# Patient Record
Sex: Male | Born: 1940 | Race: Black or African American | Hispanic: No | Smoking: Never smoker
Health system: Southern US, Community
[De-identification: ages and names within clinical notes are randomized; demographics above are authoritative.]

## PROBLEM LIST (undated history)

## (undated) ENCOUNTER — Emergency Department (HOSPITAL_COMMUNITY)
Admission: EM | Payer: Medicare Other | Source: Home / Self Care | Attending: Emergency Medicine | Admitting: Emergency Medicine

## (undated) DIAGNOSIS — E785 Hyperlipidemia, unspecified: Secondary | ICD-10-CM

## (undated) DIAGNOSIS — I5020 Unspecified systolic (congestive) heart failure: Secondary | ICD-10-CM

## (undated) DIAGNOSIS — IMO0001 Reserved for inherently not codable concepts without codable children: Secondary | ICD-10-CM

## (undated) DIAGNOSIS — Z8701 Personal history of pneumonia (recurrent): Secondary | ICD-10-CM

## (undated) DIAGNOSIS — I480 Paroxysmal atrial fibrillation: Secondary | ICD-10-CM

## (undated) DIAGNOSIS — R7303 Prediabetes: Secondary | ICD-10-CM

## (undated) DIAGNOSIS — I2510 Atherosclerotic heart disease of native coronary artery without angina pectoris: Secondary | ICD-10-CM

## (undated) DIAGNOSIS — D509 Iron deficiency anemia, unspecified: Secondary | ICD-10-CM

## (undated) DIAGNOSIS — I639 Cerebral infarction, unspecified: Secondary | ICD-10-CM

## (undated) DIAGNOSIS — I499 Cardiac arrhythmia, unspecified: Secondary | ICD-10-CM

## (undated) DIAGNOSIS — I252 Old myocardial infarction: Secondary | ICD-10-CM

## (undated) DIAGNOSIS — I77810 Thoracic aortic ectasia: Secondary | ICD-10-CM

## (undated) DIAGNOSIS — Z8673 Personal history of transient ischemic attack (TIA), and cerebral infarction without residual deficits: Secondary | ICD-10-CM

## (undated) DIAGNOSIS — Z01810 Encounter for preprocedural cardiovascular examination: Secondary | ICD-10-CM

## (undated) DIAGNOSIS — I219 Acute myocardial infarction, unspecified: Secondary | ICD-10-CM

## (undated) DIAGNOSIS — I10 Essential (primary) hypertension: Secondary | ICD-10-CM

## (undated) HISTORY — DX: Essential (primary) hypertension: I10

## (undated) HISTORY — DX: Acute myocardial infarction, unspecified: I21.9

## (undated) HISTORY — DX: Atherosclerotic heart disease of native coronary artery without angina pectoris: I25.10

## (undated) HISTORY — DX: Cerebral infarction, unspecified: I63.9

## (undated) HISTORY — DX: Personal history of pneumonia (recurrent): Z87.01

## (undated) HISTORY — DX: Paroxysmal atrial fibrillation: I48.0

## (undated) HISTORY — PX: CARDIAC CATHETERIZATION: SHX172

## (undated) HISTORY — DX: Hyperlipidemia, unspecified: E78.5

## (undated) HISTORY — DX: Unspecified systolic (congestive) heart failure: I50.20

## (undated) HISTORY — DX: Personal history of transient ischemic attack (TIA), and cerebral infarction without residual deficits: Z86.73

## (undated) HISTORY — DX: Old myocardial infarction: I25.2

## (undated) HISTORY — PX: COLONOSCOPY: SHX174

## (undated) HISTORY — DX: Thoracic aortic ectasia: I77.810

## (undated) HISTORY — DX: Iron deficiency anemia, unspecified: D50.9

---

## 1999-02-03 ENCOUNTER — Emergency Department (HOSPITAL_COMMUNITY): Admission: EM | Admit: 1999-02-03 | Discharge: 1999-02-03 | Payer: Self-pay | Admitting: Internal Medicine

## 1999-04-30 ENCOUNTER — Emergency Department (HOSPITAL_COMMUNITY): Admission: EM | Admit: 1999-04-30 | Discharge: 1999-04-30 | Payer: Self-pay | Admitting: Emergency Medicine

## 2002-06-12 ENCOUNTER — Emergency Department (HOSPITAL_COMMUNITY): Admission: EM | Admit: 2002-06-12 | Discharge: 2002-06-12 | Payer: Self-pay | Admitting: Emergency Medicine

## 2002-06-12 ENCOUNTER — Encounter: Payer: Self-pay | Admitting: Emergency Medicine

## 2002-07-20 ENCOUNTER — Ambulatory Visit (HOSPITAL_BASED_OUTPATIENT_CLINIC_OR_DEPARTMENT_OTHER): Admission: RE | Admit: 2002-07-20 | Discharge: 2002-07-20 | Payer: Self-pay | Admitting: Surgery

## 2002-07-20 ENCOUNTER — Encounter (INDEPENDENT_AMBULATORY_CARE_PROVIDER_SITE_OTHER): Payer: Self-pay | Admitting: *Deleted

## 2004-08-11 ENCOUNTER — Emergency Department (HOSPITAL_COMMUNITY): Admission: EM | Admit: 2004-08-11 | Discharge: 2004-08-11 | Payer: Self-pay | Admitting: Family Medicine

## 2006-08-28 ENCOUNTER — Ambulatory Visit: Payer: Self-pay | Admitting: *Deleted

## 2006-08-28 ENCOUNTER — Inpatient Hospital Stay (HOSPITAL_COMMUNITY): Admission: EM | Admit: 2006-08-28 | Discharge: 2006-09-02 | Payer: Self-pay | Admitting: Emergency Medicine

## 2006-10-05 ENCOUNTER — Ambulatory Visit: Payer: Self-pay | Admitting: Cardiology

## 2006-10-05 ENCOUNTER — Ambulatory Visit: Payer: Self-pay

## 2006-10-05 ENCOUNTER — Encounter: Payer: Self-pay | Admitting: Internal Medicine

## 2006-10-07 ENCOUNTER — Ambulatory Visit: Payer: Self-pay | Admitting: Cardiology

## 2006-10-07 LAB — CONVERTED CEMR LAB
ALT: 18 units/L (ref 0–40)
AST: 22 units/L (ref 0–37)
Albumin: 3.4 g/dL — ABNORMAL LOW (ref 3.5–5.2)
Alkaline Phosphatase: 76 units/L (ref 39–117)
BUN: 10 mg/dL (ref 6–23)
Basophils Absolute: 0 10*3/uL (ref 0.0–0.1)
Basophils Relative: 0.3 % (ref 0.0–1.0)
Bilirubin, Direct: 0.1 mg/dL (ref 0.0–0.3)
CO2: 28 meq/L (ref 19–32)
Calcium: 9 mg/dL (ref 8.4–10.5)
Chloride: 106 meq/L (ref 96–112)
Cholesterol: 269 mg/dL (ref 0–200)
Creatinine, Ser: 1 mg/dL (ref 0.4–1.5)
Direct LDL: 212.5 mg/dL
Eosinophils Absolute: 0.1 10*3/uL (ref 0.0–0.6)
Eosinophils Relative: 1.5 % (ref 0.0–5.0)
GFR calc Af Amer: 97 mL/min
GFR calc non Af Amer: 80 mL/min
Glucose, Bld: 99 mg/dL (ref 70–99)
HCT: 38.3 % — ABNORMAL LOW (ref 39.0–52.0)
HDL: 42.5 mg/dL (ref >39.0)
Hemoglobin: 12.3 g/dL — ABNORMAL LOW (ref 13.0–17.0)
Lymphocytes Relative: 38.4 % (ref 12.0–46.0)
MCHC: 32.2 g/dL (ref 30.0–36.0)
MCV: 69.1 fL — ABNORMAL LOW (ref 78.0–100.0)
Monocytes Absolute: 0.4 10*3/uL (ref 0.2–0.7)
Monocytes Relative: 9.3 % (ref 3.0–11.0)
Neutro Abs: 2.3 10*3/uL (ref 1.4–7.7)
Neutrophils Relative %: 50.5 % (ref 43.0–77.0)
Platelets: 172 10*3/uL (ref 150–400)
Potassium: 3.7 meq/L (ref 3.5–5.1)
RBC: 5.54 M/uL (ref 4.22–5.81)
RDW: 13.8 % (ref 11.5–14.6)
Sodium: 141 meq/L (ref 135–145)
Total Bilirubin: 0.9 mg/dL (ref 0.3–1.2)
Total CHOL/HDL Ratio: 6.3
Total Protein: 7.1 g/dL (ref 6.0–8.3)
Triglycerides: 50 mg/dL (ref 0–149)
VLDL: 10 mg/dL (ref 0–40)
WBC: 4.6 10*3/uL (ref 4.5–10.5)

## 2006-10-20 ENCOUNTER — Ambulatory Visit: Payer: Self-pay | Admitting: Gastroenterology

## 2006-10-21 ENCOUNTER — Ambulatory Visit: Payer: Self-pay | Admitting: Cardiology

## 2006-10-21 LAB — CONVERTED CEMR LAB
BUN: 11 mg/dL (ref 6–23)
CO2: 29 meq/L (ref 19–32)
Creatinine, Ser: 1 mg/dL (ref 0.4–1.5)
Glucose, Bld: 92 mg/dL (ref 70–99)
Potassium: 3.9 meq/L (ref 3.5–5.1)

## 2007-03-09 ENCOUNTER — Encounter: Payer: Self-pay | Admitting: Internal Medicine

## 2007-06-06 ENCOUNTER — Emergency Department (HOSPITAL_COMMUNITY): Admission: EM | Admit: 2007-06-06 | Discharge: 2007-06-06 | Payer: Self-pay | Admitting: *Deleted

## 2007-06-09 ENCOUNTER — Emergency Department (HOSPITAL_COMMUNITY): Admission: EM | Admit: 2007-06-09 | Discharge: 2007-06-09 | Payer: Self-pay | Admitting: Emergency Medicine

## 2008-12-31 ENCOUNTER — Inpatient Hospital Stay (HOSPITAL_COMMUNITY): Admission: EM | Admit: 2008-12-31 | Discharge: 2009-01-05 | Payer: Self-pay | Admitting: Emergency Medicine

## 2008-12-31 ENCOUNTER — Ambulatory Visit: Payer: Self-pay | Admitting: Cardiovascular Disease

## 2009-01-01 ENCOUNTER — Ambulatory Visit: Payer: Self-pay | Admitting: Vascular Surgery

## 2009-01-01 ENCOUNTER — Encounter (INDEPENDENT_AMBULATORY_CARE_PROVIDER_SITE_OTHER): Payer: Self-pay | Admitting: *Deleted

## 2009-10-04 ENCOUNTER — Emergency Department (HOSPITAL_COMMUNITY): Admission: EM | Admit: 2009-10-04 | Discharge: 2009-10-04 | Payer: Self-pay | Admitting: Emergency Medicine

## 2010-11-10 LAB — BASIC METABOLIC PANEL WITH GFR
BUN: 17 mg/dL (ref 6–23)
CO2: 28 meq/L (ref 19–32)
Calcium: 9.1 mg/dL (ref 8.4–10.5)
Chloride: 103 meq/L (ref 96–112)
Creatinine, Ser: 1.06 mg/dL (ref 0.4–1.5)
GFR calc non Af Amer: >60 mL/min
Glucose, Bld: 87 mg/dL (ref 70–99)
Potassium: 4 meq/L (ref 3.5–5.1)
Sodium: 137 meq/L (ref 135–145)

## 2010-11-10 LAB — DRUGS OF ABUSE SCREEN W/O ALC, ROUTINE URINE
Amphetamine Screen, Ur: NEGATIVE
Barbiturate Quant, Ur: NEGATIVE
Benzodiazepines.: NEGATIVE
Cocaine Metabolites: NEGATIVE
Creatinine,U: 77.6 mg/dL
Marijuana Metabolite: NEGATIVE
Methadone: NEGATIVE
Opiate Screen, Urine: NEGATIVE
Phencyclidine (PCP): NEGATIVE
Propoxyphene: NEGATIVE

## 2010-11-10 LAB — CBC
HCT: 38.5 % — ABNORMAL LOW (ref 39.0–52.0)
Hemoglobin: 12.2 g/dL — ABNORMAL LOW (ref 13.0–17.0)
MCHC: 31.6 g/dL (ref 30.0–36.0)
MCV: 68.3 fL — ABNORMAL LOW (ref 78.0–100.0)
Platelets: 172 10*3/uL (ref 150–400)
RBC: 5.64 MIL/uL (ref 4.22–5.81)
RDW: 16.4 % — ABNORMAL HIGH (ref 11.5–15.5)
WBC: 8.1 10*3/uL (ref 4.0–10.5)

## 2010-11-10 LAB — BASIC METABOLIC PANEL
BUN: 15 mg/dL (ref 6–23)
CO2: 26 mEq/L (ref 19–32)
Chloride: 106 mEq/L (ref 96–112)
Chloride: 110 mEq/L (ref 96–112)
Creatinine, Ser: 1.13 mg/dL (ref 0.4–1.5)
Creatinine, Ser: 1.14 mg/dL (ref 0.4–1.5)
GFR calc Af Amer: >60 mL/min (ref >60)
Sodium: 140 mEq/L (ref 135–145)

## 2010-11-10 LAB — LIPID PANEL
HDL: 30 mg/dL — ABNORMAL LOW (ref >39)
Total CHOL/HDL Ratio: 9.8 RATIO

## 2010-11-10 LAB — CATECHOLAMINES, FRACTIONATED, URINE, 24 HOUR
Catecholamines T: 122 ug/24hr — ABNORMAL HIGH (ref <100)
Epinephrine 24 Hr Urine: 10 ug/24hr (ref <20)

## 2010-11-10 LAB — CARDIAC PANEL(CRET KIN+CKTOT+MB+TROPI)
CK, MB: 4.5 ng/mL — ABNORMAL HIGH (ref 0.3–4.0)
CK, MB: 4.7 ng/mL — ABNORMAL HIGH (ref 0.3–4.0)
Relative Index: 1.3 (ref 0.0–2.5)
Total CK: 346 U/L — ABNORMAL HIGH (ref 7–232)
Troponin I: 0.04 ng/mL (ref 0.00–0.06)

## 2010-11-10 LAB — METANEPHRINES, URINE, 24 HOUR: Normetanephrine, 24H Ur: 731 mcg/24 h — ABNORMAL HIGH (ref 122–676)

## 2010-11-10 LAB — FOLATE: Folate: >20 ng/mL

## 2010-11-10 LAB — IRON: Iron: 41 ug/dL — ABNORMAL LOW (ref 42–135)

## 2010-11-10 LAB — HOMOCYSTEINE: Homocysteine: 7.5 umol/L (ref 4.0–15.4)

## 2010-11-11 LAB — URINALYSIS, ROUTINE W REFLEX MICROSCOPIC
Glucose, UA: NEGATIVE mg/dL
Nitrite: NEGATIVE
Protein, ur: NEGATIVE mg/dL
pH: 5.5 (ref 5.0–8.0)

## 2010-11-11 LAB — DIFFERENTIAL
Eosinophils Absolute: 0.1 10*3/uL (ref 0.0–0.7)
Monocytes Absolute: 0.6 10*3/uL (ref 0.1–1.0)
Neutrophils Relative %: 52 % (ref 43–77)

## 2010-11-11 LAB — COMPREHENSIVE METABOLIC PANEL
ALT: 22 U/L (ref 0–53)
Alkaline Phosphatase: 65 U/L (ref 39–117)
CO2: 27 mEq/L (ref 19–32)
GFR calc non Af Amer: >60 mL/min (ref >60)
Glucose, Bld: 85 mg/dL (ref 70–99)
Potassium: 3.5 mEq/L (ref 3.5–5.1)
Sodium: 140 mEq/L (ref 135–145)

## 2010-11-11 LAB — POCT CARDIAC MARKERS: Myoglobin, poc: 199 ng/mL (ref 12–200)

## 2010-11-11 LAB — CBC
Hemoglobin: 12.2 g/dL — ABNORMAL LOW (ref 13.0–17.0)
MCHC: 32.1 g/dL (ref 30.0–36.0)
RBC: 5.58 MIL/uL (ref 4.22–5.81)

## 2010-11-11 LAB — PROTIME-INR: Prothrombin Time: 13.6 seconds (ref 11.6–15.2)

## 2010-12-16 NOTE — Consult Note (Signed)
NAMEJO, CERONE NO.:  [id number]   MEDICAL RECORD NO.:  [id number]           PATIENT TYPE:  INP   LOCATION:  3738                         FACILITY:  [facility]   PHYSICIAN:  Kristine Garbe. Ezzard Standing, M.D.DATE OF BIRTH:  08/27/1940   DATE OF CONSULTATION:  DATE OF DISCHARGE:                                 CONSULTATION   REASON FOR CONSULTATION:  Evaluate the patient with polyp or papilloma  in the left maxillary sinus.   BRIEF HISTORY:  [name] is a 70 year old gentleman who has been  admitted for evaluation of recent stroke.  He had an MRI scan that  demonstrated a left maxillary sinus lesion.  I reviewed the MRI scan  with radiologist, and this demonstrates a lesion along the floor of the  left maxillary sinus.  Initially, radiologist thought this might be  vascular but on review appears to be more nonvascular-type chronic  condition, such as, a chronic sinus disease with some thick mucus versus  benign polyp or cyst.  Less likely it represent a papilloma.  Remaining  sinuses were clear.  On discussion with this patient, the patient has no  sinus complaints, no obstruction, no drainage from the sinuses, no cheek  discomfort.   IMPRESSION:  Left maxillary sinus disease, probably chronic.   The patient might benefit from repeat CT scan of the sinus in 2-3 months  for a followup to see if this is progressive or stable, otherwise, is  asymptomatic, and I am not sure anything else needs to be done with  this.  He can follow up in my office in 2-3 months for a recheck if  needed or follow up with the medical doctor.  Office number would be [phone].  The patient will follow up with myself.           ______________________________  Kristine Garbe. Ezzard Standing, M.D.     CEN/MEDQ  D:  01/02/2009  T:  01/03/2009  Job:  [id number]

## 2010-12-16 NOTE — H&P (Signed)
NAME:  [name], [name] NO.:  [id number]   MEDICAL RECORD NO.:  [id number]          PATIENT TYPE:  INP   LOCATION:  3036                         FACILITY:  [facility]   PHYSICIAN:  Sabino Donovan, MD        DATE OF BIRTH:  1941/03/20   DATE OF ADMISSION:  12/31/2008  DATE OF DISCHARGE:                              HISTORY & PHYSICAL   CHIEF COMPLAINT:  Left-sided numbness.   PRIMARY CARE PHYSICIAN:  None.   HISTORY OF PRESENT ILLNESS:  The patient is a 70 year old African  American male with history of hypertension and myocardial infarction,  who presents with a complaint of left-sided numbness.  Reports that it  started yesterday while he was trying to get in a car and felt a funny  feeling over the left side, reports that his left leg feels like that is  swollen, and left arm feels numb.  Otherwise, he denies any vision  changes, dysarthria, facial droops, or any motor weakness.  Able to  walk.  No problem with grip.  He presented to the ER today and at that  time, noted to have blood pressure of systolic 200.   The patient was given labetalol with decreasing blood pressure to 90s.  Currently, he feels that his numbness likely had mildly improved.   PAST MEDICAL HISTORY:  1. Hypertension.  2. MI.  He had a catheterization in 2008 that showed D2 with 30%      narrowing and 23% narrowing in the LAD.  3. History of atrial fibrillation with spontaneous conversion in the      past, unclear why he is not on anticoagulation.   PAST SURGICAL HISTORY:  None.   FAMILY HISTORY:  The patient does not know his relatives.   SOCIAL HISTORY:  Negative x3.   MEDICATIONS:  None.   DRUG ALLERGIES:  None.   REVIEW OF SYSTEMS:  Unremarkable.   PHYSICAL EXAMINATION:  VITAL SIGNS:  Blood pressure 194/100, now 98/57;  temperature 96.6; pulse 72; and respiratory rate 18.  GENERAL:  In no acute distress.  HEENT:  PERRLA.  EOMI.  NECK:  No lymphadenopathy or thyromegaly.  No  JVD.  CHEST:  He has some end-expiratory wheezes.  Otherwise, mild bibasilar  crackles.  ABDOMEN:  Soft, nontender, and nondistended.  Normoactive bowel sounds.  EXTREMITIES:  No clubbing, cyanosis, or edema.  NEUROLOGIC:  Cranial nerves II through XII are intact.  He has decreased  sensation in  the left upper extremity and left lower extremity.  Motor  strength was intact 5/5 in the right upper extremity and right lower  extremity, but 4+/5 in the left upper extremity and left lower  extremity.   LABORATORY DATA:  Sodium 140, potassium 3.5, BUN 11, and creatinine 1.0.  White count 5.2, H and H 12.2 and 38.0, and platelets 185.  Urine panel  was unremarkable.  Chest x-ray shows mild increase in his  cardiomediastinal silhouette.  CT of the head was negative.   IMPRESSION:  The patient is a 70 year old African American male with  history of hypertension and myocardial infarction,  who presents with  hypertensive crisis versus cerebrovascular accident.  1. Left-sided numbness, could be due to cerebrovascular accident      versus hypertensive encephalopathy.  At this time, we will check      MRI and MRA.  His blood pressure resolved and his blood pressure      improved significantly after minimal dose of labetalol drip.  At      this time, we will give aspirin, check fasting lipid and TSH, and      finish CVA workup.  We will also check urine drug screen as well      and follow his blood pressure closely.  2. Hypertension.  The patient presented with extremely elevated high      blood pressure in 200, at one time was noted to be 264/100.      However, results came down to 98/50 after minimal dose of labetalol      drip.  This may be concerning for pheochromocytoma.  If he      continues to have the above blood pressure, this will be pursued.  3. Myocardial infarction.  No EKG changes noted.  At this time, we      will continue to follow with the serial cardiac enzymes to rule out       acute myocardial infarction.  4. Prophylaxis.  PPI and Lovenox.      Sabino Donovan, MD  Electronically Signed     MJ/MEDQ  D:  12/31/2008  T:  01/01/2009  Job:  [id number]

## 2010-12-16 NOTE — Discharge Summary (Signed)
[name], [name] NO.:  [id number]   MEDICAL RECORD NO.:  [id number]          PATIENT TYPE:  INP   LOCATION:  3738                         FACILITY:  [facility]   PHYSICIAN:  Elliot Cousin, M.D.    DATE OF BIRTH:  27-Jul-1941   DATE OF ADMISSION:  12/31/2008  DATE OF DISCHARGE:  01/05/2009                               DISCHARGE SUMMARY   DISCHARGE DIAGNOSES:  1. 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  Aspirin therapy was started.  2. Hypertensive urgency and subsequent malignant hypertension.  3. Left ventricular hypertrophy by 2-D echocardiogram and history on      non-obstructive coronary artery disease.  4. Dyslipidemia.  5. Pneumonia with possible concominant pulmonary edema.  6. Mild iron deficiency anemia.  7. 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  The patient was      evaluated by ENT physician Dr. Ezzard Standing who recommended a followup CT      of his sinus in 2-3 months to assess for progression or stability.   DISCHARGE MEDICATIONS:  1. Zocor 20 mg daily.  2. Aspirin 81 mg daily.  3. Lisinopril 10 mg daily.  4. Metoprolol 50 mg twice daily.  5. Ceftin 500 mg b.i.d. for 3 more days.  6. Hydrochlorothiazide 12.5 mg daily.   DISCHARGE DISPOSITION:  The patient is being discharged to home in  improved and stable condition.  He was advised to follow up with his new  primary care physician Dr. [name] on January 09, 2009, at 10:30  a.m.   CONSULTATIONS:  Kristine Garbe. Ezzard Standing, MD   PROCEDURES PERFORMED:  1. Chest x-ray on January 04, 2009.  The results revealed improved lung      aeration with resolution of pulmonary edema.  2. MRI of the brain on January 01, 2009.  The results revealed punctate      acute/subacute infarct of the right thalamus.  Age advanced atrophy      and  white matter disease.  A 10-mm hypointense lesion in the      posterior aspect of the left maxillary sinus.  This demonstrates      mild enhancement and could represent a small papilloma.  Acute and      chronic left maxillary sinus disease.  3. Chest x-ray on January 01, 2009.  The results revealed progressive air      space process of the right lung.  Question aspiration pneumonia      versus asymmetric edema.  4. CT scan of the head on Dec 31, 2008.  The results revealed negative      for acute intracranial hemorrhage or edema.  5. Chest x-ray on Dec 31, 2008.  The results revealed a stable chest      with interstitial densities that are chronic.  6. A 2-D echocardiogram performed on January 01, 2009.   HISTORY OF PRESENT ILLNESS:  The patient is a 70 year old man with a  past medical history significant for nonobstructive coronary artery  disease and hypertension, who presented to the emergency department on  Dec 31, 2008, with a chief complaint of left-sided numbness.  When he  was evaluated in the emergency department, his blood pressure was  elevated at 194/100, but subsequently it increased to 264/144.  The  patient was given intravenous antihypertensives in the emergency  department.  His EKG revealed normal sinus rhythm with nonspecific T-  wave abnormalities in the inferior leads.  His CT scan of the head  revealed no acute intracranial findings.  He was admitted for further  evaluation and management.   For additional details please see the dictated history and physical.   HOSPITAL COURSE:  ACUTE RIGHT THALAMIC STROKE AND MALIGNANT  HYPERTENSION.  As above, the patient's CT scan of the head revealed no  acute findings.  He was started on intravenous labetalol as needed for  systolic blood pressures greater than 160.  It was reported that when  the patient received intravenous labetalol in the emergency department,  his systolic blood pressure fell to the 90s.  He was not started  on  standing dose antihypertensive therapy until the second hospital day..  Aspirin was started.  For further evaluation, an MRI of the brain and a  2-D echocardiogram were ordered.  The results of the MRI were  significant for an acute right thalamic stroke, a hypointense lesion in  the left maxillary sinus, and age advanced atrophy.  His 2-D  echocardiogram revealed an ejection fraction of 55-65%, moderate LVH,  mild mitral valve regurgitation, and no cardiac source of emboli  identified.  Additional laboratory studies were ordered including a urinalysis, urine  drug screen, lipid profile, cardiac enzymes, and TSH.  His urinalysis  was essentially negative.  His TSH was within normal limits at 0.716.  His fasting lipid profile revealed a total cholesterol of 295,  triglycerides of 100, HDL cholesterol of 30, and LDL cholesterol of 245.  The patient was therefore started on Zocor 20 mg daily.  His cardiac  enzymes were negative.  His urine drug screen was negative.  Apparently,  urine catecholamines were ordered, however, the results were never  obtained.  He was eventually started on metoprolol and lisinopril for  treatment of his malignant hypertension.  The patient's blood pressure  did improve, however, it was not optimally controlled.  There was a  concern about decreasing his blood pressure too quickly in the setting  of an acute stroke.  Currently, his blood pressure is 170/80, on  metoprolol 50 mg b.i.d. and lisinopril 10 mg daily.  Upon discharge,  hydrochlorothiazide was started at 12.5 mg daily.  The patient was  counseled on a low-salt heart healthy diet.  He was evaluated by both  the physical and occupational therapists and per their evaluation, he  did not need any further therapy.  The patient did not demonstrate any  focal unilateral weakness; his only deficit was some residual numbness  on the left side of his face and arm.     MAXILLARY SINUSITIS AND 10-mm LESION.   ENT physician, Dr. Ezzard Standing was  consulted for evaluation.  Per his assessment, the left maxillary sinus  disease was probably chronic.  He reviewed the hypointense lesion with  the radiologist and both agreed that the lesion less likely represented  a papilloma.  His assessment was that it more than likely represented a  benign polyp  or cyst.  However, he recommended a followup CT scan of the  sinus in 2-3 months and if need be, the patient could follow up with  him.  The patient had no complaints of sinusitis during the  hospitalization.    MILD IRON DEFICIENCY ANEMIA.  The patient's hemoglobin ranged from 12-  12.2.  Iron studies were ordered and revealed a folate of greater than  20, vitamin B12 of 692, ferritin of 146, and total iron of 41.  He was  started on a multivitamin with iron.  Given his age and mild anemia, he  would benefit from further evaluation.  This will be deferred to Dr.  Lovell Sheehan.    AIR-SPACE DISEASE, TREATED AS PNEUMONIA (WITH POSSIBLE PULMONARY  EDEMA).  On the second hospital day, the patient became short of breath.  A chest x-ray was ordered and it revealed progressive air space process  of the right lung.  There was a question of aspiration pneumonia versus  asymmetric edema.  The patient was given Lasix intravenously,  empirically.  He was also started on Rocephin and azithromycin.  His  oxygen  saturations remained within normal limits.  Follow up chest x-ray on  January 04, 2009, revealed resolution of pulmonary edema and improved lung  aeration.  The patient has completed 4 days of antibiotic therapy with  Rocephin and azithromycin and he will be discharged to home on 3 more  days of Ceftin therapy.      Elliot Cousin, M.D.  Electronically Signed     DF/MEDQ  D:  01/05/2009  T:  01/06/2009  Job:  [id number]   cc:   [name], M.D.

## 2010-12-19 NOTE — Letter (Signed)
September 22, 2006     RE:  [name], [name]  MRN:  [id number]  /  DOB:  [birth year]   To Whom It May Concern:   [name] is a patient under my care.  He is stable and may return to  work.  He is to be seen in followup in Cardiology at [facility]    Sincerely,      Arturo Morton. Riley Kill, MD, [facility]  Electronically Signed    TDS/MedQ  DD: 09/22/2006  DT: 09/22/2006  Job #: [id number]

## 2010-12-19 NOTE — Cardiovascular Report (Signed)
NAMEEBBIE, CHERRY NO.:  [id number]   MEDICAL RECORD NO.:  [id number]          PATIENT TYPE:  INP   LOCATION:  2923                         FACILITY:  [facility]   PHYSICIAN:  Arturo Morton. Riley Kill, MD, FACCDATE OF BIRTH:  02/19/1941   DATE OF PROCEDURE:  08/31/2006  DATE OF DISCHARGE:                            CARDIAC CATHETERIZATION   INDICATIONS:  Mr. Abarca is a 70 year old gentleman who presents with  palpitations.  He takes no medications.  He was found to be in rapid  atrial fibrillation and was given diltiazem and subsequently converted.  He has not had recurrence of rhythm in the hospital.  His enzymes were  positive.  He was brought to the catheterization laboratory to evaluate  this further.   PROCEDURE:  1. Left heart catheterization.  2. Selective coronary arteriography.  3. Selective left ventriculography.  4. Aortic root aortography.   DESCRIPTION OF PROCEDURE:  The patient was brought to the  catheterization laboratory and prepped and draped in the usual fashion.  Through an anterior puncture, the femoral artery was entered, and a 5-  [place] sheath was placed.  Views of left and right coronary arteries  were obtained in multiple angiographic projections.  Central aortic and  left ventricular pressures were measured with a pigtail.  Ventriculography was performed in the RAO projection.  We then did a  pressure pullback and did an aortic root shot because the aorta appeared  to be generous in size.  He tolerated this without complication.  He did  have a mild degree of oozing at the sheath site, and a 5-French sheath  was upgraded to a 6-French sheath.  He was anticoagulated.  The Integra  and heparin were both stopped.  He was also given hydralazine as well as  some labetalol for his hypertension.  The sheath was sewn into place,  and he was taken to the holding area in satisfactory clinical condition.   HEMODYNAMIC DATA:  1. Central aortic  pressure was 182/91, mean 127.  2. Left ventricular pressure 163/22.  3. There was no gradient or pullback across the aortic valve.   ANGIOGRAPHIC DATA:  1. Ventriculography was done in the RAO projection.  Overall systolic      function appeared preserved.  Overall LV function was vigorous.      Ejection fraction was in excess of 60%.  2. The aortic root did not demonstrate significant aortic      regurgitation.  There was no obvious evidence of dissection.  There      was some mild dilatation of the aortic root although it did not      appear to be substantially enlarged.  3. The patient's coronary system was left dominant.  4. The left main was free of critical disease.  5. The LAD coursed to the apex.  There was a first diagonal without      significant narrowing.  The second diagonal had about 30% ostial      narrowing, and there was 20-30% narrowing in the LAD just after      this.  High-grade stenosis was not  noted, and the runoff into the      distal LAD was excellent.  6. The circumflex was a dominant vessel.  There was a large major      marginal branch that bifurcated.  The distal vessel consisted of 2      posterolateral branches and a small posterior descending branch.      There was minor luminal irregularity.  7. The right coronary was nondominant and without critical narrowing.   CONCLUSION:  1. Rapid atrial fibrillation, new-onset in etiology.  2. Positive enzymes, probably secondary to #1.  3. Preserved overall left ventricular function.  4. No critical coronary artery disease.   DISPOSITION:  The patient will need to be treated with likely beta  blockers.  With regard to recurrent atrial fibrillation, he is at some  increase in risk although not at the highest risk.  He is not obviously  diabetic nor severely hypertensive.  Whether or not he is a good  candidate for Coumadin anticoagulation is unclear at the present time.  He will need long-term primary  care.      Arturo Morton. Riley Kill, MD, [facility]  Electronically Signed     TDS/MEDQ  D:  08/30/2006  T:  08/31/2006  Job:  [id number]   cc:   Arturo Morton. Riley Kill, MD, [facility]  CV Laboratory

## 2010-12-19 NOTE — Assessment & Plan Note (Signed)
[address] [facility]                            CARDIOLOGY OFFICE NOTE   NAME:[name], [name]                     MRN:          [id number]  DATE:10/05/2006                            DOB:          [birth year]    [name] is in for a follow-up visit.  He was admitted to the  hospital about a week after a funeral for his niece.  At that time he  was noted to have mild anemia.  He was also found to be in atrial  fibrillation.  He was hypertensive.  He admits to not taking medicines  very well in the past.  He had 11% iron saturation although his ferritin  was relatively normal.  He was placed on blood pressure medications.  Because of some of the symptoms, he underwent cardiac catheterization.  The catheterization study was set up by Dr. Andee Lineman.  Overall LV function  was vigorous with an ejection fraction of 60%.  There was no evidence of  aortic dissection.  There was some mild dilatation of the aortic root,  although it did not appear to be substantial.  The patient's coronary  system was left-dominant.  There was 20-30% narrowing in the LAD system.  There was not significant circumflex disease.  The right coronary was  nondominant.  Since discharge from the hospital he has been stable and  feeling relatively well.  His enzymes were mildly elevated during the  hospitalization.  His LDL was as high as 184.  We also gave him stool  cards at the time of discharge, although he has not returned them.   MEDICATIONS:  1. Lisinopril 10 mg daily.  2. Labetalol 200 mg p.o. b.i.d.  3. Lipitor 80 mg daily.  4. Aspirin 81 mg daily.   PHYSICAL EXAMINATION:  VITAL SIGNS:  On physical examination today the  blood pressure is 170/100.  When I checked it was 165-170 with a  diastolic of about 90.  They were equal bilaterally.  Pulse was 76.  LUNGS:  The lung fields were clear.  CARDIAC:  The cardiac rhythm was regular.  There was an S4 gallop.  ABDOMEN:  Soft.  EXTREMITIES:  The extremities did not reveal significant edema.  RECTAL:  Rectal exam was done and it was Hemoccult-negative.   IMPRESSION:  1. Paroxysmal atrial fibrillation, brief and resolved.  2. Hypertension with poor control.  3. Microcytic anemia of uncertain etiology.  4. Systemic hypertension, poor control.  5. Hypercholesterolemia.   PLAN:  1. Check lipid and liver profile.  2. Recheck CBC.  3. Check basic metabolic profile.  4. Start hydrochlorothiazide 12.5 mg daily.  5. Try to obtain primary care for the patient going forward.  6. Referral to GI for evaluation of the patient's microcytic anemia.   I reviewed these issues with the patient in detail.  He understands that  he does not have a primary care physician and we will try to do what we  can to arrange this.  We will start hydrochlorothiazide at a fairly low  dose to try to improve on blood pressure control.  We  will see him back  in 2 weeks to recheck his blood pressure, and I will also get a basic  metabolic profile at that time.     [name]. Riley Kill, MD, [facility]  Electronically Signed    TDS/MedQ  DD: 10/05/2006  DT: 10/06/2006  Job #: [id number]

## 2010-12-19 NOTE — Discharge Summary (Signed)
[name], [name] NO.:  [id number]   MEDICAL RECORD NO.:  [id number]          PATIENT TYPE:  INP   LOCATION:  2037                         FACILITY:  [facility]   PHYSICIAN:  Craig Morton. Riley Kill, MD, FACCDATE OF BIRTH:  21-Oct-1940   DATE OF ADMISSION:  08/28/2006  DATE OF DISCHARGE:  09/02/2006                               DISCHARGE SUMMARY   No primary care physician.  Follow-up cardiologist will be Dr. Riley [name].   DISCHARGE DIAGNOSES:  1. Non-ST elevated myocardial infarction with noncritical coronary      artery disease status post cardiac catheterization this admission      on August 30, 2006.  2. Paroxysmal atrial fibrillation.  3. Microcytic anemia with iron studies pending.   PAST MEDICAL HISTORY:  Removal of a cyst on the patient's. Back.  The  patient denies other medical history.   HOSPITAL COURSE:  [name] is a 70 year old African American  gentleman who presented on day of admission stating he was at [employer] eating dinner when he had sudden onset of palpitations.  The patient became diaphoretic.  Apparently his friend gave him a  nitroglycerin and called 911.  When EMS arrived they noticed the patient  was pale and ashy.  He was noted to be in atrial fib with a heart rate  in the 180s.  He was given Cardizem 20 mg IV and started to feel better.  Heart rate decreased to the 90s.  In the emergency room the patient  found be in atrial fib/flutter at a rate of 86.  Cardiac enzymes were  obtained.  First troponin was 0.07 seconds and 0.24.  D-dimer was  negative.  A 12 lead EKG obtained showed sinus rhythm with a rate of 90,  lateral ST-T wave depression with T-wave inversions.  The patient was  admitted to step-down and started on heparin, started on beta blocker,  aspirin, statin.  Further blood work was ordered with plans to proceed  with cardiac catheterization to evaluate coronaries.  The patient was  also started on IIB-IIIA.   The patient's hemoglobin noted to B12.  No  active bleeding.  The patient to the cath lab on August 30, 2006.  EF  greater than 60%.  No critical coronary artery disease per Dr. Riley [name].  The patient tolerated the procedure without complications.   On August 31, 2006 the patient's hemoglobin was 11.9, hematocrit 37.5,  potassium 3.6, BUN and creatinine stable at 8 and 1.05.  EKG showed  normal sinus rhythm.  The patient tolerating beta blocker, aspirin,  statin, ACE inhibitor.  Blood pressure is still elevated at 160/72.  Cardiac rehab in to work with the patient/education.  On September 01, 2006 the patient's H&H was 11.2 and  35.3.  Cath site stable.  The  patient maintained normal sinus rhythm.  The patient anxious about  discharge.  Dr. Riley [name] in to talk with the patient.  On the day of  discharge the patient is stable.  No chest pain.  Telemetry; no atrial  fib in greater than 24 hours.  Blood pressure 157/89.  Hemoglobin  maintaining at 11.8.  The patient being discharged home to follow up  with Dr. Riley [name].   LAB WORK PRIOR TO DISCHARGE:  H&H 11.8 and 36.4, platelet count 179,000,  potassium 3.6, BUN 8, creatinine 1.05, hemoglobin A1c 6.0.  Total  cholesterol 234, lipids 60, HDL 38, LDL 184.  TSH 1.097.  Drug screen  negative.   DISCHARGE INSTRUCTIONS:  He has been given the post cardiac  catheterization discharge instructions with an appointment to follow up  with Dr. Riley [name] September 07, 2006 at 10:00 a.m. I have scheduled him for  a 2-D echocardiogram at 8:30 a.m. on the same day.  The patient at  discharge also received Hemoccult stool cards to return to Dr. Riley [name].  He will need liver lipids in 6 weeks and iron studies per Dr. Riley [name].   MEDICATIONS AT DISCHARGE:  1. Lisinopril 10 mg.  2. Aspirin 81.  3. Lipitor 80.  4. Labetalol 200 mg b.i.d.   The patient has also received a prescription for nitroglycerin p.r.n.  At the time of discharge iron study is pending.   The patient is asked to  establish care with a primary care physician.  Duration of discharge  encounter is 45 minutes.      Dorian Pod, ACNP      Craig Morton. Riley Kill, MD, [facility]  Electronically Signed    MB/MEDQ  D:  09/02/2006  T:  09/02/2006  Job:  [id number]

## 2010-12-19 NOTE — Assessment & Plan Note (Signed)
[facility]                            CARDIOLOGY OFFICE NOTE   NAME:Mondor, AZREAL STTHOMAS                     MRN:          [id number]  DATE:10/21/2006                            DOB:          Dec 13, 1940    Mr. Bochicchio is in for a followup visit.  In general, he has been  relatively stable.  He has been back to work at [employer].  The patient has  not gotten a primary care physician, but he has been able to be seen by  the gastroenterologists, and they are getting stool cards, and also  setting him up for endoscopy.  The patient had mild anemia.   MEDICATIONS:  1. Lisinopril 10 mg daily.  2. Labetalol 200 mg b.i.d.  3. Lipitor 80 mg daily.  4. Aspirin 81 mg daily.   Importantly, the patient tells me that he does not want to lie, and that  he does miss his Lipitor at times.  His most recent lipid profile did  reveal an LDL in the mid 200 ranges, and he reportedly said that he was  on his drug therapy.  Also, laboratory studies done on him did reveal a  hemoglobin of 12.3 and a hematocrit of 38, white cell count was 4600,  BUN 10, creatinine 1.0.   Recent echocardiogram was also done.  This revealed normal overall left  ventricular function with normal systolic function, but increased wall  thickness.  This is compatible with hypertensive heart disease.   PHYSICAL:  Blood pressure is 160/89, pulse is 73.  LUNGS:  Fields are clear.  CARDIAC:  Rhythm is regular.   ELECTROCARDIOGRAM:  Was not done today.   IMPRESSION:  1. Hypertensive heart disease without completely controlled response.  2. Iron deficiency anemia.  3. Questionable medication compliance.   PLAN:  1. We will increase his lisinopril to 20 mg daily.  I have asked him      to increase his 10 mg to 2 tablets, and when he runs out to go to      1.  I went over this on several occasions to make sure that he      truly understood the directions.  2. We will recheck a basic metabolic  profile.  3. We will try to arrange a primary care physician for him if it can      be found (Dr Fabian Sharp).  4. He will continue to followup with gastroenterologists.  5. I reinforced the need for him to take his Lipitor.     Arturo Morton. Riley Kill, MD, [facility]  Electronically Signed    TDS/MedQ  DD: 10/21/2006  DT: 10/21/2006  Job #: [id number]

## 2010-12-19 NOTE — H&P (Signed)
NAME:  [name], [name] NO.:  [id number]   MEDICAL RECORD NO.:  [id number]          PATIENT TYPE:  EMS   LOCATION:  MAJO                         FACILITY:  [facility]   PHYSICIAN:  Learta Codding, MD,FACC DATE OF BIRTH:  [birth year]   DATE OF ADMISSION:  08/28/2006  DATE OF DISCHARGE:                              HISTORY & PHYSICAL   The patient's primary physician is none.  The patient's cardiologist is  none.   CHIEF COMPLAINT:  Palpitations.   HISTORY OF PRESENT ILLNESS:  This is a 70 year old gentleman with no  significant prior medical history nor coronary risk factors who states  that this evening he was at [employer] dinner with  his friend when he had the sudden onset of palpitations.  He became  diaphoretic but denies any nausea, vomiting or overt chest pain.  He  also denies any shortness of breath.  He looked pale and diaphoretic.  He was given one nitroglycerin of his friends and EMS was called.  When  EMS arrived on the scene, they noted that he was pale and ashy and was  noted to be atrial fibrillation with a heart rate in the 180s.  He was  given Cardizem 20 mg IV and the patient started to feel much better and  his heart rate came down into the 90s.   In the emergency room, a 12-lead EKG noted that the patient was in  atrial fibrillation/atrial flutter at a rate of 86.  He continues to  deny any chest pain or palpations at this point and time.  However, his  first set of cardiac markers were positive.   At baseline, the patient denies any exertional angina to me.  He denies  any orthopnea, PND, or lower extremity edema.  He does state that every  few weeks he does get the onset of palpitations while working.  He has  never experienced them at rest.  They usually last less than 5 minutes  and usually resolve with rest.  He has never had any syncope or  presyncope nor any TIA or stroke-like symptoms.   PAST MEDICAL HISTORY:  The  patient has had a cyst on his back removed in  the past.   ALLERGIES:  None.   MEDICATIONS:  None.   SOCIAL HISTORY:  The patient lives alone.  He denies any habits.   FAMILY HISTORY:  Negative for any family history of coronary disease,  hypertension or diabetes.   REVIEW OF SYSTEMS:  As above in the HPI, otherwise remaining 18-point  review of systems is negative.   PHYSICAL EXAMINATION:  Temperature of 97.6, pulse of 82, respiratory  rate of 18, blood pressure is 126/75.  O2 saturation is 98% on room air.  In general, he is alert and oriented x3, no acute distress.  HEENT EXAM:  Normocephalic and atraumatic.  Sclerae anicteric.  Pupils  equal, round and react to light.  Oropharynx is clear.  Neck is supple  with no lymphadenopathy.  JVP is flat.  No carotid bruits with 2+  carotid impulses, symmetric bilaterally.  LUNGS:  Are clear to auscultation bilaterally without wheezes, rhonchi  or rales.  CARDIOVASCULAR EXAM:  Regular rate and rhythm.  Normal S1-S2 without any  murmurs, rubs or gallops.  ABDOMEN EXAM:  Positive bowel sounds, soft, nontender, and nondistended  without any palpable masses or hepatosplenomegaly.  EXTREMITY EXAM:  There are 2+ radial and posterior tibialis pulses  symmetric bilaterally without any lower extremity edema, clubbing or  cyanosis.  NEUROLOGIC EXAM:  Cranial nerves III through XII are intact.  The  patient has 5/5 motor strength throughout.   LABORATORY DATA:  Chest x-ray is pending.  A 12-lead EKG shows a normal  sinus rhythm at a rate of 90 with a normal axis and normal intervals.  He had some lateral ST-T wave depression with T-wave inversions.  He has  no Q-waves.  White count of 5.2, hematocrit of 41, platelets of 178,  potassium of 3.8, bicarb of 22, creatinine 1.3, glucose 121.  First CK-  MB was 5.8, the second was 8.1.  First troponin was 0.07, the second was  0.24.  A d-dimer was negative.   ASSESSMENT:  1. Non-ST elevation  myocardial infarction.  2. Atrial fibrillation.   RECOMMENDATIONS:  The patient will be admitted to a stepdown bed.  He  has already been started on heparin in the emergency room.  I will start  him on beta blockers, aspirin, statin, IIB III1 and beta blockers at  this point in time for treatment of his non-ST elevation MI.  We will  check a TSH and LFTs also in the morning as well as a hemoglobin A1c.  We will follow his cardiac enzymes and he will get nitroglycerin if he  any further chest discomfort.  He will need cardiac catheterization on  Monday to evaluate his coronary anatomy.  Most likely this could be a  right coronary lesion given presentation with an atrial arrhythmia.  However, we will have to confirm this with a cath.     ______________________________  [name]. Craig Burger, MD      Learta Codding, MD,FACC  Electronically Signed    BRS/MEDQ  D:  08/28/2006  T:  08/28/2006  Job:  [id number]

## 2010-12-19 NOTE — Assessment & Plan Note (Signed)
[facility]                            CARDIOLOGY OFFICE NOTE   NAME:Juday, SAMIEL PEEL                     MRN:          [id number]  DATE:10/21/2006                            DOB:          [birth year]    HISTORY OF PRESENT ILLNESS:  Mr.  Rao is in for follow up.  In  general, he has been back to work and he has been stable.  He has not  had any of the events that led to the hospitalization.  We have been  attempting to get him in to see the gastroenterology service, given his  initial anemia.  His ferritin was normal, but his iron saturation was  11%, and his initial hemoglobin on admission was 11.8.   He is continuing to work at [employer].   MEDICATIONS:  1. Labetalol 200 mg p.o. b.i.d.  2. Lisinopril 10 mg daily.  3. Lipitor 80 mg daily.  4. Aspirin 81 mg daily.   PHYSICAL EXAMINATION:  VITAL SIGNS:  Blood pressure 160/90, pulse 73.  CARDIAC:  Unchanged.   CLINICAL COURSE:  We talked about various treatment options at the  present time.  I plan to increase his Lisinopril to 20 mg daily.  We  will have him return to the clinic in about four weeks in follow up.  Importantly, his most recent laboratory studies have revealed an EF of  55-60% with increased wall thickness compatible with poorly controlled  hypertension over a moderate period of time.  Aortic valve was mildly  calcified.  There was a suggestion of pulmonary regurgitation, but I  could not hear much on exam.   Most recent laboratory studies did reveal a hemoglobin of 12.3.  His BUN  and creatinine are normal.   We plan to get a repeat B-met today.  I will await to see what has  happened with his GI evaluation.  An upper endoscopy probably needs to  be done at this point.   As an addendum, I stressed to him the need for good blood pressure  control.     Arturo Morton. Riley Kill, MD, [facility]  Electronically Signed    TDS/MedQ  DD: 11/01/2006  DT: 11/01/2006  Job #: [id number]

## 2010-12-19 NOTE — Assessment & Plan Note (Signed)
[facility]                         GASTROENTEROLOGY OFFICE NOTE   NAME:[name], [name]                     MRN:          [id number]  DATE:10/20/2006                            DOB:          02/13/1941    REFERRING PHYSICIAN:  Arturo Morton. Riley Kill, MD, [facility]   REASON FOR REFERRAL:  Iron deficiency anemia.   HISTORY OF PRESENT ILLNESS:  [name] is a 70 year old African-  American male referred through the courtesy of Dr. Riley Kill.  He was  hospitalized from January 26 through January 31 at [facility]  for a non-ST-segment elevated myocardial infarction.  He was found to  have non-critical coronary disease by cardiac catheterization.  Paroxysmal atrial fibrillation was documented in the hospital, but he  returned to sinus rhythm.  He was noted to have a microcytic anemia  during his hospitalization.  His most recent hemoglobin was 12.3 with an  MCV of 69.1.  Iron studies showed a saturation of 11%, iron at 32 and  TIBC at 297.  He has no gastrointestinal complaints and specifically  denies any abdominal pain, weight loss, reflux symptoms, diarrhea,  constipation, change in stool caliber, melena, or hematochezia.  He does  have frequent nocturnal urination.  He does not have a primary care  physician, but he does have an appointment with Lacey Jensen on  April 22.  There is no family history of colon cancer, colon polyps, or  inflammatory bowel disease.   PAST MEDICAL HISTORY:  Hypertension.  Coronary artery disease status  post myocardial infarction August 28, 2006.  Hyperlipidemia.   CURRENT MEDICATIONS:  Listed on the chart, updated, and reviewed.   MEDICATION ALLERGIES:  None known.   SOCIAL HISTORY AND REVIEW OF SYSTEMS:  Per the hand-written form.   PHYSICAL EXAM:  Overweight.  In no acute distress.  Height 5 feet 6 inches, weight 197.2 pounds, blood pressure 150/88,  pulse 84 and regular.  HEENT:  Anicteric sclerae.   Oropharynx clear.  NECK:  Without thyromegaly or adenopathy appreciated.  CHEST:  Clear to auscultation bilaterally.  CARDIAC:  Regular rate and rhythm without murmurs appreciated.  ABDOMEN:  Soft, nontender, nondistended.  Normoactive bowel sounds.  No  palpable organomegaly, masses, or hernias.  RECTAL:  Deferred until time of colonoscopy.  EXTREMITIES:  Without cyanosis, clubbing, or edema.  NEUROLOGIC:  Alert and oriented x3.  Grossly nonfocal.   ASSESSMENT AND PLAN:  Iron deficiency anemia.  Rule out occult  gastrointestinal losses versus other causes.  Obtain stool Hemoccults.  Schedule colonoscopy and upper endoscopy.  Risks, benefits, and  alternatives to colonoscopy and possible biopsy and possible  polypectomy,  and upper endoscopy with possible biopsy discussed with the patient, and  he consents to proceed.  The procedures will be scheduled electively.     Venita Lick. Russella Dar, MD, [facility]  Electronically Signed    MTS/MedQ  DD: 10/20/2006  DT: 10/20/2006  Job #: [id number]   cc:   Arturo Morton. Riley Kill, MD, [facility]

## 2011-03-03 ENCOUNTER — Emergency Department (HOSPITAL_COMMUNITY): Payer: Medicare Other

## 2011-03-03 ENCOUNTER — Emergency Department (HOSPITAL_COMMUNITY)
Admission: EM | Admit: 2011-03-03 | Discharge: 2011-03-03 | Disposition: A | Discharge status: Home / Self Care | Payer: Medicare Other | Attending: Emergency Medicine | Admitting: Emergency Medicine

## 2011-03-03 DIAGNOSIS — I10 Essential (primary) hypertension: Secondary | ICD-10-CM | POA: Insufficient documentation

## 2011-03-03 DIAGNOSIS — I252 Old myocardial infarction: Secondary | ICD-10-CM | POA: Insufficient documentation

## 2011-03-03 LAB — URINALYSIS, ROUTINE W REFLEX MICROSCOPIC
Ketones, ur: NEGATIVE mg/dL
Leukocytes, UA: NEGATIVE
Nitrite: NEGATIVE
Urobilinogen, UA: 0.2 mg/dL (ref 0.0–1.0)
pH: 6 (ref 5.0–8.0)

## 2011-03-03 LAB — POCT I-STAT, CHEM 8
Creatinine, Ser: 1.1 mg/dL (ref 0.50–1.35)
HCT: 41 % (ref 39.0–52.0)
Hemoglobin: 13.9 g/dL (ref 13.0–17.0)
Potassium: 3.9 mEq/L (ref 3.5–5.1)
Sodium: 142 mEq/L (ref 135–145)
TCO2: 26 mmol/L (ref 0–100)

## 2011-03-03 LAB — TROPONIN I: Troponin I: <0.3 ng/mL (ref <0.30)

## 2011-03-03 LAB — CBC
Hemoglobin: 12.2 g/dL — ABNORMAL LOW (ref 13.0–17.0)
MCHC: 31.5 g/dL (ref 30.0–36.0)
Platelets: 153 10*3/uL (ref 150–400)
RDW: 16.4 % — ABNORMAL HIGH (ref 11.5–15.5)

## 2011-03-30 ENCOUNTER — Encounter: Payer: Self-pay | Admitting: Cardiology

## 2011-03-31 ENCOUNTER — Encounter: Payer: Self-pay | Admitting: Cardiology

## 2011-03-31 ENCOUNTER — Ambulatory Visit (INDEPENDENT_AMBULATORY_CARE_PROVIDER_SITE_OTHER): Payer: Medicare Other | Admitting: Cardiology

## 2011-03-31 VITALS — BP 154/101 | HR 84 | Resp 14 | Ht 66.0 in | Wt 186.0 lb

## 2011-03-31 DIAGNOSIS — I6350 Cerebral infarction due to unspecified occlusion or stenosis of unspecified cerebral artery: Secondary | ICD-10-CM

## 2011-03-31 DIAGNOSIS — K4090 Unilateral inguinal hernia, without obstruction or gangrene, not specified as recurrent: Secondary | ICD-10-CM | POA: Insufficient documentation

## 2011-03-31 DIAGNOSIS — I639 Cerebral infarction, unspecified: Secondary | ICD-10-CM | POA: Insufficient documentation

## 2011-03-31 DIAGNOSIS — I10 Essential (primary) hypertension: Secondary | ICD-10-CM | POA: Insufficient documentation

## 2011-03-31 NOTE — Patient Instructions (Signed)
Your physician has recommended you bring your medications to the clinic tomorrow when you get your labs done  Your physician recommends that you return for lab work in: tomorrow, fasting (nothing to eat or drink after midnight) BMET,Lipid, Liver   Your physician recommends that you schedule a follow-up appointment in: one week

## 2011-03-31 NOTE — Assessment & Plan Note (Signed)
Not well controlled, and we are unsure as to his meds.  CXR did reveal some interstitial edema, and this is not the first time he has done this.  He needs to come back tomorrow, have some labs done, and bring [place] his meds.  He remains high, but we need to know what he is on at this point in time.

## 2011-03-31 NOTE — Assessment & Plan Note (Signed)
Exam suggests a reducable hernia.  He should see a Development worker, international aid, and we will discuss at next OV.

## 2011-03-31 NOTE — Progress Notes (Signed)
HPI:  [name] was last seen by me back in 2008.  He recently presented to the ER at [facility].  He had hypertension with pulmonary edema, and was treated.  He was started back on medications.  Unfortunately, he left them at home.  He is unsure as to the doses.  He has not been able to get up with a primary care physician.  He has not had chest pain.  He lives alone and has not been eating all that well.  He also mentions that ED may be an issue.  He also complains of a knot in his left groin that is sometimes in and sometimes out.  He just stopped taking his medications until he felt bad leading him to the ER.  He noticed it one day while at the [facility].  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   The patient previously presented with atrial fib with RVR back in 2008, and then in 2010 presented with a hypertensive emergency and thalamic stroke.    Current Outpatient Prescriptions  Medication Sig Dispense Refill  . aspirin 81 MG tablet Take 81 mg by mouth daily.        . cefUROXime (CEFTIN) 500 MG tablet Take 500 mg by mouth 2 (two) times daily.        . hydrochlorothiazide 50 MG tablet Take 25 mg by mouth daily.       Marland Kitchen lisinopril (PRINIVIL,ZESTRIL) 10 MG tablet Take 10 mg by mouth daily.        Marland Kitchen METOPROLOL TARTRATE PO Take by mouth. 1 TAB TWICE A DAY       . simvastatin (ZOCOR) 20 MG tablet Take 20 mg by mouth at bedtime.          No Known Allergies  Past Medical History  Diagnosis Date  . Hypertension   . Dyslipidemia     No past surgical history on file.  No family history on file.  History   Social History  . Marital Status: Widowed    Spouse Name: N/A    Number of Children: N/A  . Years of Education: N/A   Occupational History  . Not on file.   Social History Main Topics  . Smoking status: Never Smoker   . Smokeless tobacco: Not on file  . Alcohol Use: Not on file  . Drug Use: Not on file  . Sexually Active: Not on file    Other Topics Concern  . Not on file   Social History Narrative  . No narrative on file    ROS: Please see the HPI.  All other systems reviewed and negative.  PHYSICAL EXAM:  BP 154/101  Pulse 84  Resp 14  Ht 5\' 6"  (1.676 m)  Wt 186 lb (84.369 kg)  BMI 30.02 kg/m2  General: Well developed, well nourished, in no acute distress. Head:  Normocephalic and atraumatic. Neck: no JVD Lungs: Clear to auscultation and percussion. Heart: Normal S1 and S2.  No murmur, rubs or gallops.  Abdomen:  Normal bowel sounds; soft; non tender; no organomegaly.  Possible Left inguinal hernia. Pulses: Pulses normal in all 4 extremities. Extremities: No clubbing or cyanosis. No edema. Neurologic: Alert and oriented x 3.  EKG:  See recent [facility] tracing in EPIC.   ASSESSMENT AND PLAN:

## 2011-03-31 NOTE — Assessment & Plan Note (Signed)
No current symptoms.  Still not good about medication compliance.  He will bring meds in tomorrow and we will make adjustments.  He does need primary care.

## 2011-04-01 ENCOUNTER — Ambulatory Visit (INDEPENDENT_AMBULATORY_CARE_PROVIDER_SITE_OTHER): Payer: Medicare Other | Admitting: *Deleted

## 2011-04-01 DIAGNOSIS — I639 Cerebral infarction, unspecified: Secondary | ICD-10-CM

## 2011-04-01 DIAGNOSIS — I10 Essential (primary) hypertension: Secondary | ICD-10-CM

## 2011-04-01 DIAGNOSIS — I6350 Cerebral infarction due to unspecified occlusion or stenosis of unspecified cerebral artery: Secondary | ICD-10-CM

## 2011-04-01 DIAGNOSIS — K4090 Unilateral inguinal hernia, without obstruction or gangrene, not specified as recurrent: Secondary | ICD-10-CM

## 2011-04-01 LAB — BASIC METABOLIC PANEL
Calcium: 9.2 mg/dL (ref 8.4–10.5)
Creatinine, Ser: 1.1 mg/dL (ref 0.4–1.5)
Sodium: 141 mEq/L (ref 135–145)

## 2011-04-01 LAB — LIPID PANEL
Cholesterol: 260 mg/dL — ABNORMAL HIGH (ref 0–200)
HDL: 39.8 mg/dL (ref >39.00)
Total CHOL/HDL Ratio: 7
Triglycerides: 110 mg/dL (ref 0.0–149.0)
VLDL: 22 mg/dL (ref 0.0–40.0)

## 2011-04-01 LAB — HEPATIC FUNCTION PANEL
Bilirubin, Direct: 0 mg/dL (ref 0.0–0.3)
Total Bilirubin: 0.3 mg/dL (ref 0.3–1.2)

## 2011-04-01 LAB — LDL CHOLESTEROL, DIRECT: Direct LDL: 200.5 mg/dL

## 2011-04-01 NOTE — Progress Notes (Signed)
Addended by: Scherrie Bateman E on: 04/01/2011 11:54 AM   Modules accepted: Orders

## 2011-04-09 ENCOUNTER — Encounter: Payer: Self-pay | Admitting: Cardiology

## 2011-04-09 ENCOUNTER — Ambulatory Visit (INDEPENDENT_AMBULATORY_CARE_PROVIDER_SITE_OTHER): Payer: Medicare Other | Admitting: Cardiology

## 2011-04-09 DIAGNOSIS — E7800 Pure hypercholesterolemia, unspecified: Secondary | ICD-10-CM

## 2011-04-09 DIAGNOSIS — I10 Essential (primary) hypertension: Secondary | ICD-10-CM

## 2011-04-09 DIAGNOSIS — E785 Hyperlipidemia, unspecified: Secondary | ICD-10-CM

## 2011-04-09 DIAGNOSIS — I5032 Chronic diastolic (congestive) heart failure: Secondary | ICD-10-CM

## 2011-04-09 DIAGNOSIS — I509 Heart failure, unspecified: Secondary | ICD-10-CM

## 2011-04-09 MED ORDER — ATORVASTATIN CALCIUM 10 MG PO TABS: 20.0000 mg | ORAL_TABLET | Freq: Every day | ORAL | Status: DC

## 2011-04-09 MED ORDER — AMLODIPINE BESYLATE 5 MG PO TABS: 5.0000 mg | ORAL_TABLET | Freq: Every day | ORAL | Status: DC

## 2011-04-09 MED ORDER — LISINOPRIL 10 MG PO TABS: 20.0000 mg | ORAL_TABLET | Freq: Every day | ORAL | Status: DC

## 2011-04-09 NOTE — Progress Notes (Signed)
HPI:  He again did not bring in his medications, despite our instructions.  I carefully went over things with him in detail about need to bring these in, as he is confused about his medications.    Current Outpatient Prescriptions  Medication Sig Dispense Refill  . castor oil liquid Take by mouth. AS DIRECTED       . hydrochlorothiazide 50 MG tablet Take 50 mg by mouth daily. 1/2 TAB EVER DAY      . lisinopril (PRINIVIL,ZESTRIL) 10 MG tablet Take 2 tablets (20 mg total) by mouth daily.  30 tablet  11  . Sennosides (EX-LAX PO) Take by mouth. AS DIRECTED PRN       . amLODipine (NORVASC) 5 MG tablet Take 1 tablet (5 mg total) by mouth daily.  30 tablet  11  . atorvastatin (LIPITOR) 10 MG tablet Take 2 tablets (20 mg total) by mouth daily.  60 tablet  11    No Known Allergies  Past Medical History  Diagnosis Date  . Hypertension   . Dyslipidemia   . Stroke     Past Surgical History  Procedure Date  . Cardiac catheterization     No family history on file.  History   Social History  . Marital Status: Widowed    Spouse Name: N/A    Number of Children: N/A  . Years of Education: N/A   Occupational History  . Not on file.   Social History Main Topics  . Smoking status: Never Smoker   . Smokeless tobacco: Not on file  . Alcohol Use: Not on file  . Drug Use: Not on file  . Sexually Active: Not on file   Other Topics Concern  . Not on file   Social History Narrative  . No narrative on file    ROS: Please see the HPI.  All other systems reviewed and negative.  PHYSICAL EXAM:  BP 196/103  Pulse 73  Ht 5' 6.5" (1.689 m)  Wt 190 lb 6.4 oz (86.365 kg)  BMI 30.27 kg/m2  General: Well developed, well nourished, in no acute distress. Head:  Normocephalic and atraumatic. Neck: no JVD Lungs: Clear to auscultation and percussion. Heart: Normal S1 and S2.  No murmur, rubs or gallops.  Abdomen:  Normal bowel sounds; soft; non tender; no organomegaly Pulses: Pulses normal  in all 4 extremities. Extremities: No clubbing or cyanosis. No edema. Neurologic: Alert and oriented x 3.  EKG:  NSR.  Leftward axis.  LVH.  Lateral T inversion consistent with mostly strain.   Ischemia cannot be excluded.    ASSESSMENT AND PLAN:

## 2011-04-09 NOTE — Patient Instructions (Addendum)
Your physician recommends that you schedule a follow-up appointment in:  2 weeks with Dr. Riley Kill. Your physician has recommended you make the following change in your medication:  Lisinopril 20 mg take one tablet by mouth daily, Amlodipine 5 mg take one tablet by mouth daily, Atorovastatin 20 mg ( Take 2/10 mg tablet once a day) Patient will bring  Medications next appointment date. Stop taking Ibuprofen.

## 2011-04-10 ENCOUNTER — Other Ambulatory Visit: Payer: Self-pay | Admitting: *Deleted

## 2011-04-10 DIAGNOSIS — I10 Essential (primary) hypertension: Secondary | ICD-10-CM

## 2011-04-10 DIAGNOSIS — E7800 Pure hypercholesterolemia, unspecified: Secondary | ICD-10-CM

## 2011-04-10 MED ORDER — LISINOPRIL 20 MG PO TABS: 20.0000 mg | ORAL_TABLET | Freq: Every day | ORAL | Status: DC

## 2011-04-10 MED ORDER — ATORVASTATIN CALCIUM 20 MG PO TABS: 20.0000 mg | ORAL_TABLET | Freq: Every day | ORAL | Status: DC

## 2011-04-10 MED ORDER — AMLODIPINE BESYLATE 5 MG PO TABS: 5.0000 mg | ORAL_TABLET | Freq: Every day | ORAL | Status: DC

## 2011-04-10 NOTE — Telephone Encounter (Signed)
Pt medications were not at [facility] on [address] Reordered meds for pt. Mylo Red RN

## 2011-04-19 DIAGNOSIS — E785 Hyperlipidemia, unspecified: Secondary | ICD-10-CM | POA: Insufficient documentation

## 2011-04-19 NOTE — Assessment & Plan Note (Signed)
Adjust atorvastatin dose and recheck levels.

## 2011-04-19 NOTE — Assessment & Plan Note (Addendum)
I am not sure he understands despite my discussions.  I asked him to bring in his medications so we can confirm and get his BP down.  We will adjust his dose of lisinopril, and add amlodipine, and have him come back for early follow up.  He will continue his current doses.

## 2011-04-29 ENCOUNTER — Encounter: Payer: Self-pay | Admitting: Cardiology

## 2011-04-29 ENCOUNTER — Ambulatory Visit (INDEPENDENT_AMBULATORY_CARE_PROVIDER_SITE_OTHER): Payer: Medicare Other | Admitting: Cardiology

## 2011-04-29 DIAGNOSIS — I10 Essential (primary) hypertension: Secondary | ICD-10-CM

## 2011-04-29 DIAGNOSIS — R21 Rash and other nonspecific skin eruption: Secondary | ICD-10-CM

## 2011-04-29 MED ORDER — HYDROCHLOROTHIAZIDE 12.5 MG PO CAPS: 12.5000 mg | ORAL_CAPSULE | Freq: Every day | ORAL | Status: DC

## 2011-04-29 MED ORDER — LOSARTAN POTASSIUM 50 MG PO TABS: 50.0000 mg | ORAL_TABLET | Freq: Every day | ORAL | Status: DC

## 2011-04-29 NOTE — Assessment & Plan Note (Addendum)
BP improved.  Cause or rash unclear.  Most recent addition is amlodipine.  Will add HCTZ 12.5 mg, and recheck BMET again in one week and three weeks.   Recheck here in one week for BP control.

## 2011-04-29 NOTE — Assessment & Plan Note (Addendum)
Etiology of this is unclear.  Will try to get dermatology visit, and change amlodipine which is the latest addition to his meds.  Recheck in one week and again in three weeks.

## 2011-04-29 NOTE — Patient Instructions (Addendum)
Your physician has recommended you make the following change in your medication: STOP Amlodipine, START HCTZ 12.5mg  once a day  Your physician recommends that you schedule a follow-up appointment in: 1 WEEK with Tereso Newcomer PA-C to follow-up on rash and medication changes and 3-4 WEEKS with Dr Fredrik Cove have been referred to Dermatology (rash)  Your physician recommends that you have lab work today: CBC and BMP  Your physician recommends that you return for lab work in: 1 WEEK (BMP 401.9)

## 2011-04-29 NOTE — Progress Notes (Signed)
HPI:  He returns today with his medication.  Overall he is doing well.  He denies any chest pain.  Since I saw him last his BP is down, but he has develop a rash over the sternum with discoloration of the skin.  He has put some cream on it, and says it is better.  He is not taking HCTZ.    Current Outpatient Prescriptions  Medication Sig Dispense Refill  . amLODipine (NORVASC) 5 MG tablet Take 1 tablet (5 mg total) by mouth daily.  30 tablet  11  . atorvastatin (LIPITOR) 20 MG tablet Take 1 tablet (20 mg total) by mouth daily.  30 tablet  11  . ibuprofen (ADVIL,MOTRIN) 200 MG tablet Take 200 mg by mouth as needed.        Marland Kitchen lisinopril (PRINIVIL,ZESTRIL) 20 MG tablet Take 1 tablet (20 mg total) by mouth daily.  30 tablet  11  . Sennosides (EX-LAX PO) Take by mouth. AS DIRECTED PRN       . castor oil liquid Take by mouth. AS DIRECTED       . hydrochlorothiazide 50 MG tablet Take 50 mg by mouth daily. 1/2 TAB EVER DAY        No Known Allergies  Past Medical History  Diagnosis Date  . Hypertension   . Dyslipidemia   . Stroke     Past Surgical History  Procedure Date  . Cardiac catheterization     No family history on file.  History   Social History  . Marital Status: Widowed    Spouse Name: N/A    Number of Children: N/A  . Years of Education: N/A   Occupational History  . Not on file.   Social History Main Topics  . Smoking status: Never Smoker   . Smokeless tobacco: Not on file  . Alcohol Use: Not on file  . Drug Use: Not on file  . Sexually Active: Not on file   Other Topics Concern  . Not on file   Social History Narrative  . No narrative on file    ROS: Please see the HPI.  All other systems reviewed and negative.  PHYSICAL EXAM:  BP 153/80  Pulse 61  Ht 5' 6.5" (1.689 m)  Wt 189 lb (85.73 kg)  BMI 30.05 kg/m2  General: Well developed, well nourished, in no acute distress. Head:  Normocephalic and atraumatic. Chest:  Black blue discoloration of skin  up to clavicles with some peeling.  Not maculopapular.   Lungs: Clear to auscultation and percussion. Heart: Normal S1 and S2.  No murmur, rubs or gallops. S4 gallop Abdomen:  Normal bowel sounds; soft; non tender; no organomegaly Pulses: Pulses normal in all 4 extremities. Extremities: No clubbing or cyanosis. No edema. Neurologic: Alert and oriented x 3.  EKG:  ASSESSMENT AND PLAN:

## 2011-04-29 NOTE — Assessment & Plan Note (Signed)
Ran out of atorvastatin and did not get it filled.  Will resume.

## 2011-05-06 ENCOUNTER — Ambulatory Visit (INDEPENDENT_AMBULATORY_CARE_PROVIDER_SITE_OTHER): Payer: Self-pay | Admitting: *Deleted

## 2011-05-06 ENCOUNTER — Encounter: Payer: Self-pay | Admitting: Physician Assistant

## 2011-05-06 ENCOUNTER — Ambulatory Visit (INDEPENDENT_AMBULATORY_CARE_PROVIDER_SITE_OTHER): Payer: Self-pay | Admitting: Physician Assistant

## 2011-05-06 VITALS — BP 173/80 | HR 64 | Ht 66.5 in | Wt 187.8 lb

## 2011-05-06 DIAGNOSIS — I10 Essential (primary) hypertension: Secondary | ICD-10-CM

## 2011-05-06 DIAGNOSIS — R21 Rash and other nonspecific skin eruption: Secondary | ICD-10-CM

## 2011-05-06 MED ORDER — CLONIDINE HCL 0.1 MG PO TABS: 0.1000 mg | ORAL_TABLET | Freq: Two times a day (BID) | ORAL | Status: DC

## 2011-05-06 NOTE — Patient Instructions (Signed)
Your physician has recommended you make the following change in your medication: START CLONIDINE 0.1 MG 1 TABLET TWICE DAILY

## 2011-05-06 NOTE — Progress Notes (Signed)
History of Present Illness: Primary Cardiologist:  Dr.  Shawnie Pons   [name] is a 70 y.o. male here for follow up on BP.  Has a h/o minimal coronary disease by cath in 1/08: EF 60%, mild dilated Ao root, oD2 30%, LAD 20-30%.  Other hx includes right thalamic CVA in 6/10, prior hx of Afib, HTN, hyperlipidemia, and anemia.  Saw Dr. Riley Kill 9/26.  Had a rash on his chest and amlodipine was stopped.  HCTZ was started.  Had follow up BMET today.  Rash is much improved.  Sees dermatology in January.  But, rash almost resolved.  Denies headaches, syncope, chest pain, orthopnea, PND or edema.  Has chronic DOE.  No changes.    Past Medical History  Diagnosis Date  . Hypertension   . Dyslipidemia   . Stroke     Current Outpatient Prescriptions  Medication Sig Dispense Refill  . atorvastatin (LIPITOR) 20 MG tablet Take 1 tablet (20 mg total) by mouth daily.  30 tablet  11  . castor oil liquid Take by mouth. AS DIRECTED       . hydrochlorothiazide (MICROZIDE) 12.5 MG capsule Take 1 capsule (12.5 mg total) by mouth daily.  30 capsule  6  . ibuprofen (ADVIL,MOTRIN) 200 MG tablet Take 200 mg by mouth as needed.        Marland Kitchen lisinopril (PRINIVIL,ZESTRIL) 20 MG tablet Take 1 tablet (20 mg total) by mouth daily.      . Sennosides (EX-LAX PO) Take by mouth. AS DIRECTED PRN         Allergies: No Known Allergies  Vital Signs: BP 173/80  Pulse 64  Ht 5' 6.5" (1.689 m)  Wt 187 lb 12.8 oz (85.186 kg)  BMI 29.86 kg/m2  PHYSICAL EXAM: Well nourished, well developed, in no acute distress HEENT: normal Neck: no JVD at 45 degrees Cardiac:  normal S1, S2; RRR; no murmur Lungs:  clear to auscultation bilaterally, no wheezing, rhonchi or rales Abd: soft, nontender, no hepatomegaly Ext: no edema Skin: warm and dry; large plaque over sternum with darkened pigment Neuro:  CNs 2-12 intact, no focal abnormalities noted  ASSESSMENT AND PLAN:

## 2011-05-06 NOTE — Assessment & Plan Note (Signed)
Uncontrolled.  Repeat bp by me 180/98.  Add clonidine 0.1 mg BID.  Follow up bmet pending.  Keep appt with Dr. Riley Kill in 2 weeks.

## 2011-05-06 NOTE — Assessment & Plan Note (Signed)
Rash much improved.  Not completely resolved yet.  Stay off amlodipine.  He should keep dermatology appt.

## 2011-05-08 LAB — BASIC METABOLIC PANEL
CO2: 25 mEq/L (ref 19–32)
Chloride: 106 mEq/L (ref 96–112)
Creatinine, Ser: 1.6 mg/dL — ABNORMAL HIGH (ref 0.4–1.5)
Sodium: 139 mEq/L (ref 135–145)

## 2011-05-12 LAB — CBC
HCT: 38.2 — ABNORMAL LOW
Hemoglobin: 12.4 — ABNORMAL LOW
MCHC: 32.3
MCV: 67.4 — ABNORMAL LOW
Platelets: 196
RBC: 5.67
RDW: 15.7 — ABNORMAL HIGH
WBC: 4.9

## 2011-05-12 LAB — URINALYSIS, ROUTINE W REFLEX MICROSCOPIC
Bilirubin Urine: NEGATIVE
Glucose, UA: NEGATIVE
Ketones, ur: NEGATIVE
Leukocytes, UA: NEGATIVE
Nitrite: NEGATIVE
Protein, ur: NEGATIVE
Specific Gravity, Urine: 1.018
Urobilinogen, UA: 0.2
Urobilinogen, UA: 0.2
pH: 6

## 2011-05-12 LAB — COMPREHENSIVE METABOLIC PANEL
ALT: 19
AST: 25
Albumin: 3.6
Alkaline Phosphatase: 64
BUN: 15
CO2: 27
Calcium: 9.1
Chloride: 103
Creatinine, Ser: 0.95
GFR calc Af Amer: >60
GFR calc non Af Amer: >60
Glucose, Bld: 81
Potassium: 3.9
Sodium: 138
Total Bilirubin: 0.3
Total Protein: 7.2

## 2011-05-12 LAB — DIFFERENTIAL
Basophils Absolute: 0
Basophils Relative: 0
Eosinophils Absolute: 0.1
Eosinophils Relative: 1
Lymphocytes Relative: 36
Lymphs Abs: 1.8
Monocytes Absolute: 0.4
Monocytes Relative: 9
Neutro Abs: 2.6
Neutrophils Relative %: 54
Smear Review: NORMAL

## 2011-05-12 LAB — URINE MICROSCOPIC-ADD ON

## 2011-05-22 ENCOUNTER — Ambulatory Visit: Payer: Medicare Other | Admitting: Cardiology

## 2011-07-16 ENCOUNTER — Encounter: Payer: Self-pay | Admitting: Physician Assistant

## 2011-07-16 ENCOUNTER — Ambulatory Visit (INDEPENDENT_AMBULATORY_CARE_PROVIDER_SITE_OTHER): Payer: Medicare Other | Admitting: Physician Assistant

## 2011-07-16 VITALS — BP 179/108 | HR 89 | Ht 66.5 in | Wt 186.4 lb

## 2011-07-16 DIAGNOSIS — I10 Essential (primary) hypertension: Secondary | ICD-10-CM

## 2011-07-16 DIAGNOSIS — R21 Rash and other nonspecific skin eruption: Secondary | ICD-10-CM

## 2011-07-16 DIAGNOSIS — E785 Hyperlipidemia, unspecified: Secondary | ICD-10-CM

## 2011-07-16 DIAGNOSIS — N400 Enlarged prostate without lower urinary tract symptoms: Secondary | ICD-10-CM

## 2011-07-16 DIAGNOSIS — R55 Syncope and collapse: Secondary | ICD-10-CM

## 2011-07-16 DIAGNOSIS — I739 Peripheral vascular disease, unspecified: Secondary | ICD-10-CM

## 2011-07-16 MED ORDER — LISINOPRIL 20 MG PO TABS: 20.0000 mg | ORAL_TABLET | Freq: Every day | ORAL | Status: DC

## 2011-07-16 NOTE — Assessment & Plan Note (Addendum)
Uncontrolled.  However, he ran out of his lisinopril 3 days ago.  I will refill this for him so that this can be restarted.  We will recheck his blood pressure in followup.

## 2011-07-16 NOTE — Patient Instructions (Addendum)
Your physician recommends that you return for lab work in: today (cbc, bmet, tsh, lfts)  Your physician has recommended that you wear an event monitor. Event monitors are medical devices that record the heart's electrical activity. Doctors most often [place] these monitors to diagnose arrhythmias. Arrhythmias are problems with the speed or rhythm of the heartbeat. The monitor is a small, portable device. You can wear one while you do your normal daily activities. This is usually used to diagnose what is causing palpitations/syncope (passing out).  Your physician has requested that you have en exercise stress myoview. For further information please visit [email or web address] Please follow instruction sheet, as given.  No driving for now, until released by your physician  You are being referred to a primary physician for BPH- 08/20/2011 at 3:00pm with Dr Oliver Barre.  If you need to cancel, please call 24 hours in advance to avoid a $50 no show charge.  Dr Jonny Ruiz is at [facility] at [address] [place], [place], [place]  [id number] [phone]  Your physician has requested that you have an ankle brachial index (ABI). During this test an ultrasound and blood pressure cuff are used to evaluate the arteries that supply the arms and legs with blood. Allow thirty minutes for this exam. There are no restrictions or special instructions.

## 2011-07-16 NOTE — Assessment & Plan Note (Signed)
-  Continue Lipitor °

## 2011-07-16 NOTE — Assessment & Plan Note (Signed)
He has bilateral calf pain with exertion that goes away with rest.  He denies any difficulty with nonhealing ulcers.  Obtain ABIs.

## 2011-07-16 NOTE — Progress Notes (Signed)
[address] [place], [place]  [address] Phone: [phone] Fax:  [phone]  Date:  07/16/2011   Name:  [name]       DOB:  05-Nov-1940 MRN:  [id number]  PCP:  None Primary Cardiologist:  Dr.  Shawnie Pons  Primary Electrophysiologist:  none   History of Present Illness: [name] is a 70 y.o. male presents for follow up.  He has a h/o minimal coronary disease by cath in 1/08: EF 60%, mild dilated Ao root, oD2 30%, LAD 20-30%. Other hx includes right thalamic CVA in 6/10, prior hx of Afib, HTN, hyperlipidemia, and anemia.  Last echocardiogram 6/10: Moderate LVH, EF 55-65%, mild, mild MR, MAC, mild LAE, PASP 32.    He presents with a recent episode of syncope.  He has never passed out.  This occurred shortly after eating cucumber.  He had antecedent abdominal pain.  He denies any prodrome.  He denies any injury.  He denies any associated vomiting or diarrhea.  He did not feel confused upon awakening.  He denies chest pain, shortness of breath, palpitations, orthopnea, PND or edema.  He does note lower extremity pain with walking (in his calves).  Past Medical History  Diagnosis Date  . Hypertension     echocardiogram 6/10: Moderate LVH, EF 55-65%, mild, mild MR, MAC, mild LAE, PASP 32  . Dyslipidemia   . History of stroke   . CAD (coronary artery disease)     cath in 1/08: EF 60%, mild dilated Ao root, oD2 30%, LAD 20-30%.  . Paroxysmal atrial fibrillation   . Anemia     Current Outpatient Prescriptions  Medication Sig Dispense Refill  . atorvastatin (LIPITOR) 20 MG tablet Take 1 tablet (20 mg total) by mouth daily.  30 tablet  11  . castor oil liquid Take by mouth. AS DIRECTED       . cloNIDine (CATAPRES) 0.1 MG tablet Take 1 tablet (0.1 mg total) by mouth 2 (two) times daily.  60 tablet  11  . hydrochlorothiazide (MICROZIDE) 12.5 MG capsule Take 1 capsule (12.5 mg total) by mouth daily.  30 capsule  6  . ibuprofen (ADVIL,MOTRIN) 200  MG tablet Take 200 mg by mouth as needed.        Marland Kitchen lisinopril (PRINIVIL,ZESTRIL) 20 MG tablet Take 1 tablet (20 mg total) by mouth daily.      . Sennosides (EX-LAX PO) Take by mouth. AS DIRECTED PRN         Allergies: No Known Allergies  History  Substance Use Topics  . Smoking status: Never Smoker   . Smokeless tobacco: Not on file  . Alcohol Use: Not on file     ROS:  Please see the history of present illness.   He does note significant nocturia and urinary frequency for the last one to 2 years.  All other systems reviewed and negative.   PHYSICAL EXAM: VS:  BP 160/84  Pulse 90  Ht 5' 6.5" (1.689 m)  Wt 186 lb 6.4 oz (84.55 kg)  BMI 29.63 kg/m2   Filed Vitals:   07/16/11 1500 07/16/11 1501 07/16/11 1502 07/16/11 1504  BP: 185/111 193/103 195/113 179/108  Pulse: 88 91 89 89  Height:      Weight:         Well nourished, well developed, in no acute distress HEENT: normal Neck: no JVD Vascular:  No carotid bruits Cardiac:  normal S1, S2; RRR; no murmur Lungs:  clear  to auscultation bilaterally, no wheezing, rhonchi or rales Abd: soft, nontender, no hepatomegaly Ext: no edema Skin: warm and dry Neuro:  CNs 2-12 intact, no focal abnormalities noted Psych: Normal affect  EKG:   Sinus rhythm, heart rate 90, left axis deviation, nonspecific ST-T wave changes, LVH, no significant change when compared to prior tracings.  ASSESSMENT AND PLAN:

## 2011-07-16 NOTE — Assessment & Plan Note (Signed)
He has symptoms that sound consistent with BPH.  I will refer him to primary care.

## 2011-07-16 NOTE — Assessment & Plan Note (Signed)
Etiology unclear.  In all likelihood, this represents a vagal episode.  However, he has significant risk factors and a prior history of nonobstructive coronary disease.  I will obtain an ETT-Myoview as well as an event monitor.  Obtain labs: Basic metabolic panel, CBC, LFTs, TSH. Check orthostatic vital signs today.  I advised him to refrain from driving until seen back in followup.  Followup with Dr.  Shawnie Pons in 4 weeks.

## 2011-07-17 LAB — CBC WITH DIFFERENTIAL/PLATELET
Basophils Relative: 0.5 % (ref 0.0–3.0)
Eosinophils Relative: 1.3 % (ref 0.0–5.0)
Hemoglobin: 12.1 g/dL — ABNORMAL LOW (ref 13.0–17.0)
MCV: 70.2 fl — ABNORMAL LOW (ref 78.0–100.0)
Monocytes Absolute: 0.5 10*3/uL (ref 0.1–1.0)
Neutrophils Relative %: 50.5 % (ref 43.0–77.0)
RBC: 5.37 Mil/uL (ref 4.22–5.81)
WBC: 6.4 10*3/uL (ref 4.5–10.5)

## 2011-07-17 LAB — HEPATIC FUNCTION PANEL
ALT: 15 U/L (ref 0–53)
AST: 34 U/L (ref 0–37)
Albumin: 4 g/dL (ref 3.5–5.2)

## 2011-07-17 LAB — BASIC METABOLIC PANEL
BUN: 18 mg/dL (ref 6–23)
Chloride: 112 mEq/L (ref 96–112)
GFR: 65.6 mL/min (ref >60.00)
Glucose, Bld: 83 mg/dL (ref 70–99)
Potassium: 5 mEq/L (ref 3.5–5.1)

## 2011-07-17 LAB — TSH: TSH: 0.98 u[IU]/mL (ref 0.35–5.50)

## 2011-07-22 ENCOUNTER — Telehealth: Payer: Self-pay | Admitting: Cardiology

## 2011-07-22 DIAGNOSIS — I10 Essential (primary) hypertension: Secondary | ICD-10-CM

## 2011-07-22 NOTE — Telephone Encounter (Signed)
Fu call °Pt returning your call  °

## 2011-07-22 NOTE — Telephone Encounter (Signed)
Left message for pt to call back  °

## 2011-07-22 NOTE — Telephone Encounter (Signed)
Left message for pt to call back.  Make pt aware of follow-up appointment with Dr Riley Kill and lab results and need for follow-up BMP this week.

## 2011-07-23 ENCOUNTER — Encounter: Payer: Self-pay | Admitting: *Deleted

## 2011-07-24 ENCOUNTER — Ambulatory Visit (INDEPENDENT_AMBULATORY_CARE_PROVIDER_SITE_OTHER): Payer: Medicare Other | Admitting: *Deleted

## 2011-07-24 DIAGNOSIS — I10 Essential (primary) hypertension: Secondary | ICD-10-CM

## 2011-07-24 LAB — BASIC METABOLIC PANEL
Calcium: 8.9 mg/dL (ref 8.4–10.5)
Creatinine, Ser: 1.2 mg/dL (ref 0.4–1.5)
GFR: 77.83 mL/min (ref >60.00)

## 2011-07-24 NOTE — Telephone Encounter (Signed)
Pt had BMP drawn today.  I made pt aware of all upcoming appointments with our office.

## 2011-08-05 ENCOUNTER — Telehealth: Payer: Self-pay | Admitting: *Deleted

## 2011-08-05 ENCOUNTER — Encounter (HOSPITAL_COMMUNITY): Payer: Medicare Other | Admitting: Radiology

## 2011-08-05 ENCOUNTER — Encounter: Payer: Medicare Other | Admitting: *Deleted

## 2011-08-05 NOTE — Telephone Encounter (Signed)
Pt was nos for appts. Pt called no answer unable to leave voice mess letter mail out to patient to call office to rsc appts.[name]

## 2011-08-10 ENCOUNTER — Telehealth: Payer: Self-pay

## 2011-08-10 NOTE — Telephone Encounter (Signed)
Pt was called regarding his missed appts on 08/05/11 (myoview, event monitor and ABIs).  N/A.  No voicemail.

## 2011-08-10 NOTE — Telephone Encounter (Signed)
NA. No voicemail.  

## 2011-08-15 ENCOUNTER — Encounter: Payer: Self-pay | Admitting: Internal Medicine

## 2011-08-15 DIAGNOSIS — I2510 Atherosclerotic heart disease of native coronary artery without angina pectoris: Secondary | ICD-10-CM | POA: Insufficient documentation

## 2011-08-15 DIAGNOSIS — D509 Iron deficiency anemia, unspecified: Secondary | ICD-10-CM | POA: Insufficient documentation

## 2011-08-15 DIAGNOSIS — Z8701 Personal history of pneumonia (recurrent): Secondary | ICD-10-CM

## 2011-08-15 DIAGNOSIS — I77810 Thoracic aortic ectasia: Secondary | ICD-10-CM | POA: Insufficient documentation

## 2011-08-15 DIAGNOSIS — Z0001 Encounter for general adult medical examination with abnormal findings: Secondary | ICD-10-CM | POA: Insufficient documentation

## 2011-08-15 DIAGNOSIS — I252 Old myocardial infarction: Secondary | ICD-10-CM

## 2011-08-15 DIAGNOSIS — I480 Paroxysmal atrial fibrillation: Secondary | ICD-10-CM | POA: Insufficient documentation

## 2011-08-15 HISTORY — DX: Thoracic aortic ectasia: I77.810

## 2011-08-15 HISTORY — DX: Old myocardial infarction: I25.2

## 2011-08-15 HISTORY — DX: Iron deficiency anemia, unspecified: D50.9

## 2011-08-15 HISTORY — DX: Personal history of pneumonia (recurrent): Z87.01

## 2011-08-18 ENCOUNTER — Encounter: Payer: Self-pay | Admitting: *Deleted

## 2011-08-20 ENCOUNTER — Encounter: Payer: Self-pay | Admitting: Internal Medicine

## 2011-08-20 ENCOUNTER — Ambulatory Visit (INDEPENDENT_AMBULATORY_CARE_PROVIDER_SITE_OTHER): Payer: Medicare Other | Admitting: Internal Medicine

## 2011-08-20 VITALS — BP 162/84 | HR 80 | Temp 97.0°F | Ht 66.0 in | Wt 184.5 lb

## 2011-08-20 DIAGNOSIS — Z0000 Encounter for general adult medical examination without abnormal findings: Secondary | ICD-10-CM

## 2011-08-20 DIAGNOSIS — E785 Hyperlipidemia, unspecified: Secondary | ICD-10-CM

## 2011-08-20 DIAGNOSIS — R351 Nocturia: Secondary | ICD-10-CM | POA: Insufficient documentation

## 2011-08-20 DIAGNOSIS — Z23 Encounter for immunization: Secondary | ICD-10-CM

## 2011-08-20 DIAGNOSIS — N529 Male erectile dysfunction, unspecified: Secondary | ICD-10-CM

## 2011-08-20 DIAGNOSIS — D509 Iron deficiency anemia, unspecified: Secondary | ICD-10-CM

## 2011-08-20 MED ORDER — LOVASTATIN 40 MG PO TABS: 40.0000 mg | ORAL_TABLET | Freq: Every day | ORAL | Status: DC

## 2011-08-20 MED ORDER — PNEUMOCOCCAL VAC POLYVALENT 25 MCG/0.5ML IJ INJ: 0.5000 mL | INJECTION | Freq: Once | INTRAMUSCULAR | Status: DC

## 2011-08-20 NOTE — Progress Notes (Signed)
Subjective:    Patient ID: [name], male    DOB: 26-Feb-1941, 71 y.o.   MRN: [id number]  HPI Here as new pt to establish,  C/o recent worsening ED symptoms, has not tried levitra in the past but requests tx.  Has also significant nocturia up to 5 times per night most nights, has seen [facility] urology several yrs ago, cant recall the name and not seen since.  Pt denies chest pain, increased sob or doe, wheezing, orthopnea, PND, increased LE swelling, palpitations, dizziness or syncope.  Pt denies new neurological symptoms such as new headache, or facial or extremity weakness or numbness   Pt denies polydipsia, polyuria.  No overt bleeding or bruising recent.   Pt denies fever, wt loss, night sweats, loss of appetite, or other constitutional symptoms  Denies worsening depressive symptoms, suicidal ideation, or panic.  Overall good compliance with treatment, and good medicine tolerability. Has had o/w little contact with physicians it seems, no prior hx of colonoscopy per pt Past Medical History  Diagnosis Date  . Hypertension     echocardiogram 6/10: Moderate LVH, EF 55-65%, mild, mild MR, MAC, mild LAE, PASP 32  . Dyslipidemia   . History of stroke   . CAD (coronary artery disease)     cath in 1/08: EF 60%, mild dilated Ao root, oD2 30%, LAD 20-30%.  . Paroxysmal atrial fibrillation   . Anemia, iron deficiency 08/15/2011  . History of pneumonia 08/15/2011  . History of MI (myocardial infarction) 08/15/2011  . 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 Cardiac catheterization     reports that he has never smoked. He does not have any smokeless tobacco history on file. His alcohol and drug histories not on file. family history is not on file. No Known Allergies Current Outpatient Prescriptions on File Prior to Visit  Medication Sig Dispense Refill  . castor oil liquid Take by mouth. AS DIRECTED       . cloNIDine (CATAPRES) 0.1 MG tablet Take 1 tablet (0.1  mg total) by mouth 2 (two) times daily.  60 tablet  11  . fish oil-omega-3 fatty acids 1000 MG capsule Take 2 g by mouth daily.        . hydrochlorothiazide (MICROZIDE) 12.5 MG capsule Take 1 capsule (12.5 mg total) by mouth daily.  30 capsule  6  . ibuprofen (ADVIL,MOTRIN) 200 MG tablet Take 200 mg by mouth as needed.        Marland Kitchen lisinopril (PRINIVIL,ZESTRIL) 20 MG tablet Take 1 tablet (20 mg total) by mouth daily.  30 tablet  6  . Sennosides (EX-LAX PO) Take by mouth. AS DIRECTED PRN        No current facility-administered medications on file prior to visit.   Review of Systems Review of Systems  Constitutional: Negative for diaphoresis and unexpected weight change.  HENT: Negative for drooling and tinnitus.   Eyes: Negative for photophobia and visual disturbance.  Respiratory: Negative for choking and stridor.   Gastrointestinal: Negative for vomiting and blood in stool.  Genitourinary: Negative for hematuria and decreased urine volume.    Objective:   Physical Exam BP 162/84  Pulse 80  Temp(Src) 97 F (36.1 C) (Oral)  Ht 5\' 6"  (1.676 m)  Wt 184 lb 8 oz (83.689 kg)  BMI 29.78 kg/m2  SpO2 99% Physical Exam  VS noted Constitutional: Pt appears well-developed and well-nourished.  HENT: Head: Normocephalic.  Right Ear: External ear normal.  Left Ear: External ear normal.  Eyes: Conjunctivae and EOM are normal. Pupils are equal, round, and reactive to light.  Neck: Normal range of motion. Neck supple.  Cardiovascular: Normal rate and regular rhythm.   Pulmonary/Chest: Effort normal and breath sounds normal.  Abd:  Soft, NT, non-distended, + BS Neurological: Pt is alert. No cranial nerve deficit.  Skin: Skin is warm. No erythema.  Psychiatric: Pt behavior is normal. not depressed affect   Assessment & Plan:

## 2011-08-20 NOTE — Assessment & Plan Note (Signed)
Ok for levitra prn,  to f/u any worsening symptoms or concerns 

## 2011-08-20 NOTE — Assessment & Plan Note (Signed)
Not charged but due for pneumovax - to be done today

## 2011-08-20 NOTE — Assessment & Plan Note (Signed)
Long standing but rather severe up to 5 times per night - for urology referral

## 2011-08-20 NOTE — Assessment & Plan Note (Signed)
Apparently did not tolerate the lipitor, will try lovastatin with f/u labs next visit

## 2011-08-20 NOTE — Assessment & Plan Note (Signed)
Has never had colonoscopy or eval - for GI referral -

## 2011-08-20 NOTE — Patient Instructions (Addendum)
You had the pneumonia shot today You will be contacted regarding the referral for: Gastroenterology [facility]), and Urology  Please start the generic for cholesterol called lovastatin  - 40 mg per day The prescription for levitra is also sent to the pharmacy for as needed use Continue all other medications as before Please return in 3 months, or sooner if needed, as you will need further blood work that day Please bring your insurance card next time

## 2011-08-21 ENCOUNTER — Encounter: Payer: Self-pay | Admitting: Gastroenterology

## 2011-09-02 ENCOUNTER — Encounter: Payer: Self-pay | Admitting: Gastroenterology

## 2011-09-02 ENCOUNTER — Ambulatory Visit (INDEPENDENT_AMBULATORY_CARE_PROVIDER_SITE_OTHER): Payer: Medicare Other | Admitting: Gastroenterology

## 2011-09-02 VITALS — BP 150/80 | HR 70 | Ht 66.5 in | Wt 185.4 lb

## 2011-09-02 DIAGNOSIS — D509 Iron deficiency anemia, unspecified: Secondary | ICD-10-CM

## 2011-09-02 MED ORDER — PEG-KCL-NACL-NASULF-NA ASC-C 100 G PO SOLR: 1.0000 | Freq: Once | ORAL | Status: DC

## 2011-09-02 NOTE — Patient Instructions (Signed)
You have been scheduled for a Colonoscopy with propofol. See separate instructions.  Pick up your prep kit from your pharmacy.  Follow instructions on Hemoccult cards and mail them back to [place] when finished.  cc: Oliver Barre, MD

## 2011-09-02 NOTE — Progress Notes (Signed)
History of Present Illness: This is a 71 year old male who has had a microcytic anemia with a low iron level known for several years. Recent hemoglobin was 12.1 with an MCV of 70.2. He has no gastrointestinal complaints. Denies weight loss, abdominal pain, constipation, diarrhea, change in stool caliber, melena, hematochezia, nausea, vomiting, dysphagia, reflux symptoms, chest pain.  Review of Systems: Pertinent positive and negative review of systems were noted in the above HPI section. All other review of systems were otherwise negative.  Current Medications, Allergies, Past Medical History, Past Surgical History, Family History and Social History were reviewed in [employer] record.  Physical Exam: General: Well developed , well nourished, no acute distress Head: Normocephalic and atraumatic Eyes:  sclerae anicteric, EOMI Ears: Normal auditory acuity Mouth: No deformity or lesions Neck: Supple, no masses or thyromegaly Lungs: Clear throughout to auscultation Heart: Regular rate and rhythm; no murmurs, rubs or bruits Abdomen: Soft, non tender and non distended. No masses, hepatosplenomegaly or hernias noted. Normal Bowel sounds Rectal: Deferred to colonoscopy. Musculoskeletal: Symmetrical with no gross deformities  Skin: No lesions on visible extremities Pulses:  Normal pulses noted Extremities: No clubbing, cyanosis, edema or deformities noted Neurological: Alert oriented x 4, grossly nonfocal Cervical Nodes:  No significant cervical adenopathy Inguinal Nodes: No significant inguinal adenopathy Psychological:  Alert and cooperative. Normal mood and affect  Assessment and Recommendations:  1. Iron deficiency anemia. Rule out colorectal sources of blood loss. Obtain home stool Hemoccults. Begin iron therapy after colonoscopy. The risks, benefits, and alternatives to colonoscopy with possible biopsy and possible polypectomy were discussed with the patient and they  consent to proceed.

## 2011-09-04 ENCOUNTER — Encounter: Payer: Self-pay | Admitting: Cardiology

## 2011-09-04 ENCOUNTER — Ambulatory Visit (INDEPENDENT_AMBULATORY_CARE_PROVIDER_SITE_OTHER): Payer: Medicare Other | Admitting: Cardiology

## 2011-09-04 VITALS — BP 210/100 | HR 64 | Ht 66.5 in | Wt 184.8 lb

## 2011-09-04 DIAGNOSIS — I10 Essential (primary) hypertension: Secondary | ICD-10-CM

## 2011-09-04 DIAGNOSIS — R21 Rash and other nonspecific skin eruption: Secondary | ICD-10-CM

## 2011-09-04 DIAGNOSIS — R55 Syncope and collapse: Secondary | ICD-10-CM

## 2011-09-04 NOTE — Patient Instructions (Signed)
Your physician recommends that you schedule a follow-up appointment in: 1 MONTH with Tereso Newcomer PA-C and 2 MONTHS with Dr Riley Kill  Please take your medications as prescribed.  Your physician has requested that you have an abdominal aorta duplex. During this test, an ultrasound is used to evaluate the aorta. Allow 30 minutes for this exam. Do not eat after midnight the day before and avoid carbonated beverages

## 2011-09-04 NOTE — Progress Notes (Signed)
HPI:  He has been doing well.  He saw Lorin Picket after a syncopal event, and this was associated with eating a cucumber.  Scott set him up for a number of studies,and he no showed for all of them---myoview, event monitor.  In asking him about this, he says that after the cucumber, he was fine.  The next morning he was getting ready for church, and sat down on the toilet and got terrible abdominal pain, then went out on the toilet.  Nothing even similar since.  He might benefit from ruling out AAA, but the likelihood of an arryhtmia beyond a vasovagal event seems unlikely.  Regarding his BP, he says he ran out and did not get it filled.  Notably, he did not take today and says he is going to get his meds filled.    Current Outpatient Prescriptions  Medication Sig Dispense Refill  . castor oil liquid Take by mouth. AS DIRECTED       . cloNIDine (CATAPRES) 0.1 MG tablet Take 1 tablet (0.1 mg total) by mouth 2 (two) times daily.  60 tablet  11  . fish oil-omega-3 fatty acids 1000 MG capsule Take 2 g by mouth daily.        . hydrochlorothiazide (MICROZIDE) 12.5 MG capsule Take 1 capsule (12.5 mg total) by mouth daily.  30 capsule  6  . ibuprofen (ADVIL,MOTRIN) 200 MG tablet Take 200 mg by mouth as needed.        Marland Kitchen lisinopril (PRINIVIL,ZESTRIL) 20 MG tablet Take 1 tablet (20 mg total) by mouth daily.  30 tablet  6  . lovastatin (MEVACOR) 40 MG tablet Take 1 tablet (40 mg total) by mouth at bedtime.  90 tablet  3  . peg 3350 powder (MOVIPREP) 100 G SOLR Take 1 kit (100 g total) by mouth once.  1 kit  0  . Sennosides (EX-LAX PO) Take by mouth. AS DIRECTED PRN        Current Facility-Administered Medications  Medication Dose Route Frequency Provider Last Rate Last Dose  . pneumococcal 23 valent vaccine (PNU-IMMUNE) injection 0.5 mL  0.5 mL Intramuscular Once Oliver Barre, MD        No Known Allergies  Past Medical History  Diagnosis Date  . Hypertension     echocardiogram 6/10: Moderate LVH, EF 55-65%,  mild, mild MR, MAC, mild LAE, PASP 32  . Dyslipidemia   . History of stroke   . CAD (coronary artery disease)     cath in 1/08: EF 60%, mild dilated Ao root, oD2 30%, LAD 20-30%.  . Paroxysmal atrial fibrillation   . Anemia, iron deficiency 08/15/2011  . History of pneumonia 08/15/2011  . History of MI (myocardial infarction) 08/15/2011  . 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 Cardiac catheterization     Family History  Problem Relation Age of Onset  . Colon cancer Neg Hx   . Aneurysm Sister     History   Social History  . Marital Status: Widowed    Spouse Name: N/A    Number of Children: 2  . Years of Education: N/A   Occupational History  . retired    Social History Main Topics  . Smoking status: Never Smoker   . Smokeless tobacco: Never Used  . Alcohol Use: No  . Drug Use: No  . Sexually Active: Not on file   Other Topics Concern  . Not on file   Social History  Narrative  . No narrative on file    ROS: Please see the HPI.  All other systems reviewed and negative.  PHYSICAL EXAM:  BP 210/100  Pulse 64  Ht 5' 6.5" (1.689 m)  Wt 83.825 kg (184 lb 12.8 oz)  BMI 29.38 kg/m2  General: Well developed, well nourished, in no acute distress. Head:  Normocephalic and atraumatic. Neck: no JVD Lungs: Clear to auscultation and percussion. Heart: Normal S1 and S2.  No murmur, rubs or gallops.  Abdomen:  Normal bowel sounds; soft; non tender; no organomegaly/.  I do not feel a AAA.  Pulses: Pulses normal in all 4 extremities. Extremities: No clubbing or cyanosis. No edema. Neurologic: Alert and oriented x 3.  EKG:  ASSESSMENT AND PLAN:

## 2011-09-16 ENCOUNTER — Encounter: Payer: Self-pay | Admitting: Cardiology

## 2011-09-22 ENCOUNTER — Encounter: Payer: Medicare Other | Admitting: Gastroenterology

## 2011-09-25 NOTE — Assessment & Plan Note (Signed)
Did not take any of his meds today. I encouraged him to do so.

## 2011-09-25 NOTE — Assessment & Plan Note (Signed)
Pretty much resolved 

## 2011-09-25 NOTE — Assessment & Plan Note (Signed)
No further episodes.  See my note.  No showed for all of his studies.  History sounds like neurally mediated event.

## 2011-09-30 ENCOUNTER — Telehealth: Payer: Self-pay | Admitting: Gastroenterology

## 2011-09-30 NOTE — Telephone Encounter (Signed)
Error

## 2011-10-01 ENCOUNTER — Encounter: Payer: Self-pay | Admitting: Gastroenterology

## 2011-10-01 ENCOUNTER — Encounter: Payer: Self-pay | Admitting: Internal Medicine

## 2011-10-01 ENCOUNTER — Ambulatory Visit (INDEPENDENT_AMBULATORY_CARE_PROVIDER_SITE_OTHER): Payer: Medicare Other | Admitting: Internal Medicine

## 2011-10-01 VITALS — BP 170/88 | HR 67 | Temp 96.8°F | Ht 66.5 in | Wt 181.8 lb

## 2011-10-01 DIAGNOSIS — R29898 Other symptoms and signs involving the musculoskeletal system: Secondary | ICD-10-CM

## 2011-10-01 DIAGNOSIS — R2990 Unspecified symptoms and signs involving the nervous system: Secondary | ICD-10-CM

## 2011-10-01 DIAGNOSIS — E785 Hyperlipidemia, unspecified: Secondary | ICD-10-CM

## 2011-10-01 DIAGNOSIS — I10 Essential (primary) hypertension: Secondary | ICD-10-CM

## 2011-10-01 DIAGNOSIS — R29818 Other symptoms and signs involving the nervous system: Secondary | ICD-10-CM

## 2011-10-01 MED ORDER — ASPIRIN 81 MG PO TBEC: 81.0000 mg | DELAYED_RELEASE_TABLET | Freq: Every day | ORAL | Status: AC

## 2011-10-01 MED ORDER — LOVASTATIN 40 MG PO TABS: 40.0000 mg | ORAL_TABLET | Freq: Every day | ORAL | Status: DC

## 2011-10-01 MED ORDER — LISINOPRIL-HYDROCHLOROTHIAZIDE 20-12.5 MG PO TABS: 1.0000 | ORAL_TABLET | Freq: Every day | ORAL | Status: DC

## 2011-10-01 MED ORDER — CLONIDINE HCL 0.1 MG PO TABS: 0.1000 mg | ORAL_TABLET | Freq: Two times a day (BID) | ORAL | Status: DC

## 2011-10-01 NOTE — Assessment & Plan Note (Signed)
?   New cva with symptoms 77 days old  - for stat CT no contrast now, start ASA 81 mg, consider MRI, f/u 1 wk, or sooner if needed

## 2011-10-01 NOTE — Telephone Encounter (Signed)
No answer and no machine I will continue to try and reach the patient. 

## 2011-10-01 NOTE — Assessment & Plan Note (Signed)
Severe uncontrolled, no HA but with ? New RLE weakness and "off balance";  Exam o/w benign, will need med-started and I tried to simply but combining the ACE and HCT which should still be on the $4 list at [facility];  All meds refilled escript and given pt hardcopy;  Took the bus here, not clear when he can get to the pharmacy but states will start asap

## 2011-10-01 NOTE — Assessment & Plan Note (Signed)
Not clear if taking, refilled med today, d/w pt importance of med compliance to reduce risk of CV complications;  Pt also to be referred to [facility] to help with education, med compliacne help, access to other community resources as appropriate

## 2011-10-01 NOTE — Patient Instructions (Addendum)
Please take all of your medications as prescribed Your refills were all sent to the pharmacy on the computer, as well as given you in paper copy today You will be contacted regarding the referral for: CT scan for brain - to see our [facility] now to schedule ASAP Please return in 1 week, or sooner if needed We will also try to refer you to [employer], which helps you with education and getting involved with community resources you might not be aware of Please start Aspirin 81 mg - 1 per day - COATED only to reduce risk of stroke and heart attack

## 2011-10-01 NOTE — Progress Notes (Signed)
Subjective:    Patient ID: [name], male    DOB: Dec 11, 1940, 71 y.o.   MRN: [id number]  HPI  Here to f/u; seems confused about meds, mentions only taking one and out of others but not sure which, states significant financial pressures; c/o being "off balance" today but denies dizziness, orthostasis, HA, blurred vision and Pt denies  facial or extremity weakness or numbness except for 3 days onset mild RLE weakness most evident to walk, not better or worse with anything, not taking ASA.   Pt denies fever, wt loss, night sweats, loss of appetite, or other constitutional symptoms  Pt denies chest pain, increased sob or doe, wheezing, orthopnea, PND, increased LE swelling, palpitations, dizziness or syncope.   Pt denies polydipsia, polyuria. Has hx of back pain in the past, but not recent. Past Medical History  Diagnosis Date  . Hypertension     echocardiogram 6/10: Moderate LVH, EF 55-65%, mild, mild MR, MAC, mild LAE, PASP 32  . Dyslipidemia   . History of stroke   . CAD (coronary artery disease)     cath in 1/08: EF 60%, mild dilated Ao root, oD2 30%, LAD 20-30%.  . Paroxysmal atrial fibrillation   . Anemia, iron deficiency 08/15/2011  . History of pneumonia 08/15/2011  . History of MI (myocardial infarction) 08/15/2011  . 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 Cardiac catheterization     reports that he has never smoked. He has never used smokeless tobacco. He reports that he does not drink alcohol or use illicit drugs. family history includes Aneurysm in his sister.  There is no history of Colon cancer. No Known Allergies Current Outpatient Prescriptions on File Prior to Visit  Medication Sig Dispense Refill  . castor oil liquid Take by mouth. AS DIRECTED       . ibuprofen (ADVIL,MOTRIN) 200 MG tablet Take 200 mg by mouth as needed.        . Sennosides (EX-LAX PO) Take by mouth. AS DIRECTED PRN       . fish oil-omega-3 fatty acids 1000 MG capsule  Take 2 g by mouth daily.        . peg 3350 powder (MOVIPREP) 100 G SOLR Take 1 kit (100 g total) by mouth once.  1 kit  0   Current Facility-Administered Medications on File Prior to Visit  Medication Dose Route Frequency Provider Last Rate Last Dose  . pneumococcal 23 valent vaccine (PNU-IMMUNE) injection 0.5 mL  0.5 mL Intramuscular Once Oliver Barre, MD       Review of Systems Review of Systems  Constitutional: Negative for diaphoresis and unexpected weight change.  HENT: Negative for drooling and tinnitus.   Eyes: Negative for photophobia and visual disturbance.  Respiratory: Negative for choking and stridor.   Gastrointestinal: Negative for vomiting and blood in stool.  Genitourinary: Negative for hematuria and decreased urine volume.   Physical Exam BP 170/88  Pulse 67  Temp(Src) 96.8 F (36 C) (Oral)  Ht 5' 6.5" (1.689 m)  Wt 181 lb 12.8 oz (82.464 kg)  BMI 28.90 kg/m2  SpO2 97% Physical Exam  VS noted, not ill appearing, right handed Constitutional: Pt appears well-developed and well-nourished.  HENT: Head: Normocephalic.  Right Ear: External ear normal.  Left Ear: External ear normal.  Eyes: Conjunctivae and EOM are normal. Pupils are equal, round, and reactive to light.  Neck: Normal range of motion. Neck supple.  Cardiovascular: Normal rate and regular  rhythm.   Pulmonary/Chest: Effort normal and breath sounds normal.  Abd:  Soft, NT, non-distended, + BS Neurological: Pt is alert. No cranial nerve deficit. motor 4+/5 distal weakness o/w intact, sens intact, dtr symmetric Skin: Skin is warm. No erythema.  Psychiatric: Pt behavior is normal. Thought content normal.     Assessment & Plan:

## 2011-10-02 ENCOUNTER — Ambulatory Visit (INDEPENDENT_AMBULATORY_CARE_PROVIDER_SITE_OTHER)
Admission: RE | Admit: 2011-10-02 | Discharge: 2011-10-02 | Disposition: A | Discharge status: Home / Self Care | Payer: Medicare Other | Source: Ambulatory Visit | Attending: Internal Medicine | Admitting: Internal Medicine

## 2011-10-02 DIAGNOSIS — R29898 Other symptoms and signs involving the musculoskeletal system: Secondary | ICD-10-CM

## 2011-10-02 DIAGNOSIS — R2990 Unspecified symptoms and signs involving the nervous system: Secondary | ICD-10-CM

## 2011-10-02 DIAGNOSIS — I639 Cerebral infarction, unspecified: Secondary | ICD-10-CM

## 2011-10-02 DIAGNOSIS — R29818 Other symptoms and signs involving the nervous system: Secondary | ICD-10-CM

## 2011-10-02 HISTORY — DX: Cerebral infarction, unspecified: I63.9

## 2011-10-07 ENCOUNTER — Ambulatory Visit: Payer: Medicare Other | Admitting: Internal Medicine

## 2011-10-07 DIAGNOSIS — Z0289 Encounter for other administrative examinations: Secondary | ICD-10-CM

## 2011-10-08 ENCOUNTER — Ambulatory Visit (INDEPENDENT_AMBULATORY_CARE_PROVIDER_SITE_OTHER): Payer: Medicare Other | Admitting: Internal Medicine

## 2011-10-08 ENCOUNTER — Encounter: Payer: Self-pay | Admitting: Internal Medicine

## 2011-10-08 VITALS — BP 162/80 | HR 74 | Temp 97.0°F | Ht 64.0 in | Wt 183.4 lb

## 2011-10-08 DIAGNOSIS — R29898 Other symptoms and signs involving the musculoskeletal system: Secondary | ICD-10-CM

## 2011-10-08 DIAGNOSIS — I10 Essential (primary) hypertension: Secondary | ICD-10-CM

## 2011-10-08 DIAGNOSIS — I639 Cerebral infarction, unspecified: Secondary | ICD-10-CM

## 2011-10-08 DIAGNOSIS — I6350 Cerebral infarction due to unspecified occlusion or stenosis of unspecified cerebral artery: Secondary | ICD-10-CM

## 2011-10-08 MED ORDER — AMLODIPINE BESYLATE 10 MG PO TABS: 10.0000 mg | ORAL_TABLET | Freq: Every day | ORAL | Status: DC

## 2011-10-08 NOTE — Progress Notes (Signed)
Subjective:    Patient ID: [name], male    DOB: 10/19/1940, 71 y.o.   MRN: [id number]  HPI  Here to f/u;  Recent CT brain neg for acute CVA, but still with vague complaints or "drawing sensation" to the RLE and weakness, difficulty with ambulation, though does not relate recent hx of LBP, though has had some pain in the past.  Pt denies chest pain, increased sob or doe, wheezing, orthopnea, PND, increased LE swelling, palpitations, dizziness or syncope.  Pt denies new neurological symptoms such as new headache, or facial or extremity weakness or numbness other than the above.   Pt denies polydipsia, polyuria.  Has good compliance with current meds and states tolerating well.   Pt denies fever, wt loss, night sweats, loss of appetite, or other constitutional symptoms Past Medical History  Diagnosis Date  . Hypertension     echocardiogram 6/10: Moderate LVH, EF 55-65%, mild, mild MR, MAC, mild LAE, PASP 32  . Dyslipidemia   . History of stroke   . CAD (coronary artery disease)     cath in 1/08: EF 60%, mild dilated Ao root, oD2 30%, LAD 20-30%.  . Paroxysmal atrial fibrillation   . Anemia, iron deficiency 08/15/2011  . History of pneumonia 08/15/2011  . History of MI (myocardial infarction) 08/15/2011  . 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 Cardiac catheterization     reports that he has never smoked. He has never used smokeless tobacco. He reports that he does not drink alcohol or use illicit drugs. family history includes Aneurysm in his sister.  There is no history of Colon cancer. No Known Allergies Current Outpatient Prescriptions on File Prior to Visit  Medication Sig Dispense Refill  . aspirin 81 MG EC tablet Take 1 tablet (81 mg total) by mouth daily. Swallow whole.  30 tablet  12  . castor oil liquid Take by mouth. AS DIRECTED       . cloNIDine (CATAPRES) 0.1 MG tablet Take 1 tablet (0.1 mg total) by mouth 2 (two) times daily.  180 tablet   3  . fish oil-omega-3 fatty acids 1000 MG capsule Take 2 g by mouth daily.        Marland Kitchen ibuprofen (ADVIL,MOTRIN) 200 MG tablet Take 200 mg by mouth as needed.        Marland Kitchen lisinopril-hydrochlorothiazide (ZESTORETIC) 20-12.5 MG per tablet Take 1 tablet by mouth daily.  90 tablet  3  . lovastatin (MEVACOR) 40 MG tablet Take 1 tablet (40 mg total) by mouth at bedtime.  90 tablet  3  . Sennosides (EX-LAX PO) Take by mouth. AS DIRECTED PRN       . peg 3350 powder (MOVIPREP) 100 G SOLR Take 1 kit (100 g total) by mouth once.  1 kit  0   Current Facility-Administered Medications on File Prior to Visit  Medication Dose Route Frequency Provider Last Rate Last Dose  . DISCONTD: pneumococcal 23 valent vaccine (PNU-IMMUNE) injection 0.5 mL  0.5 mL Intramuscular Once Oliver Barre, MD       Review of Systems Review of Systems  Constitutional: Negative for diaphoresis and unexpected weight change.  HENT: Negative for drooling and tinnitus.   Eyes: Negative for photophobia and visual disturbance.  Respiratory: Negative for choking and stridor.   Gastrointestinal: Negative for vomiting and blood in stool.  Genitourinary: Negative for hematuria and decreased urine volume.  Objective:   Physical Exam BP 162/80  Pulse 74  Temp(Src) 97 F (36.1 C) (Oral)  Ht 5\' 4"  (1.626 m)  Wt 183 lb 6.4 oz (83.19 kg)  BMI 31.48 kg/m2  SpO2 95% Physical Exam  VS noted Constitutional: Pt appears well-developed and well-nourished.  HENT: Head: Normocephalic.  Right Ear: External ear normal.  Left Ear: External ear normal.  Eyes: Conjunctivae and EOM are normal. Pupils are equal, round, and reactive to light.  Neck: Normal range of motion. Neck supple.  Cardiovascular: Normal rate and regular rhythm.   Pulmonary/Chest: Effort normal and breath sounds normal.  Abd:  Soft, NT, non-distended, + BS Neurological: Pt is alert. No cranial nerve deficit.  O/w no change from previous  Skin: Skin is warm. No erythema.  Psychiatric:  Pt behavior is normal. 1+ nervous    Assessment & Plan:

## 2011-10-08 NOTE — Assessment & Plan Note (Signed)
Improved, but still uncontrolled - to add amlod 10, check BP at home and next visit,  to f/u any worsening symptoms or concerns

## 2011-10-08 NOTE — Assessment & Plan Note (Signed)
Unclear etiology, for MRI LS spine, refer neurology,  to f/u any worsening symptoms or concerns

## 2011-10-08 NOTE — Patient Instructions (Signed)
Take all new medications as prescribed  - the amlodipine 10 mg for blood pressure (one per day) - sent to your pharmacy Continue all other medications as before;  OK to take ALL of your other medications you are already taking You will be contacted regarding the referral for: MRI for the lower back, as well as the Neurology referral If the MRI shows a pinched nerve, we may need to cancel the Neurology, but refer to Neurosurgury instead Please return in 1 month, or sooner if needed, to re-check the Blood Pressure, as well as the cholesterol on the medication you take now

## 2011-10-08 NOTE — Assessment & Plan Note (Signed)
stable overall by hx and exam, most recent data reviewed with pt, and pt to continue medical treatment as before 

## 2011-10-09 ENCOUNTER — Encounter: Payer: Self-pay | Admitting: Neurology

## 2011-10-12 ENCOUNTER — Inpatient Hospital Stay: Admission: RE | Admit: 2011-10-12 | Payer: Medicare Other | Source: Ambulatory Visit

## 2011-10-15 ENCOUNTER — Ambulatory Visit: Payer: Medicare Other | Admitting: Physician Assistant

## 2011-10-20 ENCOUNTER — Telehealth: Payer: Self-pay | Admitting: *Deleted

## 2011-10-20 ENCOUNTER — Encounter: Payer: Self-pay | Admitting: *Deleted

## 2011-10-20 NOTE — Telephone Encounter (Signed)
Pt no show for previsit.  Sent no show letter.   [name]

## 2011-11-02 ENCOUNTER — Encounter: Payer: Self-pay | Admitting: Gastroenterology

## 2011-11-03 ENCOUNTER — Encounter: Payer: Medicare Other | Admitting: Gastroenterology

## 2011-11-06 ENCOUNTER — Encounter: Payer: Medicare Other | Admitting: Cardiology

## 2011-11-18 ENCOUNTER — Ambulatory Visit: Payer: Medicare Other | Admitting: Internal Medicine

## 2011-11-18 DIAGNOSIS — Z0289 Encounter for other administrative examinations: Secondary | ICD-10-CM

## 2011-11-19 ENCOUNTER — Ambulatory Visit (INDEPENDENT_AMBULATORY_CARE_PROVIDER_SITE_OTHER): Payer: Medicare Other | Admitting: Endocrinology

## 2011-11-19 ENCOUNTER — Encounter (HOSPITAL_COMMUNITY): Payer: Self-pay | Admitting: *Deleted

## 2011-11-19 ENCOUNTER — Emergency Department (HOSPITAL_COMMUNITY): Payer: Medicare Other

## 2011-11-19 ENCOUNTER — Inpatient Hospital Stay (HOSPITAL_COMMUNITY)
Admission: EM | Admit: 2011-11-19 | Discharge: 2011-11-23 | DRG: 066 | Disposition: A | Discharge status: Home / Self Care | Payer: Medicare Other | Attending: Internal Medicine | Admitting: Internal Medicine

## 2011-11-19 VITALS — BP 132/82 | HR 69 | Temp 97.8°F | Wt 186.0 lb

## 2011-11-19 DIAGNOSIS — R42 Dizziness and giddiness: Secondary | ICD-10-CM

## 2011-11-19 DIAGNOSIS — I10 Essential (primary) hypertension: Secondary | ICD-10-CM | POA: Diagnosis present

## 2011-11-19 DIAGNOSIS — E860 Dehydration: Secondary | ICD-10-CM | POA: Diagnosis present

## 2011-11-19 DIAGNOSIS — Z0000 Encounter for general adult medical examination without abnormal findings: Secondary | ICD-10-CM

## 2011-11-19 DIAGNOSIS — R269 Unspecified abnormalities of gait and mobility: Secondary | ICD-10-CM

## 2011-11-19 DIAGNOSIS — N183 Chronic kidney disease, stage 3 unspecified: Secondary | ICD-10-CM | POA: Diagnosis present

## 2011-11-19 DIAGNOSIS — D649 Anemia, unspecified: Secondary | ICD-10-CM | POA: Diagnosis present

## 2011-11-19 DIAGNOSIS — N529 Male erectile dysfunction, unspecified: Secondary | ICD-10-CM

## 2011-11-19 DIAGNOSIS — N400 Enlarged prostate without lower urinary tract symptoms: Secondary | ICD-10-CM

## 2011-11-19 DIAGNOSIS — I4891 Unspecified atrial fibrillation: Secondary | ICD-10-CM | POA: Diagnosis present

## 2011-11-19 DIAGNOSIS — I77810 Thoracic aortic ectasia: Secondary | ICD-10-CM

## 2011-11-19 DIAGNOSIS — R29898 Other symptoms and signs involving the musculoskeletal system: Secondary | ICD-10-CM

## 2011-11-19 DIAGNOSIS — R351 Nocturia: Secondary | ICD-10-CM

## 2011-11-19 DIAGNOSIS — E785 Hyperlipidemia, unspecified: Secondary | ICD-10-CM | POA: Diagnosis present

## 2011-11-19 DIAGNOSIS — Z8701 Personal history of pneumonia (recurrent): Secondary | ICD-10-CM

## 2011-11-19 DIAGNOSIS — I639 Cerebral infarction, unspecified: Secondary | ICD-10-CM | POA: Diagnosis present

## 2011-11-19 DIAGNOSIS — I6350 Cerebral infarction due to unspecified occlusion or stenosis of unspecified cerebral artery: Principal | ICD-10-CM | POA: Diagnosis present

## 2011-11-19 DIAGNOSIS — I739 Peripheral vascular disease, unspecified: Secondary | ICD-10-CM

## 2011-11-19 DIAGNOSIS — I129 Hypertensive chronic kidney disease with stage 1 through stage 4 chronic kidney disease, or unspecified chronic kidney disease: Secondary | ICD-10-CM | POA: Diagnosis present

## 2011-11-19 DIAGNOSIS — I2510 Atherosclerotic heart disease of native coronary artery without angina pectoris: Secondary | ICD-10-CM

## 2011-11-19 DIAGNOSIS — D509 Iron deficiency anemia, unspecified: Secondary | ICD-10-CM

## 2011-11-19 DIAGNOSIS — I252 Old myocardial infarction: Secondary | ICD-10-CM

## 2011-11-19 DIAGNOSIS — D631 Anemia in chronic kidney disease: Secondary | ICD-10-CM | POA: Diagnosis present

## 2011-11-19 DIAGNOSIS — I480 Paroxysmal atrial fibrillation: Secondary | ICD-10-CM | POA: Diagnosis not present

## 2011-11-19 DIAGNOSIS — R55 Syncope and collapse: Secondary | ICD-10-CM | POA: Diagnosis present

## 2011-11-19 DIAGNOSIS — R21 Rash and other nonspecific skin eruption: Secondary | ICD-10-CM

## 2011-11-19 LAB — DIFFERENTIAL
Basophils Relative: 0 % (ref 0–1)
Eosinophils Relative: 1 % (ref 0–5)
Lymphocytes Relative: 22 % (ref 12–46)
Monocytes Relative: 6 % (ref 3–12)
Neutrophils Relative %: 71 % (ref 43–77)

## 2011-11-19 LAB — CBC
Hemoglobin: 11.8 g/dL — ABNORMAL LOW (ref 13.0–17.0)
MCH: 21.5 pg — ABNORMAL LOW (ref 26.0–34.0)
RBC: 5.5 MIL/uL (ref 4.22–5.81)
WBC: 6.5 10*3/uL (ref 4.0–10.5)

## 2011-11-19 LAB — BASIC METABOLIC PANEL
CO2: 25 mEq/L (ref 19–32)
Chloride: 100 mEq/L (ref 96–112)
Glucose, Bld: 119 mg/dL — ABNORMAL HIGH (ref 70–99)
Potassium: 4.4 mEq/L (ref 3.5–5.1)
Sodium: 138 mEq/L (ref 135–145)

## 2011-11-19 LAB — CARDIAC PANEL(CRET KIN+CKTOT+MB+TROPI)
CK, MB: 4.2 ng/mL — ABNORMAL HIGH (ref 0.3–4.0)
Troponin I: <0.3 ng/mL (ref <0.30)

## 2011-11-19 LAB — APTT: aPTT: 28 seconds (ref 24–37)

## 2011-11-19 LAB — URINALYSIS, ROUTINE W REFLEX MICROSCOPIC
Bilirubin Urine: NEGATIVE
Glucose, UA: NEGATIVE mg/dL
Hgb urine dipstick: NEGATIVE
Protein, ur: NEGATIVE mg/dL
Urobilinogen, UA: 1 mg/dL (ref 0.0–1.0)

## 2011-11-19 LAB — PROTIME-INR: Prothrombin Time: 13.8 seconds (ref 11.6–15.2)

## 2011-11-19 MED ORDER — ASPIRIN EC 81 MG PO TBEC
81.0000 mg | DELAYED_RELEASE_TABLET | Freq: Every day | ORAL | Status: DC
  Administered 2011-11-20: 81 mg via ORAL
  Filled 2011-11-19 (×2): qty 1

## 2011-11-19 MED ORDER — ACETAMINOPHEN 325 MG PO TABS: 650.0000 mg | ORAL_TABLET | Freq: Four times a day (QID) | ORAL | Status: DC | PRN

## 2011-11-19 MED ORDER — SODIUM CHLORIDE 0.9 % IV SOLN
INTRAVENOUS | Status: DC
  Administered 2011-11-19: 23:00:00 via INTRAVENOUS

## 2011-11-19 MED ORDER — OMEGA-3-ACID ETHYL ESTERS 1 G PO CAPS
2.0000 g | ORAL_CAPSULE | Freq: Every day | ORAL | Status: DC
  Administered 2011-11-20 – 2011-11-23 (×4): 2 g via ORAL
  Filled 2011-11-19 (×5): qty 2

## 2011-11-19 MED ORDER — HEPARIN SODIUM (PORCINE) 5000 UNIT/ML IJ SOLN
5000.0000 [IU] | Freq: Three times a day (TID) | INTRAMUSCULAR | Status: DC
  Administered 2011-11-19 – 2011-11-23 (×12): 5000 [IU] via SUBCUTANEOUS
  Filled 2011-11-19 (×17): qty 1

## 2011-11-19 MED ORDER — SODIUM CHLORIDE 0.9 % IV SOLN
250.0000 mL | INTRAVENOUS | Status: DC | PRN
  Administered 2011-11-20: 250 mL via INTRAVENOUS

## 2011-11-19 MED ORDER — ACETAMINOPHEN 650 MG RE SUPP: 650.0000 mg | Freq: Four times a day (QID) | RECTAL | Status: DC | PRN

## 2011-11-19 MED ORDER — FOLDING WALKER/ADULT MISC: 1.0000 | Freq: Once | Status: DC

## 2011-11-19 MED ORDER — OXYCODONE HCL 5 MG PO TABS: 5.0000 mg | ORAL_TABLET | ORAL | Status: DC | PRN

## 2011-11-19 MED ORDER — CLONIDINE HCL 0.1 MG PO TABS
0.1000 mg | ORAL_TABLET | Freq: Two times a day (BID) | ORAL | Status: DC
  Administered 2011-11-19 – 2011-11-23 (×8): 0.1 mg via ORAL
  Filled 2011-11-19 (×11): qty 1

## 2011-11-19 MED ORDER — HYDRALAZINE HCL 20 MG/ML IJ SOLN: 10.0000 mg | Freq: Four times a day (QID) | INTRAMUSCULAR | Status: DC | PRN

## 2011-11-19 MED ORDER — ONDANSETRON HCL 4 MG PO TABS: 4.0000 mg | ORAL_TABLET | Freq: Four times a day (QID) | ORAL | Status: DC | PRN

## 2011-11-19 MED ORDER — ONDANSETRON HCL 4 MG/2ML IJ SOLN: 4.0000 mg | Freq: Four times a day (QID) | INTRAMUSCULAR | Status: DC | PRN

## 2011-11-19 MED ORDER — MORPHINE SULFATE 2 MG/ML IJ SOLN: 2.0000 mg | INTRAMUSCULAR | Status: DC | PRN

## 2011-11-19 MED ORDER — SODIUM CHLORIDE 0.9 % IJ SOLN
3.0000 mL | Freq: Two times a day (BID) | INTRAMUSCULAR | Status: DC
  Administered 2011-11-21 – 2011-11-23 (×4): 3 mL via INTRAVENOUS

## 2011-11-19 MED ORDER — SODIUM CHLORIDE 0.9 % IJ SOLN
3.0000 mL | Freq: Two times a day (BID) | INTRAMUSCULAR | Status: DC
  Administered 2011-11-21 – 2011-11-23 (×2): 3 mL via INTRAVENOUS

## 2011-11-19 MED ORDER — SODIUM CHLORIDE 0.9 % IJ SOLN: 3.0000 mL | INTRAMUSCULAR | Status: DC | PRN

## 2011-11-19 MED ORDER — MECLIZINE HCL 25 MG PO TABS
25.0000 mg | ORAL_TABLET | Freq: Once | ORAL | Status: AC
  Administered 2011-11-19: 25 mg via ORAL
  Filled 2011-11-19: qty 1

## 2011-11-19 NOTE — ED Notes (Signed)
MD at bedside. 

## 2011-11-19 NOTE — ED Notes (Signed)
Pt reports dizziness and vision problems x2-3 days. Reports N/V starting approx 1 hour ago. Denies chest pain or sob. NAD noted at this time.

## 2011-11-19 NOTE — Progress Notes (Signed)
Subjective:    Patient ID: [name], male    DOB: 05/11/1941, 71 y.o.   MRN: [id number]  HPI Pt states a few weeks of moderate dizziness sensation in the head.  He was ref to neurol, but he missed appt yesterday.  No assoc headache.  Denies fever and visual loss.   Past Medical History  Diagnosis Date  . Hypertension     echocardiogram 6/10: Moderate LVH, EF 55-65%, mild, mild MR, MAC, mild LAE, PASP 32  . Dyslipidemia   . History of stroke   . CAD (coronary artery disease)     cath in 1/08: EF 60%, mild dilated Ao root, oD2 30%, LAD 20-30%.  . Paroxysmal atrial fibrillation   . Anemia, iron deficiency 08/15/2011  . History of pneumonia 08/15/2011  . History of MI (myocardial infarction) 08/15/2011  . 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 Cardiac catheterization     History   Social History  . Marital Status: Widowed    Spouse Name: N/A    Number of Children: 2  . Years of Education: N/A   Occupational History  . retired    Social History Main Topics  . Smoking status: Never Smoker   . Smokeless tobacco: Never Used  . Alcohol Use: No  . Drug Use: No  . Sexually Active: Not on file   Other Topics Concern  . Not on file   Social History Narrative  . No narrative on file    No current facility-administered medications on file prior to visit.   Current Outpatient Prescriptions on File Prior to Visit  Medication Sig Dispense Refill  . aspirin 81 MG EC tablet Take 1 tablet (81 mg total) by mouth daily. Swallow whole.  30 tablet  12  . cloNIDine (CATAPRES) 0.1 MG tablet Take 1 tablet (0.1 mg total) by mouth 2 (two) times daily.  180 tablet  3  . fish oil-omega-3 fatty acids 1000 MG capsule Take 2 g by mouth daily.        Marland Kitchen lisinopril-hydrochlorothiazide (ZESTORETIC) 20-12.5 MG per tablet Take 1 tablet by mouth daily.  90 tablet  3  . castor oil liquid Take 5 mLs by mouth daily as needed. For constipation.      Marland Kitchen ibuprofen  (ADVIL,MOTRIN) 200 MG tablet Take 400 mg by mouth every 8 (eight) hours as needed. For pain.        No Known Allergies  Family History  Problem Relation Age of Onset  . Colon cancer Neg Hx   . Aneurysm Sister     BP 132/82  Pulse 69  Temp(Src) 97.8 F (36.6 C) (Oral)  Wt 186 lb (84.369 kg)  SpO2 99%  Review of Systems denies loc and LOC.      Objective:   Physical Exam VS: see vs page GEN: no distress HEAD: head: no deformity eyes: no periorbital swelling, no proptosis external nose and ears are normal mouth: no lesion seen Both eac's and tm's are normal CHEST WALL: no deformity LUNGS: clear to auscultation CV: reg rate and rhythm, no murmur MUSCULOSKELETAL: muscle bulk and strength are grossly normal.  no obvious joint swelling.  gait is normal and steady.   EXTEMITIES: no deformity.  no ulcer on the feet.  feet are of normal color and temp.  no edema PULSES:  no carotid bruit NEURO:  cn 2-12 grossly intact.   readily moves all 4's.  sensation is intact to touch  on the feet.   SKIN:  Normal texture and temperature.  Not diaphoretic.   NODES:  None palpable at the neck PSYCH: alert, oriented x3.  Does not appear anxious nor depressed. Lab Results  Component Value Date   WBC 6.2 11/20/2011   HGB 11.4* 11/20/2011   HCT 36.9* 11/20/2011   PLT 182 11/20/2011   GLUCOSE 88 11/20/2011   CHOL 212* 11/19/2011   TRIG 74 11/19/2011   HDL 47 11/19/2011   LDLDIRECT 200.5 04/01/2011   LDLCALC 150* 11/19/2011   ALT 16 11/20/2011   AST 19 11/20/2011   NA 138 11/20/2011   K 3.5 11/20/2011   CL 102 11/20/2011   CREATININE 1.23 11/20/2011   BUN 24* 11/20/2011   CO2 27 11/20/2011   TSH 0.614 11/19/2011   INR 1.04 11/19/2011      Assessment & Plan:  Dizziness, which has a long DDX.  No evidence of acute cva .  therapy limited by noncompliance.  i'll do the best i can. Gait difficulty, uncertain etiology Anemia, stable

## 2011-11-19 NOTE — ED Notes (Signed)
[name]   [phone]

## 2011-11-19 NOTE — H&P (Signed)
PCP:   Oliver Barre, MD, MD  [place] Cardiologist: Dr Shawnie Pons. Chief Complaint:  Dizziness, faintness for one day.  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 [place], when he started feeling dizzy and light-headed, without vertigo, or tinnitus, felt as if he was about to faint, experienced nausea and heaving, but no vomiting, chest pain, palpitations or shortness of breath. He tok a bus, and went to see his primary MD, was evaluated and allowed home. However, while waiting for his partner to pick him up, symptoms recurred, so he called a friend on his cell phone, who came by, and brought him to the ED.  Symptoms have become progressive.  Allergies:  No Known Allergies    Past Medical History  Diagnosis Date  . Hypertension     echocardiogram 6/10: Moderate LVH, EF 55-65%, mild, mild MR, MAC, mild LAE, PASP 32  . Dyslipidemia   . History of stroke   . CAD (coronary artery disease)     cath in 1/08: EF 60%, mild dilated Ao root, oD2 30%, LAD 20-30%.  . Paroxysmal atrial fibrillation   . Anemia, iron deficiency 08/15/2011  . History of pneumonia 08/15/2011  . History of MI (myocardial infarction) 08/15/2011  . 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 Cardiac catheterization     Prior to Admission medications   Medication Sig Start Date End Date Taking? Authorizing Provider  aspirin 81 MG EC tablet Take 1 tablet (81 mg total) by mouth daily. Swallow whole. 10/01/11 09/30/12 Yes Corwin Levins, MD  castor oil liquid Take 5 mLs by mouth  daily as needed. For constipation.   Yes Historical Provider, MD  cloNIDine (CATAPRES) 0.1 MG tablet Take 1 tablet (0.1 mg total) by mouth 2 (two) times daily. 10/01/11 09/30/12 Yes Corwin Levins, MD  fish oil-omega-3 fatty acids 1000 MG capsule Take 2 g by mouth daily.     Yes Historical Provider, MD  ibuprofen (ADVIL,MOTRIN) 200 MG tablet Take 400 mg by mouth every 8 (eight) hours as needed. For pain.   Yes Historical Provider, MD  lisinopril-hydrochlorothiazide (ZESTORETIC) 20-12.5 MG per tablet Take 1 tablet by mouth daily. 10/01/11 09/30/12 Yes Corwin Levins, MD  Misc. Devices (FOLDING WALKER/ADULT) MISC 1 Device by Does not apply route once. 11/19/11   Romero Belling, MD    Social History:  reports that he has never smoked. He has never used smokeless tobacco. He reports that he does not drink alcohol or use illicit drugs.  Family History  Problem Relation Age of Onset  . Colon cancer Neg Hx   . Aneurysm Sister     Review of Systems:  As per HPI and chief complaint. Patent denies fatigue, diminished appetite, weight loss, fever, chills, headache, blurred vision, difficulty in speaking, dysphagia, chest pain, cough, shortness of breath, orthopnea, paroxysmal nocturnal dyspnea, nausea, diaphoresis, abdominal pain, vomiting, diarrhea, belching, heartburn, hematemesis, melena. He has experienced urinary frequency and nocturia recently, without dysuria, has erectile dysfunction, but denies hematochezia, lower extremity swelling, pain, or redness. The rest of the systems review is negative.  Physical  Exam: General:  Patient does not appear to be in obvious acute distress. Alert, communicative, fully oriented, talking in complete sentences, not short of breath at rest.  HEENT:  Mild clinical pallor, no jaundice, no conjunctival injection or discharge. Buccal mucosa appears "dry". NECK:  Supple, JVP not seen, no carotid bruits, no palpable lymphadenopathy, no palpable goiter. CHEST:  Clinically clear to  auscultation, no wheezes, no crackles. HEART:  Sounds 1 and 2 heard, normal, regular, no murmurs. ABDOMEN:  Full, soft, no scars, non-tender, no palpable organomegaly, no palpable masses, normal bowel sounds. GENITALIA:  Not examined. LOWER EXTREMITIES:  No pitting edema, palpable peripheral pulses. MUSCULOSKELETAL SYSTEM:  Generalized osteoarthritic changes, patient has a supra-numerary digit in the left hand, otherwise, normal. CENTRAL NERVOUS SYSTEM:  No focal neurologic deficit on gross examination. Patient ambulates with a wide stance, but Romberg's sign is negative, and there is no past-pointing or nystagmus.  Labs on Admission:  Results for orders placed during the hospital encounter of 11/19/11 (from the past 48 hour(s))  CBC     Status: Abnormal   Collection Time   11/19/11  6:25 PM      Component Value Range Comment   WBC 6.5  4.0 - 10.5 (K/uL)    RBC 5.50  4.22 - 5.81 (MIL/uL)    Hemoglobin 11.8 (*) 13.0 - 17.0 (g/dL)    HCT 16.1 (*) 09.6 - 52.0 (%)    MCV 68.2 (*) 78.0 - 100.0 (fL)    MCH 21.5 (*) 26.0 - 34.0 (pg)    MCHC 31.5  30.0 - 36.0 (g/dL)    RDW 04.5  40.9 - 81.1 (%)    Platelets 183  150 - 400 (K/uL)   DIFFERENTIAL     Status: Normal   Collection Time   11/19/11  6:25 PM      Component Value Range Comment   Neutrophils Relative 71  43 - 77 (%)    Lymphocytes Relative 22  12 - 46 (%)    Monocytes Relative 6  3 - 12 (%)    Eosinophils Relative 1  0 - 5 (%)    Basophils Relative 0  0 - 1 (%)    Neutro Abs 4.6  1.7 - 7.7 (K/uL)    Lymphs Abs 1.4  0.7 - 4.0 (K/uL)    Monocytes Absolute 0.4  0.1 - 1.0 (K/uL)    Eosinophils Absolute 0.1  0.0 - 0.7 (K/uL)    Basophils Absolute 0.0  0.0 - 0.1 (K/uL)    Smear Review PLATELET COUNT CONFIRMED BY SMEAR     BASIC METABOLIC PANEL     Status: Abnormal   Collection Time   11/19/11  6:25 PM      Component Value Range Comment   Sodium 138  135 - 145 (mEq/L)    Potassium 4.4  3.5 - 5.1 (mEq/L)    Chloride 100  96 - 112  (mEq/L)    CO2 25  19 - 32 (mEq/L)    Glucose, Bld 119 (*) 70 - 99 (mg/dL)    BUN 30 (*) 6 - 23 (mg/dL)    Creatinine, Ser 9.14 (*) 0.50 - 1.35 (mg/dL)    Calcium 9.1  8.4 - 10.5 (mg/dL)    GFR calc non Af Amer 43 (*) >90 (mL/min)    GFR calc Af Amer 49 (*) >90 (mL/min)   CARDIAC PANEL(CRET KIN+CKTOT+MB+TROPI)     Status: Abnormal   Collection Time   11/19/11  6:25 PM  Component Value Range Comment   Total CK 276 (*) 7 - 232 (U/L)    CK, MB 4.2 (*) 0.3 - 4.0 (ng/mL)    Troponin I <0.30  <0.30 (ng/mL)    Relative Index 1.5  0.0 - 2.5    APTT     Status: Normal   Collection Time   11/19/11  6:25 PM      Component Value Range Comment   aPTT 28  24 - 37 (seconds)   PROTIME-INR     Status: Normal   Collection Time   11/19/11  6:25 PM      Component Value Range Comment   Prothrombin Time 13.8  11.6 - 15.2 (seconds)    INR 1.04  0.00 - 1.49      Radiological Exams on Admission: *RADIOLOGY REPORT*  Clinical Data: Chest pain.  CHEST - 2 VIEW  Comparison: PA and lateral chest 03/03/2011.  Findings: There is cardiomegaly but no edema. Lungs are clear. No pneumothorax or effusion.  IMPRESSION: Cardiomegaly without acute disease.  Original Report Authenticated By: Bernadene Bell. D'ALESSIO, M.D.  *RADIOLOGY REPORT*  Clinical Data: Dizziness. Ataxia.  CT HEAD WITHOUT CONTRAST  Technique: Contiguous axial images were obtained from the base of the skull through the vertex without contrast.  Comparison: Head CT scan 10/02/2011.  Findings: Again seen is chronic microvascular ischemic change and some cortical atrophy. No evidence of acute abnormality including infarction, hemorrhage, mass lesion, mass effect, midline shift or abnormal extra-axial fluid collection. Minimal mucosal thickening left maxillary sinus is noted. Calvarium is intact.  IMPRESSION: No acute finding. Stable compared to prior exam.  Original Report Authenticated By: Bernadene Bell. Maricela Curet,  M.D.  Assessment/Plan Principal Problem:  *Pre-syncope: Patient presented as described above, and physical examination has revealed no focal neurologic deficit. At this point, differential diagnostic considerations include faintness, dizziness /orthostasis from dehydration, vs posterior circulation insufficiency, against a known background of cerebrovascular disease, HTN, dyslipidemia and PAF. We shall admit patient, pace him on telemetric monitoring, rehydrate with iv fluids, check orthostatics, do brain MRI/MRA and carotid/vertebral artery duplex scan. Meanwhile, will continue ASA.  Active Problems:  1. Dehydration: Patient has an elevated BUN/Creatinine ratio, consistent with dehydration, and admits to recent urinary frequency/nocturia. As suggested above, dehydration may indeed be the culprit for patient's presenting symptoms, on the basis of orthostasis. We shall manage with iv fluids and discontinue diuretics.  2. CKD (chronic kidney disease), stage III: Patient has known CKD, with baseline creatinine of 1.6 on 05/06/11. Renal indices appear to be at baseline, at this time. For now, we shall hold ACE-i.  3. HTN (hypertension): BP is somewhat elevated in the ED. We shall monitor, and address as indicated.  4. Dyslipidemia: Continue pre-admission lipid-lowering medication and check Lipid profile.  5. Anemia: Patient has a mild microcytic anemia, and a known history of iron deficiency anemia, documented in 2008. Unfortunately, he never kept his appointment for endosopic evaluation. We shall check hematinic studies, and defer to PMD to arrange outpatient GI work up.  6. PAF (paroxysmal atrial fibrillation): Patient is currently in SR, and even though he was followed by cardiologist in the past, he was not placed on anticoagulation. This will need clarification. We shall arrange 2 D echocardiogram and check TSH. 7. CAD: Asymptomatic. 8. Prostatism: Likely secondary to prostate enlargement. We shall  check Urinalysis for completeness, but defer urology referral to PMD.  Time Spent on Admission: 1 hour.  Garnet Chatmon,CHRISTOPHER 11/19/2011, 8:34 PM

## 2011-11-19 NOTE — ED Notes (Signed)
Pt returned from CT scan. NAD noted at this time. 

## 2011-11-19 NOTE — ED Provider Notes (Addendum)
History     CSN: [id number]  Arrival date & time 11/19/11  1731   First MD Initiated Contact with Patient 11/19/11 1757      Chief Complaint  Patient presents with  . Dizziness    pt reports sudden onset of dizziness about ago. also reports nausea no vomiting. no facial droop or weakness noted.   . Nausea    (Consider location/radiation/quality/duration/timing/severity/associated sxs/prior treatment) HPI Comments: Patient presents complaining of dizziness and lightheadedness today.  Patient states that he's had dizziness when he was walking around downtown and he felt like he might pass out.  Patient did not actually lose consciousness.  He denies any headache.  He's had no trauma.  No chest pain or shortness of breath with this.  Patient notes no weakness, numbness or slurred speech.  He did go to his primary care physician's office today for reevaluation for this dizziness.  In reviewing the note from today's visit despite the patient saying that dizziness is new today he has been having this for several weeks and had been referred to neuro but missed his appointment.  In the office he appeared to have a normal exam and was discharge.  Patient left there and said he continued to feel worse and so decided to come to the emergency department.  Here the patient is having problems with steadiness on his feet.  He states he still has some mild dizziness but it is not a room spinning sensation.  He had nausea earlier but no nausea at this time.  Patient is a 71 y.o. male presenting with neurologic complaint. The history is provided by the patient. No language interpreter was used.  Neurologic Problem The primary symptoms include dizziness and nausea. Primary symptoms do not include headaches, syncope, loss of consciousness, altered mental status, seizures, visual change, paresthesias, focal weakness, loss of sensation, speech change, memory loss, fever or vomiting.  Dizziness also occurs with  nausea. Dizziness does not occur with vomiting.    Past Medical History  Diagnosis Date  . Hypertension     echocardiogram 6/10: Moderate LVH, EF 55-65%, mild, mild MR, MAC, mild LAE, PASP 32  . Dyslipidemia   . History of stroke   . CAD (coronary artery disease)     cath in 1/08: EF 60%, mild dilated Ao root, oD2 30%, LAD 20-30%.  . Paroxysmal atrial fibrillation   . Anemia, iron deficiency 08/15/2011  . History of pneumonia 08/15/2011  . History of MI (myocardial infarction) 08/15/2011  . 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 Cardiac catheterization     Family History  Problem Relation Age of Onset  . Colon cancer Neg Hx   . Aneurysm Sister     History  Substance Use Topics  . Smoking status: Never Smoker   . Smokeless tobacco: Never Used  . Alcohol Use: No      Review of Systems  Constitutional: Negative.  Negative for fever and chills.  HENT: Negative.   Eyes: Negative.  Negative for discharge and redness.  Respiratory: Negative.  Negative for cough and shortness of breath.   Cardiovascular: Negative.  Negative for chest pain and syncope.  Gastrointestinal: Positive for nausea. Negative for vomiting and abdominal pain.  Genitourinary: Negative.  Negative for hematuria.  Musculoskeletal: Negative.  Negative for back pain.  Skin: Negative.  Negative for color change and rash.  Neurological: Positive for dizziness and light-headedness. Negative for speech change, focal weakness, seizures,  loss of consciousness, syncope, headaches and paresthesias.  Hematological: Negative.  Negative for adenopathy.  Psychiatric/Behavioral: Negative.  Negative for memory loss, confusion and altered mental status.  All other systems reviewed and are negative.    Allergies  Review of patient's allergies indicates no known allergies.  Home Medications   Current Outpatient Rx  Name Route Sig Dispense Refill  . AMLODIPINE BESYLATE 10 MG PO TABS  Oral Take 1 tablet (10 mg total) by mouth daily. 90 tablet 3  . ASPIRIN 81 MG PO TBEC Oral Take 1 tablet (81 mg total) by mouth daily. Swallow whole. 30 tablet 12  . CASTOR OIL OIL Oral Take by mouth. AS DIRECTED     . CLONIDINE HCL 0.1 MG PO TABS Oral Take 1 tablet (0.1 mg total) by mouth 2 (two) times daily. 180 tablet 3  . OMEGA-3 FATTY ACIDS 1000 MG PO CAPS Oral Take 2 g by mouth daily.      . IBUPROFEN 200 MG PO TABS Oral Take 200 mg by mouth as needed.      Marland Kitchen LISINOPRIL-HYDROCHLOROTHIAZIDE 20-12.5 MG PO TABS Oral Take 1 tablet by mouth daily. 90 tablet 3  . LOVASTATIN 40 MG PO TABS Oral Take 1 tablet (40 mg total) by mouth at bedtime. 90 tablet 3  . FOLDING WALKER/ADULT MISC Does not apply 1 Device by Does not apply route once. 1 each 0    436  . PEG-KCL-NACL-NASULF-NA ASC-C 100 G PO SOLR Oral Take 1 kit (100 g total) by mouth once. 1 kit 0  . EX-LAX PO Oral Take by mouth. AS DIRECTED PRN       BP 126/64  Pulse 72  Temp(Src) 97.6 F (36.4 C) (Oral)  Resp 20  Ht 5\' 6"  (1.676 m)  Wt 186 lb (84.369 kg)  BMI 30.02 kg/m2  SpO2 98%  Physical Exam  Nursing note and vitals reviewed. Constitutional: He is oriented to person, place, and time. He appears well-developed and well-nourished.  Non-toxic appearance. He does not have a sickly appearance.  HENT:  Head: Normocephalic and atraumatic.  Eyes: Conjunctivae, EOM and lids are normal. Pupils are equal, round, and reactive to light.  Neck: Trachea normal, normal range of motion and full passive range of motion without pain. Neck supple.  Cardiovascular: Normal rate, regular rhythm and normal heart sounds.   Pulmonary/Chest: Effort normal and breath sounds normal. No respiratory distress. He has no wheezes. He has no rales.  Abdominal: Soft. Normal appearance. He exhibits no distension. There is no tenderness. There is no rebound and no CVA tenderness.  Musculoskeletal: Normal range of motion.  Neurological: He is alert and oriented to  person, place, and time. He has normal strength. No cranial nerve deficit. He exhibits normal muscle tone. Coordination normal.       Cranial nerves II through XII intact.  No visual field deficits.  Extraocular eye movements are intact.  Pupils are equal round reactive to light.  Normal finger to nose testing bilaterally.  No pronator drift.  Face is symmetric and tongue is midline.  Speech is clear.  Patient has symmetric strength in his upper extremities and in his legs against resistance.  On gait exam patient is not ataxic but does have smaller steps than is normal for him.  He normally walks without any assistance and did appear somewhat unsteady on his feet but did not become presyncopal here.  Skin: Skin is warm, dry and intact. No rash noted.  Psychiatric: He has a normal  mood and affect. His behavior is normal. Judgment and thought content normal.    ED Course  Procedures (including critical care time)  Results for orders placed during the hospital encounter of 11/19/11  CBC      Component Value Range   WBC 6.5  4.0 - 10.5 (K/uL)   RBC 5.50  4.22 - 5.81 (MIL/uL)   Hemoglobin 11.8 (*) 13.0 - 17.0 (g/dL)   HCT 16.1 (*) 09.6 - 52.0 (%)   MCV 68.2 (*) 78.0 - 100.0 (fL)   MCH 21.5 (*) 26.0 - 34.0 (pg)   MCHC 31.5  30.0 - 36.0 (g/dL)   RDW 04.5  40.9 - 81.1 (%)   Platelets 183  150 - 400 (K/uL)  DIFFERENTIAL      Component Value Range   Neutrophils Relative 71  43 - 77 (%)   Lymphocytes Relative 22  12 - 46 (%)   Monocytes Relative 6  3 - 12 (%)   Eosinophils Relative 1  0 - 5 (%)   Basophils Relative 0  0 - 1 (%)   Neutro Abs 4.6  1.7 - 7.7 (K/uL)   Lymphs Abs 1.4  0.7 - 4.0 (K/uL)   Monocytes Absolute 0.4  0.1 - 1.0 (K/uL)   Eosinophils Absolute 0.1  0.0 - 0.7 (K/uL)   Basophils Absolute 0.0  0.0 - 0.1 (K/uL)   Smear Review PLATELET COUNT CONFIRMED BY SMEAR    BASIC METABOLIC PANEL      Component Value Range   Sodium 138  135 - 145 (mEq/L)   Potassium 4.4  3.5 - 5.1  (mEq/L)   Chloride 100  96 - 112 (mEq/L)   CO2 25  19 - 32 (mEq/L)   Glucose, Bld 119 (*) 70 - 99 (mg/dL)   BUN 30 (*) 6 - 23 (mg/dL)   Creatinine, Ser 9.14 (*) 0.50 - 1.35 (mg/dL)   Calcium 9.1  8.4 - 78.2 (mg/dL)   GFR calc non Af Amer 43 (*) >90 (mL/min)   GFR calc Af Amer 49 (*) >90 (mL/min)  CARDIAC PANEL(CRET KIN+CKTOT+MB+TROPI)      Component Value Range   Total CK 276 (*) 7 - 232 (U/L)   CK, MB 4.2 (*) 0.3 - 4.0 (ng/mL)   Troponin I <0.30  <0.30 (ng/mL)   Relative Index 1.5  0.0 - 2.5   APTT      Component Value Range   aPTT 28  24 - 37 (seconds)  PROTIME-INR      Component Value Range   Prothrombin Time 13.8  11.6 - 15.2 (seconds)   INR 1.04  0.00 - 1.49    Dg Chest 2 View  11/19/2011  *RADIOLOGY REPORT*  Clinical Data: Chest pain.  CHEST - 2 VIEW  Comparison: PA and lateral chest 03/03/2011.  Findings: There is cardiomegaly but no edema.  Lungs are clear.  No pneumothorax or effusion.  IMPRESSION: Cardiomegaly without acute disease.  Original Report Authenticated By: Bernadene Bell. Maricela Curet, M.D.   Ct Head Wo Contrast  11/19/2011  *RADIOLOGY REPORT*  Clinical Data: Dizziness.  Ataxia.  CT HEAD WITHOUT CONTRAST  Technique:  Contiguous axial images were obtained from the base of the skull through the vertex without contrast.  Comparison: Head CT scan 10/02/2011.  Findings: Again seen is chronic microvascular ischemic change and some cortical atrophy.  No evidence of acute abnormality including infarction, hemorrhage, mass lesion, mass effect, midline shift or abnormal extra-axial fluid collection.  Minimal mucosal thickening left maxillary sinus is  noted.  Calvarium is intact.  IMPRESSION: No acute finding.  Stable compared to prior exam.  Original Report Authenticated By: Bernadene Bell. Maricela Curet, M.D.      Date: 11/19/2011  Rate: 80  Rhythm: normal sinus rhythm  QRS Axis: normal  Intervals: normal  ST/T Wave abnormalities: Inverted T waves in inferior and lateral leads   Conduction Disutrbances:none  Narrative Interpretation:   Old EKG Reviewed: unchanged from 03/03/2011    MDM  Patient has a story that is potentially consistent with TIA versus a posterior circulation insufficiency.  Patient also describes presyncope which would be concerning for a possible cardiac cause for his symptoms.  Patient does have known cardiac disease.  I will obtain laboratory studies, chest x-ray and EKG to further evaluate for the presyncope and possible TIA, as well as a CT of his head.  [name], MD 11/19/11 [phone]  Patient with symptoms of presyncope today that I believe warrant an admission for further evaluation with possible echocardiogram and continue cardiac monitoring particularly given the patient's cardiac history.  Secondarily the patient does have dizziness with some gait disturbance today that I believe may need further evaluation with an MRI MRA to assess his posterior circulation more sufficiently then a CT scan can.  I will contact the [facility] service for admission of this patient.  [name], MD 11/19/11 [phone]

## 2011-11-19 NOTE — ED Notes (Signed)
Attempted to call report to [facility], nurse unavailable, was told they'll call back.

## 2011-11-19 NOTE — ED Notes (Signed)
Patient transported to X-ray 

## 2011-11-19 NOTE — Patient Instructions (Addendum)
blood tests are being requested for you today.  You will receive a letter with results. Never drive if you are dizzy. it is critically important to prevent falling down (keep floor areas well-lit, dry, and free of loose objects.  If you have a cane, walker, or wheelchair, you should use it, even for short trips around the house.  Also, try not to rush) Here is a prescription for a walker. If you are too unsteady to walk, go to the emergency room.   i have requested to reschedule your appointment with the neurologist.   you will receive a phone call, about a day and time for an appointment (see letter)

## 2011-11-20 ENCOUNTER — Inpatient Hospital Stay (HOSPITAL_COMMUNITY): Payer: Medicare Other

## 2011-11-20 ENCOUNTER — Ambulatory Visit: Payer: Medicare Other | Admitting: Cardiology

## 2011-11-20 DIAGNOSIS — I359 Nonrheumatic aortic valve disorder, unspecified: Secondary | ICD-10-CM

## 2011-11-20 DIAGNOSIS — R55 Syncope and collapse: Secondary | ICD-10-CM

## 2011-11-20 DIAGNOSIS — R42 Dizziness and giddiness: Secondary | ICD-10-CM

## 2011-11-20 LAB — COMPREHENSIVE METABOLIC PANEL
BUN: 24 mg/dL — ABNORMAL HIGH (ref 6–23)
CO2: 27 mEq/L (ref 19–32)
Calcium: 9 mg/dL (ref 8.4–10.5)
Creatinine, Ser: 1.23 mg/dL (ref 0.50–1.35)
GFR calc Af Amer: 67 mL/min — ABNORMAL LOW (ref >90)
GFR calc non Af Amer: 58 mL/min — ABNORMAL LOW (ref >90)
Glucose, Bld: 88 mg/dL (ref 70–99)
Total Bilirubin: 0.2 mg/dL — ABNORMAL LOW (ref 0.3–1.2)

## 2011-11-20 LAB — IRON AND TIBC
Iron: 44 ug/dL (ref 42–135)
Saturation Ratios: 12 % — ABNORMAL LOW (ref 20–55)
TIBC: 363 ug/dL (ref 215–435)
UIBC: 319 ug/dL (ref 125–400)

## 2011-11-20 LAB — LIPID PANEL
LDL Cholesterol: 150 mg/dL — ABNORMAL HIGH (ref 0–99)
Triglycerides: 74 mg/dL (ref <150)

## 2011-11-20 LAB — CBC
Hemoglobin: 11.4 g/dL — ABNORMAL LOW (ref 13.0–17.0)
Platelets: 182 10*3/uL (ref 150–400)
RBC: 5.42 MIL/uL (ref 4.22–5.81)

## 2011-11-20 MED ORDER — SIMVASTATIN 20 MG PO TABS
20.0000 mg | ORAL_TABLET | Freq: Every day | ORAL | Status: DC
  Administered 2011-11-20 – 2011-11-23 (×4): 20 mg via ORAL
  Filled 2011-11-20 (×5): qty 1

## 2011-11-20 MED ORDER — ASPIRIN EC 325 MG PO TBEC
325.0000 mg | DELAYED_RELEASE_TABLET | Freq: Every day | ORAL | Status: DC
  Administered 2011-11-21 – 2011-11-23 (×3): 325 mg via ORAL
  Filled 2011-11-20 (×4): qty 1

## 2011-11-20 NOTE — Plan of Care (Signed)
Problem: Phase I Progression Outcomes Goal: OOB as tolerated unless otherwise ordered Outcome: Completed/Met Date Met:  11/20/11 Orthostatics during eval: supine-165/73, sitting-185/84, standing-181/88. After ambulation: 194/79.

## 2011-11-20 NOTE — Evaluation (Signed)
Occupational Therapy Evaluation Patient Details Name: DEMARIE HYNEMAN MRN: [id number] DOB: 03/22/1941 Today's Date: 11/20/2011 Time: 5784-6962 OT Time Calculation (min): 33 min  OT Assessment / Plan / Recommendation Clinical Impression  This 71 year old male was admitted with presyncope and MRI reveals CVA.  Pt is overall supervision level for ADLs and min A for tub transfers.  Pt verbalizes that he should sponge bathe unless he has a tub transfer bench.  Pt states that he can stay with a friend.  He doesn't want anyone coming into the house, but recommend a home safety check.  Will follow pt in acute to reinforce safety education.     OT Assessment  Patient needs continued OT Services    Follow Up Recommendations  Other (comment);Supervision/Assistance - 24 hour (home safety check; initial 24/7)    Equipment Recommendations  Other (comment) (tub transfer bench pending home safety check (to make sure it fits)    Frequency Min 2X/week    Precautions / Restrictions Precautions Precautions: Fall Restrictions Weight Bearing Restrictions: No   Pertinent Vitals/Pain 0    ADL  Eating/Feeding: Simulated;Independent Where Assessed - Eating/Feeding: Chair Grooming: Simulated;Set up;Other (comment) (vs supervision to retrieve items for all adls) Where Assessed - Grooming: Supported sitting Upper Body Bathing: Set up;Simulated Where Assessed - Upper Body Bathing: Sitting, chair;Supported Lower Body Bathing: Simulated;Supervision/safety Where Assessed - Lower Body Bathing: Sit to stand from chair Upper Body Dressing: Simulated;Minimal assistance;Other (comment) (due to lines) Where Assessed - Upper Body Dressing: Sitting, chair;Supported Lower Body Dressing: Simulated;Set up Where Assessed - Lower Body Dressing: Sit to stand from chair Toilet Transfer: Performed;Supervision/safety Toilet Transfer Equipment: Comfort height toilet;Other (comment) (without any support to lower  self) Toileting - Clothing Manipulation: Simulated;Supervision/safety Where Assessed - Toileting Clothing Manipulation: Sit to stand from 3-in-1 or toilet Toileting - Hygiene: Simulated;Supervision/safety Where Assessed - Toileting Hygiene: Sit to stand from 3-in-1 or toilet Tub/Shower Transfer: Simulated;Minimal assistance;Other (comment) (unsteady/loss of balance noted) Tub/Shower Transfer Method: Science writer:  (educated on and recommended tub bench) Equipment Used: Rolling walker Ambulation Related to ADLs: supervision level using RW including retrieving items; cues for safety:  keeping walker on floor at all times.    ADL Comments: simulated tub transfer and standing to wash feet:  pt not safe with this; educated to sit to wash feet and sponge bathe unless he gets tub bench; resource list given    OT Goals Acute Rehab OT Goals OT Goal Formulation: With patient ADL Goals Pt Will Transfer to Toilet: with supervision;Ambulation;Regular height toilet (no cues for walker safety) ADL Goal: Toilet Transfer - Progress: Goal set today Pt Will Perform Tub/Shower Transfer: Tub transfer;Transfer tub bench;with supervision ADL Goal: Tub/Shower Transfer - Progress: Goal set today Miscellaneous OT Goals Miscellaneous OT Goal #1: Pt will gather clothes with RW with no more than 1 vc for walker safety OT Goal: Miscellaneous Goal #1 - Progress: Goal set today  Visit Information  Last OT Received On: 11/20/11 Assistance Needed: +1    Subjective Data  Subjective: "I have someone I can stay with" Patient Stated Goal: none stated   Prior Functioning  Home Living Lives With: Alone;Other (Comment) (can stay with a friend) Bathroom Shower/Tub: Engineer, manufacturing systems: Standard Prior Function Level of Independence: Independent Driving: Yes Communication Communication: Other (comment) (very tangential:  much of what he said didn't make sense--mi) Dominant Hand:  Right    Cognition  Overall Cognitive Status: Difficult to assess (wfls for memory, attention...cues for safety)  Difficult to assess due to: Other (comment) (tangential; lacked context--vague) Arousal/Alertness: Awake/alert Orientation Level: Appears intact for tasks assessed Behavior During Session: [facility] for tasks performed    Extremity/Trunk Assessment Right Upper Extremity Assessment RUE ROM/Strength/Tone: Within functional levels (hard end feel right shoulder around 120 degrees) RUE Coordination: WFL - gross/fine motor Left Upper Extremity Assessment LUE ROM/Strength/Tone: Within functional levels LUE Sensation:  (describes altered sensation antecubital area) LUE Coordination: WFL - gross/fine motor Right Lower Extremity Assessment RLE ROM/Strength/Tone: Within functional levels   Mobility Transfers Sit to Stand: 5: Supervision;From chair/3-in-1   Exercise    Balance Balance Balance Assessed: Yes Dynamic Standing Balance Dynamic Standing - Balance Support: Right upper extremity supported Dynamic Standing - Level of Assistance: 4: Min assist;Other (comment) (stepping over simulated tub)  End of Session OT - End of Session Activity Tolerance: Patient tolerated treatment well Patient left: in chair;with call bell/phone within reach;Other (comment) (neurologist came as I was leaving)  Marica Otter, OTR/L [phone] 11/20/2011 Zebadiah Willert 11/20/2011, 4:20 PM

## 2011-11-20 NOTE — Progress Notes (Signed)
Bilateral carotid artery duplex completed.  Preliminary report is no evidence of significant ICA stenosis. 

## 2011-11-20 NOTE — Progress Notes (Signed)
*  PRELIMINARY RESULTS* Echocardiogram 2D Echo has been performed.  Glean Salen [facility] 11/20/2011, 10:33 AM

## 2011-11-20 NOTE — Progress Notes (Signed)
Step #2 Swallow Screen performed. Drop box would not open due to previous answers to Step #1. Passes Water via cup. Passes Water via straw. Passed cracker. Lung sounds clear pre and post Screen with temp remaining 97.5 both times. Passed swallow screen.

## 2011-11-20 NOTE — Progress Notes (Signed)
Subjective:   Chart reviewed. Patient says that he feels better than last night but still feels dizzy and unsteady when he ambulates to the bathroom. She denies slurred speech or facial twisting or asymmetrical limb weakness or tingling or numbness. Denies headache.  Objective  Vital signs in last 24 hours: Filed Vitals:   11/19/11 1929 11/19/11 1930 11/19/11 2216 11/20/11 0500  BP: 146/86 184/90 167/71 138/71  Pulse: 85 85 83 60  Temp:   98.1 F (36.7 C) 97.7 F (36.5 C)  TempSrc:   Oral Oral  Resp:   18 18  Height:   5\' 6"  (1.676 m)   Weight:   83 kg (182 lb 15.7 oz)   SpO2:   96% 95%   Weight change:   Intake/Output Summary (Last 24 hours) at 11/20/11 1359 Last data filed at 11/20/11 0500  Gross per 24 hour  Intake    480 ml  Output    420 ml  Net     60 ml    Physical Exam:  General Exam: Comfortable.  Respiratory System: Clear. No increased work of breathing.  Cardiovascular System: First and second heart sounds heard. Regular rate and rhythm. No JVD/murmurs. Telemetry shows normal sinus rhythm with occasional PACs and PVCs. Gastrointestinal System: Abdomen is non distended, soft and normal bowel sounds heard.  Central Nervous System: Alert and oriented. No focal neurological deficits. No cerebellar signs. Extremities: Symmetrical 5 x 5 power. No pronator drift. Gait not tested.  Labs:  Basic Metabolic Panel:  Lab 11/20/11 5621 11/19/11 1825  NA 138 138  K 3.5 4.4  CL 102 100  CO2 27 25  GLUCOSE 88 119*  BUN 24* 30*  CREATININE 1.23 1.59*  CALCIUM 9.0 9.1  ALB -- --  PHOS -- --   Liver Function Tests:  Lab 11/20/11 0515  AST 19  ALT 16  ALKPHOS 60  BILITOT 0.2*  PROT 7.6  ALBUMIN 3.4*   No results found for this basename: LIPASE:3,AMYLASE:3 in the last 168 hours No results found for this basename: AMMONIA:3 in the last 168 hours CBC:  Lab 11/20/11 0515 11/19/11 1825  WBC 6.2 6.5  NEUTROABS -- 4.6  HGB 11.4* 11.8*  HCT 36.9* 37.5*  MCV  68.1* 68.2*  PLT 182 183   Cardiac Enzymes:  Lab 11/19/11 1825  CKTOTAL 276*  CKMB 4.2*  CKMBINDEX --  TROPONINI <0.30   CBG: No results found for this basename: GLUCAP:5 in the last 168 hours  Iron Studies:   Basename 11/19/11 2305  IRON 44  TIBC 363  TRANSFERRIN --  FERRITIN --   Studies/Results: Dg Chest 2 View  11/19/2011  *RADIOLOGY REPORT*  Clinical Data: Chest pain.  CHEST - 2 VIEW  Comparison: PA and lateral chest 03/03/2011.  Findings: There is cardiomegaly but no edema.  Lungs are clear.  No pneumothorax or effusion.  IMPRESSION: Cardiomegaly without acute disease.  Original Report Authenticated By: Bernadene Bell. Maricela Curet, M.D.   Ct Head Wo Contrast  11/19/2011  *RADIOLOGY REPORT*  Clinical Data: Dizziness.  Ataxia.  CT HEAD WITHOUT CONTRAST  Technique:  Contiguous axial images were obtained from the base of the skull through the vertex without contrast.  Comparison: Head CT scan 10/02/2011.  Findings: Again seen is chronic microvascular ischemic change and some cortical atrophy.  No evidence of acute abnormality including infarction, hemorrhage, mass lesion, mass effect, midline shift or abnormal extra-axial fluid collection.  Minimal mucosal thickening left maxillary sinus is noted.  Calvarium is intact.  IMPRESSION: No acute finding.  Stable compared to prior exam.  Original Report Authenticated By: Bernadene Bell. Maricela Curet, M.D.   Mr [name] Head Wo Contrast  11/20/2011  *RADIOLOGY REPORT*  Clinical Data:   Presyncope.  Atrial fibrillation.  History of hypertension.  MRI HEAD WITHOUT CONTRAST MRA HEAD WITHOUT CONTRAST  Technique:  Multiplanar, multiecho pulse sequences of the brain and surrounding structures were obtained without intravenous contrast. Angiographic images of the head were obtained using MRA technique without contrast.  Comparison:  11/19/2011 CT.  10/02/2011 CT.  MRI HEAD  Findings:  There is a slightly greater than 1 cm sized focus of restricted diffusion in the left  posterior frontoparietal periventricular white matter correlating with a hypodensity on CT performed yesterday (series 3, image 18 - 19).  Given the well- defined cytotoxic edema on CT, this likely represents a late acute or subacute lenticulostriate infarct within the left MCA distribution.  No other acute infarcts are seen.  There is no acute or chronic hemorrhage, mass lesion, hydrocephalus, or extra-axial fluid.  Mild to moderate atrophy is present.  Moderate to extensive chronic microvascular ischemic change affects the periventricular and subcortical white matter elsewhere.  There are no foci of chronic hemorrhage.  Scattered lacunes are noted.  Ischemic demyelination can also be appreciated in the brainstem.  There are no large vessel cortical infarcts.  Midline images demonstrate normal pituitary and cerebellar tonsils. Slight pannus is noted surrounding the odontoid.  There is moderate spondylosis.  The major intracranial vascular structures appear widely patent, described in report below.  There is no significant sinus or mastoid disease.  Orbital structures appear grossly unremarkable.  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  This is new from 10/02/2011, but unchanged from 11/19/2011.  Atrophy with chronic microvascular ischemic change and scattered chronic lacunar infarcts.  MRA HEAD  Findings: In the right carotid, the high cervical, petrous, and cavernous segments are normal.  There are tandem lesions with estimated 50-75% stenosis of the supraclinoid ICA (image 18 of series 400).  Slight inferior outpouching in this vessel likely represents atheromatous change, although a small berry aneurysm in a paraophthalmic location cannot completely be excluded (image 10 of series 403.)  In the left carotid, the high cervical, petrous, and cavernous segments are similarly normal.  Supraclinoid left internal carotid artery  appears widely patent up to the level of the ICA terminus where there is a 50% stenosis.  The basilar artery is diffusely irregular with a mid basilar 75% stenosis just above the anterior inferior cerebellar artery origins.  Both vertebrals are diseased, with a subtotal occlusion of the distal right vertebral and severe 75-90% stenosis of the distal left vertebral; a linear defect in the distal left vertebral just below the P ICA origin could represent a small focal dissection.  There is moderate nonstenotic irregularity of both proximal anterior cerebrals.  The right middle cerebral artery displays mild irregularity with a possible 50% stenosis in its proximal M1 segment.  The left middle cerebral artery is more severely diseased, with tandem 50-75% stenoses in the proximal M1 segment. Both posterior cerebral arteries display moderate to severe irregularity, with a estimated 75-90% stenosis of the ambient segment on the right.  Mild to moderate irregularity of the MCA trifurcation vessels is noted without focal branch occlusion.  No definite cerebellar branch occlusion is seen.  IMPRESSION: Widespread intracranial atherosclerotic changes as described.  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  Bilateral anterior circulation disease, slightly greater on the left.  Slight inferior outpouching right supraclinoid internal carotid artery likely atheromatous; see discussion above.  Original Report Authenticated By: Elsie Stain, M.D.   Mr Brain Wo Contrast  11/20/2011  *RADIOLOGY REPORT*  Clinical Data:   Presyncope.  Atrial fibrillation.  History of hypertension.  MRI HEAD WITHOUT CONTRAST MRA HEAD WITHOUT CONTRAST  Technique:  Multiplanar, multiecho pulse sequences of the brain and surrounding structures were obtained without intravenous contrast. Angiographic  images of the head were obtained using MRA technique without contrast.  Comparison:  11/19/2011 CT.  10/02/2011 CT.  MRI HEAD  Findings:  There is a slightly greater than 1 cm sized focus of restricted diffusion in the left posterior frontoparietal periventricular white matter correlating with a hypodensity on CT performed yesterday (series 3, image 18 - 19).  Given the well- defined cytotoxic edema on CT, this likely represents a late acute or subacute lenticulostriate infarct within the left MCA distribution.  No other acute infarcts are seen.  There is no acute or chronic hemorrhage, mass lesion, hydrocephalus, or extra-axial fluid.  Mild to moderate atrophy is present.  Moderate to extensive chronic microvascular ischemic change affects the periventricular and subcortical white matter elsewhere.  There are no foci of chronic hemorrhage.  Scattered lacunes are noted.  Ischemic demyelination can also be appreciated in the brainstem.  There are no large vessel cortical infarcts.  Midline images demonstrate normal pituitary and cerebellar tonsils. Slight pannus is noted surrounding the odontoid.  There is moderate spondylosis.  The major intracranial vascular structures appear widely patent, described in report below.  There is no significant sinus or mastoid disease.  Orbital structures appear grossly unremarkable.  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  This is new from 10/02/2011, but unchanged from 11/19/2011.  Atrophy with chronic microvascular ischemic change and scattered chronic lacunar infarcts.  MRA HEAD  Findings: In the right carotid, the high cervical, petrous, and cavernous segments are normal.  There are tandem lesions with estimated 50-75% stenosis of the supraclinoid ICA (image 18 of series 400).  Slight inferior outpouching in this vessel likely represents atheromatous change, although a small berry aneurysm in  a paraophthalmic location cannot completely be excluded (image 10 of series 403.)  In the left carotid, the high cervical, petrous, and cavernous segments are similarly normal.  Supraclinoid left internal carotid artery appears widely patent up to the level of the ICA terminus where there is a 50% stenosis.  The basilar artery is diffusely irregular with a mid basilar 75% stenosis just above the anterior inferior cerebellar artery origins.  Both vertebrals are diseased, with a subtotal occlusion of the distal right vertebral and severe 75-90% stenosis of the distal left vertebral; a linear defect in the distal left vertebral just below the P ICA origin could represent a small focal dissection.  There is moderate nonstenotic irregularity of both proximal anterior cerebrals.  The right middle cerebral artery displays mild irregularity with a possible 50% stenosis in its proximal M1 segment.  The left middle cerebral artery is more severely diseased, with tandem 50-75% stenoses in the proximal M1 segment. Both posterior cerebral arteries display moderate to severe irregularity, with a estimated 75-90% stenosis of the ambient segment on the right.  Mild to moderate irregularity of the MCA trifurcation vessels is noted  without focal branch occlusion.  No definite cerebellar branch occlusion is seen.  IMPRESSION: Widespread intracranial atherosclerotic changes as described.  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  Bilateral anterior circulation disease, slightly greater on the left.  Slight inferior outpouching right supraclinoid internal carotid artery likely atheromatous; see discussion above.  Original Report Authenticated By: Elsie Stain, M.D.   Medications:    . DISCONTD: sodium chloride 75 mL/hr at 11/19/11 2315      . aspirin EC  81 mg Oral Daily  .  cloNIDine  0.1 mg Oral BID  . heparin  5,000 Units Subcutaneous Q8H  . meclizine  25 mg Oral Once  . omega-3 acid ethyl esters  2 g Oral Daily  . sodium chloride  3 mL Intravenous Q12H  . sodium chloride  3 mL Intravenous Q12H    I  have reviewed scheduled and prn medications.     Problem/Plan: Principal Problem:  *Pre-syncope Active Problems:  Dehydration  CKD (chronic kidney disease), stage III  HTN (hypertension)  Dyslipidemia  Anemia  PAF (paroxysmal atrial fibrillation)  1. 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 Orthostatic blood pressures are normal. Carotid Doppler showed show no evidence of significant ICA stenosis. MRA of the head showed intracranial atherosclerotic disease. 2-D echo results are pending. Continue aspirin. Will do bedside swallow screen by RN and get PT and OT to evaluate. Neurology consulted for further evaluation and recommendations. Will start statins for elevated LDL. 2. Chronic kidney disease, stage III: Creatinine is stable. 3. Hypertension: Allow for permissive hypertension secondary to recent ischemic stroke. 4. Dyslipidemia: Start simvastatin. 5. Chronic anemia, ? secondary to chronic kidney disease. Stable 6. History of paroxysmal atrial fibrillation: Patient is in sinus rhythm. Patient has not been on anticoagulation before. His CHADS 2 score is 3. Will await neurology recommendations regarding anticoagulation and current risks of bleeding transformation from large infarct. May have to discuss with patient's primary cardiologist in anticoagulation is felt necessary. 7. Coronary artery disease: Asymptomatic. Troponins negative. 8. Presyncope: Possibly secondary to problem #1. 9. Dehydration: Mild and resolved. Discontinue IV fluids.  Discussed with patient's nursing and with neurology.  Dmiyah Liscano 11/20/2011,1:59 PM  LOS: 1 day

## 2011-11-20 NOTE — Evaluation (Addendum)
Physical Therapy Evaluation Patient Details Name: [name] MRN: [id number] DOB: 1940/12/13 Today's Date: 11/20/2011 Time:  1128- 1150    PT Assessment / Plan / Recommendation Clinical Impression  Pt presents with diagnosis of pre-syncope. Pt mobilizing well during eval. Slightly unsteady. Recommended pt have some supervision initially at discharge. Pt stated he has somewhere to go if needed. Orthostatics during eval: supine-165/73, sitting-185/84, standing-181/88. After ambulation: 194/79. Pt will benefit from skilled PT in acute care to maximize independence and safety in preparation for d/c home.     PT Assessment  Patient needs continued PT services    Follow Up Recommendations   (home safety evaluation)    Equipment Recommendations  None recommended by PT    Frequency Min 3X/week    Precautions / Restrictions Precautions Precautions: Fall   Pertinent Vitals/Pain       Mobility  Bed Mobility Bed Mobility: Supine to Sit Supine to Sit: 6: Modified independent (Device/Increase time) Transfers Transfers: Sit to Stand;Stand to Sit Sit to Stand: 5: Supervision;From bed Stand to Sit: To chair/3-in-1;5: Supervision Ambulation/Gait Ambulation/Gait Assistance: 4: Min guard Ambulation Distance (Feet): 300 Feet Assistive device: None Ambulation/Gait Assistance Details: slightly unsteady. pushed iv pole. no LOB during ambulation Gait Pattern: Step-through pattern Stairs: No    Exercises     PT Goals Acute Rehab PT Goals PT Goal Formulation: With patient Time For Goal Achievement: 11/27/11 Potential to Achieve Goals: Good Pt will go Sit to Stand: with modified independence PT Goal: Sit to Stand - Progress: Goal set today Pt will Ambulate: >150 feet;with modified independence PT Goal: Ambulate - Progress: Goal set today  Visit Information  Last PT Received On: 11/20/11 Assistance Needed: +1    Subjective Data  Subjective: "I feel okay" Patient Stated Goal:  Home   Prior Functioning  Home Living Lives With: Alone Available Help at Discharge: Friend(s) Type of Home: House Home Access: Level entry Home Layout: One level Home Adaptive Equipment: None Prior Function Level of Independence: Independent Driving: Yes Communication Communication: No difficulties    Cognition  Overall Cognitive Status: Appears within functional limits for tasks assessed/performed Arousal/Alertness: Awake/alert Orientation Level: Appears intact for tasks assessed Behavior During Session: [facility] for tasks performed    Extremity/Trunk Assessment Right Lower Extremity Assessment RLE ROM/Strength/Tone: Within functional levels RLE Sensation: WFL - Light Touch RLE Coordination: WFL - gross/fine motor Left Lower Extremity Assessment LLE ROM/Strength/Tone: Within functional levels LLE Sensation: WFL - Light Touch LLE Coordination: WFL - gross/fine motor Trunk Assessment Trunk Assessment: Normal   Balance    End of Session PT - End of Session Equipment Utilized During Treatment: Gait belt Activity Tolerance: Patient tolerated treatment well Patient left: in chair;with call bell/phone within reach   Rebeca Alert [facility] 11/20/2011, 12:01 PM [phone]

## 2011-11-20 NOTE — Progress Notes (Signed)
   CARE MANAGEMENT NOTE 11/20/2011  Patient:  [name], [name]   Account Number:  [id number]  Date Initiated:  11/20/2011  Documentation initiated by:  Jiles Crocker  Subjective/Objective Assessment:   ADMITTED TO OBSERVATION FOR DIZZINESS, NAUSEA, PRESYNCOPY EPISODE     Action/Plan:   PCP IS DR Fayrene Fearing JOHN/ [place]; INDEPENDENT PRIOR TO ADMISSION, LIVES ALONE   Anticipated DC Date:  11/21/2011   Anticipated DC Plan:  HOME/SELF CARE         Choice offered to / List presented to:             Status of service:  In process, will continue to follow Medicare Important Message given?   (If response is "NO", the following Medicare IM given date fields will be blank) Date Medicare IM given:   Date Additional Medicare IM given:    Discharge Disposition:    Per UR Regulation:  Reviewed for med. necessity/level of care/duration of stay  If discussed at Long Length of Stay Meetings, dates discussed:    Comments:  11/20/2011- B Breanah Faddis RN, BSN, MHA

## 2011-11-20 NOTE — Consult Note (Signed)
Referring Physician: Hongalgi    Chief Complaint: Vertigo  HPI: [name] is an 71 y.o. male who presented on 11/19/11 with acute onset dizziness.  Does not report any extremity numbness or weakness.  Presented to the ED for evaluation and was admitted.  Work up included an MRI of the brain that shows a small acute/subacute left lenticulostriate infarct.  MRA showed no significant stenosis.  Echo showed no intracardiac masses or thrombi.  Doppler is pending.  Cholesterol and LDL are elevated.  Patient has a history of PAF.  Consult called for further recommendations.    LSN: 11/19/11 tPA Given: No: Outside time window  Past Medical History  Diagnosis Date  . Hypertension     echocardiogram 6/10: Moderate LVH, EF 55-65%, mild, mild MR, MAC, mild LAE, PASP 32  . Dyslipidemia   . History of stroke   . CAD (coronary artery disease)     cath in 1/08: EF 60%, mild dilated Ao root, oD2 30%, LAD 20-30%.  . Paroxysmal atrial fibrillation   . Anemia, iron deficiency 08/15/2011  . History of pneumonia 08/15/2011  . History of MI (myocardial infarction) 08/15/2011  . 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 Cardiac catheterization     Family History  Problem Relation Age of Onset  . Colon cancer Neg Hx   . Aneurysm Sister    Social History:  reports that he has never smoked. He has never used smokeless tobacco. He reports that he does not drink alcohol or use illicit drugs.  Allergies: No Known Allergies  Medications:  I have reviewed the patient's current medications. Prior to Admission:  Prescriptions prior to admission  Medication Sig Dispense Refill  . aspirin 81 MG EC tablet Take 1 tablet (81 mg total) by mouth daily. Swallow whole.  30 tablet  12  . castor oil liquid Take 5 mLs by mouth daily as needed. For constipation.      . cloNIDine (CATAPRES) 0.1 MG tablet Take 1 tablet (0.1 mg total) by mouth 2 (two) times daily.  180 tablet  3  . fish  oil-omega-3 fatty acids 1000 MG capsule Take 2 g by mouth daily.        Marland Kitchen ibuprofen (ADVIL,MOTRIN) 200 MG tablet Take 400 mg by mouth every 8 (eight) hours as needed. For pain.      Marland Kitchen lisinopril-hydrochlorothiazide (ZESTORETIC) 20-12.5 MG per tablet Take 1 tablet by mouth daily.  90 tablet  3  . Misc. Devices (FOLDING WALKER/ADULT) MISC 1 Device by Does not apply route once.  1 each  0   Scheduled:   . aspirin EC  81 mg Oral Daily  . cloNIDine  0.1 mg Oral BID  . heparin  5,000 Units Subcutaneous Q8H  . meclizine  25 mg Oral Once  . omega-3 acid ethyl esters  2 g Oral Daily  . simvastatin  20 mg Oral q1800  . sodium chloride  3 mL Intravenous Q12H  . sodium chloride  3 mL Intravenous Q12H    ROS: History obtained from the patient  General ROS: negative for - chills, fatigue, fever, night sweats, weight gain or weight loss Psychological ROS: negative for - behavioral disorder, hallucinations, memory difficulties, mood swings or suicidal ideation Ophthalmic ROS: negative for - blurry vision, double vision, eye pain or loss of vision ENT ROS: negative for - epistaxis, nasal discharge, oral lesions, sore throat, tinnitus or vertigo Allergy and Immunology ROS: negative for -  hives or itchy/watery eyes Hematological and Lymphatic ROS: negative for - bleeding problems, bruising or swollen lymph nodes Endocrine ROS: negative for - galactorrhea, hair pattern changes, polydipsia/polyuria or temperature intolerance Respiratory ROS: negative for - cough, hemoptysis, shortness of breath or wheezing Cardiovascular ROS: negative for - chest pain, dyspnea on exertion, edema or irregular heartbeat Gastrointestinal ROS: negative for - abdominal pain, diarrhea, hematemesis, nausea/vomiting or stool incontinence Genito-Urinary ROS: negative for - dysuria, hematuria, incontinence or urinary frequency/urgency Musculoskeletal ROS: right leg pain Neurological ROS: as noted in HPI Dermatological ROS:  negative for rash and skin lesion changes  Physical Examination: Blood pressure 136/73, pulse 73, temperature 97.5 F (36.4 C), temperature source Oral, resp. rate 18, height 5\' 6"  (1.676 m), weight 83 kg (182 lb 15.7 oz), SpO2 97.00%.  Neurologic Examination: Mental Status: Alert, oriented, thought content appropriate.  Speech fluent without evidence of aphasia.  Able to follow 3 step commands without difficulty. Cranial Nerves: II: visual fields grossly normal, pupils equal, round, reactive to light and accommodation III,IV, VI: ptosis not present, extra-ocular motions intact bilaterally V,VII: smile symmetric, facial light touch sensation normal bilaterally VIII: hearing normal bilaterally IX,X: gag reflex present XI: trapezius strength/neck flexion strength normal bilaterally XII: tongue strength normal  Motor: Right : Upper extremity   5/5    Left:     Upper extremity   5/5  Lower extremity   5/5     Lower extremity   5/5 Tone and bulk:normal tone throughout; no atrophy noted Sensory: Pinprick and light touch intact throughout, bilaterally Deep Tendon Reflexes: 2+ and symmetric with absent AJ's bilaterally Plantars: Right: equivocal   Left: downgoing Cerebellar: normal finger-to-nose and normal heel-to-shin test   Results for orders placed during the hospital encounter of 11/19/11 (from the past 48 hour(s))  CBC     Status: Abnormal   Collection Time   11/19/11  6:25 PM      Component Value Range Comment   WBC 6.5  4.0 - 10.5 (K/uL)    RBC 5.50  4.22 - 5.81 (MIL/uL)    Hemoglobin 11.8 (*) 13.0 - 17.0 (g/dL)    HCT 16.1 (*) 09.6 - 52.0 (%)    MCV 68.2 (*) 78.0 - 100.0 (fL)    MCH 21.5 (*) 26.0 - 34.0 (pg)    MCHC 31.5  30.0 - 36.0 (g/dL)    RDW 04.5  40.9 - 81.1 (%)    Platelets 183  150 - 400 (K/uL)   DIFFERENTIAL     Status: Normal   Collection Time   11/19/11  6:25 PM      Component Value Range Comment   Neutrophils Relative 71  43 - 77 (%)    Lymphocytes Relative  22  12 - 46 (%)    Monocytes Relative 6  3 - 12 (%)    Eosinophils Relative 1  0 - 5 (%)    Basophils Relative 0  0 - 1 (%)    Neutro Abs 4.6  1.7 - 7.7 (K/uL)    Lymphs Abs 1.4  0.7 - 4.0 (K/uL)    Monocytes Absolute 0.4  0.1 - 1.0 (K/uL)    Eosinophils Absolute 0.1  0.0 - 0.7 (K/uL)    Basophils Absolute 0.0  0.0 - 0.1 (K/uL)    Smear Review PLATELET COUNT CONFIRMED BY SMEAR     BASIC METABOLIC PANEL     Status: Abnormal   Collection Time   11/19/11  6:25 PM      Component  Value Range Comment   Sodium 138  135 - 145 (mEq/L)    Potassium 4.4  3.5 - 5.1 (mEq/L)    Chloride 100  96 - 112 (mEq/L)    CO2 25  19 - 32 (mEq/L)    Glucose, Bld 119 (*) 70 - 99 (mg/dL)    BUN 30 (*) 6 - 23 (mg/dL)    Creatinine, Ser 7.82 (*) 0.50 - 1.35 (mg/dL)    Calcium 9.1  8.4 - 10.5 (mg/dL)    GFR calc non Af Amer 43 (*) >90 (mL/min)    GFR calc Af Amer 49 (*) >90 (mL/min)   CARDIAC PANEL(CRET KIN+CKTOT+MB+TROPI)     Status: Abnormal   Collection Time   11/19/11  6:25 PM      Component Value Range Comment   Total CK 276 (*) 7 - 232 (U/L)    CK, MB 4.2 (*) 0.3 - 4.0 (ng/mL)    Troponin I <0.30  <0.30 (ng/mL)    Relative Index 1.5  0.0 - 2.5    APTT     Status: Normal   Collection Time   11/19/11  6:25 PM      Component Value Range Comment   aPTT 28  24 - 37 (seconds)   PROTIME-INR     Status: Normal   Collection Time   11/19/11  6:25 PM      Component Value Range Comment   Prothrombin Time 13.8  11.6 - 15.2 (seconds)    INR 1.04  0.00 - 1.49    URINALYSIS, ROUTINE W REFLEX MICROSCOPIC     Status: Abnormal   Collection Time   11/19/11 10:05 PM      Component Value Range Comment   Color, Urine YELLOW  YELLOW     APPearance CLOUDY (*) CLEAR     Specific Gravity, Urine 1.017  1.005 - 1.030     pH 6.0  5.0 - 8.0     Glucose, UA NEGATIVE  NEGATIVE (mg/dL)    Hgb urine dipstick NEGATIVE  NEGATIVE     Bilirubin Urine NEGATIVE  NEGATIVE     Ketones, ur NEGATIVE  NEGATIVE (mg/dL)    Protein, ur  NEGATIVE  NEGATIVE (mg/dL)    Urobilinogen, UA 1.0  0.0 - 1.0 (mg/dL)    Nitrite NEGATIVE  NEGATIVE     Leukocytes, UA NEGATIVE  NEGATIVE  MICROSCOPIC NOT DONE ON URINES WITH NEGATIVE PROTEIN, BLOOD, LEUKOCYTES, NITRITE, OR GLUCOSE <1000 mg/dL.  LIPID PANEL     Status: Abnormal   Collection Time   11/19/11 11:05 PM      Component Value Range Comment   Cholesterol 212 (*) 0 - 200 (mg/dL)    Triglycerides 74  <956 (mg/dL)    HDL 47  >21 (mg/dL)    Total CHOL/HDL Ratio 4.5      VLDL 15  0 - 40 (mg/dL)    LDL Cholesterol 308 (*) 0 - 99 (mg/dL)   IRON AND TIBC     Status: Abnormal   Collection Time   11/19/11 11:05 PM      Component Value Range Comment   Iron 44  42 - 135 (ug/dL)    TIBC 657  846 - 962 (ug/dL)    Saturation Ratios 12 (*) 20 - 55 (%)    UIBC 319  125 - 400 (ug/dL)   FOLATE RBC     Status: Normal   Collection Time   11/19/11 11:05 PM      Component Value Range  Comment   RBC Folate 415  >=366 (ng/mL) Reference range not established for pediatric patients.  VITAMIN B12     Status: Normal   Collection Time   11/19/11 11:05 PM      Component Value Range Comment   Vitamin B-12 540  211 - 911 (pg/mL)   TSH     Status: Normal   Collection Time   11/19/11 11:05 PM      Component Value Range Comment   TSH 0.614  0.350 - 4.500 (uIU/mL)   COMPREHENSIVE METABOLIC PANEL     Status: Abnormal   Collection Time   11/20/11  5:15 AM      Component Value Range Comment   Sodium 138  135 - 145 (mEq/L)    Potassium 3.5  3.5 - 5.1 (mEq/L)    Chloride 102  96 - 112 (mEq/L)    CO2 27  19 - 32 (mEq/L)    Glucose, Bld 88  70 - 99 (mg/dL)    BUN 24 (*) 6 - 23 (mg/dL)    Creatinine, Ser 4.09  0.50 - 1.35 (mg/dL)    Calcium 9.0  8.4 - 10.5 (mg/dL)    Total Protein 7.6  6.0 - 8.3 (g/dL)    Albumin 3.4 (*) 3.5 - 5.2 (g/dL)    AST 19  0 - 37 (U/L) SLIGHT HEMOLYSIS   ALT 16  0 - 53 (U/L)    Alkaline Phosphatase 60  39 - 117 (U/L)    Total Bilirubin 0.2 (*) 0.3 - 1.2 (mg/dL)    GFR calc non  Af Amer 58 (*) >90 (mL/min)    GFR calc Af Amer 67 (*) >90 (mL/min)   CBC     Status: Abnormal   Collection Time   11/20/11  5:15 AM      Component Value Range Comment   WBC 6.2  4.0 - 10.5 (K/uL)    RBC 5.42  4.22 - 5.81 (MIL/uL)    Hemoglobin 11.4 (*) 13.0 - 17.0 (g/dL)    HCT 81.1 (*) 91.4 - 52.0 (%)    MCV 68.1 (*) 78.0 - 100.0 (fL)    MCH 21.0 (*) 26.0 - 34.0 (pg)    MCHC 30.9  30.0 - 36.0 (g/dL)    RDW 78.2  95.6 - 21.3 (%)    Platelets 182  150 - 400 (K/uL)    Dg Chest 2 View  11/19/2011  *RADIOLOGY REPORT*  Clinical Data: Chest pain.  CHEST - 2 VIEW  Comparison: PA and lateral chest 03/03/2011.  Findings: There is cardiomegaly but no edema.  Lungs are clear.  No pneumothorax or effusion.  IMPRESSION: Cardiomegaly without acute disease.  Original Report Authenticated By: Bernadene Bell. Maricela Curet, M.D.   Ct Head Wo Contrast  11/19/2011  *RADIOLOGY REPORT*  Clinical Data: Dizziness.  Ataxia.  CT HEAD WITHOUT CONTRAST  Technique:  Contiguous axial images were obtained from the base of the skull through the vertex without contrast.  Comparison: Head CT scan 10/02/2011.  Findings: Again seen is chronic microvascular ischemic change and some cortical atrophy.  No evidence of acute abnormality including infarction, hemorrhage, mass lesion, mass effect, midline shift or abnormal extra-axial fluid collection.  Minimal mucosal thickening left maxillary sinus is noted.  Calvarium is intact.  IMPRESSION: No acute finding.  Stable compared to prior exam.  Original Report Authenticated By: Bernadene Bell. Maricela Curet, M.D.   Mr Maxine Glenn Head Wo Contrast  11/20/2011  *RADIOLOGY REPORT*  Clinical Data:   Presyncope.  Atrial fibrillation.  History of hypertension.  MRI HEAD WITHOUT CONTRAST MRA HEAD WITHOUT CONTRAST  Technique:  Multiplanar, multiecho pulse sequences of the brain and surrounding structures were obtained without intravenous contrast. Angiographic images of the head were obtained using MRA technique without  contrast.  Comparison:  11/19/2011 CT.  10/02/2011 CT.  MRI HEAD  Findings:  There is a slightly greater than 1 cm sized focus of restricted diffusion in the left posterior frontoparietal periventricular white matter correlating with a hypodensity on CT performed yesterday (series 3, image 18 - 19).  Given the well- defined cytotoxic edema on CT, this likely represents a late acute or subacute lenticulostriate infarct within the left MCA distribution.  No other acute infarcts are seen.  There is no acute or chronic hemorrhage, mass lesion, hydrocephalus, or extra-axial fluid.  Mild to moderate atrophy is present.  Moderate to extensive chronic microvascular ischemic change affects the periventricular and subcortical white matter elsewhere.  There are no foci of chronic hemorrhage.  Scattered lacunes are noted.  Ischemic demyelination can also be appreciated in the brainstem.  There are no large vessel cortical infarcts.  Midline images demonstrate normal pituitary and cerebellar tonsils. Slight pannus is noted surrounding the odontoid.  There is moderate spondylosis.  The major intracranial vascular structures appear widely patent, described in report below.  There is no significant sinus or mastoid disease.  Orbital structures appear grossly unremarkable.  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  This is new from 10/02/2011, but unchanged from 11/19/2011.  Atrophy with chronic microvascular ischemic change and scattered chronic lacunar infarcts.  MRA HEAD  Findings: In the right carotid, the high cervical, petrous, and cavernous segments are normal.  There are tandem lesions with estimated 50-75% stenosis of the supraclinoid ICA (image 18 of series 400).  Slight inferior outpouching in this vessel likely represents atheromatous change, although a small berry aneurysm in a paraophthalmic location cannot completely be excluded  (image 10 of series 403.)  In the left carotid, the high cervical, petrous, and cavernous segments are similarly normal.  Supraclinoid left internal carotid artery appears widely patent up to the level of the ICA terminus where there is a 50% stenosis.  The basilar artery is diffusely irregular with a mid basilar 75% stenosis just above the anterior inferior cerebellar artery origins.  Both vertebrals are diseased, with a subtotal occlusion of the distal right vertebral and severe 75-90% stenosis of the distal left vertebral; a linear defect in the distal left vertebral just below the P ICA origin could represent a small focal dissection.  There is moderate nonstenotic irregularity of both proximal anterior cerebrals.  The right middle cerebral artery displays mild irregularity with a possible 50% stenosis in its proximal M1 segment.  The left middle cerebral artery is more severely diseased, with tandem 50-75% stenoses in the proximal M1 segment. Both posterior cerebral arteries display moderate to severe irregularity, with a estimated 75-90% stenosis of the ambient segment on the right.  Mild to moderate irregularity of the MCA trifurcation vessels is noted without focal branch occlusion.  No definite cerebellar branch occlusion is seen.  IMPRESSION: Widespread intracranial atherosclerotic changes as described.  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  Bilateral anterior circulation disease, slightly greater on the left.  Slight inferior outpouching right supraclinoid internal carotid artery likely atheromatous; see discussion above.  Original Report Authenticated By: Elsie Stain, M.D.   Mr Brain Wo Contrast  11/20/2011  *RADIOLOGY REPORT*  Clinical Data:   Presyncope.  Atrial fibrillation.  History of hypertension.  MRI HEAD WITHOUT CONTRAST MRA HEAD  WITHOUT CONTRAST  Technique:  Multiplanar, multiecho pulse sequences of the brain and surrounding structures were obtained without intravenous contrast. Angiographic images of the head were obtained using MRA technique without contrast.  Comparison:  11/19/2011 CT.  10/02/2011 CT.  MRI HEAD  Findings:  There is a slightly greater than 1 cm sized focus of restricted diffusion in the left posterior frontoparietal periventricular white matter correlating with a hypodensity on CT performed yesterday (series 3, image 18 - 19).  Given the well- defined cytotoxic edema on CT, this likely represents a late acute or subacute lenticulostriate infarct within the left MCA distribution.  No other acute infarcts are seen.  There is no acute or chronic hemorrhage, mass lesion, hydrocephalus, or extra-axial fluid.  Mild to moderate atrophy is present.  Moderate to extensive chronic microvascular ischemic change affects the periventricular and subcortical white matter elsewhere.  There are no foci of chronic hemorrhage.  Scattered lacunes are noted.  Ischemic demyelination can also be appreciated in the brainstem.  There are no large vessel cortical infarcts.  Midline images demonstrate normal pituitary and cerebellar tonsils. Slight pannus is noted surrounding the odontoid.  There is moderate spondylosis.  The major intracranial vascular structures appear widely patent, described in report below.  There is no significant sinus or mastoid disease.  Orbital structures appear grossly unremarkable.  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  This is new from 10/02/2011, but unchanged from 11/19/2011.  Atrophy with chronic microvascular ischemic change and scattered chronic lacunar infarcts.  MRA HEAD  Findings: In the right carotid, the high cervical, petrous, and cavernous segments are normal.  There are tandem lesions with estimated 50-75% stenosis  of the supraclinoid ICA (image 18 of series 400).  Slight inferior outpouching in this vessel likely represents atheromatous change, although a small berry aneurysm in a paraophthalmic location cannot completely be excluded (image 10 of series 403.)  In the left carotid, the high cervical, petrous, and cavernous segments are similarly normal.  Supraclinoid left internal carotid artery appears widely patent up to the level of the ICA terminus where there is a 50% stenosis.  The basilar artery is diffusely irregular with a mid basilar 75% stenosis just above the anterior inferior cerebellar artery origins.  Both vertebrals are diseased, with a subtotal occlusion of the distal right vertebral and severe 75-90% stenosis of the distal left vertebral; a linear defect in the distal left vertebral just below the P ICA origin could represent a small focal dissection.  There is moderate nonstenotic irregularity of both proximal anterior cerebrals.  The right middle cerebral artery displays mild irregularity with a possible 50% stenosis in its proximal M1 segment.  The left middle cerebral artery is more severely diseased, with tandem 50-75% stenoses in the proximal M1 segment. Both posterior cerebral arteries display moderate to severe irregularity, with a estimated 75-90% stenosis of the ambient segment on the right.  Mild to moderate irregularity of the MCA trifurcation vessels is noted without focal branch occlusion.  No definite cerebellar branch occlusion is seen.  IMPRESSION: Widespread intracranial atherosclerotic changes as described.  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  Bilateral anterior circulation disease, slightly greater on the left.  Slight inferior outpouching right supraclinoid internal carotid artery likely atheromatous; see discussion above.   Original Report Authenticated By: Elsie Stain, M.D.    Assessment: 71 y.o. male presenting with complaints of dizziness found to have an acute/subacute infarct on work up.  History of atrial fibrillation.  Therapy has been started.  Stroke Risk Factors - atrial fibrillation, hyperlipidemia and hypertension  Plan: 1. With history of atrial fibrillation would start the patient on Coumadin as long as determined safe with PT evaluation.  Would use ASA 325mg  as a bridge and would not bolus the patient with heparin.   2. Will follow up results of CD 3. Patient also complaining of right leg numbness/pain that has been present for the past 1-2 months.  History difficult to pin down.  Possibility of a meralgia paresthetica but patient should have a MRI of  The lumbar spine.  May be performed as an outpatient.     Thana Farr, MD [facility] [phone] 11/20/2011, 4:29 PM

## 2011-11-21 LAB — URINE CULTURE
Colony Count: NO GROWTH
Culture  Setup Time: 201304190636
Culture: NO GROWTH

## 2011-11-21 NOTE — Progress Notes (Signed)
Occupational Therapy Treatment Patient Details Name: [name] MRN: [id number] DOB: 01-05-1941 Today's Date: 11/21/2011 Time: 1000-1025 OT Time Calculation (min): 25 min  OT Assessment / Plan / Recommendation Comments on Treatment Session      Follow Up Recommendations  Supervision/Assistance - 24 hour;Other (comment) (home safety check)    Equipment Recommendations  Tub/shower bench;Other (comment) (after home safety check. Pt not sure if bench will fit in bathroom. States intially it will, then states he is not sure.)    Frequency Min 2X/week   Plan Discharge plan remains appropriate    Precautions / Restrictions Precautions Precautions: Fall   Pertinent Vitals/Pain No complaint of pain    ADL  Tub/Shower Transfer: Simulated;Supervision/safety Tub/Shower Transfer Method: Other (comment) (sit and pivot on tubbench) Psychologist, educational: Counsellor Used: Rolling walker ADL Comments: Practiced tub transfer with tub bench using RW to back up to seat and sit/pivot legs over. pt did well with this. Initially pt states the tubbench WILL fit in bathroom but later in conversation he states the toilet may be too close. Pt with resource list per OT note from yesterday in case he wants to purchase one after home assessment. Agree that a safety eval would be appropriate and helpful. Pt should sponge bathe until he gets a bench, if he decides to obtain. MD asked about pt balance for being on anticoagulants at home. Informed MD that I would let PT know  pt would benefit from a PT session to reassess balance. Reported to MD that pt did not have any LOB during tub bench transfer with OT. Pt states yesterday per evals that he does have help at discharge but today he states he is not sure if he does.     OT Goals ADL Goals ADL Goal: Tub/Shower Transfer - Progress: Met  Visit Information  Last OT Received On: 11/21/11 Assistance Needed: +1    Subjective Data  Subjective: yes, I may need that tub bench Patient Stated Goal: none stated specifically   Prior Functioning       Cognition  Overall Cognitive Status: Difficult to assess (difficult to get specific information from pt. Tangential) Difficult to assess due to: Other (comment) (tangential. not able to be specific  with info) Arousal/Alertness: Awake/alert Orientation Level: Appears intact for tasks assessed Behavior During Session: [facility] for tasks performed    Mobility Bed Mobility Bed Mobility: Supine to Sit Supine to Sit: 6: Modified independent (Device/Increase time) Transfers Transfers: Sit to Stand;Stand to Sit Sit to Stand: 6: Modified independent (Device/Increase time);From bed;Other (comment) (tubbench) Stand to Sit: 6: Modified independent (Device/Increase time);To bed;Other (comment) [name])   Exercises    Balance    End of Session OT - End of Session Equipment Utilized During Treatment: Other (comment) [name]) Activity Tolerance: Patient tolerated treatment well Patient left: in bed;with call bell/phone within reach;with bed alarm set   Lennox Laity [phone] 11/21/2011, 12:55 PM

## 2011-11-21 NOTE — Progress Notes (Addendum)
Physical Therapy Treatment Patient Details Name: [name] MRN: [id number] DOB: 10-21-1940 Today's Date: 11/21/2011 Time: 4696-2952 PT Time Calculation (min): 24 min  PT Assessment / Plan / Recommendation Comments on Treatment Session  Performed Berg balance assessment. Pt scored 44/56 indicating significant fall risk (>80%).  Pt had the most difficulty with bending down to reach an object off of the floor. Functionally, balance appears to be good while walking.  He was able to turn in a 360 degree circle, walk while turning head, and reach forward without LOB.  If pt avoids bending down to floor his fall risk would be much lower.  Pt stated he always holds onto something (furniture)  if he needs to reach to the floor.      Follow Up Recommendations  Home health PT (home safety eval)    Equipment Recommendations  None recommended by PT    Frequency Min 3X/week   Plan Discharge plan remains appropriate    Precautions / Restrictions Precautions Precautions: Fall   Pertinent Vitals/Pain **pt denies pain*    Mobility  Bed Mobility Bed Mobility: Supine to Sit Supine to Sit: 6: Modified independent (Device/Increase time);With rails;HOB flat Transfers Transfers: Sit to Stand;Stand to Sit Sit to Stand: 7: Independent;Without upper extremity assist Stand to Sit: 7: Independent;Without upper extremity assist Ambulation/Gait Ambulation/Gait Assistance: 7: Independent Ambulation Distance (Feet): 240 Feet Assistive device: None Ambulation/Gait Assistance Details: no LOB; pt able to turn head left to right while walking without LOB Gait Pattern: Within Functional Limits Gait velocity: WNL General Gait Details: no deviations noted Stairs: No Wheelchair Mobility Wheelchair Mobility: No    Exercises     PT Goals Acute Rehab PT Goals PT Goal Formulation: With patient Time For Goal Achievement: 11/27/11 Potential to Achieve Goals: Good Pt will go Sit to Stand: with modified  independence PT Goal: Sit to Stand - Progress: Met Pt will Ambulate: >150 feet;Independently PT Goal: Ambulate - Progress: Met Additional Goals Additional Goal #1: pt will demonstrate improved balance with a Berg balance score of 48 or greater  Visit Information  Last PT Received On: 11/21/11 Assistance Needed: +1    Subjective Data  Subjective: I feel pretty good. I'm not dizzy anymore.   Patient Stated Goal: DC home, do more exercise, cut back on drinking soda   Cognition  Overall Cognitive Status: Difficult to assess (difficult to get specific information from pt. Tangential) Difficult to assess due to: Other (comment) (tangential. not able to be specific  with info) Arousal/Alertness: Awake/alert Orientation Level: Appears intact for tasks assessed Behavior During Session: [facility] for tasks performed    Balance  Standardized Balance Assessment Standardized Balance Assessment: Berg Balance Test Berg Balance Test Sit to Stand: Able to stand without using hands and stabilize independently Standing Unsupported: Able to stand safely 2 minutes Sitting with Back Unsupported but Feet Supported on Floor or Stool: Able to sit safely and securely 2 minutes Stand to Sit: Sits safely with minimal use of hands Transfers: Able to transfer safely, minor use of hands Standing Unsupported with Eyes Closed: Able to stand 10 seconds with supervision Standing Ubsupported with Feet Together: Able to place feet together independently but unable to hold for 30 seconds From Standing, Reach Forward with Outstretched Arm: Can reach forward >12 cm safely (5") From Standing Position, Pick up Object from Floor: Unable to try/needs assist to keep balance From Standing Position, Turn to Look Behind Over each Shoulder: Looks behind from both sides and weight shifts well Turn 360  Degrees: Able to turn 360 degrees safely in 4 seconds or less Standing Unsupported, Alternately Place Feet on Step/Stool: Able to stand  independently and safely and complete 8 steps in 20 seconds Standing Unsupported, One Foot in Front: Able to take small step independently and hold 30 seconds Standing on One Leg: Able to lift leg independently and hold equal to or more than 3 seconds Total Score: 44   End of Session PT - End of Session Equipment Utilized During Treatment: Gait belt Activity Tolerance: Patient tolerated treatment well Patient left: in bed Nurse Communication: Mobility status    Ralene Bathe Kistler 11/21/2011, 3:25 PM [phone]

## 2011-11-21 NOTE — Progress Notes (Signed)
Subjective:   Patient says is feeling much better with improved dizziness. Only slightly dizzy with ambulation. Also complains of chronic intermittent right leg numbness laterally.  Objective  Vital signs in last 24 hours: Filed Vitals:   11/20/11 1433 11/20/11 1630 11/20/11 2112 11/21/11 0539  BP: 136/73  159/74 134/51  Pulse: 73  75 63  Temp: 97.5 F (36.4 C) 97.5 F (36.4 C) 98.5 F (36.9 C) 97.5 F (36.4 C)  TempSrc: Oral  Oral Oral  Resp: 18  20 18   Height:      Weight:      SpO2: 97%  95% 97%   Weight change:   Intake/Output Summary (Last 24 hours) at 11/21/11 1345 Last data filed at 11/21/11 0900  Gross per 24 hour  Intake   1080 ml  Output   2190 ml  Net  -1110 ml    Physical Exam:  General Exam: Comfortable.  Respiratory System: Clear. No increased work of breathing.  Cardiovascular System: First and second heart sounds heard. Regular rate and rhythm. No JVD/murmurs. Telemetry shows sinus bradycardia in the high 50s to sinus rhythm in the 70s. Single episode of 4 beat NSVT versus PVCs. Gastrointestinal System: Abdomen is non distended, soft and normal bowel sounds heard.  Central Nervous System: Alert and oriented. No focal neurological deficits. No cerebellar signs. Extremities: Symmetrical 5 x 5 power. No pronator drift.   Labs:  Basic Metabolic Panel:  Lab 11/20/11 1610 11/19/11 1825  NA 138 138  K 3.5 4.4  CL 102 100  CO2 27 25  GLUCOSE 88 119*  BUN 24* 30*  CREATININE 1.23 1.59*  CALCIUM 9.0 9.1  ALB -- --  PHOS -- --   Liver Function Tests:  Lab 11/20/11 0515  AST 19  ALT 16  ALKPHOS 60  BILITOT 0.2*  PROT 7.6  ALBUMIN 3.4*   No results found for this basename: LIPASE:3,AMYLASE:3 in the last 168 hours No results found for this basename: AMMONIA:3 in the last 168 hours CBC:  Lab 11/20/11 0515 11/19/11 1825  WBC 6.2 6.5  NEUTROABS -- 4.6  HGB 11.4* 11.8*  HCT 36.9* 37.5*  MCV 68.1* 68.2*  PLT 182 183   Cardiac Enzymes:  Lab  11/19/11 1825  CKTOTAL 276*  CKMB 4.2*  CKMBINDEX --  TROPONINI <0.30   CBG: No results found for this basename: GLUCAP:5 in the last 168 hours  Iron Studies:   Basename 11/19/11 2305  IRON 44  TIBC 363  TRANSFERRIN --  FERRITIN --   Studies/Results: Dg Chest 2 View  11/19/2011  *RADIOLOGY REPORT*  Clinical Data: Chest pain.  CHEST - 2 VIEW  Comparison: PA and lateral chest 03/03/2011.  Findings: There is cardiomegaly but no edema.  Lungs are clear.  No pneumothorax or effusion.  IMPRESSION: Cardiomegaly without acute disease.  Original Report Authenticated By: Bernadene Bell. Maricela Curet, M.D.   Ct Head Wo Contrast  11/19/2011  *RADIOLOGY REPORT*  Clinical Data: Dizziness.  Ataxia.  CT HEAD WITHOUT CONTRAST  Technique:  Contiguous axial images were obtained from the base of the skull through the vertex without contrast.  Comparison: Head CT scan 10/02/2011.  Findings: Again seen is chronic microvascular ischemic change and some cortical atrophy.  No evidence of acute abnormality including infarction, hemorrhage, mass lesion, mass effect, midline shift or abnormal extra-axial fluid collection.  Minimal mucosal thickening left maxillary sinus is noted.  Calvarium is intact.  IMPRESSION: No acute finding.  Stable compared to prior exam.  Original Report  Authenticated By: Bernadene Bell. Maricela Curet, M.D.   Mr Maxine Glenn Head Wo Contrast  11/20/2011  *RADIOLOGY REPORT*  Clinical Data:   Presyncope.  Atrial fibrillation.  History of hypertension.  MRI HEAD WITHOUT CONTRAST MRA HEAD WITHOUT CONTRAST  Technique:  Multiplanar, multiecho pulse sequences of the brain and surrounding structures were obtained without intravenous contrast. Angiographic images of the head were obtained using MRA technique without contrast.  Comparison:  11/19/2011 CT.  10/02/2011 CT.  MRI HEAD  Findings:  There is a slightly greater than 1 cm sized focus of restricted diffusion in the left posterior frontoparietal periventricular white matter  correlating with a hypodensity on CT performed yesterday (series 3, image 18 - 19).  Given the well- defined cytotoxic edema on CT, this likely represents a late acute or subacute lenticulostriate infarct within the left MCA distribution.  No other acute infarcts are seen.  There is no acute or chronic hemorrhage, mass lesion, hydrocephalus, or extra-axial fluid.  Mild to moderate atrophy is present.  Moderate to extensive chronic microvascular ischemic change affects the periventricular and subcortical white matter elsewhere.  There are no foci of chronic hemorrhage.  Scattered lacunes are noted.  Ischemic demyelination can also be appreciated in the brainstem.  There are no large vessel cortical infarcts.  Midline images demonstrate normal pituitary and cerebellar tonsils. Slight pannus is noted surrounding the odontoid.  There is moderate spondylosis.  The major intracranial vascular structures appear widely patent, described in report below.  There is no significant sinus or mastoid disease.  Orbital structures appear grossly unremarkable.  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  This is new from 10/02/2011, but unchanged from 11/19/2011.  Atrophy with chronic microvascular ischemic change and scattered chronic lacunar infarcts.  MRA HEAD  Findings: In the right carotid, the high cervical, petrous, and cavernous segments are normal.  There are tandem lesions with estimated 50-75% stenosis of the supraclinoid ICA (image 18 of series 400).  Slight inferior outpouching in this vessel likely represents atheromatous change, although a small berry aneurysm in a paraophthalmic location cannot completely be excluded (image 10 of series 403.)  In the left carotid, the high cervical, petrous, and cavernous segments are similarly normal.  Supraclinoid left internal carotid artery appears widely patent up to the level of the ICA terminus  where there is a 50% stenosis.  The basilar artery is diffusely irregular with a mid basilar 75% stenosis just above the anterior inferior cerebellar artery origins.  Both vertebrals are diseased, with a subtotal occlusion of the distal right vertebral and severe 75-90% stenosis of the distal left vertebral; a linear defect in the distal left vertebral just below the P ICA origin could represent a small focal dissection.  There is moderate nonstenotic irregularity of both proximal anterior cerebrals.  The right middle cerebral artery displays mild irregularity with a possible 50% stenosis in its proximal M1 segment.  The left middle cerebral artery is more severely diseased, with tandem 50-75% stenoses in the proximal M1 segment. Both posterior cerebral arteries display moderate to severe irregularity, with a estimated 75-90% stenosis of the ambient segment on the right.  Mild to moderate irregularity of the MCA trifurcation vessels is noted without focal branch occlusion.  No definite cerebellar branch occlusion is seen.  IMPRESSION: Widespread intracranial atherosclerotic changes as described.  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  Bilateral anterior circulation disease, slightly greater on the left.  Slight inferior outpouching right supraclinoid internal carotid artery likely atheromatous; see discussion above.  Original Report Authenticated By: Elsie Stain, M.D.   Mr Brain Wo Contrast  11/20/2011  *RADIOLOGY REPORT*  Clinical Data:   Presyncope.  Atrial fibrillation.  History of hypertension.  MRI HEAD WITHOUT CONTRAST MRA HEAD WITHOUT CONTRAST  Technique:  Multiplanar, multiecho pulse sequences of the brain and surrounding structures were obtained without intravenous contrast. Angiographic images of the head were obtained using MRA technique without  contrast.  Comparison:  11/19/2011 CT.  10/02/2011 CT.  MRI HEAD  Findings:  There is a slightly greater than 1 cm sized focus of restricted diffusion in the left posterior frontoparietal periventricular white matter correlating with a hypodensity on CT performed yesterday (series 3, image 18 - 19).  Given the well- defined cytotoxic edema on CT, this likely represents a late acute or subacute lenticulostriate infarct within the left MCA distribution.  No other acute infarcts are seen.  There is no acute or chronic hemorrhage, mass lesion, hydrocephalus, or extra-axial fluid.  Mild to moderate atrophy is present.  Moderate to extensive chronic microvascular ischemic change affects the periventricular and subcortical white matter elsewhere.  There are no foci of chronic hemorrhage.  Scattered lacunes are noted.  Ischemic demyelination can also be appreciated in the brainstem.  There are no large vessel cortical infarcts.  Midline images demonstrate normal pituitary and cerebellar tonsils. Slight pannus is noted surrounding the odontoid.  There is moderate spondylosis.  The major intracranial vascular structures appear widely patent, described in report below.  There is no significant sinus or mastoid disease.  Orbital structures appear grossly unremarkable.  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  This is new from 10/02/2011, but unchanged from 11/19/2011.  Atrophy with chronic microvascular ischemic change and scattered chronic lacunar infarcts.  MRA HEAD  Findings: In the right carotid, the high cervical, petrous, and cavernous segments are normal.  There are tandem lesions with estimated 50-75% stenosis of the supraclinoid ICA (image 18 of series 400).  Slight inferior outpouching in this vessel likely represents atheromatous change, although a small berry aneurysm in a paraophthalmic location cannot completely be excluded  (image 10 of series 403.)  In the left carotid, the high cervical, petrous, and cavernous segments are similarly normal.  Supraclinoid left internal carotid artery appears widely patent up to the level of the ICA terminus where there is a 50% stenosis.  The basilar artery is diffusely irregular with a mid basilar 75% stenosis just above the anterior inferior cerebellar artery origins.  Both vertebrals are diseased, with a subtotal occlusion of the distal right vertebral and severe 75-90% stenosis of the distal left vertebral; a linear defect in the distal left vertebral just below the P ICA origin could represent a small focal dissection.  There is moderate nonstenotic irregularity of both proximal anterior cerebrals.  The right middle cerebral artery displays mild irregularity with a possible 50% stenosis in its proximal M1 segment.  The left middle cerebral artery is more severely diseased, with tandem 50-75% stenoses in the proximal M1 segment. Both posterior cerebral arteries display moderate to severe irregularity, with a estimated 75-90% stenosis of the ambient segment on the right.  Mild to moderate irregularity of the MCA trifurcation vessels is noted without focal branch occlusion.  No definite cerebellar branch occlusion is seen.  IMPRESSION: Widespread intracranial atherosclerotic changes as described.  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  Bilateral anterior circulation disease, slightly greater on the left.  Slight inferior outpouching right supraclinoid internal carotid artery likely atheromatous; see discussion above.  Original Report Authenticated By: Elsie Stain, M.D.   Medications:      . aspirin EC  325 mg Oral Daily  . cloNIDine  0.1 mg Oral BID  . heparin  5,000 Units Subcutaneous Q8H  . omega-3 acid ethyl esters  2 g Oral Daily   . simvastatin  20 mg Oral q1800  . sodium chloride  3 mL Intravenous Q12H  . sodium chloride  3 mL Intravenous Q12H  . DISCONTD: aspirin EC  81 mg Oral Daily    I  have reviewed scheduled and prn medications.     Problem/Plan: Principal Problem:  *Pre-syncope Active Problems:  Dehydration  CKD (chronic kidney disease), stage III  HTN (hypertension)  Dyslipidemia  Anemia  PAF (paroxysmal atrial fibrillation)  1. 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 Orthostatic blood pressures are normal. Carotid Doppler showed show no evidence of significant ICA stenosis. MRA of the head showed intracranial atherosclerotic disease. 2-D echo shows mild LVH and LVEF of 55-60%. Simvastatin started. On full dose aspirin. Neurology consultation appreciated. They recommend Coumadin anticoagulation as long as it's safe with PT evaluation. Discussed with OT and PT will plan to followup between today and tomorrow to make further recommendations. Discussed with Dr. Cassell Clement, cardiology who reviewed patient's office chart. It's unclear as to why he was not on Coumadin before for paroxysmal A. fib. It is possible that he was not started on Coumadin secondary to noncompliance issues. Dr. Patty Sermons recommended initiating Coumadin given his increased risk of recurrent stroke, if patient can ensure compliance with medications and M.D. followups. Discussed with patient at length and he verbalizes that he will comply with medications, blood draws and M.D. followups. Will await PT recommendations prior to starting Coumadin. 2. Chronic kidney disease, stage III: Creatinine is stable. 3. Hypertension: Reasonable control. 4. Dyslipidemia: Started simvastatin. 5. Chronic anemia, ? secondary to chronic kidney disease. Stable 6. History of paroxysmal atrial fibrillation: Patient is in sinus rhythm. Patient has not been on anticoagulation  before. His CHADS 2 score is 3. See discussion in problem #1. 7. Coronary artery disease: Asymptomatic. Troponins negative. 8. Presyncope: Possibly secondary to problem #1. 9. Dehydration: Mild and resolved. Discontinue IV fluids.  Discussed with patient's nursing.  Girtie Wiersma 11/21/2011,1:45 PM  LOS: 2 days

## 2011-11-22 MED ORDER — LISINOPRIL 20 MG PO TABS
20.0000 mg | ORAL_TABLET | Freq: Every day | ORAL | Status: DC
  Administered 2011-11-22 – 2011-11-23 (×2): 20 mg via ORAL
  Filled 2011-11-22 (×4): qty 1

## 2011-11-22 MED ORDER — HYDROCHLOROTHIAZIDE 12.5 MG PO CAPS
12.5000 mg | ORAL_CAPSULE | Freq: Every day | ORAL | Status: DC
  Administered 2011-11-22 – 2011-11-23 (×2): 12.5 mg via ORAL
  Filled 2011-11-22 (×4): qty 1

## 2011-11-22 MED ORDER — LISINOPRIL-HYDROCHLOROTHIAZIDE 20-12.5 MG PO TABS: 1.0000 | ORAL_TABLET | Freq: Every day | ORAL | Status: DC

## 2011-11-22 NOTE — Progress Notes (Addendum)
Subjective:   Mild intermittent dizziness.   Objective  Vital signs in last 24 hours: Filed Vitals:   11/21/11 0539 11/21/11 1300 11/21/11 2126 11/22/11 0526  BP: 134/51 177/82 150/83 137/70  Pulse: 63 77 61 59  Temp: 97.5 F (36.4 C) 97.6 F (36.4 C) 97.7 F (36.5 C) 97 F (36.1 C)  TempSrc: Oral Oral Oral Oral  Resp: 18 18 18 18   Height:      Weight:      SpO2: 97% 100% 95% 95%   Weight change:   Intake/Output Summary (Last 24 hours) at 11/22/11 1338 Last data filed at 11/22/11 0526  Gross per 24 hour  Intake    360 ml  Output    450 ml  Net    -90 ml    Physical Exam:  General Exam: Comfortable.  Respiratory System: Clear. No increased work of breathing.  Cardiovascular System: First and second heart sounds heard. Regular rate and rhythm. No JVD/murmurs. Telemetry shows mostly sinus bradycardia in the high 50s to sinus rhythm in the 70s. 1 short run of nonsustained narrow complex tachycardia-sinus tachycardia versus A. fib. No further NSVT. Gastrointestinal System: Abdomen is non distended, soft and normal bowel sounds heard.  Central Nervous System: Alert and oriented. No focal neurological deficits. No cerebellar signs. Extremities: Symmetrical 5 x 5 power. No pronator drift.   Labs:  Basic Metabolic Panel:  Lab 11/20/11 6962 11/19/11 1825  NA 138 138  K 3.5 4.4  CL 102 100  CO2 27 25  GLUCOSE 88 119*  BUN 24* 30*  CREATININE 1.23 1.59*  CALCIUM 9.0 9.1  ALB -- --  PHOS -- --   Liver Function Tests:  Lab 11/20/11 0515  AST 19  ALT 16  ALKPHOS 60  BILITOT 0.2*  PROT 7.6  ALBUMIN 3.4*   No results found for this basename: LIPASE:3,AMYLASE:3 in the last 168 hours No results found for this basename: AMMONIA:3 in the last 168 hours CBC:  Lab 11/20/11 0515 11/19/11 1825  WBC 6.2 6.5  NEUTROABS -- 4.6  HGB 11.4* 11.8*  HCT 36.9* 37.5*  MCV 68.1* 68.2*  PLT 182 183   Cardiac Enzymes:  Lab 11/19/11 1825  CKTOTAL 276*  CKMB 4.2*  CKMBINDEX  --  TROPONINI <0.30   CBG: No results found for this basename: GLUCAP:5 in the last 168 hours  Iron Studies:   Basename 11/19/11 2305  IRON 44  TIBC 363  TRANSFERRIN --  FERRITIN --   Studies/Results: No results found. Medications:      . aspirin EC  325 mg Oral Daily  . cloNIDine  0.1 mg Oral BID  . heparin  5,000 Units Subcutaneous Q8H  . omega-3 acid ethyl esters  2 g Oral Daily  . simvastatin  20 mg Oral q1800  . sodium chloride  3 mL Intravenous Q12H  . sodium chloride  3 mL Intravenous Q12H    I  have reviewed scheduled and prn medications.     Problem/Plan: Principal Problem:  *Pre-syncope Active Problems:  Dehydration  CKD (chronic kidney disease), stage III  HTN (hypertension)  Dyslipidemia  Anemia  PAF (paroxysmal atrial fibrillation)  1. 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 Orthostatic blood pressures were normal. Carotid Doppler showed show no evidence of significant ICA stenosis. MRA of the head showed intracranial atherosclerotic disease. 2-D echo shows mild LVH and LVEF of 55-60%. Simvastatin started. On full dose aspirin. Given  history of paroxysmal atrial fibrillation and recurrent strokes, ideally patient should be on anticoagulation. However patient has history of noncompliance. On talking to him in the last couple of days still do not get the impression that he will be consistently compliant with Coumadin. PT evaluated him and indicates that he has a significant fall risk especially on bending. Patient also indicates that he lives alone and has no family or friends who can be with him 24/7. Will discuss options with patient's primary cardiologist tomorrow regarding benefits of stroke prevention by initiating Coumadin versus risk of falls and noncompliance. Once this decision is made, he could be discharged tomorrow. 2. Chronic kidney disease, stage III: Creatinine is  stable. 3. Hypertension: Uncontrolled. Continue clonidine. Add back home dose of HCTZ-lisinopril. Monitor he 4. Dyslipidemia: Started simvastatin. 5. Chronic anemia, ? secondary to chronic kidney disease. Stable 6. History of paroxysmal atrial fibrillation: Patient is in sinus rhythm. Patient has not been on anticoagulation before. His CHADS 2 score is 3. See discussion in problem #1. 7. Coronary artery disease: Asymptomatic. Troponins negative. 8. Presyncope: Possibly secondary to problem #1. 9. Dehydration: Mild and resolved. Discontinue IV fluids.  Disposition: Possible discharge on 4/22 after clarifying decision regarding anticoagulation.  [name] 11/22/2011,1:38 PM  LOS: 3 days

## 2011-11-23 ENCOUNTER — Telehealth: Payer: Self-pay | Admitting: Internal Medicine

## 2011-11-23 DIAGNOSIS — I639 Cerebral infarction, unspecified: Secondary | ICD-10-CM | POA: Diagnosis present

## 2011-11-23 MED ORDER — SIMVASTATIN 20 MG PO TABS: 20.0000 mg | ORAL_TABLET | Freq: Every day | ORAL | Status: DC

## 2011-11-23 MED ORDER — CLOPIDOGREL BISULFATE 75 MG PO TABS: 75.0000 mg | ORAL_TABLET | Freq: Every day | ORAL | Status: DC

## 2011-11-23 NOTE — Discharge Summary (Signed)
Discharge Summary  [name] MR#: [id number]  DOB:1940-09-03  Date of Admission: 11/19/2011 Date of Discharge: 11/23/2011  Patient's PCP: Oliver Barre, MD, MD  Attending Physician:Maston Wight  Consults:  Neurology: Dr. Thana Farr.  Discharge Diagnoses: Principal Problem:  *CVA (cerebrovascular accident) Active Problems:  Pre-syncope  Dehydration  CKD (chronic kidney disease), stage III  HTN (hypertension)  Dyslipidemia  Anemia  PAF (paroxysmal atrial fibrillation)   Brief Admitting History and Physical 71 year old male, with known history of CAD, s/p NSTEMI 08/2006, cardiac catheterization showed non-obstructive disease, previous TIA, s/p right thalamic CVA 12/2008, HTN, dyslipidemia, PAF, not on anticoagulation, chronic left maxillary sinus disease, mild chronic iron deficiency anemia, Cyst excision from lower back about 5 years ago, CKD 3 (baseline creatinine 1.6 on 05/06/11), presented with dizziness, lightheadedness and feeling like he was about to faint. He was admitted for further evaluation and management.    Discharge Medications Current Discharge Medication List    START taking these medications   Details  clopidogrel (PLAVIX) 75 MG tablet Take 1 tablet (75 mg total) by mouth daily. Qty: 30 tablet, Refills: 0    simvastatin (ZOCOR) 20 MG tablet Take 1 tablet (20 mg total) by mouth daily at 6 PM. Qty: 30 tablet, Refills: 0      CONTINUE these medications which have NOT CHANGED   Details  aspirin 81 MG EC tablet Take 1 tablet (81 mg total) by mouth daily. Swallow whole. Qty: 30 tablet, Refills: 12    castor oil liquid Take 5 mLs by mouth daily as needed. For constipation.    cloNIDine (CATAPRES) 0.1 MG tablet Take 1 tablet (0.1 mg total) by mouth 2 (two) times daily. Qty: 180 tablet, Refills: 3    fish oil-omega-3 fatty acids 1000 MG capsule Take 2 g by mouth daily.      lisinopril-hydrochlorothiazide (ZESTORETIC) 20-12.5 MG per tablet Take 1  tablet by mouth daily. Qty: 90 tablet, Refills: 3    Misc. Devices (FOLDING WALKER/ADULT) MISC 1 Device by Does not apply route once. Qty: 1 each, Refills: 0   Comments: 436      STOP taking these medications     ibuprofen (ADVIL,MOTRIN) 200 MG tablet Comments:  Reason for Stopping:          Hospital Course: CVA (cerebrovascular accident) Present on Admission:  .Pre-syncope .Dehydration .CKD (chronic kidney disease), stage III .HTN (hypertension) .Dyslipidemia .Anemia .CVA (cerebrovascular accident)   1. 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 Orthostatic blood pressures were normal. Carotid Doppler showed show no evidence of significant ICA stenosis. MRA of the head showed intracranial atherosclerotic disease. 2-D echo shows mild LVH and LVEF of 55-60%. Simvastatin started. Given patient's history of paroxysmal atrial fibrillation and prior and current history of stroke, ideally patient should be on anticoagulation. This decision was carefully considered and discussed with consulting neurologist, patient's primary cardiologist and primary care physician. Due to history of noncompliance with M.D. followups/meds, significant risk of falls and microcytic anemia, it was felt that risks of anticoagulation and bleeding outweighed the benefits of stroke prevention. Thereby patient was placed on aspirin 81 mg and Plavix 75 mg daily per neurologist recommendation. His dizziness was much improved. Patient declined home health PT for safety evaluation.  2. Chronic kidney disease, stage III: Creatinine is stable. 3. Hypertension: Uncontrolled. Continue clonidine and HCTZ-lisinopril.  4. Dyslipidemia: Started simvastatin. 5. Chronic anemia/likely iron deficiency and CKD.: Being evaluated by gastroenterology as  outpatient. Patient missed appointment with the gastroenterologist this month. 6. History of paroxysmal  atrial fibrillation: Patient is in sinus rhythm. His CHADS 2 score is 3. See discussion in problem #1. 7. Coronary artery disease: Asymptomatic. Troponins negative. 8. Presyncope: Possibly secondary to problem #1. 9. Dehydration: Mild and resolved.   Day of Discharge BP 145/77  Pulse 65  Temp(Src) 97.7 F (36.5 C) (Oral)  Resp 18  Ht 5\' 6"  (1.676 m)  Wt 83 kg (182 lb 15.7 oz)  BMI 29.53 kg/m2  SpO2 98%  General Exam: Comfortable.  Respiratory System: Clear. No increased work of breathing.  Cardiovascular System: First and second heart sounds heard. Regular rate and rhythm. No JVD/murmurs. Gastrointestinal System: Abdomen is non distended, soft and normal bowel sounds heard.  Central Nervous System: Alert and oriented. No focal neurological deficits. No cerebellar signs.  Extremities: Symmetrical 5 x 5 power. No pronator drift.  Basic Metabolic Panel:  Lab 11/20/11 1610 11/19/11 1825  NA 138 138  K 3.5 4.4  CL 102 100  CO2 27 25  GLUCOSE 88 119*  BUN 24* 30*  CREATININE 1.23 1.59*  CALCIUM 9.0 9.1  ALB -- --  PHOS -- --   Liver Function Tests:  Lab 11/20/11 0515  AST 19  ALT 16  ALKPHOS 60  BILITOT 0.2*  PROT 7.6  ALBUMIN 3.4*   No results found for this basename: LIPASE:3,AMYLASE:3 in the last 168 hours No results found for this basename: AMMONIA:3 in the last 168 hours CBC:  Lab 11/20/11 0515 11/19/11 1825  WBC 6.2 6.5  NEUTROABS -- 4.6  HGB 11.4* 11.8*  HCT 36.9* 37.5*  MCV 68.1* 68.2*  PLT 182 183   Cardiac Enzymes:  Lab 11/19/11 1825  CKTOTAL 276*  CKMB 4.2*  CKMBINDEX --  TROPONINI <0.30   CBG: No results found for this basename: GLUCAP:5 in the last 168 hours  Lab data:  1. Lipid panel: Cholesterol 212, triglycerides 74, HDL 47, LDL 150, VLDL 15. 2. Anemia panel: Iron 44, total iron binding capacity 363, RBC folate 415, vitamin B 540. 3. INR 1.04. 4. TSH 0.614. 5. UA: Negative for features of UTI. 6. Urine culture: Negative   Dg  Chest 2 View  11/19/2011  *RADIOLOGY REPORT*  Clinical Data: Chest pain.  CHEST - 2 VIEW  Comparison: PA and lateral chest 03/03/2011.  Findings: There is cardiomegaly but no edema.  Lungs are clear.  No pneumothorax or effusion.  IMPRESSION: Cardiomegaly without acute disease.  Original Report Authenticated By: Bernadene Bell. Maricela Curet, M.D.   Ct Head Wo Contrast  11/19/2011  *RADIOLOGY REPORT*  Clinical Data: Dizziness.  Ataxia.  CT HEAD WITHOUT CONTRAST  Technique:  Contiguous axial images were obtained from the base of the skull through the vertex without contrast.  Comparison: Head CT scan 10/02/2011.  Findings: Again seen is chronic microvascular ischemic change and some cortical atrophy.  No evidence of acute abnormality including infarction, hemorrhage, mass lesion, mass effect, midline shift or abnormal extra-axial fluid collection.  Minimal mucosal thickening left maxillary sinus is noted.  Calvarium is intact.  IMPRESSION: No acute finding.  Stable compared to prior exam.  Original Report Authenticated By: Bernadene Bell. Maricela Curet, M.D.   Mr Maxine Glenn Head Wo Contrast  11/20/2011  *RADIOLOGY REPORT*  Clinical Data:   Presyncope.  Atrial fibrillation.  History of hypertension.  MRI HEAD WITHOUT CONTRAST MRA HEAD WITHOUT CONTRAST  Technique:  Multiplanar, multiecho pulse sequences of the brain and surrounding structures were obtained without intravenous  contrast. Angiographic images of the head were obtained using MRA technique without contrast.  Comparison:  11/19/2011 CT.  10/02/2011 CT.  MRI HEAD  Findings:  There is a slightly greater than 1 cm sized focus of restricted diffusion in the left posterior frontoparietal periventricular white matter correlating with a hypodensity on CT performed yesterday (series 3, image 18 - 19).  Given the well- defined cytotoxic edema on CT, this likely represents a late acute or subacute lenticulostriate infarct within the left MCA distribution.  No other acute infarcts are  seen.  There is no acute or chronic hemorrhage, mass lesion, hydrocephalus, or extra-axial fluid.  Mild to moderate atrophy is present.  Moderate to extensive chronic microvascular ischemic change affects the periventricular and subcortical white matter elsewhere.  There are no foci of chronic hemorrhage.  Scattered lacunes are noted.  Ischemic demyelination can also be appreciated in the brainstem.  There are no large vessel cortical infarcts.  Midline images demonstrate normal pituitary and cerebellar tonsils. Slight pannus is noted surrounding the odontoid.  There is moderate spondylosis.  The major intracranial vascular structures appear widely patent, described in report below.  There is no significant sinus or mastoid disease.  Orbital structures appear grossly unremarkable.  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  This is new from 10/02/2011, but unchanged from 11/19/2011.  Atrophy with chronic microvascular ischemic change and scattered chronic lacunar infarcts.  MRA HEAD  Findings: In the right carotid, the high cervical, petrous, and cavernous segments are normal.  There are tandem lesions with estimated 50-75% stenosis of the supraclinoid ICA (image 18 of series 400).  Slight inferior outpouching in this vessel likely represents atheromatous change, although a small berry aneurysm in a paraophthalmic location cannot completely be excluded (image 10 of series 403.)  In the left carotid, the high cervical, petrous, and cavernous segments are similarly normal.  Supraclinoid left internal carotid artery appears widely patent up to the level of the ICA terminus where there is a 50% stenosis.  The basilar artery is diffusely irregular with a mid basilar 75% stenosis just above the anterior inferior cerebellar artery origins.  Both vertebrals are diseased, with a subtotal occlusion of the distal right vertebral and severe  75-90% stenosis of the distal left vertebral; a linear defect in the distal left vertebral just below the P ICA origin could represent a small focal dissection.  There is moderate nonstenotic irregularity of both proximal anterior cerebrals.  The right middle cerebral artery displays mild irregularity with a possible 50% stenosis in its proximal M1 segment.  The left middle cerebral artery is more severely diseased, with tandem 50-75% stenoses in the proximal M1 segment. Both posterior cerebral arteries display moderate to severe irregularity, with a estimated 75-90% stenosis of the ambient segment on the right.  Mild to moderate irregularity of the MCA trifurcation vessels is noted without focal branch occlusion.  No definite cerebellar branch occlusion is seen.  IMPRESSION: Widespread intracranial atherosclerotic changes as described.  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  Bilateral anterior circulation disease, slightly greater on the left.  Slight inferior outpouching right supraclinoid internal carotid artery likely atheromatous; see discussion above.  Original Report Authenticated By: Elsie Stain, M.D.     Disposition: Discharged home in stable condition.  Diet: Heart Healthy.  Activity: Increase activity gradually.  Follow-up Appts: Discharge Orders    Future Appointments: Provider: Department: Dept Phone: Center:   11/26/2011 2:00 PM [facility] R[facility] [phone] [facility]   11/27/2011 3:30 PM Milas Gain, MD [facility] [place] [phone] None   11/30/2011 2:45 PM Corwin Levins, MD [facility] [phone] [facility]   12/01/2011 3:15 PM Herby Abraham, MD [facility] [phone] [facility]   12/10/2011 9:30 AM [name] Dare, MD,FACG [facility] [phone] [facility]     Future Orders Please  Complete By Expires   Diet - low sodium heart healthy      Increase activity slowly      Call MD for:  persistant dizziness or light-headedness         TESTS THAT NEED FOLLOW-UP None.  Time spent on discharge, talking to the patient, and coordinating care: 45 mins.   SignedMarcellus Scott, MD 11/23/2011, 7:11 PM

## 2011-11-23 NOTE — Progress Notes (Signed)
Physical Therapy Treatment Patient Details Name: [name] MRN: [id number] DOB: Dec 18, 1940 Today's Date: 11/23/2011 Time: 1010-1100 PT Time Calculation (min): 50 min  PT Assessment / Plan / Recommendation Comments on Treatment Session  Patient able to participate in functional activities at sink and balance activities without loss of balance.  Might benefit from walker for home just for safety.  BP little higher with activity as expected (158/87)  but no complaints of dizziness or balance losses with treatment.    Follow Up Recommendations  Home health PT (home safety eval)    Equipment Recommendations  None recommended by PT    Frequency Min 3X/week   Plan Discharge plan remains appropriate    Precautions / Restrictions Precautions Precautions: Fall   Pertinent Vitals/Pain No complaints of pain with treatment    Mobility  Bed Mobility Supine to Sit: 7: Independent Transfers Sit to Stand: 7: Independent Stand to Sit: 7: Independent Ambulation/Gait Assistive device: Rolling walker;None Ambulation/Gait Assistance Details: used RW to walk initial 400', then without walker for balance activities and walking in room, standing at sink Gait Pattern: Decreased stride length    Exercises     PT Goals Acute Rehab PT Goals Pt will go Sit to Stand: with modified independence PT Goal: Sit to Stand - Progress: Met Pt will Ambulate: >150 feet;Independently PT Goal: Ambulate - Progress: Progressing toward goal Additional Goals Additional Goal #1: pt will demonstrate improved balance with a Berg balance score of 48 or greater PT Goal: Additional Goal #1 - Progress: Progressing toward goal  Visit Information  Last PT Received On: 11/23/11    Subjective Data  Subjective: Think might get to go home today.  Okay as long as I don't reach down.   Cognition  Overall Cognitive Status: Appears within functional limits for tasks assessed/performed Arousal/Alertness:  Awake/alert Orientation Level: Appears intact for tasks assessed Behavior During Session: [facility] for tasks performed    Balance  Dynamic Standing Balance Dynamic Standing - Balance Support: No upper extremity supported;Left upper extremity supported;During functional activity Dynamic Standing - Level of Assistance: 5: Stand by assistance Dynamic Standing - Comments: standing to brush teeth and wash face, stand by for safety High Level Balance High Level Balance Activites: Head turns;Sudden stops;Turns;Backward walking High Level Balance Comments: initially performed with wall rail for support, then with min assist and no support  End of Session PT - End of Session Equipment Utilized During Treatment: Gait belt Activity Tolerance: Patient tolerated treatment well Patient left: in chair;with call bell/phone within reach    [facility] 11/23/2011, 12:42 PM

## 2011-11-23 NOTE — Telephone Encounter (Signed)
Called per hospitalist;  Pt with recent CVA, dizziness in the setting of PAF and ideally would need coumadin, but also high risk fall - I agreed with holding coumadin, start ASA/Plavix and I will see in the office in next wk;  Pt also noted for microcytic anemia, has GI appt May 9 with Dr Russella Dar, may need colonscopy

## 2011-11-23 NOTE — Progress Notes (Signed)
Talked to patient about DCP; patient lives alone / offered [facility] - patient stated that he does not want anyone to go to his home. He plans to have a close friend be with him to "make sure that he does the right things". Independent prior to admission, states that he has good family and friends to help him Offered HHC again, patient refused; B Lobbyist, BSN, [place]

## 2011-11-26 ENCOUNTER — Telehealth: Payer: Self-pay | Admitting: *Deleted

## 2011-11-26 ENCOUNTER — Telehealth: Payer: Self-pay

## 2011-11-26 ENCOUNTER — Ambulatory Visit (AMBULATORY_SURGERY_CENTER): Payer: Self-pay | Admitting: *Deleted

## 2011-11-26 VITALS — Ht 66.5 in | Wt 175.0 lb

## 2011-11-26 DIAGNOSIS — Z1211 Encounter for screening for malignant neoplasm of colon: Secondary | ICD-10-CM

## 2011-11-26 DIAGNOSIS — D509 Iron deficiency anemia, unspecified: Secondary | ICD-10-CM

## 2011-11-26 MED ORDER — PEG-KCL-NACL-NASULF-NA ASC-C 100 G PO SOLR: ORAL | Status: DC

## 2011-11-26 NOTE — Telephone Encounter (Signed)
[name] has a Rx to start Plavix but hasn't filled it yet.  If and when he should start this we need an order for him to stop it prior to his colon scheduled for 12/10/11.  He has Paroxysmal a fib and had a recent CVA.  His gait is unsteady since the CVA but he's alert and oriented x3. He may need to be cleared by a cardiologist to even have the procedure since this stroke occurred since he's been here to see [name]

## 2011-11-26 NOTE — Telephone Encounter (Signed)
No answer at Mr McQeen's home number.  [name]

## 2011-11-26 NOTE — Telephone Encounter (Signed)
The patient was admitted to the hospital with a stroke.  The documentation for the plavix start is in a note with the primary care physician.  It appears that this was done because the patient is not a good candidate for warfarin.  Dr. Jonny Ruiz is seeing the patient back in follow up per the note.  This was not cardiology prescribed--the use of clopidogrel given the hematologic issue will need careful consideration.    The optimal care of this patient will require direct telephone conversation between the gastroenterologist and primary care provider as the patient also has a microcytic anemia, and decisions regarding endoscopy, timing, drug administration all need to be carefully coordinated.      Review of this chart required approximately 35 minutes--including both the hospitalists initial call and the note from the GI CMA.  Note to be forwarded to Dr. Jonny Ruiz and Dr. Russella Dar.   [name] 5:30 PM 11/26/2011

## 2011-11-26 NOTE — Telephone Encounter (Signed)
Unable to reach pt by phone.

## 2011-11-26 NOTE — Telephone Encounter (Signed)
[name] in [place] and patient will come in to speak with Dr. Russella Dar since patient has had a CVA since last office visit in January. Patient has not had a f/u visit with Dr. Jonny Ruiz and Dr. Riley Kill since CVA.  Will go ahead and send letter to Cardiologist asking if patient can stop Plavix prior to his procedure before appt with Dr. Russella Dar.

## 2011-11-26 NOTE — Telephone Encounter (Signed)
Will need to send the letter regarding holding Plavix to Dr Jonny Ruiz

## 2011-11-26 NOTE — Telephone Encounter (Signed)
  11/26/2011    RE: [name] DOB: [birth year] MRN: [id number]   Dear Dr. Riley Kill,    We have scheduled the above patient for an endoscopic procedure. Our records show that he is on anticoagulation therapy.   Please advise as to how long the patient may come off his therapy of Plavix prior to the procedure, which is to be arranged.  Please fax back/ or route the completed form to [place] at [phone].   Sincerely,  Christie Nottingham, CMA   Please respond by 12/02/11 and route back to Christie Nottingham, CMA

## 2011-11-27 ENCOUNTER — Ambulatory Visit: Payer: Medicare Other | Admitting: Neurology

## 2011-11-27 ENCOUNTER — Telehealth: Payer: Self-pay | Admitting: *Deleted

## 2011-11-27 NOTE — Telephone Encounter (Signed)
Marchelle Folks made aware of my attempt to contact patient.  Message left on Friends phone to have pt call our schedulers and make office visit and that I was cancelling his colon for 12/10/11 until he was cleared medically to have colon.

## 2011-11-27 NOTE — Telephone Encounter (Signed)
No answer at home number.

## 2011-11-27 NOTE — Telephone Encounter (Signed)
Dr. Russella Dar could you review Dr. Rosalyn Charters note re: [name] and advise if I should contact him to cancel his colon and make an office visit to see you.  I've been unsuccessful so far in reaching him by phone.  Wyona Almas

## 2011-11-27 NOTE — Telephone Encounter (Signed)
Please coordinate with Marchelle Folks as I sent her the note yesterday. Thanks.

## 2011-11-27 NOTE — Telephone Encounter (Signed)
No answer on pts home phone.  Left message on his friend Lawernce Pitts' phone to call and schedule an office visit with Dr. Russella Dar and that we would cancel his colon procedure on 12/10/11 until he's been seen by Dr. Russella Dar

## 2011-11-30 ENCOUNTER — Ambulatory Visit (INDEPENDENT_AMBULATORY_CARE_PROVIDER_SITE_OTHER): Payer: Medicare Other | Admitting: Internal Medicine

## 2011-11-30 ENCOUNTER — Ambulatory Visit: Payer: Medicare Other | Admitting: Internal Medicine

## 2011-11-30 ENCOUNTER — Encounter: Payer: Self-pay | Admitting: Internal Medicine

## 2011-11-30 VITALS — BP 122/72 | HR 78 | Temp 98.3°F | Ht 66.0 in | Wt 179.0 lb

## 2011-11-30 DIAGNOSIS — I639 Cerebral infarction, unspecified: Secondary | ICD-10-CM

## 2011-11-30 DIAGNOSIS — I10 Essential (primary) hypertension: Secondary | ICD-10-CM

## 2011-11-30 DIAGNOSIS — I6350 Cerebral infarction due to unspecified occlusion or stenosis of unspecified cerebral artery: Secondary | ICD-10-CM

## 2011-11-30 DIAGNOSIS — D649 Anemia, unspecified: Secondary | ICD-10-CM

## 2011-11-30 MED ORDER — CLONIDINE HCL 0.1 MG PO TABS: 0.1000 mg | ORAL_TABLET | Freq: Two times a day (BID) | ORAL | Status: DC

## 2011-11-30 MED ORDER — SIMVASTATIN 20 MG PO TABS: 20.0000 mg | ORAL_TABLET | Freq: Every day | ORAL | Status: DC

## 2011-11-30 MED ORDER — LISINOPRIL-HYDROCHLOROTHIAZIDE 20-12.5 MG PO TABS: 1.0000 | ORAL_TABLET | Freq: Every day | ORAL | Status: DC

## 2011-11-30 NOTE — Assessment & Plan Note (Signed)
stable overall by hx and exam, and pt to continue medical treatment as before 

## 2011-11-30 NOTE — Assessment & Plan Note (Signed)
Microcytic, for GI evaluation as planned, to d/c plavix, cont asa but hold 5 days prior to GI procedure

## 2011-11-30 NOTE — Telephone Encounter (Signed)
plavix stopped  To hold asa for 5 days prior to colonoscopy may 13

## 2011-11-30 NOTE — Progress Notes (Signed)
Subjective:    Patient ID: [name], male    DOB: 12-06-1940, 71 y.o.   MRN: [id number]  HPI  Here to f/u, overall doing ok but very confused about his meds, not clear what meds he is actually taking, has several bottles of meds he no longer is supposed to take but is wondering about today.  No overt bleeding or bruising.  Pt denies chest pain, increased sob or doe, wheezing, orthopnea, PND, increased LE swelling, palpitations, dizziness or syncope.  Pt denies new neurological symptoms such as new headache, or facial or extremity weakness or numbness   Pt denies polydipsia, polyuria.   Pt denies fever, wt loss, night sweats, loss of appetite, or other constitutional symptoms  He does have colonoscopy scheduled may 13.  Also confused about his written instructions for the movieprep Past Medical History  Diagnosis Date  . Hypertension     echocardiogram 6/10: Moderate LVH, EF 55-65%, mild, mild MR, MAC, mild LAE, PASP 32  . Dyslipidemia   . History of stroke   . CAD (coronary artery disease)     cath in 1/08: EF 60%, mild dilated Ao root, oD2 30%, LAD 20-30%.  . Paroxysmal atrial fibrillation   . Anemia, iron deficiency 08/15/2011  . History of pneumonia 08/15/2011  . History of MI (myocardial infarction) 08/15/2011  . 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 Stroke 10/2011  . Myocardial infarction   . Hyperlipidemia    Past Surgical History  Procedure Date  . Cardiac catheterization     reports that he has never smoked. He has never used smokeless tobacco. He reports that he does not drink alcohol or use illicit drugs. family history includes Aneurysm in his sister.  There is no history of Colon cancer. No Known Allergies Current Outpatient Prescriptions on File Prior to Visit  Medication Sig Dispense Refill  . aspirin 81 MG EC tablet Take 1 tablet (81 mg total) by mouth daily. Swallow whole.  30 tablet  12  . castor oil liquid Take 5 mLs by mouth daily as needed. For constipation.      .  fish oil-omega-3 fatty acids 1000 MG capsule Take 2 g by mouth daily.        . peg 3350 powder (MOVIPREP) SOLR MOVI PREP take as directed  1 kit  0  . DISCONTD: cloNIDine (CATAPRES) 0.1 MG tablet Take 1 tablet (0.1 mg total) by mouth 2 (two) times daily.  180 tablet  3  . DISCONTD: lisinopril-hydrochlorothiazide (ZESTORETIC) 20-12.5 MG per tablet Take 1 tablet by mouth daily.  90 tablet  3  . DISCONTD: simvastatin (ZOCOR) 20 MG tablet Take 1 tablet (20 mg total) by mouth daily at 6 PM.  30 tablet  0   Review of Systems Review of Systems  Constitutional: Negative for diaphoresis and unexpected weight change.    Eyes: Negative for photophobia and visual disturbance.  Respiratory: Negative for choking and stridor.   Gastrointestinal: Negative for vomiting and blood in stool.  Genitourinary: Negative for hematuria and decreased urine volume.  Musculoskeletal: Negative for gait problem.  Skin: Negative for color change and wound.  Neurological: Negative for tremors and numbness. .       Objective:   Physical Exam BP 122/72  Pulse 78  Temp(Src) 98.3 F (36.8 C) (Oral)  Ht 5\' 6"  (1.676 m)  Wt 179 lb (81.194 kg)  BMI 28.89 kg/m2  SpO2 98% Physical Exam  VS noted, not ill appearing Constitutional: Pt appears well-developed and  well-nourished.  HENT: Head: Normocephalic.  Right Ear: External ear normal.  Left Ear: External ear normal.  Eyes: Conjunctivae and EOM are normal. Pupils are equal, round, and reactive to light.  Neck: Normal range of motion. Neck supple.  Cardiovascular: Normal rate and regular rhythm.   Pulmonary/Chest: Effort normal and breath sounds normal.  Abd:  Soft, NT, non-distended, + BS Neurological: Pt is alert. No cranial nerve deficit.  Skin: Skin is warm. No erythema.  Psychiatric: Pt behavior is normal. Thought content normal.     Assessment & Plan:

## 2011-11-30 NOTE — Patient Instructions (Addendum)
OK to stop the plavix completely now Continue all other medications as before, BUT stop the Aspirin 5 days before the colon test, then start again the day after the test if there is no obvious bleeding Remember, only take the medications on your list today (ok to throw away old medicine, such as lovastatin you may have more of at home) Please go to third floor for more explanation of the Movieprep instructions Please return in 3 months

## 2011-11-30 NOTE — Telephone Encounter (Signed)
I was able to talk with [name] and she said she will have [name] call [phone] tomorrow and will tell him he needs to schedule and office visit with Dr. Russella Dar.   I explained that we cancelled his colon procedure until he sees Dr. Russella Dar.

## 2011-11-30 NOTE — Telephone Encounter (Signed)
No answer on pt. Home number  Wyona Almas

## 2011-11-30 NOTE — Assessment & Plan Note (Signed)
stable overall by hx and exam, most recent data reviewed with pt, and pt to continue medical treatment as before  Lab Results  Component Value Date   WBC 6.2 11/20/2011   HGB 11.4* 11/20/2011   HCT 36.9* 11/20/2011   PLT 182 11/20/2011   GLUCOSE 88 11/20/2011   CHOL 212* 11/19/2011   TRIG 74 11/19/2011   HDL 47 11/19/2011   LDLDIRECT 200.5 04/01/2011   LDLCALC 150* 11/19/2011   ALT 16 11/20/2011   AST 19 11/20/2011   NA 138 11/20/2011   K 3.5 11/20/2011   CL 102 11/20/2011   CREATININE 1.23 11/20/2011   BUN 24* 11/20/2011   CO2 27 11/20/2011   TSH 0.614 11/19/2011   INR 1.04 11/19/2011

## 2011-12-01 ENCOUNTER — Encounter: Payer: Self-pay | Admitting: Cardiology

## 2011-12-01 ENCOUNTER — Ambulatory Visit (INDEPENDENT_AMBULATORY_CARE_PROVIDER_SITE_OTHER): Payer: Self-pay | Admitting: Cardiology

## 2011-12-01 VITALS — BP 140/80 | HR 71 | Resp 18 | Ht 65.0 in | Wt 180.0 lb

## 2011-12-01 DIAGNOSIS — I639 Cerebral infarction, unspecified: Secondary | ICD-10-CM

## 2011-12-01 DIAGNOSIS — I4891 Unspecified atrial fibrillation: Secondary | ICD-10-CM

## 2011-12-01 DIAGNOSIS — I2510 Atherosclerotic heart disease of native coronary artery without angina pectoris: Secondary | ICD-10-CM

## 2011-12-01 DIAGNOSIS — I480 Paroxysmal atrial fibrillation: Secondary | ICD-10-CM

## 2011-12-01 DIAGNOSIS — D649 Anemia, unspecified: Secondary | ICD-10-CM

## 2011-12-01 DIAGNOSIS — I252 Old myocardial infarction: Secondary | ICD-10-CM

## 2011-12-01 DIAGNOSIS — I6350 Cerebral infarction due to unspecified occlusion or stenosis of unspecified cerebral artery: Secondary | ICD-10-CM

## 2011-12-01 NOTE — Patient Instructions (Signed)
Your physician has requested that you have an abdominal aorta duplex. During this test, an ultrasound is used to evaluate the aorta. Allow 30 minutes for this exam. Do not eat after midnight the day before and avoid carbonated beverages  Your physician recommends that you schedule a follow-up appointment in: 2 MONTHS with Dr Riley Kill  Your physician has recommended that you wear a 24 hour holter monitor. Holter monitors are medical devices that record the heart's electrical activity. Doctors most often use these monitors to diagnose arrhythmias. Arrhythmias are problems with the speed or rhythm of the heartbeat. The monitor is a small, portable device. You can wear one while you do your normal daily activities. This is usually used to diagnose what is causing palpitations/syncope (passing out).  Your physician recommends that you continue on your current medications as directed. Please refer to the Current Medication list given to you today.

## 2011-12-01 NOTE — Telephone Encounter (Signed)
Attempted to contact patient to let him know of Dr. Raphael Gibney response to Plavix clearance. Colonoscopy scheduled back on 12/10/11 at 9:30am since we have received a answer before appt.Will keep trying to call patient to let him know to keep Colonoscopy appt.

## 2011-12-02 NOTE — Telephone Encounter (Signed)
Attempted to contact patient again to inform him to keep Colonoscopy appt on 12/10/11.

## 2011-12-04 NOTE — Progress Notes (Signed)
HPI:  [name] and I had a very long discussion today regarding his situation.  He was admitted from general medicine with a stroke, and was noted to have a little bit of atrial fib.  It should be pointed out that I was called about this, and noted to them the findings with regard to anemia, and the fact that endoscopy had been planned.  He then got placed on ASA and plavix, but then saw his primary back who made adjustments to this.  Dr. Jonny Ruiz stopped his plavix. He was not only anemic, but profoundly microcytic.  I did not see him during that admission, but felt that he should see Dr. Jonny Ruiz back primarily to decide on treatment.  Despite the potential role of antithrombins, given his microcytic changes, it seems that it would be dangerous in the absence of endoscopy.  He has had no new symptoms.    Current Outpatient Prescriptions  Medication Sig Dispense Refill  . aspirin 81 MG EC tablet Take 1 tablet (81 mg total) by mouth daily. Swallow whole.  30 tablet  12  . Bisacodyl (LAXATIVE PO) Take 25 mg by mouth as needed.      . castor oil liquid Take 5 mLs by mouth daily as needed. For constipation.      . cloNIDine (CATAPRES) 0.1 MG tablet Take 1 tablet (0.1 mg total) by mouth 2 (two) times daily.  180 tablet  3  . fish oil-omega-3 fatty acids 1000 MG capsule Take 2 g by mouth daily.        Marland Kitchen ibuprofen (ADVIL,MOTRIN) 200 MG tablet Take 200 mg by mouth every 6 (six) hours as needed.      Marland Kitchen lisinopril-hydrochlorothiazide (ZESTORETIC) 20-12.5 MG per tablet Take 1 tablet by mouth daily.  90 tablet  3  . peg 3350 powder (MOVIPREP) SOLR MOVI PREP take as directed  1 kit  0  . simvastatin (ZOCOR) 20 MG tablet Take 1 tablet (20 mg total) by mouth at bedtime.  90 tablet  3    No Known Allergies  Past Medical History  Diagnosis Date  . Hypertension     echocardiogram 6/10: Moderate LVH, EF 55-65%, mild, mild MR, MAC, mild LAE, PASP 32  . Dyslipidemia   . History of stroke   . CAD (coronary artery  disease)     cath in 1/08: EF 60%, mild dilated Ao root, oD2 30%, LAD 20-30%.  . Paroxysmal atrial fibrillation   . Anemia, iron deficiency 08/15/2011  . History of pneumonia 08/15/2011  . History of MI (myocardial infarction) 08/15/2011  . 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 Stroke 10/2011  . Myocardial infarction   . Hyperlipidemia     Past Surgical History  Procedure Date  . Cardiac catheterization     Family History  Problem Relation Age of Onset  . Colon cancer Neg Hx   . Aneurysm Sister     History   Social History  . Marital Status: Widowed    Spouse Name: N/A    Number of Children: 2  . Years of Education: N/A   Occupational History  . retired    Social History Main Topics  . Smoking status: Never Smoker   . Smokeless tobacco: Never Used  . Alcohol Use: No  . Drug Use: No  . Sexually Active: Not on file   Other Topics Concern  . Not on file   Social History Narrative  . No narrative on file    ROS: Please see  the HPI.  All other systems reviewed and negative.  PHYSICAL EXAM:  BP 140/80  Pulse 71  Resp 18  Ht 5\' 5"  (1.651 m)  Wt 180 lb (81.647 kg)  BMI 29.95 kg/m2  SpO2 97%  General: Well developed, well nourished, in no acute distress. Head:  Normocephalic and atraumatic. Neck: no JVD Lungs: Clear to auscultation and percussion. Heart: Normal S1 and S2.  No murmur, rubs or gallops.  Abdomen:  Normal bowel sounds; soft; non tender; no organomegaly Pulses: Pulses normal in all 4 extremities. Extremities: No clubbing or cyanosis. No edema. Neurologic: Alert and oriented x 3.  EKG:  NSR.  Nonspecific T abnormality.  ECHO    Study Conclusions  - Left ventricle: The cavity size was normal. Wall thickness was increased in a pattern of mild LVH. Systolic function was normal. The estimated ejection fraction was in the range of 55% to 60%. - Aortic valve: Mild regurgitation. - Mitral valve: Mild regurgitation. - Left atrium: The atrium was  mildly dilated. - Atrial septum: No defect or patent foramen ovale was identified. - Pulmonary arteries: PA peak pressure: 37mm Hg (S).    ASSESSMENT AND PLAN:

## 2011-12-04 NOTE — Telephone Encounter (Signed)
Attempted to contact patient again.

## 2011-12-10 ENCOUNTER — Encounter: Payer: Medicare Other | Admitting: Gastroenterology

## 2011-12-17 NOTE — Assessment & Plan Note (Signed)
No current angina 

## 2011-12-17 NOTE — Assessment & Plan Note (Signed)
See MRI.  Noted apparently to have some atrial fib in the hospital, but also these findings as well.  This certainly could be the source.  Antithrombin treatment would be warranted, but he has an iron deficiency anemia, and some spotty follow up.  He also needs endoscopy.  We will get a holter and see if he has atrial fibrillation at this point.  Most importantly, he needs anemia follow up with Dr. Jonny Ruiz, and endoscopy to define what is the source of his bleeding.  Antithrombin would be helpful if all of these issues were resolved.  However, I think they would carry some risk.

## 2011-12-17 NOTE — Assessment & Plan Note (Signed)
See under CVA.

## 2011-12-17 NOTE — Assessment & Plan Note (Signed)
Needs endo and follow up of labs with Dr. Jonny Ruiz.

## 2011-12-22 ENCOUNTER — Ambulatory Visit: Payer: Self-pay | Admitting: Gastroenterology

## 2011-12-22 ENCOUNTER — Ambulatory Visit (AMBULATORY_SURGERY_CENTER): Payer: Self-pay

## 2011-12-22 DIAGNOSIS — Z1211 Encounter for screening for malignant neoplasm of colon: Secondary | ICD-10-CM

## 2011-12-22 NOTE — Progress Notes (Signed)
Pt came into office for a pre-visit but does not have a driver to stay with him at his colonoscopy appt. Pt cancelled the pre-visit and will call back later to reschedule his appointments. Ulis Rias RN

## 2011-12-23 ENCOUNTER — Encounter: Payer: Self-pay | Admitting: Gastroenterology

## 2012-01-19 ENCOUNTER — Ambulatory Visit (AMBULATORY_SURGERY_CENTER): Payer: Self-pay | Admitting: *Deleted

## 2012-01-19 VITALS — Ht 66.5 in | Wt 185.7 lb

## 2012-01-19 DIAGNOSIS — D509 Iron deficiency anemia, unspecified: Secondary | ICD-10-CM

## 2012-01-20 ENCOUNTER — Ambulatory Visit (INDEPENDENT_AMBULATORY_CARE_PROVIDER_SITE_OTHER): Payer: Self-pay | Admitting: Cardiology

## 2012-01-20 ENCOUNTER — Encounter: Payer: Self-pay | Admitting: Cardiology

## 2012-01-20 VITALS — BP 138/80 | HR 80 | Ht 66.0 in | Wt 187.0 lb

## 2012-01-20 DIAGNOSIS — I639 Cerebral infarction, unspecified: Secondary | ICD-10-CM

## 2012-01-20 DIAGNOSIS — I6350 Cerebral infarction due to unspecified occlusion or stenosis of unspecified cerebral artery: Secondary | ICD-10-CM

## 2012-01-20 DIAGNOSIS — I2510 Atherosclerotic heart disease of native coronary artery without angina pectoris: Secondary | ICD-10-CM

## 2012-01-20 DIAGNOSIS — I480 Paroxysmal atrial fibrillation: Secondary | ICD-10-CM

## 2012-01-20 DIAGNOSIS — I4891 Unspecified atrial fibrillation: Secondary | ICD-10-CM

## 2012-01-20 DIAGNOSIS — I10 Essential (primary) hypertension: Secondary | ICD-10-CM

## 2012-01-20 NOTE — Patient Instructions (Addendum)
Your physician recommends that you have lab work today: CBC and BMP  Your physician recommends that you schedule a follow-up appointment in: 2 MONTHS with Dr Riley Kill

## 2012-01-21 LAB — BASIC METABOLIC PANEL
BUN: 25 mg/dL — ABNORMAL HIGH (ref 6–23)
CO2: 26 mEq/L (ref 19–32)
Calcium: 9.1 mg/dL (ref 8.4–10.5)
GFR: 57.29 mL/min — ABNORMAL LOW (ref >60.00)
Glucose, Bld: 81 mg/dL (ref 70–99)

## 2012-01-21 LAB — CBC WITH DIFFERENTIAL/PLATELET
Basophils Absolute: 0 10*3/uL (ref 0.0–0.1)
Eosinophils Absolute: 0.1 10*3/uL (ref 0.0–0.7)
Lymphocytes Relative: 42.1 % (ref 12.0–46.0)
Lymphs Abs: 2.3 10*3/uL (ref 0.7–4.0)
MCHC: 31.1 g/dL (ref 30.0–36.0)
Monocytes Relative: 6.7 % (ref 3.0–12.0)
Neutro Abs: 2.6 10*3/uL (ref 1.4–7.7)
Platelets: 182 10*3/uL (ref 150.0–400.0)
RDW: 17 % — ABNORMAL HIGH (ref 11.5–14.6)

## 2012-01-25 ENCOUNTER — Encounter: Payer: Self-pay | Admitting: Gastroenterology

## 2012-01-25 ENCOUNTER — Telehealth: Payer: Self-pay | Admitting: Gastroenterology

## 2012-01-25 NOTE — Telephone Encounter (Signed)
No charge. 

## 2012-01-28 ENCOUNTER — Ambulatory Visit (INDEPENDENT_AMBULATORY_CARE_PROVIDER_SITE_OTHER): Payer: Self-pay | Admitting: Internal Medicine

## 2012-01-28 ENCOUNTER — Encounter: Payer: Self-pay | Admitting: Internal Medicine

## 2012-01-28 VITALS — BP 150/82 | HR 76 | Temp 97.6°F | Ht 66.5 in | Wt 189.2 lb

## 2012-01-28 DIAGNOSIS — D509 Iron deficiency anemia, unspecified: Secondary | ICD-10-CM

## 2012-01-28 DIAGNOSIS — I10 Essential (primary) hypertension: Secondary | ICD-10-CM

## 2012-01-28 DIAGNOSIS — M79609 Pain in unspecified limb: Secondary | ICD-10-CM

## 2012-01-28 DIAGNOSIS — N400 Enlarged prostate without lower urinary tract symptoms: Secondary | ICD-10-CM

## 2012-01-28 DIAGNOSIS — R351 Nocturia: Secondary | ICD-10-CM

## 2012-01-28 DIAGNOSIS — M79606 Pain in leg, unspecified: Secondary | ICD-10-CM | POA: Insufficient documentation

## 2012-01-28 MED ORDER — SIMVASTATIN 20 MG PO TABS: 20.0000 mg | ORAL_TABLET | Freq: Every day | ORAL | Status: DC

## 2012-01-28 MED ORDER — LISINOPRIL-HYDROCHLOROTHIAZIDE 20-12.5 MG PO TABS: 1.0000 | ORAL_TABLET | Freq: Every day | ORAL | Status: DC

## 2012-01-28 MED ORDER — AMITRIPTYLINE HCL 50 MG PO TABS: 50.0000 mg | ORAL_TABLET | Freq: Every evening | ORAL | Status: DC | PRN

## 2012-01-28 NOTE — Patient Instructions (Addendum)
Take all new medications as prescribed - the medication in the evening for the leg pain Continue all other medications as before Your refills were sent today as reqeusted Please have the pharmacy call with any other refills you may need. You will be contacted regarding the referral for: urology Please call to set up your colonoscopy as you have planned Please return in 6 mo with Lab testing done 3-5 days before

## 2012-01-28 NOTE — Progress Notes (Signed)
Subjective:    Patient ID: [name], male    DOB: 04-01-1941, 71 y.o.   MRN: [id number]  HPI  Here to f/u;  States LE's below the knees at night causes "irritaiton", tingling, burning, hard to get to sleep.  Does also have to get up 3 times per night to the BR/nocturia .  Pt denies chest pain, increased sob or doe, wheezing, orthopnea, PND, increased LE swelling, palpitations, dizziness or syncope.  Pt denies new neurological symptoms such as new headache, or facial or extremity weakness or numbness   Pt denies polydipsia, polyuria,  Still not had his colonoscopy.  No overt bleeding or bruising.  Denies worsening depressive symptoms, suicidal ideation, or panic Past Medical History  Diagnosis Date  . Hypertension     echocardiogram 6/10: Moderate LVH, EF 55-65%, mild, mild MR, MAC, mild LAE, PASP 32  . Dyslipidemia   . History of stroke   . CAD (coronary artery disease)     cath in 1/08: EF 60%, mild dilated Ao root, oD2 30%, LAD 20-30%.  . Paroxysmal atrial fibrillation   . Anemia, iron deficiency 08/15/2011  . History of pneumonia 08/15/2011  . History of MI (myocardial infarction) 08/15/2011  . 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 Stroke 10/2011  . Myocardial infarction   . Hyperlipidemia    Past Surgical History  Procedure Date  . Cardiac catheterization     reports that he has never smoked. He has never used smokeless tobacco. He reports that he does not drink alcohol or use illicit drugs. family history includes Aneurysm in his sister.  There is no history of Colon cancer. No Known Allergies Current Outpatient Prescriptions on File Prior to Visit  Medication Sig Dispense Refill  . aspirin 81 MG EC tablet Take 1 tablet (81 mg total) by mouth daily. Swallow whole.  30 tablet  12  . castor oil liquid Take 5 mLs by mouth daily as needed. For constipation.      . cloNIDine (CATAPRES) 0.1 MG tablet Take 1 tablet (0.1 mg total) by mouth 2 (two) times daily.  180 tablet  3  . fish  oil-omega-3 fatty acids 1000 MG capsule Take 2 g by mouth daily.        Marland Kitchen ibuprofen (ADVIL,MOTRIN) 200 MG tablet Take 200 mg by mouth every 6 (six) hours as needed.      Marland Kitchen lisinopril-hydrochlorothiazide (ZESTORETIC) 20-12.5 MG per tablet Take 1 tablet by mouth daily.  90 tablet  3  . simvastatin (ZOCOR) 20 MG tablet Take 1 tablet (20 mg total) by mouth at bedtime.  90 tablet  3  . amitriptyline (ELAVIL) 50 MG tablet Take 1 tablet (50 mg total) by mouth at bedtime as needed for sleep.  90 tablet  1  . Bisacodyl (LAXATIVE PO) Take 25 mg by mouth as needed.       lReview of Systems Constitutional: Negative for diaphoresis and unexpected weight change.  HENT: Negative for tinnitus.   Eyes: Negative for photophobia and visual disturbance.  Respiratory: Negative for choking and stridor.   Gastrointestinal: Negative for vomiting and blood in stool.  Genitourinary: Negative for hematuria and decreased urine volume.  Musculoskeletal: Negative for gait problem.  Skin: Negative for color change and wound.  Neurological: Negative for tremors and numbness.  Psychiatric/Behavioral: Negative for decreased concentration. The patient is not hyperactive.      Objective:   Physical Exam BP 150/82  Pulse 76  Temp 97.6 F (36.4 C) (Oral)  Ht  5' 6.5" (1.689 m)  Wt 189 lb 4 oz (85.843 kg)  BMI 30.09 kg/m2  SpO2 99% Physical Exam  VS noted Constitutional: Pt appears well-developed and well-nourished.  HENT: Head: Normocephalic.  Right Ear: External ear normal.  Left Ear: External ear normal.  Eyes: Conjunctivae and EOM are normal. Pupils are equal, round, and reactive to light.  Neck: Normal range of motion. Neck supple.  Cardiovascular: Normal rate and regular rhythm.   Pulmonary/Chest: Effort normal and breath sounds normal.  Abd:  Soft, NT, non-distended, + BS Neurological: Pt is alert.  Skin: Skin is warm. No erythema.  Psychiatric: 1+ nervous    Assessment & Plan:

## 2012-01-30 NOTE — Assessment & Plan Note (Signed)
Likely related to BPH- for urology referral

## 2012-01-30 NOTE — Assessment & Plan Note (Signed)
stable overall by hx and exam, most recent data reviewed with pt, and pt to continue medical treatment as before Lab Results  Component Value Date   WBC 5.3 01/20/2012   HGB 11.5* 01/20/2012   HCT 37.0* 01/20/2012   MCV 70.6* 01/20/2012   PLT 182.0 01/20/2012

## 2012-01-30 NOTE — Assessment & Plan Note (Signed)
.  stagble Lab Results  Component Value Date   CREATININE 1.6* 01/20/2012

## 2012-01-30 NOTE — Assessment & Plan Note (Signed)
Suggestive neuropathic pain - for trial elavil qhs, declines ncs for now

## 2012-01-30 NOTE — Assessment & Plan Note (Signed)
Mild elev today but stable overall by hx and exam, most recent data reviewed with pt, and pt to continue medical treatment as before BP Readings from Last 3 Encounters:  01/28/12 150/82  01/20/12 138/80  12/01/11 140/80

## 2012-01-30 NOTE — Assessment & Plan Note (Signed)
With nocturia - for urology referral

## 2012-02-03 NOTE — Progress Notes (Signed)
HPI:  The patient says he is 85% better than he was. He does complain of some pain in his legs. He recently could not get a couple of his medications, and he also was placed medications, is not sure which ones. He says that Dr. Jonny Ruiz took away several of his pills. His colonoscopy has been delayed. Despite the patient's presenting complaints, it was pretty clear that he was significantly anemic. This was a microcytic anemia, and this is been managed by Dr. Jonny Ruiz.  The patient has not been having any cardiac type chest pain he had been noted to be in atrial fibrillation in the past. This was also noted during the hospitalization and could of been the source of his neurologic event. Given these findings however, he has not been a candidate for antithrombin therapy.  Current Outpatient Prescriptions  Medication Sig Dispense Refill  . aspirin 81 MG EC tablet Take 1 tablet (81 mg total) by mouth daily. Swallow whole.  30 tablet  12  . Bisacodyl (LAXATIVE PO) Take 25 mg by mouth as needed.      . castor oil liquid Take 5 mLs by mouth daily as needed. For constipation.      . cloNIDine (CATAPRES) 0.1 MG tablet Take 1 tablet (0.1 mg total) by mouth 2 (two) times daily.  180 tablet  3  . fish oil-omega-3 fatty acids 1000 MG capsule Take 2 g by mouth daily.        Marland Kitchen ibuprofen (ADVIL,MOTRIN) 200 MG tablet Take 200 mg by mouth every 6 (six) hours as needed.      Marland Kitchen amitriptyline (ELAVIL) 50 MG tablet Take 1 tablet (50 mg total) by mouth at bedtime as needed for sleep.  90 tablet  1  . lisinopril-hydrochlorothiazide (ZESTORETIC) 20-12.5 MG per tablet Take 1 tablet by mouth daily.  90 tablet  3  . simvastatin (ZOCOR) 20 MG tablet Take 1 tablet (20 mg total) by mouth at bedtime.  90 tablet  3    No Known Allergies  Past Medical History  Diagnosis Date  . Hypertension     echocardiogram 6/10: Moderate LVH, EF 55-65%, mild, mild MR, MAC, mild LAE, PASP 32  . Dyslipidemia   . History of stroke   . CAD  (coronary artery disease)     cath in 1/08: EF 60%, mild dilated Ao root, oD2 30%, LAD 20-30%.  . Paroxysmal atrial fibrillation   . Anemia, iron deficiency 08/15/2011  . History of pneumonia 08/15/2011  . History of MI (myocardial infarction) 08/15/2011  . 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 Stroke 10/2011  . Myocardial infarction   . Hyperlipidemia     Past Surgical History  Procedure Date  . Cardiac catheterization     Family History  Problem Relation Age of Onset  . Colon cancer Neg Hx   . Aneurysm Sister     History   Social History  . Marital Status: Widowed    Spouse Name: N/A    Number of Children: 2  . Years of Education: N/A   Occupational History  . retired    Social History Main Topics  . Smoking status: Never Smoker   . Smokeless tobacco: Never Used  . Alcohol Use: No  . Drug Use: No  . Sexually Active: Not on file   Other Topics Concern  . Not on file   Social History Narrative  . No narrative on file    ROS: Please see the HPI.  All other systems  reviewed and negative.  PHYSICAL EXAM:  BP 138/80  Pulse 80  Ht 5\' 6"  (1.676 m)  Wt 187 lb (84.823 kg)  BMI 30.18 kg/m2  General: Well developed, well nourished, in no acute distress. Head:  Normocephalic and atraumatic. Neck: no JVD Lungs: Clear to auscultation and percussion. Heart: Normal S1 and S2.  No murmur, rubs or gallops.  Abdomen:  Normal bowel sounds; soft; non tender; no organomegaly Pulses: Pulses normal in all 4 extremities. Extremities: No clubbing or cyanosis. No edema. Neurologic: Alert and oriented x 3.  EKG:  ECHO Study Conclusions  - Left ventricle: The cavity size was normal. Wall thickness was increased in a pattern of mild LVH. Systolic function was normal. The estimated ejection fraction was in the range of 55% to 60%. - Aortic valve: Mild regurgitation. - Mitral valve: Mild regurgitation. - Left atrium: The atrium was mildly dilated. - Atrial septum: No defect  or patent foramen ovale was identified. - Pulmonary arteries: PA peak pressure: 37mm Hg (S).     ASSESSMENT AND PLAN:

## 2012-02-14 NOTE — Assessment & Plan Note (Signed)
See note on CVA.

## 2012-02-14 NOTE — Assessment & Plan Note (Signed)
Stable at present.   

## 2012-02-14 NOTE — Assessment & Plan Note (Signed)
The patient has multiple sources for potential CVA. Certainly, atrial fib to be one of these. He did not show up previously for his per minute. He was noted to have atrial fibrillation in the hospital,[id number]. profound microcytic anemia had been scheduled for outpatient colonoscopy by Dr. Jonny Ruiz. The colonoscopy has yet to be done. His labs will need to be scrutinized carefully. The patient is also fairly unreliable, frequently getting his medications confused and not taking the right ones.  He clearly is at risk for recurrent stroke, I think he clearly also needs to have a colonoscopy to find out what that situation is. After that, some decisions could be made. He would likely be best managed by once a day therapy, but there would be concerned regarding managing all of this.  He would need to see Dr. Jonny Ruiz regularly.

## 2012-02-17 ENCOUNTER — Telehealth: Payer: Self-pay | Admitting: *Deleted

## 2012-02-17 ENCOUNTER — Telehealth: Payer: Self-pay | Admitting: Gastroenterology

## 2012-02-17 NOTE — Telephone Encounter (Signed)
Pt. Read on Movi prep the possible side effects of Movi prep and is concerned.Pt.assured that the dr.feels prep is safe. at

## 2012-02-18 ENCOUNTER — Ambulatory Visit (AMBULATORY_SURGERY_CENTER): Payer: Self-pay | Admitting: Gastroenterology

## 2012-02-18 ENCOUNTER — Encounter: Payer: Self-pay | Admitting: Gastroenterology

## 2012-02-18 VITALS — BP 198/105 | HR 76 | Temp 98.0°F | Resp 18 | Ht 66.0 in | Wt 185.0 lb

## 2012-02-18 DIAGNOSIS — D509 Iron deficiency anemia, unspecified: Secondary | ICD-10-CM

## 2012-02-18 MED ORDER — SODIUM CHLORIDE 0.9 % IV SOLN: 500.0000 mL | INTRAVENOUS | Status: DC

## 2012-02-18 NOTE — Op Note (Signed)
[facility] [address] [place], [place]  [address]  COLONOSCOPY PROCEDURE REPORT  PATIENT:  [name], [name]  MR#:  [id number] BIRTHDATE:  January 10, 1941, 71 yrs. old  GENDER:  male ENDOSCOPIST:  Judie Petit T. Russella Dar, MD, [facility]  PROCEDURE DATE:  02/18/2012 PROCEDURE:  Colonoscopy 47829 ASA CLASS:  Class III INDICATIONS:  1) Iron deficiency anemia MEDICATIONS:   MAC sedation, administered by CRNA, propofol (Diprivan) 250 mg IV DESCRIPTION OF PROCEDURE:   After the risks benefits and alternatives of the procedure were thoroughly explained, informed consent was obtained.  Digital rectal exam was performed and revealed no abnormalities.   The LB CF-[id number] endoscope was introduced through the anus and advanced to the cecum, which was identified by both the appendix and ileocecal valve, limited by a tortuous colon, especially in the right colon.   The quality of the prep was good, using MoviPrep.  The instrument was then slowly withdrawn as the colon was fully examined. <<PROCEDUREIMAGES>> FINDINGS:  A normal appearing cecum, ileocecal valve, and appendiceal orifice were identified. The ascending, hepatic flexure, transverse, splenic flexure, descending, sigmoid colon, and rectum appeared unremarkable.  Retroflexed views in the rectum revealed internal hemorrhoids, small.  The time to cecum =  7 minutes. The scope was then withdrawn (time =  15.25  min) from the patient and the procedure completed.  COMPLICATIONS:  None  ENDOSCOPIC IMPRESSION: 1) Normal colon 2) Internal hemorrhoids  RECOMMENDATIONS: 1) Repeat Colonoscopy in 5 years given limitations of tortuous colon. 2) If fe deficiency does not correct or it recurs consider evaluation of UGI tract and SB  Harrol Novello T. Russella Dar, MD, [facility]  n. eSIGNED:   Venita Lick. Marcha Licklider at 02/18/2012 10:34 AM  Mateo Flow, [id number]

## 2012-02-18 NOTE — Patient Instructions (Addendum)
Discharge instructions given with verbal understanding. Handouts on hemorrhoids and a high fiber diet given. Resume previous medications. YOU HAD AN ENDOSCOPIC PROCEDURE TODAY AT THE [facility]: Refer to the procedure report that was given to you for any specific questions about what was found during the examination.  If the procedure report does not answer your questions, please call your gastroenterologist to clarify.  If you requested that your care partner not be given the details of your procedure findings, then the procedure report has been included in a sealed envelope for you to review at your convenience later.  YOU SHOULD EXPECT: Some feelings of bloating in the abdomen. Passage of more gas than usual.  Walking can help get rid of the air that was put into your GI tract during the procedure and reduce the bloating. If you had a lower endoscopy (such as a colonoscopy or flexible sigmoidoscopy) you may notice spotting of blood in your stool or on the toilet paper. If you underwent a bowel prep for your procedure, then you may not have a normal bowel movement for a few days.  DIET: Your first meal following the procedure should be a light meal and then it is ok to progress to your normal diet.  A half-sandwich or bowl of soup is an example of a good first meal.  Heavy or fried foods are harder to digest and may make you feel nauseous or bloated.  Likewise meals heavy in dairy and vegetables can cause extra gas to form and this can also increase the bloating.  Drink plenty of fluids but you should avoid alcoholic beverages for 24 hours.  ACTIVITY: Your care partner should take you home directly after the procedure.  You should plan to take it easy, moving slowly for the rest of the day.  You can resume normal activity the day after the procedure however you should NOT DRIVE or use heavy machinery for 24 hours (because of the sedation medicines used during the test).    SYMPTOMS TO  REPORT IMMEDIATELY: A gastroenterologist can be reached at any hour.  During normal business hours, 8:30 AM to 5:00 PM Monday through Friday, call [phone].  After hours and on weekends, please call the GI answering service at [phone] who will take a message and have the physician on call contact you.   Following lower endoscopy (colonoscopy or flexible sigmoidoscopy):  Excessive amounts of blood in the stool  Significant tenderness or worsening of abdominal pains  Swelling of the abdomen that is new, acute  Fever of 100F or higher FOLLOW UP: If any biopsies were taken you will be contacted by phone or by letter within the next 1-3 weeks.  Call your gastroenterologist if you have not heard about the biopsies in 3 weeks.  Our staff will call the home number listed on your records the next business day following your procedure to check on you and address any questions or concerns that you may have at that time regarding the information given to you following your procedure. This is a courtesy call and so if there is no answer at the home number and we have not heard from you through the emergency physician on call, we will assume that you have returned to your regular daily activities without incident.  SIGNATURES/CONFIDENTIALITY: You and/or your care partner have signed paperwork which will be entered into your electronic medical record.  These signatures attest to the fact that that the information above on your   After Visit Summary has been reviewed and is understood.  Full responsibility of the confidentiality of this discharge information lies with you and/or your care-partner.  

## 2012-02-19 ENCOUNTER — Telehealth: Payer: Self-pay | Admitting: *Deleted

## 2012-02-19 NOTE — Telephone Encounter (Signed)
No answer. Attempted to call x2. Let phone ring 7 times. No message left.

## 2012-03-04 ENCOUNTER — Encounter: Payer: Self-pay | Admitting: Gastroenterology

## 2012-03-23 ENCOUNTER — Ambulatory Visit (INDEPENDENT_AMBULATORY_CARE_PROVIDER_SITE_OTHER): Payer: Medicare Other | Admitting: Cardiology

## 2012-03-23 ENCOUNTER — Encounter: Payer: Self-pay | Admitting: Cardiology

## 2012-03-23 VITALS — BP 195/91 | HR 62 | Ht 66.0 in | Wt 182.0 lb

## 2012-03-23 DIAGNOSIS — I2510 Atherosclerotic heart disease of native coronary artery without angina pectoris: Secondary | ICD-10-CM

## 2012-03-23 DIAGNOSIS — D509 Iron deficiency anemia, unspecified: Secondary | ICD-10-CM

## 2012-03-23 DIAGNOSIS — I10 Essential (primary) hypertension: Secondary | ICD-10-CM

## 2012-03-23 DIAGNOSIS — I4891 Unspecified atrial fibrillation: Secondary | ICD-10-CM

## 2012-03-23 NOTE — Patient Instructions (Addendum)
Your physician recommends that you continue on your current medications as directed. Please refer to the Current Medication list given to you today.  PLEASE TAKE YOUR MEDICATIONS AS PRESCRIBED.  Your physician recommends that you schedule a follow-up appointment in: 3 MONTHS  Your physician has requested that you regularly monitor and record your blood pressure readings at home. Please use the same machine at the same time of day to check your readings and record them to bring to your follow-up visit.  Your physician recommends that you have lab work today: BMP and CBC

## 2012-03-24 LAB — CBC WITH DIFFERENTIAL/PLATELET
Basophils Absolute: 0 10*3/uL (ref 0.0–0.1)
Eosinophils Absolute: 0.1 10*3/uL (ref 0.0–0.7)
HCT: 38.6 % — ABNORMAL LOW (ref 39.0–52.0)
Lymphs Abs: 1.9 10*3/uL (ref 0.7–4.0)
MCV: 70.3 fl — ABNORMAL LOW (ref 78.0–100.0)
Monocytes Absolute: 0.3 10*3/uL (ref 0.1–1.0)
Monocytes Relative: 6.4 % (ref 3.0–12.0)
Platelets: 177 10*3/uL (ref 150.0–400.0)
RDW: 15.5 % — ABNORMAL HIGH (ref 11.5–14.6)

## 2012-03-24 LAB — BASIC METABOLIC PANEL
Calcium: 8.8 mg/dL (ref 8.4–10.5)
GFR: 94.95 mL/min (ref >60.00)
Sodium: 136 mEq/L (ref 135–145)

## 2012-03-25 ENCOUNTER — Telehealth: Payer: Self-pay | Admitting: *Deleted

## 2012-03-25 NOTE — Telephone Encounter (Signed)
Message copied by Tarri Fuller on Fri Mar 25, 2012  8:27 AM ------      Message from: Shawnie Pons D      Created: Fri Mar 25, 2012  6:38 AM       Let patient know these labs are stable.  Thanks TS

## 2012-03-25 NOTE — Telephone Encounter (Signed)
Pt notified of lab results..cmf

## 2012-03-26 NOTE — Progress Notes (Addendum)
HPI:  The patient is been seen back in followup. He seen Dr. Jonny Ruiz, and they've been in the process of evaluating his anemia. When the patient was in the hospital previously, he had atrial fibrillation, but because of the anemia was not felt to be a good candidate for anticoagulation therapy. There is also been a great concern about his reliability with medications.  His blood pressures markedly elevated today, and he admits that he did not take his medications. 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 He promises me that he plans to go back promptly to get his medications and start taking them again. He denies any current chest pain.  Current Outpatient Prescriptions  Medication Sig Dispense Refill  . amitriptyline (ELAVIL) 50 MG tablet Take 1 tablet (50 mg total) by mouth at bedtime as needed for sleep.  90 tablet  1  . aspirin 81 MG EC tablet Take 1 tablet (81 mg total) by mouth daily. Swallow whole.  30 tablet  12  . Bisacodyl (LAXATIVE PO) Take 25 mg by mouth as needed.      . castor oil liquid Take 5 mLs by mouth daily as needed. For constipation.      . cloNIDine (CATAPRES) 0.1 MG tablet Take 1 tablet (0.1 mg total) by mouth 2 (two) times daily.  180 tablet  3  . fish oil-omega-3 fatty acids 1000 MG capsule Take 2 g by mouth daily.        Marland Kitchen ibuprofen (ADVIL,MOTRIN) 200 MG tablet Take 200 mg by mouth every 6 (six) hours as needed.      Marland Kitchen lisinopril-hydrochlorothiazide (ZESTORETIC) 20-12.5 MG per tablet Take 1 tablet by mouth daily.  90 tablet  3  . simvastatin (ZOCOR) 20 MG tablet Take 1 tablet (20 mg total) by mouth at bedtime.  90 tablet  3    No Known Allergies  Past Medical History  Diagnosis Date  . Hypertension     echocardiogram 6/10: Moderate LVH, EF 55-65%, mild, mild MR, MAC, mild LAE, PASP 32  . Dyslipidemia   . History of stroke   . CAD (coronary artery disease)     cath in 1/08: EF 60%, mild dilated Ao root, oD2 30%, LAD 20-30%.  . Paroxysmal  atrial fibrillation   . Anemia, iron deficiency 08/15/2011  . History of pneumonia 08/15/2011  . History of MI (myocardial infarction) 08/15/2011  . 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 Stroke 10/2011  . Myocardial infarction   . Hyperlipidemia     Past Surgical History  Procedure Date  . Cardiac catheterization     Family History  Problem Relation Age of Onset  . Colon cancer Neg Hx   . Aneurysm Sister     History   Social History  . Marital Status: Widowed    Spouse Name: N/A    Number of Children: 2  . Years of Education: N/A   Occupational History  . retired    Social History Main Topics  . Smoking status: Never Smoker   . Smokeless tobacco: Never Used  . Alcohol Use: No  . Drug Use: No  . Sexually Active: Not on file   Other Topics Concern  . Not on file   Social History Narrative  . No narrative on file    ROS: Please see the HPI.  All other systems reviewed and negative.  PHYSICAL EXAM:  BP 195/91  Pulse 62  Ht 5\' 6"  (1.676 m)  Wt  182 lb (82.555 kg)  BMI 29.38 kg/m2  General: Well developed, well nourished, in no acute distress. Head:  Normocephalic and atraumatic. Neck: no JVD Lungs: Clear to auscultation and percussion. Heart: Normal S1 and S2.  No murmur, rubs or gallops.  Abdomen:  Normal bowel sounds; soft; non tender; no organomegaly Pulses: Pulses normal in all 4 extremities. Extremities: No clubbing or cyanosis. No edema. Neurologic: Alert and oriented x 3.  EKG:  NSR. LVH with likely repolarization changes vs. Inferolateral ischemia. Slightly more prominent T changes from 4/30, but similar to tracing of 11/20/2011  ASSESSMENT AND PLAN:

## 2012-04-11 ENCOUNTER — Ambulatory Visit: Payer: Medicare Other | Admitting: Internal Medicine

## 2012-04-11 DIAGNOSIS — Z0289 Encounter for other administrative examinations: Secondary | ICD-10-CM

## 2012-04-18 DIAGNOSIS — I4891 Unspecified atrial fibrillation: Secondary | ICD-10-CM | POA: Insufficient documentation

## 2012-04-18 NOTE — Assessment & Plan Note (Signed)
Being followed by Dr. Jonny Ruiz.

## 2012-04-18 NOTE — Assessment & Plan Note (Signed)
Currently in NSR.  Had af in the hospital, but was not placed on antithrombotic therapy because of anemia.  Had colonscopy with internal hemhorroids. Not a good candidate for therapy because of reliability with medications, but it could be considered in the correct forum.  May need upper.  Microcytosis has not corrected.  Will continue to follow closely.

## 2012-06-28 ENCOUNTER — Ambulatory Visit: Payer: Medicare Other | Admitting: Cardiology

## 2012-07-21 ENCOUNTER — Ambulatory Visit: Payer: Medicare Other | Admitting: Internal Medicine

## 2012-07-21 DIAGNOSIS — Z0289 Encounter for other administrative examinations: Secondary | ICD-10-CM

## 2012-11-05 ENCOUNTER — Other Ambulatory Visit: Payer: Self-pay | Admitting: Internal Medicine

## 2012-12-19 ENCOUNTER — Encounter (HOSPITAL_COMMUNITY): Payer: Self-pay | Admitting: Emergency Medicine

## 2012-12-19 ENCOUNTER — Emergency Department (HOSPITAL_COMMUNITY)
Admission: EM | Admit: 2012-12-19 | Discharge: 2012-12-19 | Disposition: A | Discharge status: Home / Self Care | Payer: Medicare Other | Attending: Emergency Medicine | Admitting: Emergency Medicine

## 2012-12-19 ENCOUNTER — Emergency Department (HOSPITAL_COMMUNITY): Payer: Medicare Other

## 2012-12-19 DIAGNOSIS — Z79899 Other long term (current) drug therapy: Secondary | ICD-10-CM | POA: Insufficient documentation

## 2012-12-19 DIAGNOSIS — I252 Old myocardial infarction: Secondary | ICD-10-CM | POA: Insufficient documentation

## 2012-12-19 DIAGNOSIS — Z9861 Coronary angioplasty status: Secondary | ICD-10-CM | POA: Insufficient documentation

## 2012-12-19 DIAGNOSIS — E785 Hyperlipidemia, unspecified: Secondary | ICD-10-CM | POA: Insufficient documentation

## 2012-12-19 DIAGNOSIS — Z862 Personal history of diseases of the blood and blood-forming organs and certain disorders involving the immune mechanism: Secondary | ICD-10-CM | POA: Insufficient documentation

## 2012-12-19 DIAGNOSIS — I10 Essential (primary) hypertension: Secondary | ICD-10-CM | POA: Insufficient documentation

## 2012-12-19 DIAGNOSIS — Z8679 Personal history of other diseases of the circulatory system: Secondary | ICD-10-CM | POA: Insufficient documentation

## 2012-12-19 DIAGNOSIS — I2510 Atherosclerotic heart disease of native coronary artery without angina pectoris: Secondary | ICD-10-CM | POA: Insufficient documentation

## 2012-12-19 DIAGNOSIS — Z8701 Personal history of pneumonia (recurrent): Secondary | ICD-10-CM | POA: Insufficient documentation

## 2012-12-19 DIAGNOSIS — Z8673 Personal history of transient ischemic attack (TIA), and cerebral infarction without residual deficits: Secondary | ICD-10-CM | POA: Insufficient documentation

## 2012-12-19 DIAGNOSIS — M25619 Stiffness of unspecified shoulder, not elsewhere classified: Secondary | ICD-10-CM | POA: Insufficient documentation

## 2012-12-19 LAB — CBC WITH DIFFERENTIAL/PLATELET
Basophils Relative: 0 % (ref 0–1)
Eosinophils Relative: 1 % (ref 0–5)
Hemoglobin: 12.7 g/dL — ABNORMAL LOW (ref 13.0–17.0)
Lymphocytes Relative: 38 % (ref 12–46)
Neutrophils Relative %: 54 % (ref 43–77)
RBC: 5.89 MIL/uL — ABNORMAL HIGH (ref 4.22–5.81)
WBC: 4.4 10*3/uL (ref 4.0–10.5)

## 2012-12-19 LAB — BASIC METABOLIC PANEL
BUN: 13 mg/dL (ref 6–23)
Calcium: 9.4 mg/dL (ref 8.4–10.5)
Creatinine, Ser: 1.19 mg/dL (ref 0.50–1.35)
GFR calc Af Amer: 70 mL/min — ABNORMAL LOW (ref >90)
GFR calc non Af Amer: 60 mL/min — ABNORMAL LOW (ref >90)
Potassium: 3.7 mEq/L (ref 3.5–5.1)

## 2012-12-19 MED ORDER — TRAMADOL HCL 50 MG PO TABS: 50.0000 mg | ORAL_TABLET | Freq: Four times a day (QID) | ORAL | Status: DC | PRN

## 2012-12-19 NOTE — ED Notes (Signed)
Left arm and shoulder pain x1 month worse in the last 2 days hurts to move arm at all worse at night

## 2012-12-19 NOTE — ED Notes (Signed)
C/o left shoulder pain x 6 weeks, denies injury.Reports pain constant but worse with mvmt, decreased ROM due to pain. Shoulder tender to palpation, pain reproducible. Pain starts in shoulder & radiates to shoulder blade & down arm to elbow area. Denies CP, SOB, palpitations.

## 2012-12-19 NOTE — ED Provider Notes (Signed)
History    CSN: [id number] Arrival date & time 12/19/12  1356 First MD Initiated Contact with Patient 12/19/12 1638      Chief Complaint  Patient presents with  . Arm Pain    Patient is a 72 y.o. male presenting with arm pain. The history is provided by the patient.  Arm Pain This is a new problem. Episode onset: 1 month ago. The problem occurs constantly. Progression since onset: it varies in intensity. Pertinent negatives include no chest pain, no abdominal pain, no headaches and no shortness of breath. Exacerbated by: when he tries to lift his arm it hurts more. The symptoms are relieved by rest. He has tried nothing for the symptoms.  Pt has not seen anyone for it.  Past Medical History  Diagnosis Date  . Hypertension     echocardiogram 6/10: Moderate LVH, EF 55-65%, mild, mild MR, MAC, mild LAE, PASP 32  . Dyslipidemia   . History of stroke   . CAD (coronary artery disease)     cath in 1/08: EF 60%, mild dilated Ao root, oD2 30%, LAD 20-30%.  . Paroxysmal atrial fibrillation   . Anemia, iron deficiency 08/15/2011  . History of pneumonia 08/15/2011  . History of MI (myocardial infarction) 08/15/2011  . 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 Stroke 10/2011  . Myocardial infarction   . Hyperlipidemia     Past Surgical History  Procedure Laterality Date  . Cardiac catheterization      Family History  Problem Relation Age of Onset  . Colon cancer Neg Hx   . Aneurysm Sister     History  Substance Use Topics  . Smoking status: Never Smoker   . Smokeless tobacco: Never Used  . Alcohol Use: No      Review of Systems  Respiratory: Negative for shortness of breath.   Cardiovascular: Negative for chest pain.  Gastrointestinal: Negative for abdominal pain.  Neurological: Negative for headaches.  All other systems reviewed and are negative.    Allergies  Review of patient's allergies indicates no known allergies.  Home Medications   Current Outpatient Rx  Name  Route   Sig  Dispense  Refill  . lisinopril-hydrochlorothiazide (ZESTORETIC) 20-12.5 MG per tablet   Oral   Take 1 tablet by mouth daily.   90 tablet   3   . amitriptyline (ELAVIL) 50 MG tablet   Oral   Take 1 tablet (50 mg total) by mouth at bedtime as needed for sleep.   90 tablet   1   . amLODipine (NORVASC) 10 MG tablet   Oral   Take 10 mg by mouth daily.         . simvastatin (ZOCOR) 20 MG tablet   Oral   Take 1 tablet (20 mg total) by mouth at bedtime.   90 tablet   3   . traMADol (ULTRAM) 50 MG tablet   Oral   Take 1 tablet (50 mg total) by mouth every 6 (six) hours as needed for pain.   30 tablet   0     BP 228/112  Pulse 84  Temp(Src) 97.7 F (36.5 C) (Oral)  Resp 18  SpO2 99%  Physical Exam  Nursing note and vitals reviewed. Constitutional: He appears well-developed and well-nourished. No distress.  HENT:  Head: Normocephalic and atraumatic.  Right Ear: External ear normal.  Left Ear: External ear normal.  Eyes: Conjunctivae are normal. Right eye exhibits no discharge. Left eye exhibits no discharge. No scleral icterus.  Neck: Neck supple. No tracheal deviation present.  Cardiovascular: Normal rate, regular rhythm and intact distal pulses.   Pulmonary/Chest: Effort normal and breath sounds normal. No stridor. No respiratory distress. He has no wheezes. He has no rales.  Abdominal: Soft. Bowel sounds are normal. He exhibits no distension. There is no tenderness. There is no rebound and no guarding.  Musculoskeletal: He exhibits no edema and no tenderness.  Neurological: He is alert. He has normal strength. No sensory deficit. Cranial nerve deficit:  no gross defecits noted. He exhibits normal muscle tone. He displays no seizure activity. Coordination normal.  Skin: Skin is warm and dry. No rash noted.  Psychiatric: He has a normal mood and affect.    ED Course  Procedures (including critical care time) EKG Rate 84 Normal sinus rhythm Moderate voltage  criteria for LVH, may be normal variant Nonspecific ST and T wave abnormality Prolonged QT Abnormal ECG No old ekg Labs Reviewed  CBC WITH DIFFERENTIAL - Abnormal; Notable for the following:    RBC 5.89 (*)    Hemoglobin 12.7 (*)    MCV 66.7 (*)    MCH 21.6 (*)    RDW 15.7 (*)    All other components within normal limits  BASIC METABOLIC PANEL - Abnormal; Notable for the following:    GFR calc non Af Amer 60 (*)    GFR calc Af Amer 70 (*)    All other components within normal limits  TROPONIN I  POCT I-STAT TROPONIN I   Dg Shoulder Left  12/19/2012   *RADIOLOGY REPORT*  Clinical Data: Shoulder pain.  LEFT SHOULDER - 2+ VIEW  Comparison: None.  Findings: No evidence of acute fracture or dislocation.  Moderate degenerative changes are seen involving the chronic joint.  There is also degenerative spurring involving the inferior glenoid rim.  IMPRESSION:  1.  No acute findings. 2.  Degenerative joint disease.   Original Report Authenticated By: Myles Rosenthal, M.D.     1. Degenerative joint disease of shoulder region, left       MDM  The patient's symptoms most likely are musculoskeletal in nature. I suspect they're related to degenerative joint disease of his left shoulder as evidenced by his x-ray. I doubt this is referred pain.  Patient be given a prescription for ultram.  I have given him a referral to orthopedic surgery.       Celene Kras, MD 12/19/12 [phone]

## 2012-12-19 NOTE — ED Notes (Signed)
No n/v/sob

## 2012-12-19 NOTE — ED Notes (Signed)
Patient transported to X-ray 

## 2013-01-20 ENCOUNTER — Ambulatory Visit (INDEPENDENT_AMBULATORY_CARE_PROVIDER_SITE_OTHER): Payer: Medicare Other

## 2013-01-20 ENCOUNTER — Ambulatory Visit (INDEPENDENT_AMBULATORY_CARE_PROVIDER_SITE_OTHER): Payer: Medicare Other | Admitting: Internal Medicine

## 2013-01-20 ENCOUNTER — Encounter: Payer: Self-pay | Admitting: Internal Medicine

## 2013-01-20 VITALS — BP 162/90 | HR 71 | Temp 97.5°F | Ht 66.5 in | Wt 184.2 lb

## 2013-01-20 DIAGNOSIS — I10 Essential (primary) hypertension: Secondary | ICD-10-CM

## 2013-01-20 DIAGNOSIS — E785 Hyperlipidemia, unspecified: Secondary | ICD-10-CM

## 2013-01-20 DIAGNOSIS — M79609 Pain in unspecified limb: Secondary | ICD-10-CM

## 2013-01-20 DIAGNOSIS — M7542 Impingement syndrome of left shoulder: Secondary | ICD-10-CM | POA: Insufficient documentation

## 2013-01-20 DIAGNOSIS — N529 Male erectile dysfunction, unspecified: Secondary | ICD-10-CM

## 2013-01-20 LAB — URINALYSIS, ROUTINE W REFLEX MICROSCOPIC
Bilirubin Urine: NEGATIVE
Ketones, ur: NEGATIVE
Nitrite: NEGATIVE
Total Protein, Urine: NEGATIVE
pH: 6 (ref 5.0–8.0)

## 2013-01-20 LAB — HEPATIC FUNCTION PANEL
ALT: 13 U/L (ref 0–53)
AST: 19 U/L (ref 0–37)
Bilirubin, Direct: 0 mg/dL (ref 0.0–0.3)
Total Bilirubin: 0.6 mg/dL (ref 0.3–1.2)

## 2013-01-20 LAB — BASIC METABOLIC PANEL
Chloride: 104 mEq/L (ref 96–112)
GFR: 79.81 mL/min (ref >60.00)
Potassium: 3.8 mEq/L (ref 3.5–5.1)
Sodium: 137 mEq/L (ref 135–145)

## 2013-01-20 LAB — LIPID PANEL
Total CHOL/HDL Ratio: 8
VLDL: 25.2 mg/dL (ref 0.0–40.0)

## 2013-01-20 LAB — LDL CHOLESTEROL, DIRECT: Direct LDL: 230 mg/dL

## 2013-01-20 MED ORDER — AMITRIPTYLINE HCL 50 MG PO TABS: 50.0000 mg | ORAL_TABLET | Freq: Every evening | ORAL | Status: DC | PRN

## 2013-01-20 MED ORDER — LISINOPRIL-HYDROCHLOROTHIAZIDE 20-12.5 MG PO TABS: 1.0000 | ORAL_TABLET | Freq: Every day | ORAL | Status: DC

## 2013-01-20 MED ORDER — AMLODIPINE BESYLATE 10 MG PO TABS: 10.0000 mg | ORAL_TABLET | Freq: Every day | ORAL | Status: DC

## 2013-01-20 MED ORDER — TADALAFIL 20 MG PO TABS: 20.0000 mg | ORAL_TABLET | Freq: Every day | ORAL | Status: DC | PRN

## 2013-01-20 MED ORDER — SIMVASTATIN 20 MG PO TABS: 20.0000 mg | ORAL_TABLET | Freq: Every day | ORAL | Status: DC

## 2013-01-20 NOTE — Progress Notes (Signed)
Subjective:    Patient ID: [name], male    DOB: Aug 18, 1940, 72 y.o.   MRN: [id number]  HPI  Here to f/u, c/o left shoudler pain , moderate for 1 -2 wks, worse to lie on left side at night and abduct arm, no fever, trauma, neck or other arm radicular pain.  Asks for cialis for worsenign ED symptoms. overall otherwise doing ok,  Pt denies chest pain, increased sob or doe, wheezing, orthopnea, PND, increased LE swelling, palpitations, dizziness or syncope.  Pt denies polydipsia, polyuria, or low sugar symptoms such as weakness or confusion improved with po intake.  Pt denies new neurological symptoms such as new headache, or facial or extremity weakness or numbness.   Pt states overall good compliance with meds, has been trying to follow lower cholesterol diet, with wt overall stable.  Has been out of BP med for over 1 wk Past Medical History  Diagnosis Date  . Hypertension     echocardiogram 6/10: Moderate LVH, EF 55-65%, mild, mild MR, MAC, mild LAE, PASP 32  . Dyslipidemia   . History of stroke   . CAD (coronary artery disease)     cath in 1/08: EF 60%, mild dilated Ao root, oD2 30%, LAD 20-30%.  . Paroxysmal atrial fibrillation   . Anemia, iron deficiency 08/15/2011  . History of pneumonia 08/15/2011  . History of MI (myocardial infarction) 08/15/2011  . 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 Stroke 10/2011  . Myocardial infarction   . Hyperlipidemia    Past Surgical History  Procedure Laterality Date  . Cardiac catheterization      reports that he has never smoked. He has never used smokeless tobacco. He reports that he does not drink alcohol or use illicit drugs. family history includes Aneurysm in his sister.  There is no history of Colon cancer. No Known Allergies Current Outpatient Prescriptions on File Prior to Visit  Medication Sig Dispense Refill  . traMADol (ULTRAM) 50 MG tablet Take 1 tablet (50 mg total) by mouth every 6 (six) hours as needed for pain.  30 tablet  0   No  current facility-administered medications on file prior to visit.   Review of Systems  Constitutional: Negative for unexpected weight change, or unusual diaphoresis  HENT: Negative for tinnitus.   Eyes: Negative for photophobia and visual disturbance.  Respiratory: Negative for choking and stridor.   Gastrointestinal: Negative for vomiting and blood in stool.  Genitourinary: Negative for hematuria and decreased urine volume.  Musculoskeletal: Negative for acute joint swelling Skin: Negative for color change and wound.  Neurological: Negative for tremors and numbness other than noted  Psychiatric/Behavioral: Negative for decreased concentration or  hyperactivity.       Objective:   Physical Exam BP 162/90  Pulse 71  Temp(Src) 97.5 F (36.4 C) (Oral)  Ht 5' 6.5" (1.689 m)  Wt 184 lb 4 oz (83.575 kg)  BMI 29.3 kg/m2  SpO2 98% VS noted,  Constitutional: Pt appears well-developed and well-nourished.  HENT: Head: NCAT.  Right Ear: External ear normal.  Left Ear: External ear normal.  Eyes: Conjunctivae and EOM are normal. Pupils are equal, round, and reactive to light.  Neck: Normal range of motion. Neck supple.  Cardiovascular: Normal rate and regular rhythm.   Pulmonary/Chest: Effort normal and breath sounds normal.  Abd:  Soft, NT, non-distended, + BS Neurological: Pt is alert. Not confused  Skin: Skin is warm. No erythema.  Left shoulder mild tender to left subacromial, also pain  on active and passive forward elev and abduction to 90 degrees only Psychiatric: Pt behavior is normal. Thought content normal.     Assessment & Plan:

## 2013-01-20 NOTE — Patient Instructions (Signed)
OK to take the tramadol for pain at night to sleep better with the left shoulder pain Please take all new medication as prescribed - the cialis as needed  Please continue all other medications as before You will be contacted regarding the referral for: orthopedic (dr Ranell Patrick) Please take all of your medications including blood pressure medications; all of your medications were refilled today  Please go to the LAB in the Basement (turn left off the elevator) for the tests to be done today You will be contacted by phone if any changes need to be made immediately.  Otherwise, you will receive a letter about your results with an explanation, but please check with MyChart first.  Please return in 6 months, or sooner if needed

## 2013-01-21 LAB — CBC WITH DIFFERENTIAL/PLATELET
Basophils Absolute: 0 10*3/uL (ref 0.0–0.1)
Basophils Relative: 0.7 % (ref 0.0–3.0)
Eosinophils Absolute: 0.1 10*3/uL (ref 0.0–0.7)
Eosinophils Relative: 1.1 % (ref 0.0–5.0)
HCT: 39.8 % (ref 39.0–52.0)
Hemoglobin: 12.4 g/dL — ABNORMAL LOW (ref 13.0–17.0)
Lymphocytes Relative: 39.2 % (ref 12.0–46.0)
Lymphs Abs: 2.1 10*3/uL (ref 0.7–4.0)
MCHC: 31.1 g/dL (ref 30.0–36.0)
MCV: 69.8 fl — ABNORMAL LOW (ref 78.0–100.0)
Monocytes Absolute: 0.4 10*3/uL (ref 0.1–1.0)
Monocytes Relative: 7.5 % (ref 3.0–12.0)
Neutro Abs: 2.8 10*3/uL (ref 1.4–7.7)
Neutrophils Relative %: 51.5 % (ref 43.0–77.0)
Platelets: 197 10*3/uL (ref 150.0–400.0)
RBC: 5.7 Mil/uL (ref 4.22–5.81)
RDW: 16.2 % — ABNORMAL HIGH (ref 11.5–14.6)
WBC: 5.4 10*3/uL (ref 4.5–10.5)

## 2013-01-21 NOTE — Assessment & Plan Note (Signed)
For med refill/restart, o/w stable overall by history and exam, recent data reviewed with pt, and pt to continue medical treatment as before,  to f/u any worsening symptoms or concerns BP Readings from Last 3 Encounters:  01/20/13 162/90  12/19/12 228/112  03/23/12 195/91

## 2013-01-21 NOTE — Assessment & Plan Note (Signed)
Ok for cialis prn,  to f/u any worsening symptoms or concerns  

## 2013-01-21 NOTE — Assessment & Plan Note (Signed)
Mod it seems, cont ultram but for ortho referral,  to f/u any worsening symptoms or concerns

## 2013-01-21 NOTE — Assessment & Plan Note (Signed)
stable overall by history and exam, recent data reviewed with pt, and pt to continue medical treatment as before,  to f/u any worsening symptoms or concerns Lab Results  Component Value Date   LDLCALC 150* 11/19/2011   Declines further tx such as statin now, for lab f/u today

## 2013-01-22 ENCOUNTER — Other Ambulatory Visit: Payer: Self-pay | Admitting: Internal Medicine

## 2013-01-22 ENCOUNTER — Encounter: Payer: Self-pay | Admitting: Internal Medicine

## 2013-01-22 MED ORDER — ATORVASTATIN CALCIUM 80 MG PO TABS: 80.0000 mg | ORAL_TABLET | Freq: Every day | ORAL | Status: DC

## 2013-08-05 ENCOUNTER — Other Ambulatory Visit: Payer: Self-pay | Admitting: Internal Medicine

## 2014-02-27 ENCOUNTER — Other Ambulatory Visit: Payer: Self-pay | Admitting: Internal Medicine

## 2014-06-26 ENCOUNTER — Encounter: Payer: Self-pay | Admitting: Internal Medicine

## 2014-06-26 ENCOUNTER — Ambulatory Visit (INDEPENDENT_AMBULATORY_CARE_PROVIDER_SITE_OTHER): Payer: Self-pay | Admitting: Internal Medicine

## 2014-06-26 VITALS — BP 148/88 | HR 62 | Temp 97.8°F | Ht 66.0 in | Wt 180.0 lb

## 2014-06-26 DIAGNOSIS — M79601 Pain in right arm: Secondary | ICD-10-CM

## 2014-06-26 DIAGNOSIS — N183 Chronic kidney disease, stage 3 unspecified: Secondary | ICD-10-CM

## 2014-06-26 DIAGNOSIS — N320 Bladder-neck obstruction: Secondary | ICD-10-CM | POA: Insufficient documentation

## 2014-06-26 DIAGNOSIS — E785 Hyperlipidemia, unspecified: Secondary | ICD-10-CM

## 2014-06-26 DIAGNOSIS — Z23 Encounter for immunization: Secondary | ICD-10-CM

## 2014-06-26 DIAGNOSIS — I10 Essential (primary) hypertension: Secondary | ICD-10-CM

## 2014-06-26 DIAGNOSIS — M79602 Pain in left arm: Secondary | ICD-10-CM

## 2014-06-26 MED ORDER — CLONIDINE HCL 0.1 MG PO TABS: 0.1000 mg | ORAL_TABLET | Freq: Two times a day (BID) | ORAL | Status: DC

## 2014-06-26 MED ORDER — AMLODIPINE BESYLATE 10 MG PO TABS: 10.0000 mg | ORAL_TABLET | Freq: Every day | ORAL | Status: DC

## 2014-06-26 MED ORDER — AMITRIPTYLINE HCL 50 MG PO TABS: 50.0000 mg | ORAL_TABLET | Freq: Every evening | ORAL | Status: DC | PRN

## 2014-06-26 MED ORDER — ATORVASTATIN CALCIUM 80 MG PO TABS: 80.0000 mg | ORAL_TABLET | Freq: Every day | ORAL | Status: DC

## 2014-06-26 MED ORDER — TADALAFIL 20 MG PO TABS: 20.0000 mg | ORAL_TABLET | Freq: Every day | ORAL | Status: DC | PRN

## 2014-06-26 MED ORDER — LISINOPRIL-HYDROCHLOROTHIAZIDE 20-12.5 MG PO TABS: 1.0000 | ORAL_TABLET | Freq: Every day | ORAL | Status: DC

## 2014-06-26 NOTE — Assessment & Plan Note (Signed)
Also for psa, as he is due, asympt,  to f/u any worsening symptoms or concerns

## 2014-06-26 NOTE — Assessment & Plan Note (Signed)
stable overall by history and exam, recent data reviewed with pt, and pt to continue medical treatment as before,  to f/u any worsening symptoms or concerns Lab Results  Component Value Date   CREATININE 1.2 01/20/2013

## 2014-06-26 NOTE — Assessment & Plan Note (Signed)
midl uncontrolled. meds to be renewed, pt encouraged regarding compliance, and seek help from a friend regarding taking of his med

## 2014-06-26 NOTE — Patient Instructions (Addendum)
You had the new Prevnar pneumonia shot today  You will be contacted regarding the referral for: Neck MRI (to see [facility] now)  OK to take tylenol for pain  Please continue all other medications as before, and refills have been done if requested.  Please have the pharmacy call with any other refills you may need.  Please continue your efforts at being more active, low cholesterol diet, and weight control.  You are otherwise up to date with prevention measures today.  Please keep your appointments with your specialists as you may have planned  Please go to the LAB in the Basement (turn left off the elevator) for the tests to be done today  You will be contacted by phone if any changes need to be made immediately.  Otherwise, you will receive a letter about your results with an explanation, but please check with MyChart first.  Please remember to sign up for MyChart if you have not done so, as this will be important to you in the future with finding out test results, communicating by private email, and scheduling acute appointments online when needed.  Please return in 6 months, or sooner if needed

## 2014-06-26 NOTE — Assessment & Plan Note (Signed)
Has ben uncontrolled, o/w stable overall by history and exam, recent data reviewed with pt, and pt to continue medical treatment as before,  to f/u any worsening symptoms or concerns Lab Results  Component Value Date   LDLCALC 150* 11/19/2011   For f/u lab

## 2014-06-26 NOTE — Progress Notes (Signed)
Pre visit review using our clinic review tool, if applicable. No additional management support is needed unless otherwise documented below in the visit note. 

## 2014-06-26 NOTE — Assessment & Plan Note (Signed)
?   Radicular pain vs other msk - exam benign for the latter today, for MRI c-spine ,  to f/u any worsening symptoms or concerns

## 2014-06-26 NOTE — Progress Notes (Signed)
Subjective:    Patient ID: [name], male    DOB: [birth year], 73 y.o.   MRN: [id number]  HPI  Here with bilat arm and occas sharp and burning neck pain, intermittent, mild to occas severe with radiation to shoulders and elbows occas, ongoing x 2-4 wks, nothing seems to make better or worse such as shoulder or arm movement, neck moveement, and not assoc with numbness, but has had occas decreased left arm weakness.  Has hx of left shoulder impingement treated , now improved, and seems different from current pain.  Pt denies chest pain, increased sob or doe, wheezing, orthopnea, PND, increased LE swelling, palpitations, dizziness or syncope.  Pt denies new neurological symptoms such as new headache, or facial or extremity weakness or numbness Also Admits to occas medical noncompliance, lives alone, manages his own meds but unable to say what they are for, and cannot recall exactly if takes them well Past Medical History  Diagnosis Date  . Hypertension     echocardiogram 6/10: Moderate LVH, EF 55-65%, mild, mild MR, MAC, mild LAE, PASP 32  . Dyslipidemia   . History of stroke   . CAD (coronary artery disease)     cath in 1/08: EF 60%, mild dilated Ao root, oD2 30%, LAD 20-30%.  . Paroxysmal atrial fibrillation   . Anemia, iron deficiency 08/15/2011  . History of pneumonia 08/15/2011  . History of MI (myocardial infarction) 08/15/2011  . 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 Stroke 10/2011  . Myocardial infarction   . Hyperlipidemia    Past Surgical History  Procedure Laterality Date  . Cardiac catheterization      reports that he has never smoked. He has never used smokeless tobacco. He reports that he does not drink alcohol or use illicit drugs. family history includes Aneurysm in his sister. There is no history of Colon cancer. No Known Allergies No current outpatient prescriptions on file prior to visit.   No current facility-administered medications on file prior to visit.      Review of Systems  Constitutional: Negative for unusual diaphoresis or other sweats  HENT: Negative for ringing in ear Eyes: Negative for double vision or worsening visual disturbance.  Respiratory: Negative for choking and stridor.   Gastrointestinal: Negative for vomiting or other signifcant bowel change Genitourinary: Negative for hematuria or decreased urine volume.  Musculoskeletal: Negative for other MSK pain or swelling Skin: Negative for color change and worsening wound.  Neurological: Negative for tremors and numbness other than noted  Psychiatric/Behavioral: Negative for decreased concentration or agitation other than above       Objective:   Physical Exam BP 148/88 mmHg  Pulse 62  Temp(Src) 97.8 F (36.6 C) (Oral)  Ht 5\' 6"  (1.676 m)  Wt 180 lb (81.647 kg)  BMI 29.07 kg/m2  SpO2 98% VS noted,  Constitutional: Pt appears well-developed, well-nourished.  HENT: Head: NCAT.  Right Ear: External ear normal.  Left Ear: External ear normal.  Eyes: . Pupils are equal, round, and reactive to light. Conjunctivae and EOM are normal Neck: Normal range of motion. Neck supple.  Cardiovascular: Normal rate and regular rhythm.   Pulmonary/Chest: Effort normal and breath sounds normal.  Abd:  Soft, NT, ND, + BS Neurological: Pt is alert. Somewhat confused/memory/cognitive issue noted , motor intact to UE, has bilat trapezoid tender, and somewhat decreased sens to LT distal LUE, cn 2-12 intact Skin: Skin is warm. No rash Psychiatric: Pt behavior is normal. No agitation.  Assessment & Plan:

## 2014-06-27 ENCOUNTER — Other Ambulatory Visit: Payer: Self-pay | Admitting: Internal Medicine

## 2014-06-27 ENCOUNTER — Other Ambulatory Visit (INDEPENDENT_AMBULATORY_CARE_PROVIDER_SITE_OTHER): Payer: Self-pay

## 2014-06-27 ENCOUNTER — Encounter: Payer: Self-pay | Admitting: Internal Medicine

## 2014-06-27 DIAGNOSIS — N320 Bladder-neck obstruction: Secondary | ICD-10-CM

## 2014-06-27 DIAGNOSIS — E785 Hyperlipidemia, unspecified: Secondary | ICD-10-CM

## 2014-06-27 LAB — CBC WITH DIFFERENTIAL/PLATELET
BASOS ABS: 0 10*3/uL (ref 0.0–0.1)
Basophils Relative: 0.4 % (ref 0.0–3.0)
Eosinophils Absolute: 0.1 10*3/uL (ref 0.0–0.7)
Eosinophils Relative: 1.4 % (ref 0.0–5.0)
HEMATOCRIT: 39.4 % (ref 39.0–52.0)
Hemoglobin: 12.2 g/dL — ABNORMAL LOW (ref 13.0–17.0)
Lymphocytes Relative: 24.9 % (ref 12.0–46.0)
Lymphs Abs: 1.2 10*3/uL (ref 0.7–4.0)
MCHC: 30.9 g/dL (ref 30.0–36.0)
MCV: 69.4 fl — ABNORMAL LOW (ref 78.0–100.0)
Monocytes Absolute: 0.3 10*3/uL (ref 0.1–1.0)
Monocytes Relative: 6.7 % (ref 3.0–12.0)
Neutro Abs: 3.1 10*3/uL (ref 1.4–7.7)
Neutrophils Relative %: 66.6 % (ref 43.0–77.0)
PLATELETS: 177 10*3/uL (ref 150.0–400.0)
RBC: 5.68 Mil/uL (ref 4.22–5.81)
RDW: 16 % — AB (ref 11.5–15.5)
WBC: 4.7 10*3/uL (ref 4.0–10.5)

## 2014-06-27 LAB — URINALYSIS, ROUTINE W REFLEX MICROSCOPIC
Bilirubin Urine: NEGATIVE
KETONES UR: NEGATIVE
LEUKOCYTES UA: NEGATIVE
Nitrite: NEGATIVE
SPECIFIC GRAVITY, URINE: 1.025 (ref 1.000–1.030)
Total Protein, Urine: NEGATIVE
UROBILINOGEN UA: 0.2 (ref 0.0–1.0)
Urine Glucose: NEGATIVE
pH: 6 (ref 5.0–8.0)

## 2014-06-27 LAB — LIPID PANEL
CHOL/HDL RATIO: 8
Cholesterol: 294 mg/dL — ABNORMAL HIGH (ref 0–200)
HDL: 36.6 mg/dL — ABNORMAL LOW (ref >39.00)
LDL CALC: 233 mg/dL — AB (ref 0–99)
NonHDL: 257.4
Triglycerides: 122 mg/dL (ref 0.0–149.0)
VLDL: 24.4 mg/dL (ref 0.0–40.0)

## 2014-06-27 LAB — BASIC METABOLIC PANEL
BUN: 18 mg/dL (ref 6–23)
CO2: 27 mEq/L (ref 19–32)
Calcium: 8.8 mg/dL (ref 8.4–10.5)
Chloride: 104 mEq/L (ref 96–112)
Creatinine, Ser: 1.2 mg/dL (ref 0.4–1.5)
GFR: 77.18 mL/min (ref >60.00)
Glucose, Bld: 97 mg/dL (ref 70–99)
POTASSIUM: 4.3 meq/L (ref 3.5–5.1)
SODIUM: 139 meq/L (ref 135–145)

## 2014-06-27 LAB — HEPATIC FUNCTION PANEL
ALBUMIN: 3.6 g/dL (ref 3.5–5.2)
ALK PHOS: 72 U/L (ref 39–117)
ALT: 15 U/L (ref 0–53)
AST: 17 U/L (ref 0–37)
Bilirubin, Direct: 0 mg/dL (ref 0.0–0.3)
Total Bilirubin: 0.4 mg/dL (ref 0.2–1.2)
Total Protein: 7.2 g/dL (ref 6.0–8.3)

## 2014-06-27 LAB — PSA: PSA: 0.94 ng/mL (ref 0.10–4.00)

## 2014-06-27 LAB — TSH: TSH: 0.97 u[IU]/mL (ref 0.35–4.50)

## 2014-06-27 MED ORDER — ATORVASTATIN CALCIUM 80 MG PO TABS: 80.0000 mg | ORAL_TABLET | Freq: Every day | ORAL | Status: DC

## 2014-07-19 ENCOUNTER — Inpatient Hospital Stay: Admission: RE | Admit: 2014-07-19 | Payer: Medicare Other | Source: Ambulatory Visit

## 2015-01-21 ENCOUNTER — Emergency Department (HOSPITAL_COMMUNITY)
Admission: EM | Admit: 2015-01-21 | Discharge: 2015-01-21 | Disposition: A | Discharge status: Home / Self Care | Payer: Medicare Other | Attending: Emergency Medicine | Admitting: Emergency Medicine

## 2015-01-21 ENCOUNTER — Encounter (HOSPITAL_COMMUNITY): Payer: Self-pay

## 2015-01-21 DIAGNOSIS — Z8673 Personal history of transient ischemic attack (TIA), and cerebral infarction without residual deficits: Secondary | ICD-10-CM | POA: Insufficient documentation

## 2015-01-21 DIAGNOSIS — I10 Essential (primary) hypertension: Secondary | ICD-10-CM | POA: Insufficient documentation

## 2015-01-21 DIAGNOSIS — Z862 Personal history of diseases of the blood and blood-forming organs and certain disorders involving the immune mechanism: Secondary | ICD-10-CM | POA: Insufficient documentation

## 2015-01-21 DIAGNOSIS — R1030 Lower abdominal pain, unspecified: Secondary | ICD-10-CM | POA: Insufficient documentation

## 2015-01-21 DIAGNOSIS — E785 Hyperlipidemia, unspecified: Secondary | ICD-10-CM | POA: Insufficient documentation

## 2015-01-21 DIAGNOSIS — R410 Disorientation, unspecified: Secondary | ICD-10-CM | POA: Insufficient documentation

## 2015-01-21 DIAGNOSIS — I2510 Atherosclerotic heart disease of native coronary artery without angina pectoris: Secondary | ICD-10-CM | POA: Insufficient documentation

## 2015-01-21 DIAGNOSIS — Z8701 Personal history of pneumonia (recurrent): Secondary | ICD-10-CM | POA: Insufficient documentation

## 2015-01-21 DIAGNOSIS — I480 Paroxysmal atrial fibrillation: Secondary | ICD-10-CM | POA: Insufficient documentation

## 2015-01-21 DIAGNOSIS — R339 Retention of urine, unspecified: Secondary | ICD-10-CM | POA: Insufficient documentation

## 2015-01-21 DIAGNOSIS — Z9889 Other specified postprocedural states: Secondary | ICD-10-CM | POA: Insufficient documentation

## 2015-01-21 DIAGNOSIS — Z79899 Other long term (current) drug therapy: Secondary | ICD-10-CM | POA: Insufficient documentation

## 2015-01-21 DIAGNOSIS — I252 Old myocardial infarction: Secondary | ICD-10-CM | POA: Insufficient documentation

## 2015-01-21 LAB — CBC WITH DIFFERENTIAL/PLATELET
BASOS ABS: 0 10*3/uL (ref 0.0–0.1)
Basophils Relative: 0 % (ref 0–1)
EOS ABS: 0.1 10*3/uL (ref 0.0–0.7)
Eosinophils Relative: 1 % (ref 0–5)
HCT: 36.5 % — ABNORMAL LOW (ref 39.0–52.0)
Hemoglobin: 11.6 g/dL — ABNORMAL LOW (ref 13.0–17.0)
LYMPHS ABS: 1.4 10*3/uL (ref 0.7–4.0)
Lymphocytes Relative: 21 % (ref 12–46)
MCH: 21.5 pg — ABNORMAL LOW (ref 26.0–34.0)
MCHC: 31.8 g/dL (ref 30.0–36.0)
MCV: 67.7 fL — ABNORMAL LOW (ref 78.0–100.0)
MONOS PCT: 6 % (ref 3–12)
Monocytes Absolute: 0.4 10*3/uL (ref 0.1–1.0)
NEUTROS ABS: 4.9 10*3/uL (ref 1.7–7.7)
Neutrophils Relative %: 72 % (ref 43–77)
PLATELETS: 170 10*3/uL (ref 150–400)
RBC: 5.39 MIL/uL (ref 4.22–5.81)
RDW: 16 % — AB (ref 11.5–15.5)
WBC: 6.8 10*3/uL (ref 4.0–10.5)

## 2015-01-21 LAB — I-STAT CHEM 8, ED
BUN: 18 mg/dL (ref 6–20)
CALCIUM ION: 1.17 mmol/L (ref 1.13–1.30)
Chloride: 104 mmol/L (ref 101–111)
Creatinine, Ser: 1.3 mg/dL — ABNORMAL HIGH (ref 0.61–1.24)
Glucose, Bld: 149 mg/dL — ABNORMAL HIGH (ref 65–99)
HEMATOCRIT: 40 % (ref 39.0–52.0)
Hemoglobin: 13.6 g/dL (ref 13.0–17.0)
Potassium: 3.5 mmol/L (ref 3.5–5.1)
SODIUM: 141 mmol/L (ref 135–145)
TCO2: 23 mmol/L (ref 0–100)

## 2015-01-21 LAB — URINALYSIS, ROUTINE W REFLEX MICROSCOPIC
Bilirubin Urine: NEGATIVE
Glucose, UA: NEGATIVE mg/dL
KETONES UR: 15 mg/dL — AB
NITRITE: NEGATIVE
PH: 5 (ref 5.0–8.0)
Protein, ur: >300 mg/dL — AB
SPECIFIC GRAVITY, URINE: 1.021 (ref 1.005–1.030)
Urobilinogen, UA: 1 mg/dL (ref 0.0–1.0)

## 2015-01-21 LAB — URINE MICROSCOPIC-ADD ON

## 2015-01-21 MED ORDER — LISINOPRIL-HYDROCHLOROTHIAZIDE 20-12.5 MG PO TABS: 1.0000 | ORAL_TABLET | Freq: Every day | ORAL | Status: DC

## 2015-01-21 MED ORDER — AMLODIPINE BESYLATE 10 MG PO TABS: 10.0000 mg | ORAL_TABLET | Freq: Every day | ORAL | Status: DC

## 2015-01-21 NOTE — ED Notes (Signed)
EDP made aware of pt's BP.  Okay'd pt to go home.

## 2015-01-21 NOTE — ED Notes (Signed)
Pt requesting to see MD.  Dr. Tessa Lerner made aware

## 2015-01-21 NOTE — ED Notes (Signed)
Mariann Laster, CSW called for follow-up on pt status.

## 2015-01-21 NOTE — ED Notes (Signed)
Menu provided to pt so lunch order could be placed.

## 2015-01-21 NOTE — ED Notes (Signed)
[name], pt caregiver number.  Contact with changes.

## 2015-01-21 NOTE — ED Notes (Signed)
Zavitz MD made aware of pt blood pressure.  No further orders at this time.

## 2015-01-21 NOTE — ED Notes (Signed)
Pt visitor reports concern with patient being released home.  Family friend sts "His living situation is just not safe.  He has been confused for a long time but its getting worse.  If he goes home alone he won't eat or drink like he's supposed to and he just is confused.  He doesn't even know when he is hurting."  RN made Zavitz MD aware who will go see pt.  Case Management to be made aware of pt.

## 2015-01-21 NOTE — ED Provider Notes (Signed)
CSN: [id number]     Arrival date & time 01/21/15  1015 History   First MD Initiated Contact with Patient 01/21/15 1022     Chief Complaint  Patient presents with  . Urinary Retention     (Consider location/radiation/quality/duration/timing/severity/associated sxs/prior Treatment) The history is provided by the patient.    [name] is a 74 yo M PMH HTN, HLD, CVA, CKD III, MI and Bladder neck obstruction that is p/w urinary retention. He had dyuria, suprapubic pain, and urinary hesitancy last night that was out of the normal for him so he [facility] ED. He had an indwelling catheter was placed and 850 cc was taken off. He was discharged home and advised to follow up with a Urologist. He presents today with no changes since last night and was wanting to be seen by urologist. He reports a distant hx of seeing a urologist for urinary problems but not site the specific reason. 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 Denies any fever, chills, abdominal pain, night sweats, or back pain.    Past Medical History  Diagnosis Date  . Hypertension     echocardiogram 6/10: Moderate LVH, EF 55-65%, mild, mild MR, MAC, mild LAE, PASP 32  . Dyslipidemia   . History of stroke   . CAD (coronary artery disease)     cath in 1/08: EF 60%, mild dilated Ao root, oD2 30%, LAD 20-30%.  . Paroxysmal atrial fibrillation   . Anemia, iron deficiency 08/15/2011  . History of pneumonia 08/15/2011  . History of MI (myocardial infarction) 08/15/2011  . 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 Stroke 10/2011  . Myocardial infarction   . Hyperlipidemia    Past Surgical History  Procedure Laterality Date  . Cardiac catheterization     Family History  Problem Relation Age of Onset  . Colon cancer Neg Hx   . Aneurysm Sister    History  Substance Use Topics  . Smoking status: Never Smoker   . Smokeless tobacco: Never Used  . Alcohol Use: No    Review of Systems  Constitutional:  Negative for fever and chills.  Respiratory: Negative for shortness of breath.   Cardiovascular: Negative for chest pain.  Gastrointestinal: Negative for vomiting, abdominal pain, diarrhea and constipation.  Genitourinary: Positive for dysuria.  Skin: Negative for rash.      Allergies  Review of patient's allergies indicates no known allergies.  Home Medications   Prior to Admission medications   Medication Sig Start Date End Date Taking? Authorizing Provider  amitriptyline (ELAVIL) 50 MG tablet Take 1 tablet (50 mg total) by mouth at bedtime as needed for sleep. Patient not taking: Reported on 01/21/2015 06/26/14 06/26/15  Biagio Borg, MD  amLODipine (NORVASC) 10 MG tablet Take 1 tablet (10 mg total) by mouth daily. 01/21/15   Rosemarie Ax, MD  atorvastatin (LIPITOR) 80 MG tablet Take 1 tablet (80 mg total) by mouth daily. Patient not taking: Reported on 01/21/2015 06/27/14   Biagio Borg, MD  cloNIDine (CATAPRES) 0.1 MG tablet Take 1 tablet (0.1 mg total) by mouth 2 (two) times daily. Patient not taking: Reported on 01/21/2015 06/26/14   Biagio Borg, MD  lisinopril-hydrochlorothiazide (PRINZIDE,ZESTORETIC) 20-12.5 MG per tablet Take 1 tablet by mouth daily. 01/21/15   Rosemarie Ax, MD  tadalafil (CIALIS) 20 MG tablet Take 1 tablet (20 mg total) by mouth daily as needed for erectile dysfunction. Patient not taking: Reported on 01/21/2015 06/26/14  Biagio Borg, MD   BP 195/103 mmHg  Pulse 81  Temp(Src) 97.5 F (36.4 C) (Oral)  Resp 18  Ht 5\' 6"  (1.676 m)  Wt 189 lb (85.73 kg)  BMI 30.52 kg/m2  SpO2 99% Physical Exam  Constitutional: He is oriented to person, place, and time. He appears well-developed and well-nourished.  HENT:  Head: Normocephalic and atraumatic.  Eyes: Conjunctivae and EOM are normal.  Neck: Normal range of motion.  Cardiovascular: Normal rate, regular rhythm, normal heart sounds and intact distal pulses.   No murmur heard. Pulmonary/Chest: Effort  normal and breath sounds normal. He has no wheezes. He has no rales.  Abdominal: Soft. Bowel sounds are normal. There is tenderness in the suprapubic area. There is no rebound and no CVA tenderness.    Neurological: He is alert and oriented to person, place, and time.  Skin: Skin is warm. No rash noted.    ED Course  Procedures (including critical care time) Labs Review Labs Reviewed  URINALYSIS, ROUTINE W REFLEX MICROSCOPIC (NOT AT [facility]) - Abnormal; Notable for the following:    Color, Urine AMBER (*)    APPearance CLOUDY (*)    Hgb urine dipstick LARGE (*)    Ketones, ur 15 (*)    Protein, ur >300 (*)    Leukocytes, UA SMALL (*)    All other components within normal limits  CBC WITH DIFFERENTIAL/PLATELET - Abnormal; Notable for the following:    Hemoglobin 11.6 (*)    HCT 36.5 (*)    MCV 67.7 (*)    MCH 21.5 (*)    RDW 16.0 (*)    All other components within normal limits  URINE MICROSCOPIC-ADD ON - Abnormal; Notable for the following:    Squamous Epithelial / LPF FEW (*)    Bacteria, UA FEW (*)    All other components within normal limits  I-STAT CHEM 8, ED - Abnormal; Notable for the following:    Creatinine, Ser 1.30 (*)    Glucose, Bld 149 (*)    All other components within normal limits    Imaging Review No results found.   EKG Interpretation None        MDM   Final diagnoses:  Urinary retention  Intermittent confusion    [name] is a 74 yo M that is presenting with urinary rentention that has an indwelling that was placed at [facility] yesterday. He wants a folloup with urology.  I called [facility] and they advised to have him call the clinic to schedule an appt. He was seen there greater than 3 years ago.  A triage nurse will be able to determine the ability to schedule him. Refilled his norvasc 10 mg daily and zestoretic 20/12.5 mg daily.    Rosemarie Ax, MD PGY-2, [place] [facility] 01/21/2015, 2:06  PM    Rosemarie Ax, MD 01/21/15 1407  Elnora Morrison, MD 01/21/15 [phone]

## 2015-01-21 NOTE — ED Notes (Signed)
Pt repositioned and assisted with meal.  Case management at bedside and speaking with pt.

## 2015-01-21 NOTE — Care Management Note (Signed)
Case Management Note  Patient Details  Name: [name] MRN: [id number] Date of Birth: Jun 15, 1941  Subjective/Objective:        74 yo M PMH HTN, HLD, CVA, CKD III, MI and Bladder neck obstruction that is p/w urinary retention. //Lives at home alone.            Action/Plan:  Patient will need close follow-up outpatient, home health arranged and discussed with case manager. Follow-up with urology. BP script written.//Access for [facility] services.   Expected Discharge Date:       01/21/15           Expected Discharge Plan:  [place]  In-House Referral:  NA  Discharge planning Services  CM Consult  Post Acute Care Choice:  NA Choice offered to:  Patient  DME Arranged:    DME Agency:     HH Arranged:  RN, NA HH Agency:  [place]  Status of Service:  Completed, signed off  Medicare Important Message Given:    Date Medicare IM Given:    Medicare IM give by:    Date Additional Medicare IM Given:    Additional Medicare Important Message give by:     If discussed at [place] of Stay Meetings, dates discussed:    Additional Comments: Spoke with pt. regarding discharge planning. CM offered pt. list of home health agencies. Pt. chose [facility] to render services. Christa See, RN of [facility] notified. No DME needs identified at this time.   Fuller Mandril, RN 01/21/2015, 3:20 PM

## 2015-01-21 NOTE — Discharge Instructions (Signed)
If you were given medicines take as directed.  If you are on coumadin or contraceptives realize their levels and effectiveness is altered by many different medicines.  If you have any reaction (rash, tongues swelling, other) to the medicines stop taking and see a physician.    If your blood pressure was elevated in the ER make sure you follow up for management with a primary doctor or return for chest pain, shortness of breath or stroke symptoms.  Please follow up as directed and return to the ER or see a physician for new or worsening symptoms.  Thank you. Filed Vitals:   01/21/15 1145 01/21/15 1230 01/21/15 1245 01/21/15 1315  BP: 177/88 180/95 150/85 185/84  Pulse: 72 74 72 72  Temp:      TempSrc:      Resp:      Height:      Weight:      SpO2: 97% 98% 96% 96%

## 2015-01-21 NOTE — ED Notes (Signed)
Went in to tell pt about ability to go home.  Case management has set up home health and follow up appointments for pt.  Friend at bedside requesting additional information about home health visits and our follow up with care management.  This RN spoke to care management who will come down and speak to the patient once again.

## 2015-01-21 NOTE — ED Notes (Signed)
Meal tray ordered for pt  

## 2015-01-21 NOTE — ED Notes (Signed)
Pt was taken to [facility] last night for urinary retention.  Pt had indwelling foley placed which drained 850cc of urine, per wife.  Pt was told to follow up today.  Family brought pt in to be revaluated.

## 2015-01-21 NOTE — ED Notes (Signed)
Pt provided with catheter bag, per request of pt and friend at bedside.  All additional questions answered by Care Management and this RN.  Pt verbalized understanding of additional orders, prescriptions etc.  Pt able to dress independently with RN in room to begin.

## 2015-01-22 NOTE — Consult Note (Signed)
   [facility] CM Inpatient Consult   01/22/2015  [name] 1941-03-01 [id number] Referral received for possible community care management follow up and services. After checking and cross checking unfortunately, this patient is currently not eligible and not in the designated assignment for [place] Management services.  Will update ED RNCM of outcome of referral.  For questions, please contact: Natividad Brood, RN BSN [facility] Liaison  [phone] business mobile phone

## 2015-01-23 ENCOUNTER — Ambulatory Visit: Payer: Medicare Other | Admitting: Internal Medicine

## 2015-02-07 ENCOUNTER — Ambulatory Visit: Payer: Medicare Other | Admitting: Internal Medicine

## 2015-06-19 ENCOUNTER — Observation Stay (HOSPITAL_COMMUNITY)
Admission: EM | Admit: 2015-06-19 | Discharge: 2015-06-21 | Disposition: A | Discharge status: Home / Self Care | Payer: Medicare Other | Attending: Internal Medicine | Admitting: Internal Medicine

## 2015-06-19 ENCOUNTER — Encounter (HOSPITAL_COMMUNITY): Payer: Self-pay | Admitting: *Deleted

## 2015-06-19 ENCOUNTER — Observation Stay (HOSPITAL_BASED_OUTPATIENT_CLINIC_OR_DEPARTMENT_OTHER): Payer: Medicare Other

## 2015-06-19 ENCOUNTER — Emergency Department (HOSPITAL_COMMUNITY): Payer: Medicare Other

## 2015-06-19 DIAGNOSIS — I10 Essential (primary) hypertension: Secondary | ICD-10-CM | POA: Diagnosis not present

## 2015-06-19 DIAGNOSIS — Z79899 Other long term (current) drug therapy: Secondary | ICD-10-CM | POA: Insufficient documentation

## 2015-06-19 DIAGNOSIS — D509 Iron deficiency anemia, unspecified: Secondary | ICD-10-CM | POA: Diagnosis not present

## 2015-06-19 DIAGNOSIS — R0600 Dyspnea, unspecified: Secondary | ICD-10-CM | POA: Diagnosis not present

## 2015-06-19 DIAGNOSIS — I160 Hypertensive urgency: Secondary | ICD-10-CM | POA: Diagnosis not present

## 2015-06-19 DIAGNOSIS — R079 Chest pain, unspecified: Principal | ICD-10-CM | POA: Insufficient documentation

## 2015-06-19 DIAGNOSIS — N400 Enlarged prostate without lower urinary tract symptoms: Secondary | ICD-10-CM | POA: Diagnosis present

## 2015-06-19 DIAGNOSIS — R0602 Shortness of breath: Secondary | ICD-10-CM | POA: Insufficient documentation

## 2015-06-19 DIAGNOSIS — I4891 Unspecified atrial fibrillation: Secondary | ICD-10-CM | POA: Diagnosis not present

## 2015-06-19 DIAGNOSIS — Z8673 Personal history of transient ischemic attack (TIA), and cerebral infarction without residual deficits: Secondary | ICD-10-CM | POA: Insufficient documentation

## 2015-06-19 DIAGNOSIS — N183 Chronic kidney disease, stage 3 unspecified: Secondary | ICD-10-CM | POA: Diagnosis present

## 2015-06-19 DIAGNOSIS — E785 Hyperlipidemia, unspecified: Secondary | ICD-10-CM | POA: Insufficient documentation

## 2015-06-19 DIAGNOSIS — R062 Wheezing: Secondary | ICD-10-CM | POA: Diagnosis not present

## 2015-06-19 DIAGNOSIS — I2510 Atherosclerotic heart disease of native coronary artery without angina pectoris: Secondary | ICD-10-CM | POA: Diagnosis present

## 2015-06-19 DIAGNOSIS — I252 Old myocardial infarction: Secondary | ICD-10-CM | POA: Insufficient documentation

## 2015-06-19 DIAGNOSIS — I480 Paroxysmal atrial fibrillation: Secondary | ICD-10-CM | POA: Insufficient documentation

## 2015-06-19 DIAGNOSIS — R0609 Other forms of dyspnea: Secondary | ICD-10-CM | POA: Diagnosis not present

## 2015-06-19 HISTORY — DX: Reserved for inherently not codable concepts without codable children: IMO0001

## 2015-06-19 LAB — CBC WITH DIFFERENTIAL/PLATELET
BASOS PCT: 0 %
Basophils Absolute: 0 10*3/uL (ref 0.0–0.1)
EOS ABS: 0.2 10*3/uL (ref 0.0–0.7)
EOS PCT: 3 %
HCT: 37.3 % — ABNORMAL LOW (ref 39.0–52.0)
Hemoglobin: 11.9 g/dL — ABNORMAL LOW (ref 13.0–17.0)
Lymphocytes Relative: 22 %
Lymphs Abs: 1.4 10*3/uL (ref 0.7–4.0)
MCH: 21.7 pg — AB (ref 26.0–34.0)
MCHC: 31.9 g/dL (ref 30.0–36.0)
MCV: 67.9 fL — ABNORMAL LOW (ref 78.0–100.0)
MONO ABS: 0.5 10*3/uL (ref 0.1–1.0)
Monocytes Relative: 8 %
Neutro Abs: 4.2 10*3/uL (ref 1.7–7.7)
Neutrophils Relative %: 67 %
PLATELETS: 157 10*3/uL (ref 150–400)
RBC: 5.49 MIL/uL (ref 4.22–5.81)
RDW: 15.7 % — ABNORMAL HIGH (ref 11.5–15.5)
WBC: 6.3 10*3/uL (ref 4.0–10.5)

## 2015-06-19 LAB — LIPID PANEL
CHOL/HDL RATIO: 5.4 ratio
CHOLESTEROL: 284 mg/dL — AB (ref 0–200)
HDL: 53 mg/dL (ref >40)
LDL Cholesterol: 219 mg/dL — ABNORMAL HIGH (ref 0–99)
TRIGLYCERIDES: 59 mg/dL (ref <150)
VLDL: 12 mg/dL (ref 0–40)

## 2015-06-19 LAB — BASIC METABOLIC PANEL
Anion gap: 8 (ref 5–15)
BUN: 15 mg/dL (ref 6–20)
CALCIUM: 8.8 mg/dL — AB (ref 8.9–10.3)
CO2: 22 mmol/L (ref 22–32)
Chloride: 107 mmol/L (ref 101–111)
Creatinine, Ser: 1.23 mg/dL (ref 0.61–1.24)
GFR calc Af Amer: >60 mL/min (ref >60)
GFR calc non Af Amer: 56 mL/min — ABNORMAL LOW (ref >60)
Glucose, Bld: 92 mg/dL (ref 65–99)
Potassium: 4 mmol/L (ref 3.5–5.1)
Sodium: 137 mmol/L (ref 135–145)

## 2015-06-19 LAB — PROTIME-INR
INR: 1.16 (ref 0.00–1.49)
Prothrombin Time: 15 seconds (ref 11.6–15.2)

## 2015-06-19 LAB — I-STAT TROPONIN, ED: TROPONIN I, POC: 0.05 ng/mL (ref 0.00–0.08)

## 2015-06-19 LAB — TROPONIN I
Troponin I: 0.03 ng/mL (ref <0.031)
Troponin I: 0.08 ng/mL — ABNORMAL HIGH (ref <0.031)

## 2015-06-19 LAB — BRAIN NATRIURETIC PEPTIDE: B NATRIURETIC PEPTIDE 5: 301.8 pg/mL — AB (ref 0.0–100.0)

## 2015-06-19 MED ORDER — FUROSEMIDE 10 MG/ML IJ SOLN
40.0000 mg | Freq: Once | INTRAMUSCULAR | Status: AC
  Administered 2015-06-19: 40 mg via INTRAVENOUS
  Filled 2015-06-19: qty 4

## 2015-06-19 MED ORDER — ASPIRIN 81 MG PO CHEW
324.0000 mg | CHEWABLE_TABLET | Freq: Once | ORAL | Status: AC
  Administered 2015-06-19: 324 mg via ORAL
  Filled 2015-06-19: qty 4

## 2015-06-19 MED ORDER — SENNOSIDES-DOCUSATE SODIUM 8.6-50 MG PO TABS: 1.0000 | ORAL_TABLET | Freq: Every evening | ORAL | Status: DC | PRN

## 2015-06-19 MED ORDER — LISINOPRIL-HYDROCHLOROTHIAZIDE 20-12.5 MG PO TABS: 1.0000 | ORAL_TABLET | Freq: Every day | ORAL | Status: DC

## 2015-06-19 MED ORDER — ALBUTEROL SULFATE (2.5 MG/3ML) 0.083% IN NEBU
5.0000 mg | INHALATION_SOLUTION | Freq: Once | RESPIRATORY_TRACT | Status: AC
  Administered 2015-06-19: 5 mg via RESPIRATORY_TRACT
  Filled 2015-06-19: qty 6

## 2015-06-19 MED ORDER — PREDNISONE 20 MG PO TABS
40.0000 mg | ORAL_TABLET | Freq: Every day | ORAL | Status: DC
  Administered 2015-06-19 – 2015-06-20 (×2): 40 mg via ORAL
  Filled 2015-06-19 (×2): qty 2

## 2015-06-19 MED ORDER — HYDROCHLOROTHIAZIDE 12.5 MG PO CAPS
12.5000 mg | ORAL_CAPSULE | Freq: Every day | ORAL | Status: DC
  Administered 2015-06-19: 12.5 mg via ORAL
  Filled 2015-06-19: qty 1

## 2015-06-19 MED ORDER — AMLODIPINE BESYLATE 10 MG PO TABS
10.0000 mg | ORAL_TABLET | Freq: Every day | ORAL | Status: DC
  Administered 2015-06-19 – 2015-06-21 (×3): 10 mg via ORAL
  Filled 2015-06-19 (×2): qty 1
  Filled 2015-06-19: qty 2

## 2015-06-19 MED ORDER — ENOXAPARIN SODIUM 30 MG/0.3ML ~~LOC~~ SOLN
30.0000 mg | SUBCUTANEOUS | Status: DC
  Administered 2015-06-19: 30 mg via SUBCUTANEOUS
  Filled 2015-06-19: qty 0.3

## 2015-06-19 MED ORDER — ALUM & MAG HYDROXIDE-SIMETH 200-200-20 MG/5ML PO SUSP
30.0000 mL | Freq: Four times a day (QID) | ORAL | Status: DC | PRN
Start: 2015-06-19 — End: 2015-06-21

## 2015-06-19 MED ORDER — CLONIDINE HCL 0.1 MG PO TABS
0.1000 mg | ORAL_TABLET | Freq: Two times a day (BID) | ORAL | Status: DC
  Administered 2015-06-19 (×2): 0.1 mg via ORAL
  Filled 2015-06-19 (×2): qty 1

## 2015-06-19 MED ORDER — GUAIFENESIN-DM 100-10 MG/5ML PO SYRP: 5.0000 mL | ORAL_SOLUTION | ORAL | Status: DC | PRN

## 2015-06-19 MED ORDER — ATORVASTATIN CALCIUM 80 MG PO TABS
80.0000 mg | ORAL_TABLET | Freq: Every day | ORAL | Status: DC
  Administered 2015-06-20 – 2015-06-21 (×2): 80 mg via ORAL
  Filled 2015-06-19 (×2): qty 1

## 2015-06-19 MED ORDER — ALBUTEROL SULFATE (2.5 MG/3ML) 0.083% IN NEBU: 2.5000 mg | INHALATION_SOLUTION | RESPIRATORY_TRACT | Status: DC | PRN

## 2015-06-19 MED ORDER — ONDANSETRON HCL 4 MG PO TABS: 4.0000 mg | ORAL_TABLET | Freq: Four times a day (QID) | ORAL | Status: DC | PRN

## 2015-06-19 MED ORDER — HYDROCODONE-ACETAMINOPHEN 5-325 MG PO TABS: 1.0000 | ORAL_TABLET | ORAL | Status: DC | PRN

## 2015-06-19 MED ORDER — ALBUTEROL SULFATE (2.5 MG/3ML) 0.083% IN NEBU
2.5000 mg | INHALATION_SOLUTION | Freq: Four times a day (QID) | RESPIRATORY_TRACT | Status: DC
  Administered 2015-06-19 – 2015-06-20 (×4): 2.5 mg via RESPIRATORY_TRACT
  Filled 2015-06-19 (×4): qty 3

## 2015-06-19 MED ORDER — LISINOPRIL 20 MG PO TABS
20.0000 mg | ORAL_TABLET | Freq: Every day | ORAL | Status: DC
  Administered 2015-06-19: 20 mg via ORAL
  Filled 2015-06-19: qty 1

## 2015-06-19 MED ORDER — SODIUM CHLORIDE 0.9 % IJ SOLN
3.0000 mL | Freq: Two times a day (BID) | INTRAMUSCULAR | Status: DC
  Administered 2015-06-19 – 2015-06-21 (×5): 3 mL via INTRAVENOUS

## 2015-06-19 MED ORDER — ACETAMINOPHEN 325 MG PO TABS: 650.0000 mg | ORAL_TABLET | Freq: Four times a day (QID) | ORAL | Status: DC | PRN

## 2015-06-19 MED ORDER — ACETAMINOPHEN 650 MG RE SUPP: 650.0000 mg | Freq: Four times a day (QID) | RECTAL | Status: DC | PRN

## 2015-06-19 MED ORDER — ONDANSETRON HCL 4 MG/2ML IJ SOLN: 4.0000 mg | Freq: Four times a day (QID) | INTRAMUSCULAR | Status: DC | PRN

## 2015-06-19 NOTE — ED Notes (Addendum)
Admitting at bedside 

## 2015-06-19 NOTE — ED Provider Notes (Addendum)
CSN: [id number]     Arrival date & time 06/19/15  [id number] History   First MD Initiated Contact with Patient 06/19/15 [phone]     Chief Complaint  Patient presents with  . Cough  . Shortness of Breath     (Consider location/radiation/quality/duration/timing/severity/associated sxs/prior Treatment) HPI Comments: Patient is a 74 year old male with a history of hypertension, MI, dyslipidemia, coronary artery disease and stroke who presents today with 2 weeks of worsening shortness of breath, wheezing and cough. Patient states that he is not taking his blood pressure medication over 6 months because he thought he had been on it for long enough and decided to stop taking it. For the last 2 weeks his cough has progressively gotten worse it seems to be worse with activity and lying down at night. Only sputum is white frothy sputum. He denies any recent weight gain, leg swelling or abdominal swelling. Patient does have chest discomfort which is usually related to lying down or exertion. He currently denies any chest pain at this time. He is unable to recall if this chest discomfort feels anything like his prior heart attack.  Patient currently is taking no medications at this time. He denies any tobacco use or history of lung problems.  Patient is a 74 y.o. male presenting with cough and shortness of breath. The history is provided by the patient.  Cough Associated symptoms: shortness of breath   Shortness of Breath Associated symptoms: cough     Past Medical History  Diagnosis Date  . Hypertension     echocardiogram 6/10: Moderate LVH, EF 55-65%, mild, mild MR, MAC, mild LAE, PASP 32  . Dyslipidemia   . History of stroke   . CAD (coronary artery disease)     cath in 1/08: EF 60%, mild dilated Ao root, oD2 30%, LAD 20-30%.  . Paroxysmal atrial fibrillation ([place])   . Anemia, iron deficiency 08/15/2011  . History of pneumonia 08/15/2011  . History of MI (myocardial infarction) 08/15/2011  . Dilated  aortic root ([place]) 08/15/2011  . Stroke ([place]) 10/2011  . Myocardial infarction ([place])   . Hyperlipidemia    Past Surgical History  Procedure Laterality Date  . Cardiac catheterization     Family History  Problem Relation Age of Onset  . Colon cancer Neg Hx   . Aneurysm Sister    Social History  Substance Use Topics  . Smoking status: Never Smoker   . Smokeless tobacco: Never Used  . Alcohol Use: No    Review of Systems  Respiratory: Positive for cough and shortness of breath.   All other systems reviewed and are negative.     Allergies  Review of patient's allergies indicates no known allergies.  Home Medications   Prior to Admission medications   Medication Sig Start Date End Date Taking? Authorizing Provider  amitriptyline (ELAVIL) 50 MG tablet Take 1 tablet (50 mg total) by mouth at bedtime as needed for sleep. Patient not taking: Reported on 01/21/2015 06/26/14 06/26/15  Biagio Borg, MD  amLODipine (NORVASC) 10 MG tablet Take 1 tablet (10 mg total) by mouth daily. 01/21/15   Rosemarie Ax, MD  atorvastatin (LIPITOR) 80 MG tablet Take 1 tablet (80 mg total) by mouth daily. Patient not taking: Reported on 01/21/2015 06/27/14   Biagio Borg, MD  cloNIDine (CATAPRES) 0.1 MG tablet Take 1 tablet (0.1 mg total) by mouth 2 (two) times daily. Patient not taking: Reported on 01/21/2015 06/26/14   Biagio Borg, MD  lisinopril-hydrochlorothiazide (  PRINZIDE,ZESTORETIC) 20-12.5 MG per tablet Take 1 tablet by mouth daily. 01/21/15   Rosemarie Ax, MD  tadalafil (CIALIS) 20 MG tablet Take 1 tablet (20 mg total) by mouth daily as needed for erectile dysfunction. Patient not taking: Reported on 01/21/2015 06/26/14   Biagio Borg, MD   BP 201/119 mmHg  Pulse 92  Temp(Src) 97.9 F (36.6 C) (Oral)  Resp 22  SpO2 99% Physical Exam  Constitutional: He is oriented to person, place, and time. He appears well-developed and well-nourished. No distress.  HENT:  Head: Normocephalic and  atraumatic.  Mouth/Throat: Oropharynx is clear and moist.  Eyes: Conjunctivae and EOM are normal. Pupils are equal, round, and reactive to light.  Neck: Normal range of motion. Neck supple. JVD present.  Cardiovascular: Normal rate, regular rhythm and intact distal pulses.   No murmur heard. Pulmonary/Chest: Effort normal. No respiratory distress. He has wheezes. He has no rales.  Abdominal: Soft. He exhibits no distension. There is no tenderness. There is no rebound and no guarding.  Musculoskeletal: Normal range of motion. He exhibits no edema or tenderness.  Neurological: He is alert and oriented to person, place, and time.  Skin: Skin is warm and dry. No rash noted. No erythema.  Psychiatric: He has a normal mood and affect. His behavior is normal.  Nursing note and vitals reviewed.   ED Course  Procedures (including critical care time) Labs Review Labs Reviewed  CBC WITH DIFFERENTIAL/PLATELET - Abnormal; Notable for the following:    Hemoglobin 11.9 (*)    HCT 37.3 (*)    MCV 67.9 (*)    MCH 21.7 (*)    RDW 15.7 (*)    All other components within normal limits  BRAIN NATRIURETIC PEPTIDE - Abnormal; Notable for the following:    B Natriuretic Peptide 301.8 (*)    All other components within normal limits  BASIC METABOLIC PANEL - Abnormal; Notable for the following:    Calcium 8.8 (*)    GFR calc non Af Amer 56 (*)    All other components within normal limits  Randolm Idol, ED    Imaging Review Dg Chest 2 View  06/19/2015  CLINICAL DATA:  Shortness of breath.  Wheezing. EXAM: CHEST  2 VIEW COMPARISON:  11/19/2011 FINDINGS: There is chronic cardiomegaly with tortuosity of the thoracic aorta. Pulmonary vascularity is normal and the lungs are clear. The diaphragm somewhat flattened suggesting emphysema. 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 IMPRESSION: No acute abnormalities.  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  Chronic  cardiomegaly. Electronically Signed   By: Lorriane Shire M.D.   On: 06/19/2015 11:12   I have personally reviewed and evaluated these images and lab results as part of my medical decision-making.   EKG Interpretation   Date/Time:  Wednesday June 19 2015 10:00:49 EST Ventricular Rate:  91 PR Interval:  181 QRS Duration: 96 QT Interval:  373 QTC Calculation: 459 R Axis:   -12 Text Interpretation:  Sinus rhythm Atrial premature complex new LVH with  secondary repolarization abnormality , new T wave inversion Anterior leads  Confirmed by Maryan Rued  MD, Loree Fee ([id number]) on 06/19/2015 10:25:42 AM      MDM   Final diagnoses:  SOB (shortness of breath)  Wheezing  Chest pain, unspecified chest pain type  Essential hypertension    Patient is a 74 year old male with multiple medical problems including prior MI coronary artery disease who is not currently taking medications presents today with symptoms  concerning for possible angina with CHF versus infectious etiology as he has had a cough, shortness of breath and wheezing that's worsened over the last 2 weeks. He denies any fever and his sputum has been white and frothy without color change. He denies any history of tobacco use or lung disease. Symptoms improve with rest and sitting upright. Today he is hypertensive with a blood pressure of 201/119. EKG with more prominent LVH than prior. CBC, BMP, troponin, BNP, chest x-ray pending. Patient given aspirin and albuterol for symptom improvement. Not currently having chest pain.  11:27 AM Some improvement after albuterol was given a second treatment.  Chest x-ray with chronic cardiomegaly without overt signs of fluid overload. BMP with a creatinine of 1.23. Troponin normal at 0.05. BNP elevated at 300 and CBC without acute findings. Given patient's symptoms, hypertension and concern for stable angina will admit for observation and cardiac rule out.  Blanchie Dessert, MD 06/19/15  [id number]  Blanchie Dessert, MD 06/19/15 1152

## 2015-06-19 NOTE — ED Notes (Signed)
Family at bedside. Updated on plan of care.

## 2015-06-19 NOTE — Progress Notes (Signed)
  Echocardiogram 2D Echocardiogram has been performed.  [name] 06/19/2015, 4:35 PM

## 2015-06-19 NOTE — ED Notes (Addendum)
Pt presents via POV c/o cough, runny nose, congestion, and SOB x 2 weeks.  Denies fever.  Pt a x 4, NAD.  Reports SOB worse with lying flat and with exertion.  Chest discomfort when coughing.

## 2015-06-19 NOTE — ED Notes (Signed)
MD at bedside. 

## 2015-06-19 NOTE — H&P (Signed)
[facility] History and Physical  [name] [id number] DOB: [birth year] DOA: 06/19/2015  Referring physician: Theora Gianotti PCP: Cathlean Cower, MD   Chief Complaint: DOE  HPI: [name] is a 74 y.o. male with a past medical history that includes hypertension, MI, CVA, CAD cath in 2008 with an EF of 60% LAD 20-30%, A. fib presents to the emergency Department chief complaint of gradual worsening dyspnea on exertion. Initial evaluation in the emergency department reveals elevated BNP and hypertensive urgency. Patient reports a gradual worsening of shortness of breath over the last 2 weeks. Associated symptoms include cough with white frothy sputum. He denies chest pain palpitations headache dizziness syncope or near-syncope. He denies abdominal pain nausea vomiting diarrhea. He denies lower extremity edema orthopnea. Symptoms are made worse with activity and lying down and nothing makes better. Additionally he reports not having taken any of his medications for at least 10 months related to lack of insurance. Also complains of frequency urinating.  Workup in the emergency department includes basic metabolic panel is unremarkable, complete blood count with a hemoglobin of 11.9 BNP of 301.8 chest x-ray no acute abnormalities. 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 Chronic cardiomegaly. EKG Sinus rhythm Atrial premature complex new LVH with secondary repolarization abnormality. Initial troponin 0.05  In the emergency department he is afebrile, blood pressure is 201/119 heart rate 92 he is not hypoxic. In the emergency department he is provided with nebulizers amlodipine 324 mg of aspirin clonidine Cipro hydrochlorothiazide and Lasix   Review of Systems:  10 point review of systems complete and all systems are negative except as indicated in the history of present illness.   Past Medical History  Diagnosis Date  . Hypertension     echocardiogram 6/10: Moderate LVH, EF 55-65%, mild, mild MR, MAC, mild  LAE, PASP 32  . Dyslipidemia   . History of stroke   . CAD (coronary artery disease)     cath in 1/08: EF 60%, mild dilated Ao root, oD2 30%, LAD 20-30%.  . Paroxysmal atrial fibrillation ([place])   . Anemia, iron deficiency 08/15/2011  . History of pneumonia 08/15/2011  . History of MI (myocardial infarction) 08/15/2011  . Dilated aortic root ([place]) 08/15/2011  . Stroke ([place]) 10/2011  . Myocardial infarction ([place])   . Hyperlipidemia    Past Surgical History  Procedure Laterality Date  . Cardiac catheterization     Social History:  reports that he has never smoked. He has never used smokeless tobacco. He reports that he does not drink alcohol or use illicit drugs.  No Known Allergies  Family History  Problem Relation Age of Onset  . Colon cancer Neg Hx   . Aneurysm Sister      Prior to Admission medications   Medication Sig Start Date End Date Taking? Authorizing Provider  amitriptyline (ELAVIL) 50 MG tablet Take 1 tablet (50 mg total) by mouth at bedtime as needed for sleep. Patient not taking: Reported on 01/21/2015 06/26/14 06/26/15  Biagio Borg, MD  amLODipine (NORVASC) 10 MG tablet Take 1 tablet (10 mg total) by mouth daily. 01/21/15   Rosemarie Ax, MD  atorvastatin (LIPITOR) 80 MG tablet Take 1 tablet (80 mg total) by mouth daily. Patient not taking: Reported on 01/21/2015 06/27/14   Biagio Borg, MD  cloNIDine (CATAPRES) 0.1 MG tablet Take 1 tablet (0.1 mg total) by mouth 2 (two) times daily. Patient not taking: Reported on 01/21/2015 06/26/14   Biagio Borg, MD  lisinopril-hydrochlorothiazide (PRINZIDE,ZESTORETIC) 20-12.5 MG per tablet  Take 1 tablet by mouth daily. 01/21/15   Rosemarie Ax, MD  tadalafil (CIALIS) 20 MG tablet Take 1 tablet (20 mg total) by mouth daily as needed for erectile dysfunction. Patient not taking: Reported on 01/21/2015 06/26/14   Biagio Borg, MD   Physical Exam: Filed Vitals:   06/19/15 1115 06/19/15 1121 06/19/15 1200 06/19/15 1221  BP:    182/103   Pulse: 84 75 91   Temp:      TempSrc:      Resp: 25 23 24    Weight:    78.019 kg (172 lb)  SpO2: 100% 100% 96%     Wt Readings from Last 3 Encounters:  06/19/15 78.019 kg (172 lb)  01/21/15 85.73 kg (189 lb)  06/26/14 81.647 kg (180 lb)    General:  Appears calm and comfortable Eyes: PERRL, normal lids, irises & conjunctiva ENT: grossly normal hearing, lips & tongue Neck: no LAD, masses or thyromegaly Cardiovascular: RRR, no m/r/g. No LE edema. Telemetry: SR, no arrhythmias  Respiratory: Normal effort with conversation breath sounds with scattered rhonchi and faint wheeze no crackles Abdomen: soft, ntnd Skin: no rash or induration seen on limited exam Musculoskeletal: grossly normal tone BUE/BLE Psychiatric: grossly normal mood and affect, speech fluent and appropriate Neurologic: grossly non-focal.          Labs on Admission:  Basic Metabolic Panel:  Recent Labs Lab 06/19/15 1039  NA 137  K 4.0  CL 107  CO2 22  GLUCOSE 92  BUN 15  CREATININE 1.23  CALCIUM 8.8*   Liver Function Tests: No results for input(s): AST, ALT, ALKPHOS, BILITOT, PROT, ALBUMIN in the last 168 hours. No results for input(s): LIPASE, AMYLASE in the last 168 hours. No results for input(s): AMMONIA in the last 168 hours. CBC:  Recent Labs Lab 06/19/15 1039  WBC 6.3  NEUTROABS 4.2  HGB 11.9*  HCT 37.3*  MCV 67.9*  PLT 157   Cardiac Enzymes: No results for input(s): CKTOTAL, CKMB, CKMBINDEX, TROPONINI in the last 168 hours.  BNP (last 3 results)  Recent Labs  06/19/15 1039  BNP 301.8*    ProBNP (last 3 results) No results for input(s): PROBNP in the last 8760 hours.  CBG: No results for input(s): GLUCAP in the last 168 hours.  Radiological Exams on Admission: Dg Chest 2 View  06/19/2015  CLINICAL DATA:  Shortness of breath.  Wheezing. EXAM: CHEST  2 VIEW COMPARISON:  11/19/2011 FINDINGS: There is chronic cardiomegaly with tortuosity of the thoracic aorta.  Pulmonary vascularity is normal and the lungs are clear. The diaphragm somewhat flattened suggesting emphysema. 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 IMPRESSION: No acute abnormalities.  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  Chronic cardiomegaly. Electronically Signed   By: Lorriane Shire M.D.   On: 06/19/2015 11:12    EKG: Independently reviewed as noted above  Assessment/Plan Principal Problem:   Hypertensive urgency Active Problems:   BPH (benign prostatic hyperplasia)   Anemia, iron deficiency   CAD (coronary artery disease)   CKD (chronic kidney disease), stage III   HTN (hypertension)   Atrial fibrillation (HCC)   DOE (dyspnea on exertion)  #1. Hypertensive urgency. Patient with a history of noncompliance with medication and PCP follow-ups. Reports not having taken his medications at least 10 months. -Admit to telemetry -Home medications include Norvasc clonidine lisinopril and hydrochlorothiazide. Will resume these.  - 3 mg Lasix IV 1  #2 dyspnea on exertion. May be related  to #1. 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 No hx smoking. Around second hand smoke mos to life -We'll obtain a 2-D echo -Continue hydrochlorothiazide -Intake and output and daily weights -nebs  #3. BPH with history of urinary retention. The summer patient had chronic Foley. Has not followed up with urology. -Bladder scan -Monitor urine output -In and out cath as needed -Schedule outpatient follow-up with urology  #4. Chronic kidney disease stage III -Stable  #5. History of A. fib. Not on anticoagulation -Currently sinus rhythm -Monitor  #6. CAD. Patient denies any chest pain. - Continue aspirin -Obtain a lipid panel   Code Status: full DVT Prophylaxis: Family Communication: none present Disposition Plan: home hopefully 24 hours  Time spent: 45 minutes  [facility]

## 2015-06-20 ENCOUNTER — Observation Stay (HOSPITAL_COMMUNITY): Payer: Medicare Other

## 2015-06-20 DIAGNOSIS — I160 Hypertensive urgency: Secondary | ICD-10-CM

## 2015-06-20 LAB — BASIC METABOLIC PANEL
ANION GAP: 12 (ref 5–15)
BUN: 24 mg/dL — ABNORMAL HIGH (ref 6–20)
CO2: 21 mmol/L — ABNORMAL LOW (ref 22–32)
Calcium: 8.8 mg/dL — ABNORMAL LOW (ref 8.9–10.3)
Chloride: 101 mmol/L (ref 101–111)
Creatinine, Ser: 1.51 mg/dL — ABNORMAL HIGH (ref 0.61–1.24)
GFR, EST AFRICAN AMERICAN: 51 mL/min — AB (ref >60)
GFR, EST NON AFRICAN AMERICAN: 44 mL/min — AB (ref >60)
GLUCOSE: 155 mg/dL — AB (ref 65–99)
POTASSIUM: 3.8 mmol/L (ref 3.5–5.1)
SODIUM: 134 mmol/L — AB (ref 135–145)

## 2015-06-20 LAB — CBC
HEMATOCRIT: 36.2 % — AB (ref 39.0–52.0)
HEMOGLOBIN: 11.5 g/dL — AB (ref 13.0–17.0)
MCH: 21.5 pg — ABNORMAL LOW (ref 26.0–34.0)
MCHC: 31.8 g/dL (ref 30.0–36.0)
MCV: 67.7 fL — ABNORMAL LOW (ref 78.0–100.0)
Platelets: 168 10*3/uL (ref 150–400)
RBC: 5.35 MIL/uL (ref 4.22–5.81)
RDW: 15.7 % — ABNORMAL HIGH (ref 11.5–15.5)
WBC: 6.6 10*3/uL (ref 4.0–10.5)

## 2015-06-20 LAB — TROPONIN I: Troponin I: 0.04 ng/mL — ABNORMAL HIGH (ref <0.031)

## 2015-06-20 LAB — OSMOLALITY: OSMOLALITY: 295 mosm/kg (ref 275–295)

## 2015-06-20 LAB — CREATININE, URINE, RANDOM: Creatinine, Urine: 237.35 mg/dL

## 2015-06-20 LAB — OSMOLALITY, URINE: OSMOLALITY UR: 611 mosm/kg (ref 300–900)

## 2015-06-20 LAB — SODIUM, URINE, RANDOM: SODIUM UR: 42 mmol/L

## 2015-06-20 MED ORDER — CARVEDILOL 3.125 MG PO TABS
3.1250 mg | ORAL_TABLET | Freq: Two times a day (BID) | ORAL | Status: DC
  Administered 2015-06-20 – 2015-06-21 (×2): 3.125 mg via ORAL
  Filled 2015-06-20 (×2): qty 1

## 2015-06-20 MED ORDER — NITROGLYCERIN 2 % TD OINT
0.5000 [in_us] | TOPICAL_OINTMENT | Freq: Four times a day (QID) | TRANSDERMAL | Status: DC
  Administered 2015-06-20 – 2015-06-21 (×5): 0.5 [in_us] via TOPICAL
  Filled 2015-06-20: qty 30

## 2015-06-20 MED ORDER — HYDRALAZINE HCL 20 MG/ML IJ SOLN: 10.0000 mg | Freq: Four times a day (QID) | INTRAMUSCULAR | Status: DC | PRN

## 2015-06-20 MED ORDER — ALBUTEROL SULFATE (2.5 MG/3ML) 0.083% IN NEBU
2.5000 mg | INHALATION_SOLUTION | Freq: Two times a day (BID) | RESPIRATORY_TRACT | Status: DC
  Administered 2015-06-20 – 2015-06-21 (×2): 2.5 mg via RESPIRATORY_TRACT
  Filled 2015-06-20 (×2): qty 3

## 2015-06-20 MED ORDER — FUROSEMIDE 40 MG PO TABS: 40.0000 mg | ORAL_TABLET | Freq: Once | ORAL | Status: DC

## 2015-06-20 MED ORDER — ENOXAPARIN SODIUM 40 MG/0.4ML ~~LOC~~ SOLN
40.0000 mg | SUBCUTANEOUS | Status: DC
  Administered 2015-06-20: 40 mg via SUBCUTANEOUS
  Filled 2015-06-20: qty 0.4

## 2015-06-20 MED ORDER — LISINOPRIL 20 MG PO TABS
20.0000 mg | ORAL_TABLET | Freq: Every day | ORAL | Status: DC
  Administered 2015-06-21: 20 mg via ORAL
  Filled 2015-06-20: qty 1

## 2015-06-20 NOTE — Care Management Note (Signed)
Case Management Note Marvetta Gibbons RN, BSN Unit 2W-Case Manager [phone] Covering 3W  Patient Details  Name: [name] MRN: [id number] Date of Birth: Jan 20, 1941  Subjective/Objective:  Pt admitted with HTN urgency                  Action/Plan: PTA pt lived at home- referral for ability to afford meds and PCP- spoke with pt at bedside- per conversation pt states that he does have a PCP- Cathlean Cower - has not seen PCP due to insurance issues- has Medicare-- per pt he has now signed up for [facility]- which will start in Jan. - pt also reports that there was a mix up with his Part D- and he was not able to afford his medications- pt will need generic meds that he can afford on the $4 list (at least until his new insurance starts in Jan.) pt states that he will be able to handle the $4 copays of generic meds until his part D starts in Jan.   Expected Discharge Date:                  Expected Discharge Plan:  Home/Self Care  In-House Referral:     Discharge planning Services  CM Consult, Medication Assistance  Post Acute Care Choice:    Choice offered to:     DME Arranged:    DME Agency:     HH Arranged:    [place] Agency:     Status of Service:  In process, will continue to follow  Medicare Important Message Given:    Date Medicare IM Given:    Medicare IM give by:    Date Additional Medicare IM Given:    Additional Medicare Important Message give by:     If discussed at [place] of Stay Meetings, dates discussed:    Additional Comments:  Dawayne Patricia, RN 06/20/2015, 4:06 PM

## 2015-06-20 NOTE — Care Management Obs Status (Addendum)
[facility] NOTIFICATION   Patient Details  Name: SHELDAN HELDRETH MRN: [id number] Date of Birth: Jun 04, 1941   Medicare Observation Status Notification Given:  Yes    Dawayne Patricia, RN 06/20/2015, 4:02 PM

## 2015-06-20 NOTE — Progress Notes (Signed)
Patient Demographics:    [name], is a 74 y.o. male, DOB - 08-14-1940, [id number]  Admit date - 06/19/2015   Admitting Physician Costin Karlyne Greenspan, MD  Outpatient Primary MD for the patient is Cathlean Cower, MD  LOS -    Chief Complaint  Patient presents with  . Cough  . Shortness of Breath        Subjective:    [name] today has, No headache, No chest pain, No abdominal pain - No Nausea, No new weakness tingling or numbness, No Cough - SOB.     Assessment  & Plan :     1. Flash pulmonary edema due to acute on chronic diastolic dysfunction caused by hypertensive urgency. Much improved after Lasix and diuresis, blood pressure in good control on present regimen which includes Norvasc, low-dose Coreg along with Nitropaste.   2. Hypertensive urgency. Stable continue Norvasc, Nitropaste and low-dose Coreg.   3. ARF. Likely slight over diuresis, BUN has gone up, hold further diuresis, monitor closely.   4. Mild troponin rise in non-ACS [name] due to #1 above causing demand ischemia. Echo stable without any wall motion abnormality, currently on statin, will add low-dose aspirin and Coreg. Monitor clinically.   5. Emphysema. Stable.    Code Status : Full  Family Communication  : none  Disposition Plan  : home 1-2 days  Consults  :  None  Procedures  :   TTE  - Left ventricle: The cavity size was normal. Wall thickness was increased in a pattern of moderate LVH. There was focal basal hypertrophy. Systolic function was normal. The estimated ejection fraction was in the range of 50% to 55%. Wall motion was normal; there were no regional wall motion abnormalities. - Aortic valve: Valve mobility was restricted. There was mild regurgitation. - Mitral valve: There  was mild regurgitation. - Left atrium: The atrium was moderately dilated. - Right atrium: The atrium was mildly dilated.  DVT Prophylaxis  :  Lovenox    Lab Results  Component Value Date   PLT 168 06/20/2015    Inpatient Medications  Scheduled Meds: . albuterol  2.5 mg Nebulization BID  . amLODipine  10 mg Oral Daily  . atorvastatin  80 mg Oral Daily  . enoxaparin (LOVENOX) injection  40 mg Subcutaneous Q24H  . furosemide  40 mg Oral Once  . [START ON 06/21/2015] lisinopril  20 mg Oral Daily  . predniSONE  40 mg Oral Q breakfast  . sodium chloride  3 mL Intravenous Q12H   Continuous Infusions:  PRN Meds:.acetaminophen **OR** [DISCONTINUED] acetaminophen, albuterol, alum & mag hydroxide-simeth, guaiFENesin-dextromethorphan, HYDROcodone-acetaminophen, [DISCONTINUED] ondansetron **OR** ondansetron (ZOFRAN) IV, senna-docusate  Antibiotics  :     Anti-infectives    None        Objective:   Filed Vitals:   06/20/15 0109 06/20/15 0400 06/20/15 0822 06/20/15 1114  BP:  125/78  126/74  Pulse:  71    Temp:  98 F (36.7 C)    TempSrc:  Oral    Resp:  18    Height:      Weight:  74.98 kg (165 lb 4.8 oz)    SpO2: 97% 97% 94%     Wt Readings from Last 3 Encounters:  06/20/15 74.98 kg (165 lb 4.8 oz)  01/21/15 85.73 kg (189 lb)  06/26/14 81.647 kg (180 lb)     Intake/Output Summary (Last 24 hours) at 06/20/15 1205 Last data filed at 06/20/15 1100  Gross per 24 hour  Intake    786 ml  Output    450 ml  Net    336 ml     Physical Exam  Awake Alert, Oriented X 3, No new F.N deficits, Normal affect [place].AT,PERRAL Supple Neck,No JVD, No cervical lymphadenopathy appriciated.  Symmetrical Chest wall movement, Good air movement bilaterally, few rales RRR,No Gallops,Rubs or new Murmurs, No Parasternal Heave +ve B.Sounds, Abd Soft, No tenderness, No organomegaly appriciated, No rebound - guarding or rigidity. No Cyanosis, Clubbing or edema, No new Rash or bruise        Data Review:   Micro Results No results found for this or any previous visit (from the past 240 hour(s)).  Radiology Reports Dg Chest 2 View  06/19/2015  CLINICAL DATA:  Shortness of breath.  Wheezing. EXAM: CHEST  2 VIEW COMPARISON:  11/19/2011 FINDINGS: There is chronic cardiomegaly with tortuosity of the thoracic aorta. Pulmonary vascularity is normal and the lungs are clear. The diaphragm somewhat flattened suggesting emphysema. 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 IMPRESSION: No acute abnormalities.  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  Chronic cardiomegaly. Electronically Signed   By: Lorriane Shire M.D.   On: 06/19/2015 11:12     CBC  Recent Labs Lab 06/19/15 1039 06/20/15 0102  WBC 6.3 6.6  HGB 11.9* 11.5*  HCT 37.3* 36.2*  PLT 157 168  MCV 67.9* 67.7*  MCH 21.7* 21.5*  MCHC 31.9 31.8  RDW 15.7* 15.7*  LYMPHSABS 1.4  --   MONOABS 0.5  --   EOSABS 0.2  --   BASOSABS 0.0  --     Chemistries   Recent Labs Lab 06/19/15 1039 06/20/15 0102  NA 137 134*  K 4.0 3.8  CL 107 101  CO2 22 21*  GLUCOSE 92 155*  BUN 15 24*  CREATININE 1.23 1.51*  CALCIUM 8.8* 8.8*   ------------------------------------------------------------------------------------------------------------------ estimated creatinine clearance is 39.3 mL/min (by C-G formula based on Cr of 1.51). ------------------------------------------------------------------------------------------------------------------ No results for input(s): HGBA1C in the last 72 hours. ------------------------------------------------------------------------------------------------------------------  Recent Labs  06/19/15 1643  CHOL 284*  HDL 53  LDLCALC 219*  TRIG 59  CHOLHDL 5.4   ------------------------------------------------------------------------------------------------------------------ No results for input(s): TSH, T4TOTAL, T3FREE, THYROIDAB in the last 72  hours.  Invalid input(s): FREET3 ------------------------------------------------------------------------------------------------------------------ No results for input(s): VITAMINB12, FOLATE, FERRITIN, TIBC, IRON, RETICCTPCT in the last 72 hours.  Coagulation profile  Recent Labs Lab 06/19/15 1643  INR 1.16    No results for input(s): DDIMER in the last 72 hours.  Cardiac Enzymes  Recent Labs Lab 06/19/15 1643 06/19/15 1910 06/20/15 0102  TROPONINI 0.03 0.08* 0.04*   ------------------------------------------------------------------------------------------------------------------ Invalid input(s): POCBNP   Time Spent in minutes   35   SINGH,PRASHANT K M.D on 06/20/2015 at 12:05 PM  Between 7am to 7pm - Pager - [phone]  After 7pm go to [email or web address] - password [email or web address]  [facility] -  Office  [phone]

## 2015-06-21 DIAGNOSIS — I160 Hypertensive urgency: Secondary | ICD-10-CM | POA: Diagnosis not present

## 2015-06-21 LAB — BASIC METABOLIC PANEL
ANION GAP: 7 (ref 5–15)
BUN: 29 mg/dL — ABNORMAL HIGH (ref 6–20)
CO2: 27 mmol/L (ref 22–32)
Calcium: 8.6 mg/dL — ABNORMAL LOW (ref 8.9–10.3)
Chloride: 101 mmol/L (ref 101–111)
Creatinine, Ser: 1.36 mg/dL — ABNORMAL HIGH (ref 0.61–1.24)
GFR, EST AFRICAN AMERICAN: 58 mL/min — AB (ref >60)
GFR, EST NON AFRICAN AMERICAN: 50 mL/min — AB (ref >60)
GLUCOSE: 81 mg/dL (ref 65–99)
POTASSIUM: 3.7 mmol/L (ref 3.5–5.1)
Sodium: 135 mmol/L (ref 135–145)

## 2015-06-21 LAB — MAGNESIUM: Magnesium: 2.1 mg/dL (ref 1.7–2.4)

## 2015-06-21 MED ORDER — ISOSORBIDE MONONITRATE ER 30 MG PO TB24: 30.0000 mg | ORAL_TABLET | Freq: Every day | ORAL | Status: DC

## 2015-06-21 MED ORDER — ALBUTEROL SULFATE HFA 108 (90 BASE) MCG/ACT IN AERS: 2.0000 | INHALATION_SPRAY | Freq: Four times a day (QID) | RESPIRATORY_TRACT | Status: DC | PRN

## 2015-06-21 MED ORDER — LISINOPRIL 5 MG PO TABS
5.0000 mg | ORAL_TABLET | Freq: Every day | ORAL | Status: DC
Start: 2015-06-21 — End: 2015-09-26

## 2015-06-21 MED ORDER — ASPIRIN EC 81 MG PO TBEC: 81.0000 mg | DELAYED_RELEASE_TABLET | Freq: Every day | ORAL | Status: DC

## 2015-06-21 MED ORDER — CARVEDILOL 3.125 MG PO TABS: 3.1250 mg | ORAL_TABLET | Freq: Two times a day (BID) | ORAL | Status: DC

## 2015-06-21 MED ORDER — FUROSEMIDE 20 MG PO TABS: 20.0000 mg | ORAL_TABLET | Freq: Every day | ORAL | Status: DC

## 2015-06-21 NOTE — Discharge Instructions (Signed)
Follow with Primary MD Cathlean Cower, MD in 7 days   Get CBC, CMP, 2 view Chest X ray checked  by Primary MD next visit.    Activity: As tolerated with Full fall precautions use walker/cane & assistance as needed   Disposition Home    Diet: Heart Healthy  Check your Weight same time everyday, if you gain over 2 pounds, or you develop in leg swelling, experience more shortness of breath or chest pain, call your Primary MD immediately. Follow Cardiac Low Salt Diet and 1.5 lit/day fluid restriction.   On your next visit with your primary care physician please Get Medicines reviewed and adjusted.   Please request your Prim.MD to go over all Hospital Tests and Procedure/Radiological results at the follow up, please get all Hospital records sent to your Prim MD by signing hospital release before you go home.   If you experience worsening of your admission symptoms, develop shortness of breath, life threatening emergency, suicidal or homicidal thoughts you must seek medical attention immediately by calling 911 or calling your MD immediately  if symptoms less severe.  You Must read complete instructions/literature along with all the possible adverse reactions/side effects for all the Medicines you take and that have been prescribed to you. Take any new Medicines after you have completely understood and accpet all the possible adverse reactions/side effects.   Do not drive, operating heavy machinery, perform activities at heights, swimming or participation in water activities or provide baby sitting services if your were admitted for syncope or siezures until you have seen by Primary MD or a Neurologist and advised to do so again.  Do not drive when taking Pain medications.    Do not take more than prescribed Pain, Sleep and Anxiety Medications  Special Instructions: If you have smoked or chewed Tobacco  in the last 2 yrs please stop smoking, stop any regular Alcohol  and or any Recreational drug  use.  Wear Seat belts while driving.   Please note  You were cared for by a hospitalist during your hospital stay. If you have any questions about your discharge medications or the care you received while you were in the hospital after you are discharged, you can call the unit and asked to speak with the hospitalist on call if the hospitalist that took care of you is not available. Once you are discharged, your primary care physician will handle any further medical issues. Please note that NO REFILLS for any discharge medications will be authorized once you are discharged, as it is imperative that you return to your primary care physician (or establish a relationship with a primary care physician if you do not have one) for your aftercare needs so that they can reassess your need for medications and monitor your lab values.

## 2015-06-21 NOTE — Discharge Summary (Signed)
[name], is a 74 y.o. male  DOB [birth year]  MRN [id number].  Admission date:  06/19/2015  Admitting Physician  Costin Karlyne Greenspan, MD  Discharge Date:  06/21/2015   Primary MD  Cathlean Cower, MD  Recommendations for primary care physician for things to follow:   Monitor weight, blood pressure, BMP and diuretic dose closely. Outpatient cardiology follow-up.   Admission Diagnosis  Wheezing [R06.2] SOB (shortness of breath) [R06.02] Essential hypertension [I10] Chest pain, unspecified chest pain type [R07.9]   Discharge Diagnosis  Wheezing [R06.2] SOB (shortness of breath) [R06.02] Essential hypertension [I10] Chest pain, unspecified chest pain type [R07.9]    Principal Problem:   Hypertensive urgency Active Problems:   BPH (benign prostatic hyperplasia)   Anemia, iron deficiency   CAD (coronary artery disease)   CKD (chronic kidney disease), stage III   HTN (hypertension)   Atrial fibrillation (HCC)   DOE (dyspnea on exertion)   Wheezing      Past Medical History  Diagnosis Date  . Hypertension     echocardiogram 6/10: Moderate LVH, EF 55-65%, mild, mild MR, MAC, mild LAE, PASP 32  . Dyslipidemia   . History of stroke   . CAD (coronary artery disease)     cath in 1/08: EF 60%, mild dilated Ao root, oD2 30%, LAD 20-30%.  . Paroxysmal atrial fibrillation ([place])   . Anemia, iron deficiency 08/15/2011  . History of pneumonia 08/15/2011  . History of MI (myocardial infarction) 08/15/2011  . Dilated aortic root ([place]) 08/15/2011  . Stroke ([place]) 10/2011  . Myocardial infarction ([place])   . Hyperlipidemia   . Shortness of breath dyspnea     Past Surgical History  Procedure Laterality Date  . Cardiac catheterization         HPI  from the history and physical done on the day of admission:    [name] is a 74 y.o. male with a past medical history that includes hypertension, MI, CVA, CAD cath in 2008 with an EF of 60% LAD 20-30%, A. fib presents to the emergency Department chief complaint of gradual worsening dyspnea on exertion. Initial evaluation in the emergency department reveals elevated BNP and hypertensive urgency. Patient reports a gradual worsening of shortness of breath over the last 2 weeks. Associated symptoms include cough with white frothy sputum. He denies chest pain palpitations headache dizziness syncope or near-syncope. He denies abdominal pain nausea vomiting diarrhea. He denies lower extremity edema orthopnea. Symptoms are made worse with activity and lying down and nothing makes better. Additionally he reports not having taken any of his medications for at least 10 months related to lack of insurance. Also complains of frequency urinating.  Workup in the emergency department includes basic metabolic panel is unremarkable, complete blood count with a hemoglobin of 11.9 BNP of 301.8 chest x-ray no acute abnormalities. 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 Chronic cardiomegaly. EKG Sinus rhythm Atrial premature complex new LVH with secondary repolarization abnormality. Initial troponin 0.05  In the emergency department he is afebrile, blood pressure is  201/119 heart rate 92 he is not hypoxic. In the emergency department he is provided with nebulizers amlodipine 324 mg of aspirin clonidine Cipro hydrochlorothiazide and Lasix     Hospital Course:    1. Flash pulmonary edema due to acute on chronic diastolic dysfunction caused by hypertensive urgency. Much improved after Lasix and diuresis, blood pressure in good control on present regimen which includes Norvasc, low-dose Coreg along with indoor and low-dose ACE inhibitor. Outpatient cardiology follow-up for CHF. Placed on low-dose Lasix. Request PCP to monitor BMP and weight closely.   2. Hypertensive urgency. Stable on adjusted  regimen below which includes Coreg, Norvasc, Imdur and low-dose lisinopril.   3. ARF mild on CK D3. Baseline creatinine close to 1.3. Currently at baseline now.   4. Mild troponin rise in non-ACS [name] due to #1 above causing demand ischemia. Echo stable without any wall motion abnormality, we'll place him on appropriate medical regimen which will include aspirin, Coreg, statin. Outpatient cardiology follow-up. Remained chest pain-free.   5. Emphysema. Stable. Never smoked, outpatient pulmonary follow-up.? alfa1 deficiency liver function is stable.   Discharge Condition: Stable  Follow UP  Follow-up Information    Follow up with Cathlean Cower, MD. Schedule an appointment as soon as possible for a visit in 1 week.   Specialties:  Internal Medicine, Radiology   Contact information:   [address] [place] [address] [phone]       Follow up with Candee Furbish, MD. Schedule an appointment as soon as possible for a visit in 1 week.   Specialty:  Cardiology   Why:  CHF   Contact information:   [id number] [place] [address] [phone]       Follow up with Tera Partridge, MD. Schedule an appointment as soon as possible for a visit in 1 week.   Specialty:  Pulmonary Disease   Why:  Emphysema   Contact information:   [address] [place] [address] [phone]        Consults obtained - None  Diet and Activity recommendation: See Discharge Instructions below  Discharge Instructions       Discharge Instructions    Diet - low sodium heart healthy    Complete by:  As directed      Discharge instructions    Complete by:  As directed   Follow with Primary MD Cathlean Cower, MD in 7 days   Get CBC, CMP, 2 view Chest X ray checked  by Primary MD next visit.    Activity: As tolerated with Full fall precautions use walker/cane & assistance as needed   Disposition Home    Diet: Heart Healthy  Check your Weight same time  everyday, if you gain over 2 pounds, or you develop in leg swelling, experience more shortness of breath or chest pain, call your Primary MD immediately. Follow Cardiac Low Salt Diet and 1.5 lit/day fluid restriction.   On your next visit with your primary care physician please Get Medicines reviewed and adjusted.   Please request your Prim.MD to go over all Hospital Tests and Procedure/Radiological results at the follow up, please get all Hospital records sent to your Prim MD by signing hospital release before you go home.   If you experience worsening of your admission symptoms, develop shortness of breath, life threatening emergency, suicidal or homicidal thoughts you must seek medical attention immediately by calling 911 or calling your MD immediately  if symptoms  less severe.  You Must read complete instructions/literature along with all the possible adverse reactions/side effects for all the Medicines you take and that have been prescribed to you. Take any new Medicines after you have completely understood and accpet all the possible adverse reactions/side effects.   Do not drive, operating heavy machinery, perform activities at heights, swimming or participation in water activities or provide baby sitting services if your were admitted for syncope or siezures until you have seen by Primary MD or a Neurologist and advised to do so again.  Do not drive when taking Pain medications.    Do not take more than prescribed Pain, Sleep and Anxiety Medications  Special Instructions: If you have smoked or chewed Tobacco  in the last 2 yrs please stop smoking, stop any regular Alcohol  and or any Recreational drug use.  Wear Seat belts while driving.   Please note  You were cared for by a hospitalist during your hospital stay. If you have any questions about your discharge medications or the care you received while you were in the hospital after you are discharged, you can call the unit and  asked to speak with the hospitalist on call if the hospitalist that took care of you is not available. Once you are discharged, your primary care physician will handle any further medical issues. Please note that NO REFILLS for any discharge medications will be authorized once you are discharged, as it is imperative that you return to your primary care physician (or establish a relationship with a primary care physician if you do not have one) for your aftercare needs so that they can reassess your need for medications and monitor your lab values.     Increase activity slowly    Complete by:  As directed              Discharge Medications       Medication List    STOP taking these medications        cloNIDine 0.1 MG tablet  Commonly known as:  CATAPRES     lisinopril-hydrochlorothiazide 20-12.5 MG tablet  Commonly known as:  PRINZIDE,ZESTORETIC      TAKE these medications        albuterol 108 (90 BASE) MCG/ACT inhaler  Commonly known as:  PROVENTIL HFA;VENTOLIN HFA  Inhale 2 puffs into the lungs every 6 (six) hours as needed for wheezing or shortness of breath.     amitriptyline 50 MG tablet  Commonly known as:  ELAVIL  Take 1 tablet (50 mg total) by mouth at bedtime as needed for sleep.     amLODipine 10 MG tablet  Commonly known as:  NORVASC  Take 1 tablet (10 mg total) by mouth daily.     atorvastatin 80 MG tablet  Commonly known as:  LIPITOR  Take 1 tablet (80 mg total) by mouth daily.     carvedilol 3.125 MG tablet  Commonly known as:  COREG  Take 1 tablet (3.125 mg total) by mouth 2 (two) times daily with a meal.     furosemide 20 MG tablet  Commonly known as:  LASIX  Take 1 tablet (20 mg total) by mouth daily.     isosorbide mononitrate 30 MG 24 hr tablet  Commonly known as:  IMDUR  Take 1 tablet (30 mg total) by mouth daily.     lisinopril 5 MG tablet  Commonly known as:  PRINIVIL,ZESTRIL  Take 1 tablet (5 mg total) by mouth daily.  tadalafil 20 MG  tablet  Commonly known as:  CIALIS  Take 1 tablet (20 mg total) by mouth daily as needed for erectile dysfunction.        Major procedures and Radiology Reports - PLEASE review detailed and final reports for all details, in brief -   TTE  - Left ventricle: The cavity size was normal. Wall thickness wasincreased in a pattern of moderate LVH. There was focal basalhypertrophy. Systolic function was normal. The estimated ejectionfraction was in the range of 50% to 55%. Wall motion was normal;there were no regional wall motion abnormalities. - Aortic valve: Valve mobility was restricted. There was mildregurgitation. - Mitral valve: There was mild regurgitation. - Left atrium: The atrium was moderately dilated. - Right atrium: The atrium was mildly dilated.    Dg Chest 2 View  06/20/2015  CLINICAL DATA:  Cough and shortness of breath. EXAM: CHEST  2 VIEW COMPARISON:  06/19/2015 FINDINGS: The heart is borderline enlarged but stable. Moderate tortuosity and calcification of the thoracic aorta. Chronic appearing bronchitic lung changes but no definite infiltrates, edema or effusions. Stable apical densities, which appears to be ectatic vasculature based on prior chest CT. The bony thorax is intact. IMPRESSION: Chronic appearing bronchitic type interstitial lung changes without definite acute overlying pulmonary process such as infiltrate or effusion. Electronically Signed   By: Marijo Sanes M.D.   On: 06/20/2015 13:01   Dg Chest 2 View  06/19/2015  CLINICAL DATA:  Shortness of breath.  Wheezing. EXAM: CHEST  2 VIEW COMPARISON:  11/19/2011 FINDINGS: There is chronic cardiomegaly with tortuosity of the thoracic aorta. Pulmonary vascularity is normal and the lungs are clear. The diaphragm somewhat flattened suggesting emphysema. 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 IMPRESSION: No acute abnormalities.  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  Chronic  cardiomegaly. Electronically Signed   By: Lorriane Shire M.D.   On: 06/19/2015 11:12    Micro Results      No results found for this or any previous visit (from the past 240 hour(s)).     Today   Subjective    Juanangel Chanthavong today has no headache,no chest abdominal pain,no new weakness tingling or numbness, feels much better wants to go home today.     Objective   Blood pressure 149/77, pulse 77, temperature 98.4 F (36.9 C), temperature source Oral, resp. rate 18, height 5\' 6"  (1.676 m), weight 76.522 kg (168 lb 11.2 oz), SpO2 94 %.   Intake/Output Summary (Last 24 hours) at 06/21/15 1011 Last data filed at 06/21/15 0930  Gross per 24 hour  Intake    540 ml  Output    650 ml  Net   -110 ml    Exam Awake Alert, Oriented x 3, No new F.N deficits, Normal affect [place].AT,PERRAL Supple Neck,No JVD, No cervical lymphadenopathy appriciated.  Symmetrical Chest wall movement, Good air movement bilaterally, CTAB RRR,No Gallops,Rubs or new Murmurs, No Parasternal Heave +ve B.Sounds, Abd Soft, Non tender, No organomegaly appriciated, No rebound -guarding or rigidity. No Cyanosis, Clubbing or edema, No new Rash or bruise   Data Review   CBC w Diff: Lab Results  Component Value Date   WBC 6.6 06/20/2015   HGB 11.5* 06/20/2015   HCT 36.2* 06/20/2015   PLT 168 06/20/2015   LYMPHOPCT 22 06/19/2015   MONOPCT 8 06/19/2015   EOSPCT 3 06/19/2015   BASOPCT 0 06/19/2015    CMP: Lab Results  Component Value Date   NA  135 06/21/2015   K 3.7 06/21/2015   CL 101 06/21/2015   CO2 27 06/21/2015   BUN 29* 06/21/2015   CREATININE 1.36* 06/21/2015   PROT 7.2 06/27/2014   ALBUMIN 3.6 06/27/2014   BILITOT 0.4 06/27/2014   ALKPHOS 72 06/27/2014   AST 17 06/27/2014   ALT 15 06/27/2014  .   Total Time in preparing paper work, data evaluation and todays exam - 35 minutes  Thurnell Lose M.D on 06/21/2015 at 10:11 AM  [facility]   Office  [phone]

## 2015-07-02 ENCOUNTER — Ambulatory Visit: Payer: Medicare Other | Admitting: Internal Medicine

## 2015-07-02 DIAGNOSIS — Z0289 Encounter for other administrative examinations: Secondary | ICD-10-CM

## 2015-07-09 NOTE — Progress Notes (Signed)
Cardiology Office Note   Date:  07/09/2015   ID:  [name], DOB Nov 01, 1940, MRN [id number]   Patient Care Team: Biagio Borg, MD as PCP - General (Internal Medicine)    Chief Complaint  Patient presents with  . Re-Establish/New Patient    last seen by Dr. Lia Foyer 03/2012  . Hospitalization Follow-up    CHF     History of Present Illness: [name] is a 74 y.o. male with a hx of    Studies/Reports Reviewed Today:  Echo 06/19/15 - Left ventricle: The cavity size was normal. Wall thickness was increased in a pattern of moderate LVH. There was focal basal hypertrophy. Systolic function was normal. The estimated ejection fraction was in the range of 50% to 55%. Wall motion was normal; there were no regional wall motion abnormalities. - Aortic valve: Valve mobility was restricted. There was mild regurgitation. - Mitral valve: There was mild regurgitation. - Left atrium: The atrium was moderately dilated. - Right atrium: The atrium was mildly dilated.  Carotid US 4/13 No ICA stenosis  LHC 08/2006 ANGIOGRAPHIC DATA: 1. Ventriculography was done in the RAO projection. Overall systolic  function appeared preserved. Overall LV function was vigorous.  Ejection fraction was in excess of 60%. 2. The aortic root did not demonstrate significant aortic  regurgitation. There was no obvious evidence of dissection. There  was some mild dilatation of the aortic root although it did not  appear to be substantially enlarged. 3. The patient's coronary system was left dominant. 4. The left main was free of critical disease. 5. The LAD coursed to the apex. There was a first diagonal without  significant narrowing. The second diagonal had about 30% ostial  narrowing, and there was 20-30% narrowing in the LAD just after  this. High-grade stenosis was not noted, and the runoff into the  distal LAD was excellent. 6.  The circumflex was a dominant vessel. There was a large major  marginal branch that bifurcated. The distal vessel consisted of 2  posterolateral branches and a small posterior descending branch.  There was minor luminal irregularity. 7. The right coronary was nondominant and without critical narrowing.  CONCLUSION: 1. Rapid atrial fibrillation, new-onset in etiology. 2. Positive enzymes, probably secondary to #1. 3. Preserved overall left ventricular function. 4. No critical coronary artery disease.  Past Medical History  Diagnosis Date  . Hypertension     echocardiogram 6/10: Moderate LVH, EF 55-65%, mild, mild MR, MAC, mild LAE, PASP 32  . Dyslipidemia   . History of stroke   . CAD (coronary artery disease)     cath in 1/08: EF 60%, mild dilated Ao root, oD2 30%, LAD 20-30%.  . Paroxysmal atrial fibrillation ([place])   . Anemia, iron deficiency 08/15/2011  . History of pneumonia 08/15/2011  . History of MI (myocardial infarction) 08/15/2011  . 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 Stroke ([place]) 10/2011  . Myocardial infarction ([place])   . Hyperlipidemia   . Shortness of breath dyspnea     Past Surgical History  Procedure Laterality Date  . Cardiac catheterization       Current Outpatient Prescriptions  Medication Sig Dispense Refill  . albuterol (PROVENTIL HFA;VENTOLIN HFA) 108 (90 BASE) MCG/ACT inhaler Inhale 2 puffs into the lungs every 6 (six) hours as needed for wheezing or shortness of breath. 1 Inhaler 2  . amitriptyline (ELAVIL) 50 MG tablet Take 1 tablet (50 mg total) by mouth at bedtime as needed for sleep. (Patient  not taking: Reported on 01/21/2015) 90 tablet 1  . amLODipine (NORVASC) 10 MG tablet Take 1 tablet (10 mg total) by mouth daily. (Patient not taking: Reported on 06/19/2015) 30 tablet 0  . aspirin EC 81 MG tablet Take 1 tablet (81 mg total) by mouth daily. 30 tablet 0  . atorvastatin (LIPITOR) 80 MG tablet Take 1 tablet (80 mg total) by  mouth daily. (Patient not taking: Reported on 01/21/2015) 90 tablet 3  . carvedilol (COREG) 3.125 MG tablet Take 1 tablet (3.125 mg total) by mouth 2 (two) times daily with a meal. 60 tablet 0  . furosemide (LASIX) 20 MG tablet Take 1 tablet (20 mg total) by mouth daily. 30 tablet 0  . isosorbide mononitrate (IMDUR) 30 MG 24 hr tablet Take 1 tablet (30 mg total) by mouth daily. 30 tablet 0  . lisinopril (PRINIVIL,ZESTRIL) 5 MG tablet Take 1 tablet (5 mg total) by mouth daily. 30 tablet 0  . tadalafil (CIALIS) 20 MG tablet Take 1 tablet (20 mg total) by mouth daily as needed for erectile dysfunction. (Patient not taking: Reported on 01/21/2015) 5 tablet 11   No current facility-administered medications for this visit.    Allergies:   Review of patient's allergies indicates no known allergies.    Social History:   Social History   Social History  . Marital Status: Widowed    Spouse Name: N/A  . Number of Children: 2  . Years of Education: N/A   Occupational History  . retired    Social History Main Topics  . Smoking status: Never Smoker   . Smokeless tobacco: Never Used  . Alcohol Use: No  . Drug Use: No  . Sexual Activity: Not on file   Other Topics Concern  . Not on file   Social History Narrative     Family History:   Family History  Problem Relation Age of Onset  . Colon cancer Neg Hx   . Aneurysm Sister       ROS:   Please see the history of present illness.   ROS    PHYSICAL EXAM: VS:  There were no vitals taken for this visit.    Wt Readings from Last 3 Encounters:  06/21/15 168 lb 11.2 oz (76.522 kg)  01/21/15 189 lb (85.73 kg)  06/26/14 180 lb (81.647 kg)     GEN: Well nourished, well developed, in no acute distress HEENT: normal Neck: no JVD, no carotid bruits, no masses Cardiac:  Normal S1/S2, RRR; no murmur ,  no rubs or gallops, no edema  Respiratory:  clear to auscultation bilaterally, no wheezing, rhonchi or rales. GI: soft, nontender,  nondistended, + BS MS: no deformity or atrophy Skin: warm and dry  Neuro:  CNs II-XII intact, Strength and sensation are intact Psych: Normal affect   EKG:  EKG is ordered today.  It demonstrates:      Recent Labs: 06/19/2015: B Natriuretic Peptide 301.8* 06/20/2015: Hemoglobin 11.5*; Platelets 168 06/21/2015: BUN 29*; Creatinine, Ser 1.36*; Magnesium 2.1; Potassium 3.7; Sodium 135    Lipid Panel    Component Value Date/Time   CHOL 284* 06/19/2015 1643   TRIG 59 06/19/2015 1643   HDL 53 06/19/2015 1643   CHOLHDL 5.4 06/19/2015 1643   VLDL 12 06/19/2015 1643   LDLCALC 219* 06/19/2015 1643   LDLDIRECT 230.0 01/20/2013 1547      ASSESSMENT AND PLAN:      Medication Changes: Current medicines are reviewed at length with the patient today.  Concerns regarding medicines are as outlined above.  The following changes have been made:   Discontinued Medications   No medications on file   Modified Medications   No medications on file   New Prescriptions   No medications on file   Labs/ tests ordered today include:   No orders of the defined types were placed in this encounter.     Disposition:    FU with     Signed, Versie Starks, MHS 07/09/2015 10:43 PM    [facility] [address], [place], [place]  [address] Phone: [phone]; Fax: [phone]    This encounter was created in error - please disregard.

## 2015-07-10 ENCOUNTER — Encounter: Payer: Medicare Other | Admitting: Physician Assistant

## 2015-07-10 DIAGNOSIS — R0989 Other specified symptoms and signs involving the circulatory and respiratory systems: Secondary | ICD-10-CM

## 2015-07-17 ENCOUNTER — Encounter: Payer: Self-pay | Admitting: Physician Assistant

## 2015-09-26 ENCOUNTER — Other Ambulatory Visit: Payer: Self-pay

## 2015-09-26 ENCOUNTER — Encounter: Payer: Self-pay | Admitting: Internal Medicine

## 2015-09-26 MED ORDER — CARVEDILOL 3.125 MG PO TABS: 3.1250 mg | ORAL_TABLET | Freq: Two times a day (BID) | ORAL | Status: DC

## 2015-09-26 MED ORDER — LISINOPRIL 5 MG PO TABS: 5.0000 mg | ORAL_TABLET | Freq: Every day | ORAL | Status: DC

## 2015-10-06 ENCOUNTER — Inpatient Hospital Stay (HOSPITAL_COMMUNITY)
Admission: EM | Admit: 2015-10-06 | Discharge: 2015-10-14 | DRG: 038 | Disposition: A | Discharge status: Rehabilitation Facility | Payer: Medicare Other | Attending: Internal Medicine | Admitting: Internal Medicine

## 2015-10-06 ENCOUNTER — Encounter (HOSPITAL_COMMUNITY): Payer: Self-pay | Admitting: *Deleted

## 2015-10-06 ENCOUNTER — Emergency Department (HOSPITAL_COMMUNITY): Payer: Medicare Other

## 2015-10-06 DIAGNOSIS — I63311 Cerebral infarction due to thrombosis of right middle cerebral artery: Secondary | ICD-10-CM | POA: Diagnosis not present

## 2015-10-06 DIAGNOSIS — I480 Paroxysmal atrial fibrillation: Secondary | ICD-10-CM | POA: Diagnosis present

## 2015-10-06 DIAGNOSIS — R7303 Prediabetes: Secondary | ICD-10-CM | POA: Diagnosis present

## 2015-10-06 DIAGNOSIS — Z01818 Encounter for other preprocedural examination: Secondary | ICD-10-CM

## 2015-10-06 DIAGNOSIS — Z7982 Long term (current) use of aspirin: Secondary | ICD-10-CM | POA: Diagnosis not present

## 2015-10-06 DIAGNOSIS — D509 Iron deficiency anemia, unspecified: Secondary | ICD-10-CM | POA: Diagnosis not present

## 2015-10-06 DIAGNOSIS — I63231 Cerebral infarction due to unspecified occlusion or stenosis of right carotid arteries: Secondary | ICD-10-CM | POA: Diagnosis not present

## 2015-10-06 DIAGNOSIS — I6789 Other cerebrovascular disease: Secondary | ICD-10-CM | POA: Diagnosis not present

## 2015-10-06 DIAGNOSIS — R4781 Slurred speech: Secondary | ICD-10-CM | POA: Diagnosis present

## 2015-10-06 DIAGNOSIS — N400 Enlarged prostate without lower urinary tract symptoms: Secondary | ICD-10-CM | POA: Diagnosis not present

## 2015-10-06 DIAGNOSIS — R55 Syncope and collapse: Secondary | ICD-10-CM | POA: Diagnosis present

## 2015-10-06 DIAGNOSIS — I639 Cerebral infarction, unspecified: Secondary | ICD-10-CM | POA: Diagnosis not present

## 2015-10-06 DIAGNOSIS — K5900 Constipation, unspecified: Secondary | ICD-10-CM | POA: Diagnosis not present

## 2015-10-06 DIAGNOSIS — N401 Enlarged prostate with lower urinary tract symptoms: Secondary | ICD-10-CM | POA: Diagnosis not present

## 2015-10-06 DIAGNOSIS — Z8673 Personal history of transient ischemic attack (TIA), and cerebral infarction without residual deficits: Secondary | ICD-10-CM | POA: Diagnosis not present

## 2015-10-06 DIAGNOSIS — R339 Retention of urine, unspecified: Secondary | ICD-10-CM | POA: Diagnosis not present

## 2015-10-06 DIAGNOSIS — R000 Tachycardia, unspecified: Secondary | ICD-10-CM | POA: Diagnosis present

## 2015-10-06 DIAGNOSIS — N183 Chronic kidney disease, stage 3 unspecified: Secondary | ICD-10-CM | POA: Diagnosis present

## 2015-10-06 DIAGNOSIS — R29701 NIHSS score 1: Secondary | ICD-10-CM | POA: Diagnosis present

## 2015-10-06 DIAGNOSIS — I10 Essential (primary) hypertension: Secondary | ICD-10-CM | POA: Diagnosis present

## 2015-10-06 DIAGNOSIS — I471 Supraventricular tachycardia: Secondary | ICD-10-CM | POA: Diagnosis not present

## 2015-10-06 DIAGNOSIS — G9340 Encephalopathy, unspecified: Secondary | ICD-10-CM

## 2015-10-06 DIAGNOSIS — G459 Transient cerebral ischemic attack, unspecified: Secondary | ICD-10-CM

## 2015-10-06 DIAGNOSIS — E876 Hypokalemia: Secondary | ICD-10-CM | POA: Diagnosis not present

## 2015-10-06 DIAGNOSIS — D649 Anemia, unspecified: Secondary | ICD-10-CM | POA: Diagnosis present

## 2015-10-06 DIAGNOSIS — I6320 Cerebral infarction due to unspecified occlusion or stenosis of unspecified precerebral arteries: Secondary | ICD-10-CM

## 2015-10-06 DIAGNOSIS — G8314 Monoplegia of lower limb affecting left nondominant side: Secondary | ICD-10-CM | POA: Diagnosis present

## 2015-10-06 DIAGNOSIS — I2510 Atherosclerotic heart disease of native coronary artery without angina pectoris: Secondary | ICD-10-CM | POA: Diagnosis not present

## 2015-10-06 DIAGNOSIS — I69393 Ataxia following cerebral infarction: Secondary | ICD-10-CM | POA: Diagnosis not present

## 2015-10-06 DIAGNOSIS — I160 Hypertensive urgency: Secondary | ICD-10-CM | POA: Diagnosis present

## 2015-10-06 DIAGNOSIS — I252 Old myocardial infarction: Secondary | ICD-10-CM

## 2015-10-06 DIAGNOSIS — I6523 Occlusion and stenosis of bilateral carotid arteries: Secondary | ICD-10-CM | POA: Diagnosis not present

## 2015-10-06 DIAGNOSIS — Z79899 Other long term (current) drug therapy: Secondary | ICD-10-CM

## 2015-10-06 DIAGNOSIS — I63331 Cerebral infarction due to thrombosis of right posterior cerebral artery: Secondary | ICD-10-CM | POA: Diagnosis not present

## 2015-10-06 DIAGNOSIS — Z9889 Other specified postprocedural states: Secondary | ICD-10-CM | POA: Diagnosis not present

## 2015-10-06 DIAGNOSIS — I129 Hypertensive chronic kidney disease with stage 1 through stage 4 chronic kidney disease, or unspecified chronic kidney disease: Secondary | ICD-10-CM | POA: Diagnosis present

## 2015-10-06 DIAGNOSIS — D62 Acute posthemorrhagic anemia: Secondary | ICD-10-CM | POA: Diagnosis present

## 2015-10-06 DIAGNOSIS — I6521 Occlusion and stenosis of right carotid artery: Secondary | ICD-10-CM | POA: Diagnosis not present

## 2015-10-06 DIAGNOSIS — I6529 Occlusion and stenosis of unspecified carotid artery: Secondary | ICD-10-CM | POA: Diagnosis present

## 2015-10-06 DIAGNOSIS — I672 Cerebral atherosclerosis: Secondary | ICD-10-CM | POA: Diagnosis not present

## 2015-10-06 DIAGNOSIS — I69354 Hemiplegia and hemiparesis following cerebral infarction affecting left non-dominant side: Secondary | ICD-10-CM | POA: Diagnosis not present

## 2015-10-06 DIAGNOSIS — R338 Other retention of urine: Secondary | ICD-10-CM | POA: Diagnosis not present

## 2015-10-06 DIAGNOSIS — N320 Bladder-neck obstruction: Secondary | ICD-10-CM | POA: Diagnosis not present

## 2015-10-06 DIAGNOSIS — E785 Hyperlipidemia, unspecified: Secondary | ICD-10-CM | POA: Diagnosis not present

## 2015-10-06 DIAGNOSIS — R29818 Other symptoms and signs involving the nervous system: Secondary | ICD-10-CM | POA: Diagnosis not present

## 2015-10-06 DIAGNOSIS — R030 Elevated blood-pressure reading, without diagnosis of hypertension: Secondary | ICD-10-CM | POA: Diagnosis not present

## 2015-10-06 DIAGNOSIS — R930 Abnormal findings on diagnostic imaging of skull and head, not elsewhere classified: Secondary | ICD-10-CM | POA: Diagnosis not present

## 2015-10-06 HISTORY — DX: Encounter for preprocedural cardiovascular examination: Z01.810

## 2015-10-06 HISTORY — DX: Prediabetes: R73.03

## 2015-10-06 LAB — DIFFERENTIAL
Basophils Absolute: 0 10*3/uL (ref 0.0–0.1)
Basophils Relative: 0 %
Eosinophils Absolute: 0 10*3/uL (ref 0.0–0.7)
Eosinophils Relative: 1 %
LYMPHS ABS: 1.4 10*3/uL (ref 0.7–4.0)
LYMPHS PCT: 32 %
MONOS PCT: 5 %
Monocytes Absolute: 0.2 10*3/uL (ref 0.1–1.0)
NEUTROS ABS: 2.9 10*3/uL (ref 1.7–7.7)
Neutrophils Relative %: 62 %

## 2015-10-06 LAB — CBC
HCT: 39.8 % (ref 39.0–52.0)
Hemoglobin: 12.6 g/dL — ABNORMAL LOW (ref 13.0–17.0)
MCH: 21.6 pg — AB (ref 26.0–34.0)
MCHC: 31.7 g/dL (ref 30.0–36.0)
MCV: 68.4 fL — AB (ref 78.0–100.0)
Platelets: 181 10*3/uL (ref 150–400)
RBC: 5.82 MIL/uL — ABNORMAL HIGH (ref 4.22–5.81)
RDW: 16 % — AB (ref 11.5–15.5)
WBC: 4.5 10*3/uL (ref 4.0–10.5)

## 2015-10-06 LAB — COMPREHENSIVE METABOLIC PANEL
ALK PHOS: 57 U/L (ref 38–126)
ALT: 12 U/L — AB (ref 17–63)
ANION GAP: 10 (ref 5–15)
AST: 22 U/L (ref 15–41)
Albumin: 3.5 g/dL (ref 3.5–5.0)
BUN: 14 mg/dL (ref 6–20)
CALCIUM: 9.1 mg/dL (ref 8.9–10.3)
CO2: 26 mmol/L (ref 22–32)
CREATININE: 1.1 mg/dL (ref 0.61–1.24)
Chloride: 102 mmol/L (ref 101–111)
Glucose, Bld: 76 mg/dL (ref 65–99)
Potassium: 3.5 mmol/L (ref 3.5–5.1)
Sodium: 138 mmol/L (ref 135–145)
TOTAL PROTEIN: 7.6 g/dL (ref 6.5–8.1)
Total Bilirubin: 0.5 mg/dL (ref 0.3–1.2)

## 2015-10-06 LAB — PROTIME-INR
INR: 1.13 (ref 0.00–1.49)
Prothrombin Time: 14.7 seconds (ref 11.6–15.2)

## 2015-10-06 LAB — I-STAT CHEM 8, ED
BUN: 18 mg/dL (ref 6–20)
CALCIUM ION: 1.17 mmol/L (ref 1.13–1.30)
CHLORIDE: 102 mmol/L (ref 101–111)
CREATININE: 1 mg/dL (ref 0.61–1.24)
Glucose, Bld: 78 mg/dL (ref 65–99)
HCT: 46 % (ref 39.0–52.0)
HEMOGLOBIN: 15.6 g/dL (ref 13.0–17.0)
Potassium: 3.6 mmol/L (ref 3.5–5.1)
Sodium: 142 mmol/L (ref 135–145)
TCO2: 27 mmol/L (ref 0–100)

## 2015-10-06 LAB — APTT: aPTT: 26 seconds (ref 24–37)

## 2015-10-06 LAB — CBG MONITORING, ED: Glucose-Capillary: 61 mg/dL — ABNORMAL LOW (ref 65–99)

## 2015-10-06 LAB — I-STAT TROPONIN, ED: Troponin i, poc: 0.02 ng/mL (ref 0.00–0.08)

## 2015-10-06 NOTE — ED Provider Notes (Signed)
CSN: [id number]     Arrival date & time 10/06/15  1414 History   First MD Initiated Contact with Patient 10/06/15 1625     Chief Complaint  Patient presents with  . Numbness  . Abdominal Pain     (Consider location/radiation/quality/duration/timing/severity/associated sxs/prior Treatment) HPI Comments: Patient is a 75 year old male with past medical history of hypertension, coronary artery disease, paroxysmal A. fib, prior MI. He presents today for evaluation of left leg weakness and numbness that started this morning. States that he having a hard time walking. His wife is also noticed that he is having slurred speech and she believes he has been "not quite right" since yesterday. He denies any headache or visual disturbances. He does report some intermittent abdominal discomfort but denies fever, diarrhea, or constipation.  Patient is a 75 y.o. male presenting with extremity weakness. The history is provided by the patient.  Extremity Weakness This is a new problem. Episode onset: This morning. The problem occurs constantly. The problem has not changed since onset.Pertinent negatives include no chest pain and no headaches. Nothing aggravates the symptoms. Nothing relieves the symptoms. He has tried nothing for the symptoms. The treatment provided no relief.    Past Medical History  Diagnosis Date  . Hypertension     echocardiogram 6/10: Moderate LVH, EF 55-65%, mild, mild MR, MAC, mild LAE, PASP 32  . Dyslipidemia   . History of stroke   . CAD (coronary artery disease)     cath in 1/08: EF 60%, mild dilated Ao root, oD2 30%, LAD 20-30%.  . Paroxysmal atrial fibrillation ([place])   . Anemia, iron deficiency 08/15/2011  . History of pneumonia 08/15/2011  . History of MI (myocardial infarction) 08/15/2011  . Dilated aortic root ([place]) 08/15/2011  . Stroke ([place]) 10/2011  . Myocardial infarction ([place])   . Hyperlipidemia   . Shortness of breath dyspnea    Past Surgical History  Procedure  Laterality Date  . Cardiac catheterization     Family History  Problem Relation Age of Onset  . Colon cancer Neg Hx   . Aneurysm Sister    Social History  Substance Use Topics  . Smoking status: Never Smoker   . Smokeless tobacco: Never Used  . Alcohol Use: No    Review of Systems  Cardiovascular: Negative for chest pain.  Musculoskeletal: Positive for extremity weakness.  Neurological: Negative for headaches.  All other systems reviewed and are negative.     Allergies  Review of patient's allergies indicates no known allergies.  Home Medications   Prior to Admission medications   Medication Sig Start Date End Date Taking? Authorizing Provider  albuterol (PROVENTIL HFA;VENTOLIN HFA) 108 (90 BASE) MCG/ACT inhaler Inhale 2 puffs into the lungs every 6 (six) hours as needed for wheezing or shortness of breath. 06/21/15   Thurnell Lose, MD  amitriptyline (ELAVIL) 50 MG tablet Take 1 tablet (50 mg total) by mouth at bedtime as needed for sleep. Patient not taking: Reported on 01/21/2015 06/26/14 06/26/15  Biagio Borg, MD  amLODipine (NORVASC) 10 MG tablet Take 1 tablet (10 mg total) by mouth daily. Patient not taking: Reported on 06/19/2015 01/21/15   Rosemarie Ax, MD  aspirin EC 81 MG tablet Take 1 tablet (81 mg total) by mouth daily. 06/21/15   Thurnell Lose, MD  atorvastatin (LIPITOR) 80 MG tablet Take 1 tablet (80 mg total) by mouth daily. Patient not taking: Reported on 01/21/2015 06/27/14   Biagio Borg, MD  carvedilol (  COREG) 3.125 MG tablet Take 1 tablet (3.125 mg total) by mouth 2 (two) times daily with a meal. 09/26/15   Biagio Borg, MD  furosemide (LASIX) 20 MG tablet Take 1 tablet (20 mg total) by mouth daily. 06/21/15   Thurnell Lose, MD  isosorbide mononitrate (IMDUR) 30 MG 24 hr tablet Take 1 tablet (30 mg total) by mouth daily. 06/21/15   Thurnell Lose, MD  lisinopril (PRINIVIL,ZESTRIL) 5 MG tablet Take 1 tablet (5 mg total) by mouth daily. 09/26/15    Biagio Borg, MD  tadalafil (CIALIS) 20 MG tablet Take 1 tablet (20 mg total) by mouth daily as needed for erectile dysfunction. Patient not taking: Reported on 01/21/2015 06/26/14   Biagio Borg, MD   BP 157/91 mmHg  Pulse 82  Temp(Src) 98.5 F (36.9 C) (Oral)  Resp 18  SpO2 100% Physical Exam  Constitutional: He is oriented to person, place, and time. He appears well-developed and well-nourished. No distress.  HENT:  Head: Normocephalic and atraumatic.  Eyes: EOM are normal. Pupils are equal, round, and reactive to light.  Neck: Normal range of motion. Neck supple.  Cardiovascular: Normal rate, regular rhythm and normal heart sounds.   No murmur heard. Pulmonary/Chest: Effort normal and breath sounds normal. No respiratory distress. He has no wheezes. He has no rales.  Abdominal: Soft. Bowel sounds are normal. He exhibits no distension. There is no tenderness. There is no rebound.  Musculoskeletal: Normal range of motion. He exhibits no edema.  Neurological: He is alert and oriented to person, place, and time. No cranial nerve deficit. He exhibits normal muscle tone.  Patient is awake and alert. He is slow to respond to questions and commands, but does respond appropriately. Strength is 5 out of 5 in all 4 extremities. He is able to ambulate, however does have somewhat of a slow gait.  Skin: Skin is warm and dry. He is not diaphoretic.  Nursing note and vitals reviewed.   ED Course  Procedures (including critical care time) Labs Review Labs Reviewed  CBC - Abnormal; Notable for the following:    RBC 5.82 (*)    Hemoglobin 12.6 (*)    MCV 68.4 (*)    MCH 21.6 (*)    RDW 16.0 (*)    All other components within normal limits  COMPREHENSIVE METABOLIC PANEL - Abnormal; Notable for the following:    ALT 12 (*)    All other components within normal limits  PROTIME-INR  APTT  DIFFERENTIAL  I-STAT TROPOININ, ED  I-STAT CHEM 8, ED  CBG MONITORING, ED    Imaging Review No  results found. I have personally reviewed and evaluated these images and lab results as part of my medical decision-making.   EKG Interpretation   Date/Time:  Sunday October 06 2015 15:06:32 EST Ventricular Rate:  74 PR Interval:  182 QRS Duration: 92 QT Interval:  406 QTC Calculation: 450 R Axis:   -20 Text Interpretation:  Normal sinus rhythm Left ventricular hypertrophy ST  \T\ T wave abnormality, consider inferolateral ischemia Abnormal ECG  Confirmed by Nikolaus Pienta  MD, Latorie Montesano ([id number]) on 10/06/2015 4:46:09 PM      MDM   Final diagnoses:  None    Patient presents with left leg numbness and weakness since yesterday along with slurred speech (per wife). Initial head CT shows no acute finding, however MRI does reveal patchy ischemic nonhemorrhagic infarcts involving the cortical grey matter of the right frontal, right parietal, and right occipital lobes.  I've spoken with Dr.Opyd from the hospitalist service who agrees to admit.  CRITICAL CARE Performed by: Veryl Speak Total critical care time: 30 minutes Critical care time was exclusive of separately billable procedures and treating other patients. Critical care was necessary to treat or prevent imminent or life-threatening deterioration. Critical care was time spent personally by me on the following activities: development of treatment plan with patient and/or surrogate as well as nursing, discussions with consultants, evaluation of patient's response to treatment, examination of patient, obtaining history from patient or surrogate, ordering and performing treatments and interventions, ordering and review of laboratory studies, ordering and review of radiographic studies, pulse oximetry and re-evaluation of patient's condition.     Veryl Speak, MD 10/06/15 [phone]

## 2015-10-06 NOTE — ED Notes (Signed)
CBG 61. 

## 2015-10-06 NOTE — ED Notes (Signed)
Patient transported to MRI 

## 2015-10-06 NOTE — ED Notes (Signed)
Pt reports recent mid abd pains intermittent for days. Also reports unable to walk due to left leg "giving out" bc its feeling numb since yesterday. Denies numbness in left arm.

## 2015-10-07 ENCOUNTER — Inpatient Hospital Stay (HOSPITAL_COMMUNITY): Payer: Medicare Other

## 2015-10-07 ENCOUNTER — Encounter (HOSPITAL_COMMUNITY): Payer: Self-pay | Admitting: Family Medicine

## 2015-10-07 DIAGNOSIS — I639 Cerebral infarction, unspecified: Secondary | ICD-10-CM | POA: Diagnosis present

## 2015-10-07 DIAGNOSIS — I6789 Other cerebrovascular disease: Secondary | ICD-10-CM

## 2015-10-07 LAB — LIPID PANEL
CHOLESTEROL: 262 mg/dL — AB (ref 0–200)
HDL: 32 mg/dL — ABNORMAL LOW (ref >40)
LDL CALC: 206 mg/dL — AB (ref 0–99)
TRIGLYCERIDES: 118 mg/dL (ref <150)
Total CHOL/HDL Ratio: 8.2 RATIO
VLDL: 24 mg/dL (ref 0–40)

## 2015-10-07 MED ORDER — SODIUM CHLORIDE 0.9% FLUSH
3.0000 mL | INTRAVENOUS | Status: DC | PRN
  Administered 2015-10-12: 3 mL via INTRAVENOUS
  Filled 2015-10-07: qty 3

## 2015-10-07 MED ORDER — ENOXAPARIN SODIUM 40 MG/0.4ML ~~LOC~~ SOLN
40.0000 mg | Freq: Every day | SUBCUTANEOUS | Status: DC
  Filled 2015-10-07: qty 0.4

## 2015-10-07 MED ORDER — ALBUTEROL SULFATE (2.5 MG/3ML) 0.083% IN NEBU: 2.5000 mg | INHALATION_SOLUTION | Freq: Four times a day (QID) | RESPIRATORY_TRACT | Status: DC | PRN

## 2015-10-07 MED ORDER — SODIUM CHLORIDE 0.9% FLUSH
3.0000 mL | Freq: Two times a day (BID) | INTRAVENOUS | Status: DC
  Administered 2015-10-07 – 2015-10-14 (×14): 3 mL via INTRAVENOUS

## 2015-10-07 MED ORDER — ASPIRIN 300 MG RE SUPP: 300.0000 mg | Freq: Every day | RECTAL | Status: DC

## 2015-10-07 MED ORDER — ATORVASTATIN CALCIUM 80 MG PO TABS
80.0000 mg | ORAL_TABLET | Freq: Every day | ORAL | Status: DC
  Administered 2015-10-07 – 2015-10-14 (×7): 80 mg via ORAL
  Filled 2015-10-07 (×7): qty 1

## 2015-10-07 MED ORDER — LABETALOL HCL 5 MG/ML IV SOLN: 10.0000 mg | INTRAVENOUS | Status: DC | PRN

## 2015-10-07 MED ORDER — SODIUM CHLORIDE 0.9 % IV SOLN: 250.0000 mL | INTRAVENOUS | Status: DC | PRN

## 2015-10-07 MED ORDER — SENNOSIDES-DOCUSATE SODIUM 8.6-50 MG PO TABS: 1.0000 | ORAL_TABLET | Freq: Every evening | ORAL | Status: DC | PRN

## 2015-10-07 MED ORDER — STROKE: EARLY STAGES OF RECOVERY BOOK
Freq: Once | Status: AC
  Administered 2015-10-07: 05:00:00
  Filled 2015-10-07 (×2): qty 1

## 2015-10-07 MED ORDER — ASPIRIN 325 MG PO TABS
325.0000 mg | ORAL_TABLET | Freq: Every day | ORAL | Status: DC
  Administered 2015-10-07 – 2015-10-11 (×4): 325 mg via ORAL
  Filled 2015-10-07 (×5): qty 1

## 2015-10-07 MED ORDER — IOHEXOL 350 MG/ML SOLN
50.0000 mL | Freq: Once | INTRAVENOUS | Status: AC | PRN
  Administered 2015-10-07: 50 mL via INTRAVENOUS

## 2015-10-07 MED ORDER — FUROSEMIDE 20 MG PO TABS
20.0000 mg | ORAL_TABLET | Freq: Every day | ORAL | Status: DC
  Administered 2015-10-07 – 2015-10-14 (×7): 20 mg via ORAL
  Filled 2015-10-07 (×7): qty 1

## 2015-10-07 MED ORDER — LABETALOL HCL 5 MG/ML IV SOLN
5.0000 mg | INTRAVENOUS | Status: DC | PRN
  Administered 2015-10-07 – 2015-10-11 (×3): 5 mg via INTRAVENOUS
  Filled 2015-10-07 (×4): qty 4

## 2015-10-07 MED ORDER — ENOXAPARIN SODIUM 40 MG/0.4ML ~~LOC~~ SOLN
40.0000 mg | Freq: Every day | SUBCUTANEOUS | Status: DC
  Administered 2015-10-07: 40 mg via SUBCUTANEOUS
  Filled 2015-10-07: qty 0.4

## 2015-10-07 NOTE — Progress Notes (Signed)
Patient admitted from  E.D to room 5M15 oriented to room and unit Telemetry called and verified patient and box MD admitting pagged.

## 2015-10-07 NOTE — Progress Notes (Signed)
STROKE TEAM PROGRESS NOTE   HISTORY OF PRESENT ILLNESS [name] is a 75 y.o. male a history of hypertension, stroke, myocardial infarction, atrial fibrillation. He had atrial fibrillation during a hospitalization. It was noted in a note from 2013 and it was stated that he was not started on anticoagulation due to anemia. He had a colonoscopy with internal hemorrhoids. He states that he has been having difficulty with his leg since Friday. His LKW was Friday 10/04/2015, unclear time. Premorbid modified rankin scale: 1. Patient was not administered IV t-PA secondary to being out of the window. He was admitted for further evaluation and treatment.   SUBJECTIVE (INTERVAL HISTORY) His best friend is at the bedside.  Overall he feels his condition is stable.    OBJECTIVE Temp:  [98 F (36.7 C)-98.5 F (36.9 C)] 98 F (36.7 C) (03/06 1209) Pulse Rate:  [61-85] 65 (03/06 1209) Cardiac Rhythm:  [-] Normal sinus rhythm (03/06 0700) Resp:  [11-23] 18 (03/06 1209) BP: (157-223)/(71-138) 189/93 mmHg (03/06 1209) SpO2:  [96 %-100 %] 100 % (03/06 1209) Weight:  [76.386 kg (168 lb 6.4 oz)] 76.386 kg (168 lb 6.4 oz) (03/06 0028)  CBC:   Recent Labs Lab 10/06/15 1542 10/06/15 1547  WBC 4.5  --   NEUTROABS 2.9  --   HGB 12.6* 15.6  HCT 39.8 46.0  MCV 68.4*  --   PLT 181  --     Basic Metabolic Panel:   Recent Labs Lab 10/06/15 1542 10/06/15 1547  NA 138 142  K 3.5 3.6  CL 102 102  CO2 26  --   GLUCOSE 76 78  BUN 14 18  CREATININE 1.10 1.00  CALCIUM 9.1  --     Lipid Panel:     Component Value Date/Time   CHOL 262* 10/07/2015 1114   TRIG 118 10/07/2015 1114   HDL 32* 10/07/2015 1114   CHOLHDL 8.2 10/07/2015 1114   VLDL 24 10/07/2015 1114   LDLCALC 206* 10/07/2015 1114   HgbA1c: No results found for: HGBA1C Urine Drug Screen:     Component Value Date/Time   LABOPIA NEGATIVE 01/01/2009 0600   COCAINSCRNUR NEGATIVE 01/01/2009 0600   LABBENZ NEGATIVE 01/01/2009 0600    AMPHETMU NEGATIVE 01/01/2009 0600      IMAGING  Ct Head Wo Contrast 10/06/2015  1.  No acute intracranial abnormality. 2.  Cerebral atrophy and small vessel ischemic change. 3. Sinus disease.    Ct Angio Head & Neck W/cm &/or Wo/cm 10/07/2015  1. Radiographic String Sign Stenosis at the right ICA origin related to Low-density plaque or thrombus, however, there is superimposed severe intracranial atherosclerosis including high-grade distal right ICA stenosis in part due to bulky calcified plaque. 2. Widespread intracranial moderate to severe first and second order vessel irregularity and stenosis, including severe stenoses at the: -bilateral distal vertebral arteries, - proximal basilar artery, - bilateral PCAs, - right ACA A1, - bilateral ACA distal A2 segments. 3. No intracranial hemorrhage or mass effect.   Mr Brain Wo Contrast 10/06/2015  1. Patchy acute ischemic nonhemorrhagic infarcts involving the cortical gray matter and deep white matter of the right frontal, right parietal, and right occipital lobes. No associated mass effect. 2. Subtle abnormal flow void within the distal right ICA, which may reflect underlying atheromatous disease, slow flow, and/or partial occlusion. 3. Generalized cerebral atrophy with moderate chronic small vessel ischemic disease. 4. Mild paranasal sinus disease as above.   2D Echocardiogram  - Left ventricle: The cavity size  was normal. Wall thickness wasincreased in a pattern of severe LVH. Systolic function wasnormal. The estimated ejection fraction was in the range of 55%to 60%. Left ventricular diastolic function parameters werenormal. - Aortic valve: There was mild regurgitation. - Mitral valve: There was mild regurgitation. - Left atrium: The atrium was moderately dilated.   PHYSICAL EXAM Pleasant elderly Caucasian male currently not in distress. . Afebrile. Head is nontraumatic. Neck is supple without bruit.    Cardiac exam no murmur or gallop. Lungs  are clear to auscultation. Distal pulses are well felt. Neurological Exam :  Awake alert oriented x 3 normal speech and language. Mild left lower face asymmetry. Tongue midline. No drift. Mild diminished fine finger movements on left. Orbits right over left upper extremity. Mild left grip weak.. Normal sensation . Normal coordination. ASSESSMENT/PLAN [name] is a 75 y.o. male with history of hypertension, stroke, myocardial infarction, atrial fibrillation presenting with left leg weakness. He did not receive IV t-PA due to delay in arrival.   Stroke:  patchy right frontal, right parietal and right occipital infarcts, likely secondary to large vessel disease source  MRI  patchy right frontal, right parietal and right occipital infarcts  CTA angio head and neck right ICA origin string sign with superimposed severe intracranial atherosclerosis including high-grade right ICA stenosis due to bulky calcified plaque.  Cerebral angiogram to better evaluate intracranial stenosis  2D Echo  No source of embolus   LDL 206  HgbA1c pending  Lovenox 40 mg sq daily for VTE prophylaxis Diet Heart Room service appropriate?: Yes; Fluid consistency:: Thin  aspirin 81 mg daily prior to admission, now on aspirin 325 mg daily  Patient counseled to be compliant with his antithrombotic medications  Ongoing aggressive stroke risk factor management  Therapy recommendations:  pending   Disposition:  pending   Hypertension  Elevate, but acceptable Permissive hypertension (OK if < 220/120) but gradually normalize in 5-7 days  Hyperlipidemia  Home meds:  Lipitor 80, resumed in hospital  LDL 206, goal < 70  Continue statin at discharge  Other Stroke Risk Factors  Advanced age  Hx stroke/TIA  11/2011 - subacute stroke found on evaluation of dizziness. Coumadin recommended Seen by neurology ([facility])  Coronary artery disease  Other Active Problems  Chronic kidney disease stage  III  BPH  Hospital day # [place] for Pager information 10/07/2015 5:00 PM  I have personally examined this patient, reviewed notes, independently viewed imaging studies, participated in medical decision making and plan of care. I have made any additions or clarifications directly to the above note. Agree with note above. Patient has patchy right MCA infarct likely distal embolization from the right cavernous carotid or proximal right internal carotid artery stenosis which appears to be high-grade. She remains at risk for neurological worsening, recurrent stroke and TIAs and needs ongoing stroke evaluation and aggressive risk factor control. Recommend check diagnostic cerebral catheter angiogram to see if patient has a lesion on the right carotid which isn't amenable to revascularization.  Antony Contras, MD Medical Director [facility] Pager: [phone] 10/07/2015 6:30 PM    To contact Stroke Continuity provider, please refer to [email or web address] After hours, contact General Neurology

## 2015-10-07 NOTE — Consult Note (Signed)
Chief Complaint: infarcts of right ICA distribution   Referring Physician:Dr. Roland Rack  Supervising Physician: Dr. Luanne Bras  HPI: [name] is an 75 y.o. male who began to noticed some left leg weakness when walking Friday on his way to the cleaners.  His leg would almost give out from underneath him.  He essentially had no other symptoms except for some left lateral leg paresthesias.  He was brought to the hospital yesterday and admitted after an MRI showed a patchy ischemic infarct of the right frontal, parietal, and occipital lobes.  There was also some abnormal flow within the distal right ICA.  We have been asked to see the patient for a cerebral arteriogram to have a better look at anatomy.  Past Medical History:  Past Medical History  Diagnosis Date  . Hypertension     echocardiogram 6/10: Moderate LVH, EF 55-65%, mild, mild MR, MAC, mild LAE, PASP 32  . Dyslipidemia   . History of stroke   . CAD (coronary artery disease)     cath in 1/08: EF 60%, mild dilated Ao root, oD2 30%, LAD 20-30%.  . Paroxysmal atrial fibrillation ([place])   . Anemia, iron deficiency 08/15/2011  . History of pneumonia 08/15/2011  . History of MI (myocardial infarction) 08/15/2011  . Dilated aortic root ([place]) 08/15/2011  . Stroke ([place]) 10/2011  . Myocardial infarction ([place])   . Hyperlipidemia   . Shortness of breath dyspnea     Past Surgical History:  Past Surgical History  Procedure Laterality Date  . Cardiac catheterization      Family History:  Family History  Problem Relation Age of Onset  . Colon cancer Neg Hx   . Aneurysm Sister     Social History:  reports that he has never smoked. He has never used smokeless tobacco. He reports that he does not drink alcohol or use illicit drugs.  Allergies: No Known Allergies  Medications:   Medication List    ASK your doctor about these medications        albuterol 108 (90 Base) MCG/ACT inhaler  Commonly known as:   PROVENTIL HFA;VENTOLIN HFA  Inhale 2 puffs into the lungs every 6 (six) hours as needed for wheezing or shortness of breath.     amitriptyline 50 MG tablet  Commonly known as:  ELAVIL  Take 1 tablet (50 mg total) by mouth at bedtime as needed for sleep.     amLODipine 10 MG tablet  Commonly known as:  NORVASC  Take 1 tablet (10 mg total) by mouth daily.     aspirin EC 81 MG tablet  Take 1 tablet (81 mg total) by mouth daily.     atorvastatin 80 MG tablet  Commonly known as:  LIPITOR  Take 1 tablet (80 mg total) by mouth daily.     carvedilol 3.125 MG tablet  Commonly known as:  COREG  Take 1 tablet (3.125 mg total) by mouth 2 (two) times daily with a meal.     furosemide 20 MG tablet  Commonly known as:  LASIX  Take 1 tablet (20 mg total) by mouth daily.     isosorbide mononitrate 30 MG 24 hr tablet  Commonly known as:  IMDUR  Take 1 tablet (30 mg total) by mouth daily.     lisinopril 5 MG tablet  Commonly known as:  PRINIVIL,ZESTRIL  Take 1 tablet (5 mg total) by mouth daily.     tadalafil 20 MG tablet  Commonly known  as:  CIALIS  Take 1 tablet (20 mg total) by mouth daily as needed for erectile dysfunction.        Please HPI for pertinent positives, otherwise complete 10 system ROS negative.  Mallampati Score: MD Evaluation Airway: WNL Heart: WNL Abdomen: WNL Chest/ Lungs: WNL ASA  Classification: 3 Mallampati/Airway Score: Two  Physical Exam: BP 189/93 mmHg  Pulse 65  Temp(Src) 98 F (36.7 C) (Oral)  Resp 18  Ht 5' 6" (1.676 m)  Wt 168 lb 6.4 oz (76.386 kg)  BMI 27.19 kg/m2  SpO2 100% Body mass index is 27.19 kg/(m^2). General: pleasant, WD, WN black male who is sitting up in his chair in NAD HEENT: head is normocephalic, atraumatic.  Sclera are noninjected.  PERRL.  Ears and nose without any masses or lesions.  Mouth is pink and moist Heart: regular, rate, and rhythm.  Normal s1,s2. No obvious murmurs, gallops, or rubs noted.  Palpable radial and  pedal pulses bilaterally Lungs: CTAB, no wheezes, rhonchi, or rales noted.  Respiratory effort nonlabored Abd: soft, NT, ND, +BS, no masses, hernias, or organomegaly MS: all 4 extremities are symmetrical with no cyanosis, clubbing, or edema. Neuro: Cranial nerves II-XII grossly intact, 5/5 strength in all 4 extremities.  Good sensation in all extremities.  Psych: A&Ox3 with an appropriate affect.   Labs: Results for orders placed or performed during the hospital encounter of 10/06/15 (from the past 48 hour(s))  Protime-INR     Status: None   Collection Time: 10/06/15  3:42 PM  Result Value Ref Range   Prothrombin Time 14.7 11.6 - 15.2 seconds   INR 1.13 0.00 - 1.49  APTT     Status: None   Collection Time: 10/06/15  3:42 PM  Result Value Ref Range   aPTT 26 24 - 37 seconds  CBC     Status: Abnormal   Collection Time: 10/06/15  3:42 PM  Result Value Ref Range   WBC 4.5 4.0 - 10.5 K/uL   RBC 5.82 (H) 4.22 - 5.81 MIL/uL   Hemoglobin 12.6 (L) 13.0 - 17.0 g/dL   HCT 39.8 39.0 - 52.0 %   MCV 68.4 (L) 78.0 - 100.0 fL   MCH 21.6 (L) 26.0 - 34.0 pg   MCHC 31.7 30.0 - 36.0 g/dL   RDW 16.0 (H) 11.5 - 15.5 %   Platelets 181 150 - 400 K/uL  Differential     Status: None   Collection Time: 10/06/15  3:42 PM  Result Value Ref Range   Neutrophils Relative % 62 %   Lymphocytes Relative 32 %   Monocytes Relative 5 %   Eosinophils Relative 1 %   Basophils Relative 0 %   Neutro Abs 2.9 1.7 - 7.7 K/uL   Lymphs Abs 1.4 0.7 - 4.0 K/uL   Monocytes Absolute 0.2 0.1 - 1.0 K/uL   Eosinophils Absolute 0.0 0.0 - 0.7 K/uL   Basophils Absolute 0.0 0.0 - 0.1 K/uL  Comprehensive metabolic panel     Status: Abnormal   Collection Time: 10/06/15  3:42 PM  Result Value Ref Range   Sodium 138 135 - 145 mmol/L   Potassium 3.5 3.5 - 5.1 mmol/L   Chloride 102 101 - 111 mmol/L   CO2 26 22 - 32 mmol/L   Glucose, Bld 76 65 - 99 mg/dL   BUN 14 6 - 20 mg/dL   Creatinine, Ser 1.10 0.61 - 1.24 mg/dL   Calcium  9.1 8.9 - 10.3 mg/dL   Total  Protein 7.6 6.5 - 8.1 g/dL   Albumin 3.5 3.5 - 5.0 g/dL   AST 22 15 - 41 U/L   ALT 12 (L) 17 - 63 U/L   Alkaline Phosphatase 57 38 - 126 U/L   Total Bilirubin 0.5 0.3 - 1.2 mg/dL   GFR calc non Af Amer >60 >60 mL/min   GFR calc Af Amer >60 >60 mL/min    Comment: (NOTE) The eGFR has been calculated using the CKD EPI equation. This calculation has not been validated in all clinical situations. eGFR's persistently <60 mL/min signify possible Chronic Kidney Disease.    Anion gap 10 5 - 15  I-stat troponin, ED (not at [facility], [facility])     Status: None   Collection Time: 10/06/15  3:45 PM  Result Value Ref Range   Troponin i, poc 0.02 0.00 - 0.08 ng/mL   Comment 3            Comment: Due to the release kinetics of cTnI, a negative result within the first hours of the onset of symptoms does not rule out myocardial infarction with certainty. If myocardial infarction is still suspected, repeat the test at appropriate intervals.   I-Stat Chem 8, ED  (not at [facility], [facility])     Status: None   Collection Time: 10/06/15  3:47 PM  Result Value Ref Range   Sodium 142 135 - 145 mmol/L   Potassium 3.6 3.5 - 5.1 mmol/L   Chloride 102 101 - 111 mmol/L   BUN 18 6 - 20 mg/dL   Creatinine, Ser 1.00 0.61 - 1.24 mg/dL   Glucose, Bld 78 65 - 99 mg/dL   Calcium, Ion 1.17 1.13 - 1.30 mmol/L   TCO2 27 0 - 100 mmol/L   Hemoglobin 15.6 13.0 - 17.0 g/dL   HCT 46.0 39.0 - 52.0 %  CBG monitoring, ED     Status: Abnormal   Collection Time: 10/06/15  4:46 PM  Result Value Ref Range   Glucose-Capillary 61 (L) 65 - 99 mg/dL   Comment 1 Notify RN    Comment 2 Document in Chart   Lipid panel     Status: Abnormal   Collection Time: 10/07/15 11:14 AM  Result Value Ref Range   Cholesterol 262 (H) 0 - 200 mg/dL   Triglycerides 118 <150 mg/dL   HDL 32 (L) >40 mg/dL   Total CHOL/HDL Ratio 8.2 RATIO   VLDL 24 0 - 40 mg/dL   LDL Cholesterol 206 (H) 0 - 99 mg/dL    Comment:        Total  Cholesterol/HDL:CHD Risk Coronary Heart Disease Risk Table                     Men   Women  1/2 Average Risk   3.4   3.3  Average Risk       5.0   4.4  2 X Average Risk   9.6   7.1  3 X Average Risk  23.4   11.0        Use the calculated Patient Ratio above and the CHD Risk Table to determine the patient's CHD Risk.        ATP III CLASSIFICATION (LDL):  <100     mg/dL   Optimal  100-129  mg/dL   Near or Above                    Optimal  130-159  mg/dL   Borderline  160-189  mg/dL   High  >190     mg/dL   Very High     Imaging: Ct Angio Head W/cm &/or Wo Cm  10/07/2015  CLINICAL DATA:  75 year old male with acute right hemisphere infarcts, mostly right MCA territory, following presentation for left lower extremity weakness. Initial encounter. EXAM: CT ANGIOGRAPHY HEAD AND NECK TECHNIQUE: Multidetector CT imaging of the head and neck was performed using the standard protocol during bolus administration of intravenous contrast. Multiplanar CT image reconstructions and MIPs were obtained to evaluate the vascular anatomy. Carotid stenosis measurements (when applicable) are obtained utilizing NASCET criteria, using the distal internal carotid diameter as the denominator. CONTRAST:  67m OMNIPAQUE IOHEXOL 350 MG/ML SOLN COMPARISON:  Brain MRI 10/06/2015 and earlier, including intracranial MRA a 11/20/2011. FINDINGS: CTA NECK Skeleton: Intermittent poor dentition. Widespread chronic cervical spine disc and endplate degeneration. No acute osseous abnormality identified. Mild paranasal sinus mucosal thickening. That in the left maxillary sinus might be associated with a small supernumerary tooth or tooth fragment (series 6, image 76). Tympanic cavities and mastoids appear clear. Other neck: Negative lung apices. No superior mediastinal lymphadenopathy. Thyroid, larynx, pharynx, parapharyngeal spaces, retropharyngeal space, sublingual space, submandibular glands, and parotid glands are within normal  limits. Visualized orbits and scalp soft tissues are within normal limits. No cervical lymphadenopathy. Aortic arch: Bovine arch configuration. Mild to moderate arch atherosclerosis, with both soft and calcified plaque. Great vessel dolichoectasia and tortuosity. Right carotid system: No brachiocephalic artery or right CCA origin stenosis. Tortuous proximal right CCA. Soft and calcified plaque at the right carotid bifurcation with high-grade string sign stenosis, with low-density plaque affecting the proximal 10 mm of the vessel. See sagittal image 57 of series 10. Despite this the right ICA remains patent. The vessel is mildly tortuous with a partially retropharyngeal course. Left carotid system: Bovine left CCA origin without stenosis. Mild calcified plaque at the left carotid bifurcation, widely patent left ICA origin and bulb. Tortuous but otherwise negative cervical left ICA, with a partially retropharyngeal course. Vertebral arteries:No proximal right subclavian artery stenosis despite calcified plaque. Calcified plaque near the right vertebral artery origin but without origin stenosis. However, there is soft plaque affecting the right V1 segment with moderate stenosis best seen on series 9, image 144. Occasionally tortuous right vertebral the 2 segment, and occasional calcified plaque but no other cervical right vertebral artery stenosis. No proximal left subclavian artery stenosis. Mildly dominant left vertebral artery with no origin stenosis despite soft plaque. V3 segment calcified plaque but no cervical left vertebral artery stenosis. CTA HEAD Posterior circulation: Bilateral calcified plaque affecting the vertebral arteries at the skullbase, however, a moderate to severe bilateral distal vertebral artery stenosis occurs due to soft plaque. Subsequent high-grade stenosis in both distal vertebral arteries and at the vertebrobasilar junction. Moderately to severely irregular basilar artery with additional  tandem stenoses. However, the basilar remains patent. Severe irregularity and stenosis at both PCA origins and also affecting the SCA origins to a lesser extent. Posterior communicating arteries might be present but are diminutive and irregular. Bilateral PCA branches are severely irregular and stenotic, worse on the right. Anterior circulation: Proximal ICA siphons are patent without plaque. However, there is moderate to severe calcified plaque from the cavernous segments to the supraclinoid ICAs. On the right this results in high-grade supraclinoid ICA stenosis best seen on series 8, image 111. There is moderate stenosis on the left. There is additional moderate to severe distal right ICA/terminus stenosis related to calcified plaque  best seen on series 8, image 110. Despite these findings both carotid termini are patent. There is irregularity at both MCA and ACA origins which remain patent. Severe stenosis of the right A1. The anterior communicating artery is present. Moderate to severe bilateral A2 segment irregularity and multifocal stenoses, best seen on series 13, image 18. Left MCA origin is patent with mild irregularity. Mild left M1 irregularity, with moderate Left MCA bifurcation is patent multifocal proximal M2 branch irregularity and stenosis best seen on series 11, image 22. On the right the MCA origin is mildly stenotic. The M1 segment is mildly to moderately irregular. The right MCA bifurcation is. There is moderate multifocal irregularity and stenosis in the dominant anterior M2 branch with attenuated anterior division MCA enhancement. Mild posterior division vessel irregularity. Venous sinuses: Patent. Anatomic variants: Bovine arch configuration. Delayed phase: No abnormal enhancement identified. Recently seen scattered small right MCA infarcts remain largely occult on CT. No acute intracranial hemorrhage identified. No intracranial mass effect. IMPRESSION: 1. Radiographic String Sign Stenosis at  the right ICA origin related to Low-density plaque or thrombus, however, there is superimposed severe intracranial atherosclerosis including high-grade distal right ICA stenosis in part due to bulky calcified plaque. 2. Widespread intracranial moderate to severe first and second order vessel irregularity and stenosis, including severe stenoses at the: -bilateral distal vertebral arteries, - proximal basilar artery, - bilateral PCAs, - right ACA A1, - bilateral ACA distal A2 segments. 3. No intracranial hemorrhage or mass effect. Salient findings discussed by telephone with Neurology NP Burnetta Sabin on 10/07/2015 at 1142 hours. Electronically Signed   By: Genevie Ann M.D.   On: 10/07/2015 11:45   Ct Head Wo Contrast  10/06/2015  CLINICAL DATA:  Left leg weakness. History of left MCA territory infarct. EXAM: CT HEAD WITHOUT CONTRAST TECHNIQUE: Contiguous axial images were obtained from the base of the skull through the vertex without intravenous contrast. COMPARISON:  Brain MR 11/20/2011.  CT 11/19/2011. FINDINGS: Sinuses/Soft tissues: Mucosal thickening of left maxillary sinus ain ethmoid air cells. Clear mastoid air cells. Intracranial: Mild low density in the periventricular white matter likely related to small vessel disease. Cerebral and cerebellar atrophy. Vertebral and carotid atherosclerosis bilaterally. No mass lesion, hemorrhage, hydrocephalus, acute infarct, intra-axial, or extra-axial fluid collection. IMPRESSION: 1.  No acute intracranial abnormality. 2.  Cerebral atrophy and small vessel ischemic change. 3. Sinus disease. Electronically Signed   By: Abigail Miyamoto M.D.   On: 10/06/2015 17:15   Ct Angio Neck W/cm &/or Wo/cm  10/07/2015  CLINICAL DATA:  75 year old male with acute right hemisphere infarcts, mostly right MCA territory, following presentation for left lower extremity weakness. Initial encounter. EXAM: CT ANGIOGRAPHY HEAD AND NECK TECHNIQUE: Multidetector CT imaging of the head and neck was  performed using the standard protocol during bolus administration of intravenous contrast. Multiplanar CT image reconstructions and MIPs were obtained to evaluate the vascular anatomy. Carotid stenosis measurements (when applicable) are obtained utilizing NASCET criteria, using the distal internal carotid diameter as the denominator. CONTRAST:  63m OMNIPAQUE IOHEXOL 350 MG/ML SOLN COMPARISON:  Brain MRI 10/06/2015 and earlier, including intracranial MRA a 11/20/2011. FINDINGS: CTA NECK Skeleton: Intermittent poor dentition. Widespread chronic cervical spine disc and endplate degeneration. No acute osseous abnormality identified. Mild paranasal sinus mucosal thickening. That in the left maxillary sinus might be associated with a small supernumerary tooth or tooth fragment (series 6, image 76). Tympanic cavities and mastoids appear clear. Other neck: Negative lung apices. No superior mediastinal lymphadenopathy. Thyroid, larynx, pharynx,  parapharyngeal spaces, retropharyngeal space, sublingual space, submandibular glands, and parotid glands are within normal limits. Visualized orbits and scalp soft tissues are within normal limits. No cervical lymphadenopathy. Aortic arch: Bovine arch configuration. Mild to moderate arch atherosclerosis, with both soft and calcified plaque. Great vessel dolichoectasia and tortuosity. Right carotid system: No brachiocephalic artery or right CCA origin stenosis. Tortuous proximal right CCA. Soft and calcified plaque at the right carotid bifurcation with high-grade string sign stenosis, with low-density plaque affecting the proximal 10 mm of the vessel. See sagittal image 57 of series 10. Despite this the right ICA remains patent. The vessel is mildly tortuous with a partially retropharyngeal course. Left carotid system: Bovine left CCA origin without stenosis. Mild calcified plaque at the left carotid bifurcation, widely patent left ICA origin and bulb. Tortuous but otherwise negative  cervical left ICA, with a partially retropharyngeal course. Vertebral arteries:No proximal right subclavian artery stenosis despite calcified plaque. Calcified plaque near the right vertebral artery origin but without origin stenosis. However, there is soft plaque affecting the right V1 segment with moderate stenosis best seen on series 9, image 144. Occasionally tortuous right vertebral the 2 segment, and occasional calcified plaque but no other cervical right vertebral artery stenosis. No proximal left subclavian artery stenosis. Mildly dominant left vertebral artery with no origin stenosis despite soft plaque. V3 segment calcified plaque but no cervical left vertebral artery stenosis. CTA HEAD Posterior circulation: Bilateral calcified plaque affecting the vertebral arteries at the skullbase, however, a moderate to severe bilateral distal vertebral artery stenosis occurs due to soft plaque. Subsequent high-grade stenosis in both distal vertebral arteries and at the vertebrobasilar junction. Moderately to severely irregular basilar artery with additional tandem stenoses. However, the basilar remains patent. Severe irregularity and stenosis at both PCA origins and also affecting the SCA origins to a lesser extent. Posterior communicating arteries might be present but are diminutive and irregular. Bilateral PCA branches are severely irregular and stenotic, worse on the right. Anterior circulation: Proximal ICA siphons are patent without plaque. However, there is moderate to severe calcified plaque from the cavernous segments to the supraclinoid ICAs. On the right this results in high-grade supraclinoid ICA stenosis best seen on series 8, image 111. There is moderate stenosis on the left. There is additional moderate to severe distal right ICA/terminus stenosis related to calcified plaque best seen on series 8, image 110. Despite these findings both carotid termini are patent. There is irregularity at both MCA and  ACA origins which remain patent. Severe stenosis of the right A1. The anterior communicating artery is present. Moderate to severe bilateral A2 segment irregularity and multifocal stenoses, best seen on series 13, image 18. Left MCA origin is patent with mild irregularity. Mild left M1 irregularity, with moderate Left MCA bifurcation is patent multifocal proximal M2 branch irregularity and stenosis best seen on series 11, image 22. On the right the MCA origin is mildly stenotic. The M1 segment is mildly to moderately irregular. The right MCA bifurcation is. There is moderate multifocal irregularity and stenosis in the dominant anterior M2 branch with attenuated anterior division MCA enhancement. Mild posterior division vessel irregularity. Venous sinuses: Patent. Anatomic variants: Bovine arch configuration. Delayed phase: No abnormal enhancement identified. Recently seen scattered small right MCA infarcts remain largely occult on CT. No acute intracranial hemorrhage identified. No intracranial mass effect. IMPRESSION: 1. Radiographic String Sign Stenosis at the right ICA origin related to Low-density plaque or thrombus, however, there is superimposed severe intracranial atherosclerosis including high-grade distal right  ICA stenosis in part due to bulky calcified plaque. 2. Widespread intracranial moderate to severe first and second order vessel irregularity and stenosis, including severe stenoses at the: -bilateral distal vertebral arteries, - proximal basilar artery, - bilateral PCAs, - right ACA A1, - bilateral ACA distal A2 segments. 3. No intracranial hemorrhage or mass effect. Salient findings discussed by telephone with Neurology NP Burnetta Sabin on 10/07/2015 at 1142 hours. Electronically Signed   By: Genevie Ann M.D.   On: 10/07/2015 11:45   Mr Brain Wo Contrast  10/06/2015  CLINICAL DATA:  Initial evaluation for acute left leg weakness. EXAM: MRI HEAD WITHOUT CONTRAST TECHNIQUE: Multiplanar, multiecho pulse  sequences of the brain and surrounding structures were obtained without intravenous contrast. COMPARISON:  Prior CT from earlier the same day. FINDINGS: There are multi focal patchy areas of restricted diffusion involving the cortical gray matter and subcortical white matter of the right frontal and parietal lobes. Few scattered small foci of patchy ischemic infarcts present within the right occipital lobe as well. These primarily involve the cortical gray matter and deep white matter of the centrum semi ovale. No basal ganglia infarct. No associated hemorrhage or significant mass effect. No other infarct involving the left cerebral hemisphere or infratentorial brain. Circumferential FLAIR signal within the visualized distal cervical/petrous right ICA suggestive of possible slow flow and/or partial occlusion or atheromatous disease. Major intracranial vascular flow voids otherwise maintained. Diffuse prominence of the CSF containing spaces compatible with generalized cerebral atrophy. Patchy T2/FLAIR hyperintensity within the periventricular and deep white matter both cerebral hemispheres most consistent with chronic small vessel ischemic disease, moderate in nature. Small vessel type changes present within the pons. Few scattered small remote lacunar infarcts within the bilateral corona radiata. Small remote lacunar infarct within the left thalamus. No mass lesion, midline shift, or mass effect. No hydrocephalus. No extra-axial fluid collection. Major dural sinuses are grossly patent. Craniocervical junction within normal limits. Mild degenerative spondylolysis present within the upper cervical spine without significant stenosis. Pituitary gland normal.  No acute abnormality about the orbits. Scattered mucosal thickening within the paranasal sinuses. Small air-fluid level within the left maxillary sinuses. No mastoid effusion. Inner ear structures within normal limits. Bone marrow signal intensity normal. No scalp  soft tissue abnormality. IMPRESSION: 1. Patchy acute ischemic nonhemorrhagic infarcts involving the cortical gray matter and deep white matter of the right frontal, right parietal, and right occipital lobes. No associated mass effect. 2. Subtle abnormal flow void within the distal right ICA, which may reflect underlying atheromatous disease, slow flow, and/or partial occlusion. 3. Generalized cerebral atrophy with moderate chronic small vessel ischemic disease. 4. Mild paranasal sinus disease as above. Electronically Signed   By: Jeannine Boga M.D.   On: 10/06/2015 22:30    Assessment/Plan 1. CVA of right ICA distribution -d/w Dr. Estanislado Pandy.  Will plan on cerebral arteriogram tomorrow -NPO p MN, check labs in am.  PTT/PT/INR ok on 3/5. -hold Lovenox tomorrow as well. -consent signed and in the chart -Risks and Benefits discussed with the patient including, but not limited to bleeding, infection, vascular injury or contrast induced renal failure. All of the patient's questions were answered, patient is agreeable to proceed. Consent signed and in chart.  Thank you for this interesting consult.  I greatly enjoyed meeting ANTWON ROCHIN and look forward to participating in their care.  A copy of this report was sent to the requesting provider on this date.  Electronically Signed: Henreitta Cea 10/07/2015, 2:15 PM  I spent a total of 40 Minutes   in face to face in clinical consultation, greater than 50% of which was counseling/coordinating care for CVA, needs cerebral arteriogram

## 2015-10-07 NOTE — H&P (Signed)
[facility] History and Physical  KRIDAY SEMPER [id number] DOB: [birth year] DOA: 10/06/2015  Referring physician: ED physician PCP: Cathlean Cower, MD  Specialists: Dr. Fuller Plan (GI), Dr. Lia Foyer (cardiology)   Chief Complaint:  Left leg weakness and numbness   HPI: [name] is a 75 y.o. male with PMH of hypertension, hyperlipidemia, coronary artery disease, BPH, and paroxysmal atrial fibrillation who presents to the ED with 1 day of weakness and numbness involving the left leg. Patient notes that it for approximately 24 hours prior to his arrival, the left leg has been numb and weak, making it difficult to put his pants on this morning and ambulate. He also notes a vague sensation of "just not feeling quite right" for the past day. Patient denies any recent fever, chills, rhinorrhea, sore throat, or malaise. There's been no chest pain or palpitations. He denies change in vision or hearing, and denies loss of coordination or dizziness. He denies headache per se, but reports a strange "buzzing" sensation over the right parietal scalp that has since resolved.   In ED, patient was found to be afebrile, saturating well on room air, and with blood pressure in the 220/100 range. Noncontrast head CT was negative for any acute intracranial abnormality, CMP was unremarkable, and CBC notable for a mild anemia with MCV of 68. EKG features a normal sinus rhythm with LVH by voltage criteria and troponin is 0.02. Despite the negative head CT, concern persisted for a primary CNS lesion and EDP ordered brain MRI to evaluate. MRI demonstrates acute ischemic infarcts of the cortical gray and deep white matter of the right frontal, right parietal, and right occipital lobes. There is a subtle flow void noted in the distal right ICA concerning for stenosis. Neurology was consulted from the emergency department, recommending admission. Patient will be admitted to the hospitalist service for ongoing evaluation and  management of acute ischemic stroke with concern for critical stenosis in the right internal carotid artery.   Where does patient live?   At home    Can patient participate in ADLs?  Yes         Review of Systems:   General: no fevers, chills, sweats, weight change, poor appetite, or fatigue HEENT: no blurry vision, hearing changes or sore throat Pulm: no dyspnea, cough, or wheeze CV: no chest pain or palpitations Abd: no nausea, vomiting, abdominal pain, diarrhea, or constipation GU: no dysuria or hematuria. Difficulty initiating stream, difficulty in completely evacuating bladder Ext: no leg edema Neuro: no vision change or hearing loss. Focal weakness and numbness in left leg.  Skin: no rash, no wounds MSK: No muscle spasm, no deformity, no red, hot, or swollen joint Heme: No easy bruising or bleeding Travel history: No recent long distant travel    Allergy: No Known Allergies  Past Medical History  Diagnosis Date  . Hypertension     echocardiogram 6/10: Moderate LVH, EF 55-65%, mild, mild MR, MAC, mild LAE, PASP 32  . Dyslipidemia   . History of stroke   . CAD (coronary artery disease)     cath in 1/08: EF 60%, mild dilated Ao root, oD2 30%, LAD 20-30%.  . Paroxysmal atrial fibrillation ([place])   . Anemia, iron deficiency 08/15/2011  . History of pneumonia 08/15/2011  . History of MI (myocardial infarction) 08/15/2011  . Dilated aortic root ([place]) 08/15/2011  . Stroke ([place]) 10/2011  . Myocardial infarction ([place])   . Hyperlipidemia   . Shortness of breath dyspnea  Past Surgical History  Procedure Laterality Date  . Cardiac catheterization      Social History:  reports that he has never smoked. He has never used smokeless tobacco. He reports that he does not drink alcohol or use illicit drugs.  Family History:  Family History  Problem Relation Age of Onset  . Colon cancer Neg Hx   . Aneurysm Sister      Prior to Admission medications   Medication Sig Start Date  End Date Taking? Authorizing Provider  albuterol (PROVENTIL HFA;VENTOLIN HFA) 108 (90 BASE) MCG/ACT inhaler Inhale 2 puffs into the lungs every 6 (six) hours as needed for wheezing or shortness of breath. 06/21/15   Thurnell Lose, MD  amitriptyline (ELAVIL) 50 MG tablet Take 1 tablet (50 mg total) by mouth at bedtime as needed for sleep. Patient not taking: Reported on 01/21/2015 06/26/14 06/26/15  Biagio Borg, MD  amLODipine (NORVASC) 10 MG tablet Take 1 tablet (10 mg total) by mouth daily. Patient not taking: Reported on 06/19/2015 01/21/15   Rosemarie Ax, MD  aspirin EC 81 MG tablet Take 1 tablet (81 mg total) by mouth daily. 06/21/15   Thurnell Lose, MD  atorvastatin (LIPITOR) 80 MG tablet Take 1 tablet (80 mg total) by mouth daily. Patient not taking: Reported on 01/21/2015 06/27/14   Biagio Borg, MD  carvedilol (COREG) 3.125 MG tablet Take 1 tablet (3.125 mg total) by mouth 2 (two) times daily with a meal. 09/26/15   Biagio Borg, MD  furosemide (LASIX) 20 MG tablet Take 1 tablet (20 mg total) by mouth daily. 06/21/15   Thurnell Lose, MD  isosorbide mononitrate (IMDUR) 30 MG 24 hr tablet Take 1 tablet (30 mg total) by mouth daily. 06/21/15   Thurnell Lose, MD  lisinopril (PRINIVIL,ZESTRIL) 5 MG tablet Take 1 tablet (5 mg total) by mouth daily. 09/26/15   Biagio Borg, MD  tadalafil (CIALIS) 20 MG tablet Take 1 tablet (20 mg total) by mouth daily as needed for erectile dysfunction. Patient not taking: Reported on 01/21/2015 06/26/14   Biagio Borg, MD    Physical Exam: Filed Vitals:   10/06/15 2300 10/07/15 0028 10/07/15 0133 10/07/15 0230  BP: 207/95 213/138 188/90 173/82  Pulse: 68 85 74 70  Temp:  98.4 F (36.9 C) 98.4 F (36.9 C) 98 F (36.7 C)  TempSrc:  Oral Oral Oral  Resp: 18 20 20 18   Height:  5\' 6"  (1.676 m)    Weight:  76.386 kg (168 lb 6.4 oz)    SpO2: 99% 98% 98% 97%   General: Not in acute distress HEENT:       Eyes: PERRL, EOMI, no scleral icterus or  conjunctival pallor.       ENT: No discharge from the ears or nose, no pharyngeal ulcers.        Neck: No JVD, no bruit, no appreciable mass Heme: No cervical adenopathy, no pallor Cardiac: S1/S2, RRR, No murmurs, No gallops or rubs. Pulm: Good air movement bilaterally. No rales, wheezing, rhonchi or rubs. Abd: Soft, mild distension, nontender, no rebound pain or gaurding, BS present. Ext: No LE edema bilaterally. 2+DP/PT pulse bilaterally. Musculoskeletal: No gross deformity, no red, hot, swollen joints   Skin: No rashes or wounds on exposed surfaces  Neuro: Alert, oriented X3, cranial nerves II-XII grossly intact, muscle strength 5/5 in all extremities, sensation to light touch intact. Brachial reflex 2+ bilaterally. Knee reflex 2+ bilaterally. Negative Babinski's sign. Normal finger  to nose test. No focal findings Psych: Patient is not overtly psychotic, appropriate mood and affect  Labs on Admission:  Basic Metabolic Panel:  Recent Labs Lab 10/06/15 1542 10/06/15 1547  NA 138 142  K 3.5 3.6  CL 102 102  CO2 26  --   GLUCOSE 76 78  BUN 14 18  CREATININE 1.10 1.00  CALCIUM 9.1  --    Liver Function Tests:  Recent Labs Lab 10/06/15 1542  AST 22  ALT 12*  ALKPHOS 57  BILITOT 0.5  PROT 7.6  ALBUMIN 3.5   No results for input(s): LIPASE, AMYLASE in the last 168 hours. No results for input(s): AMMONIA in the last 168 hours. CBC:  Recent Labs Lab 10/06/15 1542 10/06/15 1547  WBC 4.5  --   NEUTROABS 2.9  --   HGB 12.6* 15.6  HCT 39.8 46.0  MCV 68.4*  --   PLT 181  --    Cardiac Enzymes: No results for input(s): CKTOTAL, CKMB, CKMBINDEX, TROPONINI in the last 168 hours.  BNP (last 3 results)  Recent Labs  06/19/15 1039  BNP 301.8*    ProBNP (last 3 results) No results for input(s): PROBNP in the last 8760 hours.  CBG:  Recent Labs Lab 10/06/15 1646  GLUCAP 61*    Radiological Exams on Admission: Ct Head Wo Contrast  10/06/2015  CLINICAL DATA:   Left leg weakness. History of left MCA territory infarct. EXAM: CT HEAD WITHOUT CONTRAST TECHNIQUE: Contiguous axial images were obtained from the base of the skull through the vertex without intravenous contrast. COMPARISON:  Brain MR 11/20/2011.  CT 11/19/2011. FINDINGS: Sinuses/Soft tissues: Mucosal thickening of left maxillary sinus ain ethmoid air cells. Clear mastoid air cells. Intracranial: Mild low density in the periventricular white matter likely related to small vessel disease. Cerebral and cerebellar atrophy. Vertebral and carotid atherosclerosis bilaterally. No mass lesion, hemorrhage, hydrocephalus, acute infarct, intra-axial, or extra-axial fluid collection. IMPRESSION: 1.  No acute intracranial abnormality. 2.  Cerebral atrophy and small vessel ischemic change. 3. Sinus disease. Electronically Signed   By: Abigail Miyamoto M.D.   On: 10/06/2015 17:15   Mr Brain Wo Contrast  10/06/2015  CLINICAL DATA:  Initial evaluation for acute left leg weakness. EXAM: MRI HEAD WITHOUT CONTRAST TECHNIQUE: Multiplanar, multiecho pulse sequences of the brain and surrounding structures were obtained without intravenous contrast. COMPARISON:  Prior CT from earlier the same day. FINDINGS: There are multi focal patchy areas of restricted diffusion involving the cortical gray matter and subcortical white matter of the right frontal and parietal lobes. Few scattered small foci of patchy ischemic infarcts present within the right occipital lobe as well. These primarily involve the cortical gray matter and deep white matter of the centrum semi ovale. No basal ganglia infarct. No associated hemorrhage or significant mass effect. No other infarct involving the left cerebral hemisphere or infratentorial brain. Circumferential FLAIR signal within the visualized distal cervical/petrous right ICA suggestive of possible slow flow and/or partial occlusion or atheromatous disease. Major intracranial vascular flow voids otherwise  maintained. Diffuse prominence of the CSF containing spaces compatible with generalized cerebral atrophy. Patchy T2/FLAIR hyperintensity within the periventricular and deep white matter both cerebral hemispheres most consistent with chronic small vessel ischemic disease, moderate in nature. Small vessel type changes present within the pons. Few scattered small remote lacunar infarcts within the bilateral corona radiata. Small remote lacunar infarct within the left thalamus. No mass lesion, midline shift, or mass effect. No hydrocephalus. No extra-axial fluid collection. Major  dural sinuses are grossly patent. Craniocervical junction within normal limits. Mild degenerative spondylolysis present within the upper cervical spine without significant stenosis. Pituitary gland normal.  No acute abnormality about the orbits. Scattered mucosal thickening within the paranasal sinuses. Small air-fluid level within the left maxillary sinuses. No mastoid effusion. Inner ear structures within normal limits. Bone marrow signal intensity normal. No scalp soft tissue abnormality. IMPRESSION: 1. Patchy acute ischemic nonhemorrhagic infarcts involving the cortical gray matter and deep white matter of the right frontal, right parietal, and right occipital lobes. No associated mass effect. 2. Subtle abnormal flow void within the distal right ICA, which may reflect underlying atheromatous disease, slow flow, and/or partial occlusion. 3. Generalized cerebral atrophy with moderate chronic small vessel ischemic disease. 4. Mild paranasal sinus disease as above. Electronically Signed   By: Jeannine Boga M.D.   On: 10/06/2015 22:30    EKG: Independently reviewed.  Abnormal findings:  NSR, LVH by voltage criteria, inferolateral TwI   Assessment/Plan  1. Acute ischemic stroke  - MRI findings c/w acute ischemic infarcts in Rt frontal, parietal, and occipital lobes - Flow void in distal Rt ICA noted on MRI, will be further  evaluated with CTA - Permissive HTN, treating SBP >220, or DBP >120 with 5mg  IVP labetalol - Fasting lipid panel and A1c pending  - Optimize RFs  - Prophylactic ASA 325 PO, or 300 PR  - CTA head and neck  - TTE - PT/OT/SLP - Neuro checks  - Neurology consultants much appreciated   2. Atrial fibrillation, paroxysmal  - In sinus currently  - Previously anticoagulated, stopped d/t anemia  - Monitor on telemetry  - TTE ordered  - Holding Coreg currently, allowing HTN for 24 hrs    3. CKD stage III - SCr 1.10 on admission, best it has been in recent yrs  - Continue to monitor    4. Hypertension  - BP up to 230/120 range on the floor  - Holding home Coreg, Norvasc, lisinopril while in acute phase of ischemic stroke, particularly in light of suspected ICA stenosis  - Allow SBP up to 220 and DBP up to 120 in order to salvage penumbra, prevent recurrence  - Consider resumption of home meds after 24 hrs    5. BPH - Pt describes difficulty initiating stream, occasional retention  - Bladder scan if unable to void, or for abd distension  - In and out cath for retention  - Consider starting Flomax; given 1st dose hypotensive effect, holding off for now while keeping BP high in setting of acute stroke    6. CAD - Continue high-intensity statin, ASA  - Troponin 0.02 in ED, no anginal complaints  - EKG with inferolateral TwI, will monitor on tele for ischemic changes  - Resume home antihypertensives when appropriate   DVT ppx:  SQ Lovenox      Code Status: Full code Family Communication: None at bed side.               Disposition Plan: Admit to inpatient   Date of Service 10/07/2015    [name], MD [facility] Pager [phone]  If 7PM-7AM, please contact night-coverage [email or web address] Password [email or web address] 10/07/2015, 3:00 AM

## 2015-10-07 NOTE — Care Management Note (Signed)
Case Management Note  Patient Details  Name: [name] MRN: [id number] Date of Birth: 1941-01-11  Subjective/Objective:                    Action/Plan: Patient was admitted with CVA. Will follow for discharge needs pending PT/OT evals and physician orders.  Expected Discharge Date:  10/10/15               Expected Discharge Plan:     In-House Referral:     Discharge planning Services     Post Acute Care Choice:    Choice offered to:     DME Arranged:    DME Agency:     HH Arranged:    HH Agency:     Status of Service:  In process, will continue to follow  Medicare Important Message Given:    Date Medicare IM Given:    Medicare IM give by:    Date Additional Medicare IM Given:    Additional Medicare Important Message give by:     If discussed at [place] of Stay Meetings, dates discussed:    Additional Comments:  Rolm Baptise, RN 10/07/2015, 11:32 AM [phone]

## 2015-10-07 NOTE — Progress Notes (Signed)
Progress Note   [name] [id number] DOB: May 07, 1941 DOA: 10/06/2015 PCP: Cathlean Cower, MD   Brief Narrative:   [name] is an 75 y.o. male with a PMH of hypertension, prior stroke, prior MI, and atrial fibrillation not on chronic anticoagulation secondary to history of anemia who was admitted 10/07/15 with chief complaint of a one-day history of left leg weakness and numbness. Upon initial evaluation in the ED, he was hypertensive with blood pressure in the 220/100 range. CT of the head was negative for acute intracranial findings. MRI subsequently showed acute ischemic infarcts at the cortical gray and deep white matter of the right frontal, parietal and occipital lobes. Neurological consultation was subsequently requested.  Assessment/Plan:   Principal Problems:   Acute Ischemic stroke/CVA (cerebrovascular accident) [facility])  Admitted to telemetry.  Stroke service consulted.  Not a candidate for TPA: Symptom onset >24 hours Prior to presentation.  CT head negative.  MRI brain showed infarcts in the cortical gray and deep white matter of the right frontal, parietal and occipital lobes.  Aspirin daily.  Follow up FLP, hemoglobin A1c.  Follow-up 2 D Echocardiogram and carotid dopplers.  Passed swallowing screen, on heart healthy diet.  Neuro checks Q 2 hours x 12 hours, then Q 4 hours.  PT/OT evaluations requested.    Hypertension/hypertensive urgency  Antihypertensive treatment is recommended for both prevention of recurrent stroke and prevention of other vascular events in persons who have had an ischemic stroke or TIA and are beyond the hyperacute period (Class I, Level of Evidence A). Because this benefit extends to persons with and without a history of hypertension, this recommendation should be considered for all ischemic stroke and TIA patients (Class IIa, Level of Evidence B). An absolute target BP level and reduction are uncertain and should be  individualized, but benefit has been associated with an average reduction of ?10/5 mm Hg, and normal BP levels have been defined as <120/80 mm Hg by JNC-7 (Class IIa, Level of Evidence B).    Continue labetalol as needed to reduce markedly elevated blood pressure of greater than [id number] systolic or greater than 123456 diastolic.  Continue furosemide.  Allow permissive hypertension and acute stroke setting, but gradually reduce blood pressure over the next 5-7 days.   Paroxysmal Atrial fibrillation  For patients with ischemic stroke or TIA with persistent or paroxysmal (intermittent) AF, anticoagulation with adjusted-dose warfarin (target INR, 2.5; range, 2.0 to 3.0) is recommended (Class I, Level of Evidence A)   For patients unable to take oral anticoagulants, aspirin 325 mg/d is recommended (Class I, Level of Evidence A).   Has not been a candidate for anticoagulation the past secondary to anemia, but may need to reconsider risk/benefit ratio.  Active Problems:   BPH (benign prostatic hyperplasia)  Monitor for urinary retention.    CAD (coronary artery disease)  Continue aspirin and statin therapy. Troponin 0.02 in the ED. No complaints of angina.    CKD (chronic kidney disease), stage III - Creatinine stable.    Dyslipidemia - Continue statin.    DVT Prophylaxis - Lovenox ordered.   Family Communication/Anticipated D/C date and plan/Code Status   Family Communication: Close friend at the bedside. Disposition Plan/date: Home versus SNF when stable. Code Status: Full code.   IV Access:    Peripheral IV   Procedures and diagnostic studies:   Ct Head Wo Contrast  10/06/2015  CLINICAL DATA:  Left leg weakness. History of left MCA territory infarct. EXAM: CT  HEAD WITHOUT CONTRAST TECHNIQUE: Contiguous axial images were obtained from the base of the skull through the vertex without intravenous contrast. COMPARISON:  Brain MR 11/20/2011.  CT 11/19/2011. FINDINGS: Sinuses/Soft  tissues: Mucosal thickening of left maxillary sinus ain ethmoid air cells. Clear mastoid air cells. Intracranial: Mild low density in the periventricular white matter likely related to small vessel disease. Cerebral and cerebellar atrophy. Vertebral and carotid atherosclerosis bilaterally. No mass lesion, hemorrhage, hydrocephalus, acute infarct, intra-axial, or extra-axial fluid collection. IMPRESSION: 1.  No acute intracranial abnormality. 2.  Cerebral atrophy and small vessel ischemic change. 3. Sinus disease. Electronically Signed   By: Abigail Miyamoto M.D.   On: 10/06/2015 17:15   Mr Brain Wo Contrast  10/06/2015  CLINICAL DATA:  Initial evaluation for acute left leg weakness. EXAM: MRI HEAD WITHOUT CONTRAST TECHNIQUE: Multiplanar, multiecho pulse sequences of the brain and surrounding structures were obtained without intravenous contrast. COMPARISON:  Prior CT from earlier the same day. FINDINGS: There are multi focal patchy areas of restricted diffusion involving the cortical gray matter and subcortical white matter of the right frontal and parietal lobes. Few scattered small foci of patchy ischemic infarcts present within the right occipital lobe as well. These primarily involve the cortical gray matter and deep white matter of the centrum semi ovale. No basal ganglia infarct. No associated hemorrhage or significant mass effect. No other infarct involving the left cerebral hemisphere or infratentorial brain. Circumferential FLAIR signal within the visualized distal cervical/petrous right ICA suggestive of possible slow flow and/or partial occlusion or atheromatous disease. Major intracranial vascular flow voids otherwise maintained. Diffuse prominence of the CSF containing spaces compatible with generalized cerebral atrophy. Patchy T2/FLAIR hyperintensity within the periventricular and deep white matter both cerebral hemispheres most consistent with chronic small vessel ischemic disease, moderate in nature.  Small vessel type changes present within the pons. Few scattered small remote lacunar infarcts within the bilateral corona radiata. Small remote lacunar infarct within the left thalamus. No mass lesion, midline shift, or mass effect. No hydrocephalus. No extra-axial fluid collection. Major dural sinuses are grossly patent. Craniocervical junction within normal limits. Mild degenerative spondylolysis present within the upper cervical spine without significant stenosis. Pituitary gland normal.  No acute abnormality about the orbits. Scattered mucosal thickening within the paranasal sinuses. Small air-fluid level within the left maxillary sinuses. No mastoid effusion. Inner ear structures within normal limits. Bone marrow signal intensity normal. No scalp soft tissue abnormality. IMPRESSION: 1. Patchy acute ischemic nonhemorrhagic infarcts involving the cortical gray matter and deep white matter of the right frontal, right parietal, and right occipital lobes. No associated mass effect. 2. Subtle abnormal flow void within the distal right ICA, which may reflect underlying atheromatous disease, slow flow, and/or partial occlusion. 3. Generalized cerebral atrophy with moderate chronic small vessel ischemic disease. 4. Mild paranasal sinus disease as above. Electronically Signed   By: Jeannine Boga M.D.   On: 10/06/2015 22:30     Medical Consultants:    Neurology  Anti-Infectives:   Anti-infectives    None      Subjective:   TAJINDER DINGLEY still feels a bit weak on his left side, mainly in the lower extremities. He lives alone. No current complaints of pain, nausea, or dyspnea.  Objective:    Filed Vitals:   10/07/15 0430 10/07/15 0600 10/07/15 0630 10/07/15 0806  BP: 194/86 191/77 173/71 191/78  Pulse: 70 63 64   Temp: 98.2 F (36.8 C) 98.4 F (36.9 C) 98.3 F (36.8 C) 98.3  F (36.8 C)  TempSrc:  Oral Oral Oral  Resp: 18 18 18 18   Height:      Weight:      SpO2: 98% 100% 100%  0%   No intake or output data in the 24 hours ending 10/07/15 0818 Filed Weights   10/07/15 0028  Weight: 76.386 kg (168 lb 6.4 oz)    Exam: Gen:  NAD Cardiovascular:  RRR, No M/R/G Respiratory:  Lungs CTAB Gastrointestinal:  Abdomen soft, NT/ND, + BS Extremities:  No C/E/C Neuro: Subtle left upper extremity weakness, more pronounced left lower extremity weakness   Data Reviewed:    Labs: Basic Metabolic Panel:  Recent Labs Lab 10/06/15 1542 10/06/15 1547  NA 138 142  K 3.5 3.6  CL 102 102  CO2 26  --   GLUCOSE 76 78  BUN 14 18  CREATININE 1.10 1.00  CALCIUM 9.1  --    GFR Estimated Creatinine Clearance: 59.4 mL/min (by C-G formula based on Cr of 1). Liver Function Tests:  Recent Labs Lab 10/06/15 1542  AST 22  ALT 12*  ALKPHOS 57  BILITOT 0.5  PROT 7.6  ALBUMIN 3.5   Coagulation profile  Recent Labs Lab 10/06/15 1542  INR 1.13    CBC:  Recent Labs Lab 10/06/15 1542 10/06/15 1547  WBC 4.5  --   NEUTROABS 2.9  --   HGB 12.6* 15.6  HCT 39.8 46.0  MCV 68.4*  --   PLT 181  --    CBG:  Recent Labs Lab 10/06/15 1646  GLUCAP 61*   Microbiology No results found for this or any previous visit (from the past 240 hour(s)).   Medications:   . aspirin  300 mg Rectal Daily   Or  . aspirin  325 mg Oral Daily  . atorvastatin  80 mg Oral Daily  . enoxaparin (LOVENOX) injection  40 mg Subcutaneous Daily  . furosemide  20 mg Oral Daily  . sodium chloride flush  3 mL Intravenous Q12H   Continuous Infusions:   Time spent: 35 minutes with > 50% of time discussing current diagnostic test results, clinical impression and plan of care.   LOS: 1 day   Hallee Mckenny  [facility] Pager [phone]. If unable to reach me by pager, please call my cell phone at [phone].  *Please refer to [email or web address], password [email or web address] to get updated schedule on who will round on this patient, as hospitalists switch teams weekly. If 7PM-7AM, please contact  night-coverage at [email or web address], password [email or web address] for any overnight needs.  10/07/2015, 8:18 AM

## 2015-10-07 NOTE — ED Notes (Signed)
Floor RN concerned with high BP; Opyd, MD consulted and reports permissive HTN for SBP < or = to 220; PRN BP medication ordered by Myna Hidalgo, MD

## 2015-10-07 NOTE — Consult Note (Signed)
Neurology Consultation Reason for Consult: Leg weakness Referring Physician: Stark Jock, D  CC: Leg weakness  History is obtained from: Patient  HPI: [name] is a 75 y.o. male a history of hypertension, stroke, myocardial infarction, atrial fibrillation. He had atrial fibrillation during a hospitalization. It was noted in a note from 2013 and it was stated that he was not started on anticoagulation due to anemia. He had a colonoscopy with internal hemorrhoids.  He states that he has been having difficulty with his leg since Friday.  LKW: Friday, unclear time tpa given?: no, out of window Premorbid modified rankin scale: 1   ROS: A 14 point ROS was performed and is negative except as noted in the HPI.   Past Medical History  Diagnosis Date  . Hypertension     echocardiogram 6/10: Moderate LVH, EF 55-65%, mild, mild MR, MAC, mild LAE, PASP 32  . Dyslipidemia   . History of stroke   . CAD (coronary artery disease)     cath in 1/08: EF 60%, mild dilated Ao root, oD2 30%, LAD 20-30%.  . Paroxysmal atrial fibrillation ([place])   . Anemia, iron deficiency 08/15/2011  . History of pneumonia 08/15/2011  . History of MI (myocardial infarction) 08/15/2011  . 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 Stroke ([place]) 10/2011  . Myocardial infarction ([place])   . Hyperlipidemia   . Shortness of breath dyspnea      Family History  Problem Relation Age of Onset  . Colon cancer Neg Hx   . Aneurysm Sister      Social History:  reports that he has never smoked. He has never used smokeless tobacco. He reports that he does not drink alcohol or use illicit drugs.   Exam: Current vital signs: BP 213/138 mmHg  Pulse 85  Temp(Src) 98.4 F (36.9 C) (Oral)  Resp 20  Wt 76.386 kg (168 lb 6.4 oz)  SpO2 98% Vital signs in last 24 hours: Temp:  [98.4 F (36.9 C)-98.5 F (36.9 C)] 98.4 F (36.9 C) (03/06 0028) Pulse Rate:  [62-85] 85 (03/06 0028) Resp:  [11-23] 20 (03/06 0028) BP:  (157-223)/(84-138) 213/138 mmHg (03/06 0028) SpO2:  [96 %-100 %] 98 % (03/06 0028) Weight:  [76.386 kg (168 lb 6.4 oz)] 76.386 kg (168 lb 6.4 oz) (03/06 0028)   Physical Exam  Constitutional: Appears well-developed and well-nourished.  Psych: Affect appropriate to situation Eyes: No scleral injection HENT: No OP obstrucion Head: Normocephalic.  Cardiovascular: Normal rate and regular rhythm.  Respiratory: Effort normal and breath sounds normal to anterior ascultation GI: Soft.  No distension. There is no tenderness.  Skin: WDI  Neuro: Mental Status: Patient is awake, alert, oriented to person, place, month, year, and situation. Patient is able to give a clear and coherent history. No signs of aphasia or neglect Cranial Nerves: II: Visual Fields are full. Pupils are equal, round, and reactive to light.   III,IV, VI: EOMI without ptosis or diploplia.  V: Facial sensation is symmetric to temperature VII: Facial movement is notable for decreased movement on the left side VIII: hearing is intact to voice X: Uvula elevates symmetrically XI: Shoulder shrug is symmetric. XII: tongue is midline without atrophy or fasciculations.  Motor: Tone is normal. Bulk is normal. 5/5 strength was present in all four extremities to confrontation, but he has mild drift in both her left arm and leg. Sensory: Sensation is symmetric to light touch and temperature in the arms and legs. Cerebellar: FNF are intact bilaterally  I have reviewed labs in epic and the results pertinent to this consultation are: CMP-unremarkable  I have reviewed the images obtained: MRI brain-patchy infarcts in the right ICA distribution  Impression: 75 year old male with patchy infarcts in the right ICA distribution. Possible ideologies include large vessel atheromatous occlusive disease, embolic disease with breakup of the embolus, embolus with large vessel occlusion. He does have a history of atrial fibrillation, and  has been off of anticoagulation due to anemia, this may need to be revisited. I'm not certain if he has a known recurrence of atrial fibrillation or not.  Recommendations: 1. HgbA1c, fasting lipid panel 2. CT angiogram of the head and neck  3. Frequent neuro checks 4. Echocardiogram 5. Carotid dopplers are not needed given CTA of the neck 6. Prophylactic therapy-Antiplatelet med: Aspirin - dose 325mg  PO or 300mg  PR 7. Risk factor modification 8. Telemetry monitoring 9. PT consult, OT consult, Speech consult 10. please page stroke NP  Or  PA  Or MD  M-F from 8am -4 pm starting 3/6 as this patient will be followed by the stroke team at this point.   You can look them up on [email or web address]  Password [email or web address]    Roland Rack, MD [facility] [phone]  If 7pm- 7am, please page neurology on call as listed in [place].

## 2015-10-07 NOTE — Progress Notes (Signed)
  Echocardiogram 2D Echocardiogram has been performed.  [name] 10/07/2015, 1:10 PM

## 2015-10-08 ENCOUNTER — Inpatient Hospital Stay (HOSPITAL_COMMUNITY): Payer: Medicare Other

## 2015-10-08 ENCOUNTER — Encounter (HOSPITAL_COMMUNITY): Payer: Self-pay | Admitting: Internal Medicine

## 2015-10-08 DIAGNOSIS — I6521 Occlusion and stenosis of right carotid artery: Secondary | ICD-10-CM | POA: Diagnosis present

## 2015-10-08 DIAGNOSIS — I6529 Occlusion and stenosis of unspecified carotid artery: Secondary | ICD-10-CM | POA: Diagnosis present

## 2015-10-08 DIAGNOSIS — R7303 Prediabetes: Secondary | ICD-10-CM

## 2015-10-08 HISTORY — DX: Prediabetes: R73.03

## 2015-10-08 LAB — CBC
HEMATOCRIT: 38.4 % — AB (ref 39.0–52.0)
Hemoglobin: 12.2 g/dL — ABNORMAL LOW (ref 13.0–17.0)
MCH: 21.7 pg — ABNORMAL LOW (ref 26.0–34.0)
MCHC: 31.8 g/dL (ref 30.0–36.0)
MCV: 68.4 fL — ABNORMAL LOW (ref 78.0–100.0)
PLATELETS: 178 10*3/uL (ref 150–400)
RBC: 5.61 MIL/uL (ref 4.22–5.81)
RDW: 16 % — AB (ref 11.5–15.5)
WBC: 5.6 10*3/uL (ref 4.0–10.5)

## 2015-10-08 LAB — HEMOGLOBIN A1C
HEMOGLOBIN A1C: 6.2 % — AB (ref 4.8–5.6)
MEAN PLASMA GLUCOSE: 131 mg/dL

## 2015-10-08 MED ORDER — LABETALOL HCL 5 MG/ML IV SOLN
INTRAVENOUS | Status: AC
  Filled 2015-10-08: qty 4

## 2015-10-08 MED ORDER — FENTANYL CITRATE (PF) 100 MCG/2ML IJ SOLN
INTRAMUSCULAR | Status: AC | PRN
  Administered 2015-10-08: 25 ug via INTRAVENOUS

## 2015-10-08 MED ORDER — LIDOCAINE HCL 1 % IJ SOLN
INTRAMUSCULAR | Status: AC
  Filled 2015-10-08: qty 20

## 2015-10-08 MED ORDER — IOHEXOL 300 MG/ML  SOLN
250.0000 mL | Freq: Once | INTRAMUSCULAR | Status: AC | PRN
  Administered 2015-10-08: 70 mL via INTRA_ARTERIAL

## 2015-10-08 MED ORDER — MIDAZOLAM HCL 2 MG/2ML IJ SOLN
INTRAMUSCULAR | Status: AC
  Filled 2015-10-08: qty 2

## 2015-10-08 MED ORDER — MIDAZOLAM HCL 2 MG/2ML IJ SOLN
INTRAMUSCULAR | Status: AC | PRN
  Administered 2015-10-08: 1 mg via INTRAVENOUS

## 2015-10-08 MED ORDER — HEPARIN SODIUM (PORCINE) 1000 UNIT/ML IJ SOLN
INTRAMUSCULAR | Status: AC | PRN
  Administered 2015-10-08: 1000 [IU] via INTRAVENOUS

## 2015-10-08 MED ORDER — FENTANYL CITRATE (PF) 100 MCG/2ML IJ SOLN
INTRAMUSCULAR | Status: AC
Start: 2015-10-08 — End: 2015-10-08
  Filled 2015-10-08: qty 2

## 2015-10-08 MED ORDER — "THROMBI-PAD 3""X3"" EX PADS"
1.0000 | MEDICATED_PAD | Freq: Once | CUTANEOUS | Status: AC
  Administered 2015-10-08: 1 via TOPICAL
  Filled 2015-10-08: qty 1

## 2015-10-08 MED ORDER — HYDRALAZINE HCL 20 MG/ML IJ SOLN
INTRAMUSCULAR | Status: AC
  Filled 2015-10-08: qty 1

## 2015-10-08 MED ORDER — SODIUM CHLORIDE 0.9 % IV SOLN: INTRAVENOUS | Status: AC

## 2015-10-08 MED ORDER — HEPARIN SOD (PORK) LOCK FLUSH 100 UNIT/ML IV SOLN
INTRAVENOUS | Status: AC
  Filled 2015-10-08: qty 15

## 2015-10-08 MED ORDER — HYDRALAZINE HCL 20 MG/ML IJ SOLN
INTRAMUSCULAR | Status: AC | PRN
  Administered 2015-10-08 (×3): 5 mg via INTRAVENOUS

## 2015-10-08 MED ORDER — HEPARIN SOD (PORK) LOCK FLUSH 100 UNIT/ML IV SOLN
INTRAVENOUS | Status: AC | PRN
  Administered 2015-10-08: 500 [IU] via INTRAVENOUS

## 2015-10-08 NOTE — Sedation Documentation (Signed)
Patient is resting comfortably. No complaints of pain at this time. 

## 2015-10-08 NOTE — Plan of Care (Signed)
Problem: Food- and Nutrition-Related Knowledge Deficit (NB-1.1) Goal: Nutrition education Formal process to instruct or train a patient/client in a skill or to impart knowledge to help patients/clients voluntarily manage or modify food choices and eating behavior to maintain or improve health. Outcome: Adequate for Discharge Nutrition Education Note   RD consulted for nutrition education regarding prediabetes  Provided "General Healthy Nutrition" and "Using Nutrition Labels: Carbohydrates" handouts from the Academy of Nutrition and Dietetics. Provided "MyPlate" handout from http://carter.biz/.   Obtained diet recall. Pt reports living alone and eating out for most meals. Pt frequently eats at restaurants such as [employer], [employer], [place]. He states often skips breakfast, eats chicken pot pie for lunch, and will eat out for dinner. Often consumes fried chicken, mashed potatoes, and greenbeans. Pt reports drinking sweet tea, sodas, and apricot juice.   Discussed the MyPlate method with pt. Encouraged pt to switch to diet drinks, and to try grilled chicken in place of fried chicken. Encouraged pt to consume three meals daily. Pt states that he could try having boiled egg for breakfast. Encouraged pt to read food labels and nutrition information to make better informed food choices.   Expected compliance: fair/poor   BMI of 27.2 pt meets criteria for overweight based on current BMI  Current diet order id regular pt is consuming 100% of his meals.  Labs reviewed; HgbA1c 6.2, cholesterol 262, HDL 32, LDL 206 Medications reviewed.   No further nutrition interventions warranted at this time. Please consult RD if nutritional needs arise.   Raford Pitcher, Dietetic Intern Pager: [phone]

## 2015-10-08 NOTE — Progress Notes (Signed)
STROKE TEAM PROGRESS NOTE   SUBJECTIVE (INTERVAL HISTORY) Dr. Leonie Man discussed diagnosis, prognosis,  treatment options and plan of care with patient. He has asked that Dr. Leonie Man call his pastor. He wants Dr. Leonie Man to share his current condition with his pastor.     OBJECTIVE Temp:  [97.5 F (36.4 C)-98.2 F (36.8 C)] 97.7 F (36.5 C) (03/07 1110) Pulse Rate:  [58-90] 76 (03/07 1110) Cardiac Rhythm:  [-] Normal sinus rhythm (03/07 1041) Resp:  [11-22] 16 (03/07 1110) BP: (154-201)/(81-113) 186/84 mmHg (03/07 1110) SpO2:  [99 %-100 %] 100 % (03/07 1110)  CBC:   Recent Labs Lab 10/06/15 1542 10/06/15 1547 10/08/15 0007  WBC 4.5  --  5.6  NEUTROABS 2.9  --   --   HGB 12.6* 15.6 12.2*  HCT 39.8 46.0 38.4*  MCV 68.4*  --  68.4*  PLT 181  --  0000000    Basic Metabolic Panel:   Recent Labs Lab 10/06/15 1542 10/06/15 1547  NA 138 142  K 3.5 3.6  CL 102 102  CO2 26  --   GLUCOSE 76 78  BUN 14 18  CREATININE 1.10 1.00  CALCIUM 9.1  --     Lipid Panel:     Component Value Date/Time   CHOL 262* 10/07/2015 1114   TRIG 118 10/07/2015 1114   HDL 32* 10/07/2015 1114   CHOLHDL 8.2 10/07/2015 1114   VLDL 24 10/07/2015 1114   LDLCALC 206* 10/07/2015 1114   HgbA1c:  Lab Results  Component Value Date   HGBA1C 6.2* 10/07/2015   Urine Drug Screen:     Component Value Date/Time   LABOPIA NEGATIVE 01/01/2009 0600   COCAINSCRNUR NEGATIVE 01/01/2009 0600   LABBENZ NEGATIVE 01/01/2009 0600   AMPHETMU NEGATIVE 01/01/2009 0600      IMAGING  Ct Head Wo Contrast 10/06/2015  1.  No acute intracranial abnormality. 2.  Cerebral atrophy and small vessel ischemic change. 3. Sinus disease.    Ct Angio Head & Neck W/cm &/or Wo/cm 10/07/2015  1. Radiographic String Sign Stenosis at the right ICA origin related to Low-density plaque or thrombus, however, there is superimposed severe intracranial atherosclerosis including high-grade distal right ICA stenosis in part due to bulky  calcified plaque. 2. Widespread intracranial moderate to severe first and second order vessel irregularity and stenosis, including severe stenoses at the: -bilateral distal vertebral arteries, - proximal basilar artery, - bilateral PCAs, - right ACA A1, - bilateral ACA distal A2 segments. 3. No intracranial hemorrhage or mass effect.   Mr Brain Wo Contrast 10/06/2015  1. Patchy acute ischemic nonhemorrhagic infarcts involving the cortical gray matter and deep white matter of the right frontal, right parietal, and right occipital lobes. No associated mass effect. 2. Subtle abnormal flow void within the distal right ICA, which may reflect underlying atheromatous disease, slow flow, and/or partial occlusion. 3. Generalized cerebral atrophy with moderate chronic small vessel ischemic disease. 4. Mild paranasal sinus disease as above.   2D Echocardiogram  - Left ventricle: The cavity size was normal. Wall thickness wasincreased in a pattern of severe LVH. Systolic function wasnormal. The estimated ejection fraction was in the range of 55%to 60%. Left ventricular diastolic function parameters werenormal. - Aortic valve: There was mild regurgitation. - Mitral valve: There was mild regurgitation. - Left atrium: The atrium was moderately dilated.  Cerebral Angiogram 10/08/2015 1.Preocclusive stenosis of RT ICA prox . 2.Approx 80 % stenosis of RT ICA supraclinoid seg and 50 % of cavernous seg. 3.Occluded RT  ACA A1 seg. 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 . Afebrile. Head is nontraumatic. Neck is supple without bruit.    Cardiac exam no murmur or gallop. Lungs are clear to auscultation. Distal pulses are well felt. Neurological Exam :  Awake alert oriented x 3 normal speech and language. Mild left lower face asymmetry. Tongue midline. No drift. Mild diminished fine  finger movements on left. Orbits right over left upper extremity. Mild left grip weak.. Normal sensation . Normal coordination.   ASSESSMENT/PLAN [name] is a 75 y.o. male with history of hypertension, stroke, myocardial infarction, atrial fibrillation presenting with left leg weakness. He did not receive IV t-PA due to delay in arrival.   Stroke:  patchy right frontal, right parietal and right occipital infarcts, likely secondary to large vessel disease source  MRI  patchy right frontal, right parietal and right occipital infarcts  CTA angio head and neck right ICA origin string sign with superimposed severe intracranial atherosclerosis including high-grade right ICA stenosis due to bulky calcified plaque.  2D Echo  No source of embolus   Cerebral angiogram R supraclinoid ICA 90%, cavernous 50%. Occluded R A1. 70% L VBJ/prox BA  LDL 206  HgbA1c 6.2  Lovenox 40 mg sq daily for VTE prophylaxis Diet regular Room service appropriate?: Yes; Fluid consistency:: Thin  aspirin 81 mg daily prior to admission, now on aspirin 325 mg daily  Patient counseled to be compliant with his antithrombotic medications  Ongoing aggressive stroke risk factor management  Therapy recommendations:  pending   Disposition:  pending   Intra and Extracranial stenoses  Cerebral angiogram R supraclinoid ICA 90%, cavernous 50%. Occluded R A1. 70% L VBJ/prox BA  Dr. Leonie Man has contacted [facility] to discuss case. ? Consideration of surgery/stent (Dr. Trula Slade)  Atrial Fibrillation  Home anticoagulation:  aspirin 81 mg daily   In note from 2013 stated not on anticoagulation d/t anemia, colonscopy with internal hemorrhoids  11/2011 - subacute stroke found on evaluation of dizziness - pt with afib. Coumadin recommended Seen by neurology ([facility])   Hypertension  Elevated, but acceptable Permissive hypertension (OK if < 220/120) but gradually normalize in 5-7 days  Hyperlipidemia  Home meds:   Lipitor 80, resumed in hospital  LDL 206, goal < 70  Continue statin at discharge  Other Stroke Risk Factors  Advanced age  Hx stroke/TIA  11/2011 - subacute stroke found on evaluation of dizziness. Coumadin recommended Seen by neurology ([facility])  Coronary artery disease  Other Active Problems  Chronic kidney disease stage III  BPH  Hospital day # 2  BIBY,SHARON  [place] for Pager information 10/08/2015 12:41 PM  I have personally examined this patient, reviewed notes, independently viewed imaging studies, participated in medical decision making and plan of care. I have made any additions or clarifications directly to the above note. Agree with note above. The patient catheter angiogram shows a radiographic string sign in the proximal right ICA as well as 80% right cavernous carotid stenosis as well with additional multivessel stenosis intracranially. This represents a high risk situation with patient being at significant risk for carotid occlusion, strokes, neurological worsening. He may possibly benefit with carotid revascularization. We will consult vascular surgery to decide whether carotid endarterectomy or stenting may be a better choice. This will have to be done soon preferably  prior to discharge him home. Upon the patient's request I called his pastor Dr. Kennon Rounds and updated him about patient's medical condition and answered questions.  Antony Contras, MD Medical Director [facility] Pager: [phone] 10/08/2015 2:54 PM   To contact Stroke Continuity provider, please refer to [email or web address] After hours, contact General Neurology

## 2015-10-08 NOTE — Progress Notes (Signed)
Patient back from procedure at this time. He denied any distress. TELE called and confirmed. VS stable at this time. Patient verbalized that he is on bedrest until 1515. Will increase rounding at this time and reinforce as needed. Lunch ordered. Will continue to monitor.   Ave Filter, RN

## 2015-10-08 NOTE — Progress Notes (Signed)
Received Patient in bed awake a,alert and oriented x 3  Noted rt groin site bleeding  Pressure dressing applied . RN will continue to monitor.

## 2015-10-08 NOTE — Evaluation (Signed)
Speech Language Pathology Evaluation Patient Details Name: [name] MRN: [id number] DOB: [birth year] Today's Date: 10/08/2015 Time: 1450-1530 SLP Time Calculation (min) (ACUTE ONLY): 40 min  Problem List:  Patient Active Problem List   Diagnosis Date Noted  . Prediabetes 10/08/2015  . Right cavernous carotid stenosis   . Carotid stenosis   . Acute ischemic stroke ([place]) 10/07/2015  . Acute CVA (cerebrovascular accident) ([place]) 10/06/2015  . Chest pain 06/19/2015  . DOE (dyspnea on exertion) 06/19/2015  . Hypertensive urgency 06/19/2015  . Wheezing   . Bilateral arm pain 06/26/2014  . 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 Impingement syndrome of left shoulder 01/20/2013  . Atrial fibrillation ([place]) 04/18/2012  . Leg pain 01/28/2012  . Dizziness and giddiness 11/19/2011  . Dehydration 11/19/2011  . CKD (chronic kidney disease), stage III 11/19/2011  . HTN (hypertension) 11/19/2011  . Dyslipidemia 11/19/2011  . PAF (paroxysmal atrial fibrillation) ([place]) 11/19/2011  . Erectile dysfunction 08/20/2011  . Nocturia 08/20/2011  . Preventative health care 08/15/2011  . Anemia, iron deficiency 08/15/2011  . History of pneumonia 08/15/2011  . CAD (coronary artery disease) 08/15/2011  . History of MI (myocardial infarction) 08/15/2011  . Dilated aortic root ([place]) 08/15/2011  . Syncope 07/16/2011  . Claudication ([place]) 07/16/2011  . BPH (benign prostatic hyperplasia) 07/16/2011  . Rash 04/29/2011  . CVA (cerebral infarction) 03/31/2011  . Inguinal hernia 03/31/2011   Past Medical History:  Past Medical History  Diagnosis Date  . Hypertension     echocardiogram 6/10: Moderate LVH, EF 55-65%, mild, mild MR, MAC, mild LAE, PASP 32  . Dyslipidemia   . History of stroke   . CAD (coronary artery disease)     cath in 1/08: EF 60%, mild dilated Ao root, oD2 30%, LAD 20-30%.  . Paroxysmal atrial fibrillation ([place])   . Anemia, iron deficiency 08/15/2011  . History of pneumonia  08/15/2011  . History of MI (myocardial infarction) 08/15/2011  . Dilated aortic root ([place]) 08/15/2011  . Stroke ([place]) 10/2011  . Myocardial infarction ([place])   . Hyperlipidemia   . Shortness of breath dyspnea   . Prediabetes 10/08/2015   Past Surgical History:  Past Surgical History  Procedure Laterality Date  . Cardiac catheterization     HPI:  [name] is a 75 y.o. male  who presents to the ED with 1 day of weakness and numbness involving the left leg. Patient notes that it for approximately 24 hours prior to his arrival, the left leg has been numb and weak, making it difficult to put his pants on this morning and ambulate.  MRI demonstrates acute ischemic infarcts of the cortical gray and deep white matter of the right frontal, right parietal, and right occipital lobes.    Assessment / Plan / Recommendation Clinical Impression   Pt presents with mild cognitive deficits s/p CVA with underlying baseline cognitive impairment, further exacerbated by hearing impairment.  Pt scored a 15 out of 30 on the MoCA (n >/= 26) with deficits most notable for delayed recall and working memory.  Pt also presents with a subtle dysarthria characterized by decreased vocal intensity and slightly impaired rate and coordination for articulation impacting precision and intelligibility in conversations.  Pt would benefit from skilled ST while inpatient for dysarthria and cognitive impairment.  Discharge recommendations to be determined pending progress made while inpatient.      SLP Assessment  Patient needs continued Speech Lanaguage Pathology Services    Follow Up Recommendations  Other (comment) (  TBD)    Frequency and Duration min 1 x/week  1 week      SLP Evaluation Prior Functioning  Cognitive/Linguistic Baseline: Baseline deficits Available Help at Discharge: Friend(s) Education: 8th grade    Cognition  Overall Cognitive Status: Impaired/Different from baseline Arousal/Alertness:  Awake/alert Orientation Level: Oriented X4 Attention: Sustained Sustained Attention: Appears intact Memory: Impaired Memory Impairment: Decreased recall of new information Awareness: Impaired Awareness Impairment: Emergent impairment Problem Solving: Impaired Problem Solving Impairment: Functional basic Safety/Judgment: Appears intact    Comprehension  Auditory Comprehension Overall Auditory Comprehension: Appears within functional limits for tasks assessed (pt is hard of hearing, need to be facing him when talking )    Expression Expression Primary Mode of Expression: Verbal Verbal Expression Overall Verbal Expression: Appears within functional limits for tasks assessed   Oral / Motor  Oral Motor/Sensory Function Overall Oral Motor/Sensory Function: Mild impairment (mildly impaired coordination, decreased diadochokinetic rate) Facial ROM: Within Functional Limits Facial Symmetry: Within Functional Limits Facial Strength: Within Functional Limits Facial Sensation: Within Functional Limits Lingual ROM: Within Functional Limits Lingual Symmetry: Within Functional Limits Lingual Strength: Within Functional Limits Motor Speech Overall Motor Speech: Impaired Respiration: Within functional limits Phonation: Low vocal intensity Articulation: Impaired Level of Impairment: Conversation Intelligibility: Intelligibility reduced Conversation: 75-100% accurate Motor Planning: Witnin functional limits   GO                    Tyheim Vanalstyne, Selinda Orion 10/08/2015, 3:41 PM

## 2015-10-08 NOTE — Sedation Documentation (Signed)
Patient is resting comfortably. No complaints of pain at this time, MD aware of increased BP, vitals otherwise stable.

## 2015-10-08 NOTE — Procedures (Signed)
S/P bilateral common carotid and bilateral vertbral arteriograms. R5t CFa approach. Findings. 1.Preocclusive stenosis of RT ICA prox . 2.Approx 80 % stenosis of RT ICA supraclinoid seg and 50 % of cavernous seg. 3.Occluded RT ACA A1 seg. 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 5.difuse mod to mod severe ICAD

## 2015-10-08 NOTE — Progress Notes (Signed)
Orthopedic Tech Progress Note Patient Details:  [name] 1941-03-21 [id number]  Ortho Devices Type of Ortho Device: Other (comment) Ortho Device/Splint Location: delivered 10 lb sandbag Ortho Device/Splint Interventions: Renard Matter 10/08/2015, 11:00 PM

## 2015-10-08 NOTE — Progress Notes (Signed)
PT Cancellation Note  Patient Details Name: [name] MRN: [id number] DOB: 07-Jun-1941   Cancelled Treatment:    Reason Eval/Treat Not Completed: Patient at procedure or test/unavailable. Will attempt to see later today.   Kani Chauvin 10/08/2015, 9:38 AM Pager [phone]

## 2015-10-08 NOTE — Consult Note (Signed)
[name] NOTE   MRN : [id number]  Reason for Consult: carotid stenosis   History of Present Illness: 75 y/o male admitted secondary to left leg weakness with a one day history.  No UE weakness or changes in vision.  His past medical history includes: hypertension managed with Coreg, hyperlipidemia managed with lipitor, coronary artery disease, BPH, and paroxysmal atrial fibrillation.  He is not on any anticoagulates.       Current Facility-Administered Medications  Medication Dose Route Frequency Provider Last Rate Last Dose  . 0.9 %  sodium chloride infusion  250 mL Intravenous PRN Craig S Opyd, MD      . 0.9 %  sodium chloride infusion   Intravenous Continuous Craig Deveshwar, MD      . albuterol (PROVENTIL) (2.5 MG/3ML) 0.083% nebulizer solution 2.5 mg  2.5 mg Inhalation Q6H PRN Craig Qua Opyd, MD      . aspirin suppository 300 mg  300 mg Rectal Daily Craig Bulls, MD       Or  . aspirin tablet 325 mg  325 mg Oral Daily Craig Bulls, MD   325 mg at 10/08/15 1122  . atorvastatin (LIPITOR) tablet 80 mg  80 mg Oral Daily Craig Bulls, MD   80 mg at 10/08/15 1122  . [START ON 10/09/2015] enoxaparin (LOVENOX) injection 40 mg  40 mg Subcutaneous Daily Craig Danker, PA-C      . fentaNYL (SUBLIMAZE) 100 MCG/2ML injection           . furosemide (LASIX) tablet 20 mg  20 mg Oral Daily Craig Qua Opyd, MD   20 mg at 10/08/15 1122  . heparin lock flush 100 UNIT/ML injection           . hydrALAZINE (APRESOLINE) 20 MG/ML injection           . labetalol (NORMODYNE,TRANDATE) 5 MG/ML injection           . labetalol (NORMODYNE,TRANDATE) injection 5 mg  5 mg Intravenous Q10 min PRN Craig Bulls, MD   5 mg at 10/07/15 0114  . lidocaine (XYLOCAINE) 1 % (with pres) injection           . midazolam (VERSED) 2 MG/2ML injection           . senna-docusate (Senokot-S) tablet 1 tablet  1 tablet Oral QHS PRN Craig Qua Opyd, MD      . sodium chloride flush (NS)  0.9 % injection 3 mL  3 mL Intravenous Q12H Craig Bulls, MD   3 mL at 10/07/15 2124  . sodium chloride flush (NS) 0.9 % injection 3 mL  3 mL Intravenous PRN Craig Bulls, MD        Pt meds include: Statin :Yes Betablocker: Yes ASA: Yes Other anticoagulants/antiplatelets: none  Past Medical History  Diagnosis Date  . Hypertension     echocardiogram 6/10: Moderate LVH, EF 55-65%, mild, mild MR, MAC, mild LAE, PASP 32  . Dyslipidemia   . History of stroke   . CAD (coronary artery disease)     cath in 1/08: EF 60%, mild dilated Ao root, oD2 30%, LAD 20-30%.  . Paroxysmal atrial fibrillation ([place])   . Anemia, iron deficiency 08/15/2011  . History of pneumonia 08/15/2011  . History of MI (myocardial infarction) 08/15/2011  . Dilated aortic root ([place]) 08/15/2011  . Stroke ([place]) 10/2011  . Myocardial infarction ([place])   . Hyperlipidemia   . Shortness of breath dyspnea   .  Prediabetes 10/08/2015    Past Surgical History  Procedure Laterality Date  . Cardiac catheterization      Social History Social History  Substance Use Topics  . Smoking status: Never Smoker   . Smokeless tobacco: Never Used  . Alcohol Use: No    Family History Family History  Problem Relation Age of Onset  . Colon cancer Neg Hx   . Aneurysm Sister     No Known Allergies   REVIEW OF SYSTEMS  General: [ ]  Weight loss, [ ]  Fever, [ ]  chills Neurologic: [ ]  Dizziness, [ ]  Blackouts, [ ]  Seizure [ ]  Stroke, [ ]  "Mini stroke", [ ]  Slurred speech, [ ]  Temporary blindness; [x ] weakness in arms or legs, [ ]  Hoarseness [ ]  Dysphagia Cardiac: [ ]  Chest pain/pressure, [ ]  Shortness of breath at rest [ ]  Shortness of breath with exertion, [ ]  Atrial fibrillation or irregular heartbeat  Vascular: [ ]  Pain in legs with walking, [ ]  Pain in legs at rest, [ ]  Pain in legs at night,  [ ]  Non-healing ulcer, [ ]  Blood clot in vein/DVT,   Pulmonary: [ ]  Home oxygen, [ ]  Productive cough, [ ]  Coughing up blood, [ ]   Asthma,  [ ]  Wheezing [ ]  COPD Musculoskeletal:  [ ]  Arthritis, [ ]  Low back pain, [ ]  Joint pain Hematologic: [ ]  Easy Bruising, [ ]  Anemia; [ ]  Hepatitis Gastrointestinal: [ ]  Blood in stool, [ ]  Gastroesophageal Reflux/heartburn, Urinary: [ ]  chronic Kidney disease, [ ]  on HD - [ ]  MWF or [ ]  TTHS, [ ]  Burning with urination, [ ]  Difficulty urinating Skin: [ ]  Rashes, [ ]  Wounds Psychological: [ ]  Anxiety, [ ]  Depression  Physical Examination Filed Vitals:   10/08/15 1030 10/08/15 1036 10/08/15 1040 10/08/15 1110  BP: 169/89 171/91 154/94 186/84  Pulse: 73 80 81 76  Temp:    97.7 F (36.5 C)  TempSrc:    Oral  Resp: 17 11 15 16   Height:      Weight:      SpO2: 100% 100% 99% 100%   Body mass index is 27.19 kg/(m^2).  General:  WDWN in NAD HENT: WNL Eyes: Pupils equal Pulmonary: normal non-labored breathing , without Rales, rhonchi,  wheezing Cardiac: RRR, without  Murmurs, rubs or gallops; No carotid bruits Abdomen: soft, NT, no masses Skin: no rashes, ulcers noted;  no Gangrene , no cellulitis; no open wounds;   Vascular Exam/Pulses:Palapable radial right 3+, left 2+, femorals, DP bilaterally   Musculoskeletal: no muscle wasting or atrophy; no edema  Neurologic: A&O X 3; Appropriate Affect ;  SENSATION: normal; MOTOR FUNCTION: 5/5 Symmetric no obvious weakness on manual muscle testing  Speech is fluent/normal   Significant Diagnostic Studies: CBC Lab Results  Component Value Date   WBC 5.6 10/08/2015   HGB 12.2* 10/08/2015   HCT 38.4* 10/08/2015   MCV 68.4* 10/08/2015   PLT 178 10/08/2015    BMET    Component Value Date/Time   NA 142 10/06/2015 1547   K 3.6 10/06/2015 1547   CL 102 10/06/2015 1547   CO2 26 10/06/2015 1542   GLUCOSE 78 10/06/2015 1547   BUN 18 10/06/2015 1547   CREATININE 1.00 10/06/2015 1547   CALCIUM 9.1 10/06/2015 1542   GFRNONAA >60 10/06/2015 1542   GFRAA >60 10/06/2015 1542   Estimated Creatinine Clearance: 59.4 mL/min (by  C-G formula based on Cr of 1).  COAG Lab Results  Component Value Date  INR 1.13 10/06/2015   INR 1.16 06/19/2015   INR 1.04 11/19/2011     Non-Invasive Vascular Imaging:  Ct Head Wo Contrast 10/06/2015 1. No acute intracranial abnormality. 2. Cerebral atrophy and small vessel ischemic change. 3. Sinus disease.   Ct Angio Head & Neck W/cm &/or Wo/cm 10/07/2015 1. Radiographic String Sign Stenosis at the right ICA origin related to Low-density plaque or thrombus, however, there is superimposed severe intracranial atherosclerosis including high-grade distal right ICA stenosis in part due to bulky calcified plaque. 2. Widespread intracranial moderate to severe first and second order vessel irregularity and stenosis, including severe stenoses at the: -bilateral distal vertebral arteries, - proximal basilar artery, - bilateral PCAs, - right ACA A1, - bilateral ACA distal A2 segments. 3. No intracranial hemorrhage or mass effect.   Mr Brain Wo Contrast 10/06/2015 1. Patchy acute ischemic nonhemorrhagic infarcts involving the cortical gray matter and deep white matter of the right frontal, right parietal, and right occipital lobes. No associated mass effect. 2. Subtle abnormal flow void within the distal right ICA, which may reflect underlying atheromatous disease, slow flow, and/or partial occlusion. 3. Generalized cerebral atrophy with moderate chronic small vessel ischemic disease. 4. Mild paranasal sinus disease as above.   2D Echocardiogram  - Left ventricle: The cavity size was normal. Wall thickness wasincreased in a pattern of severe LVH. Systolic function wasnormal. The estimated ejection fraction was in the range of 55%to 60%. Left ventricular diastolic function parameters werenormal. - Aortic valve: There was mild regurgitation. - Mitral valve: There was mild regurgitation. - Left atrium: The atrium was moderately dilated.   ASSESSMENT/PLAN:  CVA:  Patchy acute ischemic  nonhemorrhagic infarcts involving the cortical gray matter and deep white matter of the right frontal, right parietal, and right occipital lobes. No associated mass effect.  Right carotid stenosis:   Radiographic String Sign Stenosis at the right ICA origin related to Low-density plaque or thrombus, however, there is superimposed severe intracranial atherosclerosis including high-grade distal right ICA stenosis in part due to bulky calcified plaque.   He may be a candidate for carotid stenting.    Dr. Trula Slade will review the CTA and discuss options of interventional treatment.  [name] [facility] 10/08/2015 2:08 PM   I agree with the above.  I have seen and examined the patient.  I had a lengthy conversation with the patient and his friend regarding treatment of his right carotid stenosis which is >90% by CTA and angiography.  The lesion is very calcified.  He also has intracranial stenosis.  I feel that correction of his extra-cranial stenosis is warented to lower his risk of recurrent stroke.  Because of the calcification associated with the stenosis, CEA is a better  Option.  I discussed the risk of stroke, nerve injury, and cardio-pulmonary complications with the patient.  I will discuss this further with the stroke team tomorrow, with plans for treatment this week.  [name] [phone]

## 2015-10-08 NOTE — Sedation Documentation (Signed)
Vital signs stable. No complaints of pain at this time.

## 2015-10-08 NOTE — Sedation Documentation (Signed)
Patient is resting comfortably. Vitals stable. 

## 2015-10-08 NOTE — Progress Notes (Signed)
Progress Note   [name] [id number] DOB: 09-Apr-1941 DOA: 10/06/2015 PCP: Cathlean Cower, MD   Brief Narrative:   [name] is an 75 y.o. male with a PMH of hypertension, prior stroke, prior MI, and atrial fibrillation not on chronic anticoagulation secondary to history of anemia who was admitted 10/07/15 with chief complaint of a one-day history of left leg weakness and numbness. Upon initial evaluation in the ED, he was hypertensive with blood pressure in the 220/100 range. CT of the head was negative for acute intracranial findings. MRI subsequently showed acute ischemic infarcts at the cortical gray and deep white matter of the right frontal, parietal and occipital lobes. Neurological consultation was subsequently requested.  Assessment/Plan:   Principal Problems:   Acute Ischemic stroke/CVA (cerebrovascular accident) [facility])  Admitted to telemetry.  Stroke service consulted.  Not a candidate for TPA: Symptom onset >24 hours Prior to presentation.  CT head negative.  MRI brain showed infarcts in the cortical gray and deep white matter of the right frontal, parietal and occipital lobes.  Continue Aspirin daily.  LDL 206, hemoglobin A1c 6.2%. Needs lipid control and diet education. Was on 80 mg of Lipitor prior to admission, we'll need to verify that he was compliant with his medications. Will ask dietitian to counsel regarding prediabetes.  Echo shows EF 55-60 percent with normal diastolic parameters. Significant cerebrovascular disease noted on CT angio of the head and neck. IR consulted for cerebral arteriogram, which showed multiple areas of stenosis.  Vascular surgery subsequently consulted for consideration of stent.  Passed swallowing screen, on heart healthy diet.  Neuro checks Q 4 hours.  PT/OT evaluations still pending, patient on bed rest for 4 hours post cerebral arteriogram.    Hypertension/hypertensive urgency  Antihypertensive treatment is  recommended for both prevention of recurrent stroke and prevention of other vascular events in persons who have had an ischemic stroke or TIA and are beyond the hyperacute period (Class I, Level of Evidence A). Because this benefit extends to persons with and without a history of hypertension, this recommendation should be considered for all ischemic stroke and TIA patients (Class IIa, Level of Evidence B). An absolute target BP level and reduction are uncertain and should be individualized, but benefit has been associated with an average reduction of ?10/5 mm Hg, and normal BP levels have been defined as <120/80 mm Hg by JNC-7 (Class IIa, Level of Evidence B).    Continue labetalol as needed to reduce markedly elevated blood pressure of greater than [id number] systolic or greater than 123456 diastolic.  Continue furosemide.  Allow permissive hypertension and acute stroke setting, but gradually reduce blood pressure over the next 5-7 days.  Paroxysmal Atrial fibrillation  For patients with ischemic stroke or TIA with persistent or paroxysmal (intermittent) AF, anticoagulation with adjusted-dose warfarin (target INR, 2.5; range, 2.0 to 3.0) is recommended (Class I, Level of Evidence A)   For patients unable to take oral anticoagulants, aspirin 325 mg/d is recommended (Class I, Level of Evidence A).   Has not been a candidate for anticoagulation the past secondary to anemia, but may need to reconsider risk/benefit ratio.  Active Problems:   BPH (benign prostatic hyperplasia)  Monitor for urinary retention.    CAD (coronary artery disease)  Continue aspirin and statin therapy. Troponin 0.02 in the ED. No complaints of angina.    CKD (chronic kidney disease), stage III  Creatinine stable.    Dyslipidemia  Continue statin.    DVT  Prophylaxis  Lovenox ordered.   Family Communication/Anticipated D/C date and plan/Code Status   Family Communication: No family at the bedside today. Disposition  Plan/date: Home versus SNF when per PT recommendations, discharge date pending vascular surgery evaluation and treatment recommendations. Code Status: Full code.   IV Access:    Peripheral IV   Procedures and diagnostic studies:   Ct Head Wo Contrast  10/06/2015  CLINICAL DATA:  Left leg weakness. History of left MCA territory infarct. EXAM: CT HEAD WITHOUT CONTRAST TECHNIQUE: Contiguous axial images were obtained from the base of the skull through the vertex without intravenous contrast. COMPARISON:  Brain MR 11/20/2011.  CT 11/19/2011. FINDINGS: Sinuses/Soft tissues: Mucosal thickening of left maxillary sinus ain ethmoid air cells. Clear mastoid air cells. Intracranial: Mild low density in the periventricular white matter likely related to small vessel disease. Cerebral and cerebellar atrophy. Vertebral and carotid atherosclerosis bilaterally. No mass lesion, hemorrhage, hydrocephalus, acute infarct, intra-axial, or extra-axial fluid collection. IMPRESSION: 1.  No acute intracranial abnormality. 2.  Cerebral atrophy and small vessel ischemic change. 3. Sinus disease. Electronically Signed   By: Abigail Miyamoto M.D.   On: 10/06/2015 17:15   Mr Brain Wo Contrast  10/06/2015  CLINICAL DATA:  Initial evaluation for acute left leg weakness. EXAM: MRI HEAD WITHOUT CONTRAST TECHNIQUE: Multiplanar, multiecho pulse sequences of the brain and surrounding structures were obtained without intravenous contrast. COMPARISON:  Prior CT from earlier the same day. FINDINGS: There are multi focal patchy areas of restricted diffusion involving the cortical gray matter and subcortical white matter of the right frontal and parietal lobes. Few scattered small foci of patchy ischemic infarcts present within the right occipital lobe as well. These primarily involve the cortical gray matter and deep white matter of the centrum semi ovale. No basal ganglia infarct. No associated hemorrhage or significant mass effect. No other  infarct involving the left cerebral hemisphere or infratentorial brain. Circumferential FLAIR signal within the visualized distal cervical/petrous right ICA suggestive of possible slow flow and/or partial occlusion or atheromatous disease. Major intracranial vascular flow voids otherwise maintained. Diffuse prominence of the CSF containing spaces compatible with generalized cerebral atrophy. Patchy T2/FLAIR hyperintensity within the periventricular and deep white matter both cerebral hemispheres most consistent with chronic small vessel ischemic disease, moderate in nature. Small vessel type changes present within the pons. Few scattered small remote lacunar infarcts within the bilateral corona radiata. Small remote lacunar infarct within the left thalamus. No mass lesion, midline shift, or mass effect. No hydrocephalus. No extra-axial fluid collection. Major dural sinuses are grossly patent. Craniocervical junction within normal limits. Mild degenerative spondylolysis present within the upper cervical spine without significant stenosis. Pituitary gland normal.  No acute abnormality about the orbits. Scattered mucosal thickening within the paranasal sinuses. Small air-fluid level within the left maxillary sinuses. No mastoid effusion. Inner ear structures within normal limits. Bone marrow signal intensity normal. No scalp soft tissue abnormality. IMPRESSION: 1. Patchy acute ischemic nonhemorrhagic infarcts involving the cortical gray matter and deep white matter of the right frontal, right parietal, and right occipital lobes. No associated mass effect. 2. Subtle abnormal flow void within the distal right ICA, which may reflect underlying atheromatous disease, slow flow, and/or partial occlusion. 3. Generalized cerebral atrophy with moderate chronic small vessel ischemic disease. 4. Mild paranasal sinus disease as above. Electronically Signed   By: Jeannine Boga M.D.   On: 10/06/2015 22:30   Ct Angio Head &  Neck W/cm &/or Wo Cm  10/07/2015  CLINICAL  DATA:  75 year old male with acute right hemisphere infarcts, mostly right MCA territory, following presentation for left lower extremity weakness. Initial encounter. EXAM: CT ANGIOGRAPHY HEAD AND NECK TECHNIQUE: Multidetector CT imaging of the head and neck was performed using the standard protocol during bolus administration of intravenous contrast. Multiplanar CT image reconstructions and MIPs were obtained to evaluate the vascular anatomy. Carotid stenosis measurements (when applicable) are obtained utilizing NASCET criteria, using the distal internal carotid diameter as the denominator. CONTRAST:  87mL OMNIPAQUE IOHEXOL 350 MG/ML SOLN COMPARISON:  Brain MRI 10/06/2015 and earlier, including intracranial MRA a 11/20/2011. FINDINGS: CTA NECK Skeleton: Intermittent poor dentition. Widespread chronic cervical spine disc and endplate degeneration. No acute osseous abnormality identified. Mild paranasal sinus mucosal thickening. That in the left maxillary sinus might be associated with a small supernumerary tooth or tooth fragment (series 6, image 76). Tympanic cavities and mastoids appear clear. Other neck: Negative lung apices. No superior mediastinal lymphadenopathy. Thyroid, larynx, pharynx, parapharyngeal spaces, retropharyngeal space, sublingual space, submandibular glands, and parotid glands are within normal limits. Visualized orbits and scalp soft tissues are within normal limits. No cervical lymphadenopathy. Aortic arch: Bovine arch configuration. Mild to moderate arch atherosclerosis, with both soft and calcified plaque. Great vessel dolichoectasia and tortuosity. Right carotid system: No brachiocephalic artery or right CCA origin stenosis. Tortuous proximal right CCA. Soft and calcified plaque at the right carotid bifurcation with high-grade string sign stenosis, with low-density plaque affecting the proximal 10 mm of the vessel. See sagittal image 57 of series  10. Despite this the right ICA remains patent. The vessel is mildly tortuous with a partially retropharyngeal course. Left carotid system: Bovine left CCA origin without stenosis. Mild calcified plaque at the left carotid bifurcation, widely patent left ICA origin and bulb. Tortuous but otherwise negative cervical left ICA, with a partially retropharyngeal course. Vertebral arteries:No proximal right subclavian artery stenosis despite calcified plaque. Calcified plaque near the right vertebral artery origin but without origin stenosis. However, there is soft plaque affecting the right V1 segment with moderate stenosis best seen on series 9, image 144. Occasionally tortuous right vertebral the 2 segment, and occasional calcified plaque but no other cervical right vertebral artery stenosis. No proximal left subclavian artery stenosis. Mildly dominant left vertebral artery with no origin stenosis despite soft plaque. V3 segment calcified plaque but no cervical left vertebral artery stenosis. CTA HEAD Posterior circulation: Bilateral calcified plaque affecting the vertebral arteries at the skullbase, however, a moderate to severe bilateral distal vertebral artery stenosis occurs due to soft plaque. Subsequent high-grade stenosis in both distal vertebral arteries and at the vertebrobasilar junction. Moderately to severely irregular basilar artery with additional tandem stenoses. However, the basilar remains patent. Severe irregularity and stenosis at both PCA origins and also affecting the SCA origins to a lesser extent. Posterior communicating arteries might be present but are diminutive and irregular. Bilateral PCA branches are severely irregular and stenotic, worse on the right. Anterior circulation: Proximal ICA siphons are patent without plaque. However, there is moderate to severe calcified plaque from the cavernous segments to the supraclinoid ICAs. On the right this results in high-grade supraclinoid ICA stenosis  best seen on series 8, image 111. There is moderate stenosis on the left. There is additional moderate to severe distal right ICA/terminus stenosis related to calcified plaque best seen on series 8, image 110. Despite these findings both carotid termini are patent. There is irregularity at both MCA and ACA origins which remain patent. Severe stenosis of the right A1.  The anterior communicating artery is present. Moderate to severe bilateral A2 segment irregularity and multifocal stenoses, best seen on series 13, image 18. Left MCA origin is patent with mild irregularity. Mild left M1 irregularity, with moderate Left MCA bifurcation is patent multifocal proximal M2 branch irregularity and stenosis best seen on series 11, image 22. On the right the MCA origin is mildly stenotic. The M1 segment is mildly to moderately irregular. The right MCA bifurcation is. There is moderate multifocal irregularity and stenosis in the dominant anterior M2 branch with attenuated anterior division MCA enhancement. Mild posterior division vessel irregularity. Venous sinuses: Patent. Anatomic variants: Bovine arch configuration. Delayed phase: No abnormal enhancement identified. Recently seen scattered small right MCA infarcts remain largely occult on CT. No acute intracranial hemorrhage identified. No intracranial mass effect. IMPRESSION: 1. Radiographic String Sign Stenosis at the right ICA origin related to Low-density plaque or thrombus, however, there is superimposed severe intracranial atherosclerosis including high-grade distal right ICA stenosis in part due to bulky calcified plaque. 2. Widespread intracranial moderate to severe first and second order vessel irregularity and stenosis, including severe stenoses at the: -bilateral distal vertebral arteries, - proximal basilar artery, - bilateral PCAs, - right ACA A1, - bilateral ACA distal A2 segments. 3. No intracranial hemorrhage or mass effect. Salient findings discussed by  telephone with Neurology NP Burnetta Sabin on 10/07/2015 at 1142 hours. Electronically Signed   By: Genevie Ann M.D.   On: 10/07/2015 11:45   Medical Consultants:    Neurology  Anti-Infectives:   Anti-infectives    None      Subjective:   [name] denies complaints of pain, nausea, or dyspnea. Still has some left-sided lower extremity weakness. No complaints of speech difficulty or swallowing trouble.  Objective:    Filed Vitals:   10/07/15 1821 10/07/15 2214 10/08/15 0128 10/08/15 0620  BP: 188/95 194/98 184/94 189/96  Pulse: 84 79 86 90  Temp: 98.2 F (36.8 C) 97.5 F (36.4 C) 98.1 F (36.7 C) 98 F (36.7 C)  TempSrc: Axillary Oral Oral Oral  Resp: 20 20 20 20   Height:      Weight:      SpO2: 100% 100% 100% 100%    Intake/Output Summary (Last 24 hours) at 10/08/15 0739 Last data filed at 10/08/15 [id number]  Gross per 24 hour  Intake   1080 ml  Output    800 ml  Net    280 ml   Filed Weights   10/07/15 0028  Weight: 76.386 kg (168 lb 6.4 oz)    Exam: Gen:  NAD Cardiovascular:  RRR, No M/R/G Respiratory:  Lungs CTAB Gastrointestinal:  Abdomen soft, NT/ND, + BS Extremities:  No C/E/C Neuro: Subtle left upper extremity weakness, more pronounced left lower extremity weakness   Data Reviewed:    Labs: Basic Metabolic Panel:  Recent Labs Lab 10/06/15 1542 10/06/15 1547  NA 138 142  K 3.5 3.6  CL 102 102  CO2 26  --   GLUCOSE 76 78  BUN 14 18  CREATININE 1.10 1.00  CALCIUM 9.1  --    GFR Estimated Creatinine Clearance: 59.4 mL/min (by C-G formula based on Cr of 1). Liver Function Tests:  Recent Labs Lab 10/06/15 1542  AST 22  ALT 12*  ALKPHOS 57  BILITOT 0.5  PROT 7.6  ALBUMIN 3.5   Coagulation profile  Recent Labs Lab 10/06/15 1542  INR 1.13    CBC:  Recent Labs Lab 10/06/15 1542 10/06/15 1547 10/08/15 0007  WBC 4.5  --  5.6  NEUTROABS 2.9  --   --   HGB 12.6* 15.6 12.2*  HCT 39.8 46.0 38.4*  MCV 68.4*  --  68.4*  PLT  181  --  178   CBG:  Recent Labs Lab 10/06/15 1646  GLUCAP 61*   Microbiology No results found for this or any previous visit (from the past 240 hour(s)).   Medications:   . aspirin  300 mg Rectal Daily   Or  . aspirin  325 mg Oral Daily  . atorvastatin  80 mg Oral Daily  . [START ON 10/09/2015] enoxaparin (LOVENOX) injection  40 mg Subcutaneous Daily  . furosemide  20 mg Oral Daily  . sodium chloride flush  3 mL Intravenous Q12H   Continuous Infusions:   Time spent: 25 minutes.   LOS: 2 days   RAMA,CHRISTINA  [facility] Pager [phone]. If unable to reach me by pager, please call my cell phone at [phone].  *Please refer to [email or web address], password [email or web address] to get updated schedule on who will round on this patient, as hospitalists switch teams weekly. If 7PM-7AM, please contact night-coverage at [email or web address], password [email or web address] for any overnight needs.  10/08/2015, 7:39 AM

## 2015-10-08 NOTE — Progress Notes (Signed)
OT Cancellation Note  Patient Details Name: [name] MRN: [id number] DOB: 1940-10-18   Cancelled Treatment:    Reason Eval/Treat Not Completed:  (Pt on bed rest.)  [name] OTR/L [id number] 10/08/2015, 1:57 PM

## 2015-10-08 NOTE — Sedation Documentation (Signed)
Patient is resting comfortably. 

## 2015-10-08 NOTE — Care Management Important Message (Signed)
Important Message  Patient Details  Name: [name] MRN: [id number] Date of Birth: 24-Jan-1941   Medicare Important Message Given:  Yes    Loann Quill 10/08/2015, 8:37 AM

## 2015-10-09 ENCOUNTER — Encounter (HOSPITAL_COMMUNITY): Payer: Self-pay | Admitting: Cardiology

## 2015-10-09 DIAGNOSIS — Z01810 Encounter for preprocedural cardiovascular examination: Secondary | ICD-10-CM

## 2015-10-09 HISTORY — DX: Encounter for preprocedural cardiovascular examination: Z01.810

## 2015-10-09 LAB — RAPID URINE DRUG SCREEN, HOSP PERFORMED
Amphetamines: NOT DETECTED
Barbiturates: NOT DETECTED
Benzodiazepines: POSITIVE — AB
Cocaine: NOT DETECTED
Opiates: NOT DETECTED
Tetrahydrocannabinol: NOT DETECTED

## 2015-10-09 LAB — POCT ACTIVATED CLOTTING TIME: Activated Clotting Time: 173 seconds

## 2015-10-09 MED ORDER — EZETIMIBE 10 MG PO TABS
10.0000 mg | ORAL_TABLET | Freq: Every day | ORAL | Status: DC
  Administered 2015-10-09 – 2015-10-14 (×5): 10 mg via ORAL
  Filled 2015-10-09 (×5): qty 1

## 2015-10-09 MED ORDER — DEXTROSE 5 % IV SOLN
1.5000 g | INTRAVENOUS | Status: AC
  Administered 2015-10-10: 1.5 g via INTRAVENOUS
  Filled 2015-10-09: qty 1.5

## 2015-10-09 MED ORDER — IBUPROFEN 100 MG/5ML PO SUSP
ORAL | Status: AC
  Filled 2015-10-09: qty 5

## 2015-10-09 MED ORDER — "THROMBI-PAD 3""X3"" EX PADS"
1.0000 | MEDICATED_PAD | Freq: Once | CUTANEOUS | Status: DC
  Filled 2015-10-09: qty 1

## 2015-10-09 NOTE — Progress Notes (Signed)
PT Cancellation Note  Patient Details Name: [name] MRN: [id number] DOB: January 04, 1941   Cancelled Treatment:    Reason Eval/Treat Not Completed: Medical issues which prohibited therapy. On bedrest per progress notes and discussion with RN.    Malerie Eakins 10/09/2015, 8:41 AM  Pager [phone]

## 2015-10-09 NOTE — Progress Notes (Signed)
    Subjective  -  No complaints this am   Physical Exam:  CV:RRR Pulm:CTA Neuro intact except some left leg issues       Assessment/Plan:   Cardiac clearance pending.  Will plan for Right CEA tomorrow if clear by cardiology.  If too high risk for general anesthesia, will plan carotid stent.  NPO after midnight  Whitleigh Garramone, [place] 10/09/2015 3:25 PM --  Filed Vitals:   10/09/15 1041 10/09/15 1410  BP: 181/78 169/82  Pulse: 82 76  Temp: 97.8 F (36.6 C) 98 F (36.7 C)  Resp: 16 20    Intake/Output Summary (Last 24 hours) at 10/09/15 1525 Last data filed at 10/08/15 2257  Gross per 24 hour  Intake    360 ml  Output    600 ml  Net   -240 ml     Laboratory CBC    Component Value Date/Time   WBC 5.6 10/08/2015 0007   HGB 12.2* 10/08/2015 0007   HCT 38.4* 10/08/2015 0007   PLT 178 10/08/2015 0007    BMET    Component Value Date/Time   NA 142 10/06/2015 1547   K 3.6 10/06/2015 1547   CL 102 10/06/2015 1547   CO2 26 10/06/2015 1542   GLUCOSE 78 10/06/2015 1547   BUN 18 10/06/2015 1547   CREATININE 1.00 10/06/2015 1547   CALCIUM 9.1 10/06/2015 1542   GFRNONAA >60 10/06/2015 1542   GFRAA >60 10/06/2015 1542    COAG Lab Results  Component Value Date   INR 1.13 10/06/2015   INR 1.16 06/19/2015   INR 1.04 11/19/2011   No results found for: PTT  Antibiotics Anti-infectives    Start     Dose/Rate Route Frequency Ordered Stop   10/10/15 1000  cefUROXime (ZINACEF) 1.5 g in dextrose 5 % 50 mL IVPB     1.5 g 100 mL/hr over 30 Minutes Intravenous To ShortStay Surgical 10/09/15 1127 10/11/15 1000       V. Leia Alf, M.D. [facility] Office: [phone] Pager:  [phone]

## 2015-10-09 NOTE — Consult Note (Addendum)
Admit date: 10/06/2015 Referring Physician  Dr. Clementeen Graham Primary Physician  Dr. Cathlean Cower Primary Cardiologist  Dr. Lia Foyer Reason for Consultation  Preop cardiac clearance for carotid artery stenosis  HPI: [name] is a 76 y.o. male with PMH of hypertension, hyperlipidemia, coronary artery disease, BPH, and paroxysmal atrial fibrillation who presented to the ED with 1 day of weakness and numbness involving the left leg. Patient notes that  for approximately 24 hours prior to his arrival, the left leg had been numb and weak, making it difficult to put his pants on and ambulate. He also notes a vague sensation of "just not feeling quite right" for the past day.  He denies any chest pain or palpitations  In ED, patient was found to be afebrile, saturating well on room air, and with blood pressure in the 220/100 range. Noncontrast head CT was negative for any acute intracranial abnormality.  EKG showed a normal sinus rhythm with LVH by voltage criteria and troponin is 0.02. Despite the negative head CT, MRI demonstrated acute ischemic infarcts of the cortical gray and deep white matter of the right frontal, right parietal, and right occipital lobes. There is a subtle flow void noted in the distal right ICA concerning for stenosis.   Cardiology is now asked to consult for preoperative cardiac clearance for carotid artery stenosis.  The patient is fairly sedentary.  He does not really walk for exercise.  He denies any chest pain or pressure with daily activities or going up stairs.  He denies any SOB or DOE.  He denies any palpitations or syncope.    PMH:   Past Medical History  Diagnosis Date  . Hypertension     echocardiogram 6/10: Moderate LVH, EF 55-65%, mild, mild MR, MAC, mild LAE, PASP 32  . Dyslipidemia   . History of stroke   . CAD (coronary artery disease)     cath in 1/08: EF 60%, mild dilated Ao root, oD2 30%, LAD 20-30%.  . Paroxysmal atrial fibrillation ([place])   . Anemia,  iron deficiency 08/15/2011  . History of pneumonia 08/15/2011  . History of MI (myocardial infarction) 08/15/2011  . Dilated aortic root ([place]) 08/15/2011  . Stroke ([place]) 10/2011  . Myocardial infarction ([place])   . Hyperlipidemia   . Shortness of breath dyspnea   . Prediabetes 10/08/2015     PSH:   Past Surgical History  Procedure Laterality Date  . Cardiac catheterization      Allergies:  Review of patient's allergies indicates no known allergies. Prior to Admit Meds:   Prescriptions prior to admission  Medication Sig Dispense Refill Last Dose  . albuterol (PROVENTIL HFA;VENTOLIN HFA) 108 (90 BASE) MCG/ACT inhaler Inhale 2 puffs into the lungs every 6 (six) hours as needed for wheezing or shortness of breath. (Patient not taking: Reported on 10/07/2015) 1 Inhaler 2   . amitriptyline (ELAVIL) 50 MG tablet Take 1 tablet (50 mg total) by mouth at bedtime as needed for sleep. (Patient not taking: Reported on 01/21/2015) 90 tablet 1 Not Taking at Unknown time  . amLODipine (NORVASC) 10 MG tablet Take 1 tablet (10 mg total) by mouth daily. (Patient not taking: Reported on 06/19/2015) 30 tablet 0 Not Taking at Unknown time  . aspirin EC 81 MG tablet Take 1 tablet (81 mg total) by mouth daily. (Patient not taking: Reported on 10/07/2015) 30 tablet 0   . atorvastatin (LIPITOR) 80 MG tablet Take 1 tablet (80 mg total) by mouth daily. (Patient not  taking: Reported on 01/21/2015) 90 tablet 3 Not Taking at Unknown time  . carvedilol (COREG) 3.125 MG tablet Take 1 tablet (3.125 mg total) by mouth 2 (two) times daily with a meal. (Patient not taking: Reported on 10/07/2015) 60 tablet 0   . furosemide (LASIX) 20 MG tablet Take 1 tablet (20 mg total) by mouth daily. (Patient not taking: Reported on 10/07/2015) 30 tablet 0   . isosorbide mononitrate (IMDUR) 30 MG 24 hr tablet Take 1 tablet (30 mg total) by mouth daily. (Patient not taking: Reported on 10/07/2015) 30 tablet 0   . lisinopril (PRINIVIL,ZESTRIL) 5 MG tablet Take 1  tablet (5 mg total) by mouth daily. (Patient not taking: Reported on 10/07/2015) 30 tablet 0   . tadalafil (CIALIS) 20 MG tablet Take 1 tablet (20 mg total) by mouth daily as needed for erectile dysfunction. (Patient not taking: Reported on 01/21/2015) 5 tablet 11 Not Taking at Unknown time   Fam HX:    Family History  Problem Relation Age of Onset  . Colon cancer Neg Hx   . Aneurysm Sister    Social HX:    Social History   Social History  . Marital Status: Widowed    Spouse Name: N/A  . Number of Children: 2  . Years of Education: N/A   Occupational History  . retired    Social History Main Topics  . Smoking status: Never Smoker   . Smokeless tobacco: Never Used  . Alcohol Use: No  . Drug Use: No  . Sexual Activity: Not on file   Other Topics Concern  . Not on file   Social History Narrative     ROS:  All 11 ROS were addressed and are negative except what is stated in the HPI  Physical Exam: Blood pressure 181/78, pulse 82, temperature 97.8 F (36.6 C), temperature source Oral, resp. rate 16, height 5\' 6"  (1.676 m), weight 168 lb 6.4 oz (76.386 kg), SpO2 100 %.    General: Well developed, well nourished, in no acute distress Head: Eyes PERRLA, No xanthomas.   Normal cephalic and atramatic  Lungs:   Clear bilaterally to auscultation and percussion. Heart:   HRRR S1 S2 Pulses are 2+ & equal.            No carotid bruit. No JVD.  No abdominal bruits. No femoral bruits. Abdomen: Bowel sounds are positive, abdomen soft and non-tender without masses  Extremities:   No clubbing, cyanosis or edema.  DP +1 Neuro: Alert and oriented X 3. Psych:  Good affect, responds appropriately    Labs:   Lab Results  Component Value Date   WBC 5.6 10/08/2015   HGB 12.2* 10/08/2015   HCT 38.4* 10/08/2015   MCV 68.4* 10/08/2015   PLT 178 10/08/2015    Recent Labs Lab 10/06/15 1542 10/06/15 1547  NA 138 142  K 3.5 3.6  CL 102 102  CO2 26  --   BUN 14 18  CREATININE 1.10  1.00  CALCIUM 9.1  --   PROT 7.6  --   BILITOT 0.5  --   ALKPHOS 57  --   ALT 12*  --   AST 22  --   GLUCOSE 76 78   No results found for: PTT Lab Results  Component Value Date   INR 1.13 10/06/2015   INR 1.16 06/19/2015   INR 1.04 11/19/2011   Lab Results  Component Value Date   CKTOTAL 276* 11/19/2011   CKMB 4.2* 11/19/2011  TROPONINI 0.04* 06/20/2015     Lab Results  Component Value Date   CHOL 262* 10/07/2015   CHOL 284* 06/19/2015   CHOL 294* 06/27/2014   Lab Results  Component Value Date   HDL 32* 10/07/2015   HDL 53 06/19/2015   HDL 36.60* 06/27/2014   Lab Results  Component Value Date   LDLCALC 206* 10/07/2015   LDLCALC 219* 06/19/2015   LDLCALC 233* 06/27/2014   Lab Results  Component Value Date   TRIG 118 10/07/2015   TRIG 59 06/19/2015   TRIG 122.0 06/27/2014   Lab Results  Component Value Date   CHOLHDL 8.2 10/07/2015   CHOLHDL 5.4 06/19/2015   CHOLHDL 8 06/27/2014   Lab Results  Component Value Date   LDLDIRECT 230.0 01/20/2013   LDLDIRECT 200.5 04/01/2011   LDLDIRECT 212.5 10/07/2006      Radiology:  Ir Angio Intra Extracran Sel Com Carotid Innominate Bilat Mod Sed  10/08/2015  CLINICAL DATA:  Left-sided weakness, leg greater than arm. Abnormal CT angiogram of the brain and neck. EXAM: BILATERAL COMMON CAROTID AND INNOMINATE ANGIOGRAPHY AND BILATERAL VERTEBRAL ARTERY ANGIOGRAMS PROCEDURE: Contrast:  60 ml 71mL OMNIPAQUE IOHEXOL 300 MG/ML  SOLN Anesthesia/Sedation:  Conscious sedation. Medications: Versed at 1 mg IV.  Fentanyl 25 mcg IV. Following a full explanation of the procedure along with the potential associated complications, an informed witnessed consent was obtained. The right groin was prepped and draped in the usual sterile fashion. Thereafter using modified Seldinger technique, transfemoral access into the right common femoral artery was obtained without difficulty. Over a 0.035 inch guidewire, a 5 French Pinnacle sheath was  inserted. Through this, and also over 0.035 inch guidewire, a 5 [place] JB 1 catheter was advanced to the aortic arch region and selectively positioned in the right common carotid artery, the right subclavian artery, the left common carotid artery and the left subclavian artery. There were no acute complications. The patient tolerated the procedure well. FINDINGS: The right common carotid arteriogram demonstrates the right external carotid artery to be mildly narrowed proximal. Its branches are normally opacified otherwise. The right internal carotid artery has severe pre occlusive stenosis on the lateral, likely a string sign with mild poststenotic dilatation. There is mild tortuosity of the junction of the proximal 1/3 and middle 1/3 of the cervical right ICA. More distally, the vessel is seen to opacify normally to the cranial skull base. The petrous and the proximal cavernous segments are widely patent. There is an approximately 50% stenosis of the caval cavernous segment, with approximately 80-85% narrowing of the supraclinoid right ICA. The right middle cerebral artery distribution is seen to opacify into the capillary and the venous phases. Focal areas of caliber narrowing with irregularity represent intracranial arteriosclerotic disease. There is no angiographic visualization of the right anterior cerebral A1 segment. The right vertebral artery origin is patent. The vessel is seen to opacify to the cranial skull base where it opacifies the left posterior-inferior cerebellar artery and the left vertebrobasilar junction. Flow is seen into the distal basilar artery, the posterior cerebral arteries and superior cerebellar arteries. Extreme attenuation of the contrast in the proximal basilar artery is seen. The left vertebral artery origin is normal. The vessel is seen to opacify to the cranial skull base. There is an approximately 50% narrowing of the left vertebrobasilar junction just proximal to the left  posterior-inferior cerebellar artery. High-grade 75 plus stenosis seen of the left vertebrobasilar junction just distal to the origin of the left posterior inferior  cerebellar artery. Additionally there is approximately 70% narrowing of the proximal basilar artery. The visualized posterior cerebral arteries, superior cerebellar arteries and the anterior-inferior cerebellar arteries including the left posterior inferior cerebellar artery demonstrated focal areas of caliber narrowing with irregularity most consistent with intracranial arteriosclerosis. The left common carotid arteriogram demonstrates the left external carotid artery and its major branches to be widely patent. The left internal carotid artery at the bulb demonstrates minimal plaque without associated ulcerations. Mild tortuosity is seen of the junction of the middle and the proximal 1/3 of the left internal carotid artery. More distally the vessel is seen to opacify to the cranial skull base. The petrous segment is widely patent. The cavernous segment demonstrates mild focal areas of caliber irregularity with approximately 50% narrowing of the distal cavernous segment. The supraclinoid segment appears widely patent. The left middle and the left anterior cerebral arteries are seen to opacify into the capillary and the venous phases. Scattered focal areas of caliber irregularity with narrowing suggest intracranial arteriosclerosis. Also seen is simultaneous opacification via the anterior communicating artery of the right anterior cerebral A2 segment and also an attenuated distal right A1 segment. There appears to be contrast leaking into the right middle cerebral artery at the proximal A1 segment. The right A2 segment demonstrates focal areas of caliber irregularity consistent with intracranial arteriosclerosis. 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 Approximately 85%  narrowing of the right internal carotid artery supraclinoid segment, and 50% narrowing of the right internal carotid artery cavernous segment. Approximately 75% stenosis of the left vertebrobasilar junction just distal to the origin of the left posterior-inferior cerebellar artery, and approximately 70% narrowing of the proximal basilar artery. Approximately 50% narrowing of the left internal carotid artery cavernous segment. 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 Electronically Signed   By: Luanne Bras M.D.   On: 10/08/2015 11:34   Ir Angio Vertebral Sel Subclavian Innominate Bilat Mod Sed  10/08/2015  CLINICAL DATA:  Left-sided weakness, leg greater than arm. Abnormal CT angiogram of the brain and neck. EXAM: BILATERAL COMMON CAROTID AND INNOMINATE ANGIOGRAPHY AND BILATERAL VERTEBRAL ARTERY ANGIOGRAMS PROCEDURE: Contrast:  60 ml 37mL OMNIPAQUE IOHEXOL 300 MG/ML  SOLN Anesthesia/Sedation:  Conscious sedation. Medications: Versed at 1 mg IV.  Fentanyl 25 mcg IV. Following a full explanation of the procedure along with the potential associated complications, an informed witnessed consent was obtained. The right groin was prepped and draped in the usual sterile fashion. Thereafter using modified Seldinger technique, transfemoral access into the right common femoral artery was obtained without difficulty. Over a 0.035 inch guidewire, a 5 French Pinnacle sheath was inserted. Through this, and also over 0.035 inch guidewire, a 5 [place] JB 1 catheter was advanced to the aortic arch region and selectively positioned in the right common carotid artery, the right subclavian artery, the left common carotid artery and the left subclavian artery. There were no acute complications. The patient tolerated the procedure well. FINDINGS: The right common carotid arteriogram demonstrates the right external carotid artery to be mildly narrowed proximal. Its branches are normally opacified  otherwise. The right internal carotid artery has severe pre occlusive stenosis on the lateral, likely a string sign with mild poststenotic dilatation. There is mild tortuosity of the junction of the proximal 1/3 and middle 1/3 of the cervical right ICA. More distally, the vessel is seen to opacify normally to the cranial skull base. The petrous and the proximal cavernous  segments are widely patent. There is an approximately 50% stenosis of the caval cavernous segment, with approximately 80-85% narrowing of the supraclinoid right ICA. The right middle cerebral artery distribution is seen to opacify into the capillary and the venous phases. Focal areas of caliber narrowing with irregularity represent intracranial arteriosclerotic disease. There is no angiographic visualization of the right anterior cerebral A1 segment. The right vertebral artery origin is patent. The vessel is seen to opacify to the cranial skull base where it opacifies the left posterior-inferior cerebellar artery and the left vertebrobasilar junction. Flow is seen into the distal basilar artery, the posterior cerebral arteries and superior cerebellar arteries. Extreme attenuation of the contrast in the proximal basilar artery is seen. The left vertebral artery origin is normal. The vessel is seen to opacify to the cranial skull base. There is an approximately 50% narrowing of the left vertebrobasilar junction just proximal to the left posterior-inferior cerebellar artery. High-grade 75 plus stenosis seen of the left vertebrobasilar junction just distal to the origin of the left posterior inferior cerebellar artery. Additionally there is approximately 70% narrowing of the proximal basilar artery. The visualized posterior cerebral arteries, superior cerebellar arteries and the anterior-inferior cerebellar arteries including the left posterior inferior cerebellar artery demonstrated focal areas of caliber narrowing with irregularity most consistent  with intracranial arteriosclerosis. The left common carotid arteriogram demonstrates the left external carotid artery and its major branches to be widely patent. The left internal carotid artery at the bulb demonstrates minimal plaque without associated ulcerations. Mild tortuosity is seen of the junction of the middle and the proximal 1/3 of the left internal carotid artery. More distally the vessel is seen to opacify to the cranial skull base. The petrous segment is widely patent. The cavernous segment demonstrates mild focal areas of caliber irregularity with approximately 50% narrowing of the distal cavernous segment. The supraclinoid segment appears widely patent. The left middle and the left anterior cerebral arteries are seen to opacify into the capillary and the venous phases. Scattered focal areas of caliber irregularity with narrowing suggest intracranial arteriosclerosis. Also seen is simultaneous opacification via the anterior communicating artery of the right anterior cerebral A2 segment and also an attenuated distal right A1 segment. There appears to be contrast leaking into the right middle cerebral artery at the proximal A1 segment. The right A2 segment demonstrates focal areas of caliber irregularity consistent with intracranial arteriosclerosis. 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 Approximately 85% narrowing of the right internal carotid artery supraclinoid segment, and 50% narrowing of the right internal carotid artery cavernous segment. Approximately 75% stenosis of the left vertebrobasilar junction just distal to the origin of the left posterior-inferior cerebellar artery, and approximately 70% narrowing of the proximal basilar artery. Approximately 50% narrowing of the left internal carotid artery cavernous segment. 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 Electronically Signed   By:  Luanne Bras M.D.   On: 10/08/2015 11:34    EKG:  NSR with inferior and anterolateral T wave inversions  ASSESSMENT/PLAN:   1.  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  2.  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  He has not had any anginal symptoms but is not very active.  After questioning him, he most likely does not achieve 4 mets  in exercise.  He is able to so some daily activities without any problems.  His EKG shows new T wave inversions in the inferior leads.  Given his cardiac risk factors including PVD, HTN, hyperlipidemia and history of nonobstructive ASCAD, further assessment for progression of CAD is warrented.  There is some concern for giving Lexiscan in the setting of severe carotid artery stenosis and intracranial disease if patient has hypotensive response dropping cerbral perfusion pressure.  His BP is stable right now.  Will start with Coronary CTA with morphology and if inconclusive then will need Lexiscan myoview.  3.  HTN - poorly controlled.  Will leave up to Neuro/TRH to manage  4.  Dyslipidemia - LDL poorly controlled at 206.  He is already on high dose statin.  Will add Zetia 10mg  daily.    5.  Nonobstructive ASCAD - continue ASA/statin/BB/oral nitrates/ACE I.  Need to d/c Cialis since he is on long acting nitrates at home.    6.  PAF - he was not on longterm anticoagulation at home for unclear reasons.  This patients CHA2DS2-VASc Score and unadjusted Ischemic Stroke Rate (% per year) is equal to 9.8 % stroke rate/year from a score of 6. Above score calculated as 1 point each if present [CHF, HTN, DM, Vascular=MI/PAD/Aortic Plaque, Age if 65-74, or Male].  Above score calculated as 2 points each if present [Age > 75, or Stroke/TIA/TE].  He should be placed on  NOAC post op for cardioembolic prevention going forward.   Sueanne Margarita, MD  10/09/2015  12:29 PM

## 2015-10-09 NOTE — Progress Notes (Signed)
On assessment, r groin effusing bright red blood, dressings saturated. Removed, replaced with clean gauze, hypafix, sand bag placed. Pedal pulses present equal. Advised pt to remain flat in bed until instructed otherwise. Hob at 15. No discomfort reported.

## 2015-10-09 NOTE — Progress Notes (Signed)
STROKE TEAM PROGRESS NOTE   SUBJECTIVE (INTERVAL HISTORY) Dr. Leonie [name] discussed diagnosis, prognosis,  treatment options and plan of care with patient.i discussed case with Dr [name] who felt he could do Rt CEA in next few days after getting cardiology clerance    OBJECTIVE Temp:  [97.8 F (36.6 C)-98 F (36.7 C)] 97.8 F (36.6 C) (03/08 1041) Pulse Rate:  [74-84] 82 (03/08 1041) Cardiac Rhythm:  [-] Normal sinus rhythm (03/08 0807) Resp:  [16-20] 16 (03/08 1041) BP: (125-182)/(70-85) 181/78 mmHg (03/08 1041) SpO2:  [98 %-100 %] 100 % (03/08 1041)  CBC:   Recent Labs Lab 10/06/15 1542 10/06/15 1547 10/08/15 0007  WBC 4.5  --  5.6  NEUTROABS 2.9  --   --   HGB 12.6* 15.6 12.2*  HCT 39.8 46.0 38.4*  MCV 68.4*  --  68.4*  PLT 181  --  0000000    Basic Metabolic Panel:   Recent Labs Lab 10/06/15 1542 10/06/15 1547  NA 138 142  K 3.5 3.6  CL 102 102  CO2 26  --   GLUCOSE 76 78  BUN 14 18  CREATININE 1.10 1.00  CALCIUM 9.1  --     Lipid Panel:     Component Value Date/Time   CHOL 262* 10/07/2015 1114   TRIG 118 10/07/2015 1114   HDL 32* 10/07/2015 1114   CHOLHDL 8.2 10/07/2015 1114   VLDL 24 10/07/2015 1114   LDLCALC 206* 10/07/2015 1114   HgbA1c:  Lab Results  Component Value Date   HGBA1C 6.2* 10/07/2015   Urine Drug Screen:     Component Value Date/Time   LABOPIA NEGATIVE 01/01/2009 0600   COCAINSCRNUR NEGATIVE 01/01/2009 0600   LABBENZ NEGATIVE 01/01/2009 0600   AMPHETMU NEGATIVE 01/01/2009 0600      IMAGING  Ct Head Wo Contrast 10/06/2015  1.  No acute intracranial abnormality. 2.  Cerebral atrophy and small vessel ischemic change. 3. Sinus disease.    Ct Angio Head & Neck W/cm &/or Wo/cm 10/07/2015  1. Radiographic String Sign Stenosis at the right ICA origin related to Low-density plaque or thrombus, however, there is superimposed severe intracranial atherosclerosis including high-grade distal right ICA stenosis in part due to bulky calcified  plaque. 2. Widespread intracranial moderate to severe first and second order vessel irregularity and stenosis, including severe stenoses at the: -bilateral distal vertebral arteries, - proximal basilar artery, - bilateral PCAs, - right ACA A1, - bilateral ACA distal A2 segments. 3. No intracranial hemorrhage or mass effect.   Mr Brain Wo Contrast 10/06/2015  1. Patchy acute ischemic nonhemorrhagic infarcts involving the cortical gray matter and deep white matter of the right frontal, right parietal, and right occipital lobes. No associated mass effect. 2. Subtle abnormal flow void within the distal right ICA, which may reflect underlying atheromatous disease, slow flow, and/or partial occlusion. 3. Generalized cerebral atrophy with moderate chronic small vessel ischemic disease. 4. Mild paranasal sinus disease as above.   2D Echocardiogram  - Left ventricle: The cavity size was normal. Wall thickness wasincreased in a pattern of severe LVH. Systolic function wasnormal. The estimated ejection fraction was in the range of 55%to 60%. Left ventricular diastolic function parameters werenormal. - Aortic valve: There was mild regurgitation. - Mitral valve: There was mild regurgitation. - Left atrium: The atrium was moderately dilated.  Cerebral Angiogram 10/08/2015 1.Preocclusive stenosis of RT ICA prox . 2.Approx 80 % stenosis of RT ICA supraclinoid seg and 50 % of cavernous seg. 3.Occluded RT ACA A1  seg. 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 . Afebrile. Head is nontraumatic. Neck is supple without bruit.    Cardiac exam no murmur or gallop. Lungs are clear to auscultation. Distal pulses are well felt. Neurological Exam :  Awake alert oriented x 3 normal speech and language. Mild left lower face asymmetry. Tongue midline. No drift. Mild diminished fine finger  movements on left. Orbits right over left upper extremity. Mild left grip weak.. Normal sensation . Normal coordination.   ASSESSMENT/PLAN [name] is a 75 y.o. male with history of hypertension, stroke, myocardial infarction, atrial fibrillation presenting with left leg weakness. He did not receive IV t-PA due to delay in arrival.   Stroke:  patchy right frontal, right parietal and right occipital infarcts, likely secondary to large vessel disease source  MRI  patchy right frontal, right parietal and right occipital infarcts  CTA angio head and neck right ICA origin string sign with superimposed severe intracranial atherosclerosis including high-grade right ICA stenosis due to bulky calcified plaque.  2D Echo  No source of embolus   Cerebral angiogram R supraclinoid ICA 90%, cavernous 50%. Occluded R A1. 70% L VBJ/prox BA  LDL 206  HgbA1c 6.2  Lovenox 40 mg sq daily for VTE prophylaxis Diet regular Room service appropriate?: Yes; Fluid consistency:: Thin  aspirin 81 mg daily prior to admission, now on aspirin 325 mg daily  Patient counseled to be compliant with his antithrombotic medications  Ongoing aggressive stroke risk factor management  Therapy recommendations:  pending   Disposition:  pending   Intra and Extracranial stenoses  Cerebral angiogram R supraclinoid ICA 90%, cavernous 50%. Occluded R A1. 70% L VBJ/prox BA  Dr. Leonie [name] has contacted [facility]  Consideration of surgery/stent (Dr. Trula [name])  Atrial Fibrillation  Home anticoagulation:  aspirin 81 mg daily   In note from 2013 stated not on anticoagulation d/t anemia, colonscopy with internal hemorrhoids  11/2011 - subacute stroke found on evaluation of dizziness - pt with afib. Coumadin recommended Seen by neurology ([name])   Hypertension  Elevated, but acceptable Permissive hypertension (OK if < 220/120) but gradually normalize in 5-7 days  Hyperlipidemia  Home meds:  Lipitor 80, resumed in  hospital  LDL 206, goal < 70  Continue statin at discharge  Other Stroke Risk Factors  Advanced age  Hx stroke/TIA  11/2011 - subacute stroke found on evaluation of dizziness. Coumadin recommended Seen by neurology ([name])  Coronary artery disease  Other Active Problems  Chronic kidney disease stage III  BPH  Hospital day # Vici Sharp for Pager information 10/09/2015 1:02 PM  I have personally examined this patient, reviewed notes, independently viewed imaging studies, participated in medical decision making and plan of care. I have made any additions or clarifications directly to the above note. Agree with note above. The patient catheter angiogram shows a radiographic string sign in the proximal right ICA as well as 80% right cavernous carotid stenosis as well with additional multivessel stenosis intracranially. This represents a high risk situation with patient being at significant risk for carotid occlusion, strokes, neurological worsening. He may possibly benefit with carotid revascularization.Dr [name] agreeable   For urgent right carotid revascularization after cardiology clearanceThis will have to be done soon preferably prior to discharge him home.   Craig Contras, MD Medical  Director [facility] Pager: [phone] 10/09/2015 1:02 PM   To contact Stroke Continuity provider, please refer to [email or web address] After hours, contact General Neurology

## 2015-10-09 NOTE — Progress Notes (Signed)
[facility] PROGRESS NOTE  [name] [id number] DOB: 12-29-1940 DOA: 10/06/2015 PCP: Cathlean Cower, MD  Brief narrative On 75 year old male with history of hypertension, prior MI, prior CVA and A. fib not on anticoagulation due to history of anemia admitted with one-day history of left leg weakness and numbness. On presentation patient was hypertensive to 220/100 mmHg. Head CT was unremarkable for acute abnormality. MRI of the brain showed acute ischemic infarct of the right frontal, parietal and occipital lobes. Admitted to hospitalist service for stroke workup and management. Neurology following.  Assessment/Plan: Acute ischemic stroke MRI brain shows patchy right frontal, parietal and occipital infarcts. CT angiogram of the head and neck showed high-grade right ICA stenosis with bulky calcified plaque. 2-D echo was unremarkable without any source of embolus. A cerebral angiogram was done showed 90% right supraclinoid stenosis, 50% cavernous stenosis and 70% stenosis at the left vertebrobasilar junction. LDL of 206 and A1c of 6.2. Switch to full dose aspirin daily. Continue Lipitor and Zetia. Allow permissive hypertension. Vascular surgery consulted and recommends carotid revascularization. -Cardiology consulted for preoperative clearance. Patient's EKG has new T-wave inversion inferior leads. Pending his significant CAD recommend coronary CT angiogram and if inconclusive patient will need a Lexiscan Myoview. -PT/OT evaluation  Coronary artery disease Continue aspirin, statin  paroxysmal A. fib CHADSVasc score of 5 ( HTN, age, stroke, PVD). Not on anticoagulation due to history of anemia. Cardiogenic recommends to start him on newer oral  anticoagulant after workup completed.  Oozing from rt groin Apply thrombi pads.  Diet: heart healthy,   DVT prophylaxis: sq lovenox ( holding due to oozing from rt groin)  Code Status: Full code Family Communication: Wife at  bedside Disposition Plan:  Carotid  surgery planned for later this week   Consultants:  Neurology  [facility] surgery  Cardiology  IR  Procedures:  Abdomen  CT angiogram of the head and neck  Head CT  Bilateral common carotid and vertebral arteriograms  2-D echo  Antibiotics:  None  HPI/Subjective: Seen and examined. Had issues with bleeding from the right groin angiogram site overnight.  Objective: Filed Vitals:   10/09/15 1041 10/09/15 1410  BP: 181/78 169/82  Pulse: 82 76  Temp: 97.8 F (36.6 C) 98 F (36.7 C)  Resp: 16 20    Intake/Output Summary (Last 24 hours) at 10/09/15 1515 Last data filed at 10/08/15 2257  Gross per 24 hour  Intake    360 ml  Output    600 ml  Net   -240 ml   Filed Weights   10/07/15 0028  Weight: 76.386 kg (168 lb 6.4 oz)    Exam:   General: Elderly male not in distress  HEENT: No pallor, moist mucosa  Chest: Clear bilaterally  CVS: Normal S1 and S2, no murmurs rub or gallop  GI: Soft, nondistended, nontender, bowel sounds present  Musculoskeletal: Warm, no edema, dressing over right groin with some oozing from angiogram site  CNS: Alert and oriented, mild left facial droop, 4+/5 power over her left lower  extremity  Data Reviewed: Basic Metabolic Panel:  Recent Labs Lab 10/06/15 1542 10/06/15 1547  NA 138 142  K 3.5 3.6  CL 102 102  CO2 26  --   GLUCOSE 76 78  BUN 14 18  CREATININE 1.10 1.00  CALCIUM 9.1  --    Liver Function Tests:  Recent Labs Lab 10/06/15 1542  AST 22  ALT 12*  ALKPHOS 57  BILITOT 0.5  PROT 7.6  ALBUMIN 3.5   No results for input(s): LIPASE, AMYLASE in the last 168 hours. No results for input(s): AMMONIA in the last 168 hours. CBC:  Recent Labs Lab 10/06/15 1542 10/06/15 1547 10/08/15 0007  WBC 4.5  --  5.6  NEUTROABS 2.9  --   --   HGB 12.6* 15.6 12.2*  HCT 39.8 46.0 38.4*  MCV 68.4*  --  68.4*  PLT 181  --  178   Cardiac Enzymes: No results for input(s):  CKTOTAL, CKMB, CKMBINDEX, TROPONINI in the last 168 hours. BNP (last 3 results)  Recent Labs  06/19/15 1039  BNP 301.8*    ProBNP (last 3 results) No results for input(s): PROBNP in the last 8760 hours.  CBG:  Recent Labs Lab 10/06/15 1646  GLUCAP 61*    No results found for this or any previous visit (from the past 240 hour(s)).   Studies: Ir Angio Intra Extracran Sel Com Carotid Innominate Bilat Mod Sed  10/08/2015  CLINICAL DATA:  Left-sided weakness, leg greater than arm. Abnormal CT angiogram of the brain and neck. EXAM: BILATERAL COMMON CAROTID AND INNOMINATE ANGIOGRAPHY AND BILATERAL VERTEBRAL ARTERY ANGIOGRAMS PROCEDURE: Contrast:  60 ml 39mL OMNIPAQUE IOHEXOL 300 MG/ML  SOLN Anesthesia/Sedation:  Conscious sedation. Medications: Versed at 1 mg IV.  Fentanyl 25 mcg IV. Following a full explanation of the procedure along with the potential associated complications, an informed witnessed consent was obtained. The right groin was prepped and draped in the usual sterile fashion. Thereafter using modified Seldinger technique, transfemoral access into the right common femoral artery was obtained without difficulty. Over a 0.035 inch guidewire, a 5 French Pinnacle sheath was inserted. Through this, and also over 0.035 inch guidewire, a 5 [place] JB 1 catheter was advanced to the aortic arch region and selectively positioned in the right common carotid artery, the right subclavian artery, the left common carotid artery and the left subclavian artery. There were no acute complications. The patient tolerated the procedure well. FINDINGS: The right common carotid arteriogram demonstrates the right external carotid artery to be mildly narrowed proximal. Its branches are normally opacified otherwise. The right internal carotid artery has severe pre occlusive stenosis on the lateral, likely a string sign with mild poststenotic dilatation. There is mild tortuosity of the junction of the proximal 1/3  and middle 1/3 of the cervical right ICA. More distally, the vessel is seen to opacify normally to the cranial skull base. The petrous and the proximal cavernous segments are widely patent. There is an approximately 50% stenosis of the caval cavernous segment, with approximately 80-85% narrowing of the supraclinoid right ICA. The right middle cerebral artery distribution is seen to opacify into the capillary and the venous phases. Focal areas of caliber narrowing with irregularity represent intracranial arteriosclerotic disease. There is no angiographic visualization of the right anterior cerebral A1 segment. The right vertebral artery origin is patent. The vessel is seen to opacify to the cranial skull base where it opacifies the left posterior-inferior cerebellar artery and the left vertebrobasilar junction. Flow is seen into the distal basilar artery, the posterior cerebral arteries and superior cerebellar arteries. Extreme attenuation of the contrast in the proximal basilar artery is seen. The left vertebral artery origin is normal. The vessel is seen to opacify to the cranial skull base. There is an approximately 50% narrowing of the left vertebrobasilar junction just proximal to the left posterior-inferior cerebellar artery. High-grade 75 plus stenosis seen of the left vertebrobasilar junction just distal to the origin of  the left posterior inferior cerebellar artery. Additionally there is approximately 70% narrowing of the proximal basilar artery. The visualized posterior cerebral arteries, superior cerebellar arteries and the anterior-inferior cerebellar arteries including the left posterior inferior cerebellar artery demonstrated focal areas of caliber narrowing with irregularity most consistent with intracranial arteriosclerosis. The left common carotid arteriogram demonstrates the left external carotid artery and its major branches to be widely patent. The left internal carotid artery at the bulb  demonstrates minimal plaque without associated ulcerations. Mild tortuosity is seen of the junction of the middle and the proximal 1/3 of the left internal carotid artery. More distally the vessel is seen to opacify to the cranial skull base. The petrous segment is widely patent. The cavernous segment demonstrates mild focal areas of caliber irregularity with approximately 50% narrowing of the distal cavernous segment. The supraclinoid segment appears widely patent. The left middle and the left anterior cerebral arteries are seen to opacify into the capillary and the venous phases. Scattered focal areas of caliber irregularity with narrowing suggest intracranial arteriosclerosis. Also seen is simultaneous opacification via the anterior communicating artery of the right anterior cerebral A2 segment and also an attenuated distal right A1 segment. There appears to be contrast leaking into the right middle cerebral artery at the proximal A1 segment. The right A2 segment demonstrates focal areas of caliber irregularity consistent with intracranial arteriosclerosis. 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 Approximately 85% narrowing of the right internal carotid artery supraclinoid segment, and 50% narrowing of the right internal carotid artery cavernous segment. Approximately 75% stenosis of the left vertebrobasilar junction just distal to the origin of the left posterior-inferior cerebellar artery, and approximately 70% narrowing of the proximal basilar artery. Approximately 50% narrowing of the left internal carotid artery cavernous segment. 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 Electronically Signed   By: Luanne Bras M.D.   On: 10/08/2015 11:34   Ir Angio Vertebral Sel Subclavian Innominate Bilat Mod Sed  10/08/2015  CLINICAL DATA:  Left-sided weakness, leg greater than arm. Abnormal CT angiogram of  the brain and neck. EXAM: BILATERAL COMMON CAROTID AND INNOMINATE ANGIOGRAPHY AND BILATERAL VERTEBRAL ARTERY ANGIOGRAMS PROCEDURE: Contrast:  60 ml 75mL OMNIPAQUE IOHEXOL 300 MG/ML  SOLN Anesthesia/Sedation:  Conscious sedation. Medications: Versed at 1 mg IV.  Fentanyl 25 mcg IV. Following a full explanation of the procedure along with the potential associated complications, an informed witnessed consent was obtained. The right groin was prepped and draped in the usual sterile fashion. Thereafter using modified Seldinger technique, transfemoral access into the right common femoral artery was obtained without difficulty. Over a 0.035 inch guidewire, a 5 French Pinnacle sheath was inserted. Through this, and also over 0.035 inch guidewire, a 5 [place] JB 1 catheter was advanced to the aortic arch region and selectively positioned in the right common carotid artery, the right subclavian artery, the left common carotid artery and the left subclavian artery. There were no acute complications. The patient tolerated the procedure well. FINDINGS: The right common carotid arteriogram demonstrates the right external carotid artery to be mildly narrowed proximal. Its branches are normally opacified otherwise. The right internal carotid artery has severe pre occlusive stenosis on the lateral, likely a string sign with mild poststenotic dilatation. There is mild tortuosity of the junction of the proximal 1/3 and middle 1/3 of the cervical right ICA. More distally, the vessel is seen to opacify normally to the cranial skull base. The petrous  and the proximal cavernous segments are widely patent. There is an approximately 50% stenosis of the caval cavernous segment, with approximately 80-85% narrowing of the supraclinoid right ICA. The right middle cerebral artery distribution is seen to opacify into the capillary and the venous phases. Focal areas of caliber narrowing with irregularity represent intracranial arteriosclerotic  disease. There is no angiographic visualization of the right anterior cerebral A1 segment. The right vertebral artery origin is patent. The vessel is seen to opacify to the cranial skull base where it opacifies the left posterior-inferior cerebellar artery and the left vertebrobasilar junction. Flow is seen into the distal basilar artery, the posterior cerebral arteries and superior cerebellar arteries. Extreme attenuation of the contrast in the proximal basilar artery is seen. The left vertebral artery origin is normal. The vessel is seen to opacify to the cranial skull base. There is an approximately 50% narrowing of the left vertebrobasilar junction just proximal to the left posterior-inferior cerebellar artery. High-grade 75 plus stenosis seen of the left vertebrobasilar junction just distal to the origin of the left posterior inferior cerebellar artery. Additionally there is approximately 70% narrowing of the proximal basilar artery. The visualized posterior cerebral arteries, superior cerebellar arteries and the anterior-inferior cerebellar arteries including the left posterior inferior cerebellar artery demonstrated focal areas of caliber narrowing with irregularity most consistent with intracranial arteriosclerosis. The left common carotid arteriogram demonstrates the left external carotid artery and its major branches to be widely patent. The left internal carotid artery at the bulb demonstrates minimal plaque without associated ulcerations. Mild tortuosity is seen of the junction of the middle and the proximal 1/3 of the left internal carotid artery. More distally the vessel is seen to opacify to the cranial skull base. The petrous segment is widely patent. The cavernous segment demonstrates mild focal areas of caliber irregularity with approximately 50% narrowing of the distal cavernous segment. The supraclinoid segment appears widely patent. The left middle and the left anterior cerebral arteries are  seen to opacify into the capillary and the venous phases. Scattered focal areas of caliber irregularity with narrowing suggest intracranial arteriosclerosis. Also seen is simultaneous opacification via the anterior communicating artery of the right anterior cerebral A2 segment and also an attenuated distal right A1 segment. There appears to be contrast leaking into the right middle cerebral artery at the proximal A1 segment. The right A2 segment demonstrates focal areas of caliber irregularity consistent with intracranial arteriosclerosis. 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 Approximately 85% narrowing of the right internal carotid artery supraclinoid segment, and 50% narrowing of the right internal carotid artery cavernous segment. Approximately 75% stenosis of the left vertebrobasilar junction just distal to the origin of the left posterior-inferior cerebellar artery, and approximately 70% narrowing of the proximal basilar artery. Approximately 50% narrowing of the left internal carotid artery cavernous segment. 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 Electronically Signed   By: Luanne Bras M.D.   On: 10/08/2015 11:34    Scheduled Meds: . aspirin  300 mg Rectal Daily   Or  . aspirin  325 mg Oral Daily  . atorvastatin  80 mg Oral Daily  . [START ON 10/10/2015] cefUROXime (ZINACEF)  IV  1.5 g Intravenous To SS-Surg  . enoxaparin (LOVENOX) injection  40 mg Subcutaneous Daily  . ezetimibe  10 mg Oral Daily  . furosemide  20 mg Oral Daily  . ibuprofen      . sodium chloride flush  3 mL Intravenous Q12H  . THROMBI-PAD  1 each Topical Once   Continuous Infusions:     Time spent: [place], [place]  [facility] Pager [phone]. If 7PM-7AM, please contact night-coverage at [email or web address], password [facility] 10/09/2015, 3:15 PM  LOS: 3 days

## 2015-10-09 NOTE — Progress Notes (Signed)
Assessed pt, to find him in l side lying position. Incision at r groin continues to bleed at this time. Observed site with dr Clementeen Graham. Have called pharmacy to expedite hemostatic device

## 2015-10-09 NOTE — Progress Notes (Signed)
   10/09/15 1618  Clinical Encounter Type  Visited With Patient;Other (Comment)  Visit Type Initial  Referral From Nurse;Patient   Chaplain responded to a request to visit with patient. Patient stated that he is fine at this point, but might like to talk to a chaplain after his scan this evening. Chaplain explained our on-call status tonight and that our availability may or may not be limited. If patient is not seen by a chaplain tonight, chaplain will seek to follow-up tomorrow. Chaplain support available as needed.   Jeri Lager, Chaplain 10/09/2015 4:20 PM

## 2015-10-09 NOTE — Progress Notes (Signed)
Right groin contnues to ooze bright red blood. Pt occasionally noncompliant with immobility instructions. pressure held to site for 43min. Thrombi stat dressing in place, after release, smalle amount dried blood on pad, sand bag applied, pedal pulses present equal bil. Family at bedside, instructions reinforced.

## 2015-10-09 NOTE — Progress Notes (Addendum)
OT Cancellation Note  Patient Details Name: [name] MRN: [id number] DOB: [birth year]   Cancelled Treatment:     Reason Eval not completed/Pt not seen: Per progress notes & RN, c/m,  pt on bedrest. Will attempt when medically able.  Carlynn Herald, [name] Beth Dixon, OTR/L 10/09/2015, 7:52 AM

## 2015-10-10 ENCOUNTER — Inpatient Hospital Stay (HOSPITAL_COMMUNITY): Payer: Medicare Other | Admitting: Anesthesiology

## 2015-10-10 ENCOUNTER — Encounter (HOSPITAL_COMMUNITY)
Admission: EM | Disposition: A | Discharge status: Rehabilitation Facility | Payer: Self-pay | Source: Home / Self Care | Attending: Internal Medicine

## 2015-10-10 ENCOUNTER — Encounter (HOSPITAL_COMMUNITY): Payer: Self-pay | Admitting: Radiology

## 2015-10-10 ENCOUNTER — Inpatient Hospital Stay (HOSPITAL_COMMUNITY): Payer: Medicare Other

## 2015-10-10 DIAGNOSIS — I2510 Atherosclerotic heart disease of native coronary artery without angina pectoris: Secondary | ICD-10-CM | POA: Diagnosis not present

## 2015-10-10 HISTORY — PX: ENDARTERECTOMY: SHX5162

## 2015-10-10 LAB — CBC
HCT: 35.9 % — ABNORMAL LOW (ref 39.0–52.0)
Hemoglobin: 11.4 g/dL — ABNORMAL LOW (ref 13.0–17.0)
MCH: 21.6 pg — AB (ref 26.0–34.0)
MCHC: 31.8 g/dL (ref 30.0–36.0)
MCV: 68 fL — ABNORMAL LOW (ref 78.0–100.0)
Platelets: 165 10*3/uL (ref 150–400)
RBC: 5.28 MIL/uL (ref 4.22–5.81)
RDW: 16 % — AB (ref 11.5–15.5)
WBC: 5.8 10*3/uL (ref 4.0–10.5)

## 2015-10-10 LAB — BASIC METABOLIC PANEL
ANION GAP: 10 (ref 5–15)
BUN: 19 mg/dL (ref 6–20)
CALCIUM: 8.7 mg/dL — AB (ref 8.9–10.3)
CO2: 22 mmol/L (ref 22–32)
Chloride: 107 mmol/L (ref 101–111)
Creatinine, Ser: 1.11 mg/dL (ref 0.61–1.24)
GFR calc Af Amer: >60 mL/min (ref >60)
GLUCOSE: 95 mg/dL (ref 65–99)
Potassium: 3.7 mmol/L (ref 3.5–5.1)
SODIUM: 139 mmol/L (ref 135–145)

## 2015-10-10 LAB — ABO/RH: ABO/RH(D): B POS

## 2015-10-10 LAB — SURGICAL PCR SCREEN
MRSA, PCR: NEGATIVE
STAPHYLOCOCCUS AUREUS: NEGATIVE

## 2015-10-10 LAB — TYPE AND SCREEN
ABO/RH(D): B POS
ANTIBODY SCREEN: NEGATIVE

## 2015-10-10 SURGERY — ENDARTERECTOMY, CAROTID
Anesthesia: General | Site: Neck | Laterality: Right

## 2015-10-10 MED ORDER — ONDANSETRON HCL 4 MG/2ML IJ SOLN
4.0000 mg | Freq: Four times a day (QID) | INTRAMUSCULAR | Status: DC | PRN
  Administered 2015-10-12: 4 mg via INTRAVENOUS
  Filled 2015-10-10: qty 2

## 2015-10-10 MED ORDER — ENOXAPARIN SODIUM 40 MG/0.4ML ~~LOC~~ SOLN
40.0000 mg | Freq: Every day | SUBCUTANEOUS | Status: DC
  Filled 2015-10-10: qty 0.4

## 2015-10-10 MED ORDER — FENTANYL CITRATE (PF) 100 MCG/2ML IJ SOLN
INTRAMUSCULAR | Status: DC | PRN
  Administered 2015-10-10: 100 ug via INTRAVENOUS

## 2015-10-10 MED ORDER — SODIUM CHLORIDE 0.9 % IV SOLN
0.0125 ug/kg/min | INTRAVENOUS | Status: AC
  Administered 2015-10-10: .6 ug/kg/min via INTRAVENOUS
  Filled 2015-10-10: qty 2000

## 2015-10-10 MED ORDER — ALUM & MAG HYDROXIDE-SIMETH 200-200-20 MG/5ML PO SUSP: 15.0000 mL | ORAL | Status: DC | PRN

## 2015-10-10 MED ORDER — ROCURONIUM BROMIDE 100 MG/10ML IV SOLN
INTRAVENOUS | Status: DC | PRN
  Administered 2015-10-10: 50 mg via INTRAVENOUS

## 2015-10-10 MED ORDER — GUAIFENESIN-DM 100-10 MG/5ML PO SYRP: 15.0000 mL | ORAL_SOLUTION | ORAL | Status: DC | PRN

## 2015-10-10 MED ORDER — NITROGLYCERIN 0.4 MG SL SUBL
SUBLINGUAL_TABLET | SUBLINGUAL | Status: AC
  Filled 2015-10-10: qty 2

## 2015-10-10 MED ORDER — SODIUM CHLORIDE 0.9 % IV SOLN
0.0125 ug/kg/min | INTRAVENOUS | Status: DC
  Filled 2015-10-10: qty 1000

## 2015-10-10 MED ORDER — EPHEDRINE SULFATE 50 MG/ML IJ SOLN
INTRAMUSCULAR | Status: DC | PRN
  Administered 2015-10-10: 10 mg via INTRAVENOUS

## 2015-10-10 MED ORDER — HYDROMORPHONE HCL 1 MG/ML IJ SOLN
0.2500 mg | INTRAMUSCULAR | Status: DC | PRN
  Administered 2015-10-10 (×2): 0.5 mg via INTRAVENOUS

## 2015-10-10 MED ORDER — PROTAMINE SULFATE 10 MG/ML IV SOLN
INTRAVENOUS | Status: DC | PRN
  Administered 2015-10-10 (×2): 10 mg via INTRAVENOUS
  Administered 2015-10-10: 30 mg via INTRAVENOUS

## 2015-10-10 MED ORDER — PROMETHAZINE HCL 25 MG/ML IJ SOLN: 6.2500 mg | INTRAMUSCULAR | Status: DC | PRN

## 2015-10-10 MED ORDER — OXYCODONE HCL 5 MG PO TABS
5.0000 mg | ORAL_TABLET | Freq: Four times a day (QID) | ORAL | Status: DC | PRN
  Filled 2015-10-10: qty 2

## 2015-10-10 MED ORDER — POTASSIUM CHLORIDE CRYS ER 20 MEQ PO TBCR
20.0000 meq | EXTENDED_RELEASE_TABLET | Freq: Every day | ORAL | Status: AC | PRN
  Administered 2015-10-12: 20 meq via ORAL
  Filled 2015-10-10: qty 1

## 2015-10-10 MED ORDER — HYDRALAZINE HCL 20 MG/ML IJ SOLN
INTRAMUSCULAR | Status: AC
  Administered 2015-10-10: 5 mg via INTRAVENOUS
  Filled 2015-10-10: qty 1

## 2015-10-10 MED ORDER — PROPOFOL 10 MG/ML IV BOLUS
INTRAVENOUS | Status: AC
  Filled 2015-10-10: qty 20

## 2015-10-10 MED ORDER — BISACODYL 10 MG RE SUPP: 10.0000 mg | Freq: Every day | RECTAL | Status: DC | PRN

## 2015-10-10 MED ORDER — PHENYLEPHRINE HCL 10 MG/ML IJ SOLN
10.0000 mg | INTRAVENOUS | Status: DC | PRN
  Administered 2015-10-10: 75 ug/min via INTRAVENOUS

## 2015-10-10 MED ORDER — MEPERIDINE HCL 25 MG/ML IJ SOLN: 6.2500 mg | INTRAMUSCULAR | Status: DC | PRN

## 2015-10-10 MED ORDER — DOCUSATE SODIUM 100 MG PO CAPS
100.0000 mg | ORAL_CAPSULE | Freq: Every day | ORAL | Status: DC
  Administered 2015-10-11 – 2015-10-14 (×4): 100 mg via ORAL
  Filled 2015-10-10 (×4): qty 1

## 2015-10-10 MED ORDER — SODIUM CHLORIDE 0.9 % IV SOLN
INTRAVENOUS | Status: DC | PRN
  Administered 2015-10-10: 500 mL

## 2015-10-10 MED ORDER — METOPROLOL TARTRATE 1 MG/ML IV SOLN
INTRAVENOUS | Status: AC
  Filled 2015-10-10: qty 5

## 2015-10-10 MED ORDER — ACETAMINOPHEN 650 MG RE SUPP
325.0000 mg | RECTAL | Status: DC | PRN
Start: 2015-10-10 — End: 2015-10-14

## 2015-10-10 MED ORDER — METOPROLOL TARTRATE 1 MG/ML IV SOLN: 2.0000 mg | INTRAVENOUS | Status: DC | PRN

## 2015-10-10 MED ORDER — ONDANSETRON HCL 4 MG/2ML IJ SOLN
INTRAMUSCULAR | Status: DC | PRN
  Administered 2015-10-10: 4 mg via INTRAVENOUS

## 2015-10-10 MED ORDER — MORPHINE SULFATE (PF) 2 MG/ML IV SOLN: 2.0000 mg | INTRAVENOUS | Status: DC | PRN

## 2015-10-10 MED ORDER — SODIUM CHLORIDE 0.9 % IV SOLN
INTRAVENOUS | Status: DC
  Administered 2015-10-10: 75 mL/h via INTRAVENOUS

## 2015-10-10 MED ORDER — SODIUM CHLORIDE 0.9 % IV SOLN: 500.0000 mL | Freq: Once | INTRAVENOUS | Status: DC | PRN

## 2015-10-10 MED ORDER — LIDOCAINE HCL (CARDIAC) 20 MG/ML IV SOLN
INTRAVENOUS | Status: DC | PRN
  Administered 2015-10-10: 80 mg via INTRAVENOUS

## 2015-10-10 MED ORDER — ESMOLOL HCL 100 MG/10ML IV SOLN
INTRAVENOUS | Status: AC
  Filled 2015-10-10: qty 10

## 2015-10-10 MED ORDER — LABETALOL HCL 5 MG/ML IV SOLN
INTRAVENOUS | Status: AC
  Administered 2015-10-10: 10 mg via INTRAVENOUS
  Filled 2015-10-10: qty 4

## 2015-10-10 MED ORDER — IOHEXOL 350 MG/ML SOLN
80.0000 mL | Freq: Once | INTRAVENOUS | Status: AC | PRN
  Administered 2015-10-10: 80 mL via INTRAVENOUS

## 2015-10-10 MED ORDER — LIDOCAINE HCL (PF) 1 % IJ SOLN
INTRAMUSCULAR | Status: AC
  Filled 2015-10-10: qty 30

## 2015-10-10 MED ORDER — MIDAZOLAM HCL 2 MG/2ML IJ SOLN
INTRAMUSCULAR | Status: AC
Start: 2015-10-10 — End: 2015-10-10
  Filled 2015-10-10: qty 2

## 2015-10-10 MED ORDER — PANTOPRAZOLE SODIUM 40 MG PO TBEC
40.0000 mg | DELAYED_RELEASE_TABLET | Freq: Every day | ORAL | Status: DC
  Administered 2015-10-10 – 2015-10-14 (×5): 40 mg via ORAL
  Filled 2015-10-10 (×4): qty 1

## 2015-10-10 MED ORDER — HYDROMORPHONE HCL 1 MG/ML IJ SOLN
INTRAMUSCULAR | Status: AC
  Administered 2015-10-10: 0.5 mg via INTRAVENOUS
  Filled 2015-10-10: qty 1

## 2015-10-10 MED ORDER — NITROGLYCERIN 0.4 MG SL SUBL
0.8000 mg | SUBLINGUAL_TABLET | Freq: Once | SUBLINGUAL | Status: AC
  Administered 2015-10-10: 0.8 mg via SUBLINGUAL

## 2015-10-10 MED ORDER — METOPROLOL TARTRATE 1 MG/ML IV SOLN
5.0000 mg | Freq: Once | INTRAVENOUS | Status: AC
  Administered 2015-10-10: 5 mg via INTRAVENOUS

## 2015-10-10 MED ORDER — MAGNESIUM HYDROXIDE 400 MG/5ML PO SUSP: 30.0000 mL | Freq: Every day | ORAL | Status: DC | PRN

## 2015-10-10 MED ORDER — REMIFENTANIL BOLUS VIA INFUSION OPTIME: INTRAVENOUS | Status: DC | PRN

## 2015-10-10 MED ORDER — LABETALOL HCL 5 MG/ML IV SOLN
10.0000 mg | INTRAVENOUS | Status: DC | PRN
  Administered 2015-10-10 (×3): 10 mg via INTRAVENOUS

## 2015-10-10 MED ORDER — HEPARIN SODIUM (PORCINE) 1000 UNIT/ML IJ SOLN
INTRAMUSCULAR | Status: AC
  Filled 2015-10-10: qty 1

## 2015-10-10 MED ORDER — LACTATED RINGERS IV SOLN
INTRAVENOUS | Status: DC
  Administered 2015-10-10 (×2): via INTRAVENOUS

## 2015-10-10 MED ORDER — PHENOL 1.4 % MT LIQD
1.0000 | OROMUCOSAL | Status: DC | PRN
Start: 2015-10-10 — End: 2015-10-14

## 2015-10-10 MED ORDER — LABETALOL HCL 5 MG/ML IV SOLN
10.0000 mg | INTRAVENOUS | Status: AC | PRN
  Administered 2015-10-10 (×2): 10 mg via INTRAVENOUS

## 2015-10-10 MED ORDER — ESMOLOL HCL 100 MG/10ML IV SOLN
INTRAVENOUS | Status: DC | PRN
  Administered 2015-10-10: 20 mg via INTRAVENOUS
  Administered 2015-10-10: 50 mg via INTRAVENOUS
  Administered 2015-10-10: 30 mg via INTRAVENOUS

## 2015-10-10 MED ORDER — PROTAMINE SULFATE 10 MG/ML IV SOLN
INTRAVENOUS | Status: AC
  Filled 2015-10-10: qty 5

## 2015-10-10 MED ORDER — LABETALOL HCL 5 MG/ML IV SOLN
INTRAVENOUS | Status: AC
  Filled 2015-10-10: qty 4

## 2015-10-10 MED ORDER — NEOSTIGMINE METHYLSULFATE 10 MG/10ML IV SOLN
INTRAVENOUS | Status: DC | PRN
  Administered 2015-10-10: 3 mg via INTRAVENOUS

## 2015-10-10 MED ORDER — PROPOFOL 10 MG/ML IV BOLUS
INTRAVENOUS | Status: DC | PRN
  Administered 2015-10-10: 40 mg via INTRAVENOUS
  Administered 2015-10-10: 130 mg via INTRAVENOUS

## 2015-10-10 MED ORDER — HYDRALAZINE HCL 20 MG/ML IJ SOLN
5.0000 mg | INTRAMUSCULAR | Status: AC | PRN
  Administered 2015-10-10 (×2): 5 mg via INTRAVENOUS

## 2015-10-10 MED ORDER — FENTANYL CITRATE (PF) 250 MCG/5ML IJ SOLN
INTRAMUSCULAR | Status: AC
  Filled 2015-10-10: qty 5

## 2015-10-10 MED ORDER — NITROGLYCERIN IN D5W 200-5 MCG/ML-% IV SOLN: 0.0000 ug/min | INTRAVENOUS | Status: DC

## 2015-10-10 MED ORDER — LABETALOL HCL 5 MG/ML IV SOLN: 10.0000 mg | INTRAVENOUS | Status: DC | PRN

## 2015-10-10 MED ORDER — HEMOSTATIC AGENTS (NO CHARGE) OPTIME
TOPICAL | Status: DC | PRN
  Administered 2015-10-10: 1 via TOPICAL

## 2015-10-10 MED ORDER — GLYCOPYRROLATE 0.2 MG/ML IJ SOLN
INTRAMUSCULAR | Status: DC | PRN
  Administered 2015-10-10: 0.4 mg via INTRAVENOUS
  Administered 2015-10-10: 0.2 mg via INTRAVENOUS

## 2015-10-10 MED ORDER — 0.9 % SODIUM CHLORIDE (POUR BTL) OPTIME
TOPICAL | Status: DC | PRN
  Administered 2015-10-10: 2000 mL

## 2015-10-10 MED ORDER — ACETAMINOPHEN 325 MG PO TABS: 325.0000 mg | ORAL_TABLET | ORAL | Status: DC | PRN

## 2015-10-10 MED ORDER — HEPARIN SODIUM (PORCINE) 1000 UNIT/ML IJ SOLN
INTRAMUSCULAR | Status: DC | PRN
  Administered 2015-10-10: 2000 [IU] via INTRAVENOUS
  Administered 2015-10-10: 8000 [IU] via INTRAVENOUS

## 2015-10-10 MED ORDER — CEFUROXIME SODIUM 1.5 G IJ SOLR
1.5000 g | Freq: Two times a day (BID) | INTRAMUSCULAR | Status: AC
  Administered 2015-10-11 (×2): 1.5 g via INTRAVENOUS
  Filled 2015-10-10 (×2): qty 1.5

## 2015-10-10 SURGICAL SUPPLY — 55 items
CANISTER SUCTION 2500CC (MISCELLANEOUS) ×3 IMPLANT
CATH ROBINSON RED A/P 18FR (CATHETERS) ×3 IMPLANT
CATH SUCT 10FR WHISTLE TIP (CATHETERS) ×5 IMPLANT
CLIP TI MEDIUM 6 (CLIP) ×5 IMPLANT
CLIP TI WIDE RED SMALL 6 (CLIP) ×5 IMPLANT
CRADLE DONUT ADULT HEAD (MISCELLANEOUS) ×3 IMPLANT
DRAIN CHANNEL 15F RND FF W/TCR (WOUND CARE) IMPLANT
ELECT REM PT RETURN 9FT ADLT (ELECTROSURGICAL) ×3
ELECTRODE REM PT RTRN 9FT ADLT (ELECTROSURGICAL) ×1 IMPLANT
EVACUATOR SILICONE 100CC (DRAIN) IMPLANT
GAUZE SPONGE 4X4 12PLY STRL (GAUZE/BANDAGES/DRESSINGS) ×3 IMPLANT
GLOVE BIO SURGEON STRL SZ 6.5 (GLOVE) ×4 IMPLANT
GLOVE BIO SURGEON STRL SZ7.5 (GLOVE) ×2 IMPLANT
GLOVE BIO SURGEONS STRL SZ 6.5 (GLOVE) ×2
GLOVE BIOGEL PI IND STRL 7.0 (GLOVE) IMPLANT
GLOVE BIOGEL PI IND STRL 7.5 (GLOVE) ×1 IMPLANT
GLOVE BIOGEL PI IND STRL 8 (GLOVE) IMPLANT
GLOVE BIOGEL PI INDICATOR 7.0 (GLOVE) ×2
GLOVE BIOGEL PI INDICATOR 7.5 (GLOVE) ×2
GLOVE BIOGEL PI INDICATOR 8 (GLOVE) ×2
GLOVE SURG SS PI 6.5 STRL IVOR (GLOVE) ×2 IMPLANT
GLOVE SURG SS PI 7.5 STRL IVOR (GLOVE) ×3 IMPLANT
GOWN STRL REUS W/ TWL LRG LVL3 (GOWN DISPOSABLE) ×2 IMPLANT
GOWN STRL REUS W/ TWL XL LVL3 (GOWN DISPOSABLE) ×1 IMPLANT
GOWN STRL REUS W/TWL LRG LVL3 (GOWN DISPOSABLE) ×9
GOWN STRL REUS W/TWL XL LVL3 (GOWN DISPOSABLE) ×3
HEMOSTAT SNOW SURGICEL 2X4 (HEMOSTASIS) IMPLANT
INSERT FOGARTY SM (MISCELLANEOUS) IMPLANT
KIT BASIN OR (CUSTOM PROCEDURE TRAY) ×3 IMPLANT
KIT ROOM TURNOVER OR (KITS) ×3 IMPLANT
LIQUID BAND (GAUZE/BANDAGES/DRESSINGS) ×3 IMPLANT
NDL HYPO 25GX1X1/2 BEV (NEEDLE) IMPLANT
NEEDLE HYPO 25GX1X1/2 BEV (NEEDLE) IMPLANT
NS IRRIG 1000ML POUR BTL (IV SOLUTION) ×9 IMPLANT
PACK CAROTID (CUSTOM PROCEDURE TRAY) ×3 IMPLANT
PAD ARMBOARD 7.5X6 YLW CONV (MISCELLANEOUS) ×6 IMPLANT
PATCH VASC XENOSURE 1CMX6CM (Vascular Products) ×3 IMPLANT
PATCH VASC XENOSURE 1X6 (Vascular Products) IMPLANT
SHUNT CAROTID BYPASS 10 (VASCULAR PRODUCTS) IMPLANT
SHUNT CAROTID BYPASS 12FRX15.5 (VASCULAR PRODUCTS) IMPLANT
SPONGE INTESTINAL PEANUT (DISPOSABLE) ×3 IMPLANT
SUT ETHILON 3 0 PS 1 (SUTURE) IMPLANT
SUT PROLENE 5 0 C 1 24 (SUTURE) ×2 IMPLANT
SUT PROLENE 6 0 BV (SUTURE) ×5 IMPLANT
SUT PROLENE 7 0 BV 1 (SUTURE) IMPLANT
SUT PROLENE 7 0 BV1 MDA (SUTURE) ×2 IMPLANT
SUT SILK 2 0 (SUTURE) ×3
SUT SILK 2-0 18XBRD TIE 12 (SUTURE) ×1 IMPLANT
SUT SILK 3 0 TIES 17X18 (SUTURE)
SUT SILK 3-0 18XBRD TIE BLK (SUTURE) IMPLANT
SUT VIC AB 3-0 SH 27 (SUTURE) ×6
SUT VIC AB 3-0 SH 27X BRD (SUTURE) ×2 IMPLANT
SUT VICRYL 4-0 PS2 18IN ABS (SUTURE) ×3 IMPLANT
SYR CONTROL 10ML LL (SYRINGE) IMPLANT
WATER STERILE IRR 1000ML POUR (IV SOLUTION) ×3 IMPLANT

## 2015-10-10 NOTE — Anesthesia Procedure Notes (Signed)
Procedure Name: Intubation Date/Time: 10/10/2015 1:21 PM Performed by: Mariea Clonts Pre-anesthesia Checklist: Emergency Drugs available, Timeout performed, Suction available, Patient identified and Patient being monitored Patient Re-evaluated:Patient Re-evaluated prior to inductionOxygen Delivery Method: Circle system utilized Preoxygenation: Pre-oxygenation with 100% oxygen Intubation Type: IV induction Ventilation: Oral airway inserted - appropriate to patient size and Mask ventilation without difficulty Laryngoscope Size: Miller and 2 Grade View: Grade I Tube type: Oral Tube size: 7.5 mm Number of attempts: 1 Placement Confirmation: ETT inserted through vocal cords under direct vision,  breath sounds checked- equal and bilateral and positive ETCO2 Tube secured with: Tape Dental Injury: Teeth and Oropharynx as per pre-operative assessment

## 2015-10-10 NOTE — Op Note (Signed)
Patient name: [name] MRN: [id number] DOB: 1940/10/17 Sex: male  10/06/2015 - 10/10/2015 Pre-operative Diagnosis: Symptomatic   right carotid stenosis Post-operative diagnosis:  Same Surgeon:  Annamarie Major Assistants:  Gae Gallop, Leontine Locket Procedure:    right carotid Endarterectomy with bovine pericardial  patch angioplasty Anesthesia:  General Blood Loss:  See anesthesia record Specimens:  Carotid Plaque to pathology  Findings:  90 %stenosis; Thrombus:  none  Indications:  Well a CT scan and found to have a high-grade right carotid lesion.  He also has intracranial stenosis.  He has improved significantly from his neurologic deficits which consisted of left leg numbness and weakness.  The risks and benefits were discussed with the patient preoperatively.  Procedure:  The patient was identified in the holding area and taken to [place] [facility]  The patient was then placed supine on the table.   General endotrachial anesthesia was administered.  The patient was prepped and draped in the usual sterile fashion.  A time out was called and antibiotics were administered.  The incision was made along the anterior border of the right sternocleidomastoid muscle.  Cautery was used to dissect through the subcutaneous tissue.  The platysma muscle was divided with cautery.  The internal jugular vein was exposed along its anterior medial border.  The common facial vein was exposed and then divided between 2-0 silk ties and metal clips.  The common carotid artery was then circumferentially exposed and encircled with an umbilical tape.  The vagus nerve was identified and protected.  Next sharp dissection was used to expose the external carotid artery and the superior thyroid artery.  The were encircled with a blue vessel loop and a 2-0 silk tie respectively.  Finally, the internal carotid was carefully dissected free.  An umbilical tape was placed around the internal carotid artery distal to the  diseased segment.  The hypoglossal nerve was visualized throughout and protected.  The patient was given systemic heparinization.  A bovine carotid patch was selected and prepared on the back table.  A 10 french shunt was also prepared.  After blood pressure readings were appropriate and the heparin had been given time to circulate, the internal carotid artery was occluded with a baby Gregory clamp.  The external and common carotid arteries were then occluded with vascular clamps and the 2-0 tie tightened on the superior thyroid artery.  A #11 blade was used to make an arteriotomy in the common carotid artery.  This was extended with Potts scissors along the anterior and lateral border of the common and internal carotid artery.  Approximately 90% stenosis was identified.  There was no thrombus identified.  The 10 french shunt was then placed.  A kleiner kuntz elevator was used to perform endarterectomy.  An eversion endarterectomy was performed in the external carotid artery.  A good distal endpoint was obtained in the internal carotid artery.  The specimen was removed and sent to pathology.  Heparinized saline was used to irrigate the endarterectomized field.  All potential embolic debris was removed.  Bovine pericardial patch angioplasty was then performed using a running 6-0 Prolene. Just prior to completion of the repair, the shunt was removed. The common internal and external carotid arteries were all appropriately flushed. The artery was again irrigated with heparin saline.  The anastomosis was then secured. The clamp was first released on the external carotid artery followed by the common carotid artery approximately 30 seconds later, bloodflow was reestablish through the internal  carotid artery.  Next, a hand-held  Doppler was used to evaluate the signals in the common, external, and internal  carotid arteries, all of which had appropriate signals. I then administered  50 mg protamine. The wound was then  irrigated.  After hemostasis was achieved, the carotid sheath was reapproximated with 3-0 Vicryl. The  platysma muscle was reapproximated with running 3-0 Vicryl. The skin  was closed with 4-0 Vicryl. Dermabond was placed on the skin. The  patient was then successfully extubated. His neurologic exam was  similar to his preprocedural exam. The patient was then taken to recovery room  in stable condition. There were no complications.     Disposition:  To PACU in stable condition.  Relevant Operative Details:  High bifurcation and a lesion, requiring mobilization of the hypoglossal nerve.  This was an ulcerated plaque with liquefication in its wall.  Multiple loose fragments and debris were associated with this plaque.  It extended down to the clavicle.  The common carotid artery was closed primarily and the internal and distal common carotid artery were closed with a bovine pericardial patch.  Theotis Burrow, M.D. [facility] [place] Office: [phone] Pager:  [phone]

## 2015-10-10 NOTE — Progress Notes (Signed)
STROKE TEAM PROGRESS NOTE   SUBJECTIVE (INTERVAL HISTORY) Dr. Leonie Man discussed diagnosis, prognosis,  treatment options and plan of care with patient.i discussed case with Dr Trula Slade who felt he could do Rt CEA in next few days after getting cardiology clerance    OBJECTIVE Temp:  [97.2 F (36.2 C)-98.4 F (36.9 C)] 97.2 F (36.2 C) (03/09 1127) Pulse Rate:  [66-93] 66 (03/09 1127) Cardiac Rhythm:  [-] Normal sinus rhythm (03/09 0915) Resp:  [18-20] 18 (03/09 1127) BP: (169-194)/(78-99) 194/96 mmHg (03/09 1127) SpO2:  [99 %-100 %] 100 % (03/09 1127)  CBC:   Recent Labs Lab 10/06/15 1542  10/08/15 0007 10/10/15 0552  WBC 4.5  --  5.6 5.8  NEUTROABS 2.9  --   --   --   HGB 12.6*  < > 12.2* 11.4*  HCT 39.8  < > 38.4* 35.9*  MCV 68.4*  --  68.4* 68.0*  PLT 181  --  178 165  < > = values in this interval not displayed.  Basic Metabolic Panel:   Recent Labs Lab 10/06/15 1542 10/06/15 1547 10/10/15 0552  NA 138 142 139  K 3.5 3.6 3.7  CL 102 102 107  CO2 26  --  22  GLUCOSE 76 78 95  BUN 14 18 19   CREATININE 1.10 1.00 1.11  CALCIUM 9.1  --  8.7*    Lipid Panel:     Component Value Date/Time   CHOL 262* 10/07/2015 1114   TRIG 118 10/07/2015 1114   HDL 32* 10/07/2015 1114   CHOLHDL 8.2 10/07/2015 1114   VLDL 24 10/07/2015 1114   LDLCALC 206* 10/07/2015 1114   HgbA1c:  Lab Results  Component Value Date   HGBA1C 6.2* 10/07/2015   Urine Drug Screen:     Component Value Date/Time   LABOPIA NONE DETECTED 10/09/2015 1550   LABOPIA NEGATIVE 01/01/2009 0600   COCAINSCRNUR NONE DETECTED 10/09/2015 1550   COCAINSCRNUR NEGATIVE 01/01/2009 0600   LABBENZ POSITIVE* 10/09/2015 1550   LABBENZ NEGATIVE 01/01/2009 0600   AMPHETMU NONE DETECTED 10/09/2015 1550   AMPHETMU NEGATIVE 01/01/2009 0600   THCU NONE DETECTED 10/09/2015 1550   LABBARB NONE DETECTED 10/09/2015 1550      IMAGING  Ct Head Wo Contrast 10/06/2015  1.  No acute intracranial abnormality. 2.   Cerebral atrophy and small vessel ischemic change. 3. Sinus disease.    Ct Angio Head & Neck W/cm &/or Wo/cm 10/07/2015  1. Radiographic String Sign Stenosis at the right ICA origin related to Low-density plaque or thrombus, however, there is superimposed severe intracranial atherosclerosis including high-grade distal right ICA stenosis in part due to bulky calcified plaque. 2. Widespread intracranial moderate to severe first and second order vessel irregularity and stenosis, including severe stenoses at the: -bilateral distal vertebral arteries, - proximal basilar artery, - bilateral PCAs, - right ACA A1, - bilateral ACA distal A2 segments. 3. No intracranial hemorrhage or mass effect.   Mr Brain Wo Contrast 10/06/2015  1. Patchy acute ischemic nonhemorrhagic infarcts involving the cortical gray matter and deep white matter of the right frontal, right parietal, and right occipital lobes. No associated mass effect. 2. Subtle abnormal flow void within the distal right ICA, which may reflect underlying atheromatous disease, slow flow, and/or partial occlusion. 3. Generalized cerebral atrophy with moderate chronic small vessel ischemic disease. 4. Mild paranasal sinus disease as above.   2D Echocardiogram  - Left ventricle: The cavity size was normal. Wall thickness wasincreased in a pattern of severe LVH.  Systolic function wasnormal. The estimated ejection fraction was in the range of 55%to 60%. Left ventricular diastolic function parameters werenormal. - Aortic valve: There was mild regurgitation. - Mitral valve: There was mild regurgitation. - Left atrium: The atrium was moderately dilated.  Cerebral Angiogram 10/08/2015 1.Preocclusive stenosis of RT ICA prox . 2.Approx 80 % stenosis of RT ICA supraclinoid seg and 50 % of cavernous seg. 3.Occluded RT ACA A1 seg. 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 5.difuse mod to mod severe ICAD   PHYSICAL EXAM Pleasant  elderly Caucasian male currently not in distress. . Afebrile. Head is nontraumatic. Neck is supple without bruit.    Cardiac exam no murmur or gallop. Lungs are clear to auscultation. Distal pulses are well felt. Neurological Exam :  Awake alert oriented x 3 normal speech and language. Mild left lower face asymmetry. Tongue midline. No drift. Mild diminished fine finger movements on left. Orbits right over left upper extremity. Mild left grip weak.. Normal sensation . Normal coordination.   ASSESSMENT/PLAN [name] is a 75 y.o. male with history of hypertension, stroke, myocardial infarction, atrial fibrillation presenting with left leg weakness. He did not receive IV t-PA due to delay in arrival.   Stroke:  patchy right frontal, right parietal and right occipital infarcts, likely secondary to large vessel disease source  MRI  patchy right frontal, right parietal and right occipital infarcts  CTA angio head and neck right ICA origin string sign with superimposed severe intracranial atherosclerosis including high-grade right ICA stenosis due to bulky calcified plaque.  2D Echo  No source of embolus   Cerebral angiogram R supraclinoid ICA 90%, cavernous 50%. Occluded R A1. 70% L VBJ/prox BA  LDL 206  HgbA1c 6.2  Lovenox 40 mg sq daily for VTE prophylaxis Diet NPO time specified Except for: Sips with Meds  aspirin 81 mg daily prior to admission, now on aspirin 325 mg daily  Patient counseled to be compliant with his antithrombotic medications  Ongoing aggressive stroke risk factor management  Therapy recommendations:  pending   Disposition:  pending   Intra and Extracranial stenoses  Cerebral angiogram R supraclinoid ICA 90%, cavernous 50%. Occluded R A1. 70% L VBJ/prox BA  Dr. Leonie Man has contacted [facility]  Consideration of surgery/stent (Dr. Trula Slade)  Atrial Fibrillation  Home anticoagulation:  aspirin 81 mg daily   In note from 2013 stated not on anticoagulation d/t  anemia, colonscopy with internal hemorrhoids  11/2011 - subacute stroke found on evaluation of dizziness - pt with afib. Coumadin recommended Seen by neurology (reynolds)   Hypertension  Elevated, but acceptable Permissive hypertension (OK if < 220/120) but gradually normalize in 5-7 days  Hyperlipidemia  Home meds:  Lipitor 80, resumed in hospital  LDL 206, goal < 70  Continue statin at discharge  Other Stroke Risk Factors  Advanced age  Hx stroke/TIA  11/2011 - subacute stroke found on evaluation of dizziness. Coumadin recommended Seen by neurology (reynolds)  Coronary artery disease  Other Active Problems  Chronic kidney disease stage III  BPH  Hospital day # [place] for Pager information 10/10/2015 12:44 PM  I have personally examined this patient, reviewed notes, independently viewed imaging studies, participated in medical decision making and plan of care. I have made any additions or clarifications directly to the above note. Agree with note above. The patient catheter angiogram shows a radiographic string sign in the proximal  right ICA as well as 80% right cavernous carotid stenosis as well with additional multivessel stenosis intracranially. This represents a high risk situation with patient being at significant risk for carotid occlusion, strokes, neurological worsening. He may possibly benefit with carotid revascularization.Dr Trula Slade agreeable   For urgent right carotid revascularization after cardiology clearanceThis will have to be done soon   prior to discharge him home.   Antony Contras, MD Medical Director [facility] Pager: [phone] 10/10/2015 12:44 PM   To contact Stroke Continuity provider, please refer to [email or web address] After hours, contact General Neurology

## 2015-10-10 NOTE — Progress Notes (Signed)
Rec'd report from [facility], assuming care for patient at this time

## 2015-10-10 NOTE — Progress Notes (Signed)
PT Cancellation Note  Patient Details Name: [name] MRN: [id number] DOB: Dec 20, 1940   Cancelled Treatment:    Reason Eval/Treat Not Completed: Patient at procedure or test/unavailable. Patient undergoing CEA. Will follow-up 3/10 if medically appropriate.   Jamelle Noy 10/10/2015, 12:06 PM Pager [phone]

## 2015-10-10 NOTE — Progress Notes (Signed)
[facility] PROGRESS NOTE  [name] [id number] DOB: [birth year] DOA: 10/06/2015 PCP: Cathlean Cower, MD  Brief narrative On 75 year old male with history of hypertension, prior MI, prior CVA and A. fib not on anticoagulation due to history of anemia admitted with one-day history of left leg weakness and numbness. On presentation patient was hypertensive to 220/100 mmHg. Head CT was unremarkable for acute abnormality. MRI of the brain showed acute ischemic infarct of the right frontal, parietal and occipital lobes. Admitted to hospitalist service for stroke workup and management. Neurology following.  Assessment/Plan: Acute ischemic stroke MRI brain shows patchy right frontal, parietal and occipital infarcts. CT angiogram of the head and neck showed high-grade right ICA stenosis with bulky calcified plaque. 2-D echo was unremarkable without any source of embolus. A cerebral angiogram was done showed 90% right supraclinoid stenosis, 50% cavernous stenosis and 70% stenosis at the left vertebrobasilar junction. LDL of 206 and A1c of 6.2. Switched to full dose aspirin daily. Continue Lipitor and Zetia. Allow permissive hypertension. Vascular surgery consulted and recommends carotid endarterectomy -Cardiology consulted for preoperative clearance. Patient's EKG has new T-wave inversion inferior leads.given his significant CAD recommend coronary CT angiogram which shows diffuse moderate plaque in LAD and LCx. Will await further cardiology recommendations (Lexiscan versus proceed with carotid surgery) -PT/OT evaluation  Coronary artery disease Continue aspirin, statin  paroxysmal A. fib CHADSVasc score of 5 ( HTN, age, stroke, PVD). Not on anticoagulation due to history of anemia. Cardiogenic recommends to start him on newer oral  anticoagulant after workup completed.  Oozing from rt groin Improved after thrombin pads applied.  Diet: Nothing by mouth for carotid endarterectomy  DVT  prophylaxis: sq lovenox   Code Status: Full code Family Communication: Wife at bedside Disposition Plan:  Pending Carotid  surgery . Pending PT eval   Consultants:  Neurology  [facility] surgery  Cardiology  IR  Procedures:  Abdomen  CT angiogram of the head and neck  Head CT  Bilateral common carotid and vertebral arteriograms  2-D echo  Coronary CT  Antibiotics:  None  HPI/Subjective: Seen and examined. Denies any symptoms. Oozing from right groin has stopped.  Objective: Filed Vitals:   10/10/15 0915 10/10/15 1127  BP: 185/92 194/96  Pulse: 73 66  Temp:  97.2 F (36.2 C)  Resp:  18    Intake/Output Summary (Last 24 hours) at 10/10/15 1349 Last data filed at 10/10/15 1338  Gross per 24 hour  Intake    880 ml  Output    875 ml  Net      5 ml   Filed Weights   10/07/15 0028  Weight: 76.386 kg (168 lb 6.4 oz)    Exam:   General:  not in distress  HEENT:  moist mucosa  Chest: Clear bilaterally  CVS: Normal S1 and S2, no murmurs rub or gallop  GI: Soft, nondistended, nontender,  Musculoskeletal: Warm, no edema, No further bleeding from right angiogram site  CNS: Alert and oriented, mild left facial droop, 4+/5 power over her left lower  extremity  Data Reviewed: Basic Metabolic Panel:  Recent Labs Lab 10/06/15 1542 10/06/15 1547 10/10/15 0552  NA 138 142 139  K 3.5 3.6 3.7  CL 102 102 107  CO2 26  --  22  GLUCOSE 76 78 95  BUN 14 18 19   CREATININE 1.10 1.00 1.11  CALCIUM 9.1  --  8.7*   Liver Function Tests:  Recent Labs Lab 10/06/15 1542  AST 22  ALT  12*  ALKPHOS 57  BILITOT 0.5  PROT 7.6  ALBUMIN 3.5   No results for input(s): LIPASE, AMYLASE in the last 168 hours. No results for input(s): AMMONIA in the last 168 hours. CBC:  Recent Labs Lab 10/06/15 1542 10/06/15 1547 10/08/15 0007 10/10/15 0552  WBC 4.5  --  5.6 5.8  NEUTROABS 2.9  --   --   --   HGB 12.6* 15.6 12.2* 11.4*  HCT 39.8 46.0 38.4* 35.9*   MCV 68.4*  --  68.4* 68.0*  PLT 181  --  178 165   Cardiac Enzymes: No results for input(s): CKTOTAL, CKMB, CKMBINDEX, TROPONINI in the last 168 hours. BNP (last 3 results)  Recent Labs  06/19/15 1039  BNP 301.8*    ProBNP (last 3 results) No results for input(s): PROBNP in the last 8760 hours.  CBG:  Recent Labs Lab 10/06/15 1646  GLUCAP 61*    Recent Results (from the past 240 hour(s))  Surgical pcr screen     Status: None   Collection Time: 10/10/15  6:32 AM  Result Value Ref Range Status   MRSA, PCR NEGATIVE NEGATIVE Final   Staphylococcus aureus NEGATIVE NEGATIVE Final    Comment:        The Xpert SA Assay (FDA approved for NASAL specimens in patients over 51 years of age), is one component of a comprehensive surveillance program.  Test performance has been validated by [facility] for patients greater than or equal to 81 year old. It is not intended to diagnose infection nor to guide or monitor treatment.      Studies: Ct Coronary Morp W/cta Cor W/score W/ca W/cm &/or Wo/cm  10/10/2015  ADDENDUM REPORT: 10/10/2015 10:55 CLINICAL DATA:  Chest pain EXAM: Cardiac/Coronary  CT TECHNIQUE: The patient was scanned on a Philips 256 scanner. FINDINGS: A 120 kV prospective scan was triggered in the descending thoracic aorta at 111 HU's. Axial non-contrast 3 mm slices were carried out through the heart. The data set was analyzed on a dedicated work station and scored using the [place]. Gantry rotation speed was 270 msecs and collimation was .9 mm. 5 mg of iv Metoprolol and 0.8 mg of sl NTG was given. The 3D data set was reconstructed in 5% intervals of the 67-82 % of the R-R cycle. Diastolic phases were analyzed on a dedicated work station using MPR, MIP and VRT modes. The patient received 80 cc of contrast. Aorta:  Normal size, minimal calcifications, no dissection. Aortic Valve:  Trileaflet, minimal calcifications. Coronary Arteries: Normal origin.  Left dominance.  Left main is a large artery that has mild noncalcified plaque in the proximal segment and mild calcified plaque in the distal portion with associated stenosis 25-50%. LAD is a large artery that has moderate diffuse mixed, predominantly calcified plaque in the proximal and mid segments with associated stenosis 50-69%. Distal LAD has only mild plaque. The first diagonal branch has only minimal plaque, the second diagonal branch has mild calcified ostial plaque. LCX artery has diffuse moderate predominantly calcified plaque in its mid portion with associated stenosis 50-69%. There is a focal spot at the distal LCX artery suspicious of stenosis close to 70%. PLA and PDA have minimal plaque. RCA is a very small non-dominant artery with no obvious plaque. IMPRESSION: 1. Coronary calcium score of 980. This was 46 percentile for age and sex matched control. 2. Normal coronary origin with left dominance. 3. Diffuse moderate plaque in the LAD and LCX arteries. An aggressive medical  therapy is recommended. Ena Dawley Electronically Signed   By: Ena Dawley   On: 10/10/2015 10:55  10/10/2015  EXAM: OVER-READ INTERPRETATION  CT CHEST The following report is an over-read performed by radiologist Dr. Rebekah Chesterfield [facility] on 10/10/2015. This over-read does not include interpretation of cardiac or coronary anatomy or pathology. The coronary calcium score/coronary CTA interpretation by the cardiologist is attached. COMPARISON:  No priors. FINDINGS: In the medial aspect of the left lower lobe there is a 12 x 15 mm area of nodular architectural distortion (image 56 of series 304). This is somewhat similar in retrospect to remote prior study 06/09/2007, although slightly more bulky. 6 mm left upper lobe pulmonary nodule (image 29 of series 304). 3 mm left upper lobe nodule (image 39 of series 304). 11 x 7 mm nodule in the inferior aspect of the right upper lobe abutting the minor fissure (image 24 of series  304). No acute consolidative airspace disease. Trace bilateral pleural effusions lying dependently. Areas of ground-glass attenuation and mild interlobular septal thickening are noted throughout the lungs bilaterally, suggesting a background of mild interstitial pulmonary edema. No definite lymphadenopathy within the visualized thorax. Visualized portions of the upper abdomen are remarkable for atherosclerosis. There are no aggressive appearing lytic or blastic lesions noted in the visualized portions of the skeleton. IMPRESSION: 1. Multiple pulmonary nodules scattered throughout the lungs bilaterally. Although the largest of these has been present on prior study dating back to 2008, it does appear slightly more bulky. This largest nodule is favored to represent an area of scarring, however, there are several other nodules that warrant attention on followup studies, and further evaluation with PET-CT is suggested at this time. 2. Evidence of mild interstitial pulmonary edema. 3. Trace bilateral pleural effusions bilaterally. These results will be called to the ordering clinician or representative by the Radiologist Assistant, and communication documented in the PACS or zVision Dashboard. Electronically Signed: By: Vinnie Langton M.D. On: 10/10/2015 09:57    Scheduled Meds: . [MAR Hold] aspirin  300 mg Rectal Daily   Or  . [MAR Hold] aspirin  325 mg Oral Daily  . [MAR Hold] atorvastatin  80 mg Oral Daily  . [MAR Hold] enoxaparin (LOVENOX) injection  40 mg Subcutaneous Daily  . [MAR Hold] ezetimibe  10 mg Oral Daily  . [MAR Hold] furosemide  20 mg Oral Daily  . metoprolol      . nitroGLYCERIN      . [MAR Hold] sodium chloride flush  3 mL Intravenous Q12H  . [MAR Hold] THROMBI-PAD  1 each Topical Once   Continuous Infusions: . lactated ringers 10 mL/hr at 10/10/15 1203      Time spent: [place], [place]  [facility] Pager [phone]. If 7PM-7AM, please contact  night-coverage at [email or web address], password [facility] 10/10/2015, 1:49 PM  LOS: 4 days

## 2015-10-10 NOTE — Progress Notes (Signed)
SLP Cancellation Note  Patient Details Name: [name] MRN: [id number] DOB: 30-Jul-1941   Cancelled treatment:        patient in OR. Will f/u 3/10.   [place], CCC-SLP [phone]                                                                                                 [name] [name] 10/10/2015, 3:18 PM

## 2015-10-10 NOTE — Progress Notes (Signed)
OT Cancellation Note  Patient Details Name: [name] MRN: [id number] DOB: [birth year]   Cancelled Treatment:    Reason Eval/Treat Not Completed: Patient at procedure or test/ unavailable. Will follow up for OT eval as time allows and pt is appropriate.   Binnie Kand M.S., OTR/L Pager: [phone]  10/10/2015, 9:35 AM

## 2015-10-10 NOTE — Progress Notes (Signed)
S/p R CEA Neuro intact Wound soft Needing meds for HTn, may require nipride if can not be controlled with prn meds.  Target SBP 160   [name]

## 2015-10-10 NOTE — Interval H&P Note (Signed)
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 After consideration of risks, benefits and other options for treatment, the patient has consented to  Procedure(s): ENDARTERECTOMY CAROTID-RIGHT (Right) as a surgical intervention .  The patient's history has been reviewed, patient examined, no change in status, stable for surgery.  I have reviewed the patient's chart and labs.  Questions were answered to the patient's satisfaction.     Annamarie Major  Intermediate risk by cardiology.  We'll proceed with surgery  Annamarie Major

## 2015-10-10 NOTE — Anesthesia Preprocedure Evaluation (Addendum)
Anesthesia Evaluation  Patient identified by MRN, date of birth, ID band Patient awake    Reviewed: Allergy & Precautions, NPO status , Patient's Chart, lab work & pertinent test results  Airway Mallampati: II  TM Distance: >3 FB Neck ROM: Full    Dental no notable dental hx. (+) Poor Dentition   Pulmonary shortness of breath,    Pulmonary exam normal breath sounds clear to auscultation       Cardiovascular hypertension, Pt. on medications and Pt. on home beta blockers + CAD, + Past MI, + Peripheral Vascular Disease and + DOE  Normal cardiovascular exam Rhythm:Regular Rate:Normal  Echo 10/2015 - Left ventricle: The cavity size was normal. Wall thickness was increased in a pattern of severe LVH. Systolic function was normal. The estimated ejection fraction was in the range of 55% to 60%. Left ventricular diastolic function parameters were normal. - Aortic valve: There was mild regurgitation. - Mitral valve: There was mild regurgitation. - Left atrium: The atrium was moderately dilated.   Neuro/Psych CVA negative psych ROS   GI/Hepatic negative GI ROS, Neg liver ROS,   Endo/Other  negative endocrine ROS  Renal/GU CRFRenal disease     Musculoskeletal negative musculoskeletal ROS (+)   Abdominal   Peds  Hematology negative hematology ROS (+) anemia ,   Anesthesia Other Findings   Reproductive/Obstetrics                           Anesthesia Physical Anesthesia Plan  ASA: III  Anesthesia Plan: General   Post-op Pain Management:    Induction: Intravenous  Airway Management Planned: Oral ETT  Additional Equipment: Arterial line  Intra-op Plan:   Post-operative Plan: Extubation in OR  Informed Consent: I have reviewed the patients History and Physical, chart, labs and discussed the procedure including the risks, benefits and alternatives for the proposed anesthesia with the patient or  authorized representative who has indicated his/her understanding and acceptance.   Dental advisory given  Plan Discussed with: CRNA  Anesthesia Plan Comments:         Anesthesia Quick Evaluation

## 2015-10-10 NOTE — Transfer of Care (Signed)
Immediate Anesthesia Transfer of Care Note  Patient: [name]  Procedure(s) Performed: Procedure(s): ENDARTERECTOMY CAROTID-RIGHT (Right)  Patient Location: PACU  Anesthesia Type:General  Level of Consciousness: awake, alert  and oriented  Airway & Oxygen Therapy: Patient Spontanous Breathing and Patient connected to nasal cannula oxygen  Post-op Assessment: Report given to RN and Post -op Vital signs reviewed and stable  Post vital signs: Reviewed and stable  Last Vitals:  Filed Vitals:   10/10/15 1604 10/10/15 1612  BP: 226/130   Pulse: 103 93  Temp:    Resp: 16 22    Complications: No apparent anesthesia complications

## 2015-10-10 NOTE — H&P (View-Only) (Signed)
    Subjective  -  No complaints this am   Physical Exam:  CV:RRR Pulm:CTA Neuro intact except some left leg issues       Assessment/Plan:   Cardiac clearance pending.  Will plan for Right CEA tomorrow if clear by cardiology.  If too high risk for general anesthesia, will plan carotid stent.  NPO after midnight  [place], [place] 10/09/2015 3:25 PM --  Filed Vitals:   10/09/15 1041 10/09/15 1410  BP: 181/78 169/82  Pulse: 82 76  Temp: 97.8 F (36.6 C) 98 F (36.7 C)  Resp: 16 20    Intake/Output Summary (Last 24 hours) at 10/09/15 1525 Last data filed at 10/08/15 2257  Gross per 24 hour  Intake    360 ml  Output    600 ml  Net   -240 ml     Laboratory CBC    Component Value Date/Time   WBC 5.6 10/08/2015 0007   HGB 12.2* 10/08/2015 0007   HCT 38.4* 10/08/2015 0007   PLT 178 10/08/2015 0007    BMET    Component Value Date/Time   NA 142 10/06/2015 1547   K 3.6 10/06/2015 1547   CL 102 10/06/2015 1547   CO2 26 10/06/2015 1542   GLUCOSE 78 10/06/2015 1547   BUN 18 10/06/2015 1547   CREATININE 1.00 10/06/2015 1547   CALCIUM 9.1 10/06/2015 1542   GFRNONAA >60 10/06/2015 1542   GFRAA >60 10/06/2015 1542    COAG Lab Results  Component Value Date   INR 1.13 10/06/2015   INR 1.16 06/19/2015   INR 1.04 11/19/2011   No results found for: PTT  Antibiotics Anti-infectives    Start     Dose/Rate Route Frequency Ordered Stop   10/10/15 1000  cefUROXime (ZINACEF) 1.5 g in dextrose 5 % 50 mL IVPB     1.5 g 100 mL/hr over 30 Minutes Intravenous To ShortStay Surgical 10/09/15 1127 10/11/15 1000       V. Leia Alf, M.D. [facility] Office: [phone] Pager:  [phone]

## 2015-10-10 NOTE — Progress Notes (Signed)
Coronary CTA reviewed with Dr. Meda Coffee.  He has moderate diffuse plaque in the LAD and LCx with calcium score of 980 (81% for age) but no significant obstructive lesions.  He is low risk for proceeding with carotid surgery.  Needs aggressive risk factor modification.

## 2015-10-11 ENCOUNTER — Inpatient Hospital Stay (HOSPITAL_COMMUNITY): Payer: Medicare Other

## 2015-10-11 ENCOUNTER — Encounter (HOSPITAL_COMMUNITY): Payer: Self-pay | Admitting: Surgery

## 2015-10-11 DIAGNOSIS — D62 Acute posthemorrhagic anemia: Secondary | ICD-10-CM

## 2015-10-11 DIAGNOSIS — I639 Cerebral infarction, unspecified: Secondary | ICD-10-CM

## 2015-10-11 DIAGNOSIS — R7303 Prediabetes: Secondary | ICD-10-CM

## 2015-10-11 DIAGNOSIS — I6521 Occlusion and stenosis of right carotid artery: Secondary | ICD-10-CM

## 2015-10-11 DIAGNOSIS — R000 Tachycardia, unspecified: Secondary | ICD-10-CM | POA: Diagnosis present

## 2015-10-11 DIAGNOSIS — R55 Syncope and collapse: Secondary | ICD-10-CM | POA: Diagnosis present

## 2015-10-11 DIAGNOSIS — I480 Paroxysmal atrial fibrillation: Secondary | ICD-10-CM

## 2015-10-11 DIAGNOSIS — I2510 Atherosclerotic heart disease of native coronary artery without angina pectoris: Secondary | ICD-10-CM

## 2015-10-11 DIAGNOSIS — I160 Hypertensive urgency: Secondary | ICD-10-CM

## 2015-10-11 DIAGNOSIS — N400 Enlarged prostate without lower urinary tract symptoms: Secondary | ICD-10-CM

## 2015-10-11 DIAGNOSIS — I10 Essential (primary) hypertension: Secondary | ICD-10-CM

## 2015-10-11 DIAGNOSIS — E785 Hyperlipidemia, unspecified: Secondary | ICD-10-CM

## 2015-10-11 LAB — BASIC METABOLIC PANEL
Anion gap: 7 (ref 5–15)
BUN: 15 mg/dL (ref 6–20)
CALCIUM: 8.1 mg/dL — AB (ref 8.9–10.3)
CO2: 23 mmol/L (ref 22–32)
Chloride: 106 mmol/L (ref 101–111)
Creatinine, Ser: 1.08 mg/dL (ref 0.61–1.24)
GFR calc Af Amer: >60 mL/min (ref >60)
Glucose, Bld: 113 mg/dL — ABNORMAL HIGH (ref 65–99)
POTASSIUM: 4 mmol/L (ref 3.5–5.1)
SODIUM: 136 mmol/L (ref 135–145)

## 2015-10-11 LAB — CBC
HCT: 32 % — ABNORMAL LOW (ref 39.0–52.0)
Hemoglobin: 10.1 g/dL — ABNORMAL LOW (ref 13.0–17.0)
MCH: 21.4 pg — AB (ref 26.0–34.0)
MCHC: 31.6 g/dL (ref 30.0–36.0)
MCV: 67.8 fL — ABNORMAL LOW (ref 78.0–100.0)
PLATELETS: 164 10*3/uL (ref 150–400)
RBC: 4.72 MIL/uL (ref 4.22–5.81)
RDW: 15.9 % — AB (ref 11.5–15.5)
WBC: 8.7 10*3/uL (ref 4.0–10.5)

## 2015-10-11 LAB — GLUCOSE, CAPILLARY: GLUCOSE-CAPILLARY: 138 mg/dL — AB (ref 65–99)

## 2015-10-11 MED ORDER — ASPIRIN 81 MG PO CHEW
81.0000 mg | CHEWABLE_TABLET | Freq: Every day | ORAL | Status: DC
Start: 2015-10-11 — End: 2015-10-14
  Administered 2015-10-12 – 2015-10-14 (×3): 81 mg via ORAL
  Filled 2015-10-11 (×3): qty 1

## 2015-10-11 MED ORDER — ENOXAPARIN SODIUM 40 MG/0.4ML ~~LOC~~ SOLN: 40.0000 mg | Freq: Every day | SUBCUTANEOUS | Status: DC

## 2015-10-11 MED ORDER — WHITE PETROLATUM GEL
Status: AC
  Administered 2015-10-11: 0.2
  Filled 2015-10-11: qty 1

## 2015-10-11 MED ORDER — AMLODIPINE BESYLATE 10 MG PO TABS
10.0000 mg | ORAL_TABLET | Freq: Every day | ORAL | Status: DC
  Administered 2015-10-11 – 2015-10-14 (×4): 10 mg via ORAL
  Filled 2015-10-11 (×4): qty 1

## 2015-10-11 MED ORDER — SODIUM CHLORIDE 0.9 % IV SOLN
INTRAVENOUS | Status: DC
  Administered 2015-10-11: 100 mL/h via INTRAVENOUS

## 2015-10-11 MED ORDER — APIXABAN 5 MG PO TABS
5.0000 mg | ORAL_TABLET | Freq: Two times a day (BID) | ORAL | Status: DC
  Administered 2015-10-11 – 2015-10-14 (×7): 5 mg via ORAL
  Filled 2015-10-11 (×7): qty 1

## 2015-10-11 MED ORDER — TAMSULOSIN HCL 0.4 MG PO CAPS
0.4000 mg | ORAL_CAPSULE | Freq: Every day | ORAL | Status: DC
  Administered 2015-10-11 – 2015-10-13 (×3): 0.4 mg via ORAL
  Filled 2015-10-11 (×3): qty 1

## 2015-10-11 MED ORDER — CARVEDILOL 3.125 MG PO TABS
3.1250 mg | ORAL_TABLET | Freq: Two times a day (BID) | ORAL | Status: DC
  Administered 2015-10-11 – 2015-10-14 (×7): 3.125 mg via ORAL
  Filled 2015-10-11 (×7): qty 1

## 2015-10-11 NOTE — Progress Notes (Signed)
Five personal belonging bags obtained for patient from soiled utility on [address].  [name] present in room when bags brought to room.  Patient's clothes, shoes, two wallets, coat, and variety of personal care items in bags.  All bags placed in room closet.  Patient gave consent for Ms. Jimmye Norman to take his cell phone home.  Charger not present amongst personal items.  Patient stated he "probably didn't bring it".  Patient's glasses present.

## 2015-10-11 NOTE — Consult Note (Signed)
Physical Medicine and Rehabilitation Consult Reason for Consult: Right parietal and right occipital infarcts Referring Physician: [facility]   HPI: [name] is a 75 y.o. right handed male with history of hypertension, hyperlipidemia, coronary artery disease maintained on aspirin 81 mg daily, PAF. Patient lives alone and independent prior to admission and driving but he does have a significant other that helps him when needed but cannot provide 24-hour assistance. One level home with 3 steps to entry. He has a brother in [place] but again cannot provide 24-hour care. Presented 10/07/2015 with left-sided weakness. Blood pressure 220/100. Troponin 0.02. MRI of the brain showed patchy acute ischemic nonhemorrhagic infarct involving the cortical gray matter and deep white matter of the right frontal parietal and occipital lobes. Echocardiogram with ejection fraction of 60%. Normal systolic function. Patient did not receive TPA. CTA of head and neck showed stenosis at the right ICA origin related to low density plaque or thrombus. Superimposed severe intracranial atherosclerotic type changes including high-grade distal right ICA stenosis. Cerebral angiogram showed preocclusive stenosis right ICA proximal. 80% percent stenosis right ICA supraclinoid segment 50% cavernous segment. Vascular surgery consult that underwent right carotid endarterectomy 10/10/2015 per Dr. Trula Slade. Neurology consulted presently remains on aspirin 81 mg daily. Subcutaneous Lovenox added for DVT prophylaxis. Tolerating a regular consistency diet. Physical occupational therapy evaluation completed 10/11/2015 with recommendations of physical medicine and rehabilitation consult.   Review of Systems  Constitutional: Negative for fever and chills.  HENT:       HOH  Eyes: Negative for blurred vision and double vision.  Respiratory: Negative for cough and shortness of breath.   Cardiovascular: Positive for palpitations.  Negative for chest pain and leg swelling.  Gastrointestinal: Positive for constipation. Negative for nausea and vomiting.  Genitourinary: Negative for dysuria.  Musculoskeletal: Positive for myalgias and joint pain.  Skin: Negative for rash.  Neurological: Positive for weakness. Negative for seizures, loss of consciousness and headaches.  Psychiatric/Behavioral: Positive for memory loss.  All other systems reviewed and are negative.  Past Medical History  Diagnosis Date  . Hypertension     echocardiogram 6/10: Moderate LVH, EF 55-65%, mild, mild MR, MAC, mild LAE, PASP 32  . Dyslipidemia   . History of stroke   . CAD (coronary artery disease)     cath in 1/08: EF 60%, mild dilated Ao root, oD2 30%, LAD 20-30%.  . Paroxysmal atrial fibrillation ([place])   . Anemia, iron deficiency 08/15/2011  . History of pneumonia 08/15/2011  . History of MI (myocardial infarction) 08/15/2011  . Dilated aortic root ([place]) 08/15/2011  . Stroke ([place]) 10/2011  . Myocardial infarction ([place])   . Hyperlipidemia   . Shortness of breath dyspnea   . Prediabetes 10/08/2015  . Preoperative cardiovascular examination 10/09/2015   Past Surgical History  Procedure Laterality Date  . Cardiac catheterization    . Endarterectomy Right 10/10/2015    Procedure: ENDARTERECTOMY CAROTID-RIGHT;  Surgeon: Serafina Mitchell, MD;  Location: [facility];  Service: Vascular;  Laterality: Right;   Family History  Problem Relation Age of Onset  . Colon cancer Neg Hx   . Aneurysm Sister    Social History:  reports that he has never smoked. He has never used smokeless tobacco. He reports that he does not drink alcohol or use illicit drugs. Allergies: No Known Allergies Medications Prior to Admission  Medication Sig Dispense Refill  . albuterol (PROVENTIL HFA;VENTOLIN HFA) 108 (90 BASE) MCG/ACT inhaler Inhale 2 puffs into the lungs  every 6 (six) hours as needed for wheezing or shortness of breath. (Patient not taking: Reported on 10/07/2015) 1  Inhaler 2  . amitriptyline (ELAVIL) 50 MG tablet Take 1 tablet (50 mg total) by mouth at bedtime as needed for sleep. (Patient not taking: Reported on 01/21/2015) 90 tablet 1  . amLODipine (NORVASC) 10 MG tablet Take 1 tablet (10 mg total) by mouth daily. (Patient not taking: Reported on 06/19/2015) 30 tablet 0  . aspirin EC 81 MG tablet Take 1 tablet (81 mg total) by mouth daily. (Patient not taking: Reported on 10/07/2015) 30 tablet 0  . atorvastatin (LIPITOR) 80 MG tablet Take 1 tablet (80 mg total) by mouth daily. (Patient not taking: Reported on 01/21/2015) 90 tablet 3  . carvedilol (COREG) 3.125 MG tablet Take 1 tablet (3.125 mg total) by mouth 2 (two) times daily with a meal. (Patient not taking: Reported on 10/07/2015) 60 tablet 0  . furosemide (LASIX) 20 MG tablet Take 1 tablet (20 mg total) by mouth daily. (Patient not taking: Reported on 10/07/2015) 30 tablet 0  . isosorbide mononitrate (IMDUR) 30 MG 24 hr tablet Take 1 tablet (30 mg total) by mouth daily. (Patient not taking: Reported on 10/07/2015) 30 tablet 0  . lisinopril (PRINIVIL,ZESTRIL) 5 MG tablet Take 1 tablet (5 mg total) by mouth daily. (Patient not taking: Reported on 10/07/2015) 30 tablet 0  . tadalafil (CIALIS) 20 MG tablet Take 1 tablet (20 mg total) by mouth daily as needed for erectile dysfunction. (Patient not taking: Reported on 01/21/2015) 5 tablet 11    Home: Home Living Family/patient expects to be discharged to:: Private residence Living Arrangements: Alone Available Help at Discharge: Friend(s), Family (may be able to stay with brother if needed?) Type of Home: House Home Access: Stairs to enter [employer] of Steps: 4 Entrance Stairs-Rails: Left Home Layout: One level Bathroom Shower/Tub: Tub/shower unit, Architectural technologist: Standard Home Equipment: None Additional Comments: Might stay at brother's house at d/c, he lives in [place].  Family visiting pt today and pt will begin converasation with  family about assist avaialble at d/c and where he might go.  Functional History: Prior Function Level of Independence: Independent Comments: Pt drives, cooks, cleans etc. Functional Status:  Mobility: Bed Mobility Overal bed mobility: Needs Assistance Bed Mobility: Supine to Sit Supine to sit: Min guard, HOB elevated General bed mobility comments: Use of bed rail and increased time. Cues to scoot to EOB for improved balance w/ feet on floor. Transfers Overall transfer level: Needs assistance Equipment used: 1 person hand held assist Transfers: Sit to/from Stand Sit to Stand: Min assist Stand pivot transfers: Min assist General transfer comment: Pt requires min assist to steady due to weakness and ataxia in Lt LE.  Cues to stand upright. Ambulation/Gait Ambulation/Gait assistance: Min assist Ambulation Distance (Feet): 100 Feet Assistive device: 1 person hand held assist Gait Pattern/deviations: Ataxic, Trunk flexed, Staggering left, Staggering right General Gait Details: Lt LE ataxia and pt reached out for armrail and bed in hallway to steady.  Min assist to steady as pt staggers Lt and Rt.  Cues for upright posture as pt looking down at feet. Gait velocity interpretation: Below normal speed for age/gender    ADL: ADL Overall ADL's : Needs assistance/impaired Eating/Feeding: Set up, Sitting Eating/Feeding Details (indicate cue type and reason): needed assist opening small packages. Grooming: Wash/dry hands, Wash/dry face, Oral care, Minimal assistance, Sitting Grooming Details (indicate cue type and reason): min assist at times for small fine  motor tasks but with cues could perform on own. Upper Body Bathing: Minimal assitance, Cueing for sequencing, Sitting Upper Body Bathing Details (indicate cue type and reason): cues to stay on task. Lower Body Bathing: Minimal assistance, Sit to/from stand, Cueing for sequencing Lower Body Bathing Details (indicate cue type and reason): min  assist when on his feet due to weakness in LLE.  Pt not fully steady on his feet in standing. Upper Body Dressing : Set up, Sitting Upper Body Dressing Details (indicate cue type and reason): assist with buttons and fastners. Lower Body Dressing: Minimal assistance, Sit to/from stand, Cueing for sequencing Lower Body Dressing Details (indicate cue type and reason): Min assist to stand and fasten pants and keep balance. Toilet Transfer: Minimal assistance, Ambulation, Grab bars, Regular Toilet Toilet Transfer Details (indicate cue type and reason): walked to toilet Toileting- Clothing Manipulation and Hygiene: Supervision/safety, Sitting/lateral lean Functional mobility during ADLs: Minimal assistance General ADL Comments: Pt requires min assist for most adls due to decreased balance in standing and mild cognitive deficits.    Cognition: Cognition Overall Cognitive Status: Impaired/Different from baseline Arousal/Alertness: Awake/alert Orientation Level: Oriented to person, Oriented to place, Oriented to time, Oriented to situation Attention: Sustained Sustained Attention: Appears intact Memory: Impaired Memory Impairment: Decreased recall of new information Awareness: Impaired Awareness Impairment: Emergent impairment Problem Solving: Impaired Problem Solving Impairment: Functional basic Safety/Judgment: Appears intact Cognition Arousal/Alertness: Awake/alert Behavior During Therapy: WFL for tasks assessed/performed Overall Cognitive Status: Impaired/Different from baseline Area of Impairment: Memory, Problem solving Memory: Decreased short-term memory Problem Solving: Slow processing General Comments: Overall, pt performed well with basic safety questions etc. Pt just appears a little "off" when answering questions.  Pt had difficulty answering problem solving questions directly but eventually would come to correct answer with cues.  Blood pressure 191/90, pulse 89, temperature  97.7 F (36.5 C), temperature source Oral, resp. rate 13, height 5\' 6"  (1.676 m), weight 78.291 kg (172 lb 9.6 oz), SpO2 100 %. Physical Exam  Vitals reviewed. Constitutional: He appears well-developed and well-nourished.  HENT:  Head: Normocephalic and atraumatic.  Eyes: Conjunctivae and EOM are normal.  Neck: Normal range of motion. Neck supple. No thyromegaly present.  Cardiovascular: Normal rate and regular rhythm.   Respiratory: Effort normal and breath sounds normal. No respiratory distress.  GI: Soft. Bowel sounds are normal. He exhibits no distension.  Musculoskeletal: He exhibits no edema or tenderness.  Neurological: He is alert.  Mood is a bit flat but appropriate.  He has made good eye contact with examiner and follows simple commands.  Noted some delay in processing but appropriate for name, age and place.  He does have awareness of his deficits Sensation intact to light touch DTRs symmetric Motor: b/l UE 4+/5, B/l LE 5/5 (right slightly stronger than left)  Mild ataxia LUE>RUE  Skin: Skin is warm and dry.  Psychiatric: He has a normal mood and affect. His behavior is normal.   Results for orders placed or performed during the hospital encounter of 10/06/15 (from the past 24 hour(s))  Type and screen [place]     Status: None   Collection Time: 10/10/15  1:14 PM  Result Value Ref Range   ABO/RH(D) B POS    Antibody Screen NEG    Sample Expiration 10/13/2015   ABO/Rh     Status: None   Collection Time: 10/10/15  1:14 PM  Result Value Ref Range   ABO/RH(D) B POS   CBC  Status: Abnormal   Collection Time: 10/11/15  4:00 AM  Result Value Ref Range   WBC 8.7 4.0 - 10.5 K/uL   RBC 4.72 4.22 - 5.81 MIL/uL   Hemoglobin 10.1 (L) 13.0 - 17.0 g/dL   HCT 32.0 (L) 39.0 - 52.0 %   MCV 67.8 (L) 78.0 - 100.0 fL   MCH 21.4 (L) 26.0 - 34.0 pg   MCHC 31.6 30.0 - 36.0 g/dL   RDW 15.9 (H) 11.5 - 15.5 %   Platelets 164 150 - 400 K/uL  Basic metabolic  panel     Status: Abnormal   Collection Time: 10/11/15  4:00 AM  Result Value Ref Range   Sodium 136 135 - 145 mmol/L   Potassium 4.0 3.5 - 5.1 mmol/L   Chloride 106 101 - 111 mmol/L   CO2 23 22 - 32 mmol/L   Glucose, Bld 113 (H) 65 - 99 mg/dL   BUN 15 6 - 20 mg/dL   Creatinine, Ser 1.08 0.61 - 1.24 mg/dL   Calcium 8.1 (L) 8.9 - 10.3 mg/dL   GFR calc non Af Amer >60 >60 mL/min   GFR calc Af Amer >60 >60 mL/min   Anion gap 7 5 - 15   Ct Coronary Morp W/cta Cor W/score W/ca W/cm &/or Wo/cm  10/10/2015  ADDENDUM REPORT: 10/10/2015 10:55 CLINICAL DATA:  Chest pain EXAM: Cardiac/Coronary  CT TECHNIQUE: The patient was scanned on a Philips 256 scanner. FINDINGS: A 120 kV prospective scan was triggered in the descending thoracic aorta at 111 HU's. Axial non-contrast 3 mm slices were carried out through the heart. The data set was analyzed on a dedicated work station and scored using the [place]. Gantry rotation speed was 270 msecs and collimation was .9 mm. 5 mg of iv Metoprolol and 0.8 mg of sl NTG was given. The 3D data set was reconstructed in 5% intervals of the 67-82 % of the R-R cycle. Diastolic phases were analyzed on a dedicated work station using MPR, MIP and VRT modes. The patient received 80 cc of contrast. Aorta:  Normal size, minimal calcifications, no dissection. Aortic Valve:  Trileaflet, minimal calcifications. Coronary Arteries: Normal origin.  Left dominance. Left main is a large artery that has mild noncalcified plaque in the proximal segment and mild calcified plaque in the distal portion with associated stenosis 25-50%. LAD is a large artery that has moderate diffuse mixed, predominantly calcified plaque in the proximal and mid segments with associated stenosis 50-69%. Distal LAD has only mild plaque. The first diagonal branch has only minimal plaque, the second diagonal branch has mild calcified ostial plaque. LCX artery has diffuse moderate predominantly calcified plaque in its  mid portion with associated stenosis 50-69%. There is a focal spot at the distal LCX artery suspicious of stenosis close to 70%. PLA and PDA have minimal plaque. RCA is a very small non-dominant artery with no obvious plaque. IMPRESSION: 1. Coronary calcium score of 980. This was 25 percentile for age and sex matched control. 2. Normal coronary origin with left dominance. 3. Diffuse moderate plaque in the LAD and LCX arteries. An aggressive medical therapy is recommended. Ena Dawley Electronically Signed   By: Ena Dawley   On: 10/10/2015 10:55  10/10/2015  EXAM: OVER-READ INTERPRETATION  CT CHEST The following report is an over-read performed by radiologist Dr. Rebekah Chesterfield [facility] on 10/10/2015. This over-read does not include interpretation of cardiac or coronary anatomy or pathology. The coronary calcium score/coronary CTA interpretation by  the cardiologist is attached. COMPARISON:  No priors. FINDINGS: In the medial aspect of the left lower lobe there is a 12 x 15 mm area of nodular architectural distortion (image 56 of series 304). This is somewhat similar in retrospect to remote prior study 06/09/2007, although slightly more bulky. 6 mm left upper lobe pulmonary nodule (image 29 of series 304). 3 mm left upper lobe nodule (image 39 of series 304). 11 x 7 mm nodule in the inferior aspect of the right upper lobe abutting the minor fissure (image 24 of series 304). No acute consolidative airspace disease. Trace bilateral pleural effusions lying dependently. Areas of ground-glass attenuation and mild interlobular septal thickening are noted throughout the lungs bilaterally, suggesting a background of mild interstitial pulmonary edema. No definite lymphadenopathy within the visualized thorax. Visualized portions of the upper abdomen are remarkable for atherosclerosis. There are no aggressive appearing lytic or blastic lesions noted in the visualized portions of the skeleton.  IMPRESSION: 1. Multiple pulmonary nodules scattered throughout the lungs bilaterally. Although the largest of these has been present on prior study dating back to 2008, it does appear slightly more bulky. This largest nodule is favored to represent an area of scarring, however, there are several other nodules that warrant attention on followup studies, and further evaluation with PET-CT is suggested at this time. 2. Evidence of mild interstitial pulmonary edema. 3. Trace bilateral pleural effusions bilaterally. These results will be called to the ordering clinician or representative by the Radiologist Assistant, and communication documented in the PACS or zVision Dashboard. Electronically Signed: By: Vinnie Langton M.D. On: 10/10/2015 09:57    Assessment/Plan: Diagnosis: Right parietal and right occipital infarcts Labs and images independently reviewed.  Records reviewed and summated above. Stroke: Continue secondary stroke prophylaxis and Risk Factor Modification listed below:   Antiplatelet therapy:   Blood Pressure Management:  Continue current medication with prn's with permisive HTN per primary team Statin Agent:   Pre-Diabetes management:    1. Does the need for close, 24 hr/day medical supervision in concert with the patient's rehab needs make it unreasonable for this patient to be served in a less intensive setting? Potentially  2. Co-Morbidities requiring supervision/potential complications: HTN (monitor and provide prns in accordance with increased physical exertion and pain), hyperlipidemia (Cont meds), coronary artery disease (Cont meds), PAF (Cont meds, monitor HR with increased physical activity), Tachycardia (monitor in accordance with pain and increasing activity), ABLA (transfuse if necessary to ensure appropriate perfusion for increased activity tolerance), BPH (cont meds), Prediabetes (Monitor in accordance with exercise and adjust meds as necessary) 3. Due to bladder management,  safety, disease management, medication administration and patient education, does the patient require 24 hr/day rehab nursing? Yes 4. Does the patient require coordinated care of a physician, rehab nurse, PT (1-2 hrs/day, 5 days/week) and OT (1-2 hrs/day, 5 days/week) to address physical and functional deficits in the context of the above medical diagnosis(es)? Yes Addressing deficits in the following areas: balance, endurance, locomotion, toileting and psychosocial support 5. Can the patient actively participate in an intensive therapy program of at least 3 hrs of therapy per day at least 5 days per week? Yes 6. The potential for patient to make measurable gains while on inpatient rehab is excellent 7. Anticipated functional outcomes upon discharge from inpatient rehab are modified independent  with PT, modified independent with OT, n/a with SLP. 8. Estimated rehab length of stay to reach the above functional goals is: 12-15 days. 9. Does the patient have  adequate social supports and living environment to accommodate these discharge functional goals? Yes 10. Anticipated D/C setting: Home 11. Anticipated post D/C treatments: HH therapy and Home excercise program 12. Overall Rehab/Functional Prognosis: excellent  RECOMMENDATIONS: This patient's condition is appropriate for continued rehabilitative care in the following setting: CIR once medically stable (improvement in HR and BP) Patient has agreed to participate in recommended program. Yes Note that insurance prior authorization may be required for reimbursement for recommended care.  Comment: Rehab Admissions Coordinator to follow up.  Delice Lesch, MD 10/11/2015

## 2015-10-11 NOTE — Evaluation (Signed)
Occupational Therapy Evaluation Patient Details Name: [name] MRN: [id number] DOB: [birth year] Today's Date: 10/11/2015    History of Present Illness Pt is a 75 yo male admitted with L leg weakness and numbness.  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  Pt had RCEA.  Pt with prior h/o CVA and MI.     Clinical Impression   Pt admitted with the above diagnosis and has the deficits listed below. Pt would benefit from cont OT to increase independence with all adls so he can eventually d/c back home alone. Pt would benefit from an acute rehab stay and may NOT need 24 hour S if he goes to rehab.  It appears family may be available to assist pt after rehab if necessary. Family coming today to have that conversation since pt normally lives alone.      Follow Up Recommendations  CIR;Supervision/Assistance - 24 hour (if goes to rehab, may not need 24 hour S at d/c)    Equipment Recommendations  3 in 1 bedside comode    Recommendations for Other Services Rehab consult     Precautions / Restrictions Precautions Precautions: Fall Restrictions Weight Bearing Restrictions: No Other Position/Activity Restrictions: Pt states it is uncomfortabel to turn head to the right.      Mobility Bed Mobility               General bed mobility comments: pt in chair on arrival.  Transfers Overall transfer level: Needs assistance Equipment used: 1 person hand held assist Transfers: Sit to/from Stand;Stand Pivot Transfers Sit to Stand: Min assist Stand pivot transfers: Min assist       General transfer comment: Pt requires min assist to steady self due to weakness in LLE.    Balance Overall balance assessment: Needs assistance Sitting-balance support: Feet supported Sitting balance-Leahy Scale: Good     Standing balance support: Bilateral upper extremity supported;During functional activity Standing balance-Leahy Scale: Fair Standing  balance comment: Pt could stand stationary for a few seconds w/o support. Pt could not take challenges while standing unsupported.                            ADL Overall ADL's : Needs assistance/impaired Eating/Feeding: Set up;Sitting Eating/Feeding Details (indicate cue type and reason): needed assist opening small packages. Grooming: Wash/dry hands;Wash/dry face;Oral care;Minimal assistance;Sitting Grooming Details (indicate cue type and reason): min assist at times for small fine motor tasks but with cues could perform on own. Upper Body Bathing: Minimal assitance;Cueing for sequencing;Sitting Upper Body Bathing Details (indicate cue type and reason): cues to stay on task. Lower Body Bathing: Minimal assistance;Sit to/from stand;Cueing for sequencing Lower Body Bathing Details (indicate cue type and reason): min assist when on his feet due to weakness in LLE.  Pt not fully steady on his feet in standing. Upper Body Dressing : Set up;Sitting Upper Body Dressing Details (indicate cue type and reason): assist with buttons and fastners. Lower Body Dressing: Minimal assistance;Sit to/from stand;Cueing for sequencing Lower Body Dressing Details (indicate cue type and reason): Min assist to stand and fasten pants and keep balance. Toilet Transfer: Minimal assistance;Ambulation;Grab Information systems manager Details (indicate cue type and reason): walked to toilet Toileting- Clothing Manipulation and Hygiene: Supervision/safety;Sitting/lateral lean       Functional mobility during ADLs: Minimal assistance General ADL Comments: Pt requires min assist for most adls due to decreased balance in standing and  mild cognitive deficits.       Vision Vision Assessment?: Yes Eye Alignment: Within Functional Limits Ocular Range of Motion: Within Functional Limits Alignment/Gaze Preference: Within Defined Limits Tracking/Visual Pursuits: Able to track stimulus in all quads without  difficulty Visual Fields: No apparent deficits   Perception Perception Perception Tested?: Yes   Praxis Praxis Praxis tested?: Within functional limits    Pertinent Vitals/Pain Pain Assessment: Faces Faces Pain Scale: Hurts a little bit Pain Location: R neck/side of head Pain Descriptors / Indicators: Pressure;Sore;Tender Pain Intervention(s): Monitored during session     Hand Dominance Right   Extremity/Trunk Assessment Upper Extremity Assessment Upper Extremity Assessment: LUE deficits/detail LUE Deficits / Details: LUE functional but not as strong as RUE.  4/5 strength throughout. LUE Sensation: decreased light touch LUE Coordination: decreased fine motor   Lower Extremity Assessment Lower Extremity Assessment: Defer to PT evaluation   Cervical / Trunk Assessment Cervical / Trunk Assessment: Other exceptions Cervical / Trunk Exceptions: pt with some swelling in R side of neck. Pt c/o "funny sensation" when he turns his neck to the right.   Communication Communication Communication: Other (comment) (words seemed slurred at times.  Friend states it is baseline)   Cognition Arousal/Alertness: Awake/alert Behavior During Therapy: WFL for tasks assessed/performed Overall Cognitive Status: Impaired/Different from baseline Area of Impairment: Memory;Problem solving     Memory: Decreased short-term memory       Problem Solving: Slow processing General Comments: Overall, pt performed well with basic safety questions etc. Pt just appears a little "off" when answering questions.  Pt had difficulty answering problem solving questions directly but eventually would come to correct answer with cues.   General Comments       Exercises       Shoulder Instructions      Home Living Family/patient expects to be discharged to:: Private residence Living Arrangements: Alone Available Help at Discharge: Friend(s);Family (may be able to stay with brother if needed?) Type of  Home: House Home Access: Stairs to enter [employer] of Steps: 4 Entrance Stairs-Rails: Left Home Layout: One level     Bathroom Shower/Tub: Tub/shower unit;Curtain Shower/tub characteristics: Architectural technologist: Standard     Home Equipment: None   Additional Comments: Might stay at brother's house at d/c, [place]  Family visiting pt today and pt will begin converasation with family about assist avaialble at d/c and where he might go.      Prior Functioning/Environment Level of Independence: Independent        Comments: Pt drives, cooks, cleans etc.    OT Diagnosis: Generalized weakness;Cognitive deficits;Acute pain;Hemiplegia non-dominant side   OT Problem List: Decreased strength;Impaired balance (sitting and/or standing);Decreased coordination;Decreased cognition;Decreased safety awareness;Decreased knowledge of use of DME or AE;Impaired UE functional use;Pain;Increased edema   OT Treatment/Interventions: Self-care/ADL training;DME and/or AE instruction;Therapeutic activities;Balance training;Cognitive remediation/compensation    OT Goals(Current goals can be found in the care plan section) Acute Rehab OT Goals Patient Stated Goal: to be well again. OT Goal Formulation: With patient Time For Goal Achievement: 10/25/15 Potential to Achieve Goals: Good ADL Goals Pt Will Perform Grooming: with supervision;standing Pt Will Perform Lower Body Bathing: with supervision;sit to/from stand Pt Will Perform Lower Body Dressing: with supervision;sit to/from stand Pt Will Perform Tub/Shower Transfer: Tub transfer;shower seat;ambulating;with supervision Pt/caregiver will Perform Home Exercise Program: Left upper extremity;With Supervision;With written HEP provided;With theraputty Additional ADL Goal #1: Pt will walk to bathroom and toilet on 3:1 over commode with S.  OT Frequency:  Min 3X/week   Barriers to D/C: Decreased caregiver support  Pt lives  alone but may be able to stay with brother at d/c.       Co-evaluation              End of Session Nurse Communication: Mobility status;Other (comment) (glasses missing.  BP 191/92)  Activity Tolerance: Patient tolerated treatment well Patient left: in chair;with call bell/phone within reach;with family/visitor present;with chair alarm set   Time: [id number] OT Time Calculation (min): 24 min Charges:  OT General Charges $OT Visit: 1 Procedure OT Evaluation $OT Eval Moderate Complexity: 1 Procedure OT Treatments $Self Care/Home Management : 8-22 mins G-Codes:    Glenford Peers 10-21-2015, 10:05 AM  [phone]

## 2015-10-11 NOTE — Progress Notes (Signed)
SUBJECTIVE:  Doing well .  No complaints  OBJECTIVE:   Vitals:   Filed Vitals:   10/11/15 0205 10/11/15 0400 10/11/15 0435 10/11/15 0814  BP: 178/98  178/91 174/95  Pulse: 71  89   Temp:  97.5 F (36.4 C)  97.7 F (36.5 C)  TempSrc:  Oral  Oral  Resp: 16  13   Height:      Weight:      SpO2: 100%  97% 100%   I&O's:   Intake/Output Summary (Last 24 hours) at 10/11/15 0947 Last data filed at 10/11/15 0600  Gross per 24 hour  Intake   2330 ml  Output   1425 ml  Net    905 ml   TELEMETRY: Reviewed telemetry pt in NSR:     PHYSICAL EXAM General: Well developed, well nourished, in no acute distress Head: Eyes PERRLA, No xanthomas.   Normal cephalic and atramatic  Lungs:   Clear bilaterally to auscultation and percussion. Heart:   HRRR S1 S2 Pulses are 2+ & equal. Abdomen: Bowel sounds are positive, abdomen soft and non-tender without masses  Extremities:   No clubbing, cyanosis or edema.  DP +1 Neuro: Alert and oriented X 3. Psych:  Good affect, responds appropriately   LABS: Basic Metabolic Panel:  Recent Labs  10/10/15 0552 10/11/15 0400  NA 139 136  K 3.7 4.0  CL 107 106  CO2 22 23  GLUCOSE 95 113*  BUN 19 15  CREATININE 1.11 1.08  CALCIUM 8.7* 8.1*   Liver Function Tests: No results for input(s): AST, ALT, ALKPHOS, BILITOT, PROT, ALBUMIN in the last 72 hours. No results for input(s): LIPASE, AMYLASE in the last 72 hours. CBC:  Recent Labs  10/10/15 0552 10/11/15 0400  WBC 5.8 8.7  HGB 11.4* 10.1*  HCT 35.9* 32.0*  MCV 68.0* 67.8*  PLT 165 164   Cardiac Enzymes: No results for input(s): CKTOTAL, CKMB, CKMBINDEX, TROPONINI in the last 72 hours. BNP: Invalid input(s): POCBNP D-Dimer: No results for input(s): DDIMER in the last 72 hours. Hemoglobin A1C: No results for input(s): HGBA1C in the last 72 hours. Fasting Lipid Panel: No results for input(s): CHOL, HDL, LDLCALC, TRIG, CHOLHDL, LDLDIRECT in the last 72 hours. Thyroid Function  Tests: No results for input(s): TSH, T4TOTAL, T3FREE, THYROIDAB in the last 72 hours.  Invalid input(s): FREET3 Anemia Panel: No results for input(s): VITAMINB12, FOLATE, FERRITIN, TIBC, IRON, RETICCTPCT in the last 72 hours. Coag Panel:   Lab Results  Component Value Date   INR 1.13 10/06/2015   INR 1.16 06/19/2015   INR 1.04 11/19/2011    RADIOLOGY: Ct Angio Head W/cm &/or Wo Cm  10/07/2015  CLINICAL DATA:  75 year old male with acute right hemisphere infarcts, mostly right MCA territory, following presentation for left lower extremity weakness. Initial encounter. EXAM: CT ANGIOGRAPHY HEAD AND NECK TECHNIQUE: Multidetector CT imaging of the head and neck was performed using the standard protocol during bolus administration of intravenous contrast. Multiplanar CT image reconstructions and MIPs were obtained to evaluate the vascular anatomy. Carotid stenosis measurements (when applicable) are obtained utilizing NASCET criteria, using the distal internal carotid diameter as the denominator. CONTRAST:  44mL OMNIPAQUE IOHEXOL 350 MG/ML SOLN COMPARISON:  Brain MRI 10/06/2015 and earlier, including intracranial MRA a 11/20/2011. FINDINGS: CTA NECK Skeleton: Intermittent poor dentition. Widespread chronic cervical spine disc and endplate degeneration. No acute osseous abnormality identified. Mild paranasal sinus mucosal thickening. That in the left maxillary sinus might be associated with a small supernumerary  tooth or tooth fragment (series 6, image 76). Tympanic cavities and mastoids appear clear. Other neck: Negative lung apices. No superior mediastinal lymphadenopathy. Thyroid, larynx, pharynx, parapharyngeal spaces, retropharyngeal space, sublingual space, submandibular glands, and parotid glands are within normal limits. Visualized orbits and scalp soft tissues are within normal limits. No cervical lymphadenopathy. Aortic arch: Bovine arch configuration. Mild to moderate arch atherosclerosis, with both  soft and calcified plaque. Great vessel dolichoectasia and tortuosity. Right carotid system: No brachiocephalic artery or right CCA origin stenosis. Tortuous proximal right CCA. Soft and calcified plaque at the right carotid bifurcation with high-grade string sign stenosis, with low-density plaque affecting the proximal 10 mm of the vessel. See sagittal image 57 of series 10. Despite this the right ICA remains patent. The vessel is mildly tortuous with a partially retropharyngeal course. Left carotid system: Bovine left CCA origin without stenosis. Mild calcified plaque at the left carotid bifurcation, widely patent left ICA origin and bulb. Tortuous but otherwise negative cervical left ICA, with a partially retropharyngeal course. Vertebral arteries:No proximal right subclavian artery stenosis despite calcified plaque. Calcified plaque near the right vertebral artery origin but without origin stenosis. However, there is soft plaque affecting the right V1 segment with moderate stenosis best seen on series 9, image 144. Occasionally tortuous right vertebral the 2 segment, and occasional calcified plaque but no other cervical right vertebral artery stenosis. No proximal left subclavian artery stenosis. Mildly dominant left vertebral artery with no origin stenosis despite soft plaque. V3 segment calcified plaque but no cervical left vertebral artery stenosis. CTA HEAD Posterior circulation: Bilateral calcified plaque affecting the vertebral arteries at the skullbase, however, a moderate to severe bilateral distal vertebral artery stenosis occurs due to soft plaque. Subsequent high-grade stenosis in both distal vertebral arteries and at the vertebrobasilar junction. Moderately to severely irregular basilar artery with additional tandem stenoses. However, the basilar remains patent. Severe irregularity and stenosis at both PCA origins and also affecting the SCA origins to a lesser extent. Posterior communicating arteries  might be present but are diminutive and irregular. Bilateral PCA branches are severely irregular and stenotic, worse on the right. Anterior circulation: Proximal ICA siphons are patent without plaque. However, there is moderate to severe calcified plaque from the cavernous segments to the supraclinoid ICAs. On the right this results in high-grade supraclinoid ICA stenosis best seen on series 8, image 111. There is moderate stenosis on the left. There is additional moderate to severe distal right ICA/terminus stenosis related to calcified plaque best seen on series 8, image 110. Despite these findings both carotid termini are patent. There is irregularity at both MCA and ACA origins which remain patent. Severe stenosis of the right A1. The anterior communicating artery is present. Moderate to severe bilateral A2 segment irregularity and multifocal stenoses, best seen on series 13, image 18. Left MCA origin is patent with mild irregularity. Mild left M1 irregularity, with moderate Left MCA bifurcation is patent multifocal proximal M2 branch irregularity and stenosis best seen on series 11, image 22. On the right the MCA origin is mildly stenotic. The M1 segment is mildly to moderately irregular. The right MCA bifurcation is. There is moderate multifocal irregularity and stenosis in the dominant anterior M2 branch with attenuated anterior division MCA enhancement. Mild posterior division vessel irregularity. Venous sinuses: Patent. Anatomic variants: Bovine arch configuration. Delayed phase: No abnormal enhancement identified. Recently seen scattered small right MCA infarcts remain largely occult on CT. No acute intracranial hemorrhage identified. No intracranial mass effect. IMPRESSION: 1.  Radiographic String Sign Stenosis at the right ICA origin related to Low-density plaque or thrombus, however, there is superimposed severe intracranial atherosclerosis including high-grade distal right ICA stenosis in part due to  bulky calcified plaque. 2. Widespread intracranial moderate to severe first and second order vessel irregularity and stenosis, including severe stenoses at the: -bilateral distal vertebral arteries, - proximal basilar artery, - bilateral PCAs, - right ACA A1, - bilateral ACA distal A2 segments. 3. No intracranial hemorrhage or mass effect. Salient findings discussed by telephone with Neurology NP Burnetta Sabin on 10/07/2015 at 1142 hours. Electronically Signed   By: Genevie Ann M.D.   On: 10/07/2015 11:45   Ct Head Wo Contrast  10/06/2015  CLINICAL DATA:  Left leg weakness. History of left MCA territory infarct. EXAM: CT HEAD WITHOUT CONTRAST TECHNIQUE: Contiguous axial images were obtained from the base of the skull through the vertex without intravenous contrast. COMPARISON:  Brain MR 11/20/2011.  CT 11/19/2011. FINDINGS: Sinuses/Soft tissues: Mucosal thickening of left maxillary sinus ain ethmoid air cells. Clear mastoid air cells. Intracranial: Mild low density in the periventricular white matter likely related to small vessel disease. Cerebral and cerebellar atrophy. Vertebral and carotid atherosclerosis bilaterally. No mass lesion, hemorrhage, hydrocephalus, acute infarct, intra-axial, or extra-axial fluid collection. IMPRESSION: 1.  No acute intracranial abnormality. 2.  Cerebral atrophy and small vessel ischemic change. 3. Sinus disease. Electronically Signed   By: Abigail Miyamoto M.D.   On: 10/06/2015 17:15   Ct Angio Neck W/cm &/or Wo/cm  10/07/2015  CLINICAL DATA:  75 year old male with acute right hemisphere infarcts, mostly right MCA territory, following presentation for left lower extremity weakness. Initial encounter. EXAM: CT ANGIOGRAPHY HEAD AND NECK TECHNIQUE: Multidetector CT imaging of the head and neck was performed using the standard protocol during bolus administration of intravenous contrast. Multiplanar CT image reconstructions and MIPs were obtained to evaluate the vascular anatomy. Carotid  stenosis measurements (when applicable) are obtained utilizing NASCET criteria, using the distal internal carotid diameter as the denominator. CONTRAST:  37mL OMNIPAQUE IOHEXOL 350 MG/ML SOLN COMPARISON:  Brain MRI 10/06/2015 and earlier, including intracranial MRA a 11/20/2011. FINDINGS: CTA NECK Skeleton: Intermittent poor dentition. Widespread chronic cervical spine disc and endplate degeneration. No acute osseous abnormality identified. Mild paranasal sinus mucosal thickening. That in the left maxillary sinus might be associated with a small supernumerary tooth or tooth fragment (series 6, image 76). Tympanic cavities and mastoids appear clear. Other neck: Negative lung apices. No superior mediastinal lymphadenopathy. Thyroid, larynx, pharynx, parapharyngeal spaces, retropharyngeal space, sublingual space, submandibular glands, and parotid glands are within normal limits. Visualized orbits and scalp soft tissues are within normal limits. No cervical lymphadenopathy. Aortic arch: Bovine arch configuration. Mild to moderate arch atherosclerosis, with both soft and calcified plaque. Great vessel dolichoectasia and tortuosity. Right carotid system: No brachiocephalic artery or right CCA origin stenosis. Tortuous proximal right CCA. Soft and calcified plaque at the right carotid bifurcation with high-grade string sign stenosis, with low-density plaque affecting the proximal 10 mm of the vessel. See sagittal image 57 of series 10. Despite this the right ICA remains patent. The vessel is mildly tortuous with a partially retropharyngeal course. Left carotid system: Bovine left CCA origin without stenosis. Mild calcified plaque at the left carotid bifurcation, widely patent left ICA origin and bulb. Tortuous but otherwise negative cervical left ICA, with a partially retropharyngeal course. Vertebral arteries:No proximal right subclavian artery stenosis despite calcified plaque. Calcified plaque near the right vertebral  artery origin but without origin stenosis.  However, there is soft plaque affecting the right V1 segment with moderate stenosis best seen on series 9, image 144. Occasionally tortuous right vertebral the 2 segment, and occasional calcified plaque but no other cervical right vertebral artery stenosis. No proximal left subclavian artery stenosis. Mildly dominant left vertebral artery with no origin stenosis despite soft plaque. V3 segment calcified plaque but no cervical left vertebral artery stenosis. CTA HEAD Posterior circulation: Bilateral calcified plaque affecting the vertebral arteries at the skullbase, however, a moderate to severe bilateral distal vertebral artery stenosis occurs due to soft plaque. Subsequent high-grade stenosis in both distal vertebral arteries and at the vertebrobasilar junction. Moderately to severely irregular basilar artery with additional tandem stenoses. However, the basilar remains patent. Severe irregularity and stenosis at both PCA origins and also affecting the SCA origins to a lesser extent. Posterior communicating arteries might be present but are diminutive and irregular. Bilateral PCA branches are severely irregular and stenotic, worse on the right. Anterior circulation: Proximal ICA siphons are patent without plaque. However, there is moderate to severe calcified plaque from the cavernous segments to the supraclinoid ICAs. On the right this results in high-grade supraclinoid ICA stenosis best seen on series 8, image 111. There is moderate stenosis on the left. There is additional moderate to severe distal right ICA/terminus stenosis related to calcified plaque best seen on series 8, image 110. Despite these findings both carotid termini are patent. There is irregularity at both MCA and ACA origins which remain patent. Severe stenosis of the right A1. The anterior communicating artery is present. Moderate to severe bilateral A2 segment irregularity and multifocal stenoses, best  seen on series 13, image 18. Left MCA origin is patent with mild irregularity. Mild left M1 irregularity, with moderate Left MCA bifurcation is patent multifocal proximal M2 branch irregularity and stenosis best seen on series 11, image 22. On the right the MCA origin is mildly stenotic. The M1 segment is mildly to moderately irregular. The right MCA bifurcation is. There is moderate multifocal irregularity and stenosis in the dominant anterior M2 branch with attenuated anterior division MCA enhancement. Mild posterior division vessel irregularity. Venous sinuses: Patent. Anatomic variants: Bovine arch configuration. Delayed phase: No abnormal enhancement identified. Recently seen scattered small right MCA infarcts remain largely occult on CT. No acute intracranial hemorrhage identified. No intracranial mass effect. IMPRESSION: 1. Radiographic String Sign Stenosis at the right ICA origin related to Low-density plaque or thrombus, however, there is superimposed severe intracranial atherosclerosis including high-grade distal right ICA stenosis in part due to bulky calcified plaque. 2. Widespread intracranial moderate to severe first and second order vessel irregularity and stenosis, including severe stenoses at the: -bilateral distal vertebral arteries, - proximal basilar artery, - bilateral PCAs, - right ACA A1, - bilateral ACA distal A2 segments. 3. No intracranial hemorrhage or mass effect. Salient findings discussed by telephone with Neurology NP Burnetta Sabin on 10/07/2015 at 1142 hours. Electronically Signed   By: Genevie Ann M.D.   On: 10/07/2015 11:45   Mr Brain Wo Contrast  10/06/2015  CLINICAL DATA:  Initial evaluation for acute left leg weakness. EXAM: MRI HEAD WITHOUT CONTRAST TECHNIQUE: Multiplanar, multiecho pulse sequences of the brain and surrounding structures were obtained without intravenous contrast. COMPARISON:  Prior CT from earlier the same day. FINDINGS: There are multi focal patchy areas of  restricted diffusion involving the cortical gray matter and subcortical white matter of the right frontal and parietal lobes. Few scattered small foci of patchy ischemic infarcts present within the  right occipital lobe as well. These primarily involve the cortical gray matter and deep white matter of the centrum semi ovale. No basal ganglia infarct. No associated hemorrhage or significant mass effect. No other infarct involving the left cerebral hemisphere or infratentorial brain. Circumferential FLAIR signal within the visualized distal cervical/petrous right ICA suggestive of possible slow flow and/or partial occlusion or atheromatous disease. Major intracranial vascular flow voids otherwise maintained. Diffuse prominence of the CSF containing spaces compatible with generalized cerebral atrophy. Patchy T2/FLAIR hyperintensity within the periventricular and deep white matter both cerebral hemispheres most consistent with chronic small vessel ischemic disease, moderate in nature. Small vessel type changes present within the pons. Few scattered small remote lacunar infarcts within the bilateral corona radiata. Small remote lacunar infarct within the left thalamus. No mass lesion, midline shift, or mass effect. No hydrocephalus. No extra-axial fluid collection. Major dural sinuses are grossly patent. Craniocervical junction within normal limits. Mild degenerative spondylolysis present within the upper cervical spine without significant stenosis. Pituitary gland normal.  No acute abnormality about the orbits. Scattered mucosal thickening within the paranasal sinuses. Small air-fluid level within the left maxillary sinuses. No mastoid effusion. Inner ear structures within normal limits. Bone marrow signal intensity normal. No scalp soft tissue abnormality. IMPRESSION: 1. Patchy acute ischemic nonhemorrhagic infarcts involving the cortical gray matter and deep white matter of the right frontal, right parietal, and right  occipital lobes. No associated mass effect. 2. Subtle abnormal flow void within the distal right ICA, which may reflect underlying atheromatous disease, slow flow, and/or partial occlusion. 3. Generalized cerebral atrophy with moderate chronic small vessel ischemic disease. 4. Mild paranasal sinus disease as above. Electronically Signed   By: Jeannine Boga M.D.   On: 10/06/2015 22:30   Ct Coronary Morp W/cta Cor W/score W/ca W/cm &/or Wo/cm  10/10/2015  ADDENDUM REPORT: 10/10/2015 10:55 CLINICAL DATA:  Chest pain EXAM: Cardiac/Coronary  CT TECHNIQUE: The patient was scanned on a Philips 256 scanner. FINDINGS: A 120 kV prospective scan was triggered in the descending thoracic aorta at 111 HU's. Axial non-contrast 3 mm slices were carried out through the heart. The data set was analyzed on a dedicated work station and scored using the [place]. Gantry rotation speed was 270 msecs and collimation was .9 mm. 5 mg of iv Metoprolol and 0.8 mg of sl NTG was given. The 3D data set was reconstructed in 5% intervals of the 67-82 % of the R-R cycle. Diastolic phases were analyzed on a dedicated work station using MPR, MIP and VRT modes. The patient received 80 cc of contrast. Aorta:  Normal size, minimal calcifications, no dissection. Aortic Valve:  Trileaflet, minimal calcifications. Coronary Arteries: Normal origin.  Left dominance. Left main is a large artery that has mild noncalcified plaque in the proximal segment and mild calcified plaque in the distal portion with associated stenosis 25-50%. LAD is a large artery that has moderate diffuse mixed, predominantly calcified plaque in the proximal and mid segments with associated stenosis 50-69%. Distal LAD has only mild plaque. The first diagonal branch has only minimal plaque, the second diagonal branch has mild calcified ostial plaque. LCX artery has diffuse moderate predominantly calcified plaque in its mid portion with associated stenosis 50-69%. There is  a focal spot at the distal LCX artery suspicious of stenosis close to 70%. PLA and PDA have minimal plaque. RCA is a very small non-dominant artery with no obvious plaque. IMPRESSION: 1. Coronary calcium score of 980. This was 75 percentile for age  and sex matched control. 2. Normal coronary origin with left dominance. 3. Diffuse moderate plaque in the LAD and LCX arteries. An aggressive medical therapy is recommended. Ena Dawley Electronically Signed   By: Ena Dawley   On: 10/10/2015 10:55  10/10/2015  EXAM: OVER-READ INTERPRETATION  CT CHEST The following report is an over-read performed by radiologist Dr. Rebekah Chesterfield [facility] on 10/10/2015. This over-read does not include interpretation of cardiac or coronary anatomy or pathology. The coronary calcium score/coronary CTA interpretation by the cardiologist is attached. COMPARISON:  No priors. FINDINGS: In the medial aspect of the left lower lobe there is a 12 x 15 mm area of nodular architectural distortion (image 56 of series 304). This is somewhat similar in retrospect to remote prior study 06/09/2007, although slightly more bulky. 6 mm left upper lobe pulmonary nodule (image 29 of series 304). 3 mm left upper lobe nodule (image 39 of series 304). 11 x 7 mm nodule in the inferior aspect of the right upper lobe abutting the minor fissure (image 24 of series 304). No acute consolidative airspace disease. Trace bilateral pleural effusions lying dependently. Areas of ground-glass attenuation and mild interlobular septal thickening are noted throughout the lungs bilaterally, suggesting a background of mild interstitial pulmonary edema. No definite lymphadenopathy within the visualized thorax. Visualized portions of the upper abdomen are remarkable for atherosclerosis. There are no aggressive appearing lytic or blastic lesions noted in the visualized portions of the skeleton. IMPRESSION: 1. Multiple pulmonary nodules scattered throughout  the lungs bilaterally. Although the largest of these has been present on prior study dating back to 2008, it does appear slightly more bulky. This largest nodule is favored to represent an area of scarring, however, there are several other nodules that warrant attention on followup studies, and further evaluation with PET-CT is suggested at this time. 2. Evidence of mild interstitial pulmonary edema. 3. Trace bilateral pleural effusions bilaterally. These results will be called to the ordering clinician or representative by the Radiologist Assistant, and communication documented in the PACS or zVision Dashboard. Electronically Signed: By: Vinnie Langton M.D. On: 10/10/2015 09:57   Ir Angio Intra Extracran Sel Com Carotid Innominate Bilat Mod Sed  10/08/2015  CLINICAL DATA:  Left-sided weakness, leg greater than arm. Abnormal CT angiogram of the brain and neck. EXAM: BILATERAL COMMON CAROTID AND INNOMINATE ANGIOGRAPHY AND BILATERAL VERTEBRAL ARTERY ANGIOGRAMS PROCEDURE: Contrast:  60 ml 94mL OMNIPAQUE IOHEXOL 300 MG/ML  SOLN Anesthesia/Sedation:  Conscious sedation. Medications: Versed at 1 mg IV.  Fentanyl 25 mcg IV. Following a full explanation of the procedure along with the potential associated complications, an informed witnessed consent was obtained. The right groin was prepped and draped in the usual sterile fashion. Thereafter using modified Seldinger technique, transfemoral access into the right common femoral artery was obtained without difficulty. Over a 0.035 inch guidewire, a 5 French Pinnacle sheath was inserted. Through this, and also over 0.035 inch guidewire, a 5 [place] JB 1 catheter was advanced to the aortic arch region and selectively positioned in the right common carotid artery, the right subclavian artery, the left common carotid artery and the left subclavian artery. There were no acute complications. The patient tolerated the procedure well. FINDINGS: The right common carotid arteriogram  demonstrates the right external carotid artery to be mildly narrowed proximal. Its branches are normally opacified otherwise. The right internal carotid artery has severe pre occlusive stenosis on the lateral, likely a string sign with mild poststenotic dilatation. There is mild tortuosity  of the junction of the proximal 1/3 and middle 1/3 of the cervical right ICA. More distally, the vessel is seen to opacify normally to the cranial skull base. The petrous and the proximal cavernous segments are widely patent. There is an approximately 50% stenosis of the caval cavernous segment, with approximately 80-85% narrowing of the supraclinoid right ICA. The right middle cerebral artery distribution is seen to opacify into the capillary and the venous phases. Focal areas of caliber narrowing with irregularity represent intracranial arteriosclerotic disease. There is no angiographic visualization of the right anterior cerebral A1 segment. The right vertebral artery origin is patent. The vessel is seen to opacify to the cranial skull base where it opacifies the left posterior-inferior cerebellar artery and the left vertebrobasilar junction. Flow is seen into the distal basilar artery, the posterior cerebral arteries and superior cerebellar arteries. Extreme attenuation of the contrast in the proximal basilar artery is seen. The left vertebral artery origin is normal. The vessel is seen to opacify to the cranial skull base. There is an approximately 50% narrowing of the left vertebrobasilar junction just proximal to the left posterior-inferior cerebellar artery. High-grade 75 plus stenosis seen of the left vertebrobasilar junction just distal to the origin of the left posterior inferior cerebellar artery. Additionally there is approximately 70% narrowing of the proximal basilar artery. The visualized posterior cerebral arteries, superior cerebellar arteries and the anterior-inferior cerebellar arteries including the left  posterior inferior cerebellar artery demonstrated focal areas of caliber narrowing with irregularity most consistent with intracranial arteriosclerosis. The left common carotid arteriogram demonstrates the left external carotid artery and its major branches to be widely patent. The left internal carotid artery at the bulb demonstrates minimal plaque without associated ulcerations. Mild tortuosity is seen of the junction of the middle and the proximal 1/3 of the left internal carotid artery. More distally the vessel is seen to opacify to the cranial skull base. The petrous segment is widely patent. The cavernous segment demonstrates mild focal areas of caliber irregularity with approximately 50% narrowing of the distal cavernous segment. The supraclinoid segment appears widely patent. The left middle and the left anterior cerebral arteries are seen to opacify into the capillary and the venous phases. Scattered focal areas of caliber irregularity with narrowing suggest intracranial arteriosclerosis. Also seen is simultaneous opacification via the anterior communicating artery of the right anterior cerebral A2 segment and also an attenuated distal right A1 segment. There appears to be contrast leaking into the right middle cerebral artery at the proximal A1 segment. The right A2 segment demonstrates focal areas of caliber irregularity consistent with intracranial arteriosclerosis. 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 Approximately 85% narrowing of the right internal carotid artery supraclinoid segment, and 50% narrowing of the right internal carotid artery cavernous segment. Approximately 75% stenosis of the left vertebrobasilar junction just distal to the origin of the left posterior-inferior cerebellar artery, and approximately 70% narrowing of the proximal basilar artery. Approximately 50% narrowing of the left internal carotid artery  cavernous segment. 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 Electronically Signed   By: Luanne Bras M.D.   On: 10/08/2015 11:34   Ir Angio Vertebral Sel Subclavian Innominate Bilat Mod Sed  10/08/2015  CLINICAL DATA:  Left-sided weakness, leg greater than arm. Abnormal CT angiogram of the brain and neck. EXAM: BILATERAL COMMON CAROTID AND INNOMINATE ANGIOGRAPHY AND BILATERAL VERTEBRAL ARTERY ANGIOGRAMS PROCEDURE: Contrast:  60 ml 66mL OMNIPAQUE IOHEXOL 300 MG/ML  SOLN Anesthesia/Sedation:  Conscious sedation. Medications: Versed at 1 mg IV.  Fentanyl 25 mcg IV. Following a full explanation of the procedure along with the potential associated complications, an informed witnessed consent was obtained. The right groin was prepped and draped in the usual sterile fashion. Thereafter using modified Seldinger technique, transfemoral access into the right common femoral artery was obtained without difficulty. Over a 0.035 inch guidewire, a 5 French Pinnacle sheath was inserted. Through this, and also over 0.035 inch guidewire, a 5 [place] JB 1 catheter was advanced to the aortic arch region and selectively positioned in the right common carotid artery, the right subclavian artery, the left common carotid artery and the left subclavian artery. There were no acute complications. The patient tolerated the procedure well. FINDINGS: The right common carotid arteriogram demonstrates the right external carotid artery to be mildly narrowed proximal. Its branches are normally opacified otherwise. The right internal carotid artery has severe pre occlusive stenosis on the lateral, likely a string sign with mild poststenotic dilatation. There is mild tortuosity of the junction of the proximal 1/3 and middle 1/3 of the cervical right ICA. More distally, the vessel is seen to opacify normally to the cranial skull base. The petrous and the proximal cavernous segments are widely patent. There is an  approximately 50% stenosis of the caval cavernous segment, with approximately 80-85% narrowing of the supraclinoid right ICA. The right middle cerebral artery distribution is seen to opacify into the capillary and the venous phases. Focal areas of caliber narrowing with irregularity represent intracranial arteriosclerotic disease. There is no angiographic visualization of the right anterior cerebral A1 segment. The right vertebral artery origin is patent. The vessel is seen to opacify to the cranial skull base where it opacifies the left posterior-inferior cerebellar artery and the left vertebrobasilar junction. Flow is seen into the distal basilar artery, the posterior cerebral arteries and superior cerebellar arteries. Extreme attenuation of the contrast in the proximal basilar artery is seen. The left vertebral artery origin is normal. The vessel is seen to opacify to the cranial skull base. There is an approximately 50% narrowing of the left vertebrobasilar junction just proximal to the left posterior-inferior cerebellar artery. High-grade 75 plus stenosis seen of the left vertebrobasilar junction just distal to the origin of the left posterior inferior cerebellar artery. Additionally there is approximately 70% narrowing of the proximal basilar artery. The visualized posterior cerebral arteries, superior cerebellar arteries and the anterior-inferior cerebellar arteries including the left posterior inferior cerebellar artery demonstrated focal areas of caliber narrowing with irregularity most consistent with intracranial arteriosclerosis. The left common carotid arteriogram demonstrates the left external carotid artery and its major branches to be widely patent. The left internal carotid artery at the bulb demonstrates minimal plaque without associated ulcerations. Mild tortuosity is seen of the junction of the middle and the proximal 1/3 of the left internal carotid artery. More distally the vessel is seen to  opacify to the cranial skull base. The petrous segment is widely patent. The cavernous segment demonstrates mild focal areas of caliber irregularity with approximately 50% narrowing of the distal cavernous segment. The supraclinoid segment appears widely patent. The left middle and the left anterior cerebral arteries are seen to opacify into the capillary and the venous phases. Scattered focal areas of caliber irregularity with narrowing suggest intracranial arteriosclerosis. Also seen is simultaneous opacification via the anterior communicating artery of the right anterior cerebral A2 segment and also an attenuated distal right A1 segment. There appears to be contrast  leaking into the right middle cerebral artery at the proximal A1 segment. The right A2 segment demonstrates focal areas of caliber irregularity consistent with intracranial arteriosclerosis. 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 Approximately 85% narrowing of the right internal carotid artery supraclinoid segment, and 50% narrowing of the right internal carotid artery cavernous segment. Approximately 75% stenosis of the left vertebrobasilar junction just distal to the origin of the left posterior-inferior cerebellar artery, and approximately 70% narrowing of the proximal basilar artery. Approximately 50% narrowing of the left internal carotid artery cavernous segment. 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 Electronically Signed   By: Luanne Bras M.D.   On: 10/08/2015 11:34    ASSESSMENT/PLAN:  1. 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 S/P right CEA POD #1.  2. 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  Coronary calcium score 980 (81% for age) with diffuse moderate plaque  in LAD and LCX with small non dominant RCA with no plaque.  Needs aggressive risk factor modification.  HbgA1C mildly elevated so needs followup with PCP as outpt.  LDL markedly elevated despite high dose statin.  Zetia added this admission.  Needs aggressive BP management.  Restart Imdur if ok with surgery and Neuro.  Continue ASA/statin/BB. Need to d/c Cialis since he is on long acting nitrates at home.   3. HTN - poorly controlled. Increase Coreg to 6.25mg  BID.  Continue amlodipine and consider restarting home dose of ACE I.  4. Dyslipidemia - LDL poorly controlled at 206 despite high dose statin.  ? Whether he has been taking his statin at home.Zetia 10mg  daily added.  Will get FLP in 8 weeks and if still elevated then refer to lipid clinic for consideration of [facility] 9 drug.   5. PAF - he was not on longterm anticoagulation at home for unclear reasons. This patients CHA2DS2-VASc Score and unadjusted Ischemic Stroke Rate (% per year) is equal to 9.8 % stroke rate/year from a score of 6. Above score calculated as 1 point each if present [CHF, HTN, DM, Vascular=MI/PAD/Aortic Plaque, Age if 65-74, or Male]. Above score calculated as 2 points each if present [Age > 75, or Stroke/TIA/TE]. He should be placed on NOAC post op for cardioembolic prevention going forward.     Sueanne Margarita, MD  10/11/2015  9:47 AM

## 2015-10-11 NOTE — Progress Notes (Signed)
At 1512 patient pulled the call cord in the bathroom.  Upon entry into the bathroom patient was found slumped against the wall with the call cord wrapped around his fingers.  Patient did not make eye contact and did not follow any commands.  Patient was observed drooling and non-verbal.  Assistance obtained from two RNs in the nursing station. Rapid response was called. Patient was observed with slight body "jerking".  Patient was not able to stand, he could not support his weight and was very lethargic.  A wheelchair was obtained and patient was wheeled back to the bed and patient placed in the bed by 3 staff RNs.  Patient reconnected to monitors.  Rapid response RN, Forbes Cellar, performed an NIH stroke assessment once patient back to bed.  All motor function had returned and patient was same status as prior to event, with exception of slurred speech.  Dr. Kellie Simmering notified of events.  He is requesting that head CT scan results be called to Dr. Creig Hines.  Dr. Clementeen Graham notified of situation via text message.  Code Stroke Team activated by Rapid Response RN.  Dr. Clementeen Graham to bedside.  Patient transported to CT by RRT RNs.

## 2015-10-11 NOTE — Progress Notes (Signed)
Dr. Geryl Councilman at bedside.  Complete report of afternoon events provided.  Patient's friend, [name], at bedside.  Patient gave verbal consent to discuss afternoon events while [name] was present in room.

## 2015-10-11 NOTE — Progress Notes (Signed)
   Daily Progress Note  Pt reported had an "event" while on toilet.  Unclear if TIA vs seizures vs syncopal event.  Code stroke activated.  Work-up in progress.  Head CT unremarkable.  Head MRI pending.  R carotid duplex pending.  Pt currently back to baseline neuro status.  Exam is symmetric with appropriate strength and intact orientation.  Minimal hematoma in right neck incision.   - pending rest of Stroke work-up - stat R carotid duplex ordered: suspect will be normal  Adele Barthel, MD [facility] Office: [phone] Pager: [phone]  10/11/2015, 5:13 PM

## 2015-10-11 NOTE — Progress Notes (Signed)
Last known time of "normal" was at 1453 when I gave eliquis while patient was on toilet attempting to void.

## 2015-10-11 NOTE — Code Documentation (Signed)
Patient LKW 1453,  RN responded to call light in bathroom at 1512 and found patient slumped against the wall and he was non verbal.  When they attempted to stand he was unable to stand.  RN assisted him back to bed with wheelchair.  Upon my arrival patient was sitting upright on the side of the bed, with some nausea and dry heaving.  Patient laid flat in the bed.  NIHSS 1 for slurred speech.  No drift in any extremity.  Code Stroke Called at 1532.  Stroke team at bedside, inpatient code stroke orders placed.  Dr Clementeen Graham and Dr Kellie Simmering notified.  Dr Clementeen Graham at bedside.  Per bedside RN patient with clear speech this am.  Stat head CT done.  Patient continues to have slurred speech.   Patient falls asleep but arouses easily when spoken to.  RN notifying Dr Bridgett Larsson.  RN to call if assistance needed.

## 2015-10-11 NOTE — Progress Notes (Signed)
ANTICOAGULATION CONSULT NOTE - Initial Consult  Pharmacy Consult for Apixaban Indication: atrial fibrillation and stroke  No Known Allergies  Patient Measurements: Height: 5\' 6"  (167.6 cm) Weight: 172 lb 9.6 oz (78.291 kg) IBW/kg (Calculated) : 63.8  Vital Signs: Temp: 97.7 F (36.5 C) (03/10 0814) Temp Source: Oral (03/10 0814) BP: 251/109 mmHg (03/10 1300) Pulse Rate: 113 (03/10 1300)  Labs:  Recent Labs  10/10/15 0552 10/11/15 0400  HGB 11.4* 10.1*  HCT 35.9* 32.0*  PLT 165 164  CREATININE 1.11 1.08    Estimated Creatinine Clearance: 60 mL/min (by C-G formula based on Cr of 1.08).   Medical History: Past Medical History  Diagnosis Date  . Hypertension     echocardiogram 6/10: Moderate LVH, EF 55-65%, mild, mild MR, MAC, mild LAE, PASP 32  . Dyslipidemia   . History of stroke   . CAD (coronary artery disease)     cath in 1/08: EF 60%, mild dilated Ao root, oD2 30%, LAD 20-30%.  . Paroxysmal atrial fibrillation ([place])   . Anemia, iron deficiency 08/15/2011  . History of pneumonia 08/15/2011  . History of MI (myocardial infarction) 08/15/2011  . 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 Stroke ([place]) 10/2011  . Myocardial infarction ([place])   . Hyperlipidemia   . Shortness of breath dyspnea   . Prediabetes 10/08/2015  . Preoperative cardiovascular examination 10/09/2015    Medications:  Scheduled:  . amLODipine  10 mg Oral Daily  . aspirin  81 mg Oral Daily  . atorvastatin  80 mg Oral Daily  . carvedilol  3.125 mg Oral BID WC  . docusate sodium  100 mg Oral Daily  . [START ON 10/12/2015] enoxaparin (LOVENOX) injection  40 mg Subcutaneous Daily  . ezetimibe  10 mg Oral Daily  . furosemide  20 mg Oral Daily  . pantoprazole  40 mg Oral Daily  . sodium chloride flush  3 mL Intravenous Q12H  . THROMBI-PAD  1 each Topical Once   Infusions:  . sodium chloride Stopped (10/11/15 0700)  . lactated ringers 10 mL/hr at 10/10/15 1203    Assessment: 75 yo M admitted with  L leg weakness and numbness and found to have new CVA.  Pt has a hx of afib and CVA but was not on anticoagulation PTA due to a hx of anemia.  Cards recommending starting anticoagulation based on CHADSVasc = 5.  Goal of Therapy:  Therapeutic Anticoagulation Monitor platelets by anticoagulation protocol: Yes   Plan:  Apixaban 5mg  PO BID Monitor for s/sx bleeding  [employer], Pharm.D., BCPS Clinical Pharmacist Pager [phone] 10/11/2015 1:47 PM

## 2015-10-11 NOTE — Progress Notes (Signed)
Patient assisted to bathroom to void.  Pt unable to void.  Assisted back to bed.

## 2015-10-11 NOTE — Progress Notes (Signed)
RRT called as patient found to be confused wth slurred speech in the bathroom, not responding and leaning to the wall about to fall. Was brought to the bed. Found to hypertensive with BP 251/109. Upon my evaluation 10 minutes alter, his speech seemed at baseline. Pt not sure what happened, but told me he had to strain a lot to urinate.  Not a candidate for  tPA as pt on eliquis. Earlier today I/O catheterization was done for urinary retention, now has 400 cc on bladder scan. Reports symptoms of BPH. suspect symptoms to be vasovagal. Vitals reviewed. No new neurological deficit on my exam.  plan on code stroke w/up with stat Head CT followed by MRI brain. Neurology as bedside and appreciate recommendations. Ordered to insert foley and added flomax.  continue stepdown monitoring and close neurochecks.

## 2015-10-11 NOTE — Progress Notes (Signed)
STROKE TEAM PROGRESS NOTE   SUBJECTIVE (INTERVAL HISTORY)  Patient did well with carotid endarterectomy yesterday. He is sitting up in a bedside chair. He has no complaints  OBJECTIVE Temp:  [97.2 F (36.2 C)-98.5 F (36.9 C)] 97.7 F (36.5 C) (03/10 0814) Pulse Rate:  [66-158] 89 (03/10 0435) Cardiac Rhythm:  [-] Normal sinus rhythm (03/10 0817) Resp:  [10-33] 13 (03/10 0435) BP: (137-226)/(69-182) 191/90 mmHg (03/10 0952) SpO2:  [94 %-100 %] 100 % (03/10 0814) Arterial Line BP: (107-208)/(66-136) 107/101 mmHg (03/10 0205) Weight:  [172 lb 9.6 oz (78.291 kg)] 172 lb 9.6 oz (78.291 kg) (03/09 1819)  CBC:   Recent Labs Lab 10/06/15 1542  10/10/15 0552 10/11/15 0400  WBC 4.5  < > 5.8 8.7  NEUTROABS 2.9  --   --   --   HGB 12.6*  < > 11.4* 10.1*  HCT 39.8  < > 35.9* 32.0*  MCV 68.4*  < > 68.0* 67.8*  PLT 181  < > 165 164  < > = values in this interval not displayed.  Basic Metabolic Panel:   Recent Labs Lab 10/10/15 0552 10/11/15 0400  NA 139 136  K 3.7 4.0  CL 107 106  CO2 22 23  GLUCOSE 95 113*  BUN 19 15  CREATININE 1.11 1.08  CALCIUM 8.7* 8.1*    Lipid Panel:     Component Value Date/Time   CHOL 262* 10/07/2015 1114   TRIG 118 10/07/2015 1114   HDL 32* 10/07/2015 1114   CHOLHDL 8.2 10/07/2015 1114   VLDL 24 10/07/2015 1114   LDLCALC 206* 10/07/2015 1114   HgbA1c:  Lab Results  Component Value Date   HGBA1C 6.2* 10/07/2015   Urine Drug Screen:     Component Value Date/Time   LABOPIA NONE DETECTED 10/09/2015 1550   LABOPIA NEGATIVE 01/01/2009 0600   COCAINSCRNUR NONE DETECTED 10/09/2015 1550   COCAINSCRNUR NEGATIVE 01/01/2009 0600   LABBENZ POSITIVE* 10/09/2015 1550   LABBENZ NEGATIVE 01/01/2009 0600   AMPHETMU NONE DETECTED 10/09/2015 1550   AMPHETMU NEGATIVE 01/01/2009 0600   THCU NONE DETECTED 10/09/2015 1550   LABBARB NONE DETECTED 10/09/2015 1550      IMAGING  Ct Head Wo Contrast 10/06/2015  1.  No acute intracranial abnormality. 2.   Cerebral atrophy and small vessel ischemic change. 3. Sinus disease.    Ct Angio Head & Neck W/cm &/or Wo/cm 10/07/2015  1. Radiographic String Sign Stenosis at the right ICA origin related to Low-density plaque or thrombus, however, there is superimposed severe intracranial atherosclerosis including high-grade distal right ICA stenosis in part due to bulky calcified plaque. 2. Widespread intracranial moderate to severe first and second order vessel irregularity and stenosis, including severe stenoses at the: -bilateral distal vertebral arteries, - proximal basilar artery, - bilateral PCAs, - right ACA A1, - bilateral ACA distal A2 segments. 3. No intracranial hemorrhage or mass effect.   Mr Brain Wo Contrast 10/06/2015  1. Patchy acute ischemic nonhemorrhagic infarcts involving the cortical gray matter and deep white matter of the right frontal, right parietal, and right occipital lobes. No associated mass effect. 2. Subtle abnormal flow void within the distal right ICA, which may reflect underlying atheromatous disease, slow flow, and/or partial occlusion. 3. Generalized cerebral atrophy with moderate chronic small vessel ischemic disease. 4. Mild paranasal sinus disease as above.   2D Echocardiogram  - Left ventricle: The cavity size was normal. Wall thickness wasincreased in a pattern of severe LVH. Systolic function wasnormal. The estimated ejection  fraction was in the range of 55%to 60%. Left ventricular diastolic function parameters werenormal. - Aortic valve: There was mild regurgitation. - Mitral valve: There was mild regurgitation. - Left atrium: The atrium was moderately dilated.  Cerebral Angiogram 10/08/2015 1.Preocclusive stenosis of RT ICA prox . 2.Approx 80 % stenosis of RT ICA supraclinoid seg and 50 % of cavernous seg. 3.Occluded RT ACA A1 seg. 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 5.difuse mod to mod severe ICAD   PHYSICAL EXAM Pleasant  elderly Caucasian male currently not in distress. . Afebrile. Head is nontraumatic. Neck is supple without bruit.    Cardiac exam no murmur or gallop. Lungs are clear to auscultation. Distal pulses are well felt. Carotid endarterectomy dressing on the neck on the right Neurological Exam :  Awake alert oriented x 3 normal speech and language. Mild left lower face asymmetry. Tongue midline. No drift. Mild diminished fine finger movements on left. Orbits right over left upper extremity. Mild left grip weak.. Normal sensation . Normal coordination.   ASSESSMENT/PLAN [name] is a 75 y.o. male with history of hypertension, stroke, myocardial infarction, atrial fibrillation presenting with left leg weakness. He did not receive IV t-PA due to delay in arrival.   Stroke:  patchy right frontal, right parietal and right occipital infarcts, likely secondary to large vessel disease source  MRI  patchy right frontal, right parietal and right occipital infarcts  CTA angio head and neck right ICA origin string sign with superimposed severe intracranial atherosclerosis including high-grade right ICA stenosis due to bulky calcified plaque.  2D Echo  No source of embolus   Cerebral angiogram R supraclinoid ICA 90%, cavernous 50%. Occluded R A1. 70% L VBJ/prox BA  LDL 206  HgbA1c 6.2  Lovenox 40 mg sq daily for VTE prophylaxis Diet Heart Room service appropriate?: Yes; Fluid consistency:: Thin  aspirin 81 mg daily prior to admission, now on aspirin 325 mg daily  Patient counseled to be compliant with his antithrombotic medications  Ongoing aggressive stroke risk factor management  Therapy recommendations:  pending   Disposition:  pending   Intra and Extracranial stenoses  Cerebral angiogram R supraclinoid ICA 90%, cavernous 50%. Occluded R A1. 70% L VBJ/prox BA  Dr. Leonie Man has contacted [facility]  Consideration of surgery/stent (Dr. Trula Slade)  Atrial Fibrillation  Home anticoagulation:   aspirin 81 mg daily   In note from 2013 stated not on anticoagulation d/t anemia, colonscopy with internal hemorrhoids  11/2011 - subacute stroke found on evaluation of dizziness - pt with afib. Coumadin recommended Seen by neurology (reynolds)   Hypertension  Elevated, but acceptable Permissive hypertension (OK if < 220/120) but gradually normalize in 5-7 days  Hyperlipidemia  Home meds:  Lipitor 80, resumed in hospital  LDL 206, goal < 70  Continue statin at discharge  Other Stroke Risk Factors  Advanced age  Hx stroke/TIA  11/2011 - subacute stroke found on evaluation of dizziness. Coumadin recommended Seen by neurology (reynolds)  Coronary artery disease  Other Active Problems  Chronic kidney disease stage III  BPH  Hospital day # [place] for Pager information 10/11/2015 11:03 AM  I have personally examined this patient, reviewed notes, independently viewed imaging studies, participated in medical decision making and plan of care. I have made any additions or clarifications directly to the above note. Agree with note above he had successful right carotid endarterectomy yesterday but still  remains at risk for neurological worsening, recurrent stroke and TIA due to his intracranial stenosis. Recommend start aspirin today and add Plavix for 3 months at the time of discharge. Continue aggressive risk factor modification. Follow-up as an outpatient in stroke clinic in 2 months. Discussed with vascular surgery physician assistant. Stroke team will sign off. Call for questions Antony Contras, MD Medical Director [place] Pager: [phone] 10/11/2015 11:03 AM   To contact Stroke Continuity provider, please refer to [email or web address] After hours, contact General Neurology

## 2015-10-11 NOTE — Evaluation (Signed)
Physical Therapy Evaluation Patient Details Name: [name] MRN: [id number] DOB: November 15, 1940 Today's Date: 10/11/2015   History of Present Illness  Pt is a 75 yo male admitted with L leg weakness and numbness.  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  Pt had RCEA.  Pt with prior h/o CVA and MI.    Clinical Impression  Pt admitted with above diagnosis. Pt currently with functional limitations due to the deficits listed below (see PT Problem List). [name] was living alone and Ind PTA.  He currently requires min assist to steady w/ transfers and ambulation due to Lt sided weakness and ataxia.  He has great potential to reach mod I level of independence.  Pt to have conversation w/ family about assist available at d/c.  Pt may have option to stay w/ his brother in [place] at d/c. Pt will benefit from skilled PT to increase their independence and safety with mobility to allow discharge to the venue listed below.      Follow Up Recommendations CIR    Equipment Recommendations  Rolling walker with 5" wheels    Recommendations for Other Services Rehab consult     Precautions / Restrictions Precautions Precautions: Fall Restrictions Weight Bearing Restrictions: No Other Position/Activity Restrictions: Pt states it is uncomfortable to turn head to the right, RN notified      Mobility  Bed Mobility Overal bed mobility: Needs Assistance Bed Mobility: Supine to Sit     Supine to sit: Min guard;HOB elevated     General bed mobility comments: Use of bed rail and increased time. Cues to scoot to EOB for improved balance w/ feet on floor.  Transfers Overall transfer level: Needs assistance Equipment used: 1 person hand held assist Transfers: Sit to/from Stand Sit to Stand: Min assist Stand pivot transfers: Min assist       General transfer comment: Pt requires min assist to steady due to weakness and ataxia in Lt LE.  Cues to  stand upright.  Ambulation/Gait Ambulation/Gait assistance: Min assist Ambulation Distance (Feet): 100 Feet Assistive device: 1 person hand held assist Gait Pattern/deviations: Ataxic;Trunk flexed;Staggering left;Staggering right   Gait velocity interpretation: Below normal speed for age/gender General Gait Details: Lt LE ataxia and pt reached out for armrail and bed in hallway to steady.  Min assist to steady as pt staggers Lt and Rt.  Cues for upright posture as pt looking down at feet.  Stairs            Wheelchair Mobility    Modified Rankin (Stroke Patients Only) Modified Rankin (Stroke Patients Only) Pre-Morbid Rankin Score: No symptoms Modified Rankin: Moderately severe disability (assist w/ ambulation)     Balance Overall balance assessment: Needs assistance Sitting-balance support: Feet supported;No upper extremity supported Sitting balance-Leahy Scale: Good     Standing balance support: Single extremity supported;During functional activity Standing balance-Leahy Scale: Poor Standing balance comment: Min assist to steady                              Pertinent Vitals/Pain Pain Assessment: Faces Faces Pain Scale: Hurts a little bit Pain Location: Rt neck/side of head Pain Descriptors / Indicators: Tightness;Sore;Pressure;Burning Pain Intervention(s): Limited activity within patient's tolerance;Monitored during session    Home Living Family/patient expects to be discharged to:: Private residence Living Arrangements: Alone Available Help at Discharge: Friend(s);Family (may be able to stay with brother if needed?)  Type of Home: House Home Access: Stairs to enter Entrance Stairs-Rails: Left Entrance Stairs-Number of Steps: 4 Home Layout: One level Home Equipment: None Additional Comments: Might stay at brother's house at d/c, he lives in [place].  Family visiting pt today and pt will begin converasation with family about assist avaialble at d/c  and where he might go.    Prior Function Level of Independence: Independent         Comments: Pt drives, cooks, cleans etc.     Hand Dominance   Dominant Hand: Right    Extremity/Trunk Assessment   Upper Extremity Assessment: Defer to OT evaluation       LUE Deficits / Details: LUE functional but not as strong as RUE.  4/5 strength throughout.   Lower Extremity Assessment: LLE deficits/detail   LLE Deficits / Details: DF, knee extension: 4/5; hip flexion, knee flexion: 4-/5  Cervical / Trunk Assessment: Other exceptions  Communication   Communication: Other (comment) (words seemed slurred at times.  Friend states it is baseline)  Cognition Arousal/Alertness: Awake/alert Behavior During Therapy: WFL for tasks assessed/performed Overall Cognitive Status: Impaired/Different from baseline Area of Impairment: Memory;Problem solving     Memory: Decreased short-term memory       Problem Solving: Slow processing General Comments: Overall, pt performed well with basic safety questions etc. Pt just appears a little "off" when answering questions.  Pt had difficulty answering problem solving questions directly but eventually would come to correct answer with cues.    General Comments General comments (skin integrity, edema, etc.): Pt wit swelling in R side of neck.    Exercises General Exercises - Lower Extremity Ankle Circles/Pumps: AROM;Both;10 reps;Seated      Assessment/Plan    PT Assessment Patient needs continued PT services  PT Diagnosis Difficulty walking;Abnormality of gait   PT Problem List Decreased strength;Decreased activity tolerance;Decreased balance;Decreased coordination;Decreased cognition;Decreased knowledge of use of DME;Decreased safety awareness;Impaired sensation  PT Treatment Interventions DME instruction;Gait training;Stair training;Functional mobility training;Therapeutic activities;Therapeutic exercise;Balance training;Neuromuscular  re-education;Cognitive remediation;Patient/family education   PT Goals (Current goals can be found in the Care Plan section) Acute Rehab PT Goals Patient Stated Goal: to be well again. PT Goal Formulation: With patient Time For Goal Achievement: 10/25/15 Potential to Achieve Goals: Good    Frequency Min 4X/week   Barriers to discharge Decreased caregiver support;Inaccessible home environment lives alone and has steps to enter home    Co-evaluation               End of Session Equipment Utilized During Treatment: Gait belt Activity Tolerance: Patient tolerated treatment well Patient left: in chair;with call bell/phone within reach;with family/visitor present;with chair alarm set;Other (comment) (w/ OT) Nurse Communication: Mobility status;Other (comment) (pt's c/o tightness and burning in Rt neck and head)         Time: [id number] PT Time Calculation (min) (ACUTE ONLY): 37 min   Charges:   PT Evaluation $PT Eval Moderate Complexity: 1 Procedure PT Treatments $Gait Training: 8-22 mins   PT G Codes:       Collie Siad PT, DPT  Pager: [phone] Phone: [phone] 10/11/2015, 10:22 AM

## 2015-10-11 NOTE — Progress Notes (Signed)
Tongue midline, equal lateral movement.  PAs for surgeons at bedside.  Slight increase in size of upper incision line noted; area soft.  PAs aware.

## 2015-10-11 NOTE — Progress Notes (Addendum)
  Progress Note    10/11/2015 8:47 AM 1 Day Post-Op  Subjective:  No complaints  afebrile HR 80's-90's NSR   Filed Vitals:   10/11/15 0435 10/11/15 0814  BP: 178/91 174/95  Pulse: 89   Temp:  97.7 F (36.5 C)  Resp: 13      Physical Exam: Neuro:  In tact; moving all extremities equally; tongue is midline; smile symmetrical Lungs:  Non labored Incision:  Clean and dry with moderate size hematoma-soft  CBC    Component Value Date/Time   WBC 8.7 10/11/2015 0400   RBC 4.72 10/11/2015 0400   HGB 10.1* 10/11/2015 0400   HCT 32.0* 10/11/2015 0400   PLT 164 10/11/2015 0400   MCV 67.8* 10/11/2015 0400   MCH 21.4* 10/11/2015 0400   MCHC 31.6 10/11/2015 0400   RDW 15.9* 10/11/2015 0400   LYMPHSABS 1.4 10/06/2015 1542   MONOABS 0.2 10/06/2015 1542   EOSABS 0.0 10/06/2015 1542   BASOSABS 0.0 10/06/2015 1542    BMET    Component Value Date/Time   NA 136 10/11/2015 0400   K 4.0 10/11/2015 0400   CL 106 10/11/2015 0400   CO2 23 10/11/2015 0400   GLUCOSE 113* 10/11/2015 0400   BUN 15 10/11/2015 0400   CREATININE 1.08 10/11/2015 0400   CALCIUM 8.1* 10/11/2015 0400   GFRNONAA >60 10/11/2015 0400   GFRAA >60 10/11/2015 0400     Intake/Output Summary (Last 24 hours) at 10/11/15 0847 Last data filed at 10/11/15 0600  Gross per 24 hour  Intake   2330 ml  Output   1425 ml  Net    905 ml     Assessment/Plan:  This is a 75 y.o. male who is s/p right CEA 1 Day Post-Op  -pt is doing well this am. -pt neuro exam is in tact; moving all extremities equally -pt does have moderate hematoma right neck; soft, pt is not having any difficulty breathing or swallowing.   He is scheduled for aspirin 325mg  this morning-will hold on this for now.  Discussed with Dr. Leonie Man this morning about Plavix.  Will be okay to be discharged with this, but hold on starting for now.  Hold Lovenox until tomorrow. Systolic BP > [id number] yesterday after surgery and this could have resulted in neck hematoma.   170's this am. -check CBC in am -pt getting ready to work with PT now -pt has not voided-his foley was removed this morning at 4am; unable to void this morning-continue to monitor.  May need foley placed if unable to void. -f/u with Dr. Trula Slade in 2 weeks.   Leontine Locket, PA-C [facility] [phone] Agree with above assessment Neurologic exam unchanged from preop with very mild weakness in left lower extremity-no change Right neck incision healing satisfactorily with mild hematoma Patient has very short neck in am not concerned at this point about the hematoma  Patient has been unable to void since removal of Foley at 4 AM. If unable to void by noon today we'll do an in out cath and if unable to void post this maneuver will then reinsert Foley catheter later this evening  Continue physical therapy and resume aspirin therapy today but hold Plavix and Lovenox Generally patient doing quite well from a vascular standpoint

## 2015-10-11 NOTE — Care Management Important Message (Signed)
Important Message  Patient Details  Name: [name] MRN: [id number] Date of Birth: 1940/08/15   Medicare Important Message Given:  Yes    Nathen May 10/11/2015, 11:42 AM

## 2015-10-11 NOTE — Progress Notes (Signed)
Rehab admissions - Please see rehab consult done today recommending CIR.  Patient has [employer].  We will need authorization if patient comes to inpatient rehab.  I will follow progress over weekend and check back on Monday for potential inpatient rehab admission.  Call me for questions.  [phone]

## 2015-10-11 NOTE — Progress Notes (Signed)
Per Dr. Kellie Simmering this morning in rounds, if patient was not able to void by 11:30, I&O cath x once.  Then if unable to void by 8:30 pm this evening, reinsert the foley catheter.  Using strict sterile technique, patient was I&O cathed.  457ml of clear amber urine obtained.  There was slight resistance, but with time and deep breathing by patient, cath passed and was able to empty bladder.  Patient tolerated well.

## 2015-10-11 NOTE — Anesthesia Postprocedure Evaluation (Signed)
Anesthesia Post Note  Patient: [name]  Procedure(s) Performed: Procedure(s) (LRB): ENDARTERECTOMY CAROTID-RIGHT (Right)  Patient location during evaluation: PACU Anesthesia Type: General Level of consciousness: sedated and patient cooperative Pain management: pain level controlled Vital Signs Assessment: post-procedure vital signs reviewed and stable Respiratory status: spontaneous breathing Cardiovascular status: stable Anesthetic complications: no Comments: Pt severely hypertensive in PACU requiring aggressive antihypertensive management. Discussed with Dr. Trula Slade possible nitropursside gtt. He requested nitroglycerine. Prior to starting I had his bladder scanned. He had some retention. Foley was placed and BP and HR rapidly approached, albeit high, his normal range.    Last Vitals:  Filed Vitals:   10/11/15 0814 10/11/15 0952  BP: 174/95 191/90  Pulse:    Temp: 36.5 C   Resp:      Last Pain:  Filed Vitals:   10/11/15 0952  PainSc: 0-No pain                 Nolon Nations

## 2015-10-11 NOTE — Care Management Note (Addendum)
Case Management Note  Patient Details  Name: [name] MRN: [id number] Date of Birth: Oct 11, 1940  Subjective/Objective:     Date: 10/11/15 Spoke with patient at the bedside, NCM asked if it was ok to talk while [name] was in the room, patient gave me permission to ask him questions.  Introduced self as Tourist information centre manager and explained role in discharge planning and how to be reached.  Verified patient lives in town, alone, was fairly indep pta.  Verified patient anticipates to go CIR/ SNF needed for backup at time of discharge.  Patient denied needing help with their medication.  Patient drives  to MD appointments, but will likely need friend or family to assist him.  Verified patient has PCP Cathlean Cower.   Plan: CM will continue to follow for discharge planning and [facility] resources.                Action/Plan: CVA  3/7  S/p CEA  3/9  Expected Discharge Date:  10/10/15               Expected Discharge Plan:  [place]  In-House Referral:     Discharge planning Services  CM Consult  Post Acute Care Choice:    Choice offered to:     DME Arranged:    DME Agency:     HH Arranged:    HH Agency:     Status of Service:  In process, will continue to follow  Medicare Important Message Given:  Yes Date Medicare IM Given:    Medicare IM give by:    Date Additional Medicare IM Given:    Additional Medicare Important Message give by:     If discussed at [place] of Stay Meetings, dates discussed:    Additional Comments:  Zenon Mayo, RN 10/11/2015, 4:55 PM

## 2015-10-11 NOTE — Consult Note (Signed)
Requesting Physician: Dr. Clementeen Graham    Chief Complaint: Code stroke  History obtained from:  Patient     HPI:                                                                                                                                         [name] is an 75 y.o. male followed by stroke team. Was in room today when trying to urinate and noted he was nauseated and then slumped over. He did not lose coniousness and then returned to baseline. Patient had CEA yesterday.  Now back to baseline. CT scan of his head showed no acute findings.  Date last known well: 10/11/2015 Time last known well: Time: 15:00 tPA Given: No: on AC and recent surgery, symptoms resolved.      Past Medical History  Diagnosis Date  . Hypertension     echocardiogram 6/10: Moderate LVH, EF 55-65%, mild, mild MR, MAC, mild LAE, PASP 32  . Dyslipidemia   . History of stroke   . CAD (coronary artery disease)     cath in 1/08: EF 60%, mild dilated Ao root, oD2 30%, LAD 20-30%.  . Paroxysmal atrial fibrillation ([place])   . Anemia, iron deficiency 08/15/2011  . History of pneumonia 08/15/2011  . History of MI (myocardial infarction) 08/15/2011  . Dilated aortic root ([place]) 08/15/2011  . Stroke ([place]) 10/2011  . Myocardial infarction ([place])   . Hyperlipidemia   . Shortness of breath dyspnea   . Prediabetes 10/08/2015  . Preoperative cardiovascular examination 10/09/2015    Past Surgical History  Procedure Laterality Date  . Cardiac catheterization    . Endarterectomy Right 10/10/2015    Procedure: ENDARTERECTOMY CAROTID-RIGHT;  Surgeon: Serafina Mitchell, MD;  Location: [facility] OR;  Service: Vascular;  Laterality: Right;    Family History  Problem Relation Age of Onset  . Colon cancer Neg Hx   . Aneurysm Sister    Social History:  reports that he has never smoked. He has never used smokeless tobacco. He reports that he does not drink alcohol or use illicit drugs.  Allergies: No Known Allergies  Medications:                                                                                                                            Prior to Admission:  Prescriptions prior to  admission  Medication Sig Dispense Refill Last Dose  . albuterol (PROVENTIL HFA;VENTOLIN HFA) 108 (90 BASE) MCG/ACT inhaler Inhale 2 puffs into the lungs every 6 (six) hours as needed for wheezing or shortness of breath. (Patient not taking: Reported on 10/07/2015) 1 Inhaler 2   . amitriptyline (ELAVIL) 50 MG tablet Take 1 tablet (50 mg total) by mouth at bedtime as needed for sleep. (Patient not taking: Reported on 01/21/2015) 90 tablet 1 Not Taking at Unknown time  . amLODipine (NORVASC) 10 MG tablet Take 1 tablet (10 mg total) by mouth daily. (Patient not taking: Reported on 06/19/2015) 30 tablet 0 Not Taking at Unknown time  . aspirin EC 81 MG tablet Take 1 tablet (81 mg total) by mouth daily. (Patient not taking: Reported on 10/07/2015) 30 tablet 0   . atorvastatin (LIPITOR) 80 MG tablet Take 1 tablet (80 mg total) by mouth daily. (Patient not taking: Reported on 01/21/2015) 90 tablet 3 Not Taking at Unknown time  . carvedilol (COREG) 3.125 MG tablet Take 1 tablet (3.125 mg total) by mouth 2 (two) times daily with a meal. (Patient not taking: Reported on 10/07/2015) 60 tablet 0   . furosemide (LASIX) 20 MG tablet Take 1 tablet (20 mg total) by mouth daily. (Patient not taking: Reported on 10/07/2015) 30 tablet 0   . isosorbide mononitrate (IMDUR) 30 MG 24 hr tablet Take 1 tablet (30 mg total) by mouth daily. (Patient not taking: Reported on 10/07/2015) 30 tablet 0   . lisinopril (PRINIVIL,ZESTRIL) 5 MG tablet Take 1 tablet (5 mg total) by mouth daily. (Patient not taking: Reported on 10/07/2015) 30 tablet 0   . tadalafil (CIALIS) 20 MG tablet Take 1 tablet (20 mg total) by mouth daily as needed for erectile dysfunction. (Patient not taking: Reported on 01/21/2015) 5 tablet 11 Not Taking at Unknown time   Scheduled: . amLODipine  10 mg Oral Daily  .  apixaban  5 mg Oral BID  . aspirin  81 mg Oral Daily  . atorvastatin  80 mg Oral Daily  . carvedilol  3.125 mg Oral BID WC  . docusate sodium  100 mg Oral Daily  . ezetimibe  10 mg Oral Daily  . furosemide  20 mg Oral Daily  . pantoprazole  40 mg Oral Daily  . sodium chloride flush  3 mL Intravenous Q12H  . tamsulosin  0.4 mg Oral QPC supper  . THROMBI-PAD  1 each Topical Once    ROS:                                                                                                                                       History obtained from the patient  General ROS: negative for - chills, fatigue, fever, night sweats, weight gain or weight loss Psychological ROS: negative for - behavioral disorder, hallucinations, memory difficulties, mood swings or suicidal ideation Ophthalmic ROS: negative for - blurry  vision, double vision, eye pain or loss of vision ENT ROS: negative for - epistaxis, nasal discharge, oral lesions, sore throat, tinnitus or vertigo Allergy and Immunology ROS: negative for - hives or itchy/watery eyes Hematological and Lymphatic ROS: negative for - bleeding problems, bruising or swollen lymph nodes Endocrine ROS: negative for - galactorrhea, hair pattern changes, polydipsia/polyuria or temperature intolerance Respiratory ROS: negative for - cough, hemoptysis, shortness of breath or wheezing Cardiovascular ROS: negative for - chest pain, dyspnea on exertion, edema or irregular heartbeat Gastrointestinal ROS: negative for - abdominal pain, diarrhea, hematemesis, nausea/vomiting or stool incontinence Genito-Urinary ROS: negative for - dysuria, hematuria, incontinence or urinary frequency/urgency Musculoskeletal ROS: negative for - joint swelling or muscular weakness Neurological ROS: as noted in HPI Dermatological ROS: negative for rash and skin lesion changes  Neurologic Examination:                                                                                                       Blood pressure 251/109, pulse 113, temperature 97.7 F (36.5 C), temperature source Oral, resp. rate 19, height 5\' 6"  (1.676 m), weight 78.291 kg (172 lb 9.6 oz), SpO2 100 %.  HEENT-  Normocephalic, no lesions, without obvious abnormality.  Normal external eye and conjunctiva.  Normal TM's bilaterally.  Normal auditory canals and external ears. Normal external nose, mucus membranes and septum.  Normal pharynx. Cardiovascular- S1, S2 normal, pulses palpable throughout   Lungs- chest clear, no wheezing, rales, normal symmetric air entry Abdomen- normal findings: bowel sounds normal Extremities- no edema Lymph-no adenopathy palpable Musculoskeletal-no joint tenderness, deformity or swelling Skin-warm and dry, no hyperpigmentation, vitiligo, or suspicious lesions  Neurological Examination Mental Status: Alert, oriented, thought content appropriate.  Speech fluent (adentulous) without evidence of aphasia.  Able to follow 3 step commands without difficulty. Cranial Nerves: II:  Visual fields grossly normal, pupils equal, round, reactive to light and accommodation III,IV, VI: ptosis not present, extra-ocular motions intact bilaterally V,VII: smile symmetric, facial light touch sensation normal bilaterally VIII: hearing normal bilaterally IX,X: uvula rises symmetrically XI: bilateral shoulder shrug XII: midline tongue extension Motor: Right : Upper extremity   5/5    Left:     Upper extremity   5/5  Lower extremity   5/5     Lower extremity   5/5 Tone and bulk:normal tone throughout; no atrophy noted Sensory: Pinprick and light touch intact throughout, bilaterally Deep Tendon Reflexes: 2+ and symmetric throughout Plantars: Right: downgoing   Left: downgoing Cerebellar: normal finger-to-nose, normal rapid alternating movements and normal heel-to-shin test Gait: not tested       Lab Results: Basic Metabolic Panel:  Recent Labs Lab 10/06/15 1542 10/06/15 1547  10/10/15 0552 10/11/15 0400  NA 138 142 139 136  K 3.5 3.6 3.7 4.0  CL 102 102 107 106  CO2 26  --  22 23  GLUCOSE 76 78 95 113*  BUN 14 18 19 15   CREATININE 1.10 1.00 1.11 1.08  CALCIUM 9.1  --  8.7* 8.1*    Liver Function Tests:  Recent Labs Lab 10/06/15 1542  AST 22  ALT 12*  ALKPHOS 57  BILITOT 0.5  PROT 7.6  ALBUMIN 3.5   No results for input(s): LIPASE, AMYLASE in the last 168 hours. No results for input(s): AMMONIA in the last 168 hours.  CBC:  Recent Labs Lab 10/06/15 1542 10/06/15 1547 10/08/15 0007 10/10/15 0552 10/11/15 0400  WBC 4.5  --  5.6 5.8 8.7  NEUTROABS 2.9  --   --   --   --   HGB 12.6* 15.6 12.2* 11.4* 10.1*  HCT 39.8 46.0 38.4* 35.9* 32.0*  MCV 68.4*  --  68.4* 68.0* 67.8*  PLT 181  --  178 165 164    Cardiac Enzymes: No results for input(s): CKTOTAL, CKMB, CKMBINDEX, TROPONINI in the last 168 hours.  Lipid Panel:  Recent Labs Lab 10/07/15 1114  CHOL 262*  TRIG 118  HDL 32*  CHOLHDL 8.2  VLDL 24  LDLCALC 206*    CBG:  Recent Labs Lab 10/06/15 1646 10/11/15 1536  GLUCAP 61* 138*    Microbiology: Results for orders placed or performed during the hospital encounter of 10/06/15  Surgical pcr screen     Status: None   Collection Time: 10/10/15  6:32 AM  Result Value Ref Range Status   MRSA, PCR NEGATIVE NEGATIVE Final   Staphylococcus aureus NEGATIVE NEGATIVE Final    Comment:        The Xpert SA Assay (FDA approved for NASAL specimens in patients over 5 years of age), is one component of a comprehensive surveillance program.  Test performance has been validated by [facility] for patients greater than or equal to 90 year old. It is not intended to diagnose infection nor to guide or monitor treatment.     Coagulation Studies: No results for input(s): LABPROT, INR in the last 72 hours.  Imaging: Ct Coronary Morp W/cta Cor W/score W/ca W/cm &/or Wo/cm  10/10/2015  ADDENDUM REPORT: 10/10/2015 10:55 CLINICAL  DATA:  Chest pain EXAM: Cardiac/Coronary  CT TECHNIQUE: The patient was scanned on a Philips 256 scanner. FINDINGS: A 120 kV prospective scan was triggered in the descending thoracic aorta at 111 HU's. Axial non-contrast 3 mm slices were carried out through the heart. The data set was analyzed on a dedicated work station and scored using the [place]. Gantry rotation speed was 270 msecs and collimation was .9 mm. 5 mg of iv Metoprolol and 0.8 mg of sl NTG was given. The 3D data set was reconstructed in 5% intervals of the 67-82 % of the R-R cycle. Diastolic phases were analyzed on a dedicated work station using MPR, MIP and VRT modes. The patient received 80 cc of contrast. Aorta:  Normal size, minimal calcifications, no dissection. Aortic Valve:  Trileaflet, minimal calcifications. Coronary Arteries: Normal origin.  Left dominance. Left main is a large artery that has mild noncalcified plaque in the proximal segment and mild calcified plaque in the distal portion with associated stenosis 25-50%. LAD is a large artery that has moderate diffuse mixed, predominantly calcified plaque in the proximal and mid segments with associated stenosis 50-69%. Distal LAD has only mild plaque. The first diagonal branch has only minimal plaque, the second diagonal branch has mild calcified ostial plaque. LCX artery has diffuse moderate predominantly calcified plaque in its mid portion with associated stenosis 50-69%. There is a focal spot at the distal LCX artery suspicious of stenosis close to 70%. PLA and PDA have minimal plaque. RCA is a very small non-dominant artery with no obvious plaque. IMPRESSION:  1. Coronary calcium score of 980. This was 41 percentile for age and sex matched control. 2. Normal coronary origin with left dominance. 3. Diffuse moderate plaque in the LAD and LCX arteries. An aggressive medical therapy is recommended. Ena Dawley Electronically Signed   By: Ena Dawley   On: 10/10/2015  10:55  10/10/2015  EXAM: OVER-READ INTERPRETATION  CT CHEST The following report is an over-read performed by radiologist Dr. Rebekah [facility] on 10/10/2015. This over-read does not include interpretation of cardiac or coronary anatomy or pathology. The coronary calcium score/coronary CTA interpretation by the cardiologist is attached. COMPARISON:  No priors. FINDINGS: In the medial aspect of the left lower lobe there is a 12 x 15 mm area of nodular architectural distortion (image 56 of series 304). This is somewhat similar in retrospect to remote prior study 06/09/2007, although slightly more bulky. 6 mm left upper lobe pulmonary nodule (image 29 of series 304). 3 mm left upper lobe nodule (image 39 of series 304). 11 x 7 mm nodule in the inferior aspect of the right upper lobe abutting the minor fissure (image 24 of series 304). No acute consolidative airspace disease. Trace bilateral pleural effusions lying dependently. Areas of ground-glass attenuation and mild interlobular septal thickening are noted throughout the lungs bilaterally, suggesting a background of mild interstitial pulmonary edema. No definite lymphadenopathy within the visualized thorax. Visualized portions of the upper abdomen are remarkable for atherosclerosis. There are no aggressive appearing lytic or blastic lesions noted in the visualized portions of the skeleton. IMPRESSION: 1. Multiple pulmonary nodules scattered throughout the lungs bilaterally. Although the largest of these has been present on prior study dating back to 2008, it does appear slightly more bulky. This largest nodule is favored to represent an area of scarring, however, there are several other nodules that warrant attention on followup studies, and further evaluation with PET-CT is suggested at this time. 2. Evidence of mild interstitial pulmonary edema. 3. Trace bilateral pleural effusions bilaterally. These results will be called to the ordering  clinician or representative by the Radiologist Assistant, and communication documented in the PACS or zVision Dashboard. Electronically Signed: By: Vinnie Langton M.D. On: 10/10/2015 09:57    Etta Quill PA-C [facility] [id number]  10/11/2015, 3:44 PM   Assessment: 75 y.o. male [name] is a 76 y.o. male with history of hypertension, stroke, myocardial infarction, atrial fibrillation presenting with left leg weakness.  Today had episode of Nausea associated with slumping over but now back to baseline. Stroke team is following closely.  Stroke Risk Factors - atrial fibrillation, carotid stenosis, hyperlipidemia and hypertension   Will place back on stroke team.   Recommend: 1) MRI brain 2) Further recommendations if MRI study shows acute cerebral infarction.  I personally participated in this patient's evaluation and management, including formulating above clinical impression and management recommendations.  Rush Farmer M.D. [facility] [phone]

## 2015-10-11 NOTE — Progress Notes (Signed)
[facility] PROGRESS NOTE  [name] [id number] DOB: [birth year] DOA: 10/06/2015 PCP: Cathlean Cower, MD  Brief narrative On 75 year old male with history of hypertension, prior MI, prior CVA and A. fib not on anticoagulation due to history of anemia admitted with one-day history of left leg weakness and numbness. On presentation patient was hypertensive to 220/100 mmHg. Head CT was unremarkable for acute abnormality. MRI of the brain showed acute ischemic infarct of the right frontal, parietal and occipital lobes. Admitted to hospitalist service for stroke workup and management. Neurology following.  Assessment/Plan: Acute ischemic stroke MRI brain shows patchy right frontal, parietal and occipital infarcts. CT angiogram of the head and neck showed high-grade right ICA stenosis with bulky calcified plaque. 2-D echo was unremarkable without any source of embolus. A cerebral angiogram was done showed 90% right supraclinoid stenosis, 50% cavernous stenosis and 70% stenosis at the left vertebrobasilar junction. LDL of 206 and A1c of 6.2.  Continue Lipitor and Zetia. We'll start Eliquis given his A. fib along with baby aspirin. Allow permissive hypertension.  Vascular surgery consulted and patient underwent carotid endarterectomy on 3/9. Tolerated well. -PT recommends CIR. Will consult  Coronary artery disease Continue aspirin, statin  paroxysmal A. fib CHADSVasc score of 5 ( HTN, age, stroke, PVD). Not on anticoagulation due to history of anemia. Cardiology recommends to start him on newer oral  anticoagulant after workup completed. Will start on Eliquis.  Oozing from rt groin Improved after thrombin pads applied.  Essential hypertension Blood pressure elevated. I have resumed his beta blocker and lisinopril. Allowing permissible hypertension.  Diet: Nothing by mouth for carotid endarterectomy  DVT prophylaxis: sq lovenox   Code Status: Full code Family Communication: Wife at  bedside Disposition Plan:  CIR consulted   Consultants:  Neurology  [facility] surgery  Cardiology  IR  Procedures:  Abdomen  CT angiogram of the head and neck  Head CT  Bilateral common carotid and vertebral arteriograms  2-D echo  Coronary CT  Antibiotics:  None  HPI/Subjective: Seen and examined. Denies any pain over his right neck.  Objective: Filed Vitals:   10/11/15 0814 10/11/15 0952  BP: 174/95 191/90  Pulse:    Temp: 97.7 F (36.5 C)   Resp:      Intake/Output Summary (Last 24 hours) at 10/11/15 1150 Last data filed at 10/11/15 1000  Gross per 24 hour  Intake   2453 ml  Output   1225 ml  Net   1228 ml   Filed Weights   10/07/15 0028 10/10/15 1819  Weight: 76.386 kg (168 lb 6.4 oz) 78.291 kg (172 lb 9.6 oz)    Exam:   General:  not in distress  HEENT:  moist mucosa, Swelling over his right neck following CEA.  Chest: Clear bilaterally  CVS: Normal S1 and S2, no murmurs rub or gallop  GI: Soft, nondistended, nontender,  Musculoskeletal: Warm, no edema, No further bleeding from right angiogram site  CNS: Alert and oriented, mild left facial droop, 4+/5 power over her left lower  extremity  Data Reviewed: Basic Metabolic Panel:  Recent Labs Lab 10/06/15 1542 10/06/15 1547 10/10/15 0552 10/11/15 0400  NA 138 142 139 136  K 3.5 3.6 3.7 4.0  CL 102 102 107 106  CO2 26  --  22 23  GLUCOSE 76 78 95 113*  BUN 14 18 19 15   CREATININE 1.10 1.00 1.11 1.08  CALCIUM 9.1  --  8.7* 8.1*   Liver Function Tests:  Recent Labs  Lab 10/06/15 1542  AST 22  ALT 12*  ALKPHOS 57  BILITOT 0.5  PROT 7.6  ALBUMIN 3.5   No results for input(s): LIPASE, AMYLASE in the last 168 hours. No results for input(s): AMMONIA in the last 168 hours. CBC:  Recent Labs Lab 10/06/15 1542 10/06/15 1547 10/08/15 0007 10/10/15 0552 10/11/15 0400  WBC 4.5  --  5.6 5.8 8.7  NEUTROABS 2.9  --   --   --   --   HGB 12.6* 15.6 12.2* 11.4* 10.1*  HCT  39.8 46.0 38.4* 35.9* 32.0*  MCV 68.4*  --  68.4* 68.0* 67.8*  PLT 181  --  178 165 164   Cardiac Enzymes: No results for input(s): CKTOTAL, CKMB, CKMBINDEX, TROPONINI in the last 168 hours. BNP (last 3 results)  Recent Labs  06/19/15 1039  BNP 301.8*    ProBNP (last 3 results) No results for input(s): PROBNP in the last 8760 hours.  CBG:  Recent Labs Lab 10/06/15 1646  GLUCAP 61*    Recent Results (from the past 240 hour(s))  Surgical pcr screen     Status: None   Collection Time: 10/10/15  6:32 AM  Result Value Ref Range Status   MRSA, PCR NEGATIVE NEGATIVE Final   Staphylococcus aureus NEGATIVE NEGATIVE Final    Comment:        The Xpert SA Assay (FDA approved for NASAL specimens in patients over 11 years of age), is one component of a comprehensive surveillance program.  Test performance has been validated by [facility] for patients greater than or equal to 47 year old. It is not intended to diagnose infection nor to guide or monitor treatment.      Studies: Ct Coronary Morp W/cta Cor W/score W/ca W/cm &/or Wo/cm  10/10/2015  ADDENDUM REPORT: 10/10/2015 10:55 CLINICAL DATA:  Chest pain EXAM: Cardiac/Coronary  CT TECHNIQUE: The patient was scanned on a Philips 256 scanner. FINDINGS: A 120 kV prospective scan was triggered in the descending thoracic aorta at 111 HU's. Axial non-contrast 3 mm slices were carried out through the heart. The data set was analyzed on a dedicated work station and scored using the [place]. Gantry rotation speed was 270 msecs and collimation was .9 mm. 5 mg of iv Metoprolol and 0.8 mg of sl NTG was given. The 3D data set was reconstructed in 5% intervals of the 67-82 % of the R-R cycle. Diastolic phases were analyzed on a dedicated work station using MPR, MIP and VRT modes. The patient received 80 cc of contrast. Aorta:  Normal size, minimal calcifications, no dissection. Aortic Valve:  Trileaflet, minimal calcifications. Coronary  Arteries: Normal origin.  Left dominance. Left main is a large artery that has mild noncalcified plaque in the proximal segment and mild calcified plaque in the distal portion with associated stenosis 25-50%. LAD is a large artery that has moderate diffuse mixed, predominantly calcified plaque in the proximal and mid segments with associated stenosis 50-69%. Distal LAD has only mild plaque. The first diagonal branch has only minimal plaque, the second diagonal branch has mild calcified ostial plaque. LCX artery has diffuse moderate predominantly calcified plaque in its mid portion with associated stenosis 50-69%. There is a focal spot at the distal LCX artery suspicious of stenosis close to 70%. PLA and PDA have minimal plaque. RCA is a very small non-dominant artery with no obvious plaque. IMPRESSION: 1. Coronary calcium score of 980. This was 19 percentile for age and sex matched control. 2. Normal  coronary origin with left dominance. 3. Diffuse moderate plaque in the LAD and LCX arteries. An aggressive medical therapy is recommended. Ena Dawley Electronically Signed   By: Ena Dawley   On: 10/10/2015 10:55  10/10/2015  EXAM: OVER-READ INTERPRETATION  CT CHEST The following report is an over-read performed by radiologist Dr. Rebekah [facility] on 10/10/2015. This over-read does not include interpretation of cardiac or coronary anatomy or pathology. The coronary calcium score/coronary CTA interpretation by the cardiologist is attached. COMPARISON:  No priors. FINDINGS: In the medial aspect of the left lower lobe there is a 12 x 15 mm area of nodular architectural distortion (image 56 of series 304). This is somewhat similar in retrospect to remote prior study 06/09/2007, although slightly more bulky. 6 mm left upper lobe pulmonary nodule (image 29 of series 304). 3 mm left upper lobe nodule (image 39 of series 304). 11 x 7 mm nodule in the inferior aspect of the right upper lobe  abutting the minor fissure (image 24 of series 304). No acute consolidative airspace disease. Trace bilateral pleural effusions lying dependently. Areas of ground-glass attenuation and mild interlobular septal thickening are noted throughout the lungs bilaterally, suggesting a background of mild interstitial pulmonary edema. No definite lymphadenopathy within the visualized thorax. Visualized portions of the upper abdomen are remarkable for atherosclerosis. There are no aggressive appearing lytic or blastic lesions noted in the visualized portions of the skeleton. IMPRESSION: 1. Multiple pulmonary nodules scattered throughout the lungs bilaterally. Although the largest of these has been present on prior study dating back to 2008, it does appear slightly more bulky. This largest nodule is favored to represent an area of scarring, however, there are several other nodules that warrant attention on followup studies, and further evaluation with PET-CT is suggested at this time. 2. Evidence of mild interstitial pulmonary edema. 3. Trace bilateral pleural effusions bilaterally. These results will be called to the ordering clinician or representative by the Radiologist Assistant, and communication documented in the PACS or zVision Dashboard. Electronically Signed: By: Vinnie Langton M.D. On: 10/10/2015 09:57    Scheduled Meds: . amLODipine  10 mg Oral Daily  . aspirin  300 mg Rectal Daily   Or  . aspirin  325 mg Oral Daily  . atorvastatin  80 mg Oral Daily  . carvedilol  3.125 mg Oral BID WC  . cefUROXime (ZINACEF)  IV  1.5 g Intravenous Q12H  . docusate sodium  100 mg Oral Daily  . [START ON 10/12/2015] enoxaparin (LOVENOX) injection  40 mg Subcutaneous Daily  . ezetimibe  10 mg Oral Daily  . furosemide  20 mg Oral Daily  . pantoprazole  40 mg Oral Daily  . sodium chloride flush  3 mL Intravenous Q12H  . THROMBI-PAD  1 each Topical Once   Continuous Infusions: . sodium chloride Stopped (10/11/15 0700)   . lactated ringers 10 mL/hr at 10/10/15 1203      Time spent: [place], [facility]  [facility] Pager [phone]. If 7PM-7AM, please contact night-coverage at [email or web address], password [facility] 10/11/2015, 11:50 AM  LOS: 5 days

## 2015-10-12 ENCOUNTER — Inpatient Hospital Stay (HOSPITAL_COMMUNITY): Payer: Medicare Other

## 2015-10-12 DIAGNOSIS — G459 Transient cerebral ischemic attack, unspecified: Secondary | ICD-10-CM

## 2015-10-12 DIAGNOSIS — R55 Syncope and collapse: Secondary | ICD-10-CM

## 2015-10-12 LAB — COMPREHENSIVE METABOLIC PANEL
ALBUMIN: 3 g/dL — AB (ref 3.5–5.0)
ALT: 13 U/L — ABNORMAL LOW (ref 17–63)
ANION GAP: 8 (ref 5–15)
AST: 23 U/L (ref 15–41)
Alkaline Phosphatase: 42 U/L (ref 38–126)
BUN: 14 mg/dL (ref 6–20)
CHLORIDE: 106 mmol/L (ref 101–111)
CO2: 27 mmol/L (ref 22–32)
Calcium: 8.5 mg/dL — ABNORMAL LOW (ref 8.9–10.3)
Creatinine, Ser: 0.97 mg/dL (ref 0.61–1.24)
GFR calc Af Amer: >60 mL/min (ref >60)
GLUCOSE: 101 mg/dL — AB (ref 65–99)
Potassium: 3.8 mmol/L (ref 3.5–5.1)
Sodium: 141 mmol/L (ref 135–145)
TOTAL PROTEIN: 6.9 g/dL (ref 6.5–8.1)
Total Bilirubin: 0.4 mg/dL (ref 0.3–1.2)

## 2015-10-12 LAB — CBC
HCT: 31.7 % — ABNORMAL LOW (ref 39.0–52.0)
Hemoglobin: 9.6 g/dL — ABNORMAL LOW (ref 13.0–17.0)
MCH: 20.8 pg — ABNORMAL LOW (ref 26.0–34.0)
MCHC: 30.3 g/dL (ref 30.0–36.0)
MCV: 68.6 fL — ABNORMAL LOW (ref 78.0–100.0)
PLATELETS: 171 10*3/uL (ref 150–400)
RBC: 4.62 MIL/uL (ref 4.22–5.81)
RDW: 15.8 % — AB (ref 11.5–15.5)
WBC: 7.7 10*3/uL (ref 4.0–10.5)

## 2015-10-12 LAB — AMMONIA: AMMONIA: 27 umol/L (ref 9–35)

## 2015-10-12 NOTE — Progress Notes (Signed)
[facility] PROGRESS NOTE  [name] [id number] DOB: May 14, 1941 DOA: 10/06/2015 PCP: Cathlean Cower, MD  Brief narrative On 75 year old male with history of hypertension, prior MI, prior CVA and A. fib not on anticoagulation due to history of anemia admitted with one-day history of left leg weakness and numbness. On presentation patient was hypertensive to 220/100 mmHg. Head CT was unremarkable for acute abnormality. MRI of the brain showed acute ischemic infarct of the right frontal, parietal and occipital lobes. Admitted to hospitalist service for stroke workup and management. Neurology following. On 3/10 patient became confused and had a near-syncopal event while in the bathroom. He was found to have slurred speech. Code stroke was called. Symptoms had resolved upon my evaluation. Head CT done was unremarkable. MRI of the brain showed evolving small acute infarcts without right cerebral hemisphere with likely watershed ischemia.  Assessment/Plan: Acute ischemic stroke MRI brain shows patchy right frontal, parietal and occipital infarcts. CT angiogram of the head and neck showed high-grade right ICA stenosis with bulky calcified plaque. 2-D echo was unremarkable without any source of embolus. A cerebral angiogram was done showed 90% right supraclinoid stenosis, 50% cavernous stenosis and 70% stenosis at the left vertebrobasilar junction. LDL of 206 and A1c of 6.2.  Continue Lipitor and Zetia. Started on Eliquis given his A. fib along with baby aspirin. Allowing permissive hypertension.  Vascular surgery consulted and patient underwent carotid endarterectomy on 3/9. Tolerated well. -PT recommends CIR who has been consulted.  Near syncope on 3/10  Have slurred speech with near syncope which subsided within a few minutes. Head CT was unremarkable. MRI brain showing small acute infarcts involving throughout right cerebral hemisphere which prominent areas of new infarction, likely watershed  ischemia. Discussed with Dr. Erlinda Hong and recommends that can be seen following CEA and appears this could be a vasovagal or orthostatic event. No new neurological deficit on exam. Speech is clear. We'll continue with current management and monitor.  Coronary artery disease Continue aspirin, statin  paroxysmal A. fib CHADSVasc score of 5 ( HTN, age, stroke, PVD). Not on anticoagulation due to history of anemia. Cardiology recommends to start him on newer oral  anticoagulant after workup completed. Started on Eliquis.  Oozing from rt groin Improved after thrombin pads applied.  Essential hypertension Blood pressure elevated.  resumed his beta blocker and lisinopril. Allowing permissible hypertension.  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 He reports seeing Dr[facility] Jeff[facility] with [facility]. Foley placed in on 3/10 for urinary retention. Started on Flomax. Patient will likely be discharged on Foley and follow-up with Dr. Jeffie Pollock as outpatient  Diet: Heart healthy  DVT prophylaxis: eliquis  Code Status: Full code Family Communication: Wife at bedside Disposition Plan:  CIR consulted   Consultants:  Neurology  [facility] surgery  Cardiology  IR  Procedures:  Abdomen  CT angiogram of the head and neck  Head CT  Bilateral common carotid and vertebral arteriograms  2-D echo  Coronary CT  Antibiotics:  None  HPI/Subjective: Seen and examined. No further overnight events. Eyes any symptoms.  Objective: Filed Vitals:   10/12/15 0500 10/12/15 0721  BP: 180/93 176/80  Pulse: 95 92  Temp:  98 F (36.7 C)  Resp: 16 21    Intake/Output Summary (Last 24 hours) at 10/12/15 1143 Last data filed at 10/12/15 0900  Gross per 24 hour  Intake   2210 ml  Output   1625 ml  Net    585 ml   Filed  Weights   10/07/15 0028 10/10/15 1819  Weight: 76.386 kg (168 lb 6.4 oz) 78.291 kg (172 lb 9.6 oz)    Exam:   General:  not in distress  HEENT:  moist  mucosa, Swelling over his right neck following CEA.  Chest: Clear bilaterally  CVS: Normal S1 and S2, no murmurs  GI: Soft, nondistended, nontender,  Musculoskeletal: Warm, no edema,  CNS: Alert and oriented, no further facial droop, 5/5 power in LLE  Data Reviewed: Basic Metabolic Panel:  Recent Labs Lab 10/06/15 1542 10/06/15 1547 10/10/15 0552 10/11/15 0400 10/12/15 0402  NA 138 142 139 136 141  K 3.5 3.6 3.7 4.0 3.8  CL 102 102 107 106 106  CO2 26  --  22 23 27   GLUCOSE 76 78 95 113* 101*  BUN 14 18 19 15 14   CREATININE 1.10 1.00 1.11 1.08 0.97  CALCIUM 9.1  --  8.7* 8.1* 8.5*   Liver Function Tests:  Recent Labs Lab 10/06/15 1542 10/12/15 0402  AST 22 23  ALT 12* 13*  ALKPHOS 57 42  BILITOT 0.5 0.4  PROT 7.6 6.9  ALBUMIN 3.5 3.0*   No results for input(s): LIPASE, AMYLASE in the last 168 hours.  Recent Labs Lab 10/12/15 0402  AMMONIA 27   CBC:  Recent Labs Lab 10/06/15 1542 10/06/15 1547 10/08/15 0007 10/10/15 0552 10/11/15 0400 10/12/15 0402  WBC 4.5  --  5.6 5.8 8.7 7.7  NEUTROABS 2.9  --   --   --   --   --   HGB 12.6* 15.6 12.2* 11.4* 10.1* 9.6*  HCT 39.8 46.0 38.4* 35.9* 32.0* 31.7*  MCV 68.4*  --  68.4* 68.0* 67.8* 68.6*  PLT 181  --  178 165 164 171   Cardiac Enzymes: No results for input(s): CKTOTAL, CKMB, CKMBINDEX, TROPONINI in the last 168 hours. BNP (last 3 results)  Recent Labs  06/19/15 1039  BNP 301.8*    ProBNP (last 3 results) No results for input(s): PROBNP in the last 8760 hours.  CBG:  Recent Labs Lab 10/06/15 1646 10/11/15 1536  GLUCAP 61* 138*    Recent Results (from the past 240 hour(s))  Surgical pcr screen     Status: None   Collection Time: 10/10/15  6:32 AM  Result Value Ref Range Status   MRSA, PCR NEGATIVE NEGATIVE Final   Staphylococcus aureus NEGATIVE NEGATIVE Final    Comment:        The Xpert SA Assay (FDA approved for NASAL specimens in patients over 71 years of age), is one  component of a comprehensive surveillance program.  Test performance has been validated by [employer] for patients greater than or equal to 56 year old. It is not intended to diagnose infection nor to guide or monitor treatment.      Studies: Ct Head Wo Contrast  10/11/2015  CLINICAL DATA:  Code stroke.  Slurred speech. EXAM: CT HEAD WITHOUT CONTRAST TECHNIQUE: Contiguous axial images were obtained from the base of the skull through the vertex without intravenous contrast. COMPARISON:  10/06/2015 FINDINGS: Mild generalized age related volume loss. Mild chronic small-vessel ischemic change of the deep white matter. No CT evidence of acute infarction, mass lesion, hemorrhage, hydrocephalus or extra-axial collection. Question slight hyperdensity in the right MCA, not definite. Calvarium is unremarkable. Sinuses are clear. There is atherosclerotic calcification of the major vessels at the base of the brain. IMPRESSION: No acute finding by CT. These results were called by telephone at the time  of interpretation on 10/11/2015 at 4:00 pm to Dr. Wallie Char , who verbally acknowledged these results. Electronically Signed   By: Nelson Chimes M.D.   On: 10/11/2015 16:00   Mr Brain Wo Contrast  10/11/2015  CLINICAL DATA:  Acute encephalopathy. Episode of altered mental status in the bathroom earlier today. EXAM: MRI HEAD WITHOUT CONTRAST TECHNIQUE: Multiplanar, multiecho pulse sequences of the brain and surrounding structures were obtained without intravenous contrast. COMPARISON:  Head CT 10/11/2015 and MRI 10/06/2015 FINDINGS: Multiple small foci of restricted diffusion are again seen in the right cerebral hemisphere involving cortex and white matter. Some of these have decreased in conspicuity compared to the prior MRI, however there are scattered areas of acute infarction which are new or slightly larger than on the prior study. These have been marked with arrows on axial diffusion series 4 and are  present in the right parietal cortex, right centrum semiovale and corona radiata, anterior right frontal lobe, right occipital cortex and white matter, and right temporal lobe. There is no evidence of intracranial hemorrhage, mass, midline shift, or extra-axial fluid collection. There is mild cerebral atrophy. Patchy T2 hyperintensities in the cerebral white matter bilaterally are compatible with moderate chronic small vessel ischemic disease. Chronic lacunar infarcts are noted in the thalami and deep cerebral white matter bilaterally. Orbits are unremarkable. Mild paranasal sinus mucosal thickening is noted. Mastoid air cells are clear. Major intracranial vascular flow voids are preserved, with evidence of distal right ICA stenosis better evaluated on recent CTA. IMPRESSION: 1. Evolving, small acute infarcts throughout the right cerebral hemisphere with some areas of new infarction, likely watershed ischemia. 2. Moderate chronic small vessel ischemic disease. Electronically Signed   By: Logan Bores M.D.   On: 10/11/2015 20:49    Scheduled Meds: . amLODipine  10 mg Oral Daily  . apixaban  5 mg Oral BID  . aspirin  81 mg Oral Daily  . atorvastatin  80 mg Oral Daily  . carvedilol  3.125 mg Oral BID WC  . docusate sodium  100 mg Oral Daily  . ezetimibe  10 mg Oral Daily  . furosemide  20 mg Oral Daily  . pantoprazole  40 mg Oral Daily  . sodium chloride flush  3 mL Intravenous Q12H  . tamsulosin  0.4 mg Oral QPC supper  . THROMBI-PAD  1 each Topical Once   Continuous Infusions: . sodium chloride Stopped (10/11/15 0700)  . sodium chloride 100 mL/hr at 10/12/15 0900  . lactated ringers 10 mL/hr at 10/10/15 1203      Time spent: [place], [facility]  [facility] Pager [phone]. If 7PM-7AM, please contact night-coverage at [email or web address], password [facility] 10/12/2015, 11:43 AM  LOS: 6 days

## 2015-10-12 NOTE — Progress Notes (Addendum)
[facility] [place] out in the bathroom yesterday and didn't know what happened until everybody came in to help him back to bed.   Code stroke activated was activated yesterday pending full work up.     Objective 176/80 92 98 F (36.7 C) (Oral) 21 98%  Intake/Output Summary (Last 24 hours) at 10/12/15 0928 Last data filed at 10/12/15 0800  Gross per 24 hour  Intake   1993 ml  Output   1225 ml  Net    768 ml   Right neck incision with minimal edema, no frank hematoma No tongue deviation, smile symmetric B upper ext grip 5/5 grossly equal  IMPRESSION: No acute finding by CT. IMPRESSION: 1. Evolving, small acute infarcts throughout the right cerebral hemisphere with some areas of new infarction, likely watershed ischemia. 2. Moderate chronic small vessel ischemic disease.   Assessment/Planning: POD #2 right carotid Endarterectomy with bovine pericardial patch angioplasty  New infarct yesterday present in the right parietal cortex, right centrum semiovale and corona radiata, anterior right frontal lobe, right occipital cortex and white matter, and right temporal lobe. Patient back to baseline function   Pending carotid duplex  Laurence Slate [facility] 10/12/2015 9:28 AM --  Laboratory Lab Results:  Recent Labs  10/11/15 0400 10/12/15 0402  WBC 8.7 7.7  HGB 10.1* 9.6*  HCT 32.0* 31.7*  PLT 164 171   BMET  Recent Labs  10/11/15 0400 10/12/15 0402  NA 136 141  K 4.0 3.8  CL 106 106  CO2 23 27  GLUCOSE 113* 101*  BUN 15 14  CREATININE 1.08 0.97  CALCIUM 8.1* 8.5*    COAG Lab Results  Component Value Date   INR 1.13 10/06/2015   INR 1.16 06/19/2015   INR 1.04 11/19/2011   No results found for: PTT  Addendum  I have independently interviewed and examined the patient, and I agree with the physician assistant's findings.  Pt remains at baseline.  No recurrence of prior event.  MRI completed.  I  don't think the MRI findings explain his event.  Unclear if his new CVA is related to CEA or intracranial disease.  Radiology: Ct Head Wo Contrast  10/11/2015  CLINICAL DATA:  Code stroke.  Slurred speech. EXAM: CT HEAD WITHOUT CONTRAST TECHNIQUE: Contiguous axial images were obtained from the base of the skull through the vertex without intravenous contrast. COMPARISON:  10/06/2015 FINDINGS: Mild generalized age related volume loss. Mild chronic small-vessel ischemic change of the deep white matter. No CT evidence of acute infarction, mass lesion, hemorrhage, hydrocephalus or extra-axial collection. Question slight hyperdensity in the right MCA, not definite. Calvarium is unremarkable. Sinuses are clear. There is atherosclerotic calcification of the major vessels at the base of the brain. IMPRESSION: No acute finding by CT. These results were called by telephone at the time of interpretation on 10/11/2015 at 4:00 pm to Dr. Wallie Char , who verbally acknowledged these results. Electronically Signed   By: Nelson Chimes M.D.   On: 10/11/2015 16:00   Mr Brain Wo Contrast  10/11/2015  CLINICAL DATA:  Acute encephalopathy. Episode of altered mental status in the bathroom earlier today. EXAM: MRI HEAD WITHOUT CONTRAST TECHNIQUE: Multiplanar, multiecho pulse sequences of the brain and surrounding structures were obtained without intravenous contrast. COMPARISON:  Head CT 10/11/2015 and MRI 10/06/2015 FINDINGS: Multiple small foci of restricted diffusion are again seen in the right cerebral hemisphere involving cortex and white matter. Some of these have decreased  in conspicuity compared to the prior MRI, however there are scattered areas of acute infarction which are new or slightly larger than on the prior study. These have been marked with arrows on axial diffusion series 4 and are present in the right parietal cortex, right centrum semiovale and corona radiata, anterior right frontal lobe, right occipital  cortex and white matter, and right temporal lobe. There is no evidence of intracranial hemorrhage, mass, midline shift, or extra-axial fluid collection. There is mild cerebral atrophy. Patchy T2 hyperintensities in the cerebral white matter bilaterally are compatible with moderate chronic small vessel ischemic disease. Chronic lacunar infarcts are noted in the thalami and deep cerebral white matter bilaterally. Orbits are unremarkable. Mild paranasal sinus mucosal thickening is noted. Mastoid air cells are clear. Major intracranial vascular flow voids are preserved, with evidence of distal right ICA stenosis better evaluated on recent CTA. IMPRESSION: 1. Evolving, small acute infarcts throughout the right cerebral hemisphere with some areas of new infarction, likely watershed ischemia. 2. Moderate chronic small vessel ischemic disease. Electronically Signed   By: Logan Bores M.D.   On: 10/11/2015 20:49   - will continue to follow with you   Adele Barthel, MD [facility]: [phone] Pager: [phone]  10/12/2015, 2:11 PM

## 2015-10-12 NOTE — Progress Notes (Signed)
Received notification from central tele that pt is having runs of SVT, Dr Radford Pax at bedside and notified. Consuelo Pandy RN

## 2015-10-12 NOTE — Progress Notes (Signed)
STROKE TEAM PROGRESS NOTE   SUBJECTIVE (INTERVAL HISTORY) Patient did well with carotid endarterectomy two days ago. Yesterday had syncope going to bathroom. Put back to bed, BP stable and pt back to baseline. Did not check BP at the time of symptom. Code stroke called. Stat CUS ordered but not done. He is sitting up in bed for breakfast today during rounds. He has no complaints, only wants to shave but not able to due to right CEA. MRI repeat showed new punctate watershed area infarcts likely due to CEA procedure or hypotension.  OBJECTIVE Temp:  [97.7 F (36.5 C)-98.1 F (36.7 C)] 98 F (36.7 C) (03/11 0721) Pulse Rate:  [77-113] 92 (03/11 0721) Cardiac Rhythm:  [-] Normal sinus rhythm (03/11 0721) Resp:  [16-31] 21 (03/11 0721) BP: (130-251)/(50-109) 176/80 mmHg (03/11 0721) SpO2:  [88 %-100 %] 98 % (03/11 0721)  CBC:   Recent Labs Lab 10/06/15 1542  10/11/15 0400 10/12/15 0402  WBC 4.5  < > 8.7 7.7  NEUTROABS 2.9  --   --   --   HGB 12.6*  < > 10.1* 9.6*  HCT 39.8  < > 32.0* 31.7*  MCV 68.4*  < > 67.8* 68.6*  PLT 181  < > 164 171  < > = values in this interval not displayed.  Basic Metabolic Panel:   Recent Labs Lab 10/11/15 0400 10/12/15 0402  NA 136 141  K 4.0 3.8  CL 106 106  CO2 23 27  GLUCOSE 113* 101*  BUN 15 14  CREATININE 1.08 0.97  CALCIUM 8.1* 8.5*    Lipid Panel:     Component Value Date/Time   CHOL 262* 10/07/2015 1114   TRIG 118 10/07/2015 1114   HDL 32* 10/07/2015 1114   CHOLHDL 8.2 10/07/2015 1114   VLDL 24 10/07/2015 1114   LDLCALC 206* 10/07/2015 1114   HgbA1c:  Lab Results  Component Value Date   HGBA1C 6.2* 10/07/2015   Urine Drug Screen:     Component Value Date/Time   LABOPIA NONE DETECTED 10/09/2015 1550   LABOPIA NEGATIVE 01/01/2009 0600   COCAINSCRNUR NONE DETECTED 10/09/2015 1550   COCAINSCRNUR NEGATIVE 01/01/2009 0600   LABBENZ POSITIVE* 10/09/2015 1550   LABBENZ NEGATIVE 01/01/2009 0600   AMPHETMU NONE DETECTED  10/09/2015 1550   AMPHETMU NEGATIVE 01/01/2009 0600   THCU NONE DETECTED 10/09/2015 1550   LABBARB NONE DETECTED 10/09/2015 1550      IMAGING I have personally reviewed the radiological images below and agree with the radiology interpretations.  Ct Head Wo Contrast 10/06/2015  1.  No acute intracranial abnormality. 2.  Cerebral atrophy and small vessel ischemic change. 3. Sinus disease.    Ct Angio Head & Neck W/cm &/or Wo/cm 10/07/2015  1. Radiographic String Sign Stenosis at the right ICA origin related to Low-density plaque or thrombus, however, there is superimposed severe intracranial atherosclerosis including high-grade distal right ICA stenosis in part due to bulky calcified plaque. 2. Widespread intracranial moderate to severe first and second order vessel irregularity and stenosis, including severe stenoses at the: -bilateral distal vertebral arteries, - proximal basilar artery, - bilateral PCAs, - right ACA A1, - bilateral ACA distal A2 segments. 3. No intracranial hemorrhage or mass effect.   Mr Brain Wo Contrast 10/06/2015  1. Patchy acute ischemic nonhemorrhagic infarcts involving the cortical gray matter and deep white matter of the right frontal, right parietal, and right occipital lobes. No associated mass effect. 2. Subtle abnormal flow void within the distal right ICA, which  may reflect underlying atheromatous disease, slow flow, and/or partial occlusion. 3. Generalized cerebral atrophy with moderate chronic small vessel ischemic disease. 4. Mild paranasal sinus disease as above.   2D Echocardiogram  - Left ventricle: The cavity size was normal. Wall thickness wasincreased in a pattern of severe LVH. Systolic function wasnormal. The estimated ejection fraction was in the range of 55%to 60%. Left ventricular diastolic function parameters werenormal. - Aortic valve: There was mild regurgitation. - Mitral valve: There was mild regurgitation. - Left atrium: The atrium was  moderately dilated.  Cerebral Angiogram 10/08/2015 1.Preocclusive stenosis of RT ICA prox . 2.Approx 80 % stenosis of RT ICA supraclinoid seg and 50 % of cavernous seg. 3.Occluded RT ACA A1 seg. 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 Waveforms are atypical.  Ct Head Wo Contrast  10/11/2015  IMPRESSION: No acute finding by CT. These results were called by telephone at the time of interpretation on 10/11/2015 at 4:00 pm to Dr. Wallie Char , who verbally acknowledged these results. Electronically Signed   By: Nelson Chimes M.D.   On: 10/11/2015 16:00   Mr Brain Wo Contrast  10/11/2015  IMPRESSION: 1. Evolving, small acute infarcts throughout the right cerebral hemisphere with some areas of new infarction, likely watershed ischemia. 2. Moderate chronic small vessel ischemic disease. Electronically Signed   By: Logan Bores M.D.   On: 10/11/2015 20:49    PHYSICAL EXAM Pleasant elderly Caucasian male currently not in distress. . Afebrile. Head is nontraumatic. Neck is supple without bruit.    Cardiac exam no murmur or gallop. Lungs are clear to auscultation. Distal pulses are well felt. Carotid endarterectomy dressing on the neck on the right Neurological Exam :  Awake alert oriented x 3 normal speech and language. Mild left lower face asymmetry. Tongue midline. No drift. Mild diminished fine finger movements on left. Orbits right over left upper extremity. Subtle left grip weak. Normal sensation . Normal coordination.   ASSESSMENT/PLAN [name] is a 75 y.o. male with history of hypertension, stroke, myocardial infarction, atrial fibrillation presenting with left leg weakness. He did not receive IV t-PA due to delay in arrival.   Near syncope - new right MCA watershed infarcts likely related to CEA procedure vs. hypotension  Resultant -  back to baseline  Likely orthostatic hypotension   Back to baseline rapidly after put back in bed  MRI repeat showed watershed infarct at right MCA  CUS - patent right CEA  Avoid hypotension  Stroke 10/07/15:  patchy right frontal, right parietal and right occipital infarcts, likely secondary to large vessel disease source  MRI  patchy right frontal, right parietal and right occipital infarcts  CTA angio head and neck right ICA origin string sign with superimposed severe intracranial atherosclerosis including high-grade right ICA stenosis due to bulky calcified plaque.  2D Echo  No source of embolus   Cerebral angiogram R supraclinoid ICA 90%, cavernous 50%. Occluded R A1. 70% L VBJ/prox BA  LDL 206  HgbA1c 6.2  Lovenox 40 mg sq daily for VTE prophylaxis Diet Heart Room service appropriate?: Yes; Fluid consistency:: Thin  aspirin 81 mg daily prior to admission, now on eliquis and ASA due to afib and large vessel athero.   Patient counseled to be compliant with his antithrombotic medications  Ongoing aggressive stroke risk factor management  Therapy recommendations:  pending   Disposition:  pending   Intra and Extracranial stenoses  Cerebral angiogram R supraclinoid ICA 90%, cavernous 50%. Occluded R A1. 70% L VBJ/prox BA  Right CEA 10/10/15 by Dr. Trula Slade  CUS 10/12/15 right CEA patent  Atrial Fibrillation  Home anticoagulation:  aspirin 81 mg daily   In note from 2013 stated not on anticoagulation d/t anemia, colonscopy with internal hemorrhoids  11/2011 - subacute stroke found on evaluation of dizziness - pt with afib. Coumadin recommended Seen by neurology (reynolds)  Currently on eliquis and ASA for stroke prevention   Hypertension Stable Avoid hypotension BP goal 120-150 due to multifocal intracranial stenosis  Hyperlipidemia  Home meds:  Lipitor 80, resumed in hospital  LDL 206, goal < 70  Continue statin at discharge  Other Stroke Risk  Factors  Advanced age  Hx stroke/TIA  11/2011 - subacute stroke found on evaluation of dizziness. Coumadin recommended Seen by neurology (reynolds)  Coronary artery disease  Other Active Problems  Chronic kidney disease stage III  BPH  Hospital day # 6  Neurology will sign off. Please call with questions. Pt will follow up with Dr. Leonie Man at [facility] in about 2 months (order has been placed by sharon/Dr. Leonie Man). Thanks for the consult.  Rosalin Hawking, MD PhD Stroke Neurology 10/12/2015 2:11 PM     To contact Stroke Continuity provider, please refer to [email or web address] After hours, contact General Neurology

## 2015-10-12 NOTE — Progress Notes (Addendum)
SUBJECTIVE:  Events of last evening noted with acute CVA .  OBJECTIVE:   Vitals:   Filed Vitals:   10/12/15 0314 10/12/15 0500 10/12/15 0721 10/12/15 1144  BP: 164/78 180/93 176/80 152/67  Pulse: 91 95 92 92  Temp: 98 F (36.7 C)  98 F (36.7 C) 98.6 F (37 C)  TempSrc: Oral  Oral Oral  Resp: 23 16 21 18   Height:      Weight:      SpO2: 98% 97% 98% 96%   I&O's:   Intake/Output Summary (Last 24 hours) at 10/12/15 1231 Last data filed at 10/12/15 0900  Gross per 24 hour  Intake   2210 ml  Output   1225 ml  Net    985 ml   TELEMETRY: Reviewed telemetry pt in NSR     PHYSICAL EXAM General: Well developed, well nourished, in no acute distress Head: Eyes PERRLA, No xanthomas.   Normal cephalic and atramatic  Lungs:   Clear bilaterally to auscultation and percussion. Heart:   HRRR S1 S2 Pulses are 2+ & equal. Abdomen: Bowel sounds are positive, abdomen soft and non-tender without masses Extremities:   No clubbing, cyanosis or edema.  DP +1 Neuro: Alert and oriented X 3. Psych:  Good affect, responds appropriately   LABS: Basic Metabolic Panel:  Recent Labs  10/11/15 0400 10/12/15 0402  NA 136 141  K 4.0 3.8  CL 106 106  CO2 23 27  GLUCOSE 113* 101*  BUN 15 14  CREATININE 1.08 0.97  CALCIUM 8.1* 8.5*   Liver Function Tests:  Recent Labs  10/12/15 0402  AST 23  ALT 13*  ALKPHOS 42  BILITOT 0.4  PROT 6.9  ALBUMIN 3.0*   No results for input(s): LIPASE, AMYLASE in the last 72 hours. CBC:  Recent Labs  10/11/15 0400 10/12/15 0402  WBC 8.7 7.7  HGB 10.1* 9.6*  HCT 32.0* 31.7*  MCV 67.8* 68.6*  PLT 164 171   Cardiac Enzymes: No results for input(s): CKTOTAL, CKMB, CKMBINDEX, TROPONINI in the last 72 hours. BNP: Invalid input(s): POCBNP D-Dimer: No results for input(s): DDIMER in the last 72 hours. Hemoglobin A1C: No results for input(s): HGBA1C in the last 72 hours. Fasting Lipid Panel: No results for input(s): CHOL, HDL, LDLCALC,  TRIG, CHOLHDL, LDLDIRECT in the last 72 hours. Thyroid Function Tests: No results for input(s): TSH, T4TOTAL, T3FREE, THYROIDAB in the last 72 hours.  Invalid input(s): FREET3 Anemia Panel: No results for input(s): VITAMINB12, FOLATE, FERRITIN, TIBC, IRON, RETICCTPCT in the last 72 hours. Coag Panel:   Lab Results  Component Value Date   INR 1.13 10/06/2015   INR 1.16 06/19/2015   INR 1.04 11/19/2011    RADIOLOGY: Ct Angio Head W/cm &/or Wo Cm  10/07/2015  CLINICAL DATA:  75 year old male with acute right hemisphere infarcts, mostly right MCA territory, following presentation for left lower extremity weakness. Initial encounter. EXAM: CT ANGIOGRAPHY HEAD AND NECK TECHNIQUE: Multidetector CT imaging of the head and neck was performed using the standard protocol during bolus administration of intravenous contrast. Multiplanar CT image reconstructions and MIPs were obtained to evaluate the vascular anatomy. Carotid stenosis measurements (when applicable) are obtained utilizing NASCET criteria, using the distal internal carotid diameter as the denominator. CONTRAST:  67mL OMNIPAQUE IOHEXOL 350 MG/ML SOLN COMPARISON:  Brain MRI 10/06/2015 and earlier, including intracranial MRA a 11/20/2011. FINDINGS: CTA NECK Skeleton: Intermittent poor dentition. Widespread chronic cervical spine disc and endplate degeneration. No acute osseous abnormality identified. Mild paranasal  sinus mucosal thickening. That in the left maxillary sinus might be associated with a small supernumerary tooth or tooth fragment (series 6, image 76). Tympanic cavities and mastoids appear clear. Other neck: Negative lung apices. No superior mediastinal lymphadenopathy. Thyroid, larynx, pharynx, parapharyngeal spaces, retropharyngeal space, sublingual space, submandibular glands, and parotid glands are within normal limits. Visualized orbits and scalp soft tissues are within normal limits. No cervical lymphadenopathy. Aortic arch: Bovine  arch configuration. Mild to moderate arch atherosclerosis, with both soft and calcified plaque. Great vessel dolichoectasia and tortuosity. Right carotid system: No brachiocephalic artery or right CCA origin stenosis. Tortuous proximal right CCA. Soft and calcified plaque at the right carotid bifurcation with high-grade string sign stenosis, with low-density plaque affecting the proximal 10 mm of the vessel. See sagittal image 57 of series 10. Despite this the right ICA remains patent. The vessel is mildly tortuous with a partially retropharyngeal course. Left carotid system: Bovine left CCA origin without stenosis. Mild calcified plaque at the left carotid bifurcation, widely patent left ICA origin and bulb. Tortuous but otherwise negative cervical left ICA, with a partially retropharyngeal course. Vertebral arteries:No proximal right subclavian artery stenosis despite calcified plaque. Calcified plaque near the right vertebral artery origin but without origin stenosis. However, there is soft plaque affecting the right V1 segment with moderate stenosis best seen on series 9, image 144. Occasionally tortuous right vertebral the 2 segment, and occasional calcified plaque but no other cervical right vertebral artery stenosis. No proximal left subclavian artery stenosis. Mildly dominant left vertebral artery with no origin stenosis despite soft plaque. V3 segment calcified plaque but no cervical left vertebral artery stenosis. CTA HEAD Posterior circulation: Bilateral calcified plaque affecting the vertebral arteries at the skullbase, however, a moderate to severe bilateral distal vertebral artery stenosis occurs due to soft plaque. Subsequent high-grade stenosis in both distal vertebral arteries and at the vertebrobasilar junction. Moderately to severely irregular basilar artery with additional tandem stenoses. However, the basilar remains patent. Severe irregularity and stenosis at both PCA origins and also affecting  the SCA origins to a lesser extent. Posterior communicating arteries might be present but are diminutive and irregular. Bilateral PCA branches are severely irregular and stenotic, worse on the right. Anterior circulation: Proximal ICA siphons are patent without plaque. However, there is moderate to severe calcified plaque from the cavernous segments to the supraclinoid ICAs. On the right this results in high-grade supraclinoid ICA stenosis best seen on series 8, image 111. There is moderate stenosis on the left. There is additional moderate to severe distal right ICA/terminus stenosis related to calcified plaque best seen on series 8, image 110. Despite these findings both carotid termini are patent. There is irregularity at both MCA and ACA origins which remain patent. Severe stenosis of the right A1. The anterior communicating artery is present. Moderate to severe bilateral A2 segment irregularity and multifocal stenoses, best seen on series 13, image 18. Left MCA origin is patent with mild irregularity. Mild left M1 irregularity, with moderate Left MCA bifurcation is patent multifocal proximal M2 branch irregularity and stenosis best seen on series 11, image 22. On the right the MCA origin is mildly stenotic. The M1 segment is mildly to moderately irregular. The right MCA bifurcation is. There is moderate multifocal irregularity and stenosis in the dominant anterior M2 branch with attenuated anterior division MCA enhancement. Mild posterior division vessel irregularity. Venous sinuses: Patent. Anatomic variants: Bovine arch configuration. Delayed phase: No abnormal enhancement identified. Recently seen scattered small right MCA infarcts  remain largely occult on CT. No acute intracranial hemorrhage identified. No intracranial mass effect. IMPRESSION: 1. Radiographic String Sign Stenosis at the right ICA origin related to Low-density plaque or thrombus, however, there is superimposed severe intracranial  atherosclerosis including high-grade distal right ICA stenosis in part due to bulky calcified plaque. 2. Widespread intracranial moderate to severe first and second order vessel irregularity and stenosis, including severe stenoses at the: -bilateral distal vertebral arteries, - proximal basilar artery, - bilateral PCAs, - right ACA A1, - bilateral ACA distal A2 segments. 3. No intracranial hemorrhage or mass effect. Salient findings discussed by telephone with Neurology NP Burnetta Sabin on 10/07/2015 at 1142 hours. Electronically Signed   By: Genevie Ann M.D.   On: 10/07/2015 11:45   Ct Head Wo Contrast  10/11/2015  CLINICAL DATA:  Code stroke.  Slurred speech. EXAM: CT HEAD WITHOUT CONTRAST TECHNIQUE: Contiguous axial images were obtained from the base of the skull through the vertex without intravenous contrast. COMPARISON:  10/06/2015 FINDINGS: Mild generalized age related volume loss. Mild chronic small-vessel ischemic change of the deep white matter. No CT evidence of acute infarction, mass lesion, hemorrhage, hydrocephalus or extra-axial collection. Question slight hyperdensity in the right MCA, not definite. Calvarium is unremarkable. Sinuses are clear. There is atherosclerotic calcification of the major vessels at the base of the brain. IMPRESSION: No acute finding by CT. These results were called by telephone at the time of interpretation on 10/11/2015 at 4:00 pm to Dr. Wallie Char , who verbally acknowledged these results. Electronically Signed   By: Nelson Chimes M.D.   On: 10/11/2015 16:00   Ct Head Wo Contrast  10/06/2015  CLINICAL DATA:  Left leg weakness. History of left MCA territory infarct. EXAM: CT HEAD WITHOUT CONTRAST TECHNIQUE: Contiguous axial images were obtained from the base of the skull through the vertex without intravenous contrast. COMPARISON:  Brain MR 11/20/2011.  CT 11/19/2011. FINDINGS: Sinuses/Soft tissues: Mucosal thickening of left maxillary sinus ain ethmoid air cells. Clear  mastoid air cells. Intracranial: Mild low density in the periventricular white matter likely related to small vessel disease. Cerebral and cerebellar atrophy. Vertebral and carotid atherosclerosis bilaterally. No mass lesion, hemorrhage, hydrocephalus, acute infarct, intra-axial, or extra-axial fluid collection. IMPRESSION: 1.  No acute intracranial abnormality. 2.  Cerebral atrophy and small vessel ischemic change. 3. Sinus disease. Electronically Signed   By: Abigail Miyamoto M.D.   On: 10/06/2015 17:15   Ct Angio Neck W/cm &/or Wo/cm  10/07/2015  CLINICAL DATA:  75 year old male with acute right hemisphere infarcts, mostly right MCA territory, following presentation for left lower extremity weakness. Initial encounter. EXAM: CT ANGIOGRAPHY HEAD AND NECK TECHNIQUE: Multidetector CT imaging of the head and neck was performed using the standard protocol during bolus administration of intravenous contrast. Multiplanar CT image reconstructions and MIPs were obtained to evaluate the vascular anatomy. Carotid stenosis measurements (when applicable) are obtained utilizing NASCET criteria, using the distal internal carotid diameter as the denominator. CONTRAST:  39mL OMNIPAQUE IOHEXOL 350 MG/ML SOLN COMPARISON:  Brain MRI 10/06/2015 and earlier, including intracranial MRA a 11/20/2011. FINDINGS: CTA NECK Skeleton: Intermittent poor dentition. Widespread chronic cervical spine disc and endplate degeneration. No acute osseous abnormality identified. Mild paranasal sinus mucosal thickening. That in the left maxillary sinus might be associated with a small supernumerary tooth or tooth fragment (series 6, image 76). Tympanic cavities and mastoids appear clear. Other neck: Negative lung apices. No superior mediastinal lymphadenopathy. Thyroid, larynx, pharynx, parapharyngeal spaces, retropharyngeal space, sublingual space, submandibular glands,  and parotid glands are within normal limits. Visualized orbits and scalp soft tissues  are within normal limits. No cervical lymphadenopathy. Aortic arch: Bovine arch configuration. Mild to moderate arch atherosclerosis, with both soft and calcified plaque. Great vessel dolichoectasia and tortuosity. Right carotid system: No brachiocephalic artery or right CCA origin stenosis. Tortuous proximal right CCA. Soft and calcified plaque at the right carotid bifurcation with high-grade string sign stenosis, with low-density plaque affecting the proximal 10 mm of the vessel. See sagittal image 57 of series 10. Despite this the right ICA remains patent. The vessel is mildly tortuous with a partially retropharyngeal course. Left carotid system: Bovine left CCA origin without stenosis. Mild calcified plaque at the left carotid bifurcation, widely patent left ICA origin and bulb. Tortuous but otherwise negative cervical left ICA, with a partially retropharyngeal course. Vertebral arteries:No proximal right subclavian artery stenosis despite calcified plaque. Calcified plaque near the right vertebral artery origin but without origin stenosis. However, there is soft plaque affecting the right V1 segment with moderate stenosis best seen on series 9, image 144. Occasionally tortuous right vertebral the 2 segment, and occasional calcified plaque but no other cervical right vertebral artery stenosis. No proximal left subclavian artery stenosis. Mildly dominant left vertebral artery with no origin stenosis despite soft plaque. V3 segment calcified plaque but no cervical left vertebral artery stenosis. CTA HEAD Posterior circulation: Bilateral calcified plaque affecting the vertebral arteries at the skullbase, however, a moderate to severe bilateral distal vertebral artery stenosis occurs due to soft plaque. Subsequent high-grade stenosis in both distal vertebral arteries and at the vertebrobasilar junction. Moderately to severely irregular basilar artery with additional tandem stenoses. However, the basilar remains  patent. Severe irregularity and stenosis at both PCA origins and also affecting the SCA origins to a lesser extent. Posterior communicating arteries might be present but are diminutive and irregular. Bilateral PCA branches are severely irregular and stenotic, worse on the right. Anterior circulation: Proximal ICA siphons are patent without plaque. However, there is moderate to severe calcified plaque from the cavernous segments to the supraclinoid ICAs. On the right this results in high-grade supraclinoid ICA stenosis best seen on series 8, image 111. There is moderate stenosis on the left. There is additional moderate to severe distal right ICA/terminus stenosis related to calcified plaque best seen on series 8, image 110. Despite these findings both carotid termini are patent. There is irregularity at both MCA and ACA origins which remain patent. Severe stenosis of the right A1. The anterior communicating artery is present. Moderate to severe bilateral A2 segment irregularity and multifocal stenoses, best seen on series 13, image 18. Left MCA origin is patent with mild irregularity. Mild left M1 irregularity, with moderate Left MCA bifurcation is patent multifocal proximal M2 branch irregularity and stenosis best seen on series 11, image 22. On the right the MCA origin is mildly stenotic. The M1 segment is mildly to moderately irregular. The right MCA bifurcation is. There is moderate multifocal irregularity and stenosis in the dominant anterior M2 branch with attenuated anterior division MCA enhancement. Mild posterior division vessel irregularity. Venous sinuses: Patent. Anatomic variants: Bovine arch configuration. Delayed phase: No abnormal enhancement identified. Recently seen scattered small right MCA infarcts remain largely occult on CT. No acute intracranial hemorrhage identified. No intracranial mass effect. IMPRESSION: 1. Radiographic String Sign Stenosis at the right ICA origin related to Low-density  plaque or thrombus, however, there is superimposed severe intracranial atherosclerosis including high-grade distal right ICA stenosis in part due to bulky  calcified plaque. 2. Widespread intracranial moderate to severe first and second order vessel irregularity and stenosis, including severe stenoses at the: -bilateral distal vertebral arteries, - proximal basilar artery, - bilateral PCAs, - right ACA A1, - bilateral ACA distal A2 segments. 3. No intracranial hemorrhage or mass effect. Salient findings discussed by telephone with Neurology NP Burnetta Sabin on 10/07/2015 at 1142 hours. Electronically Signed   By: Genevie Ann M.D.   On: 10/07/2015 11:45   Mr Brain Wo Contrast  10/11/2015  CLINICAL DATA:  Acute encephalopathy. Episode of altered mental status in the bathroom earlier today. EXAM: MRI HEAD WITHOUT CONTRAST TECHNIQUE: Multiplanar, multiecho pulse sequences of the brain and surrounding structures were obtained without intravenous contrast. COMPARISON:  Head CT 10/11/2015 and MRI 10/06/2015 FINDINGS: Multiple small foci of restricted diffusion are again seen in the right cerebral hemisphere involving cortex and white matter. Some of these have decreased in conspicuity compared to the prior MRI, however there are scattered areas of acute infarction which are new or slightly larger than on the prior study. These have been marked with arrows on axial diffusion series 4 and are present in the right parietal cortex, right centrum semiovale and corona radiata, anterior right frontal lobe, right occipital cortex and white matter, and right temporal lobe. There is no evidence of intracranial hemorrhage, mass, midline shift, or extra-axial fluid collection. There is mild cerebral atrophy. Patchy T2 hyperintensities in the cerebral white matter bilaterally are compatible with moderate chronic small vessel ischemic disease. Chronic lacunar infarcts are noted in the thalami and deep cerebral white matter bilaterally. Orbits  are unremarkable. Mild paranasal sinus mucosal thickening is noted. Mastoid air cells are clear. Major intracranial vascular flow voids are preserved, with evidence of distal right ICA stenosis better evaluated on recent CTA. IMPRESSION: 1. Evolving, small acute infarcts throughout the right cerebral hemisphere with some areas of new infarction, likely watershed ischemia. 2. Moderate chronic small vessel ischemic disease. Electronically Signed   By: Logan Bores M.D.   On: 10/11/2015 20:49   Mr Brain Wo Contrast  10/06/2015  CLINICAL DATA:  Initial evaluation for acute left leg weakness. EXAM: MRI HEAD WITHOUT CONTRAST TECHNIQUE: Multiplanar, multiecho pulse sequences of the brain and surrounding structures were obtained without intravenous contrast. COMPARISON:  Prior CT from earlier the same day. FINDINGS: There are multi focal patchy areas of restricted diffusion involving the cortical gray matter and subcortical white matter of the right frontal and parietal lobes. Few scattered small foci of patchy ischemic infarcts present within the right occipital lobe as well. These primarily involve the cortical gray matter and deep white matter of the centrum semi ovale. No basal ganglia infarct. No associated hemorrhage or significant mass effect. No other infarct involving the left cerebral hemisphere or infratentorial brain. Circumferential FLAIR signal within the visualized distal cervical/petrous right ICA suggestive of possible slow flow and/or partial occlusion or atheromatous disease. Major intracranial vascular flow voids otherwise maintained. Diffuse prominence of the CSF containing spaces compatible with generalized cerebral atrophy. Patchy T2/FLAIR hyperintensity within the periventricular and deep white matter both cerebral hemispheres most consistent with chronic small vessel ischemic disease, moderate in nature. Small vessel type changes present within the pons. Few scattered small remote lacunar infarcts  within the bilateral corona radiata. Small remote lacunar infarct within the left thalamus. No mass lesion, midline shift, or mass effect. No hydrocephalus. No extra-axial fluid collection. Major dural sinuses are grossly patent. Craniocervical junction within normal limits. Mild degenerative spondylolysis present within the  upper cervical spine without significant stenosis. Pituitary gland normal.  No acute abnormality about the orbits. Scattered mucosal thickening within the paranasal sinuses. Small air-fluid level within the left maxillary sinuses. No mastoid effusion. Inner ear structures within normal limits. Bone marrow signal intensity normal. No scalp soft tissue abnormality. IMPRESSION: 1. Patchy acute ischemic nonhemorrhagic infarcts involving the cortical gray matter and deep white matter of the right frontal, right parietal, and right occipital lobes. No associated mass effect. 2. Subtle abnormal flow void within the distal right ICA, which may reflect underlying atheromatous disease, slow flow, and/or partial occlusion. 3. Generalized cerebral atrophy with moderate chronic small vessel ischemic disease. 4. Mild paranasal sinus disease as above. Electronically Signed   By: Jeannine Boga M.D.   On: 10/06/2015 22:30   Ct Coronary Morp W/cta Cor W/score W/ca W/cm &/or Wo/cm  10/10/2015  ADDENDUM REPORT: 10/10/2015 10:55 CLINICAL DATA:  Chest pain EXAM: Cardiac/Coronary  CT TECHNIQUE: The patient was scanned on a Philips 256 scanner. FINDINGS: A 120 kV prospective scan was triggered in the descending thoracic aorta at 111 HU's. Axial non-contrast 3 mm slices were carried out through the heart. The data set was analyzed on a dedicated work station and scored using the [place]. Gantry rotation speed was 270 msecs and collimation was .9 mm. 5 mg of iv Metoprolol and 0.8 mg of sl NTG was given. The 3D data set was reconstructed in 5% intervals of the 67-82 % of the R-R cycle. Diastolic phases  were analyzed on a dedicated work station using MPR, MIP and VRT modes. The patient received 80 cc of contrast. Aorta:  Normal size, minimal calcifications, no dissection. Aortic Valve:  Trileaflet, minimal calcifications. Coronary Arteries: Normal origin.  Left dominance. Left main is a large artery that has mild noncalcified plaque in the proximal segment and mild calcified plaque in the distal portion with associated stenosis 25-50%. LAD is a large artery that has moderate diffuse mixed, predominantly calcified plaque in the proximal and mid segments with associated stenosis 50-69%. Distal LAD has only mild plaque. The first diagonal branch has only minimal plaque, the second diagonal branch has mild calcified ostial plaque. LCX artery has diffuse moderate predominantly calcified plaque in its mid portion with associated stenosis 50-69%. There is a focal spot at the distal LCX artery suspicious of stenosis close to 70%. PLA and PDA have minimal plaque. RCA is a very small non-dominant artery with no obvious plaque. IMPRESSION: 1. Coronary calcium score of 980. This was 60 percentile for age and sex matched control. 2. Normal coronary origin with left dominance. 3. Diffuse moderate plaque in the LAD and LCX arteries. An aggressive medical therapy is recommended. Ena Dawley Electronically Signed   By: Ena Dawley   On: 10/10/2015 10:55  10/10/2015  EXAM: OVER-READ INTERPRETATION  CT CHEST The following report is an over-read performed by radiologist Dr. Rebekah [place] on 10/10/2015. This over-read does not include interpretation of cardiac or coronary anatomy or pathology. The coronary calcium score/coronary CTA interpretation by the cardiologist is attached. COMPARISON:  No priors. FINDINGS: In the medial aspect of the left lower lobe there is a 12 x 15 mm area of nodular architectural distortion (image 56 of series 304). This is somewhat similar in retrospect to remote prior  study 06/09/2007, although slightly more bulky. 6 mm left upper lobe pulmonary nodule (image 29 of series 304). 3 mm left upper lobe nodule (image 39 of series 304). 11 x 7 mm  nodule in the inferior aspect of the right upper lobe abutting the minor fissure (image 24 of series 304). No acute consolidative airspace disease. Trace bilateral pleural effusions lying dependently. Areas of ground-glass attenuation and mild interlobular septal thickening are noted throughout the lungs bilaterally, suggesting a background of mild interstitial pulmonary edema. No definite lymphadenopathy within the visualized thorax. Visualized portions of the upper abdomen are remarkable for atherosclerosis. There are no aggressive appearing lytic or blastic lesions noted in the visualized portions of the skeleton. IMPRESSION: 1. Multiple pulmonary nodules scattered throughout the lungs bilaterally. Although the largest of these has been present on prior study dating back to 2008, it does appear slightly more bulky. This largest nodule is favored to represent an area of scarring, however, there are several other nodules that warrant attention on followup studies, and further evaluation with PET-CT is suggested at this time. 2. Evidence of mild interstitial pulmonary edema. 3. Trace bilateral pleural effusions bilaterally. These results will be called to the ordering clinician or representative by the Radiologist Assistant, and communication documented in the PACS or zVision Dashboard. Electronically Signed: By: Vinnie Langton M.D. On: 10/10/2015 09:57   Ir Angio Intra Extracran Sel Com Carotid Innominate Bilat Mod Sed  10/08/2015  CLINICAL DATA:  Left-sided weakness, leg greater than arm. Abnormal CT angiogram of the brain and neck. EXAM: BILATERAL COMMON CAROTID AND INNOMINATE ANGIOGRAPHY AND BILATERAL VERTEBRAL ARTERY ANGIOGRAMS PROCEDURE: Contrast:  60 ml 8mL OMNIPAQUE IOHEXOL 300 MG/ML  SOLN Anesthesia/Sedation:  Conscious sedation.  Medications: Versed at 1 mg IV.  Fentanyl 25 mcg IV. Following a full explanation of the procedure along with the potential associated complications, an informed witnessed consent was obtained. The right groin was prepped and draped in the usual sterile fashion. Thereafter using modified Seldinger technique, transfemoral access into the right common femoral artery was obtained without difficulty. Over a 0.035 inch guidewire, a 5 French Pinnacle sheath was inserted. Through this, and also over 0.035 inch guidewire, a 5 [place] JB 1 catheter was advanced to the aortic arch region and selectively positioned in the right common carotid artery, the right subclavian artery, the left common carotid artery and the left subclavian artery. There were no acute complications. The patient tolerated the procedure well. FINDINGS: The right common carotid arteriogram demonstrates the right external carotid artery to be mildly narrowed proximal. Its branches are normally opacified otherwise. The right internal carotid artery has severe pre occlusive stenosis on the lateral, likely a string sign with mild poststenotic dilatation. There is mild tortuosity of the junction of the proximal 1/3 and middle 1/3 of the cervical right ICA. More distally, the vessel is seen to opacify normally to the cranial skull base. The petrous and the proximal cavernous segments are widely patent. There is an approximately 50% stenosis of the caval cavernous segment, with approximately 80-85% narrowing of the supraclinoid right ICA. The right middle cerebral artery distribution is seen to opacify into the capillary and the venous phases. Focal areas of caliber narrowing with irregularity represent intracranial arteriosclerotic disease. There is no angiographic visualization of the right anterior cerebral A1 segment. The right vertebral artery origin is patent. The vessel is seen to opacify to the cranial skull base where it opacifies the left  posterior-inferior cerebellar artery and the left vertebrobasilar junction. Flow is seen into the distal basilar artery, the posterior cerebral arteries and superior cerebellar arteries. Extreme attenuation of the contrast in the proximal basilar artery is seen. The left vertebral artery origin is normal.  The vessel is seen to opacify to the cranial skull base. There is an approximately 50% narrowing of the left vertebrobasilar junction just proximal to the left posterior-inferior cerebellar artery. High-grade 75 plus stenosis seen of the left vertebrobasilar junction just distal to the origin of the left posterior inferior cerebellar artery. Additionally there is approximately 70% narrowing of the proximal basilar artery. The visualized posterior cerebral arteries, superior cerebellar arteries and the anterior-inferior cerebellar arteries including the left posterior inferior cerebellar artery demonstrated focal areas of caliber narrowing with irregularity most consistent with intracranial arteriosclerosis. The left common carotid arteriogram demonstrates the left external carotid artery and its major branches to be widely patent. The left internal carotid artery at the bulb demonstrates minimal plaque without associated ulcerations. Mild tortuosity is seen of the junction of the middle and the proximal 1/3 of the left internal carotid artery. More distally the vessel is seen to opacify to the cranial skull base. The petrous segment is widely patent. The cavernous segment demonstrates mild focal areas of caliber irregularity with approximately 50% narrowing of the distal cavernous segment. The supraclinoid segment appears widely patent. The left middle and the left anterior cerebral arteries are seen to opacify into the capillary and the venous phases. Scattered focal areas of caliber irregularity with narrowing suggest intracranial arteriosclerosis. Also seen is simultaneous opacification via the anterior  communicating artery of the right anterior cerebral A2 segment and also an attenuated distal right A1 segment. There appears to be contrast leaking into the right middle cerebral artery at the proximal A1 segment. The right A2 segment demonstrates focal areas of caliber irregularity consistent with intracranial arteriosclerosis. 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 Approximately 85% narrowing of the right internal carotid artery supraclinoid segment, and 50% narrowing of the right internal carotid artery cavernous segment. Approximately 75% stenosis of the left vertebrobasilar junction just distal to the origin of the left posterior-inferior cerebellar artery, and approximately 70% narrowing of the proximal basilar artery. Approximately 50% narrowing of the left internal carotid artery cavernous segment. 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 Electronically Signed   By: Luanne Bras M.D.   On: 10/08/2015 11:34   Ir Angio Vertebral Sel Subclavian Innominate Bilat Mod Sed  10/08/2015  CLINICAL DATA:  Left-sided weakness, leg greater than arm. Abnormal CT angiogram of the brain and neck. EXAM: BILATERAL COMMON CAROTID AND INNOMINATE ANGIOGRAPHY AND BILATERAL VERTEBRAL ARTERY ANGIOGRAMS PROCEDURE: Contrast:  60 ml 28mL OMNIPAQUE IOHEXOL 300 MG/ML  SOLN Anesthesia/Sedation:  Conscious sedation. Medications: Versed at 1 mg IV.  Fentanyl 25 mcg IV. Following a full explanation of the procedure along with the potential associated complications, an informed witnessed consent was obtained. The right groin was prepped and draped in the usual sterile fashion. Thereafter using modified Seldinger technique, transfemoral access into the right common femoral artery was obtained without difficulty. Over a 0.035 inch guidewire, a 5 French Pinnacle sheath was inserted. Through this, and also over 0.035 inch guidewire,  a 5 [place] JB 1 catheter was advanced to the aortic arch region and selectively positioned in the right common carotid artery, the right subclavian artery, the left common carotid artery and the left subclavian artery. There were no acute complications. The patient tolerated the procedure well. FINDINGS: The right common carotid arteriogram demonstrates the right external carotid artery to be mildly narrowed proximal. Its branches are normally opacified otherwise. The right internal carotid artery has severe pre occlusive stenosis  on the lateral, likely a string sign with mild poststenotic dilatation. There is mild tortuosity of the junction of the proximal 1/3 and middle 1/3 of the cervical right ICA. More distally, the vessel is seen to opacify normally to the cranial skull base. The petrous and the proximal cavernous segments are widely patent. There is an approximately 50% stenosis of the caval cavernous segment, with approximately 80-85% narrowing of the supraclinoid right ICA. The right middle cerebral artery distribution is seen to opacify into the capillary and the venous phases. Focal areas of caliber narrowing with irregularity represent intracranial arteriosclerotic disease. There is no angiographic visualization of the right anterior cerebral A1 segment. The right vertebral artery origin is patent. The vessel is seen to opacify to the cranial skull base where it opacifies the left posterior-inferior cerebellar artery and the left vertebrobasilar junction. Flow is seen into the distal basilar artery, the posterior cerebral arteries and superior cerebellar arteries. Extreme attenuation of the contrast in the proximal basilar artery is seen. The left vertebral artery origin is normal. The vessel is seen to opacify to the cranial skull base. There is an approximately 50% narrowing of the left vertebrobasilar junction just proximal to the left posterior-inferior cerebellar artery. High-grade 75 plus stenosis  seen of the left vertebrobasilar junction just distal to the origin of the left posterior inferior cerebellar artery. Additionally there is approximately 70% narrowing of the proximal basilar artery. The visualized posterior cerebral arteries, superior cerebellar arteries and the anterior-inferior cerebellar arteries including the left posterior inferior cerebellar artery demonstrated focal areas of caliber narrowing with irregularity most consistent with intracranial arteriosclerosis. The left common carotid arteriogram demonstrates the left external carotid artery and its major branches to be widely patent. The left internal carotid artery at the bulb demonstrates minimal plaque without associated ulcerations. Mild tortuosity is seen of the junction of the middle and the proximal 1/3 of the left internal carotid artery. More distally the vessel is seen to opacify to the cranial skull base. The petrous segment is widely patent. The cavernous segment demonstrates mild focal areas of caliber irregularity with approximately 50% narrowing of the distal cavernous segment. The supraclinoid segment appears widely patent. The left middle and the left anterior cerebral arteries are seen to opacify into the capillary and the venous phases. Scattered focal areas of caliber irregularity with narrowing suggest intracranial arteriosclerosis. Also seen is simultaneous opacification via the anterior communicating artery of the right anterior cerebral A2 segment and also an attenuated distal right A1 segment. There appears to be contrast leaking into the right middle cerebral artery at the proximal A1 segment. The right A2 segment demonstrates focal areas of caliber irregularity consistent with intracranial arteriosclerosis. 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 Approximately 85% narrowing of the right internal carotid artery supraclinoid segment, and  50% narrowing of the right internal carotid artery cavernous segment. Approximately 75% stenosis of the left vertebrobasilar junction just distal to the origin of the left posterior-inferior cerebellar artery, and approximately 70% narrowing of the proximal basilar artery. Approximately 50% narrowing of the left internal carotid artery cavernous segment. 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 Electronically Signed   By: Luanne Bras M.D.   On: 10/08/2015 11:34    ASSESSMENT/PLAN:  1. 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 S/P right CEA POD #2.  2. 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 Coronary calcium score 980 (81% for age) with diffuse moderate plaque in LAD and LCX with small non dominant RCA with no plaque. Needs aggressive risk factor modification. HbgA1C mildly elevated so needs followup with PCP as outpt. LDL markedly elevated despite high dose statin. Zetia added this admission. Needs aggressive BP management. Restart Imdur when and if  ok with surgery and Neuro. Continue ASA/statin/BB. Need to d/c Cialis since he is on long acting nitrates at home.   3. HTN - remains poorly controlled. Continue amlodipine and coreg and consider restarting home dose of ACE I. Allowing permissible hypertension.  4. Dyslipidemia - LDL poorly controlled at 206 despite high dose statin. ? Whether he has been taking his statin at home. Zetia 10mg  daily added. Will get FLP in 8 weeks and if still elevated then refer to lipid clinic for consideration of [facility] 9 drug.   5. PAF - he was not on longterm anticoagulation at home for unclear reasons. This patients CHA2DS2-VASc Score and unadjusted Ischemic Stroke Rate (% per year) is equal to 9.8 % stroke rate/year from a score of 6. Above score calculated as 1 point each if  present [CHF, HTN, DM, Vascular=MI/PAD/Aortic Plaque, Age if 65-74, or Male]. Above score calculated as 2 points each if present [Age > 75, or Stroke/TIA/TE]. Now on Eliquis.  Now having short runs of SVT but asymptomatic.  Will continue BB.  May need to increase Coreg dose if he has more frequent episodes.  Neuro wants BP running on the high side so may need to stop amlodipine if more beta blockade needed.  6.  Acute ischemic CVA - MRI brain shows patchy right frontal, parietal and occipital infarcts. CT angiogram of the head and neck showed high-grade right ICA stenosis with bulky calcified plaque.  S/P right CEA. Yesterday patient became confused and had a near-syncopal event while in the bathroom. He was found to have slurred speech. Code stroke was called.  Head CT done was unremarkable. MRI of the brain showed evolving small acute infarcts without right cerebral hemisphere with likely watershed ischemia.  Neuro stated that this can be seen following CEA and appears this could be a vasovagal or orthostatic event. No new neurological deficit on exam.     Craig Margarita, MD  10/12/2015  12:31 PM

## 2015-10-12 NOTE — Progress Notes (Signed)
VASCULAR LAB PRELIMINARY  PRELIMINARY  PRELIMINARY  PRELIMINARY  Right carotid duplex completed.    Preliminary report:  Right:  CEA appears patent with no thrombus or intimal flap noted.  Waveforms are atypical.  Sharaine Delange, RVT 10/12/2015, 12:23 PM

## 2015-10-13 NOTE — Progress Notes (Addendum)
SUBJECTIVE:  No complaints   OBJECTIVE:   Vitals:   Filed Vitals:   10/12/15 2329 10/13/15 0000 10/13/15 0400 10/13/15 0800  BP:   162/86 168/79  Pulse:   75 74  Temp:   98.8 F (37.1 C)   TempSrc:   Axillary   Resp: 24 16 19 19   Height:      Weight:      SpO2: 98% 100% 100% 96%   I&O's:   Intake/Output Summary (Last 24 hours) at 10/13/15 1014 Last data filed at 10/13/15 0900  Gross per 24 hour  Intake   3146 ml  Output   2575 ml  Net    571 ml   TELEMETRY: Reviewed telemetry pt in NSR with bursts of SVT     PHYSICAL EXAM General: Well developed, well nourished, in no acute distress Head: Eyes PERRLA, No xanthomas.   Normal cephalic and atramatic  Lungs:   Clear bilaterally to auscultation and percussion. Heart:   HRRR S1 S2 Pulses are 2+ & equal. Abdomen: Bowel sounds are positive, abdomen soft and non-tender without masses  Extremities:   No clubbing, cyanosis or edema.  DP +1 Neuro: Alert and oriented X 3. Psych:  Good affect, responds appropriately   LABS: Basic Metabolic Panel:  Recent Labs  10/11/15 0400 10/12/15 0402  NA 136 141  K 4.0 3.8  CL 106 106  CO2 23 27  GLUCOSE 113* 101*  BUN 15 14  CREATININE 1.08 0.97  CALCIUM 8.1* 8.5*   Liver Function Tests:  Recent Labs  10/12/15 0402  AST 23  ALT 13*  ALKPHOS 42  BILITOT 0.4  PROT 6.9  ALBUMIN 3.0*   No results for input(s): LIPASE, AMYLASE in the last 72 hours. CBC:  Recent Labs  10/11/15 0400 10/12/15 0402  WBC 8.7 7.7  HGB 10.1* 9.6*  HCT 32.0* 31.7*  MCV 67.8* 68.6*  PLT 164 171   Cardiac Enzymes: No results for input(s): CKTOTAL, CKMB, CKMBINDEX, TROPONINI in the last 72 hours. BNP: Invalid input(s): POCBNP D-Dimer: No results for input(s): DDIMER in the last 72 hours. Hemoglobin A1C: No results for input(s): HGBA1C in the last 72 hours. Fasting Lipid Panel: No results for input(s): CHOL, HDL, LDLCALC, TRIG, CHOLHDL, LDLDIRECT in the last 72 hours. Thyroid  Function Tests: No results for input(s): TSH, T4TOTAL, T3FREE, THYROIDAB in the last 72 hours.  Invalid input(s): FREET3 Anemia Panel: No results for input(s): VITAMINB12, FOLATE, FERRITIN, TIBC, IRON, RETICCTPCT in the last 72 hours. Coag Panel:   Lab Results  Component Value Date   INR 1.13 10/06/2015   INR 1.16 06/19/2015   INR 1.04 11/19/2011    RADIOLOGY: Ct Angio Head W/cm &/or Wo Cm  10/07/2015  CLINICAL DATA:  75 year old male with acute right hemisphere infarcts, mostly right MCA territory, following presentation for left lower extremity weakness. Initial encounter. EXAM: CT ANGIOGRAPHY HEAD AND NECK TECHNIQUE: Multidetector CT imaging of the head and neck was performed using the standard protocol during bolus administration of intravenous contrast. Multiplanar CT image reconstructions and MIPs were obtained to evaluate the vascular anatomy. Carotid stenosis measurements (when applicable) are obtained utilizing NASCET criteria, using the distal internal carotid diameter as the denominator. CONTRAST:  64mL OMNIPAQUE IOHEXOL 350 MG/ML SOLN COMPARISON:  Brain MRI 10/06/2015 and earlier, including intracranial MRA a 11/20/2011. FINDINGS: CTA NECK Skeleton: Intermittent poor dentition. Widespread chronic cervical spine disc and endplate degeneration. No acute osseous abnormality identified. Mild paranasal sinus mucosal thickening. That in the left  maxillary sinus might be associated with a small supernumerary tooth or tooth fragment (series 6, image 76). Tympanic cavities and mastoids appear clear. Other neck: Negative lung apices. No superior mediastinal lymphadenopathy. Thyroid, larynx, pharynx, parapharyngeal spaces, retropharyngeal space, sublingual space, submandibular glands, and parotid glands are within normal limits. Visualized orbits and scalp soft tissues are within normal limits. No cervical lymphadenopathy. Aortic arch: Bovine arch configuration. Mild to moderate arch atherosclerosis,  with both soft and calcified plaque. Great vessel dolichoectasia and tortuosity. Right carotid system: No brachiocephalic artery or right CCA origin stenosis. Tortuous proximal right CCA. Soft and calcified plaque at the right carotid bifurcation with high-grade string sign stenosis, with low-density plaque affecting the proximal 10 mm of the vessel. See sagittal image 57 of series 10. Despite this the right ICA remains patent. The vessel is mildly tortuous with a partially retropharyngeal course. Left carotid system: Bovine left CCA origin without stenosis. Mild calcified plaque at the left carotid bifurcation, widely patent left ICA origin and bulb. Tortuous but otherwise negative cervical left ICA, with a partially retropharyngeal course. Vertebral arteries:No proximal right subclavian artery stenosis despite calcified plaque. Calcified plaque near the right vertebral artery origin but without origin stenosis. However, there is soft plaque affecting the right V1 segment with moderate stenosis best seen on series 9, image 144. Occasionally tortuous right vertebral the 2 segment, and occasional calcified plaque but no other cervical right vertebral artery stenosis. No proximal left subclavian artery stenosis. Mildly dominant left vertebral artery with no origin stenosis despite soft plaque. V3 segment calcified plaque but no cervical left vertebral artery stenosis. CTA HEAD Posterior circulation: Bilateral calcified plaque affecting the vertebral arteries at the skullbase, however, a moderate to severe bilateral distal vertebral artery stenosis occurs due to soft plaque. Subsequent high-grade stenosis in both distal vertebral arteries and at the vertebrobasilar junction. Moderately to severely irregular basilar artery with additional tandem stenoses. However, the basilar remains patent. Severe irregularity and stenosis at both PCA origins and also affecting the SCA origins to a lesser extent. Posterior  communicating arteries might be present but are diminutive and irregular. Bilateral PCA branches are severely irregular and stenotic, worse on the right. Anterior circulation: Proximal ICA siphons are patent without plaque. However, there is moderate to severe calcified plaque from the cavernous segments to the supraclinoid ICAs. On the right this results in high-grade supraclinoid ICA stenosis best seen on series 8, image 111. There is moderate stenosis on the left. There is additional moderate to severe distal right ICA/terminus stenosis related to calcified plaque best seen on series 8, image 110. Despite these findings both carotid termini are patent. There is irregularity at both MCA and ACA origins which remain patent. Severe stenosis of the right A1. The anterior communicating artery is present. Moderate to severe bilateral A2 segment irregularity and multifocal stenoses, best seen on series 13, image 18. Left MCA origin is patent with mild irregularity. Mild left M1 irregularity, with moderate Left MCA bifurcation is patent multifocal proximal M2 branch irregularity and stenosis best seen on series 11, image 22. On the right the MCA origin is mildly stenotic. The M1 segment is mildly to moderately irregular. The right MCA bifurcation is. There is moderate multifocal irregularity and stenosis in the dominant anterior M2 branch with attenuated anterior division MCA enhancement. Mild posterior division vessel irregularity. Venous sinuses: Patent. Anatomic variants: Bovine arch configuration. Delayed phase: No abnormal enhancement identified. Recently seen scattered small right MCA infarcts remain largely occult on CT. No acute  intracranial hemorrhage identified. No intracranial mass effect. IMPRESSION: 1. Radiographic String Sign Stenosis at the right ICA origin related to Low-density plaque or thrombus, however, there is superimposed severe intracranial atherosclerosis including high-grade distal right ICA  stenosis in part due to bulky calcified plaque. 2. Widespread intracranial moderate to severe first and second order vessel irregularity and stenosis, including severe stenoses at the: -bilateral distal vertebral arteries, - proximal basilar artery, - bilateral PCAs, - right ACA A1, - bilateral ACA distal A2 segments. 3. No intracranial hemorrhage or mass effect. Salient findings discussed by telephone with Neurology NP Burnetta Sabin on 10/07/2015 at 1142 hours. Electronically Signed   By: Genevie Ann M.D.   On: 10/07/2015 11:45   Ct Head Wo Contrast  10/11/2015  CLINICAL DATA:  Code stroke.  Slurred speech. EXAM: CT HEAD WITHOUT CONTRAST TECHNIQUE: Contiguous axial images were obtained from the base of the skull through the vertex without intravenous contrast. COMPARISON:  10/06/2015 FINDINGS: Mild generalized age related volume loss. Mild chronic small-vessel ischemic change of the deep white matter. No CT evidence of acute infarction, mass lesion, hemorrhage, hydrocephalus or extra-axial collection. Question slight hyperdensity in the right MCA, not definite. Calvarium is unremarkable. Sinuses are clear. There is atherosclerotic calcification of the major vessels at the base of the brain. IMPRESSION: No acute finding by CT. These results were called by telephone at the time of interpretation on 10/11/2015 at 4:00 pm to Dr. Wallie Char , who verbally acknowledged these results. Electronically Signed   By: Nelson Chimes M.D.   On: 10/11/2015 16:00   Ct Head Wo Contrast  10/06/2015  CLINICAL DATA:  Left leg weakness. History of left MCA territory infarct. EXAM: CT HEAD WITHOUT CONTRAST TECHNIQUE: Contiguous axial images were obtained from the base of the skull through the vertex without intravenous contrast. COMPARISON:  Brain MR 11/20/2011.  CT 11/19/2011. FINDINGS: Sinuses/Soft tissues: Mucosal thickening of left maxillary sinus ain ethmoid air cells. Clear mastoid air cells. Intracranial: Mild low density in the  periventricular white matter likely related to small vessel disease. Cerebral and cerebellar atrophy. Vertebral and carotid atherosclerosis bilaterally. No mass lesion, hemorrhage, hydrocephalus, acute infarct, intra-axial, or extra-axial fluid collection. IMPRESSION: 1.  No acute intracranial abnormality. 2.  Cerebral atrophy and small vessel ischemic change. 3. Sinus disease. Electronically Signed   By: Abigail Miyamoto M.D.   On: 10/06/2015 17:15   Ct Angio Neck W/cm &/or Wo/cm  10/07/2015  CLINICAL DATA:  75 year old male with acute right hemisphere infarcts, mostly right MCA territory, following presentation for left lower extremity weakness. Initial encounter. EXAM: CT ANGIOGRAPHY HEAD AND NECK TECHNIQUE: Multidetector CT imaging of the head and neck was performed using the standard protocol during bolus administration of intravenous contrast. Multiplanar CT image reconstructions and MIPs were obtained to evaluate the vascular anatomy. Carotid stenosis measurements (when applicable) are obtained utilizing NASCET criteria, using the distal internal carotid diameter as the denominator. CONTRAST:  47mL OMNIPAQUE IOHEXOL 350 MG/ML SOLN COMPARISON:  Brain MRI 10/06/2015 and earlier, including intracranial MRA a 11/20/2011. FINDINGS: CTA NECK Skeleton: Intermittent poor dentition. Widespread chronic cervical spine disc and endplate degeneration. No acute osseous abnormality identified. Mild paranasal sinus mucosal thickening. That in the left maxillary sinus might be associated with a small supernumerary tooth or tooth fragment (series 6, image 76). Tympanic cavities and mastoids appear clear. Other neck: Negative lung apices. No superior mediastinal lymphadenopathy. Thyroid, larynx, pharynx, parapharyngeal spaces, retropharyngeal space, sublingual space, submandibular glands, and parotid glands are within normal limits.  Visualized orbits and scalp soft tissues are within normal limits. No cervical lymphadenopathy.  Aortic arch: Bovine arch configuration. Mild to moderate arch atherosclerosis, with both soft and calcified plaque. Great vessel dolichoectasia and tortuosity. Right carotid system: No brachiocephalic artery or right CCA origin stenosis. Tortuous proximal right CCA. Soft and calcified plaque at the right carotid bifurcation with high-grade string sign stenosis, with low-density plaque affecting the proximal 10 mm of the vessel. See sagittal image 57 of series 10. Despite this the right ICA remains patent. The vessel is mildly tortuous with a partially retropharyngeal course. Left carotid system: Bovine left CCA origin without stenosis. Mild calcified plaque at the left carotid bifurcation, widely patent left ICA origin and bulb. Tortuous but otherwise negative cervical left ICA, with a partially retropharyngeal course. Vertebral arteries:No proximal right subclavian artery stenosis despite calcified plaque. Calcified plaque near the right vertebral artery origin but without origin stenosis. However, there is soft plaque affecting the right V1 segment with moderate stenosis best seen on series 9, image 144. Occasionally tortuous right vertebral the 2 segment, and occasional calcified plaque but no other cervical right vertebral artery stenosis. No proximal left subclavian artery stenosis. Mildly dominant left vertebral artery with no origin stenosis despite soft plaque. V3 segment calcified plaque but no cervical left vertebral artery stenosis. CTA HEAD Posterior circulation: Bilateral calcified plaque affecting the vertebral arteries at the skullbase, however, a moderate to severe bilateral distal vertebral artery stenosis occurs due to soft plaque. Subsequent high-grade stenosis in both distal vertebral arteries and at the vertebrobasilar junction. Moderately to severely irregular basilar artery with additional tandem stenoses. However, the basilar remains patent. Severe irregularity and stenosis at both PCA  origins and also affecting the SCA origins to a lesser extent. Posterior communicating arteries might be present but are diminutive and irregular. Bilateral PCA branches are severely irregular and stenotic, worse on the right. Anterior circulation: Proximal ICA siphons are patent without plaque. However, there is moderate to severe calcified plaque from the cavernous segments to the supraclinoid ICAs. On the right this results in high-grade supraclinoid ICA stenosis best seen on series 8, image 111. There is moderate stenosis on the left. There is additional moderate to severe distal right ICA/terminus stenosis related to calcified plaque best seen on series 8, image 110. Despite these findings both carotid termini are patent. There is irregularity at both MCA and ACA origins which remain patent. Severe stenosis of the right A1. The anterior communicating artery is present. Moderate to severe bilateral A2 segment irregularity and multifocal stenoses, best seen on series 13, image 18. Left MCA origin is patent with mild irregularity. Mild left M1 irregularity, with moderate Left MCA bifurcation is patent multifocal proximal M2 branch irregularity and stenosis best seen on series 11, image 22. On the right the MCA origin is mildly stenotic. The M1 segment is mildly to moderately irregular. The right MCA bifurcation is. There is moderate multifocal irregularity and stenosis in the dominant anterior M2 branch with attenuated anterior division MCA enhancement. Mild posterior division vessel irregularity. Venous sinuses: Patent. Anatomic variants: Bovine arch configuration. Delayed phase: No abnormal enhancement identified. Recently seen scattered small right MCA infarcts remain largely occult on CT. No acute intracranial hemorrhage identified. No intracranial mass effect. IMPRESSION: 1. Radiographic String Sign Stenosis at the right ICA origin related to Low-density plaque or thrombus, however, there is superimposed  severe intracranial atherosclerosis including high-grade distal right ICA stenosis in part due to bulky calcified plaque. 2. Widespread intracranial moderate to  severe first and second order vessel irregularity and stenosis, including severe stenoses at the: -bilateral distal vertebral arteries, - proximal basilar artery, - bilateral PCAs, - right ACA A1, - bilateral ACA distal A2 segments. 3. No intracranial hemorrhage or mass effect. Salient findings discussed by telephone with Neurology NP Burnetta Sabin on 10/07/2015 at 1142 hours. Electronically Signed   By: Genevie Ann M.D.   On: 10/07/2015 11:45   Mr Brain Wo Contrast  10/11/2015  CLINICAL DATA:  Acute encephalopathy. Episode of altered mental status in the bathroom earlier today. EXAM: MRI HEAD WITHOUT CONTRAST TECHNIQUE: Multiplanar, multiecho pulse sequences of the brain and surrounding structures were obtained without intravenous contrast. COMPARISON:  Head CT 10/11/2015 and MRI 10/06/2015 FINDINGS: Multiple small foci of restricted diffusion are again seen in the right cerebral hemisphere involving cortex and white matter. Some of these have decreased in conspicuity compared to the prior MRI, however there are scattered areas of acute infarction which are new or slightly larger than on the prior study. These have been marked with arrows on axial diffusion series 4 and are present in the right parietal cortex, right centrum semiovale and corona radiata, anterior right frontal lobe, right occipital cortex and white matter, and right temporal lobe. There is no evidence of intracranial hemorrhage, mass, midline shift, or extra-axial fluid collection. There is mild cerebral atrophy. Patchy T2 hyperintensities in the cerebral white matter bilaterally are compatible with moderate chronic small vessel ischemic disease. Chronic lacunar infarcts are noted in the thalami and deep cerebral white matter bilaterally. Orbits are unremarkable. Mild paranasal sinus mucosal  thickening is noted. Mastoid air cells are clear. Major intracranial vascular flow voids are preserved, with evidence of distal right ICA stenosis better evaluated on recent CTA. IMPRESSION: 1. Evolving, small acute infarcts throughout the right cerebral hemisphere with some areas of new infarction, likely watershed ischemia. 2. Moderate chronic small vessel ischemic disease. Electronically Signed   By: Logan Bores M.D.   On: 10/11/2015 20:49   Mr Brain Wo Contrast  10/06/2015  CLINICAL DATA:  Initial evaluation for acute left leg weakness. EXAM: MRI HEAD WITHOUT CONTRAST TECHNIQUE: Multiplanar, multiecho pulse sequences of the brain and surrounding structures were obtained without intravenous contrast. COMPARISON:  Prior CT from earlier the same day. FINDINGS: There are multi focal patchy areas of restricted diffusion involving the cortical gray matter and subcortical white matter of the right frontal and parietal lobes. Few scattered small foci of patchy ischemic infarcts present within the right occipital lobe as well. These primarily involve the cortical gray matter and deep white matter of the centrum semi ovale. No basal ganglia infarct. No associated hemorrhage or significant mass effect. No other infarct involving the left cerebral hemisphere or infratentorial brain. Circumferential FLAIR signal within the visualized distal cervical/petrous right ICA suggestive of possible slow flow and/or partial occlusion or atheromatous disease. Major intracranial vascular flow voids otherwise maintained. Diffuse prominence of the CSF containing spaces compatible with generalized cerebral atrophy. Patchy T2/FLAIR hyperintensity within the periventricular and deep white matter both cerebral hemispheres most consistent with chronic small vessel ischemic disease, moderate in nature. Small vessel type changes present within the pons. Few scattered small remote lacunar infarcts within the bilateral corona radiata. Small  remote lacunar infarct within the left thalamus. No mass lesion, midline shift, or mass effect. No hydrocephalus. No extra-axial fluid collection. Major dural sinuses are grossly patent. Craniocervical junction within normal limits. Mild degenerative spondylolysis present within the upper cervical spine without significant stenosis. Pituitary  gland normal.  No acute abnormality about the orbits. Scattered mucosal thickening within the paranasal sinuses. Small air-fluid level within the left maxillary sinuses. No mastoid effusion. Inner ear structures within normal limits. Bone marrow signal intensity normal. No scalp soft tissue abnormality. IMPRESSION: 1. Patchy acute ischemic nonhemorrhagic infarcts involving the cortical gray matter and deep white matter of the right frontal, right parietal, and right occipital lobes. No associated mass effect. 2. Subtle abnormal flow void within the distal right ICA, which may reflect underlying atheromatous disease, slow flow, and/or partial occlusion. 3. Generalized cerebral atrophy with moderate chronic small vessel ischemic disease. 4. Mild paranasal sinus disease as above. Electronically Signed   By: Jeannine Boga M.D.   On: 10/06/2015 22:30   Ct Coronary Morp W/cta Cor W/score W/ca W/cm &/or Wo/cm  10/10/2015  ADDENDUM REPORT: 10/10/2015 10:55 CLINICAL DATA:  Chest pain EXAM: Cardiac/Coronary  CT TECHNIQUE: The patient was scanned on a Philips 256 scanner. FINDINGS: A 120 kV prospective scan was triggered in the descending thoracic aorta at 111 HU's. Axial non-contrast 3 mm slices were carried out through the heart. The data set was analyzed on a dedicated work station and scored using the Advance. Gantry rotation speed was 270 msecs and collimation was .9 mm. 5 mg of iv Metoprolol and 0.8 mg of sl NTG was given. The 3D data set was reconstructed in 5% intervals of the 67-82 % of the R-R cycle. Diastolic phases were analyzed on a dedicated work station  using MPR, MIP and VRT modes. The patient received 80 cc of contrast. Aorta:  Normal size, minimal calcifications, no dissection. Aortic Valve:  Trileaflet, minimal calcifications. Coronary Arteries: Normal origin.  Left dominance. Left main is a large artery that has mild noncalcified plaque in the proximal segment and mild calcified plaque in the distal portion with associated stenosis 25-50%. LAD is a large artery that has moderate diffuse mixed, predominantly calcified plaque in the proximal and mid segments with associated stenosis 50-69%. Distal LAD has only mild plaque. The first diagonal branch has only minimal plaque, the second diagonal branch has mild calcified ostial plaque. LCX artery has diffuse moderate predominantly calcified plaque in its mid portion with associated stenosis 50-69%. There is a focal spot at the distal LCX artery suspicious of stenosis close to 70%. PLA and PDA have minimal plaque. RCA is a very small non-dominant artery with no obvious plaque. IMPRESSION: 1. Coronary calcium score of 980. This was 94 percentile for age and sex matched control. 2. Normal coronary origin with left dominance. 3. Diffuse moderate plaque in the LAD and LCX arteries. An aggressive medical therapy is recommended. Ena Dawley Electronically Signed   By: Ena Dawley   On: 10/10/2015 10:55  10/10/2015  EXAM: OVER-READ INTERPRETATION  CT CHEST The following report is an over-read performed by radiologist Dr. Rebekah [facility] on 10/10/2015. This over-read does not include interpretation of cardiac or coronary anatomy or pathology. The coronary calcium score/coronary CTA interpretation by the cardiologist is attached. COMPARISON:  No priors. FINDINGS: In the medial aspect of the left lower lobe there is a 12 x 15 mm area of nodular architectural distortion (image 56 of series 304). This is somewhat similar in retrospect to remote prior study 06/09/2007, although slightly more  bulky. 6 mm left upper lobe pulmonary nodule (image 29 of series 304). 3 mm left upper lobe nodule (image 39 of series 304). 11 x 7 mm nodule in the inferior aspect of the  right upper lobe abutting the minor fissure (image 24 of series 304). No acute consolidative airspace disease. Trace bilateral pleural effusions lying dependently. Areas of ground-glass attenuation and mild interlobular septal thickening are noted throughout the lungs bilaterally, suggesting a background of mild interstitial pulmonary edema. No definite lymphadenopathy within the visualized thorax. Visualized portions of the upper abdomen are remarkable for atherosclerosis. There are no aggressive appearing lytic or blastic lesions noted in the visualized portions of the skeleton. IMPRESSION: 1. Multiple pulmonary nodules scattered throughout the lungs bilaterally. Although the largest of these has been present on prior study dating back to 2008, it does appear slightly more bulky. This largest nodule is favored to represent an area of scarring, however, there are several other nodules that warrant attention on followup studies, and further evaluation with PET-CT is suggested at this time. 2. Evidence of mild interstitial pulmonary edema. 3. Trace bilateral pleural effusions bilaterally. These results will be called to the ordering clinician or representative by the Radiologist Assistant, and communication documented in the PACS or zVision Dashboard. Electronically Signed: By: Vinnie Langton M.D. On: 10/10/2015 09:57   Ir Angio Intra Extracran Sel Com Carotid Innominate Bilat Mod Sed  10/08/2015  CLINICAL DATA:  Left-sided weakness, leg greater than arm. Abnormal CT angiogram of the brain and neck. EXAM: BILATERAL COMMON CAROTID AND INNOMINATE ANGIOGRAPHY AND BILATERAL VERTEBRAL ARTERY ANGIOGRAMS PROCEDURE: Contrast:  60 ml 17mL OMNIPAQUE IOHEXOL 300 MG/ML  SOLN Anesthesia/Sedation:  Conscious sedation. Medications: Versed at 1 mg IV.   Fentanyl 25 mcg IV. Following a full explanation of the procedure along with the potential associated complications, an informed witnessed consent was obtained. The right groin was prepped and draped in the usual sterile fashion. Thereafter using modified Seldinger technique, transfemoral access into the right common femoral artery was obtained without difficulty. Over a 0.035 inch guidewire, a 5 French Pinnacle sheath was inserted. Through this, and also over 0.035 inch guidewire, a 5 [place] JB 1 catheter was advanced to the aortic arch region and selectively positioned in the right common carotid artery, the right subclavian artery, the left common carotid artery and the left subclavian artery. There were no acute complications. The patient tolerated the procedure well. FINDINGS: The right common carotid arteriogram demonstrates the right external carotid artery to be mildly narrowed proximal. Its branches are normally opacified otherwise. The right internal carotid artery has severe pre occlusive stenosis on the lateral, likely a string sign with mild poststenotic dilatation. There is mild tortuosity of the junction of the proximal 1/3 and middle 1/3 of the cervical right ICA. More distally, the vessel is seen to opacify normally to the cranial skull base. The petrous and the proximal cavernous segments are widely patent. There is an approximately 50% stenosis of the caval cavernous segment, with approximately 80-85% narrowing of the supraclinoid right ICA. The right middle cerebral artery distribution is seen to opacify into the capillary and the venous phases. Focal areas of caliber narrowing with irregularity represent intracranial arteriosclerotic disease. There is no angiographic visualization of the right anterior cerebral A1 segment. The right vertebral artery origin is patent. The vessel is seen to opacify to the cranial skull base where it opacifies the left posterior-inferior cerebellar artery and the  left vertebrobasilar junction. Flow is seen into the distal basilar artery, the posterior cerebral arteries and superior cerebellar arteries. Extreme attenuation of the contrast in the proximal basilar artery is seen. The left vertebral artery origin is normal. The vessel is seen to opacify to  the cranial skull base. There is an approximately 50% narrowing of the left vertebrobasilar junction just proximal to the left posterior-inferior cerebellar artery. High-grade 75 plus stenosis seen of the left vertebrobasilar junction just distal to the origin of the left posterior inferior cerebellar artery. Additionally there is approximately 70% narrowing of the proximal basilar artery. The visualized posterior cerebral arteries, superior cerebellar arteries and the anterior-inferior cerebellar arteries including the left posterior inferior cerebellar artery demonstrated focal areas of caliber narrowing with irregularity most consistent with intracranial arteriosclerosis. The left common carotid arteriogram demonstrates the left external carotid artery and its major branches to be widely patent. The left internal carotid artery at the bulb demonstrates minimal plaque without associated ulcerations. Mild tortuosity is seen of the junction of the middle and the proximal 1/3 of the left internal carotid artery. More distally the vessel is seen to opacify to the cranial skull base. The petrous segment is widely patent. The cavernous segment demonstrates mild focal areas of caliber irregularity with approximately 50% narrowing of the distal cavernous segment. The supraclinoid segment appears widely patent. The left middle and the left anterior cerebral arteries are seen to opacify into the capillary and the venous phases. Scattered focal areas of caliber irregularity with narrowing suggest intracranial arteriosclerosis. Also seen is simultaneous opacification via the anterior communicating artery of the right anterior cerebral  A2 segment and also an attenuated distal right A1 segment. There appears to be contrast leaking into the right middle cerebral artery at the proximal A1 segment. The right A2 segment demonstrates focal areas of caliber irregularity consistent with intracranial arteriosclerosis. 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 Approximately 85% narrowing of the right internal carotid artery supraclinoid segment, and 50% narrowing of the right internal carotid artery cavernous segment. Approximately 75% stenosis of the left vertebrobasilar junction just distal to the origin of the left posterior-inferior cerebellar artery, and approximately 70% narrowing of the proximal basilar artery. Approximately 50% narrowing of the left internal carotid artery cavernous segment. 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 Electronically Signed   By: Luanne Bras M.D.   On: 10/08/2015 11:34   Ir Angio Vertebral Sel Subclavian Innominate Bilat Mod Sed  10/08/2015  CLINICAL DATA:  Left-sided weakness, leg greater than arm. Abnormal CT angiogram of the brain and neck. EXAM: BILATERAL COMMON CAROTID AND INNOMINATE ANGIOGRAPHY AND BILATERAL VERTEBRAL ARTERY ANGIOGRAMS PROCEDURE: Contrast:  60 ml 36mL OMNIPAQUE IOHEXOL 300 MG/ML  SOLN Anesthesia/Sedation:  Conscious sedation. Medications: Versed at 1 mg IV.  Fentanyl 25 mcg IV. Following a full explanation of the procedure along with the potential associated complications, an informed witnessed consent was obtained. The right groin was prepped and draped in the usual sterile fashion. Thereafter using modified Seldinger technique, transfemoral access into the right common femoral artery was obtained without difficulty. Over a 0.035 inch guidewire, a 5 French Pinnacle sheath was inserted. Through this, and also over 0.035 inch guidewire, a 5 [place] JB 1 catheter was advanced to the aortic  arch region and selectively positioned in the right common carotid artery, the right subclavian artery, the left common carotid artery and the left subclavian artery. There were no acute complications. The patient tolerated the procedure well. FINDINGS: The right common carotid arteriogram demonstrates the right external carotid artery to be mildly narrowed proximal. Its branches are normally opacified otherwise. The right internal carotid artery has severe pre occlusive stenosis on the lateral, likely a string sign  with mild poststenotic dilatation. There is mild tortuosity of the junction of the proximal 1/3 and middle 1/3 of the cervical right ICA. More distally, the vessel is seen to opacify normally to the cranial skull base. The petrous and the proximal cavernous segments are widely patent. There is an approximately 50% stenosis of the caval cavernous segment, with approximately 80-85% narrowing of the supraclinoid right ICA. The right middle cerebral artery distribution is seen to opacify into the capillary and the venous phases. Focal areas of caliber narrowing with irregularity represent intracranial arteriosclerotic disease. There is no angiographic visualization of the right anterior cerebral A1 segment. The right vertebral artery origin is patent. The vessel is seen to opacify to the cranial skull base where it opacifies the left posterior-inferior cerebellar artery and the left vertebrobasilar junction. Flow is seen into the distal basilar artery, the posterior cerebral arteries and superior cerebellar arteries. Extreme attenuation of the contrast in the proximal basilar artery is seen. The left vertebral artery origin is normal. The vessel is seen to opacify to the cranial skull base. There is an approximately 50% narrowing of the left vertebrobasilar junction just proximal to the left posterior-inferior cerebellar artery. High-grade 75 plus stenosis seen of the left vertebrobasilar junction just  distal to the origin of the left posterior inferior cerebellar artery. Additionally there is approximately 70% narrowing of the proximal basilar artery. The visualized posterior cerebral arteries, superior cerebellar arteries and the anterior-inferior cerebellar arteries including the left posterior inferior cerebellar artery demonstrated focal areas of caliber narrowing with irregularity most consistent with intracranial arteriosclerosis. The left common carotid arteriogram demonstrates the left external carotid artery and its major branches to be widely patent. The left internal carotid artery at the bulb demonstrates minimal plaque without associated ulcerations. Mild tortuosity is seen of the junction of the middle and the proximal 1/3 of the left internal carotid artery. More distally the vessel is seen to opacify to the cranial skull base. The petrous segment is widely patent. The cavernous segment demonstrates mild focal areas of caliber irregularity with approximately 50% narrowing of the distal cavernous segment. The supraclinoid segment appears widely patent. The left middle and the left anterior cerebral arteries are seen to opacify into the capillary and the venous phases. Scattered focal areas of caliber irregularity with narrowing suggest intracranial arteriosclerosis. Also seen is simultaneous opacification via the anterior communicating artery of the right anterior cerebral A2 segment and also an attenuated distal right A1 segment. There appears to be contrast leaking into the right middle cerebral artery at the proximal A1 segment. The right A2 segment demonstrates focal areas of caliber irregularity consistent with intracranial arteriosclerosis. 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 Approximately 85% narrowing of the right internal carotid artery supraclinoid segment, and 50% narrowing of the right internal carotid  artery cavernous segment. Approximately 75% stenosis of the left vertebrobasilar junction just distal to the origin of the left posterior-inferior cerebellar artery, and approximately 70% narrowing of the proximal basilar artery. Approximately 50% narrowing of the left internal carotid artery cavernous segment. 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 Electronically Signed   By: Luanne Bras M.D.   On: 10/08/2015 11:34    ASSESSMENT/PLAN:  1. 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 S/P right CEA POD #3.   2. 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 Coronary calcium score 980 (81% for age) with diffuse moderate plaque in LAD and LCX with small non dominant RCA with no plaque. Needs aggressive risk factor modification. HbgA1C mildly elevated so needs followup with PCP as outpt. LDL markedly elevated despite high dose statin. Zetia added this admission. Needs aggressive BP management. Restart Imdur when and ifok with surgery and Neuro. Continue ASA/statin/BB. Need to d/c Cialis since he is on long acting nitrates at home.   3. HTN - improved but remains elevated. Continue amlodipine and coreg and consider restarting home dose of ACE I. Allowing permissible hypertension.  4. Dyslipidemia - LDL poorly controlled at 206 despite high dose statin. ? Whether he has been taking his statin at home. Zetia 10mg  daily added. Will get FLP in 8 weeks and if still elevated then refer to lipid clinic for consideration of [facility] drug.   5. PAF - he was not on longterm anticoagulation at home for unclear reasons. This patients CHA2DS2-VASc Score and unadjusted Ischemic Stroke Rate (% per year) is equal to 9.8 % stroke rate/year from a score of 6. Above score calculated as 1 point each if present [CHF, HTN, DM, Vascular=MI/PAD/Aortic  Plaque, Age if 65-74, or Male]. Above score calculated as 2 points each if present [Age > 75, or Stroke/TIA/TE]. Now on Eliquis. Now having short runs of SVT but asymptomatic. Will continue BB. Recommed increasing Coreg dose to suppress bursts of SVT (If OK with Neuro since trying to maintain higher BP). Neuro wants BP running on the high side so may need to stop amlodipine if more beta blockade needed.  6. Acute ischemic CVA - MRI brain shows patchy right frontal, parietal and occipital infarcts. CT angiogram of the head and neck showed high-grade right ICA stenosis with bulky calcified plaque. S/P right CEA. Yesterday patient became confused and had a near-syncopal event while in the bathroom. He was found to have slurred speech. Code stroke was called. Head CT done was unremarkable. MRI of the brain showed evolving small acute infarcts without right cerebral hemisphere with likely watershed ischemia. Neuro stated that this can be seen following CEA and appears this could be a vasovagal or orthostatic event. No new neurological deficit on exam.    Sueanne Margarita, MD  10/13/2015  10:14 AM

## 2015-10-13 NOTE — Clinical Social Work Note (Signed)
Clinical Social Work Assessment  Patient Details  Name: [name] MRN: [id number] Date of Birth: June 28, 1941  Date of referral:  10/13/15               Reason for consult:  Facility Placement                Permission sought to share information with:  Case Manager Permission granted to share information::  No  Name::        Agency::     Relationship::     Contact Information:     Housing/Transportation Living arrangements for the past 2 months:  Mobile Home Source of Information:  Patient Patient Interpreter Needed:  None Criminal Activity/Legal Involvement Pertinent to Current Situation/Hospitalization:  No - Comment as needed Significant Relationships:  None Lives with:  Self Do you feel safe going back to the place where you live?  Yes Need for family participation in patient care:  Yes (Comment)  Care giving concerns:  None reported at this time.    Social Worker assessment / plan:  CSW went to speak with patient regarding possible need for short term rehab if CIR does not approve. CSW introduced self and acknowledged the patient. Patient was alert and oriented. Patient reports he lives alone, denies any substance abuse or mental health issues. Patient is aware that we will explore SNF placement as a back up plan. Patient is agreeable with plan. Patient provided CSW with permission to send his clinical information out to the facilities in [facility]. CSW provided patient with a list of SNF's in the area and CSW contact information. CSW to complete FL2 for MD signature. CSW will also initiate SNF placement process.   Employment status:  Disabled (Comment on whether or not currently receiving Disability) Insurance information:  Managed Medicare PT Recommendations:  Inpatient Rehab Consult Information / Referral to community resources:  [place]  Patient/Family's Response to care: Patient is agreeable with SNF as a back up if not approved for CIR.    Patient/Family's Understanding of and Emotional Response to Diagnosis, Current Treatment, and Prognosis:  Patient is aware and understanding of current treatment and prognosis. Patient was very appreciative of CSW services and intervention.   Emotional Assessment Appearance:  Appears stated age Attitude/Demeanor/Rapport:   (Calm and Cooperative ) Affect (typically observed):  Accepting, Appropriate Orientation:  Oriented to Self, Oriented to Place, Oriented to  Time, Oriented to Situation Alcohol / Substance use:  Not Applicable Psych involvement (Current and /or in the community):  No (Comment)  Discharge Needs  Concerns to be addressed:  Discharge Planning Concerns Readmission within the last 30 days:  No Current discharge risk:  Physical Impairment Barriers to Discharge:  Continued Medical Work up   [employer], LCSW 10/13/2015, 4:26 PM

## 2015-10-13 NOTE — Progress Notes (Signed)
   Daily Progress Note  Assessment/Planning: POD #3 s/p R CEA complicated by R hemispheric infarct   No further neuro events  Dr. Trula Slade will be back tomorrow  Subjective  - 3 Days Post-Op  No events, no complaints, wants to shave  Objective Filed Vitals:   10/12/15 2329 10/13/15 0000 10/13/15 0400 10/13/15 0800  BP:   162/86 168/79  Pulse:   75 74  Temp:   98.8 F (37.1 C)   TempSrc:   Axillary   Resp: 24 16 19 19   Height:      Weight:      SpO2: 98% 100% 100% 96%    Intake/Output Summary (Last 24 hours) at 10/13/15 1059 Last data filed at 10/13/15 0900  Gross per 24 hour  Intake   3146 ml  Output   2575 ml  Net    571 ml    NECK no hematoma, inc c/d/i NEURO sym motor 5/5, midline tongue  Laboratory CBC    Component Value Date/Time   WBC 7.7 10/12/2015 0402   HGB 9.6* 10/12/2015 0402   HCT 31.7* 10/12/2015 0402   PLT 171 10/12/2015 0402    BMET    Component Value Date/Time   NA 141 10/12/2015 0402   K 3.8 10/12/2015 0402   CL 106 10/12/2015 0402   CO2 27 10/12/2015 0402   GLUCOSE 101* 10/12/2015 0402   BUN 14 10/12/2015 0402   CREATININE 0.97 10/12/2015 0402   CALCIUM 8.5* 10/12/2015 0402   GFRNONAA >60 10/12/2015 0402   GFRAA >60 10/12/2015 0402    Adele Barthel, MD [facility]: [phone] Pager: [phone]  10/13/2015, 10:59 AM

## 2015-10-13 NOTE — NC FL2 (Signed)
[place] LEVEL OF CARE SCREENING TOOL     IDENTIFICATION  Patient Name: [name] Birthdate: 27-Sep-1940 Sex: male Admission Date (Current Location): 10/06/2015  [facility] and [place] Number:  Herbalist and Address:  [facility] [place], [place] [address] [place],  [address]      Provider Number: [id number]  Attending Physician Name and Address:  Louellen Molder, MD  Relative Name and Phone Number:       Current Level of Care: Hospital Recommended Level of Care: [place] Prior Approval Number:    Date Approved/Denied: 10/12/15 PASRR Number: [id number]  Discharge Plan: SNF    Current Diagnoses: Patient Active Problem List   Diagnosis Date Noted  . Benign essential HTN   . HLD (hyperlipidemia)   . Tachycardia   . Acute blood loss anemia   . Near syncope   . Preoperative cardiovascular examination 10/09/2015  . Prediabetes 10/08/2015  . Right cavernous carotid stenosis   . Carotid stenosis   . Acute ischemic stroke ([place]) 10/07/2015  . Acute CVA (cerebrovascular accident) ([place]) 10/06/2015  . Chest pain 06/19/2015  . DOE (dyspnea on exertion) 06/19/2015  . Hypertensive urgency 06/19/2015  . Wheezing   . Bilateral arm pain 06/26/2014  . 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 Impingement syndrome of left shoulder 01/20/2013  . Atrial fibrillation ([place]) 04/18/2012  . Leg pain 01/28/2012  . Dizziness and giddiness 11/19/2011  . Dehydration 11/19/2011  . CKD (chronic kidney disease), stage III 11/19/2011  . HTN (hypertension) 11/19/2011  . Dyslipidemia 11/19/2011  . PAF (paroxysmal atrial fibrillation) ([place]) 11/19/2011  . Erectile dysfunction 08/20/2011  . Nocturia 08/20/2011  . Preventative health care 08/15/2011  . Anemia, iron deficiency 08/15/2011  . History of pneumonia 08/15/2011  . CAD (coronary artery disease) 08/15/2011  . History of MI (myocardial infarction) 08/15/2011  . 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 Syncope 07/16/2011  . Claudication ([place]) 07/16/2011  . BPH (benign prostatic hyperplasia) 07/16/2011  . Rash 04/29/2011  . CVA (cerebral infarction) 03/31/2011  . Inguinal hernia 03/31/2011    Orientation RESPIRATION BLADDER Height & Weight     Self, Time, Situation, Place  Normal Continent Weight: 172 lb 9.6 oz (78.291 kg) Height:  5\' 6"  (167.6 cm)  BEHAVIORAL SYMPTOMS/MOOD NEUROLOGICAL BOWEL NUTRITION STATUS      Continent Diet  AMBULATORY STATUS COMMUNICATION OF NEEDS Skin   Limited Assist Verbally Other (Comment) (incisions)                       Personal Care Assistance Level of Assistance  Bathing, Feeding, Dressing Bathing Assistance: Limited assistance Feeding assistance: Independent Dressing Assistance: Limited assistance     Functional Limitations Info  Sight, Hearing, Speech Sight Info: Adequate Hearing Info: Adequate Speech Info: Adequate    SPECIAL CARE FACTORS FREQUENCY  PT (By licensed PT)                    Contractures      Additional Factors Info  Code Status, Allergies Code Status Info: FULL Allergies Info: No known allergies           Current Medications (10/13/2015):  This is the current hospital active medication list Current Facility-Administered Medications  Medication Dose Route Frequency Provider Last Rate Last Dose  . 0.9 %  sodium chloride infusion  250 mL Intravenous PRN [name], MD      . 0.9 %  sodium  chloride infusion  500 mL Intravenous Once PRN Samantha J Rhyne, PA-C      . 0.9 %  sodium chloride infusion   Intravenous Continuous Hulen Shouts Rhyne, PA-C   Stopped at 10/11/15 0700  . 0.9 %  sodium chloride infusion   Intravenous Continuous Wallie Char 100 mL/hr at 10/13/15 1600    . acetaminophen (TYLENOL) tablet 325-650 mg  325-650 mg Oral Q4H PRN Samantha J Rhyne, PA-C       Or  . acetaminophen (TYLENOL) suppository 325-650 mg  325-650 mg Rectal Q4H PRN Samantha J Rhyne, PA-C       . albuterol (PROVENTIL) (2.5 MG/3ML) 0.083% nebulizer solution 2.5 mg  2.5 mg Inhalation Q6H PRN [name], MD      . alum & mag hydroxide-simeth (MAALOX/MYLANTA) 200-200-20 MG/5ML suspension 15-30 mL  15-30 mL Oral Q2H PRN Samantha J Rhyne, PA-C      . amLODipine (NORVASC) tablet 10 mg  10 mg Oral Daily Nishant Dhungel, MD   10 mg at 10/13/15 0936  . apixaban (ELIQUIS) tablet 5 mg  5 mg Oral BID Theone Murdoch Hammons, RPH   5 mg at 10/13/15 0936  . aspirin chewable tablet 81 mg  81 mg Oral Daily Nishant Dhungel, MD   81 mg at 10/13/15 0936  . atorvastatin (LIPITOR) tablet 80 mg  80 mg Oral Daily [name], MD   80 mg at 10/13/15 0936  . bisacodyl (DULCOLAX) suppository 10 mg  10 mg Rectal Daily PRN Samantha J Rhyne, PA-C      . carvedilol (COREG) tablet 3.125 mg  3.125 mg Oral BID WC Nishant Dhungel, MD   3.125 mg at 10/13/15 0936  . docusate sodium (COLACE) capsule 100 mg  100 mg Oral Daily Hulen Shouts Rhyne, PA-C   100 mg at 10/13/15 0936  . ezetimibe (ZETIA) tablet 10 mg  10 mg Oral Daily Sueanne Margarita, MD   10 mg at 10/13/15 0936  . furosemide (LASIX) tablet 20 mg  20 mg Oral Daily [name], MD   20 mg at 10/13/15 0936  . guaiFENesin-dextromethorphan (ROBITUSSIN DM) 100-10 MG/5ML syrup 15 mL  15 mL Oral Q4H PRN Samantha J Rhyne, PA-C      . labetalol (NORMODYNE,TRANDATE) injection 5 mg  5 mg Intravenous Q10 min PRN [name], MD   5 mg at 10/11/15 1322  . lactated ringers infusion   Intravenous Continuous Nolon Nations, MD 10 mL/hr at 10/10/15 1203    . magnesium hydroxide (MILK OF MAGNESIA) suspension 30 mL  30 mL Oral Daily PRN Samantha J Rhyne, PA-C      . metoprolol (LOPRESSOR) injection 2-5 mg  2-5 mg Intravenous Q2H PRN Samantha J Rhyne, PA-C      . morphine 2 MG/ML injection 2-3 mg  2-3 mg Intravenous Q2H PRN Samantha J Rhyne, PA-C      . ondansetron (ZOFRAN) injection 4 mg  4 mg Intravenous Q6H PRN Hulen Shouts Rhyne, PA-C   4 mg at 10/12/15 0756  . oxyCODONE (Oxy  IR/ROXICODONE) immediate release tablet 5-10 mg  5-10 mg Oral Q6H PRN Samantha J Rhyne, PA-C      . pantoprazole (PROTONIX) EC tablet 40 mg  40 mg Oral Daily Samantha J Rhyne, PA-C   40 mg at 10/13/15 0936  . phenol (CHLORASEPTIC) mouth spray 1 spray  1 spray Mouth/Throat PRN Samantha J Rhyne, PA-C      . senna-docusate (Senokot-S) tablet 1 tablet  1 tablet Oral  QHS PRN [name], MD      . sodium chloride flush (NS) 0.9 % injection 3 mL  3 mL Intravenous Q12H [name], MD   3 mL at 10/13/15 0936  . sodium chloride flush (NS) 0.9 % injection 3 mL  3 mL Intravenous PRN [name], MD   3 mL at 10/12/15 2155  . tamsulosin (FLOMAX) capsule 0.4 mg  0.4 mg Oral QPC supper Nishant Dhungel, MD   0.4 mg at 10/12/15 1602  . THROMBI-PAD (THROMBI-PAD) 3"X3" pad 1 each  1 each Topical Once Nishant Dhungel, MD         Discharge Medications: Please see discharge summary for a list of discharge medications.  Relevant Imaging Results:  Relevant Lab Results:   Additional Information SS#: [id number]  Raymondo Band, LCSW

## 2015-10-13 NOTE — Progress Notes (Signed)
[facility] PROGRESS NOTE  [name] [id number] DOB: 11/04/1940 DOA: 10/06/2015 PCP: Cathlean Cower, MD  Brief narrative On 75 year old male with history of hypertension, prior MI, prior CVA and A. fib not on anticoagulation due to history of anemia admitted with one-day history of left leg weakness and numbness. On presentation patient was hypertensive to 220/100 mmHg. Head CT was unremarkable for acute abnormality. MRI of the brain showed acute ischemic infarct of the right frontal, parietal and occipital lobes. Admitted to hospitalist service for stroke workup and management. Neurology following. On 3/10 patient became confused and had a near-syncopal event while in the bathroom. He was found to have slurred speech. Code stroke was called. Symptoms had resolved upon my evaluation. Head CT done was unremarkable. MRI of the brain showed evolving small acute infarcts without right cerebral hemisphere with likely watershed ischemia.  Assessment/Plan: Acute ischemic stroke MRI brain shows patchy right frontal, parietal and occipital infarcts. CT angiogram of the head and neck showed high-grade right ICA stenosis with bulky calcified plaque. 2-D echo was unremarkable without any source of embolus. A cerebral angiogram was done showed 90% right supraclinoid stenosis, 50% cavernous stenosis and 70% stenosis at the left vertebrobasilar junction. LDL of 206 and A1c of 6.2.  Continue Lipitor and Zetia. Started on Eliquis given his A. fib along with baby aspirin. Allowing permissive hypertension.  Vascular surgery consulted and patient underwent carotid endarterectomy on 3/9. Tolerated well. -PT recommends CIR who has been consulted.  Near syncope on 3/10  Have slurred speech with near syncope which subsided within a few minutes. Head CT was unremarkable. MRI brain showing small acute infarcts involving throughout right cerebral hemisphere which prominent areas of new infarction, likely watershed  ischemia. Discussed with Dr. Erlinda Hong and recommends that can be seen following CEA are due to hypotension. . No new neurological deficit on exam. Speech is clear. Suspect of aggressive vagal versus orthostatic event. -Remains stable.  Coronary artery disease Continue aspirin, statin  paroxysmal A. fib CHADSVasc score of 5 ( HTN, age, stroke, PVD). Not on anticoagulation previously due to history of ?anemia.  Started on Eliquis.    Essential hypertension Blood pressure better after resumed his beta blocker and lisinopril. Allowing permissible hypertension.  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 He reports seeing [facility]. Foley placed in on 3/10 for urinary retention. Started on Flomax. Patient will be discharged on Foley and follow-up with Dr. Jeffie Pollock as outpatient  Diet: Heart healthy  DVT prophylaxis: eliquis  Code Status: Full code Family Communication: None at bedside Disposition Plan:  Possibly discharge to CIR tomorrow if approved   Consultants:  Neurology  [facility] surgery  Cardiology  IR  Procedures:  Abdomen  CT angiogram of the head and neck  Head CT  Bilateral common carotid and vertebral arteriograms  2-D echo  Coronary CT  Antibiotics:  None  HPI/Subjective: Seen and examined. No Overnight issues.  Objective: Filed Vitals:   10/13/15 0400 10/13/15 0800  BP: 162/86 168/79  Pulse: 75 74  Temp: 98.8 F (37.1 C)   Resp: 19 19    Intake/Output Summary (Last 24 hours) at 10/13/15 1058 Last data filed at 10/13/15 0900  Gross per 24 hour  Intake   3146 ml  Output   2575 ml  Net    571 ml   Filed Weights   10/07/15 0028 10/10/15 1819  Weight: 76.386 kg (168 lb 6.4 oz) 78.291 kg (172 lb 9.6 oz)  Exam:   General:  not in distress  HEENT:  moist mucosa, Swelling over his right neck following CEA.  Chest: Clear bilaterally  CVS: Normal S1 and S2, no murmurs  GI: Soft, nondistended,  nontender,  Musculoskeletal: Warm, no edema,  CNS: Alert and oriented, no further facial droop, good strength in bilateral extremities   Data Reviewed: Basic Metabolic Panel:  Recent Labs Lab 10/06/15 1542 10/06/15 1547 10/10/15 0552 10/11/15 0400 10/12/15 0402  NA 138 142 139 136 141  K 3.5 3.6 3.7 4.0 3.8  CL 102 102 107 106 106  CO2 26  --  22 23 27   GLUCOSE 76 78 95 113* 101*  BUN 14 18 19 15 14   CREATININE 1.10 1.00 1.11 1.08 0.97  CALCIUM 9.1  --  8.7* 8.1* 8.5*   Liver Function Tests:  Recent Labs Lab 10/06/15 1542 10/12/15 0402  AST 22 23  ALT 12* 13*  ALKPHOS 57 42  BILITOT 0.5 0.4  PROT 7.6 6.9  ALBUMIN 3.5 3.0*   No results for input(s): LIPASE, AMYLASE in the last 168 hours.  Recent Labs Lab 10/12/15 0402  AMMONIA 27   CBC:  Recent Labs Lab 10/06/15 1542 10/06/15 1547 10/08/15 0007 10/10/15 0552 10/11/15 0400 10/12/15 0402  WBC 4.5  --  5.6 5.8 8.7 7.7  NEUTROABS 2.9  --   --   --   --   --   HGB 12.6* 15.6 12.2* 11.4* 10.1* 9.6*  HCT 39.8 46.0 38.4* 35.9* 32.0* 31.7*  MCV 68.4*  --  68.4* 68.0* 67.8* 68.6*  PLT 181  --  178 165 164 171   Cardiac Enzymes: No results for input(s): CKTOTAL, CKMB, CKMBINDEX, TROPONINI in the last 168 hours. BNP (last 3 results)  Recent Labs  06/19/15 1039  BNP 301.8*    ProBNP (last 3 results) No results for input(s): PROBNP in the last 8760 hours.  CBG:  Recent Labs Lab 10/06/15 1646 10/11/15 1536  GLUCAP 61* 138*    Recent Results (from the past 240 hour(s))  Surgical pcr screen     Status: None   Collection Time: 10/10/15  6:32 AM  Result Value Ref Range Status   MRSA, PCR NEGATIVE NEGATIVE Final   Staphylococcus aureus NEGATIVE NEGATIVE Final    Comment:        The Xpert SA Assay (FDA approved for NASAL specimens in patients over 59 years of age), is one component of a comprehensive surveillance program.  Test performance has been validated by [facility] for patients  greater than or equal to 53 year old. It is not intended to diagnose infection nor to guide or monitor treatment.      Studies: Ct Head Wo Contrast  10/11/2015  CLINICAL DATA:  Code stroke.  Slurred speech. EXAM: CT HEAD WITHOUT CONTRAST TECHNIQUE: Contiguous axial images were obtained from the base of the skull through the vertex without intravenous contrast. COMPARISON:  10/06/2015 FINDINGS: Mild generalized age related volume loss. Mild chronic small-vessel ischemic change of the deep white matter. No CT evidence of acute infarction, mass lesion, hemorrhage, hydrocephalus or extra-axial collection. Question slight hyperdensity in the right MCA, not definite. Calvarium is unremarkable. Sinuses are clear. There is atherosclerotic calcification of the major vessels at the base of the brain. IMPRESSION: No acute finding by CT. These results were called by telephone at the time of interpretation on 10/11/2015 at 4:00 pm to Dr. Wallie Char , who verbally acknowledged these results. Electronically Signed   By:  Nelson Chimes M.D.   On: 10/11/2015 16:00   Mr Brain Wo Contrast  10/11/2015  CLINICAL DATA:  Acute encephalopathy. Episode of altered mental status in the bathroom earlier today. EXAM: MRI HEAD WITHOUT CONTRAST TECHNIQUE: Multiplanar, multiecho pulse sequences of the brain and surrounding structures were obtained without intravenous contrast. COMPARISON:  Head CT 10/11/2015 and MRI 10/06/2015 FINDINGS: Multiple small foci of restricted diffusion are again seen in the right cerebral hemisphere involving cortex and white matter. Some of these have decreased in conspicuity compared to the prior MRI, however there are scattered areas of acute infarction which are new or slightly larger than on the prior study. These have been marked with arrows on axial diffusion series 4 and are present in the right parietal cortex, right centrum semiovale and corona radiata, anterior right frontal lobe, right  occipital cortex and white matter, and right temporal lobe. There is no evidence of intracranial hemorrhage, mass, midline shift, or extra-axial fluid collection. There is mild cerebral atrophy. Patchy T2 hyperintensities in the cerebral white matter bilaterally are compatible with moderate chronic small vessel ischemic disease. Chronic lacunar infarcts are noted in the thalami and deep cerebral white matter bilaterally. Orbits are unremarkable. Mild paranasal sinus mucosal thickening is noted. Mastoid air cells are clear. Major intracranial vascular flow voids are preserved, with evidence of distal right ICA stenosis better evaluated on recent CTA. IMPRESSION: 1. Evolving, small acute infarcts throughout the right cerebral hemisphere with some areas of new infarction, likely watershed ischemia. 2. Moderate chronic small vessel ischemic disease. Electronically Signed   By: Logan Bores M.D.   On: 10/11/2015 20:49    Scheduled Meds: . amLODipine  10 mg Oral Daily  . apixaban  5 mg Oral BID  . aspirin  81 mg Oral Daily  . atorvastatin  80 mg Oral Daily  . carvedilol  3.125 mg Oral BID WC  . docusate sodium  100 mg Oral Daily  . ezetimibe  10 mg Oral Daily  . furosemide  20 mg Oral Daily  . pantoprazole  40 mg Oral Daily  . sodium chloride flush  3 mL Intravenous Q12H  . tamsulosin  0.4 mg Oral QPC supper  . THROMBI-PAD  1 each Topical Once   Continuous Infusions: . sodium chloride Stopped (10/11/15 0700)  . sodium chloride 100 mL/hr at 10/13/15 0900  . lactated ringers 10 mL/hr at 10/10/15 1203      Time spent: [place], [facility]  [facility] Pager [phone]. If 7PM-7AM, please contact night-coverage at [email or web address], password [facility] 10/13/2015, 10:58 AM  LOS: 7 days

## 2015-10-14 ENCOUNTER — Encounter (HOSPITAL_COMMUNITY): Payer: Self-pay | Admitting: *Deleted

## 2015-10-14 ENCOUNTER — Inpatient Hospital Stay (HOSPITAL_COMMUNITY)
Admission: RE | Admit: 2015-10-14 | Discharge: 2015-10-21 | DRG: 057 | Disposition: A | Discharge status: Home Health | Payer: Medicare Other | Source: Intra-hospital | Attending: Physical Medicine & Rehabilitation | Admitting: Physical Medicine & Rehabilitation

## 2015-10-14 ENCOUNTER — Telehealth: Payer: Self-pay | Admitting: Surgery

## 2015-10-14 ENCOUNTER — Ambulatory Visit: Payer: Self-pay | Admitting: Internal Medicine

## 2015-10-14 DIAGNOSIS — I480 Paroxysmal atrial fibrillation: Secondary | ICD-10-CM | POA: Diagnosis not present

## 2015-10-14 DIAGNOSIS — Z8673 Personal history of transient ischemic attack (TIA), and cerebral infarction without residual deficits: Secondary | ICD-10-CM

## 2015-10-14 DIAGNOSIS — I69393 Ataxia following cerebral infarction: Secondary | ICD-10-CM

## 2015-10-14 DIAGNOSIS — R339 Retention of urine, unspecified: Secondary | ICD-10-CM | POA: Insufficient documentation

## 2015-10-14 DIAGNOSIS — Z79899 Other long term (current) drug therapy: Secondary | ICD-10-CM

## 2015-10-14 DIAGNOSIS — I6521 Occlusion and stenosis of right carotid artery: Secondary | ICD-10-CM | POA: Diagnosis not present

## 2015-10-14 DIAGNOSIS — I2510 Atherosclerotic heart disease of native coronary artery without angina pectoris: Secondary | ICD-10-CM | POA: Insufficient documentation

## 2015-10-14 DIAGNOSIS — I639 Cerebral infarction, unspecified: Secondary | ICD-10-CM | POA: Diagnosis not present

## 2015-10-14 DIAGNOSIS — I252 Old myocardial infarction: Secondary | ICD-10-CM | POA: Diagnosis not present

## 2015-10-14 DIAGNOSIS — N320 Bladder-neck obstruction: Secondary | ICD-10-CM

## 2015-10-14 DIAGNOSIS — N401 Enlarged prostate with lower urinary tract symptoms: Secondary | ICD-10-CM

## 2015-10-14 DIAGNOSIS — I69354 Hemiplegia and hemiparesis following cerebral infarction affecting left non-dominant side: Secondary | ICD-10-CM | POA: Diagnosis not present

## 2015-10-14 DIAGNOSIS — E876 Hypokalemia: Secondary | ICD-10-CM | POA: Insufficient documentation

## 2015-10-14 DIAGNOSIS — Z9889 Other specified postprocedural states: Secondary | ICD-10-CM | POA: Insufficient documentation

## 2015-10-14 DIAGNOSIS — I63331 Cerebral infarction due to thrombosis of right posterior cerebral artery: Secondary | ICD-10-CM | POA: Insufficient documentation

## 2015-10-14 DIAGNOSIS — D62 Acute posthemorrhagic anemia: Secondary | ICD-10-CM

## 2015-10-14 DIAGNOSIS — E785 Hyperlipidemia, unspecified: Secondary | ICD-10-CM | POA: Diagnosis not present

## 2015-10-14 DIAGNOSIS — D509 Iron deficiency anemia, unspecified: Secondary | ICD-10-CM

## 2015-10-14 DIAGNOSIS — R338 Other retention of urine: Secondary | ICD-10-CM | POA: Diagnosis not present

## 2015-10-14 DIAGNOSIS — Z7982 Long term (current) use of aspirin: Secondary | ICD-10-CM | POA: Diagnosis not present

## 2015-10-14 DIAGNOSIS — I6529 Occlusion and stenosis of unspecified carotid artery: Secondary | ICD-10-CM | POA: Insufficient documentation

## 2015-10-14 DIAGNOSIS — R030 Elevated blood-pressure reading, without diagnosis of hypertension: Secondary | ICD-10-CM | POA: Diagnosis not present

## 2015-10-14 DIAGNOSIS — R0989 Other specified symptoms and signs involving the circulatory and respiratory systems: Secondary | ICD-10-CM | POA: Insufficient documentation

## 2015-10-14 DIAGNOSIS — I10 Essential (primary) hypertension: Secondary | ICD-10-CM | POA: Diagnosis not present

## 2015-10-14 DIAGNOSIS — N400 Enlarged prostate without lower urinary tract symptoms: Secondary | ICD-10-CM

## 2015-10-14 DIAGNOSIS — K5900 Constipation, unspecified: Secondary | ICD-10-CM

## 2015-10-14 DIAGNOSIS — R7303 Prediabetes: Secondary | ICD-10-CM | POA: Diagnosis not present

## 2015-10-14 DIAGNOSIS — R269 Unspecified abnormalities of gait and mobility: Secondary | ICD-10-CM | POA: Diagnosis not present

## 2015-10-14 LAB — MAGNESIUM: Magnesium: 1.7 mg/dL (ref 1.7–2.4)

## 2015-10-14 LAB — POTASSIUM: POTASSIUM: 3.5 mmol/L (ref 3.5–5.1)

## 2015-10-14 MED ORDER — APIXABAN 5 MG PO TABS: 5.0000 mg | ORAL_TABLET | Freq: Two times a day (BID) | ORAL | Status: DC

## 2015-10-14 MED ORDER — DOCUSATE SODIUM 100 MG PO CAPS
100.0000 mg | ORAL_CAPSULE | Freq: Every day | ORAL | Status: DC
  Administered 2015-10-15 – 2015-10-21 (×7): 100 mg via ORAL
  Filled 2015-10-14 (×7): qty 1

## 2015-10-14 MED ORDER — PANTOPRAZOLE SODIUM 40 MG PO TBEC
40.0000 mg | DELAYED_RELEASE_TABLET | Freq: Every day | ORAL | Status: DC
  Administered 2015-10-15 – 2015-10-21 (×7): 40 mg via ORAL
  Filled 2015-10-14 (×7): qty 1

## 2015-10-14 MED ORDER — SORBITOL 70 % SOLN: 30.0000 mL | Freq: Every day | Status: DC | PRN

## 2015-10-14 MED ORDER — TAMSULOSIN HCL 0.4 MG PO CAPS: 0.4000 mg | ORAL_CAPSULE | Freq: Every day | ORAL | Status: DC

## 2015-10-14 MED ORDER — ACETAMINOPHEN 650 MG RE SUPP: 325.0000 mg | RECTAL | Status: DC | PRN

## 2015-10-14 MED ORDER — EZETIMIBE 10 MG PO TABS: 10.0000 mg | ORAL_TABLET | Freq: Every day | ORAL | Status: DC

## 2015-10-14 MED ORDER — ONDANSETRON HCL 4 MG PO TABS: 4.0000 mg | ORAL_TABLET | Freq: Four times a day (QID) | ORAL | Status: DC | PRN

## 2015-10-14 MED ORDER — BISACODYL 10 MG RE SUPP: 10.0000 mg | Freq: Every day | RECTAL | Status: DC | PRN

## 2015-10-14 MED ORDER — FUROSEMIDE 20 MG PO TABS
20.0000 mg | ORAL_TABLET | Freq: Every day | ORAL | Status: DC
  Administered 2015-10-15 – 2015-10-21 (×7): 20 mg via ORAL
  Filled 2015-10-14 (×7): qty 1

## 2015-10-14 MED ORDER — CARVEDILOL 12.5 MG PO TABS: 12.5000 mg | ORAL_TABLET | Freq: Two times a day (BID) | ORAL | Status: DC

## 2015-10-14 MED ORDER — ACETAMINOPHEN 325 MG PO TABS: 325.0000 mg | ORAL_TABLET | ORAL | Status: DC | PRN

## 2015-10-14 MED ORDER — ATORVASTATIN CALCIUM 80 MG PO TABS
80.0000 mg | ORAL_TABLET | Freq: Every day | ORAL | Status: DC
  Administered 2015-10-15 – 2015-10-21 (×7): 80 mg via ORAL
  Filled 2015-10-14 (×7): qty 1

## 2015-10-14 MED ORDER — ASPIRIN 81 MG PO CHEW
81.0000 mg | CHEWABLE_TABLET | Freq: Every day | ORAL | Status: DC
Start: 2015-10-15 — End: 2015-10-21
  Administered 2015-10-15 – 2015-10-21 (×7): 81 mg via ORAL
  Filled 2015-10-14 (×7): qty 1

## 2015-10-14 MED ORDER — PANTOPRAZOLE SODIUM 40 MG PO TBEC: 40.0000 mg | DELAYED_RELEASE_TABLET | Freq: Every day | ORAL | Status: DC

## 2015-10-14 MED ORDER — AMLODIPINE BESYLATE 10 MG PO TABS
10.0000 mg | ORAL_TABLET | Freq: Every day | ORAL | Status: DC
  Administered 2015-10-15 – 2015-10-21 (×7): 10 mg via ORAL
  Filled 2015-10-14 (×7): qty 1

## 2015-10-14 MED ORDER — ONDANSETRON HCL 4 MG/2ML IJ SOLN: 4.0000 mg | Freq: Four times a day (QID) | INTRAMUSCULAR | Status: DC | PRN

## 2015-10-14 MED ORDER — OXYCODONE HCL 5 MG PO TABS: 5.0000 mg | ORAL_TABLET | Freq: Four times a day (QID) | ORAL | Status: DC | PRN

## 2015-10-14 MED ORDER — APIXABAN 5 MG PO TABS
5.0000 mg | ORAL_TABLET | Freq: Two times a day (BID) | ORAL | Status: DC
  Administered 2015-10-14 – 2015-10-21 (×14): 5 mg via ORAL
  Filled 2015-10-14 (×14): qty 1

## 2015-10-14 MED ORDER — TAMSULOSIN HCL 0.4 MG PO CAPS
0.4000 mg | ORAL_CAPSULE | Freq: Every day | ORAL | Status: DC
  Administered 2015-10-14 – 2015-10-17 (×4): 0.4 mg via ORAL
  Filled 2015-10-14 (×4): qty 1

## 2015-10-14 MED ORDER — SENNOSIDES-DOCUSATE SODIUM 8.6-50 MG PO TABS: 1.0000 | ORAL_TABLET | Freq: Every evening | ORAL | Status: DC | PRN

## 2015-10-14 MED ORDER — EZETIMIBE 10 MG PO TABS
10.0000 mg | ORAL_TABLET | Freq: Every day | ORAL | Status: DC
  Administered 2015-10-15 – 2015-10-21 (×7): 10 mg via ORAL
  Filled 2015-10-14 (×7): qty 1

## 2015-10-14 MED ORDER — ALBUTEROL SULFATE (2.5 MG/3ML) 0.083% IN NEBU: 2.5000 mg | INHALATION_SOLUTION | Freq: Four times a day (QID) | RESPIRATORY_TRACT | Status: DC | PRN

## 2015-10-14 NOTE — Discharge Summary (Signed)
Physician Discharge Summary  LUNA COLELLA [id number] DOB: 1941/07/03 DOA: 10/06/2015  PCP: Cathlean Cower, MD  Admit date: 10/06/2015 Discharge date: 10/14/2015  Time spent: 35 minutes  Recommendations for Outpatient Follow-up:  1. Discharged to inpatient rehabilitation. 2. Patient will be discharged on Foley catheter for urinary retention. He needs to see his urologist Dr. Jeffie Pollock in 3-.   Discharge Diagnoses:  Principal Problem:   Acute ischemic stroke ([place])  Active Problems:   BPH (benign prostatic hyperplasia)   CAD (coronary artery disease)   CKD (chronic kidney disease), stage III   Dyslipidemia   PAF (paroxysmal atrial fibrillation) (HCC)   Hypertensive urgency   Prediabetes   Right cavernous carotid stenosis   Carotid stenosis   Benign essential HTN   Tachycardia   Acute blood loss anemia   Near syncope   Discharge Condition: Fair  Diet recommendation: Heart healthy  CODE STATUS: Full code  [facility]   10/07/15 0028 10/10/15 1819  Weight: 76.386 kg (168 lb 6.4 oz) 78.291 kg (172 lb 9.6 oz)    History of present illness:  Please refer to admission H&P for details, in brief, On 75 year old male with history of hypertension, prior MI, prior CVA and A. fib not on anticoagulation due to history of anemia admitted with one-day history of left leg weakness and numbness. On presentation patient was hypertensive to 220/100 mmHg. Head CT was unremarkable for acute abnormality. MRI of the brain showed acute ischemic infarct of the right frontal, parietal and occipital lobes. Admitted to hospitalist service for stroke workup and management. Neurology following. On 3/10 patient became confused and had a near-syncopal event while in the bathroom. He was found to have slurred speech. Code stroke was called. Symptoms had resolved upon my evaluation. Head CT done was unremarkable. MRI of the brain showed evolving small acute infarcts without right cerebral hemisphere with  likely watershed ischemia.  Hospital Course:  Acute ischemic stroke MRI brain shows patchy right frontal, parietal and occipital infarcts. CT angiogram of the head and neck showed high-grade right ICA stenosis with bulky calcified plaque. 2-D echo was unremarkable without any source of embolus. A cerebral angiogram was done showed 90% right supraclinoid stenosis, 50% cavernous stenosis and 70% stenosis at the left vertebrobasilar junction. LDL of 206 and A1c of 6.2. Continue Lipitor and Zetia. Started on Eliquis given his A. fib along with baby aspirin. Vascular surgery consulted and patient underwent carotid endarterectomy on 3/9. Tolerated well. -PT recommends CIR and will be discharged there. Patient has significant improvement in his left-sided weakness.  Near syncope on 3/10 Have slurred speech with near syncope which subsided within a few minutes. Head CT was unremarkable. MRI brain showing small acute infarcts involving throughout right cerebral hemisphere which prominent areas of new infarction, likely watershed ischemia. Discussed with Dr. Erlinda Hong and recommends that can be seen following CEA are due to hypotension. . No new neurological deficit on exam. Speech is clear. Suspect of aggressive vagal versus orthostatic event. -No further episodes and patient has remained stable.  Coronary artery disease Continue aspirin, statin  paroxysmal A. fib CHADSVasc score of 5 ( HTN, age, stroke, PVD). Not on anticoagulation previously due to history of ?anemia. Started on Eliquis.    Uncontrolled hypertension Allowed permissible hypertension. Patient on multiple antihypertensives (it seems he was not compliant). I have resumed his Coreg and low-dose lisinopril along with Imdur. Blood pressure better controlled.  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 He reports seeing [facility]. Foley placed in on 3/10 for urinary retention. Started on Flomax.  Patient will be discharged on Foley and follow-up with Dr. Jeffie Pollock as outpatient.    Code Status: Full code Family Communication: None at bedside Disposition Plan: Possibly discharge to CIR tomorrow if approved   Consultants:  Neurology  [facility] surgery  Cardiology  IR  Procedures:  Abdomen  CT angiogram of the head and neck  Head CT  Bilateral common carotid and vertebral arteriograms  2-D echo  Coronary CT  Antibiotics:  None    Discharge Exam: Filed Vitals:   10/14/15 1349 10/14/15 1351  BP: 157/71   Pulse: 78   Temp:  97.4 F (36.3 C)  Resp: 21      General: not in distress  HEENT: moist mucosa, Swelling over his right neck following CEA.  Chest: Clear bilaterally  CVS: Normal S1 and S2, no murmurs  GI: Soft, nondistended, nontender,  Musculoskeletal: Warm, no edema,  CNS: Alert and oriented, no further facial droop, good strength in bilateral extremities Discharge Instructions   Discharge Instructions    Ambulatory referral to Neurology    Complete by:  As directed   Stroke office f/u in 2 months          Current Discharge Medication List    START taking these medications   Details  apixaban (ELIQUIS) 5 MG TABS tablet Take 1 tablet (5 mg total) by mouth 2 (two) times daily. Qty: 60 tablet, Refills: 0    ezetimibe (ZETIA) 10 MG tablet Take 1 tablet (10 mg total) by mouth daily. Qty: 30 tablet, Refills: 0    pantoprazole (PROTONIX) 40 MG tablet Take 1 tablet (40 mg total) by mouth daily. Qty: 30 tablet, Refills: 0    senna-docusate (SENOKOT-S) 8.6-50 MG tablet Take 1 tablet by mouth at bedtime as needed for mild constipation. Qty: 15 tablet, Refills: 0    tamsulosin (FLOMAX) 0.4 MG CAPS capsule Take 1 capsule (0.4 mg total) by mouth daily after supper. Qty: 30 capsule, Refills: 0      CONTINUE these medications which have NOT CHANGED   Details  albuterol (PROVENTIL HFA;VENTOLIN HFA) 108 (90 BASE) MCG/ACT inhaler Inhale 2  puffs into the lungs every 6 (six) hours as needed for wheezing or shortness of breath. Qty: 1 Inhaler, Refills: 2    amLODipine (NORVASC) 10 MG tablet Take 1 tablet (10 mg total) by mouth daily. Qty: 30 tablet, Refills: 0    aspirin EC 81 MG tablet Take 1 tablet (81 mg total) by mouth daily. Qty: 30 tablet, Refills: 0    atorvastatin (LIPITOR) 80 MG tablet Take 1 tablet (80 mg total) by mouth daily. Qty: 90 tablet, Refills: 3    carvedilol (COREG) 3.125 MG tablet Take 1 tablet (3.125 mg total) by mouth 2 (two) times daily with a meal. Qty: 60 tablet, Refills: 0    isosorbide mononitrate (IMDUR) 30 MG 24 hr tablet Take 1 tablet (30 mg total) by mouth daily. Qty: 30 tablet, Refills: 0    lisinopril (PRINIVIL,ZESTRIL) 5 MG tablet Take 1 tablet (5 mg total) by mouth daily. Qty: 30 tablet, Refills: 0      STOP taking these medications     amitriptyline (ELAVIL) 50 MG tablet      furosemide (LASIX) 20 MG tablet      tadalafil (CIALIS) 20 MG tablet        No Known Allergies Follow-up Information    Follow up with Annamarie Major, MD In 2  weeks.   Specialties:  Vascular Surgery, Cardiology   Why:  Office will call you to arrange your appt (sent)   Contact information:   [address] [id number] [phone]       Follow up with SETHI,PRAMOD, MD. Schedule an appointment as soon as possible for a visit in 2 months.   Specialties:  Neurology, Radiology   Why:  stroke clinic   Contact information:   [address] [place] [address] [phone]       Please follow up.   Why:  MD at inpt rehab       The results of significant diagnostics from this hospitalization (including imaging, microbiology, ancillary and laboratory) are listed below for reference.    Significant Diagnostic Studies: Ct Angio Head W/cm &/or Wo Cm  10/07/2015  CLINICAL DATA:  75 year old male with acute right hemisphere infarcts, mostly right MCA territory, following  presentation for left lower extremity weakness. Initial encounter. EXAM: CT ANGIOGRAPHY HEAD AND NECK TECHNIQUE: Multidetector CT imaging of the head and neck was performed using the standard protocol during bolus administration of intravenous contrast. Multiplanar CT image reconstructions and MIPs were obtained to evaluate the vascular anatomy. Carotid stenosis measurements (when applicable) are obtained utilizing NASCET criteria, using the distal internal carotid diameter as the denominator. CONTRAST:  26mL OMNIPAQUE IOHEXOL 350 MG/ML SOLN COMPARISON:  Brain MRI 10/06/2015 and earlier, including intracranial MRA a 11/20/2011. FINDINGS: CTA NECK Skeleton: Intermittent poor dentition. Widespread chronic cervical spine disc and endplate degeneration. No acute osseous abnormality identified. Mild paranasal sinus mucosal thickening. That in the left maxillary sinus might be associated with a small supernumerary tooth or tooth fragment (series 6, image 76). Tympanic cavities and mastoids appear clear. Other neck: Negative lung apices. No superior mediastinal lymphadenopathy. Thyroid, larynx, pharynx, parapharyngeal spaces, retropharyngeal space, sublingual space, submandibular glands, and parotid glands are within normal limits. Visualized orbits and scalp soft tissues are within normal limits. No cervical lymphadenopathy. Aortic arch: Bovine arch configuration. Mild to moderate arch atherosclerosis, with both soft and calcified plaque. Great vessel dolichoectasia and tortuosity. Right carotid system: No brachiocephalic artery or right CCA origin stenosis. Tortuous proximal right CCA. Soft and calcified plaque at the right carotid bifurcation with high-grade string sign stenosis, with low-density plaque affecting the proximal 10 mm of the vessel. See sagittal image 57 of series 10. Despite this the right ICA remains patent. The vessel is mildly tortuous with a partially retropharyngeal course. Left carotid system:  Bovine left CCA origin without stenosis. Mild calcified plaque at the left carotid bifurcation, widely patent left ICA origin and bulb. Tortuous but otherwise negative cervical left ICA, with a partially retropharyngeal course. Vertebral arteries:No proximal right subclavian artery stenosis despite calcified plaque. Calcified plaque near the right vertebral artery origin but without origin stenosis. However, there is soft plaque affecting the right V1 segment with moderate stenosis best seen on series 9, image 144. Occasionally tortuous right vertebral the 2 segment, and occasional calcified plaque but no other cervical right vertebral artery stenosis. No proximal left subclavian artery stenosis. Mildly dominant left vertebral artery with no origin stenosis despite soft plaque. V3 segment calcified plaque but no cervical left vertebral artery stenosis. CTA HEAD Posterior circulation: Bilateral calcified plaque affecting the vertebral arteries at the skullbase, however, a moderate to severe bilateral distal vertebral artery stenosis occurs due to soft plaque. Subsequent high-grade stenosis in both distal vertebral arteries and at the vertebrobasilar junction. Moderately to severely irregular basilar artery with additional  tandem stenoses. However, the basilar remains patent. Severe irregularity and stenosis at both PCA origins and also affecting the SCA origins to a lesser extent. Posterior communicating arteries might be present but are diminutive and irregular. Bilateral PCA branches are severely irregular and stenotic, worse on the right. Anterior circulation: Proximal ICA siphons are patent without plaque. However, there is moderate to severe calcified plaque from the cavernous segments to the supraclinoid ICAs. On the right this results in high-grade supraclinoid ICA stenosis best seen on series 8, image 111. There is moderate stenosis on the left. There is additional moderate to severe distal right ICA/terminus  stenosis related to calcified plaque best seen on series 8, image 110. Despite these findings both carotid termini are patent. There is irregularity at both MCA and ACA origins which remain patent. Severe stenosis of the right A1. The anterior communicating artery is present. Moderate to severe bilateral A2 segment irregularity and multifocal stenoses, best seen on series 13, image 18. Left MCA origin is patent with mild irregularity. Mild left M1 irregularity, with moderate Left MCA bifurcation is patent multifocal proximal M2 branch irregularity and stenosis best seen on series 11, image 22. On the right the MCA origin is mildly stenotic. The M1 segment is mildly to moderately irregular. The right MCA bifurcation is. There is moderate multifocal irregularity and stenosis in the dominant anterior M2 branch with attenuated anterior division MCA enhancement. Mild posterior division vessel irregularity. Venous sinuses: Patent. Anatomic variants: Bovine arch configuration. Delayed phase: No abnormal enhancement identified. Recently seen scattered small right MCA infarcts remain largely occult on CT. No acute intracranial hemorrhage identified. No intracranial mass effect. IMPRESSION: 1. Radiographic String Sign Stenosis at the right ICA origin related to Low-density plaque or thrombus, however, there is superimposed severe intracranial atherosclerosis including high-grade distal right ICA stenosis in part due to bulky calcified plaque. 2. Widespread intracranial moderate to severe first and second order vessel irregularity and stenosis, including severe stenoses at the: -bilateral distal vertebral arteries, - proximal basilar artery, - bilateral PCAs, - right ACA A1, - bilateral ACA distal A2 segments. 3. No intracranial hemorrhage or mass effect. Salient findings discussed by telephone with Neurology NP Burnetta Sabin on 10/07/2015 at 1142 hours. Electronically Signed   By: Genevie Ann M.D.   On: 10/07/2015 11:45   Ct Head  Wo Contrast  10/11/2015  CLINICAL DATA:  Code stroke.  Slurred speech. EXAM: CT HEAD WITHOUT CONTRAST TECHNIQUE: Contiguous axial images were obtained from the base of the skull through the vertex without intravenous contrast. COMPARISON:  10/06/2015 FINDINGS: Mild generalized age related volume loss. Mild chronic small-vessel ischemic change of the deep white matter. No CT evidence of acute infarction, mass lesion, hemorrhage, hydrocephalus or extra-axial collection. Question slight hyperdensity in the right MCA, not definite. Calvarium is unremarkable. Sinuses are clear. There is atherosclerotic calcification of the major vessels at the base of the brain. IMPRESSION: No acute finding by CT. These results were called by telephone at the time of interpretation on 10/11/2015 at 4:00 pm to Dr. Wallie Char , who verbally acknowledged these results. Electronically Signed   By: Nelson Chimes M.D.   On: 10/11/2015 16:00   Ct Head Wo Contrast  10/06/2015  CLINICAL DATA:  Left leg weakness. History of left MCA territory infarct. EXAM: CT HEAD WITHOUT CONTRAST TECHNIQUE: Contiguous axial images were obtained from the base of the skull through the vertex without intravenous contrast. COMPARISON:  Brain MR 11/20/2011.  CT 11/19/2011. FINDINGS: Sinuses/Soft tissues: Mucosal  thickening of left maxillary sinus ain ethmoid air cells. Clear mastoid air cells. Intracranial: Mild low density in the periventricular white matter likely related to small vessel disease. Cerebral and cerebellar atrophy. Vertebral and carotid atherosclerosis bilaterally. No mass lesion, hemorrhage, hydrocephalus, acute infarct, intra-axial, or extra-axial fluid collection. IMPRESSION: 1.  No acute intracranial abnormality. 2.  Cerebral atrophy and small vessel ischemic change. 3. Sinus disease. Electronically Signed   By: Abigail Miyamoto M.D.   On: 10/06/2015 17:15   Ct Angio Neck W/cm &/or Wo/cm  10/07/2015  CLINICAL DATA:  75 year old male with acute  right hemisphere infarcts, mostly right MCA territory, following presentation for left lower extremity weakness. Initial encounter. EXAM: CT ANGIOGRAPHY HEAD AND NECK TECHNIQUE: Multidetector CT imaging of the head and neck was performed using the standard protocol during bolus administration of intravenous contrast. Multiplanar CT image reconstructions and MIPs were obtained to evaluate the vascular anatomy. Carotid stenosis measurements (when applicable) are obtained utilizing NASCET criteria, using the distal internal carotid diameter as the denominator. CONTRAST:  26mL OMNIPAQUE IOHEXOL 350 MG/ML SOLN COMPARISON:  Brain MRI 10/06/2015 and earlier, including intracranial MRA a 11/20/2011. FINDINGS: CTA NECK Skeleton: Intermittent poor dentition. Widespread chronic cervical spine disc and endplate degeneration. No acute osseous abnormality identified. Mild paranasal sinus mucosal thickening. That in the left maxillary sinus might be associated with a small supernumerary tooth or tooth fragment (series 6, image 76). Tympanic cavities and mastoids appear clear. Other neck: Negative lung apices. No superior mediastinal lymphadenopathy. Thyroid, larynx, pharynx, parapharyngeal spaces, retropharyngeal space, sublingual space, submandibular glands, and parotid glands are within normal limits. Visualized orbits and scalp soft tissues are within normal limits. No cervical lymphadenopathy. Aortic arch: Bovine arch configuration. Mild to moderate arch atherosclerosis, with both soft and calcified plaque. Great vessel dolichoectasia and tortuosity. Right carotid system: No brachiocephalic artery or right CCA origin stenosis. Tortuous proximal right CCA. Soft and calcified plaque at the right carotid bifurcation with high-grade string sign stenosis, with low-density plaque affecting the proximal 10 mm of the vessel. See sagittal image 57 of series 10. Despite this the right ICA remains patent. The vessel is mildly tortuous  with a partially retropharyngeal course. Left carotid system: Bovine left CCA origin without stenosis. Mild calcified plaque at the left carotid bifurcation, widely patent left ICA origin and bulb. Tortuous but otherwise negative cervical left ICA, with a partially retropharyngeal course. Vertebral arteries:No proximal right subclavian artery stenosis despite calcified plaque. Calcified plaque near the right vertebral artery origin but without origin stenosis. However, there is soft plaque affecting the right V1 segment with moderate stenosis best seen on series 9, image 144. Occasionally tortuous right vertebral the 2 segment, and occasional calcified plaque but no other cervical right vertebral artery stenosis. No proximal left subclavian artery stenosis. Mildly dominant left vertebral artery with no origin stenosis despite soft plaque. V3 segment calcified plaque but no cervical left vertebral artery stenosis. CTA HEAD Posterior circulation: Bilateral calcified plaque affecting the vertebral arteries at the skullbase, however, a moderate to severe bilateral distal vertebral artery stenosis occurs due to soft plaque. Subsequent high-grade stenosis in both distal vertebral arteries and at the vertebrobasilar junction. Moderately to severely irregular basilar artery with additional tandem stenoses. However, the basilar remains patent. Severe irregularity and stenosis at both PCA origins and also affecting the SCA origins to a lesser extent. Posterior communicating arteries might be present but are diminutive and irregular. Bilateral PCA branches are severely irregular and stenotic, worse on the right. Anterior circulation:  Proximal ICA siphons are patent without plaque. However, there is moderate to severe calcified plaque from the cavernous segments to the supraclinoid ICAs. On the right this results in high-grade supraclinoid ICA stenosis best seen on series 8, image 111. There is moderate stenosis on the left.  There is additional moderate to severe distal right ICA/terminus stenosis related to calcified plaque best seen on series 8, image 110. Despite these findings both carotid termini are patent. There is irregularity at both MCA and ACA origins which remain patent. Severe stenosis of the right A1. The anterior communicating artery is present. Moderate to severe bilateral A2 segment irregularity and multifocal stenoses, best seen on series 13, image 18. Left MCA origin is patent with mild irregularity. Mild left M1 irregularity, with moderate Left MCA bifurcation is patent multifocal proximal M2 branch irregularity and stenosis best seen on series 11, image 22. On the right the MCA origin is mildly stenotic. The M1 segment is mildly to moderately irregular. The right MCA bifurcation is. There is moderate multifocal irregularity and stenosis in the dominant anterior M2 branch with attenuated anterior division MCA enhancement. Mild posterior division vessel irregularity. Venous sinuses: Patent. Anatomic variants: Bovine arch configuration. Delayed phase: No abnormal enhancement identified. Recently seen scattered small right MCA infarcts remain largely occult on CT. No acute intracranial hemorrhage identified. No intracranial mass effect. IMPRESSION: 1. Radiographic String Sign Stenosis at the right ICA origin related to Low-density plaque or thrombus, however, there is superimposed severe intracranial atherosclerosis including high-grade distal right ICA stenosis in part due to bulky calcified plaque. 2. Widespread intracranial moderate to severe first and second order vessel irregularity and stenosis, including severe stenoses at the: -bilateral distal vertebral arteries, - proximal basilar artery, - bilateral PCAs, - right ACA A1, - bilateral ACA distal A2 segments. 3. No intracranial hemorrhage or mass effect. Salient findings discussed by telephone with Neurology NP Burnetta Sabin on 10/07/2015 at 1142 hours.  Electronically Signed   By: Genevie Ann M.D.   On: 10/07/2015 11:45   Mr Brain Wo Contrast  10/11/2015  CLINICAL DATA:  Acute encephalopathy. Episode of altered mental status in the bathroom earlier today. EXAM: MRI HEAD WITHOUT CONTRAST TECHNIQUE: Multiplanar, multiecho pulse sequences of the brain and surrounding structures were obtained without intravenous contrast. COMPARISON:  Head CT 10/11/2015 and MRI 10/06/2015 FINDINGS: Multiple small foci of restricted diffusion are again seen in the right cerebral hemisphere involving cortex and white matter. Some of these have decreased in conspicuity compared to the prior MRI, however there are scattered areas of acute infarction which are new or slightly larger than on the prior study. These have been marked with arrows on axial diffusion series 4 and are present in the right parietal cortex, right centrum semiovale and corona radiata, anterior right frontal lobe, right occipital cortex and white matter, and right temporal lobe. There is no evidence of intracranial hemorrhage, mass, midline shift, or extra-axial fluid collection. There is mild cerebral atrophy. Patchy T2 hyperintensities in the cerebral white matter bilaterally are compatible with moderate chronic small vessel ischemic disease. Chronic lacunar infarcts are noted in the thalami and deep cerebral white matter bilaterally. Orbits are unremarkable. Mild paranasal sinus mucosal thickening is noted. Mastoid air cells are clear. Major intracranial vascular flow voids are preserved, with evidence of distal right ICA stenosis better evaluated on recent CTA. IMPRESSION: 1. Evolving, small acute infarcts throughout the right cerebral hemisphere with some areas of new infarction, likely watershed ischemia. 2. Moderate chronic small vessel ischemic  disease. Electronically Signed   By: Logan Bores M.D.   On: 10/11/2015 20:49   Mr Brain Wo Contrast  10/06/2015  CLINICAL DATA:  Initial evaluation for acute left leg  weakness. EXAM: MRI HEAD WITHOUT CONTRAST TECHNIQUE: Multiplanar, multiecho pulse sequences of the brain and surrounding structures were obtained without intravenous contrast. COMPARISON:  Prior CT from earlier the same day. FINDINGS: There are multi focal patchy areas of restricted diffusion involving the cortical gray matter and subcortical white matter of the right frontal and parietal lobes. Few scattered small foci of patchy ischemic infarcts present within the right occipital lobe as well. These primarily involve the cortical gray matter and deep white matter of the centrum semi ovale. No basal ganglia infarct. No associated hemorrhage or significant mass effect. No other infarct involving the left cerebral hemisphere or infratentorial brain. Circumferential FLAIR signal within the visualized distal cervical/petrous right ICA suggestive of possible slow flow and/or partial occlusion or atheromatous disease. Major intracranial vascular flow voids otherwise maintained. Diffuse prominence of the CSF containing spaces compatible with generalized cerebral atrophy. Patchy T2/FLAIR hyperintensity within the periventricular and deep white matter both cerebral hemispheres most consistent with chronic small vessel ischemic disease, moderate in nature. Small vessel type changes present within the pons. Few scattered small remote lacunar infarcts within the bilateral corona radiata. Small remote lacunar infarct within the left thalamus. No mass lesion, midline shift, or mass effect. No hydrocephalus. No extra-axial fluid collection. Major dural sinuses are grossly patent. Craniocervical junction within normal limits. Mild degenerative spondylolysis present within the upper cervical spine without significant stenosis. Pituitary gland normal.  No acute abnormality about the orbits. Scattered mucosal thickening within the paranasal sinuses. Small air-fluid level within the left maxillary sinuses. No mastoid effusion. Inner  ear structures within normal limits. Bone marrow signal intensity normal. No scalp soft tissue abnormality. IMPRESSION: 1. Patchy acute ischemic nonhemorrhagic infarcts involving the cortical gray matter and deep white matter of the right frontal, right parietal, and right occipital lobes. No associated mass effect. 2. Subtle abnormal flow void within the distal right ICA, which may reflect underlying atheromatous disease, slow flow, and/or partial occlusion. 3. Generalized cerebral atrophy with moderate chronic small vessel ischemic disease. 4. Mild paranasal sinus disease as above. Electronically Signed   By: Jeannine Boga M.D.   On: 10/06/2015 22:30   Ct Coronary Morp W/cta Cor W/score W/ca W/cm &/or Wo/cm  10/10/2015  ADDENDUM REPORT: 10/10/2015 10:55 CLINICAL DATA:  Chest pain EXAM: Cardiac/Coronary  CT TECHNIQUE: The patient was scanned on a Philips 256 scanner. FINDINGS: A 120 kV prospective scan was triggered in the descending thoracic aorta at 111 HU's. Axial non-contrast 3 mm slices were carried out through the heart. The data set was analyzed on a dedicated work station and scored using the Girard. Gantry rotation speed was 270 msecs and collimation was .9 mm. 5 mg of iv Metoprolol and 0.8 mg of sl NTG was given. The 3D data set was reconstructed in 5% intervals of the 67-82 % of the R-R cycle. Diastolic phases were analyzed on a dedicated work station using MPR, MIP and VRT modes. The patient received 80 cc of contrast. Aorta:  Normal size, minimal calcifications, no dissection. Aortic Valve:  Trileaflet, minimal calcifications. Coronary Arteries: Normal origin.  Left dominance. Left main is a large artery that has mild noncalcified plaque in the proximal segment and mild calcified plaque in the distal portion with associated stenosis 25-50%. LAD is a large artery that has  moderate diffuse mixed, predominantly calcified plaque in the proximal and mid segments with associated stenosis  50-69%. Distal LAD has only mild plaque. The first diagonal branch has only minimal plaque, the second diagonal branch has mild calcified ostial plaque. LCX artery has diffuse moderate predominantly calcified plaque in its mid portion with associated stenosis 50-69%. There is a focal spot at the distal LCX artery suspicious of stenosis close to 70%. PLA and PDA have minimal plaque. RCA is a very small non-dominant artery with no obvious plaque. IMPRESSION: 1. Coronary calcium score of 980. This was 58 percentile for age and sex matched control. 2. Normal coronary origin with left dominance. 3. Diffuse moderate plaque in the LAD and LCX arteries. An aggressive medical therapy is recommended. Ena Dawley Electronically Signed   By: Ena Dawley   On: 10/10/2015 10:55  10/10/2015  EXAM: OVER-READ INTERPRETATION  CT CHEST The following report is an over-read performed by radiologist Dr. Rebekah Chesterfield [facility] on 10/10/2015. This over-read does not include interpretation of cardiac or coronary anatomy or pathology. The coronary calcium score/coronary CTA interpretation by the cardiologist is attached. COMPARISON:  No priors. FINDINGS: In the medial aspect of the left lower lobe there is a 12 x 15 mm area of nodular architectural distortion (image 56 of series 304). This is somewhat similar in retrospect to remote prior study 06/09/2007, although slightly more bulky. 6 mm left upper lobe pulmonary nodule (image 29 of series 304). 3 mm left upper lobe nodule (image 39 of series 304). 11 x 7 mm nodule in the inferior aspect of the right upper lobe abutting the minor fissure (image 24 of series 304). No acute consolidative airspace disease. Trace bilateral pleural effusions lying dependently. Areas of ground-glass attenuation and mild interlobular septal thickening are noted throughout the lungs bilaterally, suggesting a background of mild interstitial pulmonary edema. No definite lymphadenopathy  within the visualized thorax. Visualized portions of the upper abdomen are remarkable for atherosclerosis. There are no aggressive appearing lytic or blastic lesions noted in the visualized portions of the skeleton. IMPRESSION: 1. Multiple pulmonary nodules scattered throughout the lungs bilaterally. Although the largest of these has been present on prior study dating back to 2008, it does appear slightly more bulky. This largest nodule is favored to represent an area of scarring, however, there are several other nodules that warrant attention on followup studies, and further evaluation with PET-CT is suggested at this time. 2. Evidence of mild interstitial pulmonary edema. 3. Trace bilateral pleural effusions bilaterally. These results will be called to the ordering clinician or representative by the Radiologist Assistant, and communication documented in the PACS or zVision Dashboard. Electronically Signed: By: Vinnie Langton M.D. On: 10/10/2015 09:57   Ir Angio Intra Extracran Sel Com Carotid Innominate Bilat Mod Sed  10/08/2015  CLINICAL DATA:  Left-sided weakness, leg greater than arm. Abnormal CT angiogram of the brain and neck. EXAM: BILATERAL COMMON CAROTID AND INNOMINATE ANGIOGRAPHY AND BILATERAL VERTEBRAL ARTERY ANGIOGRAMS PROCEDURE: Contrast:  60 ml 71mL OMNIPAQUE IOHEXOL 300 MG/ML  SOLN Anesthesia/Sedation:  Conscious sedation. Medications: Versed at 1 mg IV.  Fentanyl 25 mcg IV. Following a full explanation of the procedure along with the potential associated complications, an informed witnessed consent was obtained. The right groin was prepped and draped in the usual sterile fashion. Thereafter using modified Seldinger technique, transfemoral access into the right common femoral artery was obtained without difficulty. Over a 0.035 inch guidewire, a 5 French Pinnacle sheath was inserted. Through this,  and also over 0.035 inch guidewire, a 5 [place] JB 1 catheter was advanced to the aortic arch region  and selectively positioned in the right common carotid artery, the right subclavian artery, the left common carotid artery and the left subclavian artery. There were no acute complications. The patient tolerated the procedure well. FINDINGS: The right common carotid arteriogram demonstrates the right external carotid artery to be mildly narrowed proximal. Its branches are normally opacified otherwise. The right internal carotid artery has severe pre occlusive stenosis on the lateral, likely a string sign with mild poststenotic dilatation. There is mild tortuosity of the junction of the proximal 1/3 and middle 1/3 of the cervical right ICA. More distally, the vessel is seen to opacify normally to the cranial skull base. The petrous and the proximal cavernous segments are widely patent. There is an approximately 50% stenosis of the caval cavernous segment, with approximately 80-85% narrowing of the supraclinoid right ICA. The right middle cerebral artery distribution is seen to opacify into the capillary and the venous phases. Focal areas of caliber narrowing with irregularity represent intracranial arteriosclerotic disease. There is no angiographic visualization of the right anterior cerebral A1 segment. The right vertebral artery origin is patent. The vessel is seen to opacify to the cranial skull base where it opacifies the left posterior-inferior cerebellar artery and the left vertebrobasilar junction. Flow is seen into the distal basilar artery, the posterior cerebral arteries and superior cerebellar arteries. Extreme attenuation of the contrast in the proximal basilar artery is seen. The left vertebral artery origin is normal. The vessel is seen to opacify to the cranial skull base. There is an approximately 50% narrowing of the left vertebrobasilar junction just proximal to the left posterior-inferior cerebellar artery. High-grade 75 plus stenosis seen of the left vertebrobasilar junction just distal to the  origin of the left posterior inferior cerebellar artery. Additionally there is approximately 70% narrowing of the proximal basilar artery. The visualized posterior cerebral arteries, superior cerebellar arteries and the anterior-inferior cerebellar arteries including the left posterior inferior cerebellar artery demonstrated focal areas of caliber narrowing with irregularity most consistent with intracranial arteriosclerosis. The left common carotid arteriogram demonstrates the left external carotid artery and its major branches to be widely patent. The left internal carotid artery at the bulb demonstrates minimal plaque without associated ulcerations. Mild tortuosity is seen of the junction of the middle and the proximal 1/3 of the left internal carotid artery. More distally the vessel is seen to opacify to the cranial skull base. The petrous segment is widely patent. The cavernous segment demonstrates mild focal areas of caliber irregularity with approximately 50% narrowing of the distal cavernous segment. The supraclinoid segment appears widely patent. The left middle and the left anterior cerebral arteries are seen to opacify into the capillary and the venous phases. Scattered focal areas of caliber irregularity with narrowing suggest intracranial arteriosclerosis. Also seen is simultaneous opacification via the anterior communicating artery of the right anterior cerebral A2 segment and also an attenuated distal right A1 segment. There appears to be contrast leaking into the right middle cerebral artery at the proximal A1 segment. The right A2 segment demonstrates focal areas of caliber irregularity consistent with intracranial arteriosclerosis. 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 Approximately 85% narrowing of the right internal carotid artery supraclinoid segment, and 50% narrowing of the right internal carotid artery cavernous  segment. Approximately 75% stenosis of the left vertebrobasilar junction just distal to  the origin of the left posterior-inferior cerebellar artery, and approximately 70% narrowing of the proximal basilar artery. Approximately 50% narrowing of the left internal carotid artery cavernous segment. 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 Electronically Signed   By: Luanne Bras M.D.   On: 10/08/2015 11:34   Ir Angio Vertebral Sel Subclavian Innominate Bilat Mod Sed  10/08/2015  CLINICAL DATA:  Left-sided weakness, leg greater than arm. Abnormal CT angiogram of the brain and neck. EXAM: BILATERAL COMMON CAROTID AND INNOMINATE ANGIOGRAPHY AND BILATERAL VERTEBRAL ARTERY ANGIOGRAMS PROCEDURE: Contrast:  60 ml 91mL OMNIPAQUE IOHEXOL 300 MG/ML  SOLN Anesthesia/Sedation:  Conscious sedation. Medications: Versed at 1 mg IV.  Fentanyl 25 mcg IV. Following a full explanation of the procedure along with the potential associated complications, an informed witnessed consent was obtained. The right groin was prepped and draped in the usual sterile fashion. Thereafter using modified Seldinger technique, transfemoral access into the right common femoral artery was obtained without difficulty. Over a 0.035 inch guidewire, a 5 French Pinnacle sheath was inserted. Through this, and also over 0.035 inch guidewire, a 5 [place] JB 1 catheter was advanced to the aortic arch region and selectively positioned in the right common carotid artery, the right subclavian artery, the left common carotid artery and the left subclavian artery. There were no acute complications. The patient tolerated the procedure well. FINDINGS: The right common carotid arteriogram demonstrates the right external carotid artery to be mildly narrowed proximal. Its branches are normally opacified otherwise. The right internal carotid artery has severe pre occlusive stenosis on the lateral, likely a string sign with mild poststenotic  dilatation. There is mild tortuosity of the junction of the proximal 1/3 and middle 1/3 of the cervical right ICA. More distally, the vessel is seen to opacify normally to the cranial skull base. The petrous and the proximal cavernous segments are widely patent. There is an approximately 50% stenosis of the caval cavernous segment, with approximately 80-85% narrowing of the supraclinoid right ICA. The right middle cerebral artery distribution is seen to opacify into the capillary and the venous phases. Focal areas of caliber narrowing with irregularity represent intracranial arteriosclerotic disease. There is no angiographic visualization of the right anterior cerebral A1 segment. The right vertebral artery origin is patent. The vessel is seen to opacify to the cranial skull base where it opacifies the left posterior-inferior cerebellar artery and the left vertebrobasilar junction. Flow is seen into the distal basilar artery, the posterior cerebral arteries and superior cerebellar arteries. Extreme attenuation of the contrast in the proximal basilar artery is seen. The left vertebral artery origin is normal. The vessel is seen to opacify to the cranial skull base. There is an approximately 50% narrowing of the left vertebrobasilar junction just proximal to the left posterior-inferior cerebellar artery. High-grade 75 plus stenosis seen of the left vertebrobasilar junction just distal to the origin of the left posterior inferior cerebellar artery. Additionally there is approximately 70% narrowing of the proximal basilar artery. The visualized posterior cerebral arteries, superior cerebellar arteries and the anterior-inferior cerebellar arteries including the left posterior inferior cerebellar artery demonstrated focal areas of caliber narrowing with irregularity most consistent with intracranial arteriosclerosis. The left common carotid arteriogram demonstrates the left external carotid artery and its major branches  to be widely patent. The left internal carotid artery at the bulb demonstrates minimal plaque without associated ulcerations. Mild tortuosity is seen of the junction of the middle and the proximal 1/3 of the left internal carotid artery. More distally  the vessel is seen to opacify to the cranial skull base. The petrous segment is widely patent. The cavernous segment demonstrates mild focal areas of caliber irregularity with approximately 50% narrowing of the distal cavernous segment. The supraclinoid segment appears widely patent. The left middle and the left anterior cerebral arteries are seen to opacify into the capillary and the venous phases. Scattered focal areas of caliber irregularity with narrowing suggest intracranial arteriosclerosis. Also seen is simultaneous opacification via the anterior communicating artery of the right anterior cerebral A2 segment and also an attenuated distal right A1 segment. There appears to be contrast leaking into the right middle cerebral artery at the proximal A1 segment. The right A2 segment demonstrates focal areas of caliber irregularity consistent with intracranial arteriosclerosis. 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 Approximately 85% narrowing of the right internal carotid artery supraclinoid segment, and 50% narrowing of the right internal carotid artery cavernous segment. Approximately 75% stenosis of the left vertebrobasilar junction just distal to the origin of the left posterior-inferior cerebellar artery, and approximately 70% narrowing of the proximal basilar artery. Approximately 50% narrowing of the left internal carotid artery cavernous segment. 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 Electronically Signed   By: Luanne Bras M.D.   On: 10/08/2015 11:34    Microbiology: Recent Results (from the past 240 hour(s))  Surgical pcr screen      Status: None   Collection Time: 10/10/15  6:32 AM  Result Value Ref Range Status   MRSA, PCR NEGATIVE NEGATIVE Final   Staphylococcus aureus NEGATIVE NEGATIVE Final    Comment:        The Xpert SA Assay (FDA approved for NASAL specimens in patients over 28 years of age), is one component of a comprehensive surveillance program.  Test performance has been validated by [facility] for patients greater than or equal to 53 year old. It is not intended to diagnose infection nor to guide or monitor treatment.      Labs: Basic Metabolic Panel:  Recent Labs Lab 10/10/15 0552 10/11/15 0400 10/12/15 0402 10/14/15 0808  NA 139 136 141  --   K 3.7 4.0 3.8 3.5  CL 107 106 106  --   CO2 22 23 27   --   GLUCOSE 95 113* 101*  --   BUN 19 15 14   --   CREATININE 1.11 1.08 0.97  --   CALCIUM 8.7* 8.1* 8.5*  --   MG  --   --   --  1.7   Liver Function Tests:  Recent Labs Lab 10/12/15 0402  AST 23  ALT 13*  ALKPHOS 42  BILITOT 0.4  PROT 6.9  ALBUMIN 3.0*   No results for input(s): LIPASE, AMYLASE in the last 168 hours.  Recent Labs Lab 10/12/15 0402  AMMONIA 27   CBC:  Recent Labs Lab 10/08/15 0007 10/10/15 0552 10/11/15 0400 10/12/15 0402  WBC 5.6 5.8 8.7 7.7  HGB 12.2* 11.4* 10.1* 9.6*  HCT 38.4* 35.9* 32.0* 31.7*  MCV 68.4* 68.0* 67.8* 68.6*  PLT 178 165 164 171   Cardiac Enzymes: No results for input(s): CKTOTAL, CKMB, CKMBINDEX, TROPONINI in the last 168 hours. BNP: BNP (last 3 results)  Recent Labs  06/19/15 1039  BNP 301.8*    ProBNP (last 3 results) No results for input(s): PROBNP in the last 8760 hours.  CBG:  Recent Labs Lab 10/11/15 1536  GLUCAP 138*  Signed:  Louellen Molder MD.  [facility] 10/14/2015, 3:00 PM

## 2015-10-14 NOTE — Progress Notes (Signed)
Occupational Therapy Treatment Patient Details Name: NISCHAL OCH MRN: [id number] DOB: [birth year] Today's Date: 10/14/2015    History of present illness Pt is a 75 yo male admitted with L leg weakness and numbness.  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  Pt had RCEA.  Pt with prior h/o CVA and MI.     OT comments  Pt is progresing, but he continues with impaired balance.  He lost his balance while performing LB ADLs requiring mod A to recover and prevent fall.  Feel optimal discharge plan is CIR to allow him to return home safely and reduce risk of readmission due to falls.   Follow Up Recommendations  CIR;Supervision/Assistance - 24 hour    Equipment Recommendations  3 in 1 bedside comode    Recommendations for Other Services      Precautions / Restrictions Precautions Precautions: Fall       Mobility Bed Mobility Overal bed mobility: Needs Assistance Bed Mobility: Supine to Sit     Supine to sit: Supervision        Transfers Overall transfer level: Needs assistance Equipment used: 1 person hand held assist Transfers: Sit to/from Stand;Stand Pivot Transfers Sit to Stand: Min assist Stand pivot transfers: Min assist            Balance Overall balance assessment: Needs assistance Sitting-balance support: Feet supported Sitting balance-Leahy Scale: Good     Standing balance support: During functional activity Standing balance-Leahy Scale: Poor Standing balance comment: Pt with LOB during ADLs, required mod A to recover and prevent fall                    ADL Overall ADL's : Needs assistance/impaired     Grooming: Wash/dry hands;Wash/dry face;Oral care;Brushing hair;Sitting;Set up;Standing;Minimal assistance Grooming Details (indicate cue type and reason): Min a for balance in standing.  set up assist sitting  Upper Body Bathing: Set up;Supervision/ safety;Sitting   Lower Body Bathing: Moderate  assistance;Sit to/from stand Lower Body Bathing Details (indicate cue type and reason): Pt with posterior LOB requiring mod A to recover  Upper Body Dressing : Minimal assistance Upper Body Dressing Details (indicate cue type and reason): due to lines  Lower Body Dressing: Moderate assistance;Sit to/from stand Lower Body Dressing Details (indicate cue type and reason): Pt with LOB.  required mod A to recover  Toilet Transfer: Minimal assistance;Ambulation;Comfort height toilet;RW   Toileting- Clothing Manipulation and Hygiene: Minimal assistance;Sit to/from stand       Functional mobility during ADLs: Minimal assistance;Moderate assistance General ADL Comments: Pt requires assist for balance       Vision                     Perception     Praxis      Cognition   Behavior During Therapy: WFL for tasks assessed/performed Overall Cognitive Status: Within Functional Limits for tasks assessed (No deficits noted during ADL tasks )                       Extremity/Trunk Assessment               Exercises     Shoulder Instructions       General Comments      Pertinent Vitals/ Pain       Pain Assessment: No/denies pain  Home Living  Prior Functioning/Environment              Frequency Min 3X/week     Progress Toward Goals  OT Goals(current goals can now be found in the care plan section)  Progress towards OT goals: Progressing toward goals  ADL Goals Pt Will Perform Grooming: with supervision;standing Pt Will Perform Lower Body Bathing: with supervision;sit to/from stand Pt Will Perform Lower Body Dressing: with supervision;sit to/from stand Pt Will Perform Tub/Shower Transfer: Tub transfer;shower seat;ambulating;with supervision Pt/caregiver will Perform Home Exercise Program: Left upper extremity;With Supervision;With written HEP provided;With theraputty Additional ADL Goal #1:  Pt will walk to bathroom and toilet on 3:1 over commode with S.  Plan Discharge plan remains appropriate    Co-evaluation                 End of Session Equipment Utilized During Treatment: Gait belt   Activity Tolerance Patient tolerated treatment well   Patient Left in chair;with call bell/phone within reach;with chair alarm set   Nurse Communication Mobility status        Time: [id number] OT Time Calculation (min): 62 min  Charges: OT General Charges $OT Visit: 1 Procedure OT Treatments $Self Care/Home Management : 53-67 mins  Anaeli Cornwall M 10/14/2015, 1:18 PM

## 2015-10-14 NOTE — Interval H&P Note (Signed)
[name] was admitted today to Inpatient Rehabilitation with the diagnosis of right parietal occipital lobe infarct.  The patient's history has been reviewed, patient examined, and there is no change in status.  Patient continues to be appropriate for intensive inpatient rehabilitation.  I have reviewed the patient's chart and labs.  Questions were answered to the patient's satisfaction. The PAPE has been reviewed and assessment remains appropriate.  Ankit Lorie Phenix 10/14/2015, 8:34 PM

## 2015-10-14 NOTE — H&P (Signed)
Physical Medicine and Rehabilitation Admission H&P    Chief Complaint  Patient presents with  . Numbness  . Abdominal Pain  : HPI: [name] is a 75 y.o. right handed male with history of hypertension, hyperlipidemia, coronary artery disease maintained on aspirin 81 mg daily, PAF. Patient lives alone and independent prior to admission and driving but he does have a significant other that helps him when needed but cannot provide 24-hour assistance. One level home with 3 steps to entry. He has a brother in [place] but again cannot provide 24-hour care. Presented 10/07/2015 with left-sided weakness. Blood pressure 220/100. Troponin 0.02. MRI of the brain showed patchy acute ischemic nonhemorrhagic infarct involving the cortical gray matter and deep white matter of the right frontal parietal and occipital lobes. Echocardiogram with ejection fraction of 60%. Normal systolic function. Patient did not receive TPA. CTA of head and neck showed stenosis at the right ICA origin related to low density plaque or thrombus. Superimposed severe intracranial atherosclerotic type changes including high-grade distal right ICA stenosis. Cerebral angiogram showed preocclusive stenosis right ICA proximal. 80% percent stenosis right ICA supraclinoid segment 50% cavernous segment. Vascular surgery consult that underwent right carotid endarterectomy 10/10/2015 per Dr. Trula Slade. Neurology consulted And maintained on Eliquis and low-dose aspirin for CVA prophylaxis.  Tolerating a regular consistency diet. Physical and  occupational therapy evaluation completed 10/11/2015 with recommendations of physical medicine and rehabilitation consult.Patient was admitted for comprehensive rehabilitation program  ROS Constitutional: Negative for fever and chills.  HENT:   HOH  Eyes: Negative for blurred vision and double vision.  Respiratory: Negative for cough and shortness of breath.  Cardiovascular: Negative for chest  pain and leg swelling.  Gastrointestinal: Positive for constipation. Negative for nausea and vomiting.  Genitourinary: Negative for dysuria.  Musculoskeletal: Positive for myalgias and joint pain.  Skin: Negative for rash.  Neurological: Positive for weakness. Negative for seizures, loss of consciousness and headaches.  Psychiatric/Behavioral: Positive for memory loss.  All other systems reviewed and are negative  Past Medical History  Diagnosis Date  . Hypertension     echocardiogram 6/10: Moderate LVH, EF 55-65%, mild, mild MR, MAC, mild LAE, PASP 32  . Dyslipidemia   . History of stroke   . CAD (coronary artery disease)     cath in 1/08: EF 60%, mild dilated Ao root, oD2 30%, LAD 20-30%.  . Paroxysmal atrial fibrillation ([place])   . Anemia, iron deficiency 08/15/2011  . History of pneumonia 08/15/2011  . History of MI (myocardial infarction) 08/15/2011  . Dilated aortic root ([place]) 08/15/2011  . Stroke ([place]) 10/2011  . Myocardial infarction ([place])   . Hyperlipidemia   . Shortness of breath dyspnea   . Prediabetes 10/08/2015  . Preoperative cardiovascular examination 10/09/2015   Past Surgical History  Procedure Laterality Date  . Cardiac catheterization    . Endarterectomy Right 10/10/2015    Procedure: ENDARTERECTOMY CAROTID-RIGHT;  Surgeon: Serafina Mitchell, MD;  Location: [facility] OR;  Service: Vascular;  Laterality: Right;   Family History  Problem Relation Age of Onset  . Colon cancer Neg Hx   . Aneurysm Sister    Social History:  reports that he has never smoked. He has never used smokeless tobacco. He reports that he does not drink alcohol or use illicit drugs. Allergies: No Known Allergies Medications Prior to Admission  Medication Sig Dispense Refill  . albuterol (PROVENTIL HFA;VENTOLIN HFA) 108 (90 BASE) MCG/ACT inhaler Inhale 2 puffs into the lungs every 6 (six)  hours as needed for wheezing or shortness of breath. (Patient not taking: Reported on 10/07/2015) 1 Inhaler 2  .  amitriptyline (ELAVIL) 50 MG tablet Take 1 tablet (50 mg total) by mouth at bedtime as needed for sleep. (Patient not taking: Reported on 01/21/2015) 90 tablet 1  . amLODipine (NORVASC) 10 MG tablet Take 1 tablet (10 mg total) by mouth daily. (Patient not taking: Reported on 06/19/2015) 30 tablet 0  . aspirin EC 81 MG tablet Take 1 tablet (81 mg total) by mouth daily. (Patient not taking: Reported on 10/07/2015) 30 tablet 0  . atorvastatin (LIPITOR) 80 MG tablet Take 1 tablet (80 mg total) by mouth daily. (Patient not taking: Reported on 01/21/2015) 90 tablet 3  . carvedilol (COREG) 3.125 MG tablet Take 1 tablet (3.125 mg total) by mouth 2 (two) times daily with a meal. (Patient not taking: Reported on 10/07/2015) 60 tablet 0  . furosemide (LASIX) 20 MG tablet Take 1 tablet (20 mg total) by mouth daily. (Patient not taking: Reported on 10/07/2015) 30 tablet 0  . isosorbide mononitrate (IMDUR) 30 MG 24 hr tablet Take 1 tablet (30 mg total) by mouth daily. (Patient not taking: Reported on 10/07/2015) 30 tablet 0  . lisinopril (PRINIVIL,ZESTRIL) 5 MG tablet Take 1 tablet (5 mg total) by mouth daily. (Patient not taking: Reported on 10/07/2015) 30 tablet 0  . tadalafil (CIALIS) 20 MG tablet Take 1 tablet (20 mg total) by mouth daily as needed for erectile dysfunction. (Patient not taking: Reported on 01/21/2015) 5 tablet 11    Home: Home Living Family/patient expects to be discharged to:: Private residence Living Arrangements: Other relatives (grandson) Available Help at Discharge: Friend(s), Family (may be able to stay with brother if needed?) Type of Home: House Home Access: Stairs to enter Technical brewer of Steps: 4 Entrance Stairs-Rails: Left Home Layout: One level Bathroom Shower/Tub: Tub/shower unit, Architectural technologist: Standard Home Equipment: None Additional Comments: Might stay at brother's house at d/c, he lives in [place].  Family visiting pt today and pt will begin converasation  with family about assist avaialble at d/c and where he might go.   Functional History: Prior Function Level of Independence: Independent Comments: Pt drives, cooks, cleans etc.  Functional Status:  Mobility: Bed Mobility Overal bed mobility: Needs Assistance Bed Mobility: Supine to Sit Supine to sit: Min guard, HOB elevated General bed mobility comments: Use of bed rail and increased time. Cues to scoot to EOB for improved balance w/ feet on floor. Transfers Overall transfer level: Needs assistance Equipment used: 1 person hand held assist Transfers: Sit to/from Stand Sit to Stand: Min assist Stand pivot transfers: Min assist General transfer comment: Pt requires min assist to steady due to weakness and ataxia in Lt LE.  Cues to stand upright. Ambulation/Gait Ambulation/Gait assistance: Min assist Ambulation Distance (Feet): 100 Feet Assistive device: 1 person hand held assist Gait Pattern/deviations: Ataxic, Trunk flexed, Staggering left, Staggering right General Gait Details: Lt LE ataxia and pt reached out for armrail and bed in hallway to steady.  Min assist to steady as pt staggers Lt and Rt.  Cues for upright posture as pt looking down at feet. Gait velocity interpretation: Below normal speed for age/gender    ADL: ADL Overall ADL's : Needs assistance/impaired Eating/Feeding: Set up, Sitting Eating/Feeding Details (indicate cue type and reason): needed assist opening small packages. Grooming: Wash/dry hands, Wash/dry face, Oral care, Minimal assistance, Sitting Grooming Details (indicate cue type and reason): min assist at times for small  fine motor tasks but with cues could perform on own. Upper Body Bathing: Minimal assitance, Cueing for sequencing, Sitting Upper Body Bathing Details (indicate cue type and reason): cues to stay on task. Lower Body Bathing: Minimal assistance, Sit to/from stand, Cueing for sequencing Lower Body Bathing Details (indicate cue type and  reason): min assist when on his feet due to weakness in LLE.  Pt not fully steady on his feet in standing. Upper Body Dressing : Set up, Sitting Upper Body Dressing Details (indicate cue type and reason): assist with buttons and fastners. Lower Body Dressing: Minimal assistance, Sit to/from stand, Cueing for sequencing Lower Body Dressing Details (indicate cue type and reason): Min assist to stand and fasten pants and keep balance. Toilet Transfer: Minimal assistance, Ambulation, Grab bars, Regular Toilet Toilet Transfer Details (indicate cue type and reason): walked to toilet Toileting- Clothing Manipulation and Hygiene: Supervision/safety, Sitting/lateral lean Functional mobility during ADLs: Minimal assistance General ADL Comments: Pt requires min assist for most adls due to decreased balance in standing and mild cognitive deficits.    Cognition: Cognition Overall Cognitive Status: Impaired/Different from baseline Arousal/Alertness: Awake/alert Orientation Level: Oriented X4 Attention: Sustained Sustained Attention: Appears intact Memory: Impaired Memory Impairment: Decreased recall of new information Awareness: Impaired Awareness Impairment: Emergent impairment Problem Solving: Impaired Problem Solving Impairment: Functional basic Safety/Judgment: Appears intact Cognition Arousal/Alertness: Awake/alert Behavior During Therapy: WFL for tasks assessed/performed Overall Cognitive Status: Impaired/Different from baseline Area of Impairment: Memory, Problem solving Memory: Decreased short-term memory Problem Solving: Slow processing General Comments: Overall, pt performed well with basic safety questions etc. Pt just appears a little "off" when answering questions.  Pt had difficulty answering problem solving questions directly but eventually would come to correct answer with cues.  Physical Exam: Blood pressure 174/85, pulse 75, temperature 97 F (36.1 C), temperature source  Axillary, resp. rate 20, height 5\' 6"  (1.676 m), weight 78.291 kg (172 lb 9.6 oz), SpO2 92 %. Physical Exam Constitutional: He appears well-developed and well-nourished.  HENT:  Head: Normocephalic and atraumatic.  Eyes: Conjunctivae and EOM are normal.  Neck: Normal range of motion. Neck supple. No thyromegaly present.  Cardiovascular: Normal rate and regular rhythm.  Respiratory: Effort normal and breath sounds normal. No respiratory distress.  GI: Soft. Bowel sounds are normal. He exhibits no distension.  Musculoskeletal: He exhibits no edema or tenderness.  Neurological: He is alert.  Mood is a bit flat but appropriate.  He has made good eye contact with examiner and follows simple commands.  Noted some delay in processing but appropriate for name, age and place.  He does have awareness of his deficits Sensation intact to light touch DTRs symmetric Motor: b/l UE 4+/5, B/l LE 5/5 (right slightly stronger than left)  Mild ataxia LUE>RUE  Skin: Skin is warm and dry.  Psychiatric: He has a normal mood and affect. His behavior is normal   Results for orders placed or performed during the hospital encounter of 10/06/15 (from the past 48 hour(s))  Potassium     Status: None   Collection Time: 10/14/15  8:08 AM  Result Value Ref Range   Potassium 3.5 3.5 - 5.1 mmol/L  Magnesium     Status: None   Collection Time: 10/14/15  8:08 AM  Result Value Ref Range   Magnesium 1.7 1.7 - 2.4 mg/dL   No results found.   Medical Problem List and Plan: 1.  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  DVT Prophylaxis/Anticoagulation: Eliquis 3. Pain Management:  Oxycodone as needed 4. Symptomatic right carotid stenosis. 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 Follow-up vascular surgery as needed 5. Neuropsych: This patient is capable of making decisions on his own behalf. 6. Skin/Wound Care: Routine skin checks 7. Fluids/Electrolytes/Nutrition: Routine I&O  with follow-up chemistries 8. Hypertension/PAF. Norvasc 10 mg daily, Coreg 3.125 mg twice a day, Lasix 20 mg daily. Monitor with increased mobility 9. Coronary artery disease. No chest pain or shortness of breath. Patient currently remains on low dose aspirin 10. Hyperlipidemia. Lipitor/zetia 11. BPH. Flomax 0.4 mg daily. Check PVR 3  Post Admission Physician Evaluation: 1. Functional deficits secondary  to right parietal occipital lobe infarct. 2. Patient is admitted to receive collaborative, interdisciplinary care between the physiatrist, rehab nursing staff, and therapy team. 3. Patient's level of medical complexity and substantial therapy needs in context of that medical necessity cannot be provided at a lesser intensity of care such as a SNF. 4. Patient has experienced substantial functional loss from his/her baseline which was documented above under the "Functional History" and "Functional Status" headings.  Judging by the patient's diagnosis, physical exam, and functional history, the patient has potential for functional progress which will result in measurable gains while on inpatient rehab.  These gains will be of substantial and practical use upon discharge  in facilitating mobility and self-care at the household level. 5. Physiatrist will provide 24 hour management of medical needs as well as oversight of the therapy plan/treatment and provide guidance as appropriate regarding the interaction of the two. 6. 24 hour rehab nursing will assist with bladder management, safety, disease management, medication administration and patient education and help integrate therapy concepts, techniques,education, etc. 7. PT will assess and treat for/with: Lower extremity strength, range of motion, stamina, balance, functional mobility, safety, adaptive techniques and equipment, coping skills, pain control, stroke education.   Goals are: Mod I. 8. OT will assess and treat for/with: ADL's, functional  mobility, safety, upper extremity strength, adaptive techniques and equipment, ego support, and community reintegration.   Goals are: Mod I. Therapy may proceed with showering this patient. 9. Case Management and Social Worker will assess and treat for psychological issues and discharge planning. 10. Team conference will be held weekly to assess progress toward goals and to determine barriers to discharge. 11. Patient will receive at least 3 hours of therapy per day at least 5 days per week. 12. ELOS: 12-15 days.       13. Prognosis:  good  Delice Lesch, MD 10/14/2015

## 2015-10-14 NOTE — Progress Notes (Signed)
Rehab admissions - I met with patient this am.  He lives alone.  I explained that he has [employer] and will have a copay of $430 a day for the first 4 days.  Patient is very concerned about copays.  He wants to think about what he should do for rehab.  I have called [facility] insurance carrier and have faxed information requesting acute inpatient rehab admission.  Once I have insurance decision, I will speak with patient again to see what he would like to do about his rehab needs.  Call me for questions.  #[phone]

## 2015-10-14 NOTE — Progress Notes (Signed)
Ankit Lorie Phenix, MD Physician Signed Physical Medicine and Rehabilitation Consult Note 10/11/2015 11:58 AM  Related encounter: ED to Hosp-Admission (Current) from 10/06/2015 in [place] Collapse All        Physical Medicine and Rehabilitation Consult Reason for Consult: Right parietal and right occipital infarcts Referring Physician: [facility]   HPI: [name] is a 75 y.o. right handed male with history of hypertension, hyperlipidemia, coronary artery disease maintained on aspirin 81 mg daily, PAF. Patient lives alone and independent prior to admission and driving but he does have a significant other that helps him when needed but cannot provide 24-hour assistance. One level home with 3 steps to entry. He has a brother in [place] but again cannot provide 24-hour care. Presented 10/07/2015 with left-sided weakness. Blood pressure 220/100. Troponin 0.02. MRI of the brain showed patchy acute ischemic nonhemorrhagic infarct involving the cortical gray matter and deep white matter of the right frontal parietal and occipital lobes. Echocardiogram with ejection fraction of 60%. Normal systolic function. Patient did not receive TPA. CTA of head and neck showed stenosis at the right ICA origin related to low density plaque or thrombus. Superimposed severe intracranial atherosclerotic type changes including high-grade distal right ICA stenosis. Cerebral angiogram showed preocclusive stenosis right ICA proximal. 80% percent stenosis right ICA supraclinoid segment 50% cavernous segment. Vascular surgery consult that underwent right carotid endarterectomy 10/10/2015 per Dr. Trula Slade. Neurology consulted presently remains on aspirin 81 mg daily. Subcutaneous Lovenox added for DVT prophylaxis. Tolerating a regular consistency diet. Physical occupational therapy evaluation completed 10/11/2015 with recommendations of physical medicine and rehabilitation  consult.   Review of Systems  Constitutional: Negative for fever and chills.  HENT:   HOH  Eyes: Negative for blurred vision and double vision.  Respiratory: Negative for cough and shortness of breath.  Cardiovascular: Positive for palpitations. Negative for chest pain and leg swelling.  Gastrointestinal: Positive for constipation. Negative for nausea and vomiting.  Genitourinary: Negative for dysuria.  Musculoskeletal: Positive for myalgias and joint pain.  Skin: Negative for rash.  Neurological: Positive for weakness. Negative for seizures, loss of consciousness and headaches.  Psychiatric/Behavioral: Positive for memory loss.  All other systems reviewed and are negative.  Past Medical History  Diagnosis Date  . Hypertension     echocardiogram 6/10: Moderate LVH, EF 55-65%, mild, mild MR, MAC, mild LAE, PASP 32  . Dyslipidemia   . History of stroke   . CAD (coronary artery disease)     cath in 1/08: EF 60%, mild dilated Ao root, oD2 30%, LAD 20-30%.  . Paroxysmal atrial fibrillation ([place])   . Anemia, iron deficiency 08/15/2011  . History of pneumonia 08/15/2011  . History of MI (myocardial infarction) 08/15/2011  . Dilated aortic root ([place]) 08/15/2011  . Stroke ([place]) 10/2011  . Myocardial infarction ([place])   . Hyperlipidemia   . Shortness of breath dyspnea   . Prediabetes 10/08/2015  . Preoperative cardiovascular examination 10/09/2015   Past Surgical History  Procedure Laterality Date  . Cardiac catheterization    . Endarterectomy Right 10/10/2015    Procedure: ENDARTERECTOMY CAROTID-RIGHT; Surgeon: Serafina Mitchell, MD; Location: [facility] OR; Service: Vascular; Laterality: Right;   Family History  Problem Relation Age of Onset  . Colon cancer Neg Hx   . Aneurysm Sister    Social History:  reports that he has never smoked. He has never used smokeless tobacco. He reports that he does not drink  alcohol or  use illicit drugs. Allergies: No Known Allergies Medications Prior to Admission  Medication Sig Dispense Refill  . albuterol (PROVENTIL HFA;VENTOLIN HFA) 108 (90 BASE) MCG/ACT inhaler Inhale 2 puffs into the lungs every 6 (six) hours as needed for wheezing or shortness of breath. (Patient not taking: Reported on 10/07/2015) 1 Inhaler 2  . amitriptyline (ELAVIL) 50 MG tablet Take 1 tablet (50 mg total) by mouth at bedtime as needed for sleep. (Patient not taking: Reported on 01/21/2015) 90 tablet 1  . amLODipine (NORVASC) 10 MG tablet Take 1 tablet (10 mg total) by mouth daily. (Patient not taking: Reported on 06/19/2015) 30 tablet 0  . aspirin EC 81 MG tablet Take 1 tablet (81 mg total) by mouth daily. (Patient not taking: Reported on 10/07/2015) 30 tablet 0  . atorvastatin (LIPITOR) 80 MG tablet Take 1 tablet (80 mg total) by mouth daily. (Patient not taking: Reported on 01/21/2015) 90 tablet 3  . carvedilol (COREG) 3.125 MG tablet Take 1 tablet (3.125 mg total) by mouth 2 (two) times daily with a meal. (Patient not taking: Reported on 10/07/2015) 60 tablet 0  . furosemide (LASIX) 20 MG tablet Take 1 tablet (20 mg total) by mouth daily. (Patient not taking: Reported on 10/07/2015) 30 tablet 0  . isosorbide mononitrate (IMDUR) 30 MG 24 hr tablet Take 1 tablet (30 mg total) by mouth daily. (Patient not taking: Reported on 10/07/2015) 30 tablet 0  . lisinopril (PRINIVIL,ZESTRIL) 5 MG tablet Take 1 tablet (5 mg total) by mouth daily. (Patient not taking: Reported on 10/07/2015) 30 tablet 0  . tadalafil (CIALIS) 20 MG tablet Take 1 tablet (20 mg total) by mouth daily as needed for erectile dysfunction. (Patient not taking: Reported on 01/21/2015) 5 tablet 11    Home: Home Living Family/patient expects to be discharged to:: Private residence Living Arrangements: Alone Available Help at Discharge: Friend(s), Family (may be able to stay with brother if  needed?) Type of Home: House Home Access: Stairs to enter [employer] of Steps: 4 Entrance Stairs-Rails: Left Home Layout: One level Bathroom Shower/Tub: Tub/shower unit, Architectural technologist: Standard Home Equipment: None Additional Comments: Might stay at brother's house at d/c, he lives in [place]. Family visiting pt today and pt will begin converasation with family about assist avaialble at d/c and where he might go.  Functional History: Prior Function Level of Independence: Independent Comments: Pt drives, cooks, cleans etc. Functional Status:  Mobility: Bed Mobility Overal bed mobility: Needs Assistance Bed Mobility: Supine to Sit Supine to sit: Min guard, HOB elevated General bed mobility comments: Use of bed rail and increased time. Cues to scoot to EOB for improved balance w/ feet on floor. Transfers Overall transfer level: Needs assistance Equipment used: 1 person hand held assist Transfers: Sit to/from Stand Sit to Stand: Min assist Stand pivot transfers: Min assist General transfer comment: Pt requires min assist to steady due to weakness and ataxia in Lt LE. Cues to stand upright. Ambulation/Gait Ambulation/Gait assistance: Min assist Ambulation Distance (Feet): 100 Feet Assistive device: 1 person hand held assist Gait Pattern/deviations: Ataxic, Trunk flexed, Staggering left, Staggering right General Gait Details: Lt LE ataxia and pt reached out for armrail and bed in hallway to steady. Min assist to steady as pt staggers Lt and Rt. Cues for upright posture as pt looking down at feet. Gait velocity interpretation: Below normal speed for age/gender    ADL: ADL Overall ADL's : Needs assistance/impaired Eating/Feeding: Set up, Sitting Eating/Feeding Details (indicate cue type and reason): needed  assist opening small packages. Grooming: Wash/dry hands, Wash/dry face, Oral care, Minimal assistance, Sitting Grooming Details (indicate cue type  and reason): min assist at times for small fine motor tasks but with cues could perform on own. Upper Body Bathing: Minimal assitance, Cueing for sequencing, Sitting Upper Body Bathing Details (indicate cue type and reason): cues to stay on task. Lower Body Bathing: Minimal assistance, Sit to/from stand, Cueing for sequencing Lower Body Bathing Details (indicate cue type and reason): min assist when on his feet due to weakness in LLE. Pt not fully steady on his feet in standing. Upper Body Dressing : Set up, Sitting Upper Body Dressing Details (indicate cue type and reason): assist with buttons and fastners. Lower Body Dressing: Minimal assistance, Sit to/from stand, Cueing for sequencing Lower Body Dressing Details (indicate cue type and reason): Min assist to stand and fasten pants and keep balance. Toilet Transfer: Minimal assistance, Ambulation, Grab bars, Regular Toilet Toilet Transfer Details (indicate cue type and reason): walked to toilet Toileting- Clothing Manipulation and Hygiene: Supervision/safety, Sitting/lateral lean Functional mobility during ADLs: Minimal assistance General ADL Comments: Pt requires min assist for most adls due to decreased balance in standing and mild cognitive deficits.   Cognition: Cognition Overall Cognitive Status: Impaired/Different from baseline Arousal/Alertness: Awake/alert Orientation Level: Oriented to person, Oriented to place, Oriented to time, Oriented to situation Attention: Sustained Sustained Attention: Appears intact Memory: Impaired Memory Impairment: Decreased recall of new information Awareness: Impaired Awareness Impairment: Emergent impairment Problem Solving: Impaired Problem Solving Impairment: Functional basic Safety/Judgment: Appears intact Cognition Arousal/Alertness: Awake/alert Behavior During Therapy: WFL for tasks assessed/performed Overall Cognitive Status: Impaired/Different from baseline Area of Impairment:  Memory, Problem solving Memory: Decreased short-term memory Problem Solving: Slow processing General Comments: Overall, pt performed well with basic safety questions etc. Pt just appears a little "off" when answering questions. Pt had difficulty answering problem solving questions directly but eventually would come to correct answer with cues.  Blood pressure 191/90, pulse 89, temperature 97.7 F (36.5 C), temperature source Oral, resp. rate 13, height 5\' 6"  (1.676 m), weight 78.291 kg (172 lb 9.6 oz), SpO2 100 %. Physical Exam  Vitals reviewed. Constitutional: He appears well-developed and well-nourished.  HENT:  Head: Normocephalic and atraumatic.  Eyes: Conjunctivae and EOM are normal.  Neck: Normal range of motion. Neck supple. No thyromegaly present.  Cardiovascular: Normal rate and regular rhythm.  Respiratory: Effort normal and breath sounds normal. No respiratory distress.  GI: Soft. Bowel sounds are normal. He exhibits no distension.  Musculoskeletal: He exhibits no edema or tenderness.  Neurological: He is alert.  Mood is a bit flat but appropriate.  He has made good eye contact with examiner and follows simple commands.  Noted some delay in processing but appropriate for name, age and place.  He does have awareness of his deficits Sensation intact to light touch DTRs symmetric Motor: b/l UE 4+/5, B/l LE 5/5 (right slightly stronger than left)  Mild ataxia LUE>RUE  Skin: Skin is warm and dry.  Psychiatric: He has a normal mood and affect. His behavior is normal.    Lab Results Last 24 Hours    Results for orders placed or performed during the hospital encounter of 10/06/15 (from the past 24 hour(s))  Type and screen [place] Status: None   Collection Time: 10/10/15 1:14 PM  Result Value Ref Range   ABO/RH(D) B POS    Antibody Screen NEG    Sample Expiration 10/13/2015   ABO/Rh Status: None  Collection Time:  10/10/15 1:14 PM  Result Value Ref Range   ABO/RH(D) B POS   CBC Status: Abnormal   Collection Time: 10/11/15 4:00 AM  Result Value Ref Range   WBC 8.7 4.0 - 10.5 K/uL   RBC 4.72 4.22 - 5.81 MIL/uL   Hemoglobin 10.1 (L) 13.0 - 17.0 g/dL   HCT 32.0 (L) 39.0 - 52.0 %   MCV 67.8 (L) 78.0 - 100.0 fL   MCH 21.4 (L) 26.0 - 34.0 pg   MCHC 31.6 30.0 - 36.0 g/dL   RDW 15.9 (H) 11.5 - 15.5 %   Platelets 164 150 - 400 K/uL  Basic metabolic panel Status: Abnormal   Collection Time: 10/11/15 4:00 AM  Result Value Ref Range   Sodium 136 135 - 145 mmol/L   Potassium 4.0 3.5 - 5.1 mmol/L   Chloride 106 101 - 111 mmol/L   CO2 23 22 - 32 mmol/L   Glucose, Bld 113 (H) 65 - 99 mg/dL   BUN 15 6 - 20 mg/dL   Creatinine, Ser 1.08 0.61 - 1.24 mg/dL   Calcium 8.1 (L) 8.9 - 10.3 mg/dL   GFR calc non Af Amer >60 >60 mL/min   GFR calc Af Amer >60 >60 mL/min   Anion gap 7 5 - 15      Imaging Results (Last 48 hours)    Ct Coronary Morp W/cta Cor W/score W/ca W/cm &/or Wo/cm  10/10/2015 ADDENDUM REPORT: 10/10/2015 10:55 CLINICAL DATA: Chest pain EXAM: Cardiac/Coronary CT TECHNIQUE: The patient was scanned on a Philips 256 scanner. FINDINGS: A 120 kV prospective scan was triggered in the descending thoracic aorta at 111 HU's. Axial non-contrast 3 mm slices were carried out through the heart. The data set was analyzed on a dedicated work station and scored using the [place]. Gantry rotation speed was 270 msecs and collimation was .9 mm. 5 mg of iv Metoprolol and 0.8 mg of sl NTG was given. The 3D data set was reconstructed in 5% intervals of the 67-82 % of the R-R cycle. Diastolic phases were analyzed on a dedicated work station using MPR, MIP and VRT modes. The patient received 80 cc of contrast. Aorta: Normal size, minimal calcifications, no dissection. Aortic Valve: Trileaflet, minimal  calcifications. Coronary Arteries: Normal origin. Left dominance. Left main is a large artery that has mild noncalcified plaque in the proximal segment and mild calcified plaque in the distal portion with associated stenosis 25-50%. LAD is a large artery that has moderate diffuse mixed, predominantly calcified plaque in the proximal and mid segments with associated stenosis 50-69%. Distal LAD has only mild plaque. The first diagonal branch has only minimal plaque, the second diagonal branch has mild calcified ostial plaque. LCX artery has diffuse moderate predominantly calcified plaque in its mid portion with associated stenosis 50-69%. There is a focal spot at the distal LCX artery suspicious of stenosis close to 70%. PLA and PDA have minimal plaque. RCA is a very small non-dominant artery with no obvious plaque. IMPRESSION: 1. Coronary calcium score of 980. This was 55 percentile for age and sex matched control. 2. Normal coronary origin with left dominance. 3. Diffuse moderate plaque in the LAD and LCX arteries. An aggressive medical therapy is recommended. Ena Dawley Electronically Signed By: Ena Dawley On: 10/10/2015 10:55  10/10/2015 EXAM: OVER-READ INTERPRETATION CT CHEST The following report is an over-read performed by radiologist Dr. Rebekah [facility] on 10/10/2015. This over-read does not include interpretation of cardiac or coronary  anatomy or pathology. The coronary calcium score/coronary CTA interpretation by the cardiologist is attached. COMPARISON: No priors. FINDINGS: In the medial aspect of the left lower lobe there is a 12 x 15 mm area of nodular architectural distortion (image 56 of series 304). This is somewhat similar in retrospect to remote prior study 06/09/2007, although slightly more bulky. 6 mm left upper lobe pulmonary nodule (image 29 of series 304). 3 mm left upper lobe nodule (image 39 of series 304). 11 x 7 mm nodule in the inferior aspect of  the right upper lobe abutting the minor fissure (image 24 of series 304). No acute consolidative airspace disease. Trace bilateral pleural effusions lying dependently. Areas of ground-glass attenuation and mild interlobular septal thickening are noted throughout the lungs bilaterally, suggesting a background of mild interstitial pulmonary edema. No definite lymphadenopathy within the visualized thorax. Visualized portions of the upper abdomen are remarkable for atherosclerosis. There are no aggressive appearing lytic or blastic lesions noted in the visualized portions of the skeleton. IMPRESSION: 1. Multiple pulmonary nodules scattered throughout the lungs bilaterally. Although the largest of these has been present on prior study dating back to 2008, it does appear slightly more bulky. This largest nodule is favored to represent an area of scarring, however, there are several other nodules that warrant attention on followup studies, and further evaluation with PET-CT is suggested at this time. 2. Evidence of mild interstitial pulmonary edema. 3. Trace bilateral pleural effusions bilaterally. These results will be called to the ordering clinician or representative by the Radiologist Assistant, and communication documented in the PACS or zVision Dashboard. Electronically Signed: By: Vinnie Langton M.D. On: 10/10/2015 09:57     Assessment/Plan: Diagnosis: Right parietal and right occipital infarcts Labs and images independently reviewed. Records reviewed and summated above. Stroke: Continue secondary stroke prophylaxis and Risk Factor Modification listed below:  Antiplatelet therapy:  Blood Pressure Management: Continue current medication with prn's with permisive HTN per primary team Statin Agent:  Pre-Diabetes management:   1. Does the need for close, 24 hr/day medical supervision in concert with the patient's rehab needs make it unreasonable for this patient to be served in a less  intensive setting? Potentially  2. Co-Morbidities requiring supervision/potential complications: HTN (monitor and provide prns in accordance with increased physical exertion and pain), hyperlipidemia (Cont meds), coronary artery disease (Cont meds), PAF (Cont meds, monitor HR with increased physical activity), Tachycardia (monitor in accordance with pain and increasing activity), ABLA (transfuse if necessary to ensure appropriate perfusion for increased activity tolerance), BPH (cont meds), Prediabetes (Monitor in accordance with exercise and adjust meds as necessary) 3. Due to bladder management, safety, disease management, medication administration and patient education, does the patient require 24 hr/day rehab nursing? Yes 4. Does the patient require coordinated care of a physician, rehab nurse, PT (1-2 hrs/day, 5 days/week) and OT (1-2 hrs/day, 5 days/week) to address physical and functional deficits in the context of the above medical diagnosis(es)? Yes Addressing deficits in the following areas: balance, endurance, locomotion, toileting and psychosocial support 5. Can the patient actively participate in an intensive therapy program of at least 3 hrs of therapy per day at least 5 days per week? Yes 6. The potential for patient to make measurable gains while on inpatient rehab is excellent 7. Anticipated functional outcomes upon discharge from inpatient rehab are modified independent with PT, modified independent with OT, n/a with SLP. 8. Estimated rehab length of stay to reach the above functional goals is: 12-15 days. 9. Does the  patient have adequate social supports and living environment to accommodate these discharge functional goals? Yes 10. Anticipated D/C setting: Home 11. Anticipated post D/C treatments: HH therapy and Home excercise program 12. Overall Rehab/Functional Prognosis: excellent  RECOMMENDATIONS: This patient's condition is appropriate for continued rehabilitative care in  the following setting: CIR once medically stable (improvement in HR and BP) Patient has agreed to participate in recommended program. Yes Note that insurance prior authorization may be required for reimbursement for recommended care.  Comment: Rehab Admissions Coordinator to follow up.  Delice Lesch, MD 10/11/2015       Revision History     Date/Time User Provider Type Action   10/11/2015 2:16 PM Ankit Lorie Phenix, MD Physician Sign   10/11/2015 12:22 PM Cathlyn Parsons, PA-C Physician Assistant Pend   View Details Report       Routing History     Date/Time From To Method   10/11/2015 2:16 PM Ankit Lorie Phenix, MD Ankit Lorie Phenix, MD In Basket   10/11/2015 2:16 PM Ankit Lorie Phenix, MD Biagio Borg, MD In [facility]

## 2015-10-14 NOTE — Progress Notes (Signed)
SUBJECTIVE:  No complaints   . amLODipine  10 mg Oral Daily  . apixaban  5 mg Oral BID  . aspirin  81 mg Oral Daily  . atorvastatin  80 mg Oral Daily  . carvedilol  3.125 mg Oral BID WC  . docusate sodium  100 mg Oral Daily  . ezetimibe  10 mg Oral Daily  . furosemide  20 mg Oral Daily  . pantoprazole  40 mg Oral Daily  . sodium chloride flush  3 mL Intravenous Q12H  . tamsulosin  0.4 mg Oral QPC supper  . THROMBI-PAD  1 each Topical Once   OBJECTIVE:   Vitals:   Filed Vitals:   10/14/15 0800 10/14/15 0900 10/14/15 1349 10/14/15 1351  BP: 181/83 164/90 157/71   Pulse: 81 88 78   Temp:    97.4 F (36.3 C)  TempSrc:    Oral  Resp: 17 25 21    Height:      Weight:      SpO2: 98% 98% 99%    I&O's:    Intake/Output Summary (Last 24 hours) at 10/14/15 1557 Last data filed at 10/14/15 1352  Gross per 24 hour  Intake 2219.67 ml  Output   3550 ml  Net -1330.33 ml   TELEMETRY: Reviewed telemetry pt in NSR with bursts of SVT  PHYSICAL EXAM General: Well developed, well nourished, in no acute distress Head: Eyes PERRLA, No xanthomas.   Normal cephalic and atramatic  Lungs:   Clear bilaterally to auscultation and percussion. Heart:   HRRR S1 S2 Pulses are 2+ & equal. Abdomen: Bowel sounds are positive, abdomen soft and non-tender without masses  Extremities:   No clubbing, cyanosis or edema.  DP +1 Neuro: Alert and oriented X 3. Psych:  Good affect, responds appropriately  LABS: Basic Metabolic Panel:  Recent Labs  10/12/15 0402 10/14/15 0808  NA 141  --   K 3.8 3.5  CL 106  --   CO2 27  --   GLUCOSE 101*  --   BUN 14  --   CREATININE 0.97  --   CALCIUM 8.5*  --   MG  --  1.7   Liver Function Tests:  Recent Labs  10/12/15 0402  AST 23  ALT 13*  ALKPHOS 42  BILITOT 0.4  PROT 6.9  ALBUMIN 3.0*   No results for input(s): LIPASE, AMYLASE in the last 72 hours. CBC:  Recent Labs  10/12/15 0402  WBC 7.7  HGB 9.6*  HCT 31.7*  MCV 68.6*  PLT 171     Coag Panel:   Lab Results  Component Value Date   INR 1.13 10/06/2015   INR 1.16 06/19/2015   INR 1.04 11/19/2011   Ct Coronary Morp W/cta Cor W/score W/ca W/cm &/or Wo/cm  10/10/2015  ADDENDUM REPORT: 10/10/2015 10:55  IMPRESSION: 1. Coronary calcium score of 980. This was 59 percentile for age and sex matched control. 2. Normal coronary origin with left dominance. 3. Diffuse moderate plaque in the LAD and LCX arteries. An aggressive medical therapy is recommended. Ena Dawley Electronically Signed   By: Ena Dawley   On: 10/10/2015 10:55    ASSESSMENT/PLAN:   1. 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 S/P right CEA POD #3.   2. 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 Coronary calcium score 980 (81% for age) with diffuse moderate plaque in  LAD and LCX with small non dominant RCA with no plaque. Needs aggressive risk factor modification. HbgA1C mildly elevated so needs followup with PCP as outpt. LDL markedly elevated despite high dose statin. Zetia added this admission. Needs aggressive BP management. Restart Imdur when and ifok with surgery and Neuro. Continue ASA/statin/BB. Need to d/c Cialis since he is on long acting nitrates at home.   3. HTN - improved but remains elevated. Continue amlodipine and coreg and consider restarting home dose of ACE I. Allowing permissible hypertension.  4. Dyslipidemia - LDL poorly controlled at 206 despite high dose statin. ? Whether he has been taking his statin at home. Zetia 10mg  daily added. Will get FLP in 8 weeks and if still elevated then refer to lipid clinic for consideration of [facility] 9 drug.   5. PAF - he was not on longterm anticoagulation at home for unclear reasons. This patients CHA2DS2-VASc Score and unadjusted Ischemic Stroke Rate (% per year) is equal to 9.8 % stroke  rate/year from a score of 6. Above score calculated as 1 point each if present [CHF, HTN, DM, Vascular=MI/PAD/Aortic Plaque, Age if 65-74, or Male]. Above score calculated as 2 points each if present [Age > 75, or Stroke/TIA/TE]. Now on Eliquis. Now having short runs of SVT, a-fib but asymptomatic. Will increase BB. Recommed increasing Coreg to 12.5 mg po BID to suppress bursts of SVT (If OK with Neuro since trying to maintain higher BP). Neuro wants BP running on the high side so may need to stop amlodipine if more beta blockade needed.  6. Acute ischemic CVA - MRI brain shows patchy right frontal, parietal and occipital infarcts. CT angiogram of the head and neck showed high-grade right ICA stenosis with bulky calcified plaque. S/P right CEA. Yesterday patient became confused and had a near-syncopal event while in the bathroom. He was found to have slurred speech. Code stroke was called. Head CT done was unremarkable. MRI of the brain showed evolving small acute infarcts without right cerebral hemisphere with likely watershed ischemia. Neuro stated that this can be seen following CEA and appears this could be a vasovagal or orthostatic event. No new neurological deficit on exam.    Dorothy Spark, MD  10/14/2015  3:57 PM

## 2015-10-14 NOTE — PMR Pre-admission (Signed)
PMR Admission Coordinator Pre-Admission Assessment  Patient: [name] is an 75 y.o., male MRN: [id number] DOB: March 27, 1941 Height: 5' 6" (167.6 cm) Weight: 78.291 kg (172 lb 9.6 oz)              Insurance Information HMO: Yes   PPO:       PCP:       IPA:       80/20:       OTHER:  Group # [id number] PRIMARY: [employer] Medicare      Policy#: [id number]      Subscriber: Merrilee Seashore CM Name: Sherlynn Stalls      Phone#: [phone]     Fax#:  Epic access Pre-Cert#: [id number]      Employer:  Retired Benefits:  Phone #: [phone]     Name:  Vincent Gros. Date: 08/04/15     Deduct:  $0      Out of Pocket Max: [phone] (met $0)      Life Max: unlimited CIR: $430 days 1-4      SNF: $0 days 1-20; $160 days 21-62; $0 days 63-100 Outpatient: Medical necessity     Co-Pay: $40 copay Home Health: 100%      Co-Pay: none DME: 80%     Co-Pay: 20% Providers: in Therapist, art Information    Name Relation Home Work Mobile   [place] [phone]       Current Medical History  Patient Admitting Diagnosis:  R parietal/occipital infarcts  History of Present Illness: A 75 y.o. right handed male with history of hypertension, hyperlipidemia, coronary artery disease maintained on aspirin 81 mg daily, PAF. Patient lives alone and independent prior to admission and driving but he does have a significant other that helps him when needed but cannot provide 24-hour assistance. One level home with 3 steps to entry. He has a brother in [place] but again cannot provide 24-hour care. Presented 10/07/2015 with left-sided weakness. Blood pressure 220/100. Troponin 0.02. MRI of the brain showed patchy acute ischemic nonhemorrhagic infarct involving the cortical gray matter and deep white matter of the right frontal parietal and occipital lobes. Echocardiogram with ejection fraction of 60%. Normal systolic function. Patient did not receive TPA. CTA of head and neck showed stenosis at the  right ICA origin related to low density plaque or thrombus. Superimposed severe intracranial atherosclerotic type changes including high-grade distal right ICA stenosis. Cerebral angiogram showed preocclusive stenosis right ICA proximal. 80% percent stenosis right ICA supraclinoid segment 50% cavernous segment. Vascular surgery consult that underwent right carotid endarterectomy 10/10/2015 per Dr. Trula Slade. Neurology consulted And maintained on Eliquis and low-dose aspirin for CVA prophylaxis. Tolerating a regular consistency diet. Physical and occupational therapy evaluation completed 10/11/2015 with recommendations of physical medicine and rehabilitation consult.Patient to be admitted for comprehensive inpatient rehabilitation program.    Total: 0=NIH  Past Medical History  Past Medical History  Diagnosis Date  . Hypertension     echocardiogram 6/10: Moderate LVH, EF 55-65%, mild, mild MR, MAC, mild LAE, PASP 32  . Dyslipidemia   . History of stroke   . CAD (coronary artery disease)     cath in 1/08: EF 60%, mild dilated Ao root, oD2 30%, LAD 20-30%.  . Paroxysmal atrial fibrillation ([place])   . Anemia, iron deficiency 08/15/2011  . History of pneumonia 08/15/2011  . History of MI (myocardial infarction) 08/15/2011  . Dilated aortic root ([place]) 08/15/2011  . Stroke ([place]) 10/2011  . Myocardial infarction ([place])   .  Hyperlipidemia   . Shortness of breath dyspnea   . Prediabetes 10/08/2015  . Preoperative cardiovascular examination 10/09/2015    Family History  family history includes Aneurysm in his sister. There is no history of Colon cancer.  Prior Rehab/Hospitalizations: No previous rehab admissions.  Old CVA 3-4 years ago with no residual deficits.  Has the patient had major surgery during 100 days prior to admission? No  Current Medications   Current facility-administered medications:  .  0.9 %  sodium chloride infusion, 250 mL, Intravenous, PRN, [name], MD .  0.9 %  sodium  chloride infusion, 500 mL, Intravenous, Once PRN, Samantha J Rhyne, PA-C .  0.9 %  sodium chloride infusion, , Intravenous, Continuous, Samantha J Rhyne, PA-C, Stopped at 10/11/15 0700 .  0.9 %  sodium chloride infusion, , Intravenous, Continuous, Wallie Char, Last Rate: 100 mL/hr at 10/14/15 0400 .  acetaminophen (TYLENOL) tablet 325-650 mg, 325-650 mg, Oral, Q4H PRN **OR** acetaminophen (TYLENOL) suppository 325-650 mg, 325-650 mg, Rectal, Q4H PRN, Samantha J Rhyne, PA-C .  albuterol (PROVENTIL) (2.5 MG/3ML) 0.083% nebulizer solution 2.5 mg, 2.5 mg, Inhalation, Q6H PRN, [name], MD .  alum & mag hydroxide-simeth (MAALOX/MYLANTA) 200-200-20 MG/5ML suspension 15-30 mL, 15-30 mL, Oral, Q2H PRN, Samantha J Rhyne, PA-C .  amLODipine (NORVASC) tablet 10 mg, 10 mg, Oral, Daily, Nishant Dhungel, MD, 10 mg at 10/14/15 0932 .  apixaban (ELIQUIS) tablet 5 mg, 5 mg, Oral, BID, Kimberly B Hammons, RPH, 5 mg at 10/14/15 0933 .  aspirin chewable tablet 81 mg, 81 mg, Oral, Daily, Nishant Dhungel, MD, 81 mg at 10/14/15 0933 .  atorvastatin (LIPITOR) tablet 80 mg, 80 mg, Oral, Daily, [name], MD, 80 mg at 10/14/15 0934 .  bisacodyl (DULCOLAX) suppository 10 mg, 10 mg, Rectal, Daily PRN, Samantha J Rhyne, PA-C .  carvedilol (COREG) tablet 3.125 mg, 3.125 mg, Oral, BID WC, Nishant Dhungel, MD, 3.125 mg at 10/14/15 0800 .  docusate sodium (COLACE) capsule 100 mg, 100 mg, Oral, Daily, Samantha J Rhyne, PA-C, 100 mg at 10/14/15 0939 .  ezetimibe (ZETIA) tablet 10 mg, 10 mg, Oral, Daily, Sueanne Margarita, MD, 10 mg at 10/14/15 0939 .  furosemide (LASIX) tablet 20 mg, 20 mg, Oral, Daily, [name], MD, 20 mg at 10/14/15 0940 .  guaiFENesin-dextromethorphan (ROBITUSSIN DM) 100-10 MG/5ML syrup 15 mL, 15 mL, Oral, Q4H PRN, Samantha J Rhyne, PA-C .  labetalol (NORMODYNE,TRANDATE) injection 5 mg, 5 mg, Intravenous, Q10 min PRN, [name], MD, 5 mg at 10/11/15 1322 .  lactated ringers infusion, ,  Intravenous, Continuous, Nolon Nations, MD, Last Rate: 10 mL/hr at 10/10/15 1203 .  magnesium hydroxide (MILK OF MAGNESIA) suspension 30 mL, 30 mL, Oral, Daily PRN, Samantha J Rhyne, PA-C .  metoprolol (LOPRESSOR) injection 2-5 mg, 2-5 mg, Intravenous, Q2H PRN, Samantha J Rhyne, PA-C .  morphine 2 MG/ML injection 2-3 mg, 2-3 mg, Intravenous, Q2H PRN, Samantha J Rhyne, PA-C .  ondansetron (ZOFRAN) injection 4 mg, 4 mg, Intravenous, Q6H PRN, Samantha J Rhyne, PA-C, 4 mg at 10/12/15 0756 .  oxyCODONE (Oxy IR/ROXICODONE) immediate release tablet 5-10 mg, 5-10 mg, Oral, Q6H PRN, Samantha J Rhyne, PA-C .  pantoprazole (PROTONIX) EC tablet 40 mg, 40 mg, Oral, Daily, Samantha J Rhyne, PA-C, 40 mg at 10/14/15 0942 .  phenol (CHLORASEPTIC) mouth spray 1 spray, 1 spray, Mouth/Throat, PRN, Samantha J Rhyne, PA-C .  senna-docusate (Senokot-S) tablet 1 tablet, 1 tablet, Oral, QHS PRN, [name], MD .  sodium chloride flush (NS) 0.9 % injection 3 mL, 3 mL, Intravenous, Q12H, [name], MD, 3 mL at 10/14/15 1058 .  sodium chloride flush (NS) 0.9 % injection 3 mL, 3 mL, Intravenous, PRN, [name], MD, 3 mL at 10/12/15 2155 .  tamsulosin (FLOMAX) capsule 0.4 mg, 0.4 mg, Oral, QPC supper, Nishant Dhungel, MD, 0.4 mg at 10/13/15 1717 .  THROMBI-PAD (THROMBI-PAD) 3"X3" pad 1 each, 1 each, Topical, Once, Nishant Dhungel, MD  Patients Current Diet: Diet Heart Room service appropriate?: Yes; Fluid consistency:: Thin  Precautions / Restrictions Precautions Precautions: Fall Restrictions Weight Bearing Restrictions: No Other Position/Activity Restrictions: Pt states it is uncomfortable to turn head to the right, RN notified   Has the patient had 2 or more falls or a fall with injury in the past year?No  Prior Activity Level Community (5-7x/wk): Went out daily.  Was driving.  Home Assistive Devices / Equipment Home Assistive Devices/Equipment: Environmental consultant (specify type), Eyeglasses, Cane (specify quad  or straight) (reading glasses) Home Equipment: None  Prior Device Use: Indicate devices/aids used by the patient prior to current illness, exacerbation or injury? None  Prior Functional Level Prior Function Level of Independence: Independent Comments: Pt drives, cooks, cleans etc.  Self Care: Did the patient need help bathing, dressing, using the toilet or eating?  Independent  Indoor Mobility: Did the patient need assistance with walking from room to room (with or without device)? Independent  Stairs: Did the patient need assistance with internal or external stairs (with or without device)? Independent  Functional Cognition: Did the patient need help planning regular tasks such as shopping or remembering to take medications? Independent  Current Functional Level Cognition  Arousal/Alertness: Awake/alert Overall Cognitive Status: Within Functional Limits for tasks assessed (No deficits noted during ADL tasks ) Orientation Level: Oriented X4 General Comments: Overall, pt performed well with basic safety questions etc. Pt just appears a little "off" when answering questions.  Pt had difficulty answering problem solving questions directly but eventually would come to correct answer with cues. Attention: Sustained Sustained Attention: Appears intact Memory: Impaired Memory Impairment: Decreased recall of new information Awareness: Impaired Awareness Impairment: Emergent impairment Problem Solving: Impaired Problem Solving Impairment: Functional basic Safety/Judgment: Appears intact    Extremity Assessment (includes Sensation/Coordination)  Upper Extremity Assessment: Defer to OT evaluation LUE Deficits / Details: LUE functional but not as strong as RUE.  4/5 strength throughout. LUE Sensation: decreased light touch LUE Coordination: decreased fine motor  Lower Extremity Assessment: LLE deficits/detail LLE Deficits / Details: DF, knee extension: 4/5; hip flexion, knee flexion:  4-/5 LLE Sensation: decreased light touch LLE Coordination: decreased gross motor    ADLs  Overall ADL's : Needs assistance/impaired Eating/Feeding: Set up, Sitting Eating/Feeding Details (indicate cue type and reason): needed assist opening small packages. Grooming: Wash/dry hands, Wash/dry face, Oral care, Brushing hair, Sitting, Set up, Standing, Minimal assistance Grooming Details (indicate cue type and reason): Min a for balance in standing.  set up assist sitting  Upper Body Bathing: Set up, Supervision/ safety, Sitting Upper Body Bathing Details (indicate cue type and reason): cues to stay on task. Lower Body Bathing: Moderate assistance, Sit to/from stand Lower Body Bathing Details (indicate cue type and reason): Pt with posterior LOB requiring mod A to recover  Upper Body Dressing : Minimal assistance Upper Body Dressing Details (indicate cue type and reason): due to lines  Lower Body Dressing: Moderate assistance, Sit to/from stand Lower Body Dressing Details (indicate cue type and reason): Pt with  LOB.  required mod A to recover  Toilet Transfer: Minimal assistance, Ambulation, Comfort height toilet, RW Toilet Transfer Details (indicate cue type and reason): walked to toilet Toileting- Clothing Manipulation and Hygiene: Minimal assistance, Sit to/from stand Functional mobility during ADLs: Minimal assistance, Moderate assistance General ADL Comments: Pt requires assist for balance     Mobility  Overal bed mobility: Needs Assistance Bed Mobility: Supine to Sit Supine to sit: Supervision General bed mobility comments: Use of bed rail and increased time. Cues to scoot to EOB for improved balance w/ feet on floor.    Transfers  Overall transfer level: Needs assistance Equipment used: 1 person hand held assist Transfers: Sit to/from Stand, Stand Pivot Transfers Sit to Stand: Min assist Stand pivot transfers: Min assist General transfer comment: Pt requires min assist to  steady due to weakness and ataxia in Lt LE.  Cues to stand upright.    Ambulation / Gait / Stairs / Wheelchair Mobility  Ambulation/Gait Ambulation/Gait assistance: Museum/gallery curator (Feet): 100 Feet Assistive device: 1 person hand held assist Gait Pattern/deviations: Ataxic, Trunk flexed, Staggering left, Staggering right General Gait Details: Lt LE ataxia and pt reached out for armrail and bed in hallway to steady.  Min assist to steady as pt staggers Lt and Rt.  Cues for upright posture as pt looking down at feet. Gait velocity interpretation: Below normal speed for age/gender    Posture / Balance Balance Overall balance assessment: Needs assistance Sitting-balance support: Feet supported Sitting balance-Leahy Scale: Good Standing balance support: During functional activity Standing balance-Leahy Scale: Poor Standing balance comment: Pt with LOB during ADLs, required mod A to recover and prevent fall     Special needs/care consideration BiPAP/CPAP No CPM No Continuous Drip IV No Dialysis No        Life Vest No Oxygen No Special Bed No Trach Size No Wound Vac (area) no       Skin No                             Bowel mgmt: Last BM 10/11/15 Bladder mgmt: Urinary catheter Diabetic mgmt No    Previous Home Environment Living Arrangements: Other relatives (grandson) Available Help at Discharge: Friend(s), Family (may be able to stay with brother if needed?) Type of Home: House Home Layout: One level Home Access: Stairs to enter Entrance Stairs-Rails: Left Entrance Stairs-Number of Steps: 4 Bathroom Shower/Tub: Tub/shower unit, Architectural technologist: Standard Home Care Services: No Additional Comments: Might stay at brother's house at d/c, he lives in [place].  Family visiting pt today and pt will begin converasation with family about assist avaialble at d/c and where he might go.  Discharge Living Setting Plans for Discharge Living Setting: Patient's  home, Alone, House (Lives alone) Type of Home at Discharge: House Discharge Home Layout: One level Discharge Home Access: Stairs to enter Entrance Stairs-Number of Steps: 5-7 steps back entry and 3-4 steps at front entry Does the patient have any problems obtaining your medications?: No  Social/Family/Support Systems Patient Roles: Other (Comment) (Has a long time friend.) Contact Information: Dayna Barker - friend [phone] Anticipated Caregiver: self Ability/Limitations of Caregiver: Has friends who may be able to stop by to assist at times Caregiver Availability: Intermittent Discharge Plan Discussed with Primary Caregiver: Yes Is Caregiver In Agreement with Plan?: Yes Does Caregiver/Family have Issues with Lodging/Transportation while Pt is in Rehab?: No  Goals/Additional Needs Patient/Family Goal for Rehab: PT/OT mod  I goals Expected length of stay: 12-15 days Cultural Considerations: 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, attends church on Sundays Dietary Needs: Heart healthy, thin liquids Equipment Needs: TBD Pt/Family Agrees to Admission and willing to participate: Yes Program Orientation Provided & Reviewed with Pt/Caregiver Including Roles  & Responsibilities: Yes  Decrease burden of Care through IP rehab admission: N/A  Possible need for SNF placement upon discharge: Not anticipated  Patient Condition: This patient's medical and functional status has changed since the consult dated: 10/11/15 in which the Rehabilitation Physician determined and documented that the patient's condition is appropriate for intensive rehabilitative care in an inpatient rehabilitation facility. See "History of Present Illness" (above) for medical update. Functional changes are:  Currently requiring min/mod assist for mobility and ADLs. Patient's medical and functional status update has been discussed with the Rehabilitation physician and patient remains appropriate for inpatient rehabilitation.  Will admit to inpatient rehab today.  Preadmission Screen Completed By:  Retta Diones, 10/14/2015 3:51 PM ______________________________________________________________________   Discussed status with Dr. Posey Pronto on 10/14/15 at 1551 and received telephone approval for admission today.  Admission Coordinator:  Retta Diones, time1551/Date03/13/17

## 2015-10-14 NOTE — Progress Notes (Signed)
Pt to [place] to CIR room 6, VSS, family present and aware of [place]

## 2015-10-14 NOTE — Progress Notes (Signed)
  [facility] Progress Note  Subjective  - POD #4  No complaints.   Objective Filed Vitals:   10/14/15 0517 10/14/15 0625  BP: 163/79 174/85  Pulse: 85 75  Temp:    Resp: 16 20    Intake/Output Summary (Last 24 hours) at 10/14/15 0812 Last data filed at 10/14/15 0413  Gross per 24 hour  Intake   3603 ml  Output   1850 ml  Net   1753 ml    Right neck incision clean without hematoma 5/5 grip strength bilaterally, 5/5 strength lower extremities bilaterally. Tongue midline. Smile symmetric  Assessment/Planning: 75 y.o. male is s/p: right carotid endarterectomy complicated by right hemispheric infarct  Neuro exam intact this morning. No new events.  Incision healing well. [place] for CIR if approved.   Alvia Grove 10/14/2015 8:12 AM --  Laboratory CBC    Component Value Date/Time   WBC 7.7 10/12/2015 0402   HGB 9.6* 10/12/2015 0402   HCT 31.7* 10/12/2015 0402   PLT 171 10/12/2015 0402    BMET    Component Value Date/Time   NA 141 10/12/2015 0402   K 3.8 10/12/2015 0402   CL 106 10/12/2015 0402   CO2 27 10/12/2015 0402   GLUCOSE 101* 10/12/2015 0402   BUN 14 10/12/2015 0402   CREATININE 0.97 10/12/2015 0402   CALCIUM 8.5* 10/12/2015 0402   GFRNONAA >60 10/12/2015 0402   GFRAA >60 10/12/2015 0402    COAG Lab Results  Component Value Date   INR 1.13 10/06/2015   INR 1.16 06/19/2015   INR 1.04 11/19/2011   No results found for: PTT  Antibiotics Anti-infectives    Start     Dose/Rate Route Frequency Ordered Stop   10/11/15 0100  cefUROXime (ZINACEF) 1.5 g in dextrose 5 % 50 mL IVPB     1.5 g 100 mL/hr over 30 Minutes Intravenous Every 12 hours 10/10/15 1834 10/11/15 1327   10/10/15 1000  cefUROXime (ZINACEF) 1.5 g in dextrose 5 % 50 mL IVPB     1.5 g 100 mL/hr over 30 Minutes Intravenous To ShortStay Surgical 10/09/15 1127 10/10/15 [place], PA-C [facility] Office: [phone] Pager:  [phone] 10/14/2015 8:12 AM

## 2015-10-14 NOTE — Telephone Encounter (Signed)
-----   Message from Denman Kalib, RN sent at 10/10/2015  4:14 PM EST ----- Regarding: needs 2 wk. f/u with Dr. Trula Slade   ----- Message -----    From: Gabriel Earing, PA-C    Sent: 10/10/2015   3:49 PM      To: [facility] Charge Pool  S/p right CEA 10/10/15.  F/u with [facility] in 2 weeks.  Thanks, Aldona Bar

## 2015-10-14 NOTE — Progress Notes (Signed)
PT Cancellation Note  Patient Details Name: [name] MRN: [id number] DOB: [birth year]   Cancelled Treatment:    Reason Eval/Treat Not Completed: Other (comment).  Rehab Admission Coordinator meeting w/ pt for d/c to IP Rehab today.    Collie Siad PT, DPT  Pager: [phone] Phone: [phone] 10/14/2015, 3:23 PM

## 2015-10-14 NOTE — Progress Notes (Signed)
Rehab admissions - I have approval from [facility] for acute inpatient rehab admission for today.  I will contact attending MD to see if patient is medically ready for discharge today.  We do have rehab beds available today.  [id number]

## 2015-10-14 NOTE — Progress Notes (Signed)
Rehab admissions - I have approval for acute inpatient rehab admission for today.  Patient is now agreeable to rehab admission.  Bed available and will admit to acute inpatient rehab today.  CK:[id number]

## 2015-10-14 NOTE — Telephone Encounter (Signed)
LM for pt re appt, dpm °

## 2015-10-14 NOTE — H&P (View-Only) (Signed)
Physical Medicine and Rehabilitation Admission H&P    Chief Complaint  Patient presents with  . Numbness  . Abdominal Pain  : HPI: [name] is a 75 y.o. right handed male with history of hypertension, hyperlipidemia, coronary artery disease maintained on aspirin 81 mg daily, PAF. Patient lives alone and independent prior to admission and driving but he does have a significant other that helps him when needed but cannot provide 24-hour assistance. One level home with 3 steps to entry. He has a brother in [place] but again cannot provide 24-hour care. Presented 10/07/2015 with left-sided weakness. Blood pressure 220/100. Troponin 0.02. MRI of the brain showed patchy acute ischemic nonhemorrhagic infarct involving the cortical gray matter and deep white matter of the right frontal parietal and occipital lobes. Echocardiogram with ejection fraction of 60%. Normal systolic function. Patient did not receive TPA. CTA of head and neck showed stenosis at the right ICA origin related to low density plaque or thrombus. Superimposed severe intracranial atherosclerotic type changes including high-grade distal right ICA stenosis. Cerebral angiogram showed preocclusive stenosis right ICA proximal. 80% percent stenosis right ICA supraclinoid segment 50% cavernous segment. Vascular surgery consult that underwent right carotid endarterectomy 10/10/2015 per Dr. Trula Slade. Neurology consulted And maintained on Eliquis and low-dose aspirin for CVA prophylaxis.  Tolerating a regular consistency diet. Physical and  occupational therapy evaluation completed 10/11/2015 with recommendations of physical medicine and rehabilitation consult.Patient was admitted for comprehensive rehabilitation program  ROS Constitutional: Negative for fever and chills.  HENT:   HOH  Eyes: Negative for blurred vision and double vision.  Respiratory: Negative for cough and shortness of breath.  Cardiovascular: Negative for chest  pain and leg swelling.  Gastrointestinal: Positive for constipation. Negative for nausea and vomiting.  Genitourinary: Negative for dysuria.  Musculoskeletal: Positive for myalgias and joint pain.  Skin: Negative for rash.  Neurological: Positive for weakness. Negative for seizures, loss of consciousness and headaches.  Psychiatric/Behavioral: Positive for memory loss.  All other systems reviewed and are negative  Past Medical History  Diagnosis Date  . Hypertension     echocardiogram 6/10: Moderate LVH, EF 55-65%, mild, mild MR, MAC, mild LAE, PASP 32  . Dyslipidemia   . History of stroke   . CAD (coronary artery disease)     cath in 1/08: EF 60%, mild dilated Ao root, oD2 30%, LAD 20-30%.  . Paroxysmal atrial fibrillation ([place])   . Anemia, iron deficiency 08/15/2011  . History of pneumonia 08/15/2011  . History of MI (myocardial infarction) 08/15/2011  . Dilated aortic root ([place]) 08/15/2011  . Stroke ([place]) 10/2011  . Myocardial infarction ([place])   . Hyperlipidemia   . Shortness of breath dyspnea   . Prediabetes 10/08/2015  . Preoperative cardiovascular examination 10/09/2015   Past Surgical History  Procedure Laterality Date  . Cardiac catheterization    . Endarterectomy Right 10/10/2015    Procedure: ENDARTERECTOMY CAROTID-RIGHT;  Surgeon: Serafina Mitchell, MD;  Location: [facility] OR;  Service: Vascular;  Laterality: Right;   Family History  Problem Relation Age of Onset  . Colon cancer Neg Hx   . Aneurysm Sister    Social History:  reports that he has never smoked. He has never used smokeless tobacco. He reports that he does not drink alcohol or use illicit drugs. Allergies: No Known Allergies Medications Prior to Admission  Medication Sig Dispense Refill  . albuterol (PROVENTIL HFA;VENTOLIN HFA) 108 (90 BASE) MCG/ACT inhaler Inhale 2 puffs into the lungs every 6 (six)  hours as needed for wheezing or shortness of breath. (Patient not taking: Reported on 10/07/2015) 1 Inhaler 2  .  amitriptyline (ELAVIL) 50 MG tablet Take 1 tablet (50 mg total) by mouth at bedtime as needed for sleep. (Patient not taking: Reported on 01/21/2015) 90 tablet 1  . amLODipine (NORVASC) 10 MG tablet Take 1 tablet (10 mg total) by mouth daily. (Patient not taking: Reported on 06/19/2015) 30 tablet 0  . aspirin EC 81 MG tablet Take 1 tablet (81 mg total) by mouth daily. (Patient not taking: Reported on 10/07/2015) 30 tablet 0  . atorvastatin (LIPITOR) 80 MG tablet Take 1 tablet (80 mg total) by mouth daily. (Patient not taking: Reported on 01/21/2015) 90 tablet 3  . carvedilol (COREG) 3.125 MG tablet Take 1 tablet (3.125 mg total) by mouth 2 (two) times daily with a meal. (Patient not taking: Reported on 10/07/2015) 60 tablet 0  . furosemide (LASIX) 20 MG tablet Take 1 tablet (20 mg total) by mouth daily. (Patient not taking: Reported on 10/07/2015) 30 tablet 0  . isosorbide mononitrate (IMDUR) 30 MG 24 hr tablet Take 1 tablet (30 mg total) by mouth daily. (Patient not taking: Reported on 10/07/2015) 30 tablet 0  . lisinopril (PRINIVIL,ZESTRIL) 5 MG tablet Take 1 tablet (5 mg total) by mouth daily. (Patient not taking: Reported on 10/07/2015) 30 tablet 0  . tadalafil (CIALIS) 20 MG tablet Take 1 tablet (20 mg total) by mouth daily as needed for erectile dysfunction. (Patient not taking: Reported on 01/21/2015) 5 tablet 11    Home: Home Living Family/patient expects to be discharged to:: Private residence Living Arrangements: Other relatives (grandson) Available Help at Discharge: Friend(s), Family (may be able to stay with brother if needed?) Type of Home: House Home Access: Stairs to enter Technical brewer of Steps: 4 Entrance Stairs-Rails: Left Home Layout: One level Bathroom Shower/Tub: Tub/shower unit, Architectural technologist: Standard Home Equipment: None Additional Comments: Might stay at brother's house at d/c, he lives in [place].  Family visiting pt today and pt will begin converasation  with family about assist avaialble at d/c and where he might go.   Functional History: Prior Function Level of Independence: Independent Comments: Pt drives, cooks, cleans etc.  Functional Status:  Mobility: Bed Mobility Overal bed mobility: Needs Assistance Bed Mobility: Supine to Sit Supine to sit: Min guard, HOB elevated General bed mobility comments: Use of bed rail and increased time. Cues to scoot to EOB for improved balance w/ feet on floor. Transfers Overall transfer level: Needs assistance Equipment used: 1 person hand held assist Transfers: Sit to/from Stand Sit to Stand: Min assist Stand pivot transfers: Min assist General transfer comment: Pt requires min assist to steady due to weakness and ataxia in Lt LE.  Cues to stand upright. Ambulation/Gait Ambulation/Gait assistance: Min assist Ambulation Distance (Feet): 100 Feet Assistive device: 1 person hand held assist Gait Pattern/deviations: Ataxic, Trunk flexed, Staggering left, Staggering right General Gait Details: Lt LE ataxia and pt reached out for armrail and bed in hallway to steady.  Min assist to steady as pt staggers Lt and Rt.  Cues for upright posture as pt looking down at feet. Gait velocity interpretation: Below normal speed for age/gender    ADL: ADL Overall ADL's : Needs assistance/impaired Eating/Feeding: Set up, Sitting Eating/Feeding Details (indicate cue type and reason): needed assist opening small packages. Grooming: Wash/dry hands, Wash/dry face, Oral care, Minimal assistance, Sitting Grooming Details (indicate cue type and reason): min assist at times for small  fine motor tasks but with cues could perform on own. Upper Body Bathing: Minimal assitance, Cueing for sequencing, Sitting Upper Body Bathing Details (indicate cue type and reason): cues to stay on task. Lower Body Bathing: Minimal assistance, Sit to/from stand, Cueing for sequencing Lower Body Bathing Details (indicate cue type and  reason): min assist when on his feet due to weakness in LLE.  Pt not fully steady on his feet in standing. Upper Body Dressing : Set up, Sitting Upper Body Dressing Details (indicate cue type and reason): assist with buttons and fastners. Lower Body Dressing: Minimal assistance, Sit to/from stand, Cueing for sequencing Lower Body Dressing Details (indicate cue type and reason): Min assist to stand and fasten pants and keep balance. Toilet Transfer: Minimal assistance, Ambulation, Grab bars, Regular Toilet Toilet Transfer Details (indicate cue type and reason): walked to toilet Toileting- Clothing Manipulation and Hygiene: Supervision/safety, Sitting/lateral lean Functional mobility during ADLs: Minimal assistance General ADL Comments: Pt requires min assist for most adls due to decreased balance in standing and mild cognitive deficits.    Cognition: Cognition Overall Cognitive Status: Impaired/Different from baseline Arousal/Alertness: Awake/alert Orientation Level: Oriented X4 Attention: Sustained Sustained Attention: Appears intact Memory: Impaired Memory Impairment: Decreased recall of new information Awareness: Impaired Awareness Impairment: Emergent impairment Problem Solving: Impaired Problem Solving Impairment: Functional basic Safety/Judgment: Appears intact Cognition Arousal/Alertness: Awake/alert Behavior During Therapy: WFL for tasks assessed/performed Overall Cognitive Status: Impaired/Different from baseline Area of Impairment: Memory, Problem solving Memory: Decreased short-term memory Problem Solving: Slow processing General Comments: Overall, pt performed well with basic safety questions etc. Pt just appears a little "off" when answering questions.  Pt had difficulty answering problem solving questions directly but eventually would come to correct answer with cues.  Physical Exam: Blood pressure 174/85, pulse 75, temperature 97 F (36.1 C), temperature source  Axillary, resp. rate 20, height 5\' 6"  (1.676 m), weight 78.291 kg (172 lb 9.6 oz), SpO2 92 %. Physical Exam Constitutional: He appears well-developed and well-nourished.  HENT:  Head: Normocephalic and atraumatic.  Eyes: Conjunctivae and EOM are normal.  Neck: Normal range of motion. Neck supple. No thyromegaly present.  Cardiovascular: Normal rate and regular rhythm.  Respiratory: Effort normal and breath sounds normal. No respiratory distress.  GI: Soft. Bowel sounds are normal. He exhibits no distension.  Musculoskeletal: He exhibits no edema or tenderness.  Neurological: He is alert.  Mood is a bit flat but appropriate.  He has made good eye contact with examiner and follows simple commands.  Noted some delay in processing but appropriate for name, age and place.  He does have awareness of his deficits Sensation intact to light touch DTRs symmetric Motor: b/l UE 4+/5, B/l LE 5/5 (right slightly stronger than left)  Mild ataxia LUE>RUE  Skin: Skin is warm and dry.  Psychiatric: He has a normal mood and affect. His behavior is normal   Results for orders placed or performed during the hospital encounter of 10/06/15 (from the past 48 hour(s))  Potassium     Status: None   Collection Time: 10/14/15  8:08 AM  Result Value Ref Range   Potassium 3.5 3.5 - 5.1 mmol/L  Magnesium     Status: None   Collection Time: 10/14/15  8:08 AM  Result Value Ref Range   Magnesium 1.7 1.7 - 2.4 mg/dL   No results found.   Medical Problem List and Plan: 1.  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  DVT Prophylaxis/Anticoagulation: Eliquis 3. Pain Management:  Oxycodone as needed 4. Symptomatic right carotid stenosis. 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 Follow-up vascular surgery as needed 5. Neuropsych: This patient is capable of making decisions on his own behalf. 6. Skin/Wound Care: Routine skin checks 7. Fluids/Electrolytes/Nutrition: Routine I&O  with follow-up chemistries 8. Hypertension/PAF. Norvasc 10 mg daily, Coreg 3.125 mg twice a day, Lasix 20 mg daily. Monitor with increased mobility 9. Coronary artery disease. No chest pain or shortness of breath. Patient currently remains on low dose aspirin 10. Hyperlipidemia. Lipitor/zetia 11. BPH. Flomax 0.4 mg daily. Check PVR 3  Post Admission Physician Evaluation: 1. Functional deficits secondary  to right parietal occipital lobe infarct. 2. Patient is admitted to receive collaborative, interdisciplinary care between the physiatrist, rehab nursing staff, and therapy team. 3. Patient's level of medical complexity and substantial therapy needs in context of that medical necessity cannot be provided at a lesser intensity of care such as a SNF. 4. Patient has experienced substantial functional loss from his/her baseline which was documented above under the "Functional History" and "Functional Status" headings.  Judging by the patient's diagnosis, physical exam, and functional history, the patient has potential for functional progress which will result in measurable gains while on inpatient rehab.  These gains will be of substantial and practical use upon discharge  in facilitating mobility and self-care at the household level. 5. Physiatrist will provide 24 hour management of medical needs as well as oversight of the therapy plan/treatment and provide guidance as appropriate regarding the interaction of the two. 6. 24 hour rehab nursing will assist with bladder management, safety, disease management, medication administration and patient education and help integrate therapy concepts, techniques,education, etc. 7. PT will assess and treat for/with: Lower extremity strength, range of motion, stamina, balance, functional mobility, safety, adaptive techniques and equipment, coping skills, pain control, stroke education.   Goals are: Mod I. 8. OT will assess and treat for/with: ADL's, functional  mobility, safety, upper extremity strength, adaptive techniques and equipment, ego support, and community reintegration.   Goals are: Mod I. Therapy may proceed with showering this patient. 9. Case Management and Social Worker will assess and treat for psychological issues and discharge planning. 10. Team conference will be held weekly to assess progress toward goals and to determine barriers to discharge. 11. Patient will receive at least 3 hours of therapy per day at least 5 days per week. 12. ELOS: 12-15 days.       13. Prognosis:  good  Delice Lesch, MD 10/14/2015

## 2015-10-14 NOTE — Plan of Care (Signed)
Problem: Coping: Goal: Ability to identify strategies to decrease anxiety will improve Outcome: Adequate for Discharge Patient does not express or exhibit feelings of anxiety regarding his strokes.

## 2015-10-14 NOTE — Discharge Instructions (Signed)

## 2015-10-14 NOTE — Progress Notes (Signed)
Retta Diones, RN Rehab Admission Coordinator Signed Physical Medicine and Rehabilitation PMR Pre-admission 10/14/2015 3:39 PM  Related encounter: ED to Hosp-Admission (Current) from 10/06/2015 in [facility] Collapse All   PMR Admission Coordinator Pre-Admission Assessment  Patient: [name] is an 75 y.o., male MRN: [id number] DOB: 06/26/1941 Height: '5\' 6"'  (167.6 cm) Weight: 78.291 kg (172 lb 9.6 oz)  Insurance Information HMO: Yes PPO: PCP: IPA: 80/20: OTHER: Group # [id number] PRIMARY: [employer] Medicare Policy#: [id number] Subscriber: [name] CM Name: [name] Phone#: [phone] Fax#: Epic access Pre-Cert#: [id number] Employer: Retired Benefits: Phone #: [phone] Name: Craig Gros. Date: 08/04/15 Deduct: $0 Out of Pocket Max: [phone] (met $0) Life Max: unlimited CIR: $430 days 1-4 SNF: $0 days 1-20; $160 days 21-62; $0 days 63-100 Outpatient: Medical necessity Co-Pay: $40 copay Home Health: 100% Co-Pay: none DME: 80% Co-Pay: 20% Providers: in Therapist, art Information    Name Relation Home Work Mobile   [place] [phone]       Current Medical History  Patient Admitting Diagnosis: R parietal/occipital infarcts  History of Present Illness: A 75 y.o. right handed male with history of hypertension, hyperlipidemia, coronary artery disease maintained on aspirin 81 mg daily, PAF. Patient lives alone and independent prior to admission and driving but he does have a significant other that helps him when needed but cannot provide 24-hour assistance. One level home with 3 steps to entry. He has a brother in [place] but again cannot provide  24-hour care. Presented 10/07/2015 with left-sided weakness. Blood pressure 220/100. Troponin 0.02. MRI of the brain showed patchy acute ischemic nonhemorrhagic infarct involving the cortical gray matter and deep white matter of the right frontal parietal and occipital lobes. Echocardiogram with ejection fraction of 60%. Normal systolic function. Patient did not receive TPA. CTA of head and neck showed stenosis at the right ICA origin related to low density plaque or thrombus. Superimposed severe intracranial atherosclerotic type changes including high-grade distal right ICA stenosis. Cerebral angiogram showed preocclusive stenosis right ICA proximal. 80% percent stenosis right ICA supraclinoid segment 50% cavernous segment. Vascular surgery consult that underwent right carotid endarterectomy 10/10/2015 per [name]. Neurology consulted And maintained on Eliquis and low-dose aspirin for CVA prophylaxis. Tolerating a regular consistency diet. Physical and occupational therapy evaluation completed 10/11/2015 with recommendations of physical medicine and rehabilitation consult.Patient to be admitted for comprehensive inpatient rehabilitation program.   Total: 0=NIH  Past Medical History  Past Medical History  Diagnosis Date  . Hypertension     echocardiogram 6/10: Moderate LVH, EF 55-65%, mild, mild MR, MAC, mild LAE, PASP 32  . Dyslipidemia   . History of stroke   . CAD (coronary artery disease)     cath in 1/08: EF 60%, mild dilated Ao root, oD2 30%, LAD 20-30%.  . Paroxysmal atrial fibrillation ([place])   . Anemia, iron deficiency 08/15/2011  . History of pneumonia 08/15/2011  . History of MI (myocardial infarction) 08/15/2011  . Dilated aortic root ([place]) 08/15/2011  . Stroke ([place]) 10/2011  . Myocardial infarction ([place])   . Hyperlipidemia   . Shortness of breath dyspnea   . Prediabetes 10/08/2015  . Preoperative cardiovascular examination  10/09/2015    Family History  family history includes Aneurysm in his sister. There is no history of Colon cancer.  Prior Rehab/Hospitalizations: No previous rehab admissions. Old CVA 3-4 years ago with no residual deficits.  Has the patient had major surgery  during 100 days prior to admission? No  Current Medications   Current facility-administered medications:  . 0.9 % sodium chloride infusion, 250 mL, Intravenous, PRN, Craig Qua Opyd, MD . 0.9 % sodium chloride infusion, 500 mL, Intravenous, Once PRN, Craig J Rhyne, PA-C . 0.9 % sodium chloride infusion, , Intravenous, Continuous, Craig J Rhyne, PA-C, Stopped at 10/11/15 0700 . 0.9 % sodium chloride infusion, , Intravenous, Continuous, [name], Last Rate: 100 mL/hr at 10/14/15 0400 . acetaminophen (TYLENOL) tablet 325-650 mg, 325-650 mg, Oral, Q4H PRN **OR** acetaminophen (TYLENOL) suppository 325-650 mg, 325-650 mg, Rectal, Q4H PRN, Craig J Rhyne, PA-C . albuterol (PROVENTIL) (2.5 MG/3ML) 0.083% nebulizer solution 2.5 mg, 2.5 mg, Inhalation, Q6H PRN, Craig Qua Opyd, MD . alum & mag hydroxide-simeth (MAALOX/MYLANTA) 200-200-20 MG/5ML suspension 15-30 mL, 15-30 mL, Oral, Q2H PRN, Craig J Rhyne, PA-C . amLODipine (NORVASC) tablet 10 mg, 10 mg, Oral, Daily, Craig Dhungel, MD, 10 mg at 10/14/15 0932 . apixaban (ELIQUIS) tablet 5 mg, 5 mg, Oral, BID, [name], RPH, 5 mg at 10/14/15 0933 . aspirin chewable tablet 81 mg, 81 mg, Oral, Daily, Craig Dhungel, MD, 81 mg at 10/14/15 0933 . atorvastatin (LIPITOR) tablet 80 mg, 80 mg, Oral, Daily, Craig Bulls, MD, 80 mg at 10/14/15 0934 . bisacodyl (DULCOLAX) suppository 10 mg, 10 mg, Rectal, Daily PRN, Craig J Rhyne, PA-C . carvedilol (COREG) tablet 3.125 mg, 3.125 mg, Oral, BID WC, Craig Dhungel, MD, 3.125 mg at 10/14/15 0800 . docusate sodium (COLACE) capsule 100 mg, 100 mg, Oral, Daily, Craig J Rhyne, PA-C, 100 mg at 10/14/15 0939 .  ezetimibe (ZETIA) tablet 10 mg, 10 mg, Oral, Daily, Craig Margarita, MD, 10 mg at 10/14/15 0939 . furosemide (LASIX) tablet 20 mg, 20 mg, Oral, Daily, Craig Qua Opyd, MD, 20 mg at 10/14/15 0940 . guaiFENesin-dextromethorphan (ROBITUSSIN DM) 100-10 MG/5ML syrup 15 mL, 15 mL, Oral, Q4H PRN, Craig J Rhyne, PA-C . labetalol (NORMODYNE,TRANDATE) injection 5 mg, 5 mg, Intravenous, Q10 min PRN, Craig Bulls, MD, 5 mg at 10/11/15 1322 . lactated ringers infusion, , Intravenous, Continuous, Craig Nations, MD, Last Rate: 10 mL/hr at 10/10/15 1203 . magnesium hydroxide (MILK OF MAGNESIA) suspension 30 mL, 30 mL, Oral, Daily PRN, Craig J Rhyne, PA-C . metoprolol (LOPRESSOR) injection 2-5 mg, 2-5 mg, Intravenous, Q2H PRN, Craig J Rhyne, PA-C . morphine 2 MG/ML injection 2-3 mg, 2-3 mg, Intravenous, Q2H PRN, Craig J Rhyne, PA-C . ondansetron (ZOFRAN) injection 4 mg, 4 mg, Intravenous, Q6H PRN, Craig J Rhyne, PA-C, 4 mg at 10/12/15 0756 . oxyCODONE (Oxy IR/ROXICODONE) immediate release tablet 5-10 mg, 5-10 mg, Oral, Q6H PRN, Craig J Rhyne, PA-C . pantoprazole (PROTONIX) EC tablet 40 mg, 40 mg, Oral, Daily, Craig J Rhyne, PA-C, 40 mg at 10/14/15 0942 . phenol (CHLORASEPTIC) mouth spray 1 spray, 1 spray, Mouth/Throat, PRN, Craig J Rhyne, PA-C . senna-docusate (Senokot-S) tablet 1 tablet, 1 tablet, Oral, QHS PRN, Craig Qua Opyd, MD . sodium chloride flush (NS) 0.9 % injection 3 mL, 3 mL, Intravenous, Q12H, Craig Qua Opyd, MD, 3 mL at 10/14/15 1058 . sodium chloride flush (NS) 0.9 % injection 3 mL, 3 mL, Intravenous, PRN, Craig Bulls, MD, 3 mL at 10/12/15 2155 . tamsulosin (FLOMAX) capsule 0.4 mg, 0.4 mg, Oral, QPC supper, Craig Dhungel, MD, 0.4 mg at 10/13/15 1717 . THROMBI-PAD (THROMBI-PAD) 3"X3" pad 1 each, 1 each, Topical, Once, Craig Dhungel, MD  Patients Current Diet: Diet Heart Room service appropriate?: Yes; Fluid consistency:: Thin  Precautions /  Restrictions Precautions Precautions: Fall Restrictions Weight Bearing Restrictions: No Other Position/Activity Restrictions: Pt states it is uncomfortable to turn head to the right, RN notified   Has the patient had 2 or more falls or a fall with injury in the past year?No  Prior Activity Level Community (5-7x/wk): Went out daily. Was driving.  Home Assistive Devices / Equipment Home Assistive Devices/Equipment: Environmental consultant (specify type), Eyeglasses, Cane (specify quad or straight) (reading glasses) Home Equipment: None  Prior Device Use: Indicate devices/aids used by the patient prior to current illness, exacerbation or injury? None  Prior Functional Level Prior Function Level of Independence: Independent Comments: Pt drives, cooks, cleans etc.  Self Care: Did the patient need help bathing, dressing, using the toilet or eating? Independent  Indoor Mobility: Did the patient need assistance with walking from room to room (with or without device)? Independent  Stairs: Did the patient need assistance with internal or external stairs (with or without device)? Independent  Functional Cognition: Did the patient need help planning regular tasks such as shopping or remembering to take medications? Independent  Current Functional Level Cognition  Arousal/Alertness: Awake/alert Overall Cognitive Status: Within Functional Limits for tasks assessed (No deficits noted during ADL tasks ) Orientation Level: Oriented X4 General Comments: Overall, pt performed well with basic safety questions etc. Pt just appears a little "off" when answering questions. Pt had difficulty answering problem solving questions directly but eventually would come to correct answer with cues. Attention: Sustained Sustained Attention: Appears intact Memory: Impaired Memory Impairment: Decreased recall of new information Awareness: Impaired Awareness Impairment: Emergent impairment Problem Solving:  Impaired Problem Solving Impairment: Functional basic Safety/Judgment: Appears intact   Extremity Assessment (includes Sensation/Coordination)  Upper Extremity Assessment: Defer to OT evaluation LUE Deficits / Details: LUE functional but not as strong as RUE. 4/5 strength throughout. LUE Sensation: decreased light touch LUE Coordination: decreased fine motor  Lower Extremity Assessment: LLE deficits/detail LLE Deficits / Details: DF, knee extension: 4/5; hip flexion, knee flexion: 4-/5 LLE Sensation: decreased light touch LLE Coordination: decreased gross motor    ADLs  Overall ADL's : Needs assistance/impaired Eating/Feeding: Set up, Sitting Eating/Feeding Details (indicate cue type and reason): needed assist opening small packages. Grooming: Wash/dry hands, Wash/dry face, Oral care, Brushing hair, Sitting, Set up, Standing, Minimal assistance Grooming Details (indicate cue type and reason): Min a for balance in standing. set up assist sitting  Upper Body Bathing: Set up, Supervision/ safety, Sitting Upper Body Bathing Details (indicate cue type and reason): cues to stay on task. Lower Body Bathing: Moderate assistance, Sit to/from stand Lower Body Bathing Details (indicate cue type and reason): Pt with posterior LOB requiring mod A to recover  Upper Body Dressing : Minimal assistance Upper Body Dressing Details (indicate cue type and reason): due to lines  Lower Body Dressing: Moderate assistance, Sit to/from stand Lower Body Dressing Details (indicate cue type and reason): Pt with LOB. required mod A to recover  Toilet Transfer: Minimal assistance, Ambulation, Comfort height toilet, RW Toilet Transfer Details (indicate cue type and reason): walked to toilet Toileting- Clothing Manipulation and Hygiene: Minimal assistance, Sit to/from stand Functional mobility during ADLs: Minimal assistance, Moderate assistance General ADL Comments: Pt requires assist for balance      Mobility  Overal bed mobility: Needs Assistance Bed Mobility: Supine to Sit Supine to sit: Supervision General bed mobility comments: Use of bed rail and increased time. Cues to scoot to EOB for improved balance w/ feet on floor.    Transfers  Overall transfer  level: Needs assistance Equipment used: 1 person hand held assist Transfers: Sit to/from Stand, Stand Pivot Transfers Sit to Stand: Min assist Stand pivot transfers: Min assist General transfer comment: Pt requires min assist to steady due to weakness and ataxia in Lt LE. Cues to stand upright.    Ambulation / Gait / Stairs / Wheelchair Mobility  Ambulation/Gait Ambulation/Gait assistance: Museum/gallery curator (Feet): 100 Feet Assistive device: 1 person hand held assist Gait Pattern/deviations: Ataxic, Trunk flexed, Staggering left, Staggering right General Gait Details: Lt LE ataxia and pt reached out for armrail and bed in hallway to steady. Min assist to steady as pt staggers Lt and Rt. Cues for upright posture as pt looking down at feet. Gait velocity interpretation: Below normal speed for age/gender    Posture / Balance Balance Overall balance assessment: Needs assistance Sitting-balance support: Feet supported Sitting balance-Leahy Scale: Good Standing balance support: During functional activity Standing balance-Leahy Scale: Poor Standing balance comment: Pt with LOB during ADLs, required mod A to recover and prevent fall     Special needs/care consideration BiPAP/CPAP No CPM No Continuous Drip IV No Dialysis No  Life Vest No Oxygen No Special Bed No Trach Size No Wound Vac (area) no  Skin No  Bowel mgmt: Last BM 10/11/15 Bladder mgmt: Urinary catheter Diabetic mgmt No    Previous Home Environment Living Arrangements: Other relatives (grandson) Available Help at Discharge: Friend(s), Family (may be able to stay with brother if  needed?) Type of Home: House Home Layout: One level Home Access: Stairs to enter Entrance Stairs-Rails: Left Entrance Stairs-Number of Steps: 4 Bathroom Shower/Tub: Tub/shower unit, Architectural technologist: Standard Home Care Services: No Additional Comments: Might stay at brother's house at d/c, he lives in [place]. Family visiting pt today and pt will begin converasation with family about assist avaialble at d/c and where he might go.  Discharge Living Setting Plans for Discharge Living Setting: Patient's home, Alone, House (Lives alone) Type of Home at Discharge: House Discharge Home Layout: One level Discharge Home Access: Stairs to enter Entrance Stairs-Number of Steps: 5-7 steps back entry and 3-4 steps at front entry Does the patient have any problems obtaining your medications?: No  Social/Family/Support Systems Patient Roles: Other (Comment) (Has a long time friend.) Contact Information: Dayna Barker - friend [phone] Anticipated Caregiver: self Ability/Limitations of Caregiver: Has friends who may be able to stop by to assist at times Caregiver Availability: Intermittent Discharge Plan Discussed with Primary Caregiver: Yes Is Caregiver In Agreement with Plan?: Yes Does Caregiver/Family have Issues with Lodging/Transportation while Pt is in Rehab?: No  Goals/Additional Needs Patient/Family Goal for Rehab: PT/OT mod I goals Expected length of stay: 12-15 days Cultural Considerations: 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, attends church on Sundays Dietary Needs: Heart healthy, thin liquids Equipment Needs: TBD Pt/Family Agrees to Admission and willing to participate: Yes Program Orientation Provided & Reviewed with Pt/Caregiver Including Roles & Responsibilities: Yes  Decrease burden of Care through IP rehab admission: N/A  Possible need for SNF placement upon discharge: Not anticipated  Patient Condition: This patient's medical and functional status has  changed since the consult dated: 10/11/15 in which the Rehabilitation Physician determined and documented that the patient's condition is appropriate for intensive rehabilitative care in an inpatient rehabilitation facility. See "History of Present Illness" (above) for medical update. Functional changes are: Currently requiring min/mod assist for mobility and ADLs. Patient's medical and functional status update has been discussed with the Rehabilitation physician and patient remains appropriate  for inpatient rehabilitation. Will admit to inpatient rehab today.  Preadmission Screen Completed By: Retta Diones, 10/14/2015 3:51 PM ______________________________________________________________________  Discussed status with Dr. Posey Pronto on 10/14/15 at 1551 and received telephone approval for admission today.  Admission Coordinator: Retta Diones, time1551/Date03/13/17          Cosigned by: Ankit Lorie Phenix, MD at 10/14/2015 3:52 PM  Revision History     Date/Time User Provider Type Action   10/14/2015 3:52 PM Ankit Lorie Phenix, MD Physician Cosign   10/14/2015 3:51 PM Retta Diones, RN Rehab Admission Coordinator Sign

## 2015-10-15 ENCOUNTER — Inpatient Hospital Stay (HOSPITAL_COMMUNITY): Payer: Medicare Other | Admitting: Physical Therapy

## 2015-10-15 ENCOUNTER — Telehealth: Payer: Self-pay | Admitting: *Deleted

## 2015-10-15 ENCOUNTER — Inpatient Hospital Stay (HOSPITAL_COMMUNITY): Payer: Medicare Other

## 2015-10-15 DIAGNOSIS — I639 Cerebral infarction, unspecified: Secondary | ICD-10-CM | POA: Insufficient documentation

## 2015-10-15 DIAGNOSIS — E876 Hypokalemia: Secondary | ICD-10-CM

## 2015-10-15 DIAGNOSIS — D62 Acute posthemorrhagic anemia: Secondary | ICD-10-CM

## 2015-10-15 LAB — CBC WITH DIFFERENTIAL/PLATELET
BASOS PCT: 0 %
Basophils Absolute: 0 10*3/uL (ref 0.0–0.1)
EOS ABS: 0.2 10*3/uL (ref 0.0–0.7)
EOS PCT: 3 %
HCT: 31.4 % — ABNORMAL LOW (ref 39.0–52.0)
Hemoglobin: 9.5 g/dL — ABNORMAL LOW (ref 13.0–17.0)
LYMPHS ABS: 1.5 10*3/uL (ref 0.7–4.0)
Lymphocytes Relative: 24 %
MCH: 20.8 pg — AB (ref 26.0–34.0)
MCHC: 30.3 g/dL (ref 30.0–36.0)
MCV: 68.7 fL — AB (ref 78.0–100.0)
MONO ABS: 0.6 10*3/uL (ref 0.1–1.0)
Monocytes Relative: 9 %
Neutro Abs: 4 10*3/uL (ref 1.7–7.7)
Neutrophils Relative %: 64 %
PLATELETS: 190 10*3/uL (ref 150–400)
RBC: 4.57 MIL/uL (ref 4.22–5.81)
RDW: 15.4 % (ref 11.5–15.5)
WBC: 6.3 10*3/uL (ref 4.0–10.5)

## 2015-10-15 LAB — COMPREHENSIVE METABOLIC PANEL
ALT: 15 U/L — ABNORMAL LOW (ref 17–63)
ANION GAP: 7 (ref 5–15)
AST: 21 U/L (ref 15–41)
Albumin: 2.9 g/dL — ABNORMAL LOW (ref 3.5–5.0)
Alkaline Phosphatase: 46 U/L (ref 38–126)
BUN: 10 mg/dL (ref 6–20)
CHLORIDE: 104 mmol/L (ref 101–111)
CO2: 27 mmol/L (ref 22–32)
Calcium: 8.5 mg/dL — ABNORMAL LOW (ref 8.9–10.3)
Creatinine, Ser: 0.9 mg/dL (ref 0.61–1.24)
Glucose, Bld: 96 mg/dL (ref 65–99)
POTASSIUM: 3.4 mmol/L — AB (ref 3.5–5.1)
Sodium: 138 mmol/L (ref 135–145)
TOTAL PROTEIN: 7 g/dL (ref 6.5–8.1)
Total Bilirubin: 0.3 mg/dL (ref 0.3–1.2)

## 2015-10-15 MED ORDER — POTASSIUM CHLORIDE 20 MEQ PO PACK
40.0000 meq | PACK | Freq: Once | ORAL | Status: AC
  Administered 2015-10-15: 40 meq via ORAL
  Filled 2015-10-15: qty 2

## 2015-10-15 MED ORDER — CARVEDILOL 3.125 MG PO TABS
3.1250 mg | ORAL_TABLET | Freq: Two times a day (BID) | ORAL | Status: DC
  Administered 2015-10-15 – 2015-10-16 (×3): 3.125 mg via ORAL
  Filled 2015-10-15 (×3): qty 1

## 2015-10-15 NOTE — Telephone Encounter (Signed)
Pt was on TCM list admitted for Ischemic Stroke. Pt d/c 3/13, but sent to rehab once he is d/c from rehab will have f/u w/Dr. Leonie Man..Pt hasn't seen Dr. [name] since 2015 nit sure if he still his PCP...[name]

## 2015-10-15 NOTE — Progress Notes (Signed)
[facility] PHYSICAL MEDICINE & REHABILITATION     PROGRESS NOTE  Subjective/Complaints: Pt sleeping in bed this AM.  He slept well overnight and is ready to begin his day of therapies.   ROS: Denies CP, SOB, N/V/D.  Objective: Vital Signs: Blood pressure 164/76, pulse 83, temperature 98.3 F (36.8 C), temperature source Oral, resp. rate 20, height 5' 6.5" (1.689 m), weight 76.114 kg (167 lb 12.8 oz), SpO2 98 %. No results found.  Recent Labs  10/15/15 0524  WBC 6.3  HGB 9.5*  HCT 31.4*  PLT 190    Recent Labs  10/14/15 0808 10/15/15 0524  NA  --  138  K 3.5 3.4*  CL  --  104  GLUCOSE  --  96  BUN  --  10  CREATININE  --  0.90  CALCIUM  --  8.5*   CBG (last 3)  No results for input(s): GLUCAP in the last 72 hours.  Wt Readings from Last 3 Encounters:  10/14/15 76.114 kg (167 lb 12.8 oz)  10/10/15 78.291 kg (172 lb 9.6 oz)  06/21/15 76.522 kg (168 lb 11.2 oz)    Physical Exam:  BP 164/76 mmHg  Pulse 83  Temp(Src) 98.3 F (36.8 C) (Oral)  Resp 20  Ht 5' 6.5" (1.689 m)  Wt 76.114 kg (167 lb 12.8 oz)  BMI 26.68 kg/m2  SpO2 98% Constitutional: He appears well-developed and well-nourished.  HENT: Normocephalic and atraumatic.  Eyes: Conjunctivae and EOM are normal.  Cardiovascular: Normal rate and regular rhythm.  Respiratory: Effort normal and breath sounds normal. No respiratory distress.  GI: Soft. Bowel sounds are normal. He exhibits no distension.  Musculoskeletal: He exhibits no edema or tenderness.  Neurological: He is alert.  Mood is a bit flat but appropriate.  He does have awareness of his deficits Motor: b/l UE 4+/5, B/l LE 5/5 (right slightly stronger than left)  Mild ataxia LUE>RUE  Skin: Skin is warm and dry.  Psychiatric: He has a normal mood and affect. His behavior is normal  Assessment/Plan: 1. Functional deficits secondary to right parietal occipital lobe infarct which require 3+ hours per day of interdisciplinary therapy in a  comprehensive inpatient rehab setting. Physiatrist is providing close team supervision and 24 hour management of active medical problems listed below. Physiatrist and rehab team continue to assess barriers to discharge/monitor patient progress toward functional and medical goals.  Function:  Bathing Bathing position      Bathing parts      Bathing assist        Upper Body Dressing/Undressing Upper body dressing                    Upper body assist        Lower Body Dressing/Undressing Lower body dressing                                  Lower body assist        Toileting Toileting          Toileting assist     Transfers Chair/bed transfer             Locomotion Ambulation           Wheelchair          Cognition Comprehension Comprehension assist level: Understands basic 90% of the time/cues < 10% of the time  Expression Expression assist level: Expresses basic 90% of  the time/requires cueing < 10% of the time.  Social Interaction Social Interaction assist level: Interacts appropriately 90% of the time - Needs monitoring or encouragement for participation or interaction.  Problem Solving Problem solving assist level: Solves basic 75 - 89% of the time/requires cueing 10 - 24% of the time  Memory Memory assist level: Recognizes or recalls 90% of the time/requires cueing < 10% of the time    Medical Problem List and Plan: 1. Left sided weakness secondary to right parietal occipital lobe infarct on 3/6  Begin CIR 2. DVT Prophylaxis/Anticoagulation: Eliquis 3. Pain Management: Oxycodone as needed 4. Symptomatic right carotid stenosis. 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 Follow-up vascular surgery as needed 5. Neuropsych: This patient is capable of making decisions on his own behalf. 6. Skin/Wound Care: Routine skin checks 7. Fluids/Electrolytes/Nutrition: Routine I&O   Hypokalemia 3.4 on 3/14, will replete and continue  to monitor  8. Hypertension/PAF. Norvasc 10 mg daily, Coreg 3.125 mg twice a day, Lasix 20 mg daily. Monitor with increased mobility 9. Coronary artery disease. No chest pain or shortness of breath. Patient currently remains on low dose aspirin 10. Hyperlipidemia. Lipitor/zetia 11. BPH. Flomax 0.4 mg daily. Check PVR 3 12. ABLA  Hb 9.5 on 3/14  Will cont to monitor  LOS (Days) 1 A FACE TO FACE EVALUATION WAS PERFORMED  Alnita Aybar Lorie Phenix 10/15/2015 8:24 AM

## 2015-10-15 NOTE — Progress Notes (Signed)
Social Work  Social Work Assessment and Plan  Patient Details  Name: [name] MRN: [id number] Date of Birth: 02-09-1941  Today's Date: 10/15/2015  Problem List:  Patient Active Problem List   Diagnosis Date Noted  . Right-sided cerebrovascular accident (CVA) ([place])   . Hypokalemia   . Paroxysmal atrial fibrillation (HCC)   . Carotid artery stenosis   . S/P carotid endarterectomy   . Coronary artery disease involving native coronary artery of native heart without angina pectoris   . HLD (hyperlipidemia)   . Benign essential HTN   . Tachycardia   . Acute blood loss anemia   . Near syncope   . Prediabetes 10/08/2015  . Right cavernous carotid stenosis   . Carotid stenosis   . Acute ischemic stroke ([place]) 10/07/2015  . Chest pain 06/19/2015  . DOE (dyspnea on exertion) 06/19/2015  . Hypertensive urgency 06/19/2015  . Wheezing   . Bilateral arm pain 06/26/2014  . 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 Impingement syndrome of left shoulder 01/20/2013  . Atrial fibrillation ([place]) 04/18/2012  . Leg pain 01/28/2012  . Dizziness and giddiness 11/19/2011  . Dehydration 11/19/2011  . CKD (chronic kidney disease), stage III 11/19/2011  . HTN (hypertension) 11/19/2011  . Dyslipidemia 11/19/2011  . PAF (paroxysmal atrial fibrillation) ([place]) 11/19/2011  . Erectile dysfunction 08/20/2011  . Nocturia 08/20/2011  . Preventative health care 08/15/2011  . Anemia, iron deficiency 08/15/2011  . History of pneumonia 08/15/2011  . CAD (coronary artery disease) 08/15/2011  . History of MI (myocardial infarction) 08/15/2011  . Dilated aortic root ([place]) 08/15/2011  . Syncope 07/16/2011  . Claudication ([place]) 07/16/2011  . BPH (benign prostatic hyperplasia) 07/16/2011  . Rash 04/29/2011  . CVA (cerebral infarction) 03/31/2011  . Inguinal hernia 03/31/2011   Past Medical History:  Past Medical History  Diagnosis Date  . Hypertension     echocardiogram 6/10: Moderate LVH, EF 55-65%,  mild, mild MR, MAC, mild LAE, PASP 32  . Dyslipidemia   . History of stroke   . CAD (coronary artery disease)     cath in 1/08: EF 60%, mild dilated Ao root, oD2 30%, LAD 20-30%.  . Paroxysmal atrial fibrillation ([place])   . Anemia, iron deficiency 08/15/2011  . History of pneumonia 08/15/2011  . History of MI (myocardial infarction) 08/15/2011  . Dilated aortic root ([place]) 08/15/2011  . Stroke ([place]) 10/2011  . Myocardial infarction ([place])   . Hyperlipidemia   . Shortness of breath dyspnea   . Prediabetes 10/08/2015  . Preoperative cardiovascular examination 10/09/2015   Past Surgical History:  Past Surgical History  Procedure Laterality Date  . Cardiac catheterization    . Endarterectomy Right 10/10/2015    Procedure: ENDARTERECTOMY CAROTID-RIGHT;  Surgeon: Serafina Mitchell, MD;  Location: [facility] OR;  Service: Vascular;  Laterality: Right;   Social History:  reports that he has never smoked. He has never used smokeless tobacco. He reports that he does not drink alcohol or use illicit drugs.  Family / Support Systems Marital Status: Widow/Widower Patient Roles: Parent, Other (Comment) (Friend, Conservation officer, historic buildings) Children: Two daughter's in [place] Other Supports: Brother in [place] Anticipated Caregiver: self Ability/Limitations of Caregiver: friends may be able to check on him Caregiver Availability: Intermittent Family Dynamics: Difficult to obtain information from pt he jumps from topic to topic and talks about his faith most of the time. It is difficult to know if his daughter's see him or his brother. He talks about his church members and how supportive  they are. He has a friend-Connie he does things with.  Social History Preferred language: English Religion: Christian Cultural Background: No issues Education: 8th grade Read: Yes (limited) Write: Yes (limited) Employment Status: Retired Freight forwarder Issues: No issues Guardian/Conservator: None-according to MD pt is  capable of making his own decisions while here.    Abuse/Neglect Physical Abuse: Denies Verbal Abuse: Denies Sexual Abuse: Denies Exploitation of patient/patient's resources: Denies Self-Neglect: Denies  Emotional Status Pt's affect, behavior adn adjustment status: Pt is motivated to improve and feels with the Lord's help he will recover fully from this stroke, like he has in the past when he had another stroke-2013. He has always been independent and able to take care of himself and he will again, he is confident of this. Recent Psychosocial Issues: other health issues-CVA in 2013.  Pyschiatric History: No history feels he is doing well and places his faith in God and his spiritual being. 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 Discussed he has every right to feel the way he does and he should not hold himself to a higher level. Will discuss with team and have neuro-psych see if all feel would benefit from this while here. Substance Abuse History: No issues  Patient / Family Perceptions, Expectations & Goals Pt/Family understanding of illness & functional limitations: Pt can explain his stroke and deficits. He talks with the MD rounding and feels his questions are being answered. He is confident he will recover and be completely independent again like he was after his stroke in 2013.  Premorbid pt/family roles/activities: Widower, Father, grandfather, brother, friend, Theme park manager, etc Anticipated changes in roles/activities/participation: resume Pt/family expectations/goals: Pt states: " I plan to be independent by the time I leave here, God will make sure of it."  [employer]: None Premorbid Home Care/DME Agencies: None Transportation available at discharge: Jeris Penta, he was driving prior to admission Resource referrals recommended: Support group (specify)  Discharge Planning Living Arrangements: Alone Support Systems:  Children, Other relatives, Water engineer, Social worker community Type of Residence: Private residence Insurance Resources: Multimedia programmer (specify) (UHC_Medicare) Financial Resources: Radio broadcast assistant Screen Referred: No Living Expenses: Own Money Management: Patient Does the patient have any problems obtaining your medications?: No Home Management: self Patient/Family Preliminary Plans: Return home with friends checking in on him, no family have come forward to confirm if they would assist him at discharge. Will try to talk wiht Connie-friend to see if she can come by consistently and take him to his appointments. Pt is hgih level and will be a short length of stay-currently at light min assist level Social Work Anticipated Follow Up Needs: HH/OP, Support Group  Clinical Impression Pt is pleasant and likes to talk, it is difficult to obtain information from him due jumps topics and at times this worker had no idea what he was taking about. He should reach mod/i level and plans to go home  No matter what level he is, due to God will heal him like he did before. He has limited supports and someone may not be by daily to check on him. Will see if church members or [name] can check on him daily when first goes home  From the hospital. Should be a short length of stay due to his high level. Work on safe discharge plan.  Elease Hashimoto 10/15/2015, 2:26 PM

## 2015-10-15 NOTE — Evaluation (Signed)
Occupational Therapy Assessment and Plan  Patient Details  Name: [name] MRN: [id number] Date of Birth: Jul 18, 1941  OT Diagnosis: muscle weakness (generalized) Rehab Potential: Rehab Potential (ACUTE ONLY): Excellent ELOS: 7 days   Today's Date: 10/15/2015 OT Individual Time: 0998-3382 OT Individual Time Calculation (min): 60 min     Problem List:  Patient Active Problem List   Diagnosis Date Noted  . Right-sided cerebrovascular accident (CVA) ([place])   . Hypokalemia   . Paroxysmal atrial fibrillation (HCC)   . Carotid artery stenosis   . S/P carotid endarterectomy   . Coronary artery disease involving native coronary artery of native heart without angina pectoris   . HLD (hyperlipidemia)   . Benign essential HTN   . Tachycardia   . Acute blood loss anemia   . Near syncope   . Prediabetes 10/08/2015  . Right cavernous carotid stenosis   . Carotid stenosis   . Acute ischemic stroke ([place]) 10/07/2015  . Chest pain 06/19/2015  . DOE (dyspnea on exertion) 06/19/2015  . Hypertensive urgency 06/19/2015  . Wheezing   . Bilateral arm pain 06/26/2014  . 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 Impingement syndrome of left shoulder 01/20/2013  . Atrial fibrillation ([place]) 04/18/2012  . Leg pain 01/28/2012  . Dizziness and giddiness 11/19/2011  . Dehydration 11/19/2011  . CKD (chronic kidney disease), stage III 11/19/2011  . HTN (hypertension) 11/19/2011  . Dyslipidemia 11/19/2011  . PAF (paroxysmal atrial fibrillation) ([place]) 11/19/2011  . Erectile dysfunction 08/20/2011  . Nocturia 08/20/2011  . Preventative health care 08/15/2011  . Anemia, iron deficiency 08/15/2011  . History of pneumonia 08/15/2011  . CAD (coronary artery disease) 08/15/2011  . History of MI (myocardial infarction) 08/15/2011  . Dilated aortic root ([place]) 08/15/2011  . Syncope 07/16/2011  . Claudication ([place]) 07/16/2011  . BPH (benign prostatic hyperplasia) 07/16/2011  . Rash 04/29/2011  . CVA  (cerebral infarction) 03/31/2011  . Inguinal hernia 03/31/2011    Past Medical History:  Past Medical History  Diagnosis Date  . Hypertension     echocardiogram 6/10: Moderate LVH, EF 55-65%, mild, mild MR, MAC, mild LAE, PASP 32  . Dyslipidemia   . History of stroke   . CAD (coronary artery disease)     cath in 1/08: EF 60%, mild dilated Ao root, oD2 30%, LAD 20-30%.  . Paroxysmal atrial fibrillation ([place])   . Anemia, iron deficiency 08/15/2011  . History of pneumonia 08/15/2011  . History of MI (myocardial infarction) 08/15/2011  . Dilated aortic root ([place]) 08/15/2011  . Stroke ([place]) 10/2011  . Myocardial infarction ([place])   . Hyperlipidemia   . Shortness of breath dyspnea   . Prediabetes 10/08/2015  . Preoperative cardiovascular examination 10/09/2015   Past Surgical History:  Past Surgical History  Procedure Laterality Date  . Cardiac catheterization    . Endarterectomy Right 10/10/2015    Procedure: ENDARTERECTOMY CAROTID-RIGHT;  Surgeon: Serafina Mitchell, MD;  Location: [facility] OR;  Service: Vascular;  Laterality: Right;    Assessment & Plan Clinical Impression: Patient is a 75 y.o. right handed male with history of hypertension, hyperlipidemia, coronary artery disease maintained on aspirin 81 mg daily, PAF. Patient lives alone and independent prior to admission and driving but he does have a significant other that helps him when needed but cannot provide 24-hour assistance. One level home with 3 steps to entry. He has a brother in [place] but again cannot provide 24-hour care. Presented 10/07/2015 with left-sided weakness. Blood pressure 220/100. Troponin 0.02.  MRI of the brain showed patchy acute ischemic nonhemorrhagic infarct involving the cortical gray matter and deep white matter of the right frontal parietal and occipital lobes.  Neurology consulted and maintained on Eliquis and low-dose aspirin for CVA prophylaxis. Tolerating a regular consistency diet..  Patient transferred to CIR  on 10/14/2015 .    Patient currently requires minimum assistance with basic self-care skills secondary to decreased motor planning.  Prior to hospitalization, patient could complete BADL/iADL independently.   Patient will benefit from skilled intervention to increase independence with basic self-care skills and increase level of independence with iADL prior to discharge home independently.  Anticipate patient will require no assistance with BADL and follow up outpatient to enhance performance in iADL.  OT - End of Session Activity Tolerance: Tolerates 30+ min activity with multiple rests Endurance Deficit: Yes OT Assessment Rehab Potential (ACUTE ONLY): Excellent Barriers to Discharge: Decreased caregiver support OT Basic ADL's Functional Problem(s): Grooming;Bathing;Dressing;Toileting OT Advanced ADL's Functional Problem(s): Simple Meal Preparation;Light Housekeeping;Laundry OT Transfers Functional Problem(s): Toilet;Tub/Shower OT Plan OT Intensity: Minimum of 1-2 x/day, 45 to 90 minutes OT Frequency: 5 out of 7 days OT Duration/Estimated Length of Stay: 7 days OT Treatment/Interventions: Balance/vestibular training;Discharge planning;Functional mobility training;Self Care/advanced ADL retraining;Therapeutic Activities;Therapeutic Exercise;Patient/family education;DME/adaptive equipment instruction OT Self Feeding Anticipated Outcome(s): Mod I OT Basic Self-Care Anticipated Outcome(s): Mod I OT Toileting Anticipated Outcome(s): Mod I OT Bathroom Transfers Anticipated Outcome(s): Mod I OT Recommendation Patient destination: Home Follow Up Recommendations: Outpatient OT Equipment Recommended: To be determined   Skilled Therapeutic Intervention OT 1:1 evaluation completed with treatment to focus on orientation to goals and methods of treatment, functional mobility, transfers, safety awareness and adapted bathing/dressing skills.   Pt had only one set of soiled clothing with him and  therefore returned to clean gown after bathing sitting/standing in shower with setup and supervision.   Pt required standby assist to manage catheter bag and to problem-solve while using RW for mobility and transfers.   Pt remained at sink at end of session grooming; RN made aware.   Call light within reach at w/c level.   OT Evaluation Precautions/Restrictions  Precautions Precautions: Fall Restrictions Weight Bearing Restrictions: No  General Chart Reviewed: Yes Family/Caregiver Present: No  Pain Pain Assessment Pain Assessment: No/denies pain Pain Score: 0-No pain  Home Living/Prior Functioning Home Living Family/patient expects to be discharged to:: Private residence Living Arrangements: Other (Comment) Available Help at Discharge: Friend(s) Type of Home: House Home Access: Stairs to enter Technical brewer of Steps: 4 Entrance Stairs-Rails: Left Home Layout: One level Bathroom Shower/Tub: Tub/shower unit, Architectural technologist: Programmer, systems: Yes  Lives With: Alone IADL History Homemaking Responsibilities: Yes Meal Prep Responsibility: Primary Laundry Responsibility: Primary Cleaning Responsibility: Primary Bill Paying/Finance Responsibility: Primary Shopping Responsibility: Primary Child Care Responsibility: Primary Current License: Yes Mode of Transportation: Car Education: 8th grade  Occupation: Retired Type of Occupation: Custodian at [employer] and Hobbies: religion Prior Function Level of Independence: Independent with basic ADLs, Independent with transfers, Independent with gait  Able to Take Stairs?: Yes Driving: Yes Vocation: Retired  ADL ADL ADL Comments: see Functional Assessment Tool Vision/Perception  Vision- History Baseline Vision/History: Wears glasses Wears Glasses: At all times Patient Visual Report: Other (comment) (states he needs a new prescription) Vision- Assessment Vision Assessment?: Yes Eye  Alignment: Within Functional Limits Visual Fields: No apparent deficits Perception Comments: WFL   Cognition Overall Cognitive Status: Within Functional Limits for tasks assessed Arousal/Alertness: Awake/alert Orientation Level: Person;Place;Situation Person: Oriented Place: Oriented  Situation: Oriented Year: 2017 Month: March Day of Week: Incorrect Memory: Impaired Memory Impairment: Decreased recall of new information Immediate Memory Recall: Sock;Blue;Bed Memory Recall: Sock;Blue;Bed Memory Recall Sock: With Cue Memory Recall Blue: Without Cue Memory Recall Bed: Without Cue Attention: Sustained Sustained Attention: Appears intact Awareness: Appears intact Awareness Impairment: Emergent impairment Problem Solving: Impaired Problem Solving Impairment: Functional complex;Verbal complex Safety/Judgment: Appears intact  Sensation Sensation Light Touch: Impaired by gross assessment (@ LLE) Stereognosis: Appears Intact Hot/Cold: Appears Intact Proprioception: Appears Intact Coordination Gross Motor Movements are Fluid and Coordinated: Yes Fine Motor Movements are Fluid and Coordinated: Yes  Motor  Motor Motor: Within Functional Limits  Mobility  Bed Mobility Bed Mobility: Rolling Right;Right Sidelying to Sit;Supine to Sit;Sitting - Scoot to [employer] of Bed Rolling Right: 6: Modified independent (Device/Increase time) Right Sidelying to Sit: 6: Modified independent (Device/Increase time) Supine to Sit: 6: Modified independent (Device/Increase time) Sitting - Scoot to Edge of Bed: 6: Modified independent (Device/Increase time) Transfers Transfers: Sit to Stand;Stand to Sit Sit to Stand: 5: Supervision Sit to Stand Details: Verbal cues for precautions/safety;Visual cues for safe use of DME/AE Stand to Sit: 5: Supervision Stand to Sit Details (indicate cue type and reason): Verbal cues for precautions/safety;Visual cues for safe use of DME/AE   Trunk/Postural Assessment   Cervical Assessment Cervical Assessment: Within Functional Limits Thoracic Assessment Thoracic Assessment: Within Functional Limits Lumbar Assessment Lumbar Assessment: Within Functional Limits Postural Control Postural Control: Within Functional Limits   Balance     Extremity/Trunk Assessment RUE Assessment RUE Assessment: Within Functional Limits LUE Assessment LUE Assessment: Within Functional Limits   See Function Navigator for Current Functional Status.   Refer to Care Plan for Long Term Goals  Recommendations for other services: None  Discharge Criteria: Patient will be discharged from OT if patient refuses treatment 3 consecutive times without medical reason, if treatment goals not met, if there is a change in medical status, if patient makes no progress towards goals or if patient is discharged from hospital.  The above assessment, treatment plan, treatment alternatives and goals were discussed and mutually agreed upon: by patient  [facility] 10/15/2015, 12:48 PM

## 2015-10-15 NOTE — Progress Notes (Signed)
Patient information reviewed and entered into eRehab system by Phylicia Mcgaugh, RN, CRRN, PPS Coordinator.  Information including medical coding and functional independence measure will be reviewed and updated through discharge.     Per nursing patient was given "Data Collection Information Summary for Patients in Inpatient Rehabilitation Facilities with attached "Privacy Act Statement-Health Care Records" upon admission.  

## 2015-10-15 NOTE — Plan of Care (Signed)
Problem: RH Balance Goal: LTG Patient will maintain dynamic standing balance (PT) LTG: Patient will maintain dynamic standing balance with assistance during mobility activities (PT) With LRAD  Problem: RH Bed Mobility Goal: LTG Patient will perform bed mobility with assist (PT) LTG: Patient will perform bed mobility with assistance, with/without cues (PT). Without bed features  Problem: RH Bed to Chair Transfers Goal: LTG Patient will perform bed/chair transfers w/assist (PT) LTG: Patient will perform bed/chair transfers with assistance, with/without cues (PT). With LRAD  Problem: RH Car Transfers Goal: LTG Patient will perform car transfers with assist (PT) LTG: Patient will perform car transfers with assistance (PT). With LRAD  Problem: RH Furniture Transfers Goal: LTG Patient will perform furniture transfers w/assist (OT/PT LTG: Patient will perform furniture transfers with assistance (OT/PT). With LRAD  Problem: RH Floor Transfers Goal: LTG Patient will perform floor transfers w/assist (PT) LTG: Patient will perform floor transfers with assistance (PT). With LRAD  Problem: RH Ambulation Goal: LTG Patient will ambulate in controlled environment (PT) LTG: Patient will ambulate in a controlled environment, # of feet with assistance (PT). With LRAD Goal: LTG Patient will ambulate in home environment (PT) LTG: Patient will ambulate in home environment, # of feet with assistance (PT). With LRAD Goal: LTG Patient will ambulate in community environment (PT) LTG: Patient will ambulate in community environment, # of feet with assistance (PT). With LRAD  Problem: RH Stairs Goal: LTG Patient will ambulate up and down stairs w/assist (PT) LTG: Patient will ambulate up and down # of stairs with assistance (PT) 4 steps with L ascending rail

## 2015-10-15 NOTE — Plan of Care (Signed)
Problem: RH BLADDER ELIMINATION Goal: RH STG MANAGE BLADDER WITH ASSISTANCE STG Manage Bladder With Mod Assistance  Outcome: Not Progressing [place] for retention  Goal: RH STG MANAGE BLADDER WITH MEDICATION WITH ASSISTANCE STG Manage Bladder With Medication With Mod Assistance.  Outcome: Not Progressing [place] for retention  Goal: RH STG MANAGE BLADDER WITH EQUIPMENT WITH ASSISTANCE STG Manage Bladder With Equipment With Mod Assistance  Outcome: Not Progressing [place] for retention

## 2015-10-15 NOTE — Patient Care Conference (Signed)
Inpatient RehabilitationTeam Conference and Plan of Care Update Date: 10/15/2015   Time: 11:25 AM    Patient Name: [name]      Medical Record Number: [id number]  Date of Birth: January 10, 1941 Sex: Male         Room/Bed: 4M06C/4M06C-01 Payor Info: Payor: Marine scientist / Plan: [employer] MEDICARE / Product Type: *No Product type* /    Admitting Diagnosis: R CVA  Admit Date/Time:  10/14/2015  5:06 PM Admission Comments: No comment available   Primary Diagnosis:  CVA (cerebral infarction) Principal Problem: CVA (cerebral infarction)  Patient Active Problem List   Diagnosis Date Noted  . Essential hypertension   . Urinary retention   . Right-sided cerebrovascular accident (CVA) ([place])   . Hypokalemia   . Paroxysmal atrial fibrillation (HCC)   . Carotid artery stenosis   . S/P carotid endarterectomy   . Coronary artery disease involving native coronary artery of native heart without angina pectoris   . HLD (hyperlipidemia)   . Benign essential HTN   . Tachycardia   . Acute blood loss anemia   . Near syncope   . Prediabetes 10/08/2015  . Right cavernous carotid stenosis   . Carotid stenosis   . Acute ischemic stroke ([place]) 10/07/2015  . Chest pain 06/19/2015  . DOE (dyspnea on exertion) 06/19/2015  . Hypertensive urgency 06/19/2015  . Wheezing   . Bilateral arm pain 06/26/2014  . 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 Impingement syndrome of left shoulder 01/20/2013  . Atrial fibrillation ([place]) 04/18/2012  . Leg pain 01/28/2012  . Dizziness and giddiness 11/19/2011  . Dehydration 11/19/2011  . CKD (chronic kidney disease), stage III 11/19/2011  . HTN (hypertension) 11/19/2011  . Dyslipidemia 11/19/2011  . PAF (paroxysmal atrial fibrillation) ([place]) 11/19/2011  . Erectile dysfunction 08/20/2011  . Nocturia 08/20/2011  . Preventative health care 08/15/2011  . Anemia, iron deficiency 08/15/2011  . History of pneumonia 08/15/2011  . CAD (coronary artery disease)  08/15/2011  . History of MI (myocardial infarction) 08/15/2011  . Dilated aortic root ([place]) 08/15/2011  . Syncope 07/16/2011  . Claudication ([place]) 07/16/2011  . BPH (benign prostatic hyperplasia) 07/16/2011  . Rash 04/29/2011  . CVA (cerebral infarction) 03/31/2011  . Inguinal hernia 03/31/2011    Expected Discharge Date: Expected Discharge Date: 10/21/15  Team Members Present: Physician leading conference: Dr. Delice Lesch Social Worker Present: Ovidio Kin, LCSW Nurse Present: Dorien Chihuahua, RN PT Present: Raylene Everts, PT;Other (comment) Myrtie Hawk) OT Present: Salome Spotted, OT;Jennifer Tamala Julian, OT SLP Present: Windell Moulding, SLP PPS Coordinator present : Daiva Nakayama, RN, CRRN     Current Status/Progress Goal Weekly Team Focus  Medical   Left sided weakness secondary to right parietal occipital lobe infarct on 3/6  Improve safety, mobility  see above   Bowel/Bladder   pt cont of bowel LBM: 10/14/15. foley for retnetion   pt will manage bowel and bladder with min assist   remove or continue foley?    Swallow/Nutrition/ Hydration     na        ADL's   Overall setup and supervision for BADL and transfer, steadying assist during dynamic standing balance,   Overall Mod I  Improved awareness, dynamic standing balance, home making, activity tolerance, problem-solving, family ed   Mobility   supervision/CGA for ambulation and transfers, Supervision for bed mobility, ambulating 160 ft with RW & S  Mod I for all transfers, stair negotiation & ambulation  high level balance training, stair negotiation, endurance training,  safety/education   Communication     na        Safety/Cognition/ Behavioral Observations    no unsafe behaviors        Pain   denies pain          Skin   Right carotid with skin glue. Skin otherwise C, D, I   skin will remain free from infection/ breakdown while on rehab   assess skin and routine pressure relief       *See Care Plan and progress notes  for long and short-term goals.  Barriers to Discharge: Hypokalemia, ABLA    Possible Resolutions to Barriers:  K+ repleted, monitor labs    Discharge Planning/Teaching Needs:    Home with intermittent assist from friend, family out of town. Pt needs to be mod/i to return home alone     Team Discussion:  New eval-goals mod/i level. No speech orders. DC foley today started on flomax today and see if pt can void on his own. MD working on medical issues. Currently min assist level  Revisions to Treatment Plan:  New eval   Continued Need for Acute Rehabilitation Level of Care: The patient requires daily medical management by a physician with specialized training in physical medicine and rehabilitation for the following conditions: Daily direction of a multidisciplinary physical rehabilitation program to ensure safe treatment while eliciting the highest outcome that is of practical value to the patient.: Yes Daily medical management of patient stability for increased activity during participation in an intensive rehabilitation regime.: Yes Daily analysis of laboratory values and/or radiology reports with any subsequent need for medication adjustment of medical intervention for : Neurological problems;Blood pressure problems  Tanaia Hawkey, Gardiner Rhyme 10/16/2015, 9:55 AM

## 2015-10-15 NOTE — IPOC Note (Signed)
Overall Plan of Care [facility]) Patient Details Name: [name] MRN: [id number] DOB: [birth year]  Admitting Diagnosis: R CVA  Hospital Problems: Principal Problem:   CVA (cerebral infarction) Active Problems:   Carotid artery stenosis   S/P carotid endarterectomy   Coronary artery disease involving native coronary artery of native heart without angina pectoris   HLD (hyperlipidemia)   Right-sided cerebrovascular accident (CVA) ([place])   Hypokalemia     Functional Problem List: Nursing Bladder, Bowel, Endurance, Motor, Medication Management, Perception, Nutrition, Safety, Skin Integrity  PT Balance, Safety, Sensory, Endurance  OT    SLP    TR         Basic ADL's: OT Grooming, Bathing, Dressing, Toileting     Advanced  ADL's: OT Simple Meal Preparation, Light Housekeeping, Laundry     Transfers: PT Bed Mobility, Bed to Chair, Musician, [employer], Futures trader, Metallurgist: PT Ambulation, Stairs     Additional Impairments: OT    SLP        TR      Anticipated Outcomes Item Anticipated Outcome  Self Feeding Mod I  Swallowing      Basic self-care  Mod I  Toileting  Mod I   Bathroom Transfers Mod I  Bowel/Bladder  Pt will manage bowel and bladder with mod assist upon discharge   Transfers  Mod I  Locomotion  Mod I with LRAD  Communication     Cognition     Pain  Pt will rate pain <4 out of 10   Safety/Judgment  Pt will remain free of falls and injury while on rehab with min assist    Therapy Plan: PT Intensity: Minimum of 1-2 x/day ,45 to 90 minutes PT Frequency: 5 out of 7 days PT Duration Estimated Length of Stay: 7 days OT Intensity: Minimum of 1-2 x/day, 45 to 90 minutes OT Frequency: 5 out of 7 days OT Duration/Estimated Length of Stay: 7 days         Team Interventions: Nursing Interventions Bladder Management, Disease Management/Prevention, Bowel Management, Medication Management, Skin Care/Wound Management, Discharge  Planning  PT interventions Ambulation/gait training, Balance/vestibular training, Neuromuscular re-education, Patient/family education, Stair training, Therapeutic Exercise, UE/LE Coordination activities, UE/LE Strength taining/ROM, Therapeutic Activities, Splinting/orthotics, Functional mobility training, DME/adaptive equipment instruction, Discharge planning  OT Interventions Balance/vestibular training, Discharge planning, Functional mobility training, Self Care/advanced ADL retraining, Therapeutic Activities, Therapeutic Exercise, Patient/family education, DME/adaptive equipment instruction  SLP Interventions    TR Interventions    SW/CM Interventions Discharge Planning, Psychosocial Support, Patient/Family Education    Team Discharge Planning: Destination: PT-Home ,OT- Home , SLP-  Projected Follow-up: PT-Home health PT, OT-  Outpatient OT, SLP-  Projected Equipment Needs: PT-Rolling walker with 5" wheels, OT- To be determined, SLP-  Equipment Details: PT- , OT-  Patient/family involved in discharge planning: PT- Patient,  OT-Patient, SLP-   MD ELOS: 7-10 days. Medical Rehab Prognosis:  Good Assessment: 75 y.o. right handed male with history of hypertension, hyperlipidemia, coronary artery disease maintained on aspirin 81 mg daily, PAF. Patient lives alone and independent prior to admission and driving but he does have a significant other that helps him when needed but cannot provide 24-hour assistance. Presented 10/07/2015 with left-sided weakness. Blood pressure 220/100. Troponin 0.02. MRI of the brain showed patchy acute ischemic nonhemorrhagic infarct involving the cortical gray matter and deep white matter of the right frontal parietal and occipital lobes. Echocardiogram with ejection fraction of 60%. Normal systolic function. Patient did not  receive TPA. CTA of head and neck showed stenosis at the right ICA origin related to low density plaque or thrombus. Superimposed severe intracranial  atherosclerotic type changes including high-grade distal right ICA stenosis. Cerebral angiogram showed preocclusive stenosis right ICA proximal. 80% percent stenosis right ICA supraclinoid segment 50% cavernous segment. Vascular surgery consult that underwent right carotid endarterectomy 10/10/2015 per Dr. Trula Slade. Neurology consulted And maintained on Eliquis and low-dose aspirin for CVA prophylaxis.   See Team Conference Notes for weekly updates to the plan of care

## 2015-10-15 NOTE — Evaluation (Signed)
Physical Therapy Assessment and Plan  Patient Details  Name: [name] MRN: [id number] Date of Birth: 1940-10-07  PT Diagnosis: Abnormality of gait, Coordination disorder, Difficulty walking, Impaired sensation and Muscle weakness Rehab Potential: Good ELOS: 7 days   Today's Date: 10/15/2015 PT Individual Time: 0945-1100 PT Individual Time Calculation (min): 75 min    Problem List:  Patient Active Problem List   Diagnosis Date Noted  . Right-sided cerebrovascular accident (CVA) ([place])   . Hypokalemia   . Paroxysmal atrial fibrillation (HCC)   . Carotid artery stenosis   . S/P carotid endarterectomy   . Coronary artery disease involving native coronary artery of native heart without angina pectoris   . HLD (hyperlipidemia)   . Benign essential HTN   . Tachycardia   . Acute blood loss anemia   . Near syncope   . Prediabetes 10/08/2015  . Right cavernous carotid stenosis   . Carotid stenosis   . Acute ischemic stroke ([place]) 10/07/2015  . Chest pain 06/19/2015  . DOE (dyspnea on exertion) 06/19/2015  . Hypertensive urgency 06/19/2015  . Wheezing   . Bilateral arm pain 06/26/2014  . 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 Impingement syndrome of left shoulder 01/20/2013  . Atrial fibrillation ([place]) 04/18/2012  . Leg pain 01/28/2012  . Dizziness and giddiness 11/19/2011  . Dehydration 11/19/2011  . CKD (chronic kidney disease), stage III 11/19/2011  . HTN (hypertension) 11/19/2011  . Dyslipidemia 11/19/2011  . PAF (paroxysmal atrial fibrillation) ([place]) 11/19/2011  . Erectile dysfunction 08/20/2011  . Nocturia 08/20/2011  . Preventative health care 08/15/2011  . Anemia, iron deficiency 08/15/2011  . History of pneumonia 08/15/2011  . CAD (coronary artery disease) 08/15/2011  . History of MI (myocardial infarction) 08/15/2011  . Dilated aortic root ([place]) 08/15/2011  . Syncope 07/16/2011  . Claudication ([place]) 07/16/2011  . BPH (benign prostatic hyperplasia) 07/16/2011   . Rash 04/29/2011  . CVA (cerebral infarction) 03/31/2011  . Inguinal hernia 03/31/2011    Past Medical History:  Past Medical History  Diagnosis Date  . Hypertension     echocardiogram 6/10: Moderate LVH, EF 55-65%, mild, mild MR, MAC, mild LAE, PASP 32  . Dyslipidemia   . History of stroke   . CAD (coronary artery disease)     cath in 1/08: EF 60%, mild dilated Ao root, oD2 30%, LAD 20-30%.  . Paroxysmal atrial fibrillation ([place])   . Anemia, iron deficiency 08/15/2011  . History of pneumonia 08/15/2011  . History of MI (myocardial infarction) 08/15/2011  . Dilated aortic root ([place]) 08/15/2011  . Stroke ([place]) 10/2011  . Myocardial infarction ([place])   . Hyperlipidemia   . Shortness of breath dyspnea   . Prediabetes 10/08/2015  . Preoperative cardiovascular examination 10/09/2015   Past Surgical History:  Past Surgical History  Procedure Laterality Date  . Cardiac catheterization    . Endarterectomy Right 10/10/2015    Procedure: ENDARTERECTOMY CAROTID-RIGHT;  Surgeon: Serafina Mitchell, MD;  [facility];  Service: Vascular;  Laterality: Right;    Assessment & Plan Clinical Impression: Patient is a 75 y.o. year old male, right handed, with history of hypertension, hyperlipidemia, coronary artery disease maintained on aspirin 81 mg daily, PAF. Patient lives alone and independent prior to admission and driving but he does have a significant other that helps him when needed but cannot provide 24-hour assistance. One level home with 3 steps to entry. He has a brother in [place] but again cannot provide 24-hour care. Presented 10/07/2015 with left-sided  weakness. Blood pressure 220/100. Troponin 0.02. MRI of the brain showed patchy acute ischemic nonhemorrhagic infarct involving the cortical gray matter and deep white matter of the right frontal parietal and occipital lobes. Echocardiogram with ejection fraction of 60%. Normal systolic function. Patient did not receive TPA. CTA of head and  neck showed stenosis at the right ICA origin related to low density plaque or thrombus. Superimposed severe intracranial atherosclerotic type changes including high-grade distal right ICA stenosis. Cerebral angiogram showed preocclusive stenosis right ICA proximal. 80% percent stenosis right ICA supraclinoid segment 50% cavernous segment. Vascular surgery consult that underwent right carotid endarterectomy 10/10/2015 per Dr. Trula Slade. Neurology consulted And maintained on Eliquis and low-dose aspirin for CVA prophylaxis. Tolerating a regular consistency diet. Physical and occupational therapy evaluation completed 10/11/2015 with recommendations of physical medicine and rehabilitation consult.Patient was admitted for comprehensive rehabilitation program.  Patient transferred to CIR on 10/14/2015 .   Patient currently requires min with mobility secondary to muscle weakness and decreased coordination.  Prior to hospitalization, patient was independent  with mobility and lived with Alone in a House home.  Home access is  (4 steps with L ascending rail in front, 6 steps with bilateral railings at back entrance)Stairs to enter.  Patient will benefit from skilled PT intervention to maximize safe functional mobility and minimize fall risk for planned discharge home alone.  Anticipate patient will benefit from follow up [facility] at discharge.  PT - End of Session Activity Tolerance: Tolerates 30+ min activity with multiple rests Endurance Deficit: Yes Endurance Deficit Description: 2/2 fatigue PT Assessment Rehab Potential (ACUTE/IP ONLY): Good Barriers to Discharge: Decreased caregiver support Barriers to Discharge Comments: lives alone PT Patient demonstrates impairments in the following area(s): Balance;Safety;Sensory;Endurance PT Transfers Functional Problem(s): Bed Mobility;Bed to Chair;Car;Furniture;Floor PT Locomotion Functional Problem(s): Ambulation;Stairs PT Plan PT Intensity: Minimum of 1-2 x/day ,45  to 90 minutes PT Frequency: 5 out of 7 days PT Duration Estimated Length of Stay: 7 days PT Treatment/Interventions: Ambulation/gait training;Balance/vestibular training;Neuromuscular re-education;Patient/family education;Stair training;Therapeutic Exercise;UE/LE Coordination activities;UE/LE Strength taining/ROM;Therapeutic Activities;Splinting/orthotics;Functional mobility training;DME/adaptive equipment instruction;Discharge planning PT Transfers Anticipated Outcome(s): Mod I PT Locomotion Anticipated Outcome(s): Mod I with LRAD PT Recommendation Follow Up Recommendations: Home health PT Patient destination: Home Equipment Recommended: Rolling walker with 5" wheels  Skilled Therapeutic Intervention Pt received in w/c & agreeable to PT. MMT & other manual testing completed; please see below for further details. Pt reports he has no DME at home & has had no falls in past year, but does occasionally furniture walk. Pt's goal is to "get back to normal". Pt completed bed mobility tasks with supervision and bed rails. Pt able to transfer sit<>stand and stand>pivot with CGA & RW. Pt ambulated 100 ft with supervision and RW room>gym and transferred in/out of car with CGA/supervision (26.5 inch seat height). Pt reports his LLE buckles but none was noted during session. Pt able to negotiate 4 steps x 3 trials consecutively with B rails and CGA. PT educated pt on need to ascend steps leading with RLE & descend leading with LLE; pt required tactile cuing to demonstrate pattern. Pt ambulated over uneven surface with RW & supervision assistance, and completed TUG in 39 seconds with RW. Pt completed 5x sit<>stand in 20 seconds, and requested to complete task a second time & did so in 14 seconds. Pt appears to be emotionally labile as he became tearful observing another pt in gym. Pt ambulated 160 ft back to room with RW & supervision, and left in recliner with  QRB in place at end of session.   PT  Evaluation Precautions/Restrictions Precautions Precautions: Fall Restrictions Weight Bearing Restrictions: No General Chart Reviewed: Yes Response to Previous Treatment: Patient with no complaints from previous session. Family/Caregiver Present: No Vital SignsTherapy Vitals Pulse Rate: 86 BP: 127/75 mmHg Patient Position (if appropriate): Sitting Oxygen Therapy SpO2: 100 % O2 Device: room air Pulse Oximetry Type: Intermittent Pain Pain Assessment Pain Assessment: No/denies pain Home Living/Prior Functioning Home Living Living Arrangements: Alone Available Help at Discharge: Family;Available PRN/intermittently Type of Home: House Home Access: Stairs to enter [employer] of Steps:  (4 steps with L ascending rail in front, 6 steps with bilateral railings at back entrance) Home Layout: One level  Lives With: Alone Prior Function Level of Independence: Independent with basic ADLs;Independent with homemaking with ambulation;Independent with gait;Independent with transfers (without AD)  Able to Take Stairs?: Reciprically Driving: Yes Vocation: Retired Vision/Perception    Wears glasses all the time Cognition Overall Cognitive Status: Within Functional Limits for tasks assessed Orientation Level: Oriented to place;Oriented to person;Oriented to time;Oriented to situation (oriented to month only) Sensation Sensation Light Touch: Appears Intact (BLE sensation equal & intact to light touch when tested, but pt reported LLE numbness distal to knee during session) Proprioception: Appears Intact Coordination Heel Shin Test:  (slightly impaired LLE) Motor  Motor Motor - Skilled Clinical Observations: negative clonus BLE  Mobility Bed Mobility Bed Mobility: Rolling Right;Rolling Left;Supine to Sit;Sit to Supine Rolling Right: 5: Supervision (with bed rails) Rolling Left: 5: Supervision (with bed rails) Supine to Sit: 5: Supervision (with bed rails) Sit to Supine: 5:  Supervision (with bed rails) Transfers Transfers: Yes Sit to Stand: 4: Min guard Sit to Stand Details: Verbal cues for sequencing;Verbal cues for technique Sit to Stand Details (indicate cue type and reason): with RW Stand to Sit: 5: Supervision Stand to Sit Details (indicate cue type and reason): Verbal cues for sequencing Stand Pivot Transfers: 4: Min guard (with RW) Locomotion  Ambulation Ambulation: Yes Ambulation/Gait Assistance: 5: Supervision Ambulation Distance (Feet): 160 Feet Assistive device: Standard walker Gait Gait: Yes Gait Pattern: Decreased hip/knee flexion - left Stairs / Additional Locomotion Stairs: Yes Stairs Assistance: 4: Min guard Stairs Assistance Details: Verbal cues for sequencing Stair Management Technique: Two rails Number of Stairs: 12 Height of Stairs: 6 Wheelchair Mobility Wheelchair Mobility: No   Balance Standardized Balance Assessment Standardized Balance Assessment: Timed Up and Go Test Timed Up and Go Test TUG: Normal TUG Normal TUG (seconds): 39 Extremity Assessment      RLE Assessment RLE Assessment: Within Functional Limits (grossly 4/5) LLE Assessment LLE Assessment: Within Functional Limits (grossly 4/5)   See Function Navigator for Current Functional Status.   Refer to Care Plan for Long Term Goals  Recommendations for other services: None  Discharge Criteria: Patient will be discharged from PT if patient refuses treatment 3 consecutive times without medical reason, if treatment goals not met, if there is a change in medical status, if patient makes no progress towards goals or if patient is discharged from hospital.  The above assessment, treatment plan, treatment alternatives and goals were discussed and mutually agreed upon: by patient  [name] 10/15/2015, 5:53 PM

## 2015-10-15 NOTE — Progress Notes (Signed)
Social Work Patient ID: [name], male   DOB: March 14, 1941, 75 y.o.   MRN: [id number] [name] over team conference goals-mod/i level and target discharge date 3/20 after am therapies. Pt reports he likes it here and wants to stay. It seems pt like the attention and the fact he has many people to talk too and teel his stories too. Will try to confirm if [name] can check on pt once home, he is  Not sure she will come by every so often, according to him. Will work on a safe didscharge date, foley being removed today.

## 2015-10-15 NOTE — Care Management Note (Signed)
[facility] Individual Statement of Services  Patient Name:  [name]  Date:  10/15/2015  Welcome to the [facility].  Our goal is to provide you with an individualized program based on your diagnosis and situation, designed to meet your specific needs.  With this comprehensive rehabilitation program, you will be expected to participate in at least 3 hours of rehabilitation therapies Monday-Friday, with modified therapy programming on the weekends.  Your rehabilitation program will include the following services:  Physical Therapy (PT), Occupational Therapy (OT), 24 hour per day rehabilitation nursing, Therapeutic Recreaction (TR), Case Management (Social Worker), Rehabilitation Medicine, Nutrition Services and Pharmacy Services  Weekly team conferences will be held on Wednesday to discuss your progress.  Your Social Worker will talk with you frequently to get your input and to update you on team discussions.  Team conferences with you and your family in attendance may also be held.  Expected length of stay: 7 days  Overall anticipated outcome: mod/i level  Depending on your progress and recovery, your program may change. Your Social Worker will coordinate services and will keep you informed of any changes. Your Social Worker's name and contact numbers are listed  below.  The following services may also be recommended but are not provided by the [facility] will be made to provide these services after discharge if needed.  Arrangements include referral to agencies that provide these services.  Your insurance has been verified to be:  UHC-Medicare Your primary doctor is:  Cathlean Cower  Pertinent information will be shared with your doctor and your insurance company.  Social Worker:  Ovidio Kin, [place]  or ([phone]  Information discussed with and copy given to patient by: Elease Hashimoto, 10/15/2015, 1:54 PM

## 2015-10-15 NOTE — Progress Notes (Addendum)
Physical Therapy Note  Patient Details  Name: [name] MRN: [id number] Date of Birth: 05-25-1941 Today's Date: 10/15/2015    Time: 1130-1155 25 minutes  1:1 No c/o pain. Gait with RW 2 x 100' with min A, slow cadence, widened BOS, cues for attention to task as pt is hyperverbal. Gait without AD 2 x 30' with min A, wide BOS, slightly increased lateral sway.  Pt performed furniture transfers from couch and recliner with supervision, cues for hand placement.  Pt with good motivation to improve.  Time 2: 1300-1327 27 minutes  1:1 No c/o pain. Gait with RW 150' x 2 with close supervision, min A with turns, cues for safety with obstacle negotiation. Standing balance tasks with tapping, side stepping, picking object up from floor, wt shifts and head turns all min A for balance. Pt with  LOB with SLS activities, steadying assist needed for sit to stand trials without UE support.   DONAWERTH,KAREN 10/15/2015, 11:54 AM

## 2015-10-15 NOTE — Care Management Note (Signed)
Case Management Note  Patient Details  Name: [name] MRN: [id number] Date of Birth: 27-Jul-1941  Subjective/Objective:    Patient discharged to [facility] on 3/13.                Action/Plan:   Expected Discharge Date:  10/10/15               Expected Discharge Plan:  [place]  In-House Referral:     Discharge planning Services  CM Consult  Post Acute Care Choice:    Choice offered to:     DME Arranged:    DME Agency:     HH Arranged:    HH Agency:     Status of Service:  Completed, signed off  Medicare Important Message Given:  Yes Date Medicare IM Given:    Medicare IM give by:    Date Additional Medicare IM Given:    Additional Medicare Important Message give by:     If discussed at [place] of Stay Meetings, dates discussed:    Additional Comments:  Zenon Mayo, RN 10/15/2015, 2:19 PM

## 2015-10-16 ENCOUNTER — Inpatient Hospital Stay (HOSPITAL_COMMUNITY): Payer: Medicare Other | Admitting: Physical Therapy

## 2015-10-16 ENCOUNTER — Inpatient Hospital Stay (HOSPITAL_COMMUNITY): Payer: Medicare Other | Admitting: Occupational Therapy

## 2015-10-16 DIAGNOSIS — I10 Essential (primary) hypertension: Secondary | ICD-10-CM | POA: Insufficient documentation

## 2015-10-16 DIAGNOSIS — R339 Retention of urine, unspecified: Secondary | ICD-10-CM

## 2015-10-16 MED ORDER — CARVEDILOL 6.25 MG PO TABS
6.2500 mg | ORAL_TABLET | Freq: Two times a day (BID) | ORAL | Status: DC
  Administered 2015-10-16 – 2015-10-21 (×10): 6.25 mg via ORAL
  Filled 2015-10-16 (×10): qty 1

## 2015-10-16 NOTE — Progress Notes (Signed)
Physical Therapy Session Note  Patient Details  Name: [name] MRN: [id number] Date of Birth: Apr 09, 1941  Today's Date: 10/16/2015 PT Individual Time: 1000-1115 PT Individual Time Calculation (min): 75 min   Short Term Goals: Week 1:   STG = LTG due to short ELOS.  Skilled Therapeutic Interventions/Progress Updates:    Pt received in recliner & agreeable to PT; pt reported his LE's feel as if they "have water in them, they're heavy". Pt ambulated room>gym with RW & supervision. Pt completed Berg balance test & scored 41/56. Patient demonstrates increased fall risk as noted by score of 41/56 on Berg Balance Scale.  (<36= high risk for falls, close to 100%; 37-45 significant >80%; 46-51 moderate >50%; 52-55 lower >25%). Pt educated on interpretation of score. Pt attempted ambulating with SPC x 200 ft, CGA, & had difficulty sequencing gait pattern. When pt asked to increase ambulation speed pt would demonstrate more reciprocal gait pattern but could not maintain this. PT also provided frequent cuing to look up & scan environment, as pt tends to look at floor. Pt ambulated without an AD with CGA, increased lateral sway, and short shuffled steps. Pt reported he prefers to ambulate with RW as he feels more steady. Pt completed standing balance task on airex foam requiring him to reach outside of his BOS with LUE to obtain then toss horseshoes. Pt ambulated without an AD to collect horseshoes and able to increase step length and gait speed with cuing from PT. Pt utilized nu-step, Level 1 x 1 minute + Level 5 x 6 minutes, completing a total of 441 steps & reporting 13 on Borg RPE scale. At end of session pt ambulated back to room & left in recliner with QRB in place & all needs within reach.  Therapy Documentation Precautions:  Precautions Precautions: Fall Restrictions Weight Bearing Restrictions: No  Pain: Pain Assessment Pain Assessment: No/denies pain Pain Score: 0-No pain      Balance: Balance Balance Assessed: Yes Standardized Balance Assessment Standardized Balance Assessment: Berg Balance Test Berg Balance Test Sit to Stand: Able to stand without using hands and stabilize independently Standing Unsupported: Able to stand safely 2 minutes Sitting with Back Unsupported but Feet Supported on Floor or Stool: Able to sit safely and securely 2 minutes Stand to Sit: Sits safely with minimal use of hands Transfers: Able to transfer safely, definite need of hands Standing Unsupported with Eyes Closed: Able to stand 10 seconds safely Standing Ubsupported with Feet Together: Able to place feet together independently but unable to hold for 30 seconds From Standing, Reach Forward with Outstretched Arm: Can reach forward >12 cm safely (5") From Standing Position, Pick up Object from Floor: Able to pick up shoe safely and easily From Standing Position, Turn to Look Behind Over each Shoulder: Looks behind from both sides and weight shifts well Turn 360 Degrees: Needs close supervision or verbal cueing Standing Unsupported, Alternately Place Feet on Step/Stool: Able to complete 4 steps without aid or supervision Standing Unsupported, One Foot in Front: Needs help to step but can hold 15 seconds Standing on One Leg: Tries to lift leg/unable to hold 3 seconds but remains standing independently Total Score: 41  See Function Navigator for Current Functional Status.   Therapy/Group: Individual Therapy  [name] 10/16/2015, 5:11 PM

## 2015-10-16 NOTE — Plan of Care (Signed)
Problem: RH BLADDER ELIMINATION Goal: RH STG MANAGE BLADDER WITH ASSISTANCE STG Manage Bladder With Mod Assistance  Outcome: Progressing Condom cath in use     Goal: RH STG MANAGE BLADDER WITH EQUIPMENT WITH ASSISTANCE STG Manage Bladder With Equipment With Mod Assistance  Outcome: Progressing Condom cath in use      Problem: RH PAIN MANAGEMENT Goal: RH STG PAIN MANAGED AT OR BELOW PT'S PAIN GOAL <4  Outcome: Progressing No complaints of pain

## 2015-10-16 NOTE — Progress Notes (Signed)
Physical Therapy Session Note  Patient Details  Name: [name] MRN: [id number] Date of Birth: 09/27/1940  Today's Date: 10/16/2015 PT Individual Time: 1302-1401 PT Individual Time Calculation (min): 59 min   Short Term Goals: Week 1:    STG = LTG d/t short length of stay.   Skilled Therapeutic Interventions/Progress Updates:    Pt received sitting in recliner with Lunch tray. Patient instructed in Gait training with RW to rehab gym, 120ft, with supervision A and RW. Standing balance training with toe taps on 6 inch cone with BUE. Toe taps on 6 inch cone with 1 UE support. Semi-tandem stance without UE Support up 25 seconds PT provided cues to improve use of hip strategy to prevent LOB. Gait training for 124ft, RW, Supervision A and min cues for AD management. Patient instructed in dynamic balance training with forward and lateral steps to dyna disk with occational toe taps on 6 inch cone. Static standing balance on airex pad while completing pipe tower 2x 5 minutes. Patient required moderate assist for problem solving strategies to improve success with pipe tower. Gait training to return to room, 137ft, supervision A and cues to maintain BOS within RW. Patient left supine in bed with bed alarm set and cal bell within reach.   Therapy Documentation Precautions:  Precautions Precautions: Fall Restrictions Weight Bearing Restrictions: No General:   Vital Signs: Therapy Vitals Temp: 98.2 F (36.8 C) Temp Source: Oral Pulse Rate: 84 Resp: 18 BP: (!) 174/77 mmHg Patient Position (if appropriate): Lying Oxygen Therapy SpO2: 97 % O2 Device: Not Delivered Pain: Pain Assessment Pain Assessment: No/denies pain Exercises:     See Function Navigator for Current Functional Status.   Therapy/Group: Individual Therapy  Lorie Phenix 10/16/2015, 5:40 PM

## 2015-10-16 NOTE — Progress Notes (Signed)
Occupational Therapy Session Note  Patient Details  Name: [name] MRN: [id number] Date of Birth: [birth year]  Today's Date: 10/16/2015 OT Individual Time: 0800-0900 OT Individual Time Calculation (min): 60 min    Short Term Goals: Week 1:  OT Short Term Goal 1 (Week 1): STG=LTG d/t short LOS  Skilled Therapeutic Interventions/Progress Updates:    1:1 self care retraining at shower level.  Functional ambulation around room at beginning of session with RW with steadying. However pt with heavy reliance on RW with UB and with forward flexion.  Towards end of session perform functional ambulation without RW to promote upright posture and less reliance on UB and focus on LEs- pt required min A. Sit to stand and standing balance during showering with supervision for safety.  Participated in grooming at sink sit to stand.  A with shaving due to incision.   Therapy Documentation Precautions:  Precautions Precautions: Fall Restrictions Weight Bearing Restrictions: No   Pain: Pain Assessment Pain Assessment: No/denies pain ADL: ADL ADL Comments: see Functional Assessment Tool  See Function Navigator for Current Functional Status.   Therapy/Group: Individual Therapy  Willeen Cass [facility] 10/16/2015, 11:12 AM

## 2015-10-16 NOTE — Progress Notes (Signed)
[place] PHYSICAL MEDICINE & REHABILITATION     PROGRESS NOTE  Subjective/Complaints:  Patient seen sitting up in bed this morning eating breakfast. He is noted to be chewing all of the food on the left side of his mouth. When asked about this, he states he always does this secondary to poor dentition. He also states that he had a "great" first day of therapies.   ROS: Denies CP, SOB, N/V/D.  Objective: Vital Signs: Blood pressure 160/83, pulse 76, temperature 98.6 F (37 C), temperature source Oral, resp. rate 18, height 5' 6.5" (1.689 m), weight 74.3 kg (163 lb 12.8 oz), SpO2 100 %. No results found.  Recent Labs  10/15/15 0524  WBC 6.3  HGB 9.5*  HCT 31.4*  PLT 190    Recent Labs  10/14/15 0808 10/15/15 0524  NA  --  138  K 3.5 3.4*  CL  --  104  GLUCOSE  --  96  BUN  --  10  CREATININE  --  0.90  CALCIUM  --  8.5*   CBG (last 3)  No results for input(s): GLUCAP in the last 72 hours.  Wt Readings from Last 3 Encounters:  10/16/15 74.3 kg (163 lb 12.8 oz)  10/10/15 78.291 kg (172 lb 9.6 oz)  06/21/15 76.522 kg (168 lb 11.2 oz)    Physical Exam:  BP 160/83 mmHg  Pulse 76  Temp(Src) 98.6 F (37 C) (Oral)  Resp 18  Ht 5' 6.5" (1.689 m)  Wt 74.3 kg (163 lb 12.8 oz)  BMI 26.05 kg/m2  SpO2 100% Constitutional: He appears well-developed and well-nourished.  HENT: Normocephalic and atraumatic.  Eyes: Conjunctivae and EOM are normal.  Cardiovascular: Normal rate and regular rhythm.  Respiratory: Effort normal and breath sounds normal. No respiratory distress.  GI: Soft. Bowel sounds are normal. He exhibits no distension.  Musculoskeletal: He exhibits no edema or tenderness.  Neurological: He is alert.  Mood is a bit flat but appropriate.  He does have awareness of his deficits Motor: b/l UE 4+/5, B/l LE 5/5 (right slightly stronger than left)  Mild ataxia LUE>RUE  Skin: Skin is warm and dry.  Psychiatric: He has a normal mood and affect. His  behavior is normal  Assessment/Plan: 1. Functional deficits secondary to right parietal occipital lobe infarct which require 3+ hours per day of interdisciplinary therapy in a comprehensive inpatient rehab setting. Physiatrist is providing close team supervision and 24 hour management of active medical problems listed below. Physiatrist and rehab team continue to assess barriers to discharge/monitor patient progress toward functional and medical goals.  Function:  Bathing Bathing position      Bathing parts      Bathing assist        Upper Body Dressing/Undressing Upper body dressing                    Upper body assist        Lower Body Dressing/Undressing Lower body dressing                                  Lower body assist        Toileting Toileting   Toileting steps completed by patient: Adjust clothing prior to toileting, Performs perineal hygiene, Adjust clothing after toileting      Toileting assist     Transfers Chair/bed transfer   Chair/bed transfer method: Stand pivot Chair/bed transfer assist  level: Touching or steadying assistance (Pt > 75%) Chair/bed transfer assistive device: Medical sales representative     Max distance: 160 Assist level: Supervision or verbal cues   Wheelchair Wheelchair activity did not occur: N/A        Cognition Comprehension Comprehension assist level: Understands basic 90% of the time/cues < 10% of the time  Expression Expression assist level: Expresses basic 90% of the time/requires cueing < 10% of the time.  Social Interaction Social Interaction assist level: Interacts appropriately 90% of the time - Needs monitoring or encouragement for participation or interaction.  Problem Solving Problem solving assist level: Solves basic 75 - 89% of the time/requires cueing 10 - 24% of the time  Memory Memory assist level: Recognizes or recalls 90% of the time/requires cueing < 10% of the time     Medical Problem List and Plan: 1. Left sided weakness secondary to right parietal occipital lobe infarct on 3/6  Cont CIR 2. DVT Prophylaxis/Anticoagulation: Eliquis 3. Pain Management: Oxycodone as needed 4. Symptomatic right carotid stenosis. 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 Follow-up vascular surgery as needed 5. Neuropsych: This patient is capable of making decisions on his own behalf. 6. Skin/Wound Care: Routine skin checks 7. Fluids/Electrolytes/Nutrition: Routine I&O   Hypokalemia 3.4 on 3/14, will replete and continue to monitor  8. Hypertension/PAF. Norvasc 10 mg daily, Lasix 20 mg daily.   Coreg 3.125 mg twice a day, increased to 6.25 on 3/15   Monitor with increased mobility 9. Coronary artery disease. No chest pain or shortness of breath. Patient currently remains on low dose aspirin 10. Hyperlipidemia. Lipitor/zetia 11. BPH. Flomax 0.4 mg daily. PVRs appear to be improving 12. ABLA  Hb 9.5 on 3/14  Will cont to monitor  LOS (Days) 2 A FACE TO FACE EVALUATION WAS PERFORMED  Rhealynn Myhre Lorie Phenix 10/16/2015 8:10 AM

## 2015-10-17 ENCOUNTER — Inpatient Hospital Stay (HOSPITAL_COMMUNITY): Payer: Medicare Other

## 2015-10-17 ENCOUNTER — Inpatient Hospital Stay (HOSPITAL_COMMUNITY): Payer: Medicare Other | Admitting: Physical Therapy

## 2015-10-17 DIAGNOSIS — R0989 Other specified symptoms and signs involving the circulatory and respiratory systems: Secondary | ICD-10-CM | POA: Insufficient documentation

## 2015-10-17 DIAGNOSIS — R030 Elevated blood-pressure reading, without diagnosis of hypertension: Secondary | ICD-10-CM

## 2015-10-17 MED ORDER — HYDRALAZINE HCL 10 MG PO TABS: 10.0000 mg | ORAL_TABLET | Freq: Three times a day (TID) | ORAL | Status: DC | PRN

## 2015-10-17 NOTE — Progress Notes (Signed)
Occupational Therapy Session Note  Patient Details  Name: [name] MRN: [id number] Date of Birth: 29-Nov-1940  Today's Date: 10/17/2015 OT Individual Time: 0845-1000 OT Individual Time Calculation (min): 75 min   Short Term Goals: Week 1:  OT Short Term Goal 1 (Week 1): STG=LTG d/t short LOS  Skilled Therapeutic Interventions/Progress Updates: ADL-retraining with focus on improved safety awareness and reinforcement training on risk of fall d/t impaired standing balance.   Pt received asleep in bed in darkened room with breakfast meal consumed prior to visit.   Pt able to rise and ambulate to bathroom with standby assist, complete bathing sitting and standing with mod vc to reinforce need for grab bar to maintain standing balance, and copious cues to sit to reduce fall risk.   Pt hears and acknowledges warnings and directions but ignores them during activity for unknown reason.  Pt demo'd loss of balance 2 times while bathing and 1 time while dressing, standing on one leg to pull up underwear and pants.   Pt requires close supervision during BADL and moderate redirection to attend to discussion and observe precautions provided.    Pt requires moderate assist only to shave d/t presence of wound on right lower jaw and neck.     Therapy Documentation Precautions:  Precautions Precautions: Fall Restrictions Weight Bearing Restrictions: No  Vital Signs:     Pain: No/denies pain   ADL: ADL ADL Comments: see Functional Assessment Tool  See Function Navigator for Current Functional Status.   Therapy/Group: Individual Therapy   Second session: Time: 1300-1400 Time Calculation (min):  60 min  Pain Assessment: No/denies pain  Skilled Therapeutic Interventions: Therapeutic activity with focus on improved awareness, functional endurance, dynamic standing balance, and AE to enhance self-care.    Pt received seated in recliner finishing his lunch.   Pt agreeable to defer continued  self-feeding to engage in session.   Pt alerted OT to need to toilet and was assisted to toilet w/o gait aid.   Pt completed BM and all toileting unassisted although requesting condom catheter use d/t previous history of incontinence (prior to CVA).   Pt educated on donning condom cath and he completed this with assist to install bag only.  Pt was then escorted to gym to engage in dynamic balance assessment using Nintendo Wii and Wii Fit program with balance board.  Pt demo'd unbalanced standing balance with 61% weight shift to right and posterior.   OT educated pt on principles of weight-shifting as pt participated in 5 activities (games) to promote left lateral leaning and improved postural control.   Pt returned to his room at end of session with standby assist during functional mobility using RW.   See FIM for current functional status  Therapy/Group: Individual Therapy  Salome Spotted 10/17/2015, 2:28 PM

## 2015-10-17 NOTE — Plan of Care (Signed)
Problem: RH PAIN MANAGEMENT Goal: RH STG PAIN MANAGED AT OR BELOW PT'S PAIN GOAL <4  Outcome: Progressing No complaints of pain

## 2015-10-17 NOTE — Progress Notes (Signed)
Physical Therapy Session Note  Patient Details  Name: [name] MRN: [id number] Date of Birth: 04-21-1941  Today's Date: 10/17/2015 PT Group Time: [id number] PT Group Time Calculation (min): 61 min  Therapy Documentation Precautions:  Precautions Precautions: Fall Restrictions Weight Bearing Restrictions: No Pain: Pain Assessment Pain Assessment: No/denies pain   Patient participated in group therapy session with emphasis on functional mobility and endurance.   Patient ambulated 250 feet close supervision with RW. Patient ambulated with a step through gait pattern. Patient demonstrated poor foot clearance on left and forward flexed posture. Verbal cues for RW management, negotiation of obstacles and environmental awareness, and cues for left foot clearance.   Patient up and down 12 steps with right handrail close supervision with one episode of min assist secondary to poor foot placement on step. Patient educated on foot placement sequencing and pacing. Patient easily distraction to external environment.   Standing there ex: B heel raises 3x10 CGA Mini squats 3x10 CGA  Nu-Step 10 minutes level 6  Patient performed backwards walking 25 feetx3 with min assist in order to promote hip extension and improve dynamic balance.  Patient performed ball with min to mod excursion with close supervision and multiple LOB requiring min assist to recover. Patient able to retrieve ball from floor with close supervision.   Patient returned to room at end of session without any complaints by rehab tech Kelvin. Patient educated not to be up without assistance and patient in agreement with recommendation and verbalized understanding.    See Function Navigator for Current Functional Status.   Therapy/Group: Group Therapy  Retta Diones 10/17/2015, 12:16 PM

## 2015-10-17 NOTE — Progress Notes (Addendum)
[facility]     PROGRESS NOTE  Subjective/Complaints:  Pt seen this AM sitting up in his bed eating breakfast.  He slept well overnight, but states his RLE feels a little different this AM.  He inquires about his discharge date.    ROS: Denies CP, SOB, N/V/D.  Objective: Vital Signs: Blood pressure 139/62, pulse 78, temperature 99.2 F (37.3 C), temperature source Oral, resp. rate 18, height 5' 6.5" (1.689 m), weight 74.3 kg (163 lb 12.8 oz), SpO2 97 %. No results found.  Recent Labs  10/15/15 0524  WBC 6.3  HGB 9.5*  HCT 31.4*  PLT 190    Recent Labs  10/15/15 0524  NA 138  K 3.4*  CL 104  GLUCOSE 96  BUN 10  CREATININE 0.90  CALCIUM 8.5*   CBG (last 3)  No results for input(s): GLUCAP in the last 72 hours.  Wt Readings from Last 3 Encounters:  10/16/15 74.3 kg (163 lb 12.8 oz)  10/10/15 78.291 kg (172 lb 9.6 oz)  06/21/15 76.522 kg (168 lb 11.2 oz)    Physical Exam:  BP 139/62 mmHg  Pulse 78  Temp(Src) 99.2 F (37.3 C) (Oral)  Resp 18  Ht 5' 6.5" (1.689 m)  Wt 74.3 kg (163 lb 12.8 oz)  BMI 26.05 kg/m2  SpO2 97% Constitutional: He appears well-developed and well-nourished.  HENT: Normocephalic and atraumatic.  Eyes: Conjunctivae and EOM are normal.  Cardiovascular: Normal rate and regular rhythm.  Respiratory: Effort normal and breath sounds normal. No respiratory distress. GI: Soft. Bowel sounds are normal. He exhibits no distension.  Musculoskeletal: He exhibits no edema or tenderness.  Neurological: He is alert.  Mood is flat but appropriate.  He does have awareness of his deficits Motor: b/l UE 4+/5, B/l LE 5/5 (right slightly stronger than left)  Mild ataxia LUE>RUE  Skin: Skin is warm and dry.  Psychiatric: He has a normal mood and affect. His behavior is normal  Assessment/Plan: 1. Functional deficits secondary to right parietal occipital lobe infarct which require 3+ hours per day of  interdisciplinary therapy in a comprehensive inpatient rehab setting. Physiatrist is providing close team supervision and 24 hour management of active medical problems listed below. Physiatrist and rehab team continue to assess barriers to discharge/monitor patient progress toward functional and medical goals.  Function:  Bathing Bathing position   Position: Shower  Bathing parts Body parts bathed by patient: Right arm, Left arm, Chest, Abdomen, Front perineal area, Buttocks, Right upper leg, Left upper leg, Right lower leg, Left lower leg Body parts bathed by helper: Back  Bathing assist Assist Level: Supervision or verbal cues      Upper Body Dressing/Undressing Upper body dressing   What is the patient wearing?: Hospital gown                Upper body assist Assist Level: Touching or steadying assistance(Pt > 75%) (had to tie it in the back at neck and back)      Lower Body Dressing/Undressing Lower body dressing   What is the patient wearing?: Underwear, Pants, Socks, [employer]  - Performed by patient: Thread/unthread right underwear leg, Thread/unthread left underwear leg, Pull underwear up/down   Pants- Performed by patient: Thread/unthread right pants leg, Thread/unthread left pants leg, Pull pants up/down, Fasten/unfasten pants (with belt)       Socks - Performed by patient: Don/doff right sock, Don/doff left sock  TED Hose - Performed by helper: Don/doff right TED hose, Don/doff left TED hose  Lower body assist Assist for lower body dressing: Supervision or verbal cues, Set up   Set up : Don/doff TED stockings, To obtain clothing/put away  Toileting Toileting   Toileting steps completed by patient: Adjust clothing prior to toileting, Performs perineal hygiene, Adjust clothing after toileting      Toileting assist     Transfers Chair/bed transfer   Chair/bed transfer method: Ambulatory Chair/bed transfer assist level: Supervision or  verbal cues Chair/bed transfer assistive device: Armrests, Medical sales representative     Max distance: 150 Assist level: Supervision or verbal cues   Wheelchair Wheelchair activity did not occur: N/A        Cognition Comprehension Comprehension assist level: Understands basic 90% of the time/cues < 10% of the time  Expression Expression assist level: Expresses basic 90% of the time/requires cueing < 10% of the time.  Social Interaction Social Interaction assist level: Interacts appropriately 90% of the time - Needs monitoring or encouragement for participation or interaction.  Problem Solving Problem solving assist level: Solves basic 75 - 89% of the time/requires cueing 10 - 24% of the time  Memory Memory assist level: Recognizes or recalls 90% of the time/requires cueing < 10% of the time    Medical Problem List and Plan: 1. Left sided weakness secondary to right parietal occipital lobe infarct on 3/6  Cont CIR 2. DVT Prophylaxis/Anticoagulation: Eliquis 3. Pain Management: Oxycodone as needed 4. Symptomatic right carotid stenosis. 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 Follow-up vascular surgery as needed 5. Neuropsych: This patient is capable of making decisions on his own behalf. 6. Skin/Wound Care: Routine skin checks 7. Fluids/Electrolytes/Nutrition: Routine I&O   Hypokalemia 3.4 on 3/14, will replete and continue to monitor  8. Hypertension/PAF. Norvasc 10 mg daily, Lasix 20 mg daily.   Coreg 3.125 mg twice a day, increased to 6.25 on 3/15  Labile at present, will add PRN hydralizine  Will cont to monitor with increased mobility 9. Coronary artery disease. No chest pain or shortness of breath. Patient currently remains on low dose aspirin 10. Hyperlipidemia. Lipitor/zetia 11. BPH. Flomax 0.4 mg daily.  12. ABLA  Hb 9.5 on 3/14  Will cont to monitor  LOS (Days) 3 A FACE TO FACE EVALUATION WAS PERFORMED  Less Woolsey Lorie Phenix 10/17/2015 8:21 AM

## 2015-10-18 ENCOUNTER — Inpatient Hospital Stay (HOSPITAL_COMMUNITY): Payer: Medicare Other | Admitting: Physical Therapy

## 2015-10-18 ENCOUNTER — Inpatient Hospital Stay (HOSPITAL_COMMUNITY): Payer: Medicare Other | Admitting: Occupational Therapy

## 2015-10-18 ENCOUNTER — Inpatient Hospital Stay (HOSPITAL_COMMUNITY): Payer: Medicare Other

## 2015-10-18 DIAGNOSIS — I63331 Cerebral infarction due to thrombosis of right posterior cerebral artery: Secondary | ICD-10-CM | POA: Insufficient documentation

## 2015-10-18 MED ORDER — TAMSULOSIN HCL 0.4 MG PO CAPS
0.8000 mg | ORAL_CAPSULE | Freq: Every day | ORAL | Status: DC
  Administered 2015-10-18 – 2015-10-20 (×3): 0.8 mg via ORAL
  Filled 2015-10-18 (×3): qty 2

## 2015-10-18 NOTE — Progress Notes (Addendum)
Occupational Therapy Session Note  Patient Details  Name: [name] MRN: [id number] Date of Birth: 09/22/1940  Today's Date: 10/18/2015 OT Individual Time:  -   0800-0900  (60 min)      Short Term Goals: Week 1:  OT Short Term Goal 1 (Week 1): STG=LTG d/t short LOS      Skilled Therapeutic Interventions/Progress Updates:  .  Pt asleep upon OT arrival.  Easily aroused.  Pt wanted to eat breakfast before shower.  Pt went from supine to sit with excellent control and strength using bed rails.  Sat EOB and ate breakfast.  Ambulated to bathroom and transferred to toilet>tub transfer bench with close supervision.  Pt bathed self with SBA with standing and sitting poistion.    Provided cues for ppt to stand against wall and another surface rather than stand in open areas.  Pt demonstrated decreased auditory comprehension or attention when nurse was providing information as he did not retain or recall any of the information.  Pt. Ambulated back to recliner after dressing and left with all needs in reach.    Therapy Documentation Precautions:  Precautions Precautions: Fall Restrictions Weight Bearing Restrictions: No      Pain:  none   ADL: ADL ADL Comments: see Functional Assessment Tool    See Function Navigator for Current Functional Status.   Therapy/Group: Individual Therapy  Lisa Roca 10/18/2015, 7:54 AM

## 2015-10-18 NOTE — Progress Notes (Signed)
Physical Therapy Note  Patient Details  Name: NAVRAJ SAMPAYO MRN: [id number] Date of Birth: 10/19/1940 Today's Date: 10/18/2015    Time: 1430-1500 30 minutes  1:1 No c/o pain. Gait in controlled environment with RW with supervision, cues for safety with use of RW as pt tends to leave RW aside when performing transfers.  Nustep for UE/LE strength and endurance x 6 minutes level 3.  Standing balance on mini trampoline without UE support for static stance, UE reaching and wt shifts with steadying assist.  1 UE support for marching, mini squats and heel raises with min A for balance. Pt with decreased trunk stability with balance activities without UE support, min A for delayed balance reactions.   Emlyn Maves 10/18/2015, 3:02 PM

## 2015-10-18 NOTE — Progress Notes (Signed)
Social Work Patient ID: [name], male   DOB: 06-26-1941, 75 y.o.   MRN: [id number] Met with pt to discuss discharge plan for Monday, he is feeling like he needs to stay a little longer to work on his leg. He plans to talk with MD regarding this. Informed him he will get follow up therapies and they will continue to work on his leg and arm. He is agreeable to this and has no preference of agencies. Agreeable to rolling walker and tub seat, referral made to [facility] for above equipment. Pt will have intermittent assistance at home. Hopefully will reach his mod/i level Goals since he is going home alone. Will touch base with team regarding this.

## 2015-10-18 NOTE — Progress Notes (Addendum)
[facility]IC[facility] RE[facility]ABILITATION     PROGRESS NOTE  Subjective/Complaints:  Pt working with therapies this AM.   He is progressing well in therapies.  ROS: Denies CP, SOB, N/V/D.  Objective: Vital Signs: Blood pressure 141/78, pulse 73, temperature 98.5 F (36.9 C), temperature source Oral, resp. rate 18, height 5' 6.5" (1.689 m), weight 74.3 kg (163 lb 12.8 oz), SpO2 99 %. No results found. No results for input(s): WBC, HGB, HCT, PLT in the last 72 hours. No results for input(s): NA, K, CL, GLUCOSE, BUN, CREATININE, CALCIUM in the last 72 hours.  Invalid input(s): CO CBG (last 3)  No results for input(s): GLUCAP in the last 72 hours.  Wt Readings from Last 3 Encounters:  10/16/15 74.3 kg (163 lb 12.8 oz)  10/10/15 78.291 kg (172 lb 9.6 oz)  06/21/15 76.522 kg (168 lb 11.2 oz)    Physical Exam:  BP 141/78 mmHg  Pulse 73  Temp(Src) 98.5 F (36.9 C) (Oral)  Resp 18  Ht 5' 6.5" (1.689 m)  Wt 74.3 kg (163 lb 12.8 oz)  BMI 26.05 kg/m2  SpO2 99% Constitutional: He appears well-developed and well-nourished.  HENT: Normocephalic and atraumatic.  Eyes: Sclera slightly icteric and injected. EOM are normal.  Cardiovascular: Normal rate and regular rhythm.  Respiratory: Effort normal and breath sounds normal. No respiratory distress. GI: Soft. Bowel sounds are normal. He exhibits no distension.  Musculoskeletal: He exhibits no edema or tenderness.  Neurological: He is alert.  He Has awareness of his deficits Motor: b/l UE 4+/5, B/l LE 5/5 (right slightly stronger than left)  Mild ataxia LUE>RUE  (improving) Skin: Skin is warm and dry.  Psychiatric: He has a normal mood and affect. His behavior is normal  Assessment/Plan: 1. Functional deficits secondary to right parietal occipital lobe infarct which require 3+ hours per day of interdisciplinary therapy in a comprehensive inpatient rehab setting. Physiatrist is providing close team supervision and 24 hour  management of active medical problems listed below. Physiatrist and rehab team continue to assess barriers to discharge/monitor patient progress toward functional and medical goals.  Function:  Bathing Bathing position   Position: Shower  Bathing parts Body parts bathed by patient: Right arm, Left arm, Chest, Abdomen, Front perineal area, Buttocks, Right upper leg, Left upper leg, Right lower leg, Left lower leg Body parts bathed by helper: Back  Bathing assist Assist Level: Supervision or verbal cues      Upper Body Dressing/Undressing Upper body dressing   What is the patient wearing?: Pull over shirt/dress     Pull over shirt/dress - Perfomed by patient: Thread/unthread right sleeve, Thread/unthread left sleeve, Put head through opening, Pull shirt over trunk          Upper body assist Assist Level: Set up, Supervision or verbal cues   Set up : To obtain clothing/put away  Lower Body Dressing/Undressing Lower body dressing   What is the patient wearing?: Underwear, Pants, Socks, Shoes, [employer]  - Performed by patient: Thread/unthread right underwear leg, Thread/unthread left underwear leg, Pull underwear up/down   Pants- Performed by patient: Thread/unthread right pants leg, Thread/unthread left pants leg, Pull pants up/down, Fasten/unfasten pants       Socks - Performed by patient: Don/doff right sock, Don/doff left sock   Shoes - Performed by patient: Don/doff right shoe, Don/doff left shoe, Fasten right, Fasten left         TED Hose - Performed by helper: Don/doff right TED hose, Don/doff left  TED hose  Lower body assist Assist for lower body dressing: Set up   Set up : Don/doff TED stockings  Toileting Toileting   Toileting steps completed by patient: Adjust clothing prior to toileting, Performs perineal hygiene, Adjust clothing after toileting   Toileting Assistive Devices: Grab bar or rail  Toileting assist Assist level: Supervision or verbal  cues   Transfers Chair/bed transfer   Chair/bed transfer method: Ambulatory Chair/bed transfer assist level: Supervision or verbal cues Chair/bed transfer assistive device: Armrests, Medical sales representative     Max distance: 250 Assist level: Supervision or verbal cues   Wheelchair Wheelchair activity did not occur: N/A        Cognition Comprehension Comprehension assist level: Understands basic 90% of the time/cues < 10% of the time  Expression Expression assist level: Expresses basic 90% of the time/requires cueing < 10% of the time.  Social Interaction Social Interaction assist level: Interacts appropriately 90% of the time - Needs monitoring or encouragement for participation or interaction.  Problem Solving Problem solving assist level: Solves basic 75 - 89% of the time/requires cueing 10 - 24% of the time  Memory Memory assist level: Recognizes or recalls 90% of the time/requires cueing < 10% of the time    Medical Problem List and Plan: 1. Left sided weakness secondary to right parietal occipital lobe infarct on 3/6  Cont CIR 2. DVT Prophylaxis/Anticoagulation: Eliquis 3. Pain Management: Oxycodone as needed 4. Symptomatic right carotid stenosis. 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 Follow-up vascular surgery as needed 5. Neuropsych: This patient is capable of making decisions on his own behalf. 6. Skin/Wound Care: Routine skin checks 7. Fluids/Electrolytes/Nutrition: Routine I&O   Hypokalemia 3.4 on 3/14, will replete and continue to monitor  8. Hypertension/PAF. Norvasc 10 mg daily, Lasix 20 mg daily.   Coreg 3.125 mg twice a day, increased to 6.25 on 3/15  Relatively controlled at present    Cont PRN hydralizine  Will cont to monitor with increased mobility 9. Coronary artery disease. No chest pain or shortness of breath. Patient currently remains on low dose aspirin 10. Hyperlipidemia. Lipitor/zetia 11. BPH. Flomax 0.4 mg daily.  12.  ABLA  Hb 9.5 on 3/14  Will cont to monitor 13. Urinary retention  Will increase Flomax on 3/17  LOS (Days) 4 A FACE TO FACE EVALUATION WAS PERFORMED  Ankit Lorie Phenix 10/18/2015 8:59 AM

## 2015-10-18 NOTE — Progress Notes (Signed)
Occupational Therapy Session Note  Patient Details  Name: [name] MRN: [id number] Date of Birth: 1941/04/20  Today's Date: 10/18/2015 OT Individual Time: 1300-1400 OT Individual Time Calculation (min): 60 min   Short Term Goals: Week 1:  OT Short Term Goal 1 (Week 1): STG=LTG d/t short LOS  Skilled Therapeutic Interventions/Progress Updates: iADL retraining with focus on safety awareness, functional mobility using RW, and dynamic standing balance.   Pt re-educated on goal to perform simple meal prep prior to d/c.   Pt clarifies that his significant other visits him daily at home and often provides meals and related home making assist however pt confirms that he will cook chicken (boil, fry, and bake), randomly.   OT re-educates pt on his observed balance deficits and need for supervision during cooking or kitchen tasks with challenge provided for pt to demonstrate his ability to reach items in all planes and attend to details of task presented: making pancakes.   Pt accepts challenge and proceeds as directed with moderate vc to locate items and instruction cues to use counter tops for stability.   Pt completes task at supervision level w/o loss of balance noted but with continued cues needed to use solid structures (countertop) or RW for stability during dynamic standing.   Pt minimizes his balance deficits but acknowledges need for RW during mobility.        Therapy Documentation Precautions:  Precautions Precautions: Fall Restrictions Weight Bearing Restrictions: No  Vital Signs: Therapy Vitals Temp: 97.8 F (36.6 C) Temp Source: Oral Pulse Rate: 78 Resp: 18 BP: 123/70 mmHg Patient Position (if appropriate): Sitting Oxygen Therapy SpO2: 100 % O2 Device: Not Delivered O2 Flow Rate (L/min): 98 L/min  ADL: ADL ADL Comments: see Functional Assessment Tool   See Function Navigator for Current Functional Status.   Therapy/Group: Individual  Therapy  [place] 10/18/2015, 2:29 PM

## 2015-10-18 NOTE — Discharge Instructions (Signed)
Inpatient Rehab Discharge Instructions  [name] Discharge date and time: No discharge date for patient encounter.   Activities/Precautions/ Functional Status: Activity: activity as tolerated Diet: regular diet Wound Care: none needed Functional status:  ___ No restrictions     ___ Walk up steps independently ___ 24/7 supervision/assistance   ___ Walk up steps with assistance ___ Intermittent supervision/assistance  ___ Bathe/dress independently ___ Walk with walker     ___ Bathe/dress with assistance ___ Walk Independently    ___ Shower independently _x__ Walk with assistance    ___ Shower with assistance ___ No alcohol     ___ Return to work/school ________  Special Instructions: No Driving    COMMUNITY REFERRALS UPON DISCHARGE:    Home Health:   PT, OT, RN  [place]   Date of last service:10/21/2015  Medical Equipment/Items [place]  [phone]   GENERAL COMMUNITY RESOURCES FOR PATIENT/FAMILY: Support Groups:[facility] SUPPORT GROUP EVERY SECOND Thursday @ 3:00-4;00 PM ON THE REHAB UNIT QUESTIONS CONTACT KATIE [phone]  My questions have been answered and I understand these instructions. I will adhere to these goals and the provided educational materials after my discharge from the hospital.  Patient/Caregiver Signature _______________________________ Date __________  Clinician Signature _______________________________________ Date __________  Please bring this form and your medication list with you to all your follow-up doctor's appointments.  STROKE/TIA DISCHARGE INSTRUCTIONS SMOKING Cigarette smoking nearly doubles your risk of having a stroke & is the single most alterable risk factor  If you smoke or have smoked in the last 12 months, you are advised to quit smoking for your health.  Most of the excess cardiovascular risk related to smoking disappears within a  year of stopping.  Ask you doctor about anti-smoking medications  [place] Quit Line: 1-800-QUIT NOW  Free Smoking Cessation Classes [phone]  CHOLESTEROL Know your levels; limit fat & cholesterol in your diet  Lipid Panel     Component Value Date/Time   CHOL 262* 10/07/2015 1114   TRIG 118 10/07/2015 1114   HDL 32* 10/07/2015 1114   CHOLHDL 8.2 10/07/2015 1114   VLDL 24 10/07/2015 1114   LDLCALC 206* 10/07/2015 1114      Many patients benefit from treatment even if their cholesterol is at goal.  Goal: Total Cholesterol (CHOL) less than 160  Goal:  Triglycerides (TRIG) less than 150  Goal:  HDL greater than 40  Goal:  LDL (LDLCALC) less than 100   BLOOD PRESSURE American Stroke Association blood pressure target is less that 120/80 mm/Hg  Your discharge blood pressure is:  BP: (!) 160/83 mmHg  Monitor your blood pressure  Limit your salt and alcohol intake  Many individuals will require more than one medication for high blood pressure  DIABETES (A1c is a blood sugar average for last 3 months) Goal HGBA1c is under 7% (HBGA1c is blood sugar average for last 3 months)  Diabetes: No known diagnosis of diabetes    Lab Results  Component Value Date   HGBA1C 6.2* 10/07/2015     Your HGBA1c can be lowered with medications, healthy diet, and exercise.  Check your blood sugar as directed by your physician  Call your physician if you experience unexplained or low blood sugars.  PHYSICAL ACTIVITY/REHABILITATION Goal is 30 minutes at least 4 days per week  Activity: Increase activity slowly, Therapies: Physical Therapy: Home Health Return to work:   Activity decreases your risk of heart attack and stroke and  makes your heart stronger.  It helps control your weight and blood pressure; helps you relax and can improve your mood.  Participate in a regular exercise program.  Talk with your doctor about the best form of exercise for you (dancing, walking, swimming, cycling).    DIET/WEIGHT Goal is to maintain a healthy weight  Your discharge diet is: Diet Heart Room service appropriate?: Yes; Fluid consistency:: Thin  liquids Your height is:  Height: 5' 6.5" (168.9 cm) Your current weight is: Weight: 74.3 kg (163 lb 12.8 oz) Your Body Mass Index (BMI) is:  BMI (Calculated): 26.7  Following the type of diet specifically designed for you will help prevent another stroke.  Your goal weight range is:    Your goal Body Mass Index (BMI) is 19-24.  Healthy food habits can help reduce 3 risk factors for stroke:  High cholesterol, hypertension, and excess weight.  RESOURCES Stroke/Support Group:  Call [phone]   STROKE EDUCATION PROVIDED/REVIEWED AND GIVEN TO PATIENT Stroke warning signs and symptoms How to activate emergency medical system (call 911). Medications prescribed at discharge. Need for follow-up after discharge. Personal risk factors for stroke. Pneumonia vaccine given:  STROKE/TIA DISCHARGE INSTRUCTIONS SMOKING Cigarette smoking nearly doubles your risk of having a stroke & is the single most alterable risk factor  If you smoke or have smoked in the last 12 months, you are advised to quit smoking for your health.  Most of the excess cardiovascular risk related to smoking disappears within a year of stopping.  Ask you doctor about anti-smoking medications  [place] Quit Line: 1-800-QUIT NOW  Free Smoking Cessation Classes [phone]  CHOLESTEROL Know your levels; limit fat & cholesterol in your diet  Lipid Panel     Component Value Date/Time   CHOL 262* 10/07/2015 1114   TRIG 118 10/07/2015 1114   HDL 32* 10/07/2015 1114   CHOLHDL 8.2 10/07/2015 1114   VLDL 24 10/07/2015 1114   LDLCALC 206* 10/07/2015 1114      Many patients benefit from treatment even if their cholesterol is at goal.  Goal: Total Cholesterol (CHOL) less than 160  Goal:  Triglycerides (TRIG) less than 150  Goal:  HDL greater than 40  Goal:  LDL (LDLCALC) less than  100   BLOOD PRESSURE American Stroke Association blood pressure target is less that 120/80 mm/Hg  Your discharge blood pressure is:  BP: (!) 160/83 mmHg  Monitor your blood pressure  Limit your salt and alcohol intake  Many individuals will require more than one medication for high blood pressure  DIABETES (A1c is a blood sugar average for last 3 months) Goal HGBA1c is under 7% (HBGA1c is blood sugar average for last 3 months)  Diabetes: No known diagnosis of diabetes    Lab Results  Component Value Date   HGBA1C 6.2* 10/07/2015     Your HGBA1c can be lowered with medications, healthy diet, and exercise.  Check your blood sugar as directed by your physician  Call your physician if you experience unexplained or low blood sugars.  PHYSICAL ACTIVITY/REHABILITATION Goal is 30 minutes at least 4 days per week  Activity: Increase activity slowly, Therapies: Physical Therapy: Home Health Return to work:   Activity decreases your risk of heart attack and stroke and makes your heart stronger.  It helps control your weight and blood pressure; helps you relax and can improve your mood.  Participate in a regular exercise program.  Talk with your doctor about the best form of exercise for you (  dancing, walking, swimming, cycling).  DIET/WEIGHT Goal is to maintain a healthy weight  Your discharge diet is: Diet Heart Room service appropriate?: Yes; Fluid consistency:: Thin  liquids Your height is:  Height: 5' 6.5" (168.9 cm) Your current weight is: Weight: 74.3 kg (163 lb 12.8 oz) Your Body Mass Index (BMI) is:  BMI (Calculated): 26.7  Following the type of diet specifically designed for you will help prevent another stroke.  Your goal weight range is:    Your goal Body Mass Index (BMI) is 19-24.  Healthy food habits can help reduce 3 risk factors for stroke:  High cholesterol, hypertension, and excess weight.  RESOURCES Stroke/Support Group:  Call [phone]   STROKE EDUCATION  PROVIDED/REVIEWED AND GIVEN TO PATIENT Stroke warning signs and symptoms How to activate emergency medical system (call 911). Medications prescribed at discharge. Need for follow-up after discharge. Personal risk factors for stroke. Pneumonia vaccine given:  Flu vaccine given:  My questions have been answered, the writing is legible, and I understand these instructions.  I will adhere to these goals & educational materials that have been provided to me after my discharge from the hospital.     Flu vaccine given:  My questions have been answered, the writing is legible, and I understand these instructions.  I will adhere to these goals & educational materials that have been provided to me after my discharge from the hospital.    [place] Cigarette smoking nearly doubles your risk of having a stroke & is the single most alterable risk factor  If you smoke or have smoked in the last 12 months, you are advised to quit smoking for your health.  Most of the excess cardiovascular risk related to smoking disappears within a year of stopping.  Ask you doctor about anti-smoking medications  [place] Quit Line: 1-800-QUIT NOW  Free Smoking Cessation Classes [phone]  CHOLESTEROL Know your levels; limit fat & cholesterol in your diet  Lipid Panel     Component Value Date/Time   CHOL 262* 10/07/2015 1114   TRIG 118 10/07/2015 1114   HDL 32* 10/07/2015 1114   CHOLHDL 8.2 10/07/2015 1114   VLDL 24 10/07/2015 1114   LDLCALC 206* 10/07/2015 1114      Many patients benefit from treatment even if their cholesterol is at goal.  Goal: Total Cholesterol (CHOL) less than 160  Goal:  Triglycerides (TRIG) less than 150  Goal:  HDL greater than 40  Goal:  LDL (LDLCALC) less than 100   BLOOD PRESSURE American Stroke Association blood pressure target is less that 120/80 mm/Hg  Your discharge blood pressure is:  BP: (!) 160/83 mmHg  Monitor your blood  pressure  Limit your salt and alcohol intake  Many individuals will require more than one medication for high blood pressure  DIABETES (A1c is a blood sugar average for last 3 months) Goal HGBA1c is under 7% (HBGA1c is blood sugar average for last 3 months)  Diabetes: No known diagnosis of diabetes    Lab Results  Component Value Date   HGBA1C 6.2* 10/07/2015     Your HGBA1c can be lowered with medications, healthy diet, and exercise.  Check your blood sugar as directed by your physician  Call your physician if you experience unexplained or low blood sugars.  PHYSICAL ACTIVITY/REHABILITATION Goal is 30 minutes at least 4 days per week  Activity: Increase activity slowly, Therapies: Physical Therapy: Home Health Return to work:   Activity decreases your risk of heart  attack and stroke and makes your heart stronger.  It helps control your weight and blood pressure; helps you relax and can improve your mood.  Participate in a regular exercise program.  Talk with your doctor about the best form of exercise for you (dancing, walking, swimming, cycling).  DIET/WEIGHT Goal is to maintain a healthy weight  Your discharge diet is: Diet Heart Room service appropriate?: Yes; Fluid consistency:: Thin  liquids Your height is:  Height: 5' 6.5" (168.9 cm) Your current weight is: Weight: 74.3 kg (163 lb 12.8 oz) Your Body Mass Index (BMI) is:  BMI (Calculated): 26.7  Following the type of diet specifically designed for you will help prevent another stroke.  Your goal weight range is:    Your goal Body Mass Index (BMI) is 19-24.  Healthy food habits can help reduce 3 risk factors for stroke:  High cholesterol, hypertension, and excess weight.  RESOURCES Stroke/Support Group:  Call [phone]   STROKE EDUCATION PROVIDED/REVIEWED AND GIVEN TO PATIENT Stroke warning signs and symptoms How to activate emergency medical system (call 911). Medications prescribed at discharge. Need for  follow-up after discharge. Personal risk factors for stroke. Pneumonia vaccine given:  Flu vaccine given:  My questions have been answered, the writing is legible, and I understand these instructions.  I will adhere to these goals & educational materials that have been provided to me after my discharge from the hospital.

## 2015-10-18 NOTE — Progress Notes (Signed)
Physical Therapy Session Note  Patient Details  Name: [name] MRN: [id number] Date of Birth: 07-Dec-1940  Today's Date: 10/18/2015 PT Individual Time: 0901-0959 PT Individual Time Calculation (min): 58 min   Short Term Goals: Week 1:   STG = LTG due to short length of stay.   Skilled Therapeutic Interventions/Progress Updates:    Patient received sitting in recliner with Nurse tech present. Patient performed standing balance training while performing personal hygiene at sink including facial wash and shave. Supervsion A required with standing balance and min cues for proper sequencing. Gait with RW in hall, 19ft x 2, supervision A with cues for increased step height on the L LE. Standing balance on airex with ball toss. Step ups from airex to 6 inch step. Toe taps on 6 inch cone from airex with 1 UE support and 2 UE support. Dynamic balance for forward and lateral stepping to 1 of 5 targets, min A x 15. Dynamic balance for lateral step and cone tap to 2 of 5 targets x 12. Min A from PT for all standing balance exercises with cues for improved weight shifting and proper speed of movement. Patient left in room sitting in recliner with call bell within reach.   Therapy Documentation Precautions:  Precautions Precautions: Fall Restrictions Weight Bearing Restrictions: No General:   Vital Signs:  Pain: Pain Assessment Pain Assessment: No/denies pain Pain Score: 0-No pain  See Function Navigator for Current Functional Status.   Therapy/Group: Individual Therapy  Lorie Phenix 10/18/2015, 12:28 PM

## 2015-10-19 ENCOUNTER — Ambulatory Visit (HOSPITAL_COMMUNITY): Payer: Medicare Other | Admitting: Physical Therapy

## 2015-10-19 NOTE — Progress Notes (Signed)
[facility] PHYSICAL MEDICINE & REHABILITATION     PROGRESS NOTE  Subjective/Complaints:  Lying in bed without complaints. Asked how things would be organized for discharge on Monday. .  ROS: Denies CP, SOB, N/V/D.  Objective: Vital Signs: Blood pressure 135/54, pulse 72, temperature 99.1 F (37.3 C), temperature source Oral, resp. rate 16, height 5' 6.5" (1.689 m), weight 74.3 kg (163 lb 12.8 oz), SpO2 94 %. No results found. No results for input(s): WBC, HGB, HCT, PLT in the last 72 hours. No results for input(s): NA, K, CL, GLUCOSE, BUN, CREATININE, CALCIUM in the last 72 hours.  Invalid input(s): CO CBG (last 3)  No results for input(s): GLUCAP in the last 72 hours.  Wt Readings from Last 3 Encounters:  10/16/15 74.3 kg (163 lb 12.8 oz)  10/10/15 78.291 kg (172 lb 9.6 oz)  06/21/15 76.522 kg (168 lb 11.2 oz)    Physical Exam:  BP 135/54 mmHg  Pulse 72  Temp(Src) 99.1 F (37.3 C) (Oral)  Resp 16  Ht 5' 6.5" (1.689 m)  Wt 74.3 kg (163 lb 12.8 oz)  BMI 26.05 kg/m2  SpO2 94% Constitutional: He appears well-developed and well-nourished.  HENT: Normocephalic and atraumatic.  Eyes: Sclera slightly icteric and injected. EOM are normal.  Cardiovascular: Normal rate and regular rhythm.  Respiratory: Effort normal and breath sounds normal. No respiratory distress. GI: Soft. Bowel sounds are normal. He exhibits no distension.  Musculoskeletal: He exhibits no edema or tenderness.  Neurological: He is alert.  Motor: b/l UE 4+/5, B/l LE 5/5 (right slightly stronger than left)  Mild ataxia LUE>RUE  (improving) Skin: Skin is warm and dry.  Psychiatric: He has a normal mood and affect. His behavior is normal  Assessment/Plan: 1. Functional deficits secondary to right parietal occipital lobe infarct which require 3+ hours per day of interdisciplinary therapy in a comprehensive inpatient rehab setting. Physiatrist is providing close team supervision and 24 hour management of  active medical problems listed below. Physiatrist and rehab team continue to assess barriers to discharge/monitor patient progress toward functional and medical goals.  Function:  Bathing Bathing position   Position: Shower  Bathing parts Body parts bathed by patient: Right arm, Left arm, Chest, Abdomen, Front perineal area, Buttocks, Right upper leg, Left upper leg, Right lower leg, Left lower leg Body parts bathed by helper: Back  Bathing assist Assist Level: Supervision or verbal cues      Upper Body Dressing/Undressing Upper body dressing   What is the patient wearing?: Pull over shirt/dress     Pull over shirt/dress - Perfomed by patient: Thread/unthread right sleeve, Thread/unthread left sleeve, Put head through opening, Pull shirt over trunk          Upper body assist Assist Level: Set up, Supervision or verbal cues   Set up : To obtain clothing/put away  Lower Body Dressing/Undressing Lower body dressing   What is the patient wearing?: Underwear, Pants, Socks, Shoes, [employer]  - Performed by patient: Thread/unthread right underwear leg, Thread/unthread left underwear leg, Pull underwear up/down   Pants- Performed by patient: Thread/unthread right pants leg, Thread/unthread left pants leg, Pull pants up/down, Fasten/unfasten pants       Socks - Performed by patient: Don/doff right sock, Don/doff left sock   Shoes - Performed by patient: Don/doff right shoe, Don/doff left shoe, Fasten right, Fasten left         TED Hose - Performed by helper: Don/doff right TED hose, Don/doff left TED hose  Lower  body assist Assist for lower body dressing: Set up   Set up : To obtain clothing/put away, Don/doff TED stockings  Toileting Toileting   Toileting steps completed by patient: Adjust clothing prior to toileting, Performs perineal hygiene, Adjust clothing after toileting   Toileting Assistive Devices: Grab bar or rail  Toileting assist Assist level: Supervision  or verbal cues   Transfers Chair/bed transfer   Chair/bed transfer method: Ambulatory Chair/bed transfer assist level: Supervision or verbal cues Chair/bed transfer assistive device: Armrests, Medical sales representative     Max distance: 250 Assist level: Supervision or verbal cues   Wheelchair Wheelchair activity did not occur: N/A        Cognition Comprehension Comprehension assist level: Understands basic 90% of the time/cues < 10% of the time  Expression Expression assist level: Expresses basic 90% of the time/requires cueing < 10% of the time.  Social Interaction Social Interaction assist level: Interacts appropriately 90% of the time - Needs monitoring or encouragement for participation or interaction.  Problem Solving Problem solving assist level: Solves basic 75 - 89% of the time/requires cueing 10 - 24% of the time  Memory Memory assist level: Recognizes or recalls 90% of the time/requires cueing < 10% of the time    Medical Problem List and Plan: 1. Left sided weakness secondary to right parietal occipital lobe infarct on 3/6  Cont CIR 2. DVT Prophylaxis/Anticoagulation: Eliquis 3. Pain Management: Oxycodone as needed 4. Symptomatic right carotid stenosis. 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 Follow-up vascular surgery as needed 5. Neuropsych: This patient is capable of making decisions on his own behalf. 6. Skin/Wound Care: Routine skin checks 7. Fluids/Electrolytes/Nutrition: Routine I&O   Hypokalemia 3.4 on 3/14, supplemented 8. Hypertension/PAF. Norvasc 10 mg daily, Lasix 20 mg daily.   Coreg 3.125 mg twice a day, increased to 6.25 on 3/15   controlled at present    Cont PRN hydralizine  Will cont to monitor with increased mobility 9. Coronary artery disease. No chest pain or shortness of breath. Patient currently remains on low dose aspirin 10. Hyperlipidemia. Lipitor/zetia 11. BPH. Flomax 0.8 mg daily now.  12. ABLA  Hb 9.5 on  3/14  Will cont to monitor 13. Urinary retention  increased Flomax on 3/17---only PVR 180 yesterday  -continue to check pvr's prior to dc  LOS (Days) 5 A FACE TO FACE EVALUATION WAS PERFORMED  SWARTZ,ZACHARY T 10/19/2015 9:33 AM

## 2015-10-19 NOTE — Progress Notes (Signed)
Physical Therapy Note  Patient Details  Name: [name] MRN: [id number] Date of Birth: [birth year] Today's Date: 10/19/2015    Time: 1415-1515 60 minutes  Concurrent PT session:  Pt performed gait with supervision with RW 150' x 2 with cues to stay inside RW.  Gait with obstacle negotiation with min A for stepping over obstacles with RW, supervision for obstacle negotiation and side stepping with mod verbal and visual cues needed for safety with side stepping obstacles.  Standing balance with horseshoe toss without UE support with supervision.  [place] exercises performed with 2# B LE for strengthening and balance training.   DONAWERTH,KAREN 10/19/2015, 3:22 PM

## 2015-10-20 ENCOUNTER — Inpatient Hospital Stay (HOSPITAL_COMMUNITY): Payer: Medicare Other

## 2015-10-20 ENCOUNTER — Inpatient Hospital Stay (HOSPITAL_COMMUNITY): Payer: Medicare Other | Admitting: Occupational Therapy

## 2015-10-20 ENCOUNTER — Inpatient Hospital Stay (HOSPITAL_COMMUNITY): Payer: Medicare Other | Admitting: Physical Therapy

## 2015-10-20 NOTE — Progress Notes (Signed)
[name] PHYSICAL MEDICINE & REHABILITATION     PROGRESS NOTE  Subjective/Complaints:  No complaints. Just finished breakfast sitting EOB. Ready for dc. .  ROS: Denies CP, SOB, N/V/D.  Objective: Vital Signs: Blood pressure 141/64, pulse 74, temperature 98.7 F (37.1 C), temperature source Oral, resp. rate 18, height 5' 6.5" (1.689 m), weight 74.3 kg (163 lb 12.8 oz), SpO2 99 %. No results found. No results for input(s): WBC, HGB, HCT, PLT in the last 72 hours. No results for input(s): NA, K, CL, GLUCOSE, BUN, CREATININE, CALCIUM in the last 72 hours.  Invalid input(s): CO CBG (last 3)  No results for input(s): GLUCAP in the last 72 hours.  Wt Readings from Last 3 Encounters:  10/16/15 74.3 kg (163 lb 12.8 oz)  10/10/15 78.291 kg (172 lb 9.6 oz)  06/21/15 76.522 kg (168 lb 11.2 oz)    Physical Exam:  BP 141/64 mmHg  Pulse 74  Temp(Src) 98.7 F (37.1 C) (Oral)  Resp 18  Ht 5' 6.5" (1.689 m)  Wt 74.3 kg (163 lb 12.8 oz)  BMI 26.05 kg/m2  SpO2 99% Constitutional: He appears well-developed and well-nourished.  HENT: Normocephalic and atraumatic.  Eyes: Sclera slightly icteric and injected. EOM are normal.  Cardiovascular: Normal rate and regular rhythm.  Respiratory: Effort normal and breath sounds normal. No respiratory distress. GI: Soft. Bowel sounds are normal. He exhibits no distension.  Musculoskeletal: He exhibits no edema or tenderness.  Neurological: He is alert.  Motor: b/l UE 4+/5, B/l LE 5/5 (right slightly stronger than left)  Mild ataxia LUE>RUE  (improving) Skin: Skin is warm and dry.  Psychiatric: He has a normal mood and affect. His behavior is normal  Assessment/Plan: 1. Functional deficits secondary to right parietal occipital lobe infarct which require 3+ hours per day of interdisciplinary therapy in a comprehensive inpatient rehab setting. Physiatrist is providing close team supervision and 24 hour management of active medical problems  listed below. Physiatrist and rehab team continue to assess barriers to discharge/monitor patient progress toward functional and medical goals.  Function:  Bathing Bathing position   Position: Shower  Bathing parts Body parts bathed by patient: Right arm, Left arm, Chest, Abdomen, Front perineal area, Buttocks, Right upper leg, Left upper leg, Right lower leg, Left lower leg Body parts bathed by helper: Back  Bathing assist Assist Level: Supervision or verbal cues      Upper Body Dressing/Undressing Upper body dressing   What is the patient wearing?: Pull over shirt/dress     Pull over shirt/dress - Perfomed by patient: Thread/unthread right sleeve, Thread/unthread left sleeve, Put head through opening, Pull shirt over trunk          Upper body assist Assist Level: Set up, Supervision or verbal cues   Set up : To obtain clothing/put away  Lower Body Dressing/Undressing Lower body dressing   What is the patient wearing?: Underwear, Pants, Socks, Shoes, [employer]  - Performed by patient: Thread/unthread right underwear leg, Thread/unthread left underwear leg, Pull underwear up/down   Pants- Performed by patient: Thread/unthread right pants leg, Thread/unthread left pants leg, Pull pants up/down, Fasten/unfasten pants       Socks - Performed by patient: Don/doff right sock, Don/doff left sock   Shoes - Performed by patient: Don/doff right shoe, Don/doff left shoe, Fasten right, Fasten left         TED Hose - Performed by helper: Don/doff right TED hose, Don/doff left TED hose  Lower body assist Assist for lower  body dressing: Set up   Set up : To obtain clothing/put away, Don/doff TED stockings  Toileting Toileting   Toileting steps completed by patient: Adjust clothing prior to toileting, Performs perineal hygiene, Adjust clothing after toileting   Toileting Assistive Devices: Grab bar or rail  Toileting assist Assist level: Supervision or verbal cues    Transfers Chair/bed transfer   Chair/bed transfer method: Ambulatory Chair/bed transfer assist level: Supervision or verbal cues Chair/bed transfer assistive device: Armrests, Medical sales representative     Max distance: 250 Assist level: Supervision or verbal cues   Wheelchair Wheelchair activity did not occur: N/A        Cognition Comprehension Comprehension assist level: Understands basic 90% of the time/cues < 10% of the time  Expression Expression assist level: Expresses basic 90% of the time/requires cueing < 10% of the time.  Social Interaction Social Interaction assist level: Interacts appropriately 90% of the time - Needs monitoring or encouragement for participation or interaction.  Problem Solving Problem solving assist level: Solves basic 75 - 89% of the time/requires cueing 10 - 24% of the time  Memory Memory assist level: Recognizes or recalls 90% of the time/requires cueing < 10% of the time    Medical Problem List and Plan: 1. Left sided weakness secondary to right parietal occipital lobe infarct on 3/6  Cont CIR  -for dc tomorrow 2. DVT Prophylaxis/Anticoagulation: Eliquis 3. Pain Management: Oxycodone as needed 4. Symptomatic right carotid stenosis. 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 Follow-up vascular surgery as needed 5. Neuropsych: This patient is capable of making decisions on his own behalf. 6. Skin/Wound Care: Routine skin checks 7. Fluids/Electrolytes/Nutrition: Routine I&O   Hypokalemia 3.4 on 3/14, supplemented 8. Hypertension/PAF. Norvasc 10 mg daily, Lasix 20 mg daily.   Coreg 3.125 mg twice a day, increased to 6.25 on 3/15   controlled at present    Cont PRN hydralizine  Will cont to monitor with increased mobility 9. Coronary artery disease. No chest pain or shortness of breath. Patient currently remains on low dose aspirin 10. Hyperlipidemia. Lipitor/zetia 11. BPH. Flomax 0.8 mg daily now.  12. ABLA  Hb 9.5 on  3/14  Will cont to monitor 13. Urinary retention  increased Flomax on 3/17---voiding without residual     LOS (Days) 6 A FACE TO FACE EVALUATION WAS PERFORMED  [name] 10/20/2015 9:34 AM

## 2015-10-20 NOTE — Progress Notes (Signed)
Occupational Therapy Session Note  Patient Details  Name: [name] MRN: [id number] Date of Birth: [birth year]  Today's Date: 10/20/2015 OT Individual Time: 1100-1159 OT Individual Time Calculation (min): 59 min    Short Term Goals: Week 1:  OT Short Term Goal 1 (Week 1): STG=LTG d/t short LOS  Skilled Therapeutic Interventions/Progress Updates:    Pt resting in recliner upon arrival and agreeable to participating in therapy.  Pt engaged in BADL retraining including bathing at shower level and dressing with sit<>stand from recliner.  Pt amb with RW in room to gather clothing prior to amb into bathroom for shower.  Pt required min verbal cues for safety for RW management in close quarters of bathroom.  Pt attempted to stand X 2 on one foot to bathe feet.  Pt required verbal cues for safety.  Pt verbalized understanding but required verbal cues again when attempting to dry feet while standing on one leg.  Pt completed dressing tasks with no verbal cues.  Focus on activity tolerance, sit<>stand, standing balance, functional amb with RW, and safety awareness.  Therapy Documentation Precautions:  Precautions Precautions: Fall Restrictions Weight Bearing Restrictions: No Pain:  Pt denied pain ADL: ADL ADL Comments: see Functional Assessment Tool  See Function Navigator for Current Functional Status.   Therapy/Group: Individual Therapy  Leroy Libman 10/20/2015, 12:02 PM

## 2015-10-20 NOTE — Progress Notes (Signed)
Physical Therapy Session Note  Patient Details  Name: [name] MRN: [id number] Date of Birth: [birth year]  Today's Date: 10/20/2015 PT Individual Time: 0900-1015 PT Individual Time Calculation (min): 75 min   Short Term Goals: Week 1:   STG = LTG 2/2 short ELOS.  Skilled Therapeutic Interventions/Progress Updates:    Pt received in bed & agreeable to PT, denying c/o pain. Pt transferred out of bed & ambulated from room>rehab apartment with RW and supervision A. Pt attempted ambulating without AD with PT assistance (CGA), and pt demonstrated lateral sway & occasional scissoring gait. Pt ambulated with RW for remainder of session & educated on decreased balance/safety when ambulating without AD. Pt transferred sit<>supine, and rolled L<>R, from elevated bed in rehab apartment with supervision A. Pt then completed berg balance scale and scored 41/56, which is same score as initial assessment. Patient demonstrates increased fall risk as noted by score of 41/56 on Berg Balance Scale.  (<36= high risk for falls, close to 100%; 37-45 significant >80%; 46-51 moderate >50%; 52-55 lower >25%). PT educated pt on unchanged Berg score and continued significant fall risk. PT educated pt on need to d/c home with supervision, at least during first week home, as pt's balance remains unchanged and he is very impulsive with transfers and ambulation requiring cuing for safety from therapist. Pt reported he does not have anyone that could stay home with him 24/7 but he may have friends that could stop by every other day. PT educated pt to contact family to see if it's a possibility for them to stay with him upon initial return home; pt voiced understanding. Pt then negotiated stairs with L ascending rail and supervision A. PT educated pt on sequencing to increase ease of task but pt unable to demonstrate this consistently & reported he felt more comfortable negotiating stairs without following method. Pt then ambulated  back to room with RW & supervision with PT cuing pt for increased LLE heel strike; at end of session pt was left in recliner with all needs met & call bell within reach.   Therapy Documentation Precautions:  Precautions Precautions: Fall Restrictions Weight Bearing Restrictions: No  Pain: Pain Assessment Pain Assessment: No/denies pain  Balance: Balance Balance Assessed: Yes Berg Balance Test Sit to Stand: Able to stand without using hands and stabilize independently Standing Unsupported: Able to stand safely 2 minutes Sitting with Back Unsupported but Feet Supported on Floor or Stool: Able to sit safely and securely 2 minutes Stand to Sit: Sits safely with minimal use of hands Transfers: Able to transfer safely, definite need of hands Standing Unsupported with Eyes Closed: Able to stand 10 seconds safely Standing Ubsupported with Feet Together: Able to place feet together independently but unable to hold for 30 seconds From Standing, Reach Forward with Outstretched Arm: Can reach forward >12 cm safely (5") From Standing Position, Pick up Object from Floor: Able to pick up shoe safely and easily From Standing Position, Turn to Look Behind Over each Shoulder: Looks behind from both sides and weight shifts well Turn 360 Degrees: Needs close supervision or verbal cueing Standing Unsupported, Alternately Place Feet on Step/Stool: Able to complete 4 steps without aid or supervision Standing Unsupported, One Foot in Front: Able to take small step independently and hold 30 seconds Standing on One Leg: Unable to try or needs assist to prevent fall Total Score: 41   See Function Navigator for Current Functional Status.   Therapy/Group: Individual Therapy  [name] 10/20/2015,  5:52 PM

## 2015-10-20 NOTE — Progress Notes (Addendum)
Occupational Therapy Session Note  Patient Details  Name: [name] MRN: [id number] Date of Birth: 1941/06/09  Today's Date: 10/20/2015 OT Group Time:  -  1430-1530       Short Term Goals: Week 1:  OT Short Term Goal 1 (Week 1): STG=LTG d/t short LOS Week 2:     Skilled Therapeutic Interventions/Progress Updates:    Engaged in therapeutic activities using ambulation, balance, sit to stand, and cognition.  Pt ambulated around in day room while playing word game.  He did sit to stand x 10 with cues to use arm rest to push up from.   He followed the one step directions with multiple repetitions for understanding.  He had no LOB and mild signs of fatique at end of session.  Pt. Left with call bell, phone and all needs in reach after returning to room.  .       Therapy Documentation Precautions:  Precautions Precautions: Fall Restrictions Weight Bearing Restrictions: No      Pain:  none   ADL: ADL ADL Comments: see Functional Assessment Tool      See Function Navigator for Current Functional Status.   Therapy/Group: Group Therapy  Lisa Roca 10/20/2015, 3:54 PM

## 2015-10-21 ENCOUNTER — Inpatient Hospital Stay (HOSPITAL_COMMUNITY): Payer: Medicare Other

## 2015-10-21 ENCOUNTER — Inpatient Hospital Stay (HOSPITAL_COMMUNITY): Payer: Medicare Other | Admitting: Physical Therapy

## 2015-10-21 LAB — CBC WITH DIFFERENTIAL/PLATELET
BASOS ABS: 0 10*3/uL (ref 0.0–0.1)
Basophils Relative: 0 %
Eosinophils Absolute: 0.1 10*3/uL (ref 0.0–0.7)
Eosinophils Relative: 2 %
HEMATOCRIT: 27.2 % — AB (ref 39.0–52.0)
HEMOGLOBIN: 8.7 g/dL — AB (ref 13.0–17.0)
LYMPHS PCT: 27 %
Lymphs Abs: 1.5 10*3/uL (ref 0.7–4.0)
MCH: 21.6 pg — ABNORMAL LOW (ref 26.0–34.0)
MCHC: 32 g/dL (ref 30.0–36.0)
MCV: 67.5 fL — ABNORMAL LOW (ref 78.0–100.0)
MONOS PCT: 8 %
Monocytes Absolute: 0.4 10*3/uL (ref 0.1–1.0)
NEUTROS ABS: 3.5 10*3/uL (ref 1.7–7.7)
NEUTROS PCT: 63 %
Platelets: 235 10*3/uL (ref 150–400)
RBC: 4.03 MIL/uL — ABNORMAL LOW (ref 4.22–5.81)
RDW: 15 % (ref 11.5–15.5)
WBC: 5.5 10*3/uL (ref 4.0–10.5)

## 2015-10-21 LAB — BASIC METABOLIC PANEL
ANION GAP: 11 (ref 5–15)
BUN: 16 mg/dL (ref 6–20)
CHLORIDE: 102 mmol/L (ref 101–111)
CO2: 25 mmol/L (ref 22–32)
CREATININE: 1.02 mg/dL (ref 0.61–1.24)
Calcium: 8.7 mg/dL — ABNORMAL LOW (ref 8.9–10.3)
GFR calc non Af Amer: >60 mL/min (ref >60)
Glucose, Bld: 88 mg/dL (ref 65–99)
POTASSIUM: 4 mmol/L (ref 3.5–5.1)
Sodium: 138 mmol/L (ref 135–145)

## 2015-10-21 MED ORDER — ISOSORBIDE MONONITRATE ER 30 MG PO TB24: 30.0000 mg | ORAL_TABLET | Freq: Every day | ORAL | Status: DC

## 2015-10-21 MED ORDER — CARVEDILOL 6.25 MG PO TABS: 6.2500 mg | ORAL_TABLET | Freq: Two times a day (BID) | ORAL | Status: DC

## 2015-10-21 MED ORDER — ATORVASTATIN CALCIUM 80 MG PO TABS
80.0000 mg | ORAL_TABLET | Freq: Every day | ORAL | Status: DC
Start: 2015-10-21 — End: 2016-03-11

## 2015-10-21 MED ORDER — FUROSEMIDE 20 MG PO TABS: 20.0000 mg | ORAL_TABLET | Freq: Every day | ORAL | Status: DC

## 2015-10-21 MED ORDER — AMLODIPINE BESYLATE 10 MG PO TABS: 10.0000 mg | ORAL_TABLET | Freq: Every day | ORAL | Status: DC

## 2015-10-21 MED ORDER — OXYCODONE HCL 5 MG PO TABS: 5.0000 mg | ORAL_TABLET | Freq: Four times a day (QID) | ORAL | Status: DC | PRN

## 2015-10-21 MED ORDER — APIXABAN 5 MG PO TABS: 5.0000 mg | ORAL_TABLET | Freq: Two times a day (BID) | ORAL | Status: DC

## 2015-10-21 MED ORDER — PANTOPRAZOLE SODIUM 40 MG PO TBEC: 40.0000 mg | DELAYED_RELEASE_TABLET | Freq: Every day | ORAL | Status: DC

## 2015-10-21 NOTE — Progress Notes (Addendum)
[place] PHYSICAL MEDICINE & REHABILITATION     PROGRESS NOTE  Subjective/Complaints:  Pt in the shower this AM.  He is seen drying himself off standing on 1 leg, but states that he wants his left leg to be even stronger. He also states that he plans on getting a condom cath at home.  ROS: Denies CP, SOB, N/V/D.  Objective: Vital Signs: Blood pressure 124/55, pulse 68, temperature 98.6 F (37 C), temperature source Oral, resp. rate 18, height 5' 6.5" (1.689 m), weight 74.3 kg (163 lb 12.8 oz), SpO2 99 %. No results found.  Recent Labs  10/21/15 0606  WBC 5.5  HGB 8.7*  HCT 27.2*  PLT 235    Recent Labs  10/21/15 0606  NA 138  K 4.0  CL 102  GLUCOSE 88  BUN 16  CREATININE 1.02  CALCIUM 8.7*   CBG (last 3)  No results for input(s): GLUCAP in the last 72 hours.  Wt Readings from Last 3 Encounters:  10/16/15 74.3 kg (163 lb 12.8 oz)  10/10/15 78.291 kg (172 lb 9.6 oz)  06/21/15 76.522 kg (168 lb 11.2 oz)    Physical Exam:  BP 124/55 mmHg  Pulse 68  Temp(Src) 98.6 F (37 C) (Oral)  Resp 18  Ht 5' 6.5" (1.689 m)  Wt 74.3 kg (163 lb 12.8 oz)  BMI 26.05 kg/m2  SpO2 99% Constitutional: He appears well-developed and well-nourished.  HENT: Normocephalic and atraumatic.  Eyes: Sclera slightly icteric and injected. EOM are normal.  Cardiovascular: Normal rate and regular rhythm.  Respiratory: Effort normal and breath sounds normal. No respiratory distress. GI: Soft. Bowel sounds are normal. He exhibits no distension.  Musculoskeletal: He exhibits no edema or tenderness.  Neurological: He is alert.  Motor: b/l UE 4+/5, B/l LE 5/5 (right slightly stronger than left)  Mild ataxia LUE>RUE  (continues to improving) Skin: Skin is warm and dry.  Psychiatric: He has a normal mood and affect. His behavior is normal  Assessment/Plan: 1. Functional deficits secondary to right parietal occipital lobe infarct which require 3+ hours per day of interdisciplinary therapy  in a comprehensive inpatient rehab setting. Physiatrist is providing close team supervision and 24 hour management of active medical problems listed below. Physiatrist and rehab team continue to assess barriers to discharge/monitor patient progress toward functional and medical goals.  Function:  Bathing Bathing position   Position: Shower  Bathing parts Body parts bathed by patient: Right arm, Left arm, Chest, Abdomen, Front perineal area, Buttocks, Right upper leg, Left upper leg, Right lower leg, Left lower leg Body parts bathed by helper: Back  Bathing assist Assist Level: Supervision or verbal cues      Upper Body Dressing/Undressing Upper body dressing   What is the patient wearing?: Button up shirt, Pull over shirt/dress     Pull over shirt/dress - Perfomed by patient: Thread/unthread right sleeve, Thread/unthread left sleeve, Put head through opening, Pull shirt over trunk   Button up shirt - Perfomed by patient: Thread/unthread right sleeve, Thread/unthread left sleeve, Pull shirt around back, Button/unbutton shirt      Upper body assist Assist Level: Set up, Supervision or verbal cues   Set up : To obtain clothing/put away  Lower Body Dressing/Undressing Lower body dressing   What is the patient wearing?: Underwear, Pants, Non-skid slipper socks, Ted Hose Underwear - Performed by patient: Thread/unthread right underwear leg, Thread/unthread left underwear leg, Pull underwear up/down   Pants- Performed by patient: Thread/unthread right pants leg, Thread/unthread left  pants leg, Pull pants up/down, Fasten/unfasten pants   Non-skid slipper socks- Performed by patient: Don/doff right sock, Don/doff left sock   Socks - Performed by patient: Don/doff right sock, Don/doff left sock   Shoes - Performed by patient: Don/doff right shoe, Don/doff left shoe, Fasten right, Fasten left         TED Hose - Performed by helper: Don/doff right TED hose, Don/doff left TED hose   Lower body assist Assist for lower body dressing: Supervision or verbal cues   Set up : To obtain clothing/put away, Don/doff TED stockings  Toileting Toileting Toileting activity did not occur: N/A Toileting steps completed by patient: Adjust clothing prior to toileting, Performs perineal hygiene, Adjust clothing after toileting   Toileting Assistive Devices: Grab bar or rail  Toileting assist Assist level: Supervision or verbal cues   Transfers Chair/bed transfer   Chair/bed transfer method: Ambulatory Chair/bed transfer assist level: Supervision or verbal cues Chair/bed transfer assistive device: Armrests, Medical sales representative     Max distance: 140 Assist level: Supervision or verbal cues   Wheelchair Wheelchair activity did not occur: N/A        Cognition Comprehension Comprehension assist level: Understands basic 90% of the time/cues < 10% of the time  Expression Expression assist level: Expresses basic 90% of the time/requires cueing < 10% of the time.  Social Interaction Social Interaction assist level: Interacts appropriately 90% of the time - Needs monitoring or encouragement for participation or interaction.  Problem Solving Problem solving assist level: Solves basic 75 - 89% of the time/requires cueing 10 - 24% of the time  Memory Memory assist level: Recognizes or recalls 90% of the time/requires cueing < 10% of the time    Medical Problem List and Plan: 1. Left sided weakness secondary to right parietal occipital lobe infarct on 3/6  DC today  Will see as outpt for transitional care management 2. DVT Prophylaxis/Anticoagulation: Eliquis 3. Pain Management: Oxycodone as needed 4. Symptomatic right carotid stenosis. 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 Follow-up vascular surgery as needed 5. Neuropsych: This patient is capable of making decisions on his own behalf. 6. Skin/Wound Care: Routine skin checks 7.  Fluids/Electrolytes/Nutrition: Routine I&O   Hypokalemia: Resolved after supplementation - 4.0 on 3/20 8. Hypertension/PAF. Norvasc 10 mg daily, Lasix 20 mg daily.   Coreg 3.125 mg twice a day, increased to 6.25 on 3/15  controlled  Cont PRN hydralizine (hasn't required from some time)  Will cont to monitor with increased mobility 9. Coronary artery disease. No chest pain or shortness of breath. Patient currently remains on low dose aspirin 10. Hyperlipidemia. Lipitor/zetia 11. BPH. Flomax 0.8 mg daily now.  12. ABLA  Hb 8.7 on 3/20  Will have pt follow up as outpt, as values have gradually trended down 13. Urinary retention likely secondary to BPH  increased Flomax on 3/17  LOS (Days) 7 A FACE TO FACE EVALUATION WAS PERFORMED  Ankit Lorie Phenix 10/21/2015 8:34 AM

## 2015-10-21 NOTE — Progress Notes (Signed)
Physical Therapy Discharge Summary  Patient Details  Name: [name] MRN: [id number] Date of Birth: 1941-01-10  Today's Date: 10/21/2015 PT Individual Time: 0903-0958 PT Individual Time Calculation (min): 55 min    Patient has met 3 of 10 long term goals due to improved activity tolerance and increased strength.  Patient to discharge at an ambulatory level Supervision.   Patient's care partner unavailable to provide the necessary cognitive assistance at discharge.  Reasons goals not met: Pt with very poor safety awareness, decreased balance, and decreased carryover of safety instructions. Pt continues to remain at supervision level and PT informed pt of this.   Recommendation:  Patient will benefit from ongoing skilled PT services in home health setting, if insurance approves, to continue to advance safe functional mobility, address ongoing impairments in balance, activity tolerance, safety with ambulation, and minimize fall risk.  Equipment: RW  Reasons for discharge: Pt appears to be at baseline level of function.  Pt made very limited progress towards functional goals, secondary to poor carryover of safety instructions and education.   Patient/family agrees with progress made and goals achieved: Yes. Pt recognizes that he is not at independent level of function and continues to have impaired balance.  PT Discharge Patient received ambulating in room with RW & agreeable to PT. Pt ambulated room>gym with RW & supervision. Pt unable to progress towards mod I level with gait 2/2 impaired balance during gait, intermittent decreased L foot clearance (pt able to clear foot & increase heel strike with verbal cuing), pt continuing to not ambulate within base of RW & continuing to look down at floor instead of at environment. PT provided cuing on safety with ambulation and technique with RW. Pt only able to demonstrate LLE heel strike intermittently then will begin dragging foot again. Pt able  to transfer in/out of car with supervision A, negotiate 12 steps with L railing with Mod I & reciprocal gait, and ambulate over uneven surface with RW & supervision with cuing for proper use of RW. Pt educated on falls at home and situations during which he should call EMS. PT educated pt on this twice and pt unable to verbalize information back to PT immediately after being taught. Pt performed floor transfer for two trials with Min A and CGA respectively. PT educated pt on need to square up to furniture to have stable surface to push to standing. PT educated pt on need for family/friend/church member to provide some level of supervision for patient upon initial d/c home but pt reported this was not possible. Pt completed TUG in 32 seconds with RW, which is a 7 second improvement from last testing trial. At end of session pt ambulated back to room & left in recliner with all needs met/within reach.  PT Discharge Pain Pain Assessment Pain Assessment: No/denies pain Vision/Perception    Wears glasses Cognition Safety/Judgment: Impaired  Mobility Bed Mobility Bed Mobility: Rolling Right;Rolling Left;Supine to Sit;Sit to Supine Rolling Right: 6: Modified independent (Device/Increase time) Rolling Left: 6: Modified independent (Device/Increase time) Supine to Sit: 6: Modified independent (Device/Increase time) Sitting - Scoot to Edge of Bed: 6: Modified independent (Device/Increase time) Sit to Supine: 6: Modified independent (Device/Increase time) Transfers Transfers: Yes (pt requires cuing for proper hand placement for all transfers) Sit to Stand: 5: Supervision Sit to Stand Details: Verbal cues for sequencing;Verbal cues for technique Stand to Sit: 5: Supervision Stand Pivot Transfers: 5: Supervision Stand Pivot Transfer Details: Verbal cues for sequencing;Verbal cues for technique Locomotion  Ambulation Ambulation: Yes Ambulation/Gait Assistance: 5: Supervision Ambulation Distance  (Feet): 300 Feet Assistive device: Rolling walker Ambulation/Gait Assistance Details: Verbal cues for precautions/safety Gait Gait: Yes Gait Pattern: Decreased dorsiflexion - left;Poor foot clearance - left Stairs / Additional Locomotion Stairs: Yes Stairs Assistance: 6: Modified independent (Device/Increase time) Stair Management Technique: One rail Left Number of Stairs: 12 Height of Stairs: 6 Wheelchair Mobility Wheelchair Mobility: No     Balance Standardized Balance Assessment Standardized Balance Assessment: Timed Up and Go Test Timed Up and Go Test TUG: Normal TUG Normal TUG (seconds): 32 Extremity Assessment      RLE Assessment RLE Assessment: Within Functional Limits LLE Assessment LLE Assessment: Within Functional Limits   See Function Navigator for Current Functional Status.  [name] 10/21/2015, 5:43 PM

## 2015-10-21 NOTE — Progress Notes (Signed)
Social Work Patient ID: Jeannetta Ellis, male   DOB: 1941-07-03, 75 y.o.   MRN: [id number] Ulice Brilliant due to left a message for this worker, regarding plan for today. Informed her pt is being discharged today and that it would be good for someone to be with him for The first couple of days to make sure transition is going smoothly. Discussed at times he drags his leg. She reports he was doing this before from his last stroke. She can check on him but she does not stay with him. She reports: " He does what he wants to do."  She will be here around 12:00 to transport him home. She is aware he can not drive upon discharge, she reports she can transport him to appointments.

## 2015-10-21 NOTE — Progress Notes (Signed)
Occupational Therapy Session Note  Patient Details  Name: [name] MRN: [id number] Date of Birth: 02/23/1941  Today's Date: 10/21/2015 OT Individual Time: [id number] OT Individual Time Calculation (min): 60 min    Short Term Goals: Week 1:  OT Short Term Goal 1 (Week 1): STG=LTG d/t short LOS  Skilled Therapeutic Interventions/Progress Updates: ADL-retraining with focus on shower level BADL, re-ed on safety awareness during dynamic standing balance and discharge planning.   Pt received asleep in bed with breakfast tray present and untouched.   Pt aroused with multimodal cues and oriented to final visit planned with intent to perform BADL, mobility, and transfers during review of discharge disposition.   Pt rises to EOB unassisted and transfers to w/c with vc to remind him of catheter placement and setup to provide RW.   Pt ignores walker and performs squat pivot transfer to w/c, self-propels to sink and grooms before returnignng to over bed table to self-feed.   After setup to provide supplies pt is reminded to remove condom catheter but protests stating he wants to keep it on until discharge despite repeated cues to remove for safety and hygiene.   Pt ultimately complies and performs transfer to tub bench using RW.  Pt showers sitting and standing but requests assist to thoroughly scrub his back this session d/t irritation (not observed during skin inspection by OT).   Pt dries himself on bench and ambulates to EOB to dress after setup to place clothing for expedience d/t prolonged argument over catheter and extra time used for self-feeding.   Pt recovers to recliner at end of session with all needs placed within reach.   During session pt is frequently advised to sit while doffing and donning clothing while standing on only one leg (he is able to alternate legs w/o incident) d/t awkward posture and potential fall risk.  Pt dismisses OT directions and resumes one-legged standing stating he is able  to manage task as he has prior to stroke without need to sit.   During discharge planning pt also states that he has no caregiver available to assist him and immediately contradicts this when questioned about his relationship with significant other "[name]" allegedly a friend who visits him daily that he has known for 60 years.   OT is unable to extract meaningful dialogue from pt about assistance recommended for iADL d/t pt's intentional ambiguity.      Therapy Documentation Precautions: ambi Precautions Precautions: Fall Restrictions Weight Bearing Restrictions: No  Vital Signs: Therapy Vitals Temp: 98.6 F (37 C) Temp Source: Oral Pulse Rate: 68 Resp: 18 BP: (!) 124/55 mmHg Patient Position (if appropriate): Lying Oxygen Therapy SpO2: 99 % O2 Device: Not Delivered   Pain: No/denies pain   ADL: ADL ADL Comments: see Functional Assessment Tool    See Function Navigator for Current Functional Status.   Therapy/Group: Individual Therapy  [name] 10/21/2015, 9:19 AM

## 2015-10-21 NOTE — Discharge Summary (Signed)
[name], [name] NO.:  [id number]  MEDICAL RECORD NO.:  [id number]  LOCATION:  4M06C                        FACILITY:  [facility]  PHYSICIAN:  Lauraine Rinne, P.A.  DATE OF BIRTH:  09-29-1940  DATE OF ADMISSION:  10/14/2015 DATE OF DISCHARGE:  10/21/2015                              DISCHARGE SUMMARY   DISCHARGE DIAGNOSES: 1. 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 Deep venous thrombosis prophylaxis. 3. Pain management. 4. 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 5. Hypokalemia resolved. 6. Hypertension with platelet-activating factor. 7. Coronary artery disease. 8. Hyperlipidemia. 9. Benign prostatic hypertrophy. 10.Acute blood loss anemia.  HISTORY OF PRESENT ILLNESS:  This is a 75 year old right-handed male with history of hypertension, hyperlipidemia, coronary artery disease maintained on aspirin as well as PAF.  He lives alone independent prior to admission and still driving presented on October 07, 2015 with left- sided weakness.  Blood pressure 220/100.  Troponin 0.02.  MRI of the brain showed patchy acute ischemic nonhemorrhagic infarct involving the cortical gray matter and deep white matter of the right frontal parietal and occipital lobe.  Echocardiogram with ejection fraction of 60%. Normal systolic function.  The patient did not receive tPA.  CTA of the head and neck showed stenosis at the right RCA origin related to low- density plaque or thrombus.  Superimposed severe intracranial atherosclerotic type changes including high-grade distal right RCA stenosis.  Cerebral angiogram showed preocclusive stenosis, right RCA proximal 80% stenosis, right RCA supraclinoid segment, 50% cavernous segment.  Vascular Surgery consulted, underwent right carotid enterectomy October 10, 2015, per Dr. Trula Slade.  Neurology consulted, maintained on Eliquis as well as low-dose aspirin.   Physical and occupational therapy ongoing.  The patient was admitted for comprehensive rehab program.  PAST MEDICAL HISTORY:  See discharge diagnoses.  SOCIAL HISTORY:  Lives with grandson independent prior to admission. Functional status upon admission to rehab services, minimal assist ambulate 100 feet, 1-person handheld assistance.  Minimal assist sit to stand, min to mod assist activities of daily living.  PHYSICAL EXAMINATION:  VITAL SIGNS: Blood pressure 174/85, pulse 75, temperature 97, respirations 20. GENERAL: This was an alert male, mood was flat, but appropriate.  Made good eye contact with examiner.  Some delay in processing.  Appropriate for name, age, and place. LUNGS: Clear to auscultation without wheeze. CARDIAC:  Regular rate and rhythm.  No murmur. ABDOMEN: Soft, nontender.  Good bowel sounds.  REHABILITATION HOSPITAL COURSE:  The patient was admitted to inpatient rehab services with therapies initiated on a 3-hour daily basis, consisting of physical therapy, occupational therapy, and rehabilitation nursing.  The following issues were addressed during the patient's rehabilitation stay.  Pertaining to Mr. Robare right parietal occipital lobe infarct remained stable, he would follow up Neurology Services.  He continued on Eliquis status post right carotid endarterectomy October 10, 2015, per Vascular Surgery Dr. Trula Slade. Surgical site healing nicely.  Follow up outpatient.  Blood pressures controlled on Norvasc, Coreg, as well as low-dose Lasix, no orthostatic changes.  Arrangements made to follow up with primary MD.  He had no chest pain or shortness of breath.  Noted  benign prostatic hypertrophy. Maintained on Flomax 0.8 mg daily.  He was encouraged to stand to void, postvoid residuals a greatly improved.  The patient received weekly collaborative interdisciplinary team conferences to discuss estimated length of stay, family teaching, any barriers to discharge.   Patient transferred out of bed, ambulated from room to rehab apartment with rolling walker, supervision.  He attempted ambulating without assistive device.  Demonstrated lateral sway occasional scissoring gait.  He ambulated rolling walker remainder sessions educated on decreased balance and safety.  Transferred supine to sit supervision.  Completed BERG balance scale 41/56.  The patient negotiated stairs supervision. He could gather his belongings for activities of daily living and homemaking.  He would sit to do most of his dressing of lower bodies. Full family teaching was completed. Patient was made modified independent in his room. It was discussed with the family and patient the need for supervision for his safety however patient declined to stay with his daughter in [place] and refused nursing home. Plan, discharged to home with ongoing therapies dictated per [facility].  DISCHARGE MEDICATIONS: 1. Norvasc 10 mg p.o. daily. 2. Eliquis 5 mg p.o. b.i.d. 3. Aspirin 81 mg p.o. daily. 4. Lipitor 80 mg p.o. daily 5. Coreg 6.25 mg p.o. b.i.d. 6. Colace 100 mg p.o. daily. 7. Zetia 10 mg p.o. daily. 8. Lasix 20 mg p.o. daily. 9. Oxycodone immediate release 5-10 mg every 6 hours as needed pain,     dispense of 90 tablets. 10.Protonix 40 mg p.o. daily. 11.Flomax 0.8 mg p.o. after supper. 12. Imdur 30mg  a day DIET:  His diet was regular.  Special Instruction:Check CBC 3/27 2017 results to Dr Posey Pronto [phone]  FOLLOWUP:  He would follow up with Dr. Delice Lesch Office to call for appointment; Dr. Harold Barban, Vascular Surgery every 2 weeks to call for appointment; Dr. Erlinda Hong Neurology Services call for appointment 2 months, Dr. Cathlean Cower Medical Management October 28, 2015.     Lauraine Rinne, P.A.     DA/MEDQ  D:  10/21/2015  T:  10/21/2015  Job:  [id number]  cc:   Delice Lesch, MD V. Leia Alf, MD Gust Rung, M.D. Biagio Borg, MD

## 2015-10-21 NOTE — Plan of Care (Signed)
Problem: RH Bed to Chair Transfers Goal: LTG Patient will perform bed/chair transfers w/assist (PT) LTG: Patient will perform bed/chair transfers with assistance, with/without cues (PT).  Outcome: Not Met (add Reason) Poor safety awareness  Problem: RH Car Transfers Goal: LTG Patient will perform car transfers with assist (PT) LTG: Patient will perform car transfers with assistance (PT).  Outcome: Not Met (add Reason) 2/2 poor safety awareness  Problem: RH Furniture Transfers Goal: LTG Patient will perform furniture transfers w/assist (OT/PT LTG: Patient will perform furniture transfers with assistance (OT/PT).  Outcome: Not Met (add Reason) 2/2 poor safety awareness  Problem: RH Floor Transfers Goal: LTG Patient will perform floor transfers w/assist (PT) LTG: Patient will perform floor transfers with assistance (PT).  Outcome: Not Met (add Reason) Poor safety awareness     Problem: RH Ambulation Goal: LTG Patient will ambulate in controlled environment (PT) LTG: Patient will ambulate in a controlled environment, # of feet with assistance (PT).  Outcome: Not Met (add Reason) With RW, supervision 2/2 poor safety awareness/unsteadiness Goal: LTG Patient will ambulate in home environment (PT) LTG: Patient will ambulate in home environment, # of feet with assistance (PT).  Outcome: Not Met (add Reason) 2/2 poor safety awareness/unsteady, with RW Goal: LTG Patient will ambulate in community environment (PT) LTG: Patient will ambulate in community environment, # of feet with assistance (PT).  Outcome: Not Met (add Reason) With RW, poor safety awareness  Problem: RH Stairs Goal: LTG Patient will ambulate up and down stairs w/assist (PT) LTG: Patient will ambulate up and down # of stairs with assistance (PT)  Outcome: Completed/Met Date Met:  10/21/15 12 steps with L rail

## 2015-10-21 NOTE — Progress Notes (Signed)
Patient discharged to home accompanied by Dayna Barker (friend).

## 2015-10-21 NOTE — Progress Notes (Signed)
Social Work  Discharge Note  The overall goal for the admission was met for:   Discharge location: Yes-HOME WITH INTERMITTENT ASSIST FROM FRIENDS  Length of Stay: Yes-7 DAYS  Discharge activity level: Yes-MOD/I-SUPERVISION LEVEL  Home/community participation: Yes  Services provided included: MD, RD, PT, OT, SLP, RN, CM, TR, Pharmacy and SW  Financial Services: Private Insurance: [facility]  Follow-up services arranged: Home Health: [facility]-PT,OT,RN, DME: [place] and Patient/Family has no preference for HH/DME agencies  Comments (or additional information):PT CHOOSING TO WEAR A CONDOM [place], FEELS LESS DIGNIFIED. CONNIE-FRIEND TO CHECK FREQUENTLY ON HIM AT HOME BUT WILL NOT BE STAYING WITH HIM. CONNIE WILL PROVIDE TRANSPORTATION TO APPOINTMENTS AND ERRANDS, AWARE HE CAN NOT DRIVE. PT WAS NOT AGREEABLE TO PURSUING NHP BUT FELT HE WOULD MANAGE AT HOME.    Patient/Family verbalized understanding of follow-up arrangements: Yes  Individual responsible for coordination of the follow-up plan: SELF & CONNIE-FREIND  Confirmed correct DME delivered: Elease Hashimoto 10/21/2015    Elease Hashimoto

## 2015-10-21 NOTE — Discharge Summary (Signed)
Discharge summary job [id number]

## 2015-10-21 NOTE — Progress Notes (Signed)
Occupational Therapy Discharge Summary  Patient Details  Name: [name] MRN: [id number] Date of Birth: 07/03/1941  Patient has met 18 of 14 long term goals due to improved activity tolerance, ability to compensate for deficits and improved attention.  Patient to discharge at overall Modified Independent level for BADL and supervision for iADL.    Reasons goals not met: Difficult to assess awareness deficits as pt routinely dismisses corrective techniques provided.  Recommendation:  Patient will benefit from ongoing skilled OT in [place] to continue to advance functional skills in the area of iADL, safety awareness and memory.  Equipment: Shower chair  Reasons for discharge: treatment goals met  Patient/family agrees with progress made and goals achieved: Yes  OT Discharge Precautions/Restrictions  Precautions Precautions: Fall Restrictions Weight Bearing Restrictions: No Pain Pain Assessment Pain Assessment: No/denies pain ADL ADL ADL Comments: Functional Assessment Tool Vision/Perception  Vision- History Baseline Vision/History: Wears glasses Wears Glasses: At all times Patient Visual Report: No change from baseline Vision- Assessment Eye Alignment: Within Functional Limits Tracking/Visual Pursuits: Able to track stimulus in all quads without difficulty Visual Fields: No apparent deficits  Cognition Overall Cognitive Status: Within Functional Limits for tasks assessed Arousal/Alertness: Awake/alert Orientation Level: Oriented X4 Attention: Sustained Sustained Attention: Appears intact Memory: Impaired Memory Impairment: Decreased recall of new information Awareness: Impaired Awareness Impairment: Anticipatory impairment Problem Solving: Appears intact Behaviors: Impulsive Safety/Judgment: Impaired Sensation Sensation Light Touch: Appears Intact Stereognosis: Appears Intact Hot/Cold: Appears Intact Proprioception: Appears  Intact Coordination Gross Motor Movements are Fluid and Coordinated: Yes Fine Motor Movements are Fluid and Coordinated: Yes Motor  Motor Motor: Within Functional Limits Mobility  Bed Mobility Bed Mobility: Rolling Right;Rolling Left;Supine to Sit;Sit to Supine Rolling Right: 6: Modified independent (Device/Increase time) Rolling Left: 6: Modified independent (Device/Increase time) Right Sidelying to Sit: 6: Modified independent (Device/Increase time) Supine to Sit: 6: Modified independent (Device/Increase time) Sitting - Scoot to Edge of Bed: 6: Modified independent (Device/Increase time) Sit to Supine: 6: Modified independent (Device/Increase time) Transfers Transfers: Sit to Stand;Stand to Sit Sit to Stand: 6: Modified independent (Device/Increase time) Sit to Stand Details: Verbal cues for sequencing;Verbal cues for technique Stand to Sit: 6: Modified independent (Device/Increase time)  Trunk/Postural Assessment  Cervical Assessment Cervical Assessment: Within Functional Limits Thoracic Assessment Thoracic Assessment: Within Functional Limits Lumbar Assessment Lumbar Assessment: Within Functional Limits Postural Control Postural Control: Within Functional Limits  Balance Balance Balance Assessed: Yes Standardized Balance Assessment Standardized Balance Assessment: Timed Up and Go Test Berg Balance Test Sit to Stand: Able to stand without using hands and stabilize independently Standing Unsupported: Able to stand safely 2 minutes Sitting with Back Unsupported but Feet Supported on Floor or Stool: Able to sit safely and securely 2 minutes Stand to Sit: Sits safely with minimal use of hands Transfers: Able to transfer safely, definite need of hands Standing Unsupported with Eyes Closed: Able to stand 10 seconds safely Standing Ubsupported with Feet Together: Able to place feet together independently but unable to hold for 30 seconds From Standing, Reach Forward with  Outstretched Arm: Can reach forward >12 cm safely (5") From Standing Position, Pick up Object from Floor: Able to pick up shoe safely and easily From Standing Position, Turn to Look Behind Over each Shoulder: Looks behind from both sides and weight shifts well Turn 360 Degrees: Needs close supervision or verbal cueing Standing Unsupported, Alternately Place Feet on Step/Stool: Able to complete 4 steps without aid or supervision Standing Unsupported, One Foot in Front: Able  to take small step independently and hold 30 seconds Standing on One Leg: Unable to try or needs assist to prevent fall Total Score: 41 Timed Up and Go Test TUG: Normal TUG Normal TUG (seconds): 32 Extremity/Trunk Assessment RUE Assessment RUE Assessment: Within Functional Limits LUE Assessment LUE Assessment: Within Functional Limits   See Function Navigator for Current Functional Status.  Salome Spotted 10/21/2015, 7:04 PM

## 2015-10-22 ENCOUNTER — Telehealth: Payer: Self-pay | Admitting: Physical Medicine & Rehabilitation

## 2015-10-22 NOTE — Telephone Encounter (Signed)
Spoke with patients 1. Are you/is patient experiencing any problems since coming home? Are there any questions regarding any aspect of care? No problems 2. Are there any questions regarding medications administration/dosing? Are meds being taken as prescribed? Yes. His friend helped him get all his medications 3. Have there been any falls? Not yet 4. Has Home Health been to the house and/or have they contacted you? If not, have you tried to contact them? Can we help you contact them? So far everyone is coming that he knows of 5. Are bowels and bladder emptying properly? Are there any unexpected incontinence issues? If applicable, is patient following bowel/bladder programs? No 6. Any fevers, problems with breathing, unexpected pain? No 7. Are there any skin problems or new areas of breakdown? No 8. Has the patient/family member arranged specialty MD follow up (ie cardiology/neurology/renal/surgical/etc)?  Can we help arrange? No 9. Does the patient need any other services or support that we can help arrange? No  10. Are caregivers following through as expected in assisting the patient? He has two friends coming out to help him  F/u appointment scheduled for  10-31-15 at 1:30pm.

## 2015-10-25 ENCOUNTER — Encounter: Payer: Self-pay | Admitting: Surgery

## 2015-10-26 ENCOUNTER — Other Ambulatory Visit: Payer: Self-pay | Admitting: Internal Medicine

## 2015-10-28 ENCOUNTER — Encounter: Payer: Medicare Other | Admitting: Surgery

## 2015-10-28 ENCOUNTER — Ambulatory Visit: Payer: Medicare Other | Admitting: Internal Medicine

## 2015-10-28 DIAGNOSIS — I10 Essential (primary) hypertension: Secondary | ICD-10-CM | POA: Diagnosis not present

## 2015-10-28 DIAGNOSIS — I2510 Atherosclerotic heart disease of native coronary artery without angina pectoris: Secondary | ICD-10-CM | POA: Diagnosis not present

## 2015-10-28 DIAGNOSIS — E785 Hyperlipidemia, unspecified: Secondary | ICD-10-CM | POA: Diagnosis not present

## 2015-10-28 DIAGNOSIS — I69354 Hemiplegia and hemiparesis following cerebral infarction affecting left non-dominant side: Secondary | ICD-10-CM | POA: Diagnosis not present

## 2015-10-30 ENCOUNTER — Telehealth: Payer: Self-pay | Admitting: Internal Medicine

## 2015-10-30 DIAGNOSIS — E785 Hyperlipidemia, unspecified: Secondary | ICD-10-CM | POA: Diagnosis not present

## 2015-10-30 DIAGNOSIS — I10 Essential (primary) hypertension: Secondary | ICD-10-CM | POA: Diagnosis not present

## 2015-10-30 DIAGNOSIS — I2510 Atherosclerotic heart disease of native coronary artery without angina pectoris: Secondary | ICD-10-CM | POA: Diagnosis not present

## 2015-10-30 DIAGNOSIS — I69354 Hemiplegia and hemiparesis following cerebral infarction affecting left non-dominant side: Secondary | ICD-10-CM | POA: Diagnosis not present

## 2015-10-30 NOTE — Telephone Encounter (Signed)
Juliann Pulse, a physical therapist from [place] states pt is having signs of a UTI. He has a hospital f/u next week and she is wondering if you can put an order in for the nurse to do the labs Fax [phone]

## 2015-10-31 ENCOUNTER — Encounter: Payer: Medicare Other | Attending: Physical Medicine & Rehabilitation | Admitting: Physical Medicine & Rehabilitation

## 2015-10-31 DIAGNOSIS — N3941 Urge incontinence: Secondary | ICD-10-CM | POA: Diagnosis not present

## 2015-10-31 DIAGNOSIS — E785 Hyperlipidemia, unspecified: Secondary | ICD-10-CM | POA: Diagnosis not present

## 2015-10-31 DIAGNOSIS — R32 Unspecified urinary incontinence: Secondary | ICD-10-CM | POA: Diagnosis not present

## 2015-10-31 DIAGNOSIS — I69354 Hemiplegia and hemiparesis following cerebral infarction affecting left non-dominant side: Secondary | ICD-10-CM | POA: Diagnosis not present

## 2015-10-31 DIAGNOSIS — I10 Essential (primary) hypertension: Secondary | ICD-10-CM | POA: Diagnosis not present

## 2015-10-31 DIAGNOSIS — I2510 Atherosclerotic heart disease of native coronary artery without angina pectoris: Secondary | ICD-10-CM | POA: Diagnosis not present

## 2015-11-01 DIAGNOSIS — I2510 Atherosclerotic heart disease of native coronary artery without angina pectoris: Secondary | ICD-10-CM | POA: Diagnosis not present

## 2015-11-01 DIAGNOSIS — E785 Hyperlipidemia, unspecified: Secondary | ICD-10-CM | POA: Diagnosis not present

## 2015-11-01 DIAGNOSIS — I69354 Hemiplegia and hemiparesis following cerebral infarction affecting left non-dominant side: Secondary | ICD-10-CM | POA: Diagnosis not present

## 2015-11-01 DIAGNOSIS — I10 Essential (primary) hypertension: Secondary | ICD-10-CM | POA: Diagnosis not present

## 2015-11-04 DIAGNOSIS — I10 Essential (primary) hypertension: Secondary | ICD-10-CM | POA: Diagnosis not present

## 2015-11-04 DIAGNOSIS — I69354 Hemiplegia and hemiparesis following cerebral infarction affecting left non-dominant side: Secondary | ICD-10-CM | POA: Diagnosis not present

## 2015-11-04 DIAGNOSIS — I2510 Atherosclerotic heart disease of native coronary artery without angina pectoris: Secondary | ICD-10-CM | POA: Diagnosis not present

## 2015-11-04 DIAGNOSIS — E785 Hyperlipidemia, unspecified: Secondary | ICD-10-CM | POA: Diagnosis not present

## 2015-11-05 ENCOUNTER — Encounter (HOSPITAL_COMMUNITY): Payer: Medicare Other

## 2015-11-05 ENCOUNTER — Ambulatory Visit: Payer: Medicare Other | Admitting: Internal Medicine

## 2015-11-05 ENCOUNTER — Emergency Department (HOSPITAL_COMMUNITY): Payer: Medicare Other

## 2015-11-05 ENCOUNTER — Inpatient Hospital Stay (HOSPITAL_COMMUNITY)
Admission: EM | Admit: 2015-11-05 | Discharge: 2015-11-07 | DRG: 872 | Disposition: A | Discharge status: Skilled Nursing Facility | Payer: Medicare Other | Attending: Internal Medicine | Admitting: Internal Medicine

## 2015-11-05 DIAGNOSIS — Z9114 Patient's other noncompliance with medication regimen: Secondary | ICD-10-CM | POA: Diagnosis not present

## 2015-11-05 DIAGNOSIS — I63331 Cerebral infarction due to thrombosis of right posterior cerebral artery: Secondary | ICD-10-CM

## 2015-11-05 DIAGNOSIS — N390 Urinary tract infection, site not specified: Secondary | ICD-10-CM

## 2015-11-05 DIAGNOSIS — R410 Disorientation, unspecified: Secondary | ICD-10-CM | POA: Diagnosis not present

## 2015-11-05 DIAGNOSIS — I252 Old myocardial infarction: Secondary | ICD-10-CM

## 2015-11-05 DIAGNOSIS — N320 Bladder-neck obstruction: Secondary | ICD-10-CM | POA: Diagnosis present

## 2015-11-05 DIAGNOSIS — A419 Sepsis, unspecified organism: Secondary | ICD-10-CM | POA: Diagnosis not present

## 2015-11-05 DIAGNOSIS — D649 Anemia, unspecified: Secondary | ICD-10-CM | POA: Diagnosis not present

## 2015-11-05 DIAGNOSIS — Z7982 Long term (current) use of aspirin: Secondary | ICD-10-CM

## 2015-11-05 DIAGNOSIS — Z79891 Long term (current) use of opiate analgesic: Secondary | ICD-10-CM

## 2015-11-05 DIAGNOSIS — R7303 Prediabetes: Secondary | ICD-10-CM | POA: Diagnosis present

## 2015-11-05 DIAGNOSIS — N183 Chronic kidney disease, stage 3 unspecified: Secondary | ICD-10-CM | POA: Diagnosis present

## 2015-11-05 DIAGNOSIS — N138 Other obstructive and reflux uropathy: Secondary | ICD-10-CM | POA: Diagnosis present

## 2015-11-05 DIAGNOSIS — N401 Enlarged prostate with lower urinary tract symptoms: Secondary | ICD-10-CM | POA: Diagnosis present

## 2015-11-05 DIAGNOSIS — I10 Essential (primary) hypertension: Secondary | ICD-10-CM | POA: Diagnosis present

## 2015-11-05 DIAGNOSIS — R69 Illness, unspecified: Secondary | ICD-10-CM | POA: Diagnosis not present

## 2015-11-05 DIAGNOSIS — M79609 Pain in unspecified limb: Secondary | ICD-10-CM | POA: Diagnosis not present

## 2015-11-05 DIAGNOSIS — I482 Chronic atrial fibrillation: Secondary | ICD-10-CM | POA: Diagnosis not present

## 2015-11-05 DIAGNOSIS — I429 Cardiomyopathy, unspecified: Secondary | ICD-10-CM | POA: Diagnosis not present

## 2015-11-05 DIAGNOSIS — N179 Acute kidney failure, unspecified: Secondary | ICD-10-CM | POA: Diagnosis present

## 2015-11-05 DIAGNOSIS — Z79899 Other long term (current) drug therapy: Secondary | ICD-10-CM

## 2015-11-05 DIAGNOSIS — K219 Gastro-esophageal reflux disease without esophagitis: Secondary | ICD-10-CM | POA: Diagnosis not present

## 2015-11-05 DIAGNOSIS — E785 Hyperlipidemia, unspecified: Secondary | ICD-10-CM | POA: Diagnosis present

## 2015-11-05 DIAGNOSIS — I480 Paroxysmal atrial fibrillation: Secondary | ICD-10-CM | POA: Diagnosis present

## 2015-11-05 DIAGNOSIS — I69354 Hemiplegia and hemiparesis following cerebral infarction affecting left non-dominant side: Secondary | ICD-10-CM

## 2015-11-05 DIAGNOSIS — Z7901 Long term (current) use of anticoagulants: Secondary | ICD-10-CM

## 2015-11-05 DIAGNOSIS — T50904A Poisoning by unspecified drugs, medicaments and biological substances, undetermined, initial encounter: Secondary | ICD-10-CM | POA: Diagnosis not present

## 2015-11-05 DIAGNOSIS — Z7951 Long term (current) use of inhaled steroids: Secondary | ICD-10-CM | POA: Diagnosis not present

## 2015-11-05 DIAGNOSIS — I129 Hypertensive chronic kidney disease with stage 1 through stage 4 chronic kidney disease, or unspecified chronic kidney disease: Secondary | ICD-10-CM | POA: Diagnosis not present

## 2015-11-05 DIAGNOSIS — I2510 Atherosclerotic heart disease of native coronary artery without angina pectoris: Secondary | ICD-10-CM | POA: Diagnosis not present

## 2015-11-05 DIAGNOSIS — G8194 Hemiplegia, unspecified affecting left nondominant side: Secondary | ICD-10-CM | POA: Diagnosis present

## 2015-11-05 DIAGNOSIS — R000 Tachycardia, unspecified: Secondary | ICD-10-CM

## 2015-11-05 DIAGNOSIS — D509 Iron deficiency anemia, unspecified: Secondary | ICD-10-CM | POA: Diagnosis not present

## 2015-11-05 DIAGNOSIS — I4891 Unspecified atrial fibrillation: Secondary | ICD-10-CM | POA: Diagnosis present

## 2015-11-05 DIAGNOSIS — R262 Difficulty in walking, not elsewhere classified: Secondary | ICD-10-CM | POA: Diagnosis not present

## 2015-11-05 HISTORY — DX: Cardiac arrhythmia, unspecified: I49.9

## 2015-11-05 LAB — CBC WITH DIFFERENTIAL/PLATELET
Basophils Absolute: 0 10*3/uL (ref 0.0–0.1)
Basophils Relative: 0 %
Eosinophils Absolute: 0.1 10*3/uL (ref 0.0–0.7)
Eosinophils Relative: 1 %
HCT: 30.7 % — ABNORMAL LOW (ref 39.0–52.0)
Hemoglobin: 9.3 g/dL — ABNORMAL LOW (ref 13.0–17.0)
Lymphocytes Relative: 10 %
Lymphs Abs: 0.8 10*3/uL (ref 0.7–4.0)
MCH: 20 pg — ABNORMAL LOW (ref 26.0–34.0)
MCHC: 30.3 g/dL (ref 30.0–36.0)
MCV: 66.2 fL — ABNORMAL LOW (ref 78.0–100.0)
Monocytes Absolute: 0.7 10*3/uL (ref 0.1–1.0)
Monocytes Relative: 9 %
Neutro Abs: 6.7 10*3/uL (ref 1.7–7.7)
Neutrophils Relative %: 80 %
Platelets: 219 10*3/uL (ref 150–400)
RBC: 4.64 MIL/uL (ref 4.22–5.81)
RDW: 14.9 % (ref 11.5–15.5)
WBC: 8.3 10*3/uL (ref 4.0–10.5)

## 2015-11-05 LAB — COMPREHENSIVE METABOLIC PANEL
ALT: 11 U/L — ABNORMAL LOW (ref 17–63)
AST: 19 U/L (ref 15–41)
Albumin: 3.2 g/dL — ABNORMAL LOW (ref 3.5–5.0)
Alkaline Phosphatase: 49 U/L (ref 38–126)
Anion gap: 14 (ref 5–15)
BUN: 19 mg/dL (ref 6–20)
CO2: 24 mmol/L (ref 22–32)
Calcium: 9.3 mg/dL (ref 8.9–10.3)
Chloride: 100 mmol/L — ABNORMAL LOW (ref 101–111)
Creatinine, Ser: 1.45 mg/dL — ABNORMAL HIGH (ref 0.61–1.24)
GFR calc Af Amer: 54 mL/min — ABNORMAL LOW (ref >60)
GFR calc non Af Amer: 46 mL/min — ABNORMAL LOW (ref >60)
Glucose, Bld: 114 mg/dL — ABNORMAL HIGH (ref 65–99)
Potassium: 3.9 mmol/L (ref 3.5–5.1)
Sodium: 138 mmol/L (ref 135–145)
Total Bilirubin: 0.5 mg/dL (ref 0.3–1.2)
Total Protein: 8 g/dL (ref 6.5–8.1)

## 2015-11-05 LAB — URINALYSIS, ROUTINE W REFLEX MICROSCOPIC
Bilirubin Urine: NEGATIVE
Glucose, UA: NEGATIVE mg/dL
Ketones, ur: NEGATIVE mg/dL
Nitrite: NEGATIVE
Protein, ur: 30 mg/dL — AB
Specific Gravity, Urine: 1.007 (ref 1.005–1.030)
pH: 5.5 (ref 5.0–8.0)

## 2015-11-05 LAB — INFLUENZA PANEL BY PCR (TYPE A & B)
H1N1 flu by pcr: NOT DETECTED
Influenza A By PCR: NEGATIVE
Influenza B By PCR: NEGATIVE

## 2015-11-05 LAB — URINE MICROSCOPIC-ADD ON

## 2015-11-05 LAB — LACTIC ACID, PLASMA
LACTIC ACID, VENOUS: 0.8 mmol/L (ref 0.5–2.0)
LACTIC ACID, VENOUS: 1.3 mmol/L (ref 0.5–2.0)

## 2015-11-05 LAB — APTT: aPTT: 22 seconds — ABNORMAL LOW (ref 24–37)

## 2015-11-05 LAB — I-STAT CG4 LACTIC ACID, ED: Lactic Acid, Venous: 2.17 mmol/L (ref 0.5–2.0)

## 2015-11-05 LAB — PROTIME-INR
INR: 1.23 (ref 0.00–1.49)
PROTHROMBIN TIME: 15.7 s — AB (ref 11.6–15.2)

## 2015-11-05 LAB — TROPONIN I: Troponin I: 0.03 ng/mL (ref <0.031)

## 2015-11-05 MED ORDER — DEXTROSE 5 % IV SOLN: 2.0000 g | Freq: Once | INTRAVENOUS | Status: DC

## 2015-11-05 MED ORDER — PANTOPRAZOLE SODIUM 40 MG PO TBEC
40.0000 mg | DELAYED_RELEASE_TABLET | Freq: Every day | ORAL | Status: DC
  Administered 2015-11-05 – 2015-11-07 (×3): 40 mg via ORAL
  Filled 2015-11-05 (×2): qty 1

## 2015-11-05 MED ORDER — SODIUM CHLORIDE 0.9 % IV BOLUS (SEPSIS)
500.0000 mL | INTRAVENOUS | Status: AC
  Administered 2015-11-05: 500 mL via INTRAVENOUS

## 2015-11-05 MED ORDER — ALBUTEROL SULFATE (2.5 MG/3ML) 0.083% IN NEBU: 2.5000 mg | INHALATION_SOLUTION | Freq: Four times a day (QID) | RESPIRATORY_TRACT | Status: DC | PRN

## 2015-11-05 MED ORDER — DEXTROSE 5 % IV SOLN
2.0000 g | Freq: Once | INTRAVENOUS | Status: AC
  Administered 2015-11-05: 2 g via INTRAVENOUS
  Filled 2015-11-05: qty 2

## 2015-11-05 MED ORDER — ASPIRIN EC 81 MG PO TBEC: 81.0000 mg | DELAYED_RELEASE_TABLET | Freq: Every day | ORAL | Status: DC

## 2015-11-05 MED ORDER — SODIUM CHLORIDE 0.9 % IV SOLN
INTRAVENOUS | Status: DC
  Administered 2015-11-05 – 2015-11-06 (×2): via INTRAVENOUS

## 2015-11-05 MED ORDER — DEXTROSE 5 % IV SOLN: 2.0000 g | INTRAVENOUS | Status: DC

## 2015-11-05 MED ORDER — DEXTROSE 5 % IV SOLN: 2.0000 g | Freq: Two times a day (BID) | INTRAVENOUS | Status: DC

## 2015-11-05 MED ORDER — DEXTROSE 5 % IV SOLN
2.0000 g | INTRAVENOUS | Status: DC
  Administered 2015-11-06: 2 g via INTRAVENOUS
  Filled 2015-11-05 (×2): qty 2

## 2015-11-05 MED ORDER — ALBUTEROL SULFATE HFA 108 (90 BASE) MCG/ACT IN AERS: 2.0000 | INHALATION_SPRAY | Freq: Four times a day (QID) | RESPIRATORY_TRACT | Status: DC | PRN

## 2015-11-05 MED ORDER — ATORVASTATIN CALCIUM 80 MG PO TABS
80.0000 mg | ORAL_TABLET | Freq: Every day | ORAL | Status: DC
  Administered 2015-11-06 – 2015-11-07 (×2): 80 mg via ORAL
  Filled 2015-11-05 (×2): qty 1

## 2015-11-05 MED ORDER — EZETIMIBE 10 MG PO TABS
10.0000 mg | ORAL_TABLET | Freq: Every day | ORAL | Status: DC
  Administered 2015-11-06 – 2015-11-07 (×2): 10 mg via ORAL
  Filled 2015-11-05 (×2): qty 1

## 2015-11-05 MED ORDER — SENNOSIDES-DOCUSATE SODIUM 8.6-50 MG PO TABS: 1.0000 | ORAL_TABLET | Freq: Every evening | ORAL | Status: DC | PRN

## 2015-11-05 MED ORDER — SODIUM CHLORIDE 0.9 % IV BOLUS (SEPSIS)
1000.0000 mL | INTRAVENOUS | Status: AC
  Administered 2015-11-05 (×2): 1000 mL via INTRAVENOUS

## 2015-11-05 MED ORDER — APIXABAN 5 MG PO TABS
5.0000 mg | ORAL_TABLET | Freq: Two times a day (BID) | ORAL | Status: DC
  Administered 2015-11-05 – 2015-11-07 (×4): 5 mg via ORAL
  Filled 2015-11-05 (×4): qty 1

## 2015-11-05 MED ORDER — CARVEDILOL 3.125 MG PO TABS
3.1250 mg | ORAL_TABLET | Freq: Two times a day (BID) | ORAL | Status: DC
  Administered 2015-11-05 – 2015-11-07 (×4): 3.125 mg via ORAL
  Filled 2015-11-05 (×4): qty 1

## 2015-11-05 MED ORDER — TAMSULOSIN HCL 0.4 MG PO CAPS
0.4000 mg | ORAL_CAPSULE | Freq: Every day | ORAL | Status: DC
  Administered 2015-11-06: 0.4 mg via ORAL
  Filled 2015-11-05: qty 1

## 2015-11-05 NOTE — H&P (Signed)
[facility] History and Physical  [name] [id number] DOB: January 31, 1941 DOA: 11/05/2015   PCP: Cathlean Cower, MD    Chief Complaint: Sepsis  HPI: [name] is a 75 y.o. BM PMHx HTN, paroxysmal atrial fibrillation, CAD native artery, MI, HLD, recent CVA (residual left sided hemi-plegia), CKD stage III  Patient reportedly did not feel well this morning. He called his friend and told her that he just felt terrible and he needed her to come over. EMS was called as well. General symptom was dizziness and weakness with chills. Upon EMS arrival the patient was mildly hypotensive. Patient reportedly has been taking several medications incorrectly since discharge. He has been taking 2 tablets each of 20 mg Lasix , 0.4 mg Flomax and 80 mg Lipitor in the mornings when he was only supposed to be taking 1 of each. He was taking his evening medications appropriately of Coreg 6.25 and Imdur 30 mg. The patient has a fever. He reports he was not aware he had a fever but he has chills this morning. He reports his abdomen has felt slightly bloated. He also endorses burning with urination. His companion reports that she was called yesterday by home health and told that he had a urinary tract infection but has not yet been started on antibiotics. Patient has had nausea but he denies vomiting. He denies chest pain cough or dyspnea. He reports he gets pain in his right mid back/anterior thigh that has been occurring since he had a test done in the hospital. He reports it usually happens when he gets comfortable lying down and then all of a sudden his leg will hurt. He has not had swelling of his legs.  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 States was told by home health aide to take 8 tablets (unknown) medication. After taking medication became  disoriented. Currently A/O 4, NAD   Review of Systems: The patient denies anorexia, fever, weight loss,, vision loss, decreased hearing, hoarseness, chest pain, syncope, dyspnea on exertion, peripheral edema, hemoptysis, abdominal pain, melena, hematochezia, severe indigestion/heartburn, hematuria, incontinence, genital sores,  suspicious skin lesions, transient blindness,  depression, unusual weight change, abnormal bleeding, enlarged lymph nodes, angioedema, and breast masses.    TRAVEL HISTORY: NA   Consultants:  NA  Procedure/Significant Events:  3/6 echocardiogram;- Left ventricle: severe LVH. -LVEF= 55%-60%- Left atrium: moderately dilated.    Culture  4/4 urine pending 4/4 blood pending   Antibiotics:  Cefepime 4/4>>   DVT prophylaxis:  Eliquis  Devices     LINES / TUBES:     Past Medical History  Diagnosis Date  . Hypertension     echocardiogram 6/10: Moderate LVH, EF 55-65%, mild, mild MR, MAC, mild LAE, PASP 32  . Dyslipidemia   . History of stroke   . CAD (coronary artery disease)     cath in 1/08: EF 60%, mild dilated Ao root, oD2 30%, LAD 20-30%.  . Paroxysmal atrial fibrillation ([place])   . Anemia, iron deficiency 08/15/2011  . History of pneumonia 08/15/2011  . History of MI (myocardial infarction) 08/15/2011  . Dilated aortic root ([place]) 08/15/2011  . Stroke ([place]) 10/2011  . Myocardial infarction ([place])   . Hyperlipidemia   . Shortness of breath dyspnea   . Prediabetes 10/08/2015  . Preoperative cardiovascular examination 10/09/2015   Past Surgical History  Procedure Laterality Date  .  Cardiac catheterization    . Endarterectomy Right 10/10/2015    Procedure: ENDARTERECTOMY CAROTID-RIGHT;  Surgeon: Serafina Mitchell, MD;  Location: [facility] OR;  Service: Vascular;  Laterality: Right;   Social History:  reports that he has never smoked. He has never used smokeless tobacco. He reports that he does not drink alcohol or use illicit drugs. where does patient  live--home, ALF, SNF? and with whom if at home? Home alone Can patient participate in ADLs? Yes  No Known Allergies  Family History  Problem Relation Age of Onset  . Colon cancer Neg Hx   . Aneurysm Sister    Spoke with patient and patient stated no HTN, MI, HLD, Mental illness, CVA, Thyroid Dz or Cancer in their family history.       Prior to Admission medications   Medication Sig Start Date End Date Taking? Authorizing Provider  amLODipine (NORVASC) 10 MG tablet Take 1 tablet (10 mg total) by mouth daily. 10/21/15  Yes Daniel J Angiulli, PA-C  apixaban (ELIQUIS) 5 MG TABS tablet Take 1 tablet (5 mg total) by mouth 2 (two) times daily. 10/21/15  Yes Daniel J Angiulli, PA-C  atorvastatin (LIPITOR) 80 MG tablet Take 1 tablet (80 mg total) by mouth daily. 10/21/15  Yes Daniel J Angiulli, PA-C  carvedilol (COREG) 3.125 MG tablet Take 3.125 mg by mouth 2 (two) times daily. 10/31/15  Yes Historical Provider, MD  ezetimibe (ZETIA) 10 MG tablet Take 1 tablet (10 mg total) by mouth daily. 10/14/15  Yes Nishant Dhungel, MD  furosemide (LASIX) 20 MG tablet Take 1 tablet (20 mg total) by mouth daily. 10/21/15  Yes Daniel J Angiulli, PA-C  isosorbide mononitrate (IMDUR) 30 MG 24 hr tablet Take 1 tablet (30 mg total) by mouth daily. 10/21/15  Yes Daniel J Angiulli, PA-C  oxyCODONE (OXY IR/ROXICODONE) 5 MG immediate release tablet Take 1-2 tablets (5-10 mg total) by mouth every 6 (six) hours as needed for moderate pain. 10/21/15  Yes Daniel J Angiulli, PA-C  pantoprazole (PROTONIX) 40 MG tablet Take 1 tablet (40 mg total) by mouth daily. 10/21/15  Yes Daniel J Angiulli, PA-C  tamsulosin (FLOMAX) 0.4 MG CAPS capsule Take 1 capsule (0.4 mg total) by mouth daily after supper. 10/14/15  Yes Nishant Dhungel, MD  albuterol (PROVENTIL HFA;VENTOLIN HFA) 108 (90 BASE) MCG/ACT inhaler Inhale 2 puffs into the lungs every 6 (six) hours as needed for wheezing or shortness of breath. Patient not taking: Reported on 10/07/2015  06/21/15   Thurnell Lose, MD  aspirin EC 81 MG tablet Take 1 tablet (81 mg total) by mouth daily. Patient not taking: Reported on 10/07/2015 06/21/15   Thurnell Lose, MD  atorvastatin (LIPITOR) 80 MG tablet TAKE ONE TABLET BY MOUTH ONCE DAILY Patient not taking: Reported on 11/05/2015 10/28/15   Biagio Borg, MD  senna-docusate (SENOKOT-S) 8.6-50 MG tablet Take 1 tablet by mouth at bedtime as needed for mild constipation. Patient not taking: Reported on 11/05/2015 10/14/15   Louellen Molder, MD   Physical Exam: Filed Vitals:   11/05/15 1917 11/05/15 1930 11/05/15 1945 11/05/15 2104  BP:  136/90 140/84 148/66  Pulse:  91 84 80  Temp: 98.2 F (36.8 C)   98.4 F (36.9 C)  TempSrc: Oral   Oral  Resp:  24 16 18   Height:    5\' 6"  (1.676 m)  Weight:    71.215 kg (157 lb)  SpO2:  99% 99% 99%     General: A/O 4, NAD, No acute  respiratory distress Eyes: Negative headache, eye pain, double vision, scotomas, floaters, negative scleral hemorrhage ENT: Negative Runny nose, negative ear pain, negative tinnitus, negative gingival bleeding, negative odynophagia Neck:  Negative scars, masses, torticollis, lymphadenopathy, JVD Lungs: Clear to auscultation bilaterally without wheezes or crackles Cardiovascular: Irregular irregular rhythm and rate, without murmur gallop or rub normal S1 and S2 Abdomen:negative abdominal pain, negative dysphagia, Nontender, nondistended, soft, bowel sounds positive, no rebound, no ascites, no appreciable mass Extremities: No significant cyanosis, clubbing, or edema bilateral lower extremities Psychiatric:  Negative depression, negative anxiety, negative fatigue, negative mania  Neurologic:  Cranial nerves II through XII intact, tongue/uvula midline, all extremities muscle strength 5/5, sensation intact throughout, finger nose finger bilateral within normal limits, quick finger touch bilateral within normal limits, patient's left foot lacks fine motor coordination, negative  dysarthria, negative expressive aphasia, negative receptive aphasia.  Skin/Hemolytic/lymphatic; negative purpura, petechia,     Labs on Admission:  Basic Metabolic Panel:  Recent Labs Lab 11/05/15 1455  NA 138  K 3.9  CL 100*  CO2 24  GLUCOSE 114*  BUN 19  CREATININE 1.45*  CALCIUM 9.3   Liver Function Tests:  Recent Labs Lab 11/05/15 1455  AST 19  ALT 11*  ALKPHOS 49  BILITOT 0.5  PROT 8.0  ALBUMIN 3.2*   No results for input(s): LIPASE, AMYLASE in the last 168 hours. No results for input(s): AMMONIA in the last 168 hours. CBC:  Recent Labs Lab 11/05/15 1455  WBC 8.3  NEUTROABS 6.7  HGB 9.3*  HCT 30.7*  MCV 66.2*  PLT 219   Cardiac Enzymes:  Recent Labs Lab 11/05/15 1911  TROPONINI 0.03    BNP (last 3 results)  Recent Labs  06/19/15 1039  BNP 301.8*    ProBNP (last 3 results) No results for input(s): PROBNP in the last 8760 hours.  CBG: No results for input(s): GLUCAP in the last 168 hours.  Radiological Exams on Admission: Dg Chest 2 View  11/05/2015  CLINICAL DATA:  Urinary tract infection, confusion EXAM: CHEST  2 VIEW COMPARISON:  06/20/2015 FINDINGS: Uncoiling of the aorta and cardiac enlargement, mild, both stable. Vascular pattern is normal. Lungs are clear. No pleural effusions. IMPRESSION: No active cardiopulmonary disease. Electronically Signed   By: Skipper Cliche M.D.   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)   Sepsis secondary to UTI [facility])   UTI (lower urinary tract infection)   CAD in native artery   Left hemiplegia (HCC)   Sinus tachycardia (HCC)  Sepsis secondary to  UTI -Continue current antibiotics -Urine, blood culture pending -Normal saline 125 ml/hr  Cerebral infarction posterior cerebral artery -Residual left hemiplegia resolved except for left foot lack of fine motor coordination will need PT/OT consult prior to discharge  Paroxysmal atrial fibrillation/Cardiomyopathy -Eliquis 5 mg BID -See hypertension -Strict in and out -Daily weight  Sinus Tachycardia/Abnormal EKG -Changes in leads V5, V6 may be just secondary sinus tachycardia, however will obtain troponin 3 -Would consider echocardiogram if patient symptomatic  Hypertension -Hold all BP medication except Coreg 3.125 mg BID  CAD native artery   Acute renal failure (baseline Cr= 1)  -See  sepsis  Dyslipidemia -Lipitor 80 mg daily     Code Status: Full Family Communication: None present  Disposition Plan: Resolution sepsis   Care during the described time interval was provided by me .  I have reviewed this patient's available data, including medical history, events of note, physical examination, and all test results as part of my evaluation. I have personally reviewed and interpreted all radiology studies.    Time spent: 60 minutes  Allie Bossier [facility] Pager [phone]  If 7PM-7AM, please contact night-coverage [email or web address] Password [email or web address] 11/05/2015, 9:21 PM

## 2015-11-05 NOTE — ED Provider Notes (Signed)
CSN: [id number]     Arrival date & time 11/05/15  1438 History   First MD Initiated Contact with Patient 11/05/15 1458     Chief Complaint  Patient presents with  . Code Sepsis     (Consider location/radiation/quality/duration/timing/severity/associated sxs/prior Treatment) HPI Patient reportedly did not feel well this morning. He called his friend and told her that he just felt terrible and he needed her to come over. EMS was called as well. General symptom was dizziness and weakness with chills. Upon EMS arrival the patient was mildly hypotensive. Patient reportedly has been taking several medications incorrectly since discharge. He has been taking 2 tablets each of 20 mg Lasix , 0.4 mg Flomax and 80 mg Lipitor in the mornings when he was only supposed to be taking 1 of each. He was taking his evening medications appropriately of Coreg 6.25 and Imdur 30 mg. The patient has a fever. He reports he was not aware he had a fever but he has chills this morning. He reports his abdomen has felt slightly bloated. He also endorses burning with urination. His companion reports that she was called yesterday by home health and told that he had a urinary tract infection but has not yet been started on antibiotics. Patient has had nausea but he denies vomiting. He denies chest pain cough or dyspnea. He reports he gets pain in his right mid back/anterior thigh that has been occurring since he had a test done in the hospital. He reports it usually happens when he gets comfortable lying down and then all of a sudden his leg will hurt. He has not had swelling of his legs. Past Medical History  Diagnosis Date  . Hypertension     echocardiogram 6/10: Moderate LVH, EF 55-65%, mild, mild MR, MAC, mild LAE, PASP 32  . Dyslipidemia   . History of stroke   . CAD (coronary artery disease)     cath in 1/08: EF 60%, mild dilated Ao root, oD2 30%, LAD 20-30%.  . Paroxysmal atrial fibrillation ([place])   . Anemia, iron  deficiency 08/15/2011  . History of pneumonia 08/15/2011  . History of MI (myocardial infarction) 08/15/2011  . Dilated aortic root ([place]) 08/15/2011  . Stroke ([place]) 10/2011  . Myocardial infarction ([place])   . Hyperlipidemia   . Shortness of breath dyspnea   . Prediabetes 10/08/2015  . Preoperative cardiovascular examination 10/09/2015   Past Surgical History  Procedure Laterality Date  . Cardiac catheterization    . Endarterectomy Right 10/10/2015    Procedure: ENDARTERECTOMY CAROTID-RIGHT;  Surgeon: Serafina Mitchell, MD;  Location[facility] OR;  Service: Vascular;  Laterality: Right;   Family History  Problem Relation Age of Onset  . Colon cancer Neg Hx   . Aneurysm Sister    Social History  Substance Use Topics  . Smoking status: Never Smoker   . Smokeless tobacco: Never Used  . Alcohol Use: No    Review of Systems 10 Systems reviewed and are negative for acute change except as noted in the HPI.    Allergies  Review of patient's allergies indicates no known allergies.  Home Medications   Prior to Admission medications   Medication Sig Start Date End Date Taking? Authorizing Provider  amLODipine (NORVASC) 10 MG tablet Take 1 tablet (10 mg total) by mouth daily. 10/21/15  Yes Daniel J Angiulli, PA-C  apixaban (ELIQUIS) 5 MG TABS tablet Take 1 tablet (5 mg total) by mouth 2 (two) times daily. 10/21/15  Yes Lavon Paganini  Angiulli, PA-C  atorvastatin (LIPITOR) 80 MG tablet Take 1 tablet (80 mg total) by mouth daily. 10/21/15  Yes Daniel J Angiulli, PA-C  carvedilol (COREG) 3.125 MG tablet Take 3.125 mg by mouth 2 (two) times daily. 10/31/15  Yes Historical Provider, MD  ezetimibe (ZETIA) 10 MG tablet Take 1 tablet (10 mg total) by mouth daily. 10/14/15  Yes Nishant Dhungel, MD  furosemide (LASIX) 20 MG tablet Take 1 tablet (20 mg total) by mouth daily. 10/21/15  Yes Daniel J Angiulli, PA-C  isosorbide mononitrate (IMDUR) 30 MG 24 hr tablet Take 1 tablet (30 mg total) by mouth daily. 10/21/15  Yes Daniel  J Angiulli, PA-C  oxyCODONE (OXY IR/ROXICODONE) 5 MG immediate release tablet Take 1-2 tablets (5-10 mg total) by mouth every 6 (six) hours as needed for moderate pain. 10/21/15  Yes Daniel J Angiulli, PA-C  pantoprazole (PROTONIX) 40 MG tablet Take 1 tablet (40 mg total) by mouth daily. 10/21/15  Yes Daniel J Angiulli, PA-C  tamsulosin (FLOMAX) 0.4 MG CAPS capsule Take 1 capsule (0.4 mg total) by mouth daily after supper. 10/14/15  Yes Nishant Dhungel, MD  albuterol (PROVENTIL HFA;VENTOLIN HFA) 108 (90 BASE) MCG/ACT inhaler Inhale 2 puffs into the lungs every 6 (six) hours as needed for wheezing or shortness of breath. Patient not taking: Reported on 10/07/2015 06/21/15   Thurnell Lose, MD  aspirin EC 81 MG tablet Take 1 tablet (81 mg total) by mouth daily. Patient not taking: Reported on 10/07/2015 06/21/15   Thurnell Lose, MD  atorvastatin (LIPITOR) 80 MG tablet TAKE ONE TABLET BY MOUTH ONCE DAILY Patient not taking: Reported on 11/05/2015 10/28/15   Biagio Borg, MD  senna-docusate (SENOKOT-S) 8.6-50 MG tablet Take 1 tablet by mouth at bedtime as needed for mild constipation. Patient not taking: Reported on 11/05/2015 10/14/15   Nishant Dhungel, MD   BP 121/72 mmHg  Pulse 82  Temp(Src) 101.1 F (38.4 C) (Oral)  Resp 13  Ht 5' 6.5" (1.689 m)  Wt 163 lb (73.936 kg)  BMI 25.92 kg/m2  SpO2 97% Physical Exam  Constitutional: He is oriented to person, place, and time.  Patient is nontoxic and alert. He has no respiratory distress. Mental status is clear.  HENT:  Head: Normocephalic and atraumatic.  Nose: Nose normal.  Oropharynx is pink and moist. He has areas of dental decay but no evident swelling or abscess present.  Neck: Neck supple.  Cardiovascular:  Tachycardia. Occasional ectopic beat. No gross rub murmur or gallop.  Pulmonary/Chest: Effort normal and breath sounds normal.  Abdominal: Soft. Bowel sounds are normal. He exhibits no distension and no mass. There is no tenderness. There  is no rebound and no guarding.  Genitourinary:  Genital normal visual inspection. Inguinal canals no mass no fullness. Inspection of right inguinal area shows no areas of puncture or hematoma.  Musculoskeletal: Normal range of motion. He exhibits no edema or tenderness.  No reproducible soft tissue tenderness of the thighs or lower legs. No peripheral edema. Feet are in good condition without wounds, cellulitis or deformity. No palpable soft tissue anomaly.  Neurological: He is alert and oriented to person, place, and time. He exhibits normal muscle tone. Coordination normal.  Skin: Skin is warm and dry. No pallor.  Psychiatric: He has a normal mood and affect.    ED Course  Procedures (including critical care time) CRITICAL CARE Performed by: Charlesetta Shanks   Total critical care time: 45 minutes  Critical care time was exclusive of separately  billable procedures and treating other patients.  Critical care was necessary to treat or prevent imminent or life-threatening deterioration.  Critical care was time spent personally by me on the following activities: development of treatment plan with patient and/or surrogate as well as nursing, discussions with consultants, evaluation of patient's response to treatment, examination of patient, obtaining history from patient or surrogate, ordering and performing treatments and interventions, ordering and review of laboratory studies, ordering and review of radiographic studies, pulse oximetry and re-evaluation of patient's condition. Labs Review Labs Reviewed  COMPREHENSIVE METABOLIC PANEL - Abnormal; Notable for the following:    Chloride 100 (*)    Glucose, Bld 114 (*)    Creatinine, Ser 1.45 (*)    Albumin 3.2 (*)    ALT 11 (*)    GFR calc non Af Amer 46 (*)    GFR calc Af Amer 54 (*)    All other components within normal limits  CBC WITH DIFFERENTIAL/PLATELET - Abnormal; Notable for the following:    Hemoglobin 9.3 (*)    HCT 30.7 (*)     MCV 66.2 (*)    MCH 20.0 (*)    All other components within normal limits  URINALYSIS, ROUTINE W REFLEX MICROSCOPIC (NOT AT [facility]) - Abnormal; Notable for the following:    APPearance TURBID (*)    Hgb urine dipstick LARGE (*)    Protein, ur 30 (*)    Leukocytes, UA LARGE (*)    All other components within normal limits  URINE MICROSCOPIC-ADD ON - Abnormal; Notable for the following:    Squamous Epithelial / LPF 0-5 (*)    Bacteria, UA MANY (*)    All other components within normal limits  I-STAT CG4 LACTIC ACID, ED - Abnormal; Notable for the following:    Lactic Acid, Venous 2.17 (*)    All other components within normal limits  CULTURE, BLOOD (ROUTINE X 2)  CULTURE, BLOOD (ROUTINE X 2)  URINE CULTURE  CULTURE, BLOOD (ROUTINE X 2)  CULTURE, BLOOD (ROUTINE X 2)  INFLUENZA PANEL BY PCR (TYPE A & B, H1N1)  CBC WITH DIFFERENTIAL/PLATELET  COMPREHENSIVE METABOLIC PANEL  LACTIC ACID, PLASMA  LACTIC ACID, PLASMA  PROTIME-INR  APTT  I-STAT CG4 LACTIC ACID, ED    Imaging Review Dg Chest 2 View  11/05/2015  CLINICAL DATA:  Urinary tract infection, confusion EXAM: CHEST  2 VIEW COMPARISON:  06/20/2015 FINDINGS: Uncoiling of the aorta and cardiac enlargement, mild, both stable. Vascular pattern is normal. Lungs are clear. No pleural effusions. IMPRESSION: No active cardiopulmonary disease. Electronically Signed   By: Skipper Cliche M.D.   On: 11/05/2015 17:18   I have personally reviewed and evaluated these images and lab results as part of my medical decision-making.   EKG Interpretation   Date/Time:  Tuesday November 05 2015 14:48:03 EDT Ventricular Rate:  121 PR Interval:  156 QRS Duration: 91 QT Interval:  313 QTC Calculation: 444 R Axis:   -4 Text Interpretation:  Sinus tachycardia Abnormal R-wave progression, early  transition LVH with secondary repolarization abnormality agree.. no sig  change from old Confirmed by Johnney Killian, MD, Jeannie Done [phone]) on 11/05/2015  3:26:55 PM      Consult: Reviewed Dr. Sherral Hammers [facility] for admission MDM   Final diagnoses:  UTI (lower urinary tract infection)  Sepsis, due to unspecified organism [facility])  Severe comorbid illness   Patient presents with fever to 101. Lactic acid is elevated. Blood pressure is stable. Patient was tachycardic on arrival. He has responded  well to fluids and antibiotics. Patient had recent hospitalization for stroke. No new or acute neurologic complaints. His mental status is clear. He has no focal deficits. Patient mentions spasmodic-type pain in his right anterior thigh. It comes and goes. Examination of the leg is normal. There are no soft tissue abnormalities, no peripheral edema, no calf tenderness and no mass, fullness or erythema in the groin. Will be admitted for urinary tract infection with sepsis.    Charlesetta Shanks, MD 11/05/15 [id number]

## 2015-11-05 NOTE — ED Notes (Signed)
Was dx with UTI yesterday, has not started on antibx yet.

## 2015-11-05 NOTE — ED Notes (Signed)
Attempted to call report

## 2015-11-05 NOTE — Progress Notes (Addendum)
Pharmacy Antibiotic Note  [name] is a 75 y.o. male admitted on 11/05/2015 with urosepsis.  WBC 8.3, Tm 101.1, SCr 1 > 1.45, eCrCl 40-45 ml/min.  Plan: -Cefepime 2 g IV q24h -Monitor cultures, renal fx, deescalation when appropriate   Height: 5' 6.5" (168.9 cm) Weight: 163 lb (73.936 kg) IBW/kg (Calculated) : 64.95  Temp (24hrs), Avg:99.7 F (37.6 C), Min:98.2 F (36.8 C), Max:101.1 F (38.4 C)   Recent Labs Lab 11/05/15 1455 11/05/15 1508  WBC 8.3  --   CREATININE 1.45*  --   LATICACIDVEN  --  2.17*    Estimated Creatinine Clearance: 41.7 mL/min (by C-G formula based on Cr of 1.45).    No Known Allergies  Antimicrobials this admission: 4/4 cefepime >  Dose adjustments this admission: N/A  Microbiology results: 4/4 blood cx: 4/4 urine cx:  Thank you for allowing pharmacy to be a part of this patient's care.     Pharmacy Code Sepsis Protocol  Time of code sepsis page: 1527  []  Antibiotics delivered at 1540 []  Antibiotics administered prior to code at  (if checked, omit next 2 questions)  Were antibiotics ordered at the time of the code sepsis page? Yes Was it required to contact the physician? [x]  Physician not contacted []  Physician contacted to order antibiotics for code sepsis []  Physician contacted to recommend changing antibiotics  Pharmacy consulted for: N/A  Anti-infectives    Start     Dose/Rate Route Frequency Ordered Stop   11/06/15 0300  ceFEPIme (MAXIPIME) 2 g in dextrose 5 % 50 mL IVPB     2 g 100 mL/hr over 30 Minutes Intravenous Every 12 hours 11/05/15 1531     11/05/15 1530  ceFEPIme (MAXIPIME) 2 g in dextrose 5 % 50 mL IVPB     2 g 100 mL/hr over 30 Minutes Intravenous  Once 11/05/15 1521          Nurse education provided: []  Minutes left to administer antibiotics to achieve 1 hour goal []  Correct order of antibiotic administration []  Antibiotic Y-site compatibilities     Cay Kath, Jake Church, PharmD 11/05/2015, 3:44  PM

## 2015-11-05 NOTE — ED Notes (Signed)
Floor RN to follow up with second dose of lasix

## 2015-11-05 NOTE — ED Notes (Signed)
Pt states has taken too  Much medicine--- has taken 2 furosemide 20mg , 2 Tamsulosin 0.4mg  , 2 lipitor 80mg . -- was supposed to take only 1 of each.

## 2015-11-06 ENCOUNTER — Inpatient Hospital Stay (HOSPITAL_COMMUNITY): Payer: Medicare Other

## 2015-11-06 DIAGNOSIS — E785 Hyperlipidemia, unspecified: Secondary | ICD-10-CM

## 2015-11-06 DIAGNOSIS — N390 Urinary tract infection, site not specified: Secondary | ICD-10-CM

## 2015-11-06 DIAGNOSIS — I482 Chronic atrial fibrillation: Secondary | ICD-10-CM

## 2015-11-06 DIAGNOSIS — A419 Sepsis, unspecified organism: Principal | ICD-10-CM

## 2015-11-06 DIAGNOSIS — I480 Paroxysmal atrial fibrillation: Secondary | ICD-10-CM

## 2015-11-06 DIAGNOSIS — M79609 Pain in unspecified limb: Secondary | ICD-10-CM

## 2015-11-06 LAB — CBC WITH DIFFERENTIAL/PLATELET
BASOS PCT: 0 %
Basophils Absolute: 0 10*3/uL (ref 0.0–0.1)
EOS ABS: 0.1 10*3/uL (ref 0.0–0.7)
EOS PCT: 1 %
HCT: 26.3 % — ABNORMAL LOW (ref 39.0–52.0)
Hemoglobin: 8.1 g/dL — ABNORMAL LOW (ref 13.0–17.0)
LYMPHS ABS: 1.5 10*3/uL (ref 0.7–4.0)
Lymphocytes Relative: 18 %
MCH: 20.4 pg — AB (ref 26.0–34.0)
MCHC: 30.8 g/dL (ref 30.0–36.0)
MCV: 66.1 fL — AB (ref 78.0–100.0)
MONO ABS: 0.5 10*3/uL (ref 0.1–1.0)
Monocytes Relative: 6 %
NEUTROS ABS: 6.1 10*3/uL (ref 1.7–7.7)
NEUTROS PCT: 75 %
PLATELETS: 197 10*3/uL (ref 150–400)
RBC: 3.98 MIL/uL — ABNORMAL LOW (ref 4.22–5.81)
RDW: 14.7 % (ref 11.5–15.5)
WBC: 8.2 10*3/uL (ref 4.0–10.5)

## 2015-11-06 LAB — TROPONIN I
TROPONIN I: 0.03 ng/mL (ref <0.031)
TROPONIN I: 0.04 ng/mL — AB (ref <0.031)

## 2015-11-06 LAB — COMPREHENSIVE METABOLIC PANEL
ALT: 9 U/L — AB (ref 17–63)
ANION GAP: 11 (ref 5–15)
AST: 15 U/L (ref 15–41)
Albumin: 2.4 g/dL — ABNORMAL LOW (ref 3.5–5.0)
Alkaline Phosphatase: 41 U/L (ref 38–126)
BUN: 14 mg/dL (ref 6–20)
CALCIUM: 8.5 mg/dL — AB (ref 8.9–10.3)
CHLORIDE: 105 mmol/L (ref 101–111)
CO2: 25 mmol/L (ref 22–32)
CREATININE: 1.21 mg/dL (ref 0.61–1.24)
GFR, EST NON AFRICAN AMERICAN: 58 mL/min — AB (ref >60)
Glucose, Bld: 103 mg/dL — ABNORMAL HIGH (ref 65–99)
Potassium: 4 mmol/L (ref 3.5–5.1)
SODIUM: 141 mmol/L (ref 135–145)
Total Bilirubin: 0.4 mg/dL (ref 0.3–1.2)
Total Protein: 6.9 g/dL (ref 6.5–8.1)

## 2015-11-06 NOTE — Progress Notes (Signed)
[facility]  Patient Status: Active (receiving services up to time of hospitalization)  [facility] is providing the following services: RN, PT and OT  If patient discharges after hours, please call [phone].   [name] 11/06/2015, 10:51 AM

## 2015-11-06 NOTE — Progress Notes (Signed)
VASCULAR LAB PRELIMINARY  PRELIMINARY  PRELIMINARY  PRELIMINARY  Right lower extremity venous duplex completed.     Right:  No evidence of DVT, superficial thrombosis, or Baker's cyst.   Janifer Adie, RVT, RDMS 11/06/2015, 9:31 AM

## 2015-11-06 NOTE — Progress Notes (Signed)
PATIENT DETAILS Name: [name] Age: 75 y.o. Sex: male Date of Birth: 1941/07/01 Admit Date: 11/05/2015 Admitting Physician Allie Bossier, MD [id number] [name], MD  Subjective: Feels much better.  Assessment/Plan: Active Problems: Sepsis secondary to UTI: Significantly improved, sepsis pathophysiology has resolved. Reviewed most recent admission note, patient had a Foley catheter for bladder outlet obstruction. Continue cefepime, await cultures. Discontinue IV fluids. Follow.  AKI: Resolved, likely prerenal azotemia secondary to above.  Anemia: Appears to be a chronic issue, suspect worsened secondary to sepsis and IV fluid dilution.  Hypertension: Controlled-Continue Coreg  Dyslipidemia: Continue statin.  Paroxysmal atrial fibrillation: Continue Eliquis, rate controlled with Coreg.  BPH with recent bladder outlet obstruction: Patient recently was discharged from this hospital to CIR with a Foley catheter which has not been removed. Continue Flomax.  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  History of CAD: Without chest pain or shortness of breath,  Disposition: Remain inpatient-suspect will require another 2 more days of hospitalization.  Antimicrobial agents  See below  Anti-infectives    Start     Dose/Rate Route Frequency Ordered Stop   11/06/15 1900  ceFEPIme (MAXIPIME) 2 g in dextrose 5 % 50 mL IVPB  Status:  Discontinued     2 g 100 mL/hr over 30 Minutes Intravenous Every 24 hours 11/05/15 1847 11/05/15 1921   11/06/15 1700  ceFEPIme (MAXIPIME) 2 g in dextrose 5 % 50 mL IVPB     2 g 100 mL/hr over 30 Minutes Intravenous Every 24 hours 11/05/15 1921     11/06/15 0300  ceFEPIme (MAXIPIME) 2 g in dextrose 5 % 50 mL IVPB  Status:  Discontinued     2 g 100 mL/hr over 30 Minutes Intravenous Every 12 hours 11/05/15 1531 11/05/15 1847   11/05/15 1830  ceFEPIme (MAXIPIME) 2 g in  dextrose 5 % 50 mL IVPB  Status:  Discontinued     2 g 100 mL/hr over 30 Minutes Intravenous  Once 11/05/15 1829 11/05/15 1901   11/05/15 1530  ceFEPIme (MAXIPIME) 2 g in dextrose 5 % 50 mL IVPB     2 g 100 mL/hr over 30 Minutes Intravenous  Once 11/05/15 1521 11/05/15 1616      DVT Prophylaxis: Eliquis  Code Status: Full code   Family Communication None at bedside  Procedures: None  CONSULTS:  None  Time spent 30 minutes-Greater than 50% of this time was spent in counseling, explanation of diagnosis, planning of further management, and coordination of care.  MEDICATIONS: Scheduled Meds: . apixaban  5 mg Oral BID  . atorvastatin  80 mg Oral Daily  . carvedilol  3.125 mg Oral BID  . ceFEPime (MAXIPIME) IV  2 g Intravenous Q24H  . ezetimibe  10 mg Oral Daily  . pantoprazole  40 mg Oral Daily  . tamsulosin  0.4 mg Oral QPC supper   Continuous Infusions: . sodium chloride 125 mL/hr at 11/06/15 1005   PRN Meds:.albuterol    PHYSICAL EXAM: Vital signs in last 24 hours: Filed Vitals:   11/05/15 1945 11/05/15 2104 11/06/15 0600 11/06/15 1431  BP: 140/84 148/66 146/67 144/69  Pulse: 84 80  84  Temp:  98.4 F (36.9 C) 98.4 F (36.9 C) 98.3 F (36.8 C)  TempSrc:  Oral Oral Oral  Resp: 16 18 16 18   Height:  5\' 6"  (1.676 m)    Weight:  71.215 kg (157 lb)    SpO2: 99% 99% 98% 98%    Weight change:  Filed Weights   11/05/15 1450 11/05/15 2104  Weight: 73.936 kg (163 lb) 71.215 kg (157 lb)   Body mass index is 25.35 kg/(m^2).   Gen Exam: Awake and alert with clear speech.   Neck: Supple, No JVD.   Chest: B/L Clear.   CVS: S1 S2 Regular, no murmurs.  Abdomen: soft, BS +, non tender, non distended.  Extremities: no edema, lower extremities warm to touch. Neurologic: Non Focal.   Skin: No Rash.   Wounds: N/A.   Intake/Output from previous day:  Intake/Output Summary (Last 24 hours) at 11/06/15 1559 Last data filed at 11/06/15 1300  Gross per 24 hour    Intake   2980 ml  Output   1150 ml  Net   1830 ml     LAB RESULTS: CBC  Recent Labs Lab 11/05/15 1455 11/06/15 0515  WBC 8.3 8.2  HGB 9.3* 8.1*  HCT 30.7* 26.3*  PLT 219 197  MCV 66.2* 66.1*  MCH 20.0* 20.4*  MCHC 30.3 30.8  RDW 14.9 14.7  LYMPHSABS 0.8 1.5  MONOABS 0.7 0.5  EOSABS 0.1 0.1  BASOSABS 0.0 0.0    Chemistries   Recent Labs Lab 11/05/15 1455 11/06/15 0515  NA 138 141  K 3.9 4.0  CL 100* 105  CO2 24 25  GLUCOSE 114* 103*  BUN 19 14  CREATININE 1.45* 1.21  CALCIUM 9.3 8.5*    CBG: No results for input(s): GLUCAP in the last 168 hours.  GFR Estimated Creatinine Clearance: 49.1 mL/min (by C-G formula based on Cr of 1.21).  Coagulation profile  Recent Labs Lab 11/05/15 1911  INR 1.23    Cardiac Enzymes  Recent Labs Lab 11/05/15 1911 11/06/15 0035 11/06/15 0515  TROPONINI 0.03 0.03 0.04*    Invalid input(s): POCBNP No results for input(s): DDIMER in the last 72 hours. No results for input(s): HGBA1C in the last 72 hours. No results for input(s): CHOL, HDL, LDLCALC, TRIG, CHOLHDL, LDLDIRECT in the last 72 hours. No results for input(s): TSH, T4TOTAL, T3FREE, THYROIDAB in the last 72 hours.  Invalid input(s): FREET3 No results for input(s): VITAMINB12, FOLATE, FERRITIN, TIBC, IRON, RETICCTPCT in the last 72 hours. No results for input(s): LIPASE, AMYLASE in the last 72 hours.  Urine Studies No results for input(s): UHGB, CRYS in the last 72 hours.  Invalid input(s): UACOL, UAPR, USPG, UPH, UTP, UGL, UKET, UBIL, UNIT, UROB, ULEU, UEPI, UWBC, URBC, UBAC, CAST, UCOM, BILUA  MICROBIOLOGY: Recent Results (from the past 240 hour(s))  Culture, blood (routine x 2)     Status: None (Preliminary result)   Collection Time: 11/05/15  2:55 PM  Result Value Ref Range Status   Specimen Description BLOOD LEFT FOREARM  Final   Special Requests BOTTLES DRAWN AEROBIC AND ANAEROBIC 5CC  Final   Culture NO GROWTH < 24 HOURS  Final   Report  Status PENDING  Incomplete  Urine culture     Status: Abnormal (Preliminary result)   Collection Time: 11/05/15  2:55 PM  Result Value Ref Range Status   Specimen Description URINE, RANDOM  Final   Special Requests NONE  Final   Culture >=100,000 COLONIES/mL ESCHERICHIA COLI (A)  Final   Report Status PENDING  Incomplete  Culture, blood (routine x 2)     Status: None (Preliminary result)   Collection Time: 11/05/15  3:43 PM  Result Value Ref Range Status  Specimen Description BLOOD RIGHT HAND  Final   Special Requests BOTTLES DRAWN AEROBIC AND ANAEROBIC 5CC  Final   Culture NO GROWTH < 24 HOURS  Final   Report Status PENDING  Incomplete    RADIOLOGY STUDIES/RESULTS: Dg Chest 2 View  11/05/2015  CLINICAL DATA:  Urinary tract infection, confusion EXAM: CHEST  2 VIEW COMPARISON:  06/20/2015 FINDINGS: Uncoiling of the aorta and cardiac enlargement, mild, both stable. Vascular pattern is normal. Lungs are clear. No pleural effusions. IMPRESSION: No active cardiopulmonary disease. Electronically Signed   By: Skipper Cliche M.D.   On: 11/05/2015 17:18   Ct Head Wo Contrast  10/11/2015  CLINICAL DATA:  Code stroke.  Slurred speech. EXAM: CT HEAD WITHOUT CONTRAST TECHNIQUE: Contiguous axial images were obtained from the base of the skull through the vertex without intravenous contrast. COMPARISON:  10/06/2015 FINDINGS: Mild generalized age related volume loss. Mild chronic small-vessel ischemic change of the deep white matter. No CT evidence of acute infarction, mass lesion, hemorrhage, hydrocephalus or extra-axial collection. Question slight hyperdensity in the right MCA, not definite. Calvarium is unremarkable. Sinuses are clear. There is atherosclerotic calcification of the major vessels at the base of the brain. IMPRESSION: No acute finding by CT. These results were called by telephone at the time of interpretation on 10/11/2015 at 4:00 pm to Dr. Wallie Char , who verbally acknowledged these  results. Electronically Signed   By: Nelson Chimes M.D.   On: 10/11/2015 16:00   Mr Brain Wo Contrast  10/11/2015  CLINICAL DATA:  Acute encephalopathy. Episode of altered mental status in the bathroom earlier today. EXAM: MRI HEAD WITHOUT CONTRAST TECHNIQUE: Multiplanar, multiecho pulse sequences of the brain and surrounding structures were obtained without intravenous contrast. COMPARISON:  Head CT 10/11/2015 and MRI 10/06/2015 FINDINGS: Multiple small foci of restricted diffusion are again seen in the right cerebral hemisphere involving cortex and white matter. Some of these have decreased in conspicuity compared to the prior MRI, however there are scattered areas of acute infarction which are new or slightly larger than on the prior study. These have been marked with arrows on axial diffusion series 4 and are present in the right parietal cortex, right centrum semiovale and corona radiata, anterior right frontal lobe, right occipital cortex and white matter, and right temporal lobe. There is no evidence of intracranial hemorrhage, mass, midline shift, or extra-axial fluid collection. There is mild cerebral atrophy. Patchy T2 hyperintensities in the cerebral white matter bilaterally are compatible with moderate chronic small vessel ischemic disease. Chronic lacunar infarcts are noted in the thalami and deep cerebral white matter bilaterally. Orbits are unremarkable. Mild paranasal sinus mucosal thickening is noted. Mastoid air cells are clear. Major intracranial vascular flow voids are preserved, with evidence of distal right ICA stenosis better evaluated on recent CTA. IMPRESSION: 1. Evolving, small acute infarcts throughout the right cerebral hemisphere with some areas of new infarction, likely watershed ischemia. 2. Moderate chronic small vessel ischemic disease. Electronically Signed   By: Logan Bores M.D.   On: 10/11/2015 20:49   Ct Coronary Morp W/cta Cor W/score W/ca W/cm &/or Wo/cm  10/10/2015   ADDENDUM REPORT: 10/10/2015 10:55 CLINICAL DATA:  Chest pain EXAM: Cardiac/Coronary  CT TECHNIQUE: The patient was scanned on a Philips 256 scanner. FINDINGS: A 120 kV prospective scan was triggered in the descending thoracic aorta at 111 HU's. Axial non-contrast 3 mm slices were carried out through the heart. The data set was analyzed on a dedicated work station and scored using  the Agatson method. Gantry rotation speed was 270 msecs and collimation was .9 mm. 5 mg of iv Metoprolol and 0.8 mg of sl NTG was given. The 3D data set was reconstructed in 5% intervals of the 67-82 % of the R-R cycle. Diastolic phases were analyzed on a dedicated work station using MPR, MIP and VRT modes. The patient received 80 cc of contrast. Aorta:  Normal size, minimal calcifications, no dissection. Aortic Valve:  Trileaflet, minimal calcifications. Coronary Arteries: Normal origin.  Left dominance. Left main is a large artery that has mild noncalcified plaque in the proximal segment and mild calcified plaque in the distal portion with associated stenosis 25-50%. LAD is a large artery that has moderate diffuse mixed, predominantly calcified plaque in the proximal and mid segments with associated stenosis 50-69%. Distal LAD has only mild plaque. The first diagonal branch has only minimal plaque, the second diagonal branch has mild calcified ostial plaque. LCX artery has diffuse moderate predominantly calcified plaque in its mid portion with associated stenosis 50-69%. There is a focal spot at the distal LCX artery suspicious of stenosis close to 70%. PLA and PDA have minimal plaque. RCA is a very small non-dominant artery with no obvious plaque. IMPRESSION: 1. Coronary calcium score of 980. This was 71 percentile for age and sex matched control. 2. Normal coronary origin with left dominance. 3. Diffuse moderate plaque in the LAD and LCX arteries. An aggressive medical therapy is recommended. Ena Dawley Electronically Signed   By:  Ena Dawley   On: 10/10/2015 10:55  10/10/2015  EXAM: OVER-READ INTERPRETATION  CT CHEST The following report is an over-read performed by radiologist Dr. Rebekah Chesterfield [facility] on 10/10/2015. This over-read does not include interpretation of cardiac or coronary anatomy or pathology. The coronary calcium score/coronary CTA interpretation by the cardiologist is attached. COMPARISON:  No priors. FINDINGS: In the medial aspect of the left lower lobe there is a 12 x 15 mm area of nodular architectural distortion (image 56 of series 304). This is somewhat similar in retrospect to remote prior study 06/09/2007, although slightly more bulky. 6 mm left upper lobe pulmonary nodule (image 29 of series 304). 3 mm left upper lobe nodule (image 39 of series 304). 11 x 7 mm nodule in the inferior aspect of the right upper lobe abutting the minor fissure (image 24 of series 304). No acute consolidative airspace disease. Trace bilateral pleural effusions lying dependently. Areas of ground-glass attenuation and mild interlobular septal thickening are noted throughout the lungs bilaterally, suggesting a background of mild interstitial pulmonary edema. No definite lymphadenopathy within the visualized thorax. Visualized portions of the upper abdomen are remarkable for atherosclerosis. There are no aggressive appearing lytic or blastic lesions noted in the visualized portions of the skeleton. IMPRESSION: 1. Multiple pulmonary nodules scattered throughout the lungs bilaterally. Although the largest of these has been present on prior study dating back to 2008, it does appear slightly more bulky. This largest nodule is favored to represent an area of scarring, however, there are several other nodules that warrant attention on followup studies, and further evaluation with PET-CT is suggested at this time. 2. Evidence of mild interstitial pulmonary edema. 3. Trace bilateral pleural effusions bilaterally. These results  will be called to the ordering clinician or representative by the Radiologist Assistant, and communication documented in the PACS or zVision Dashboard. Electronically Signed: By: Vinnie Langton M.D. On: 10/10/2015 09:57   Ir Angio Intra Extracran Sel Com Carotid Innominate Bilat Mod Sed  10/08/2015  CLINICAL DATA:  Left-sided weakness, leg greater than arm. Abnormal CT angiogram of the brain and neck. EXAM: BILATERAL COMMON CAROTID AND INNOMINATE ANGIOGRAPHY AND BILATERAL VERTEBRAL ARTERY ANGIOGRAMS PROCEDURE: Contrast:  60 ml 86mL OMNIPAQUE IOHEXOL 300 MG/ML  SOLN Anesthesia/Sedation:  Conscious sedation. Medications: Versed at 1 mg IV.  Fentanyl 25 mcg IV. Following a full explanation of the procedure along with the potential associated complications, an informed witnessed consent was obtained. The right groin was prepped and draped in the usual sterile fashion. Thereafter using modified Seldinger technique, transfemoral access into the right common femoral artery was obtained without difficulty. Over a 0.035 inch guidewire, a 5 French Pinnacle sheath was inserted. Through this, and also over 0.035 inch guidewire, a 5 [place] JB 1 catheter was advanced to the aortic arch region and selectively positioned in the right common carotid artery, the right subclavian artery, the left common carotid artery and the left subclavian artery. There were no acute complications. The patient tolerated the procedure well. FINDINGS: The right common carotid arteriogram demonstrates the right external carotid artery to be mildly narrowed proximal. Its branches are normally opacified otherwise. The right internal carotid artery has severe pre occlusive stenosis on the lateral, likely a string sign with mild poststenotic dilatation. There is mild tortuosity of the junction of the proximal 1/3 and middle 1/3 of the cervical right ICA. More distally, the vessel is seen to opacify normally to the cranial skull base. The petrous and  the proximal cavernous segments are widely patent. There is an approximately 50% stenosis of the caval cavernous segment, with approximately 80-85% narrowing of the supraclinoid right ICA. The right middle cerebral artery distribution is seen to opacify into the capillary and the venous phases. Focal areas of caliber narrowing with irregularity represent intracranial arteriosclerotic disease. There is no angiographic visualization of the right anterior cerebral A1 segment. The right vertebral artery origin is patent. The vessel is seen to opacify to the cranial skull base where it opacifies the left posterior-inferior cerebellar artery and the left vertebrobasilar junction. Flow is seen into the distal basilar artery, the posterior cerebral arteries and superior cerebellar arteries. Extreme attenuation of the contrast in the proximal basilar artery is seen. The left vertebral artery origin is normal. The vessel is seen to opacify to the cranial skull base. There is an approximately 50% narrowing of the left vertebrobasilar junction just proximal to the left posterior-inferior cerebellar artery. High-grade 75 plus stenosis seen of the left vertebrobasilar junction just distal to the origin of the left posterior inferior cerebellar artery. Additionally there is approximately 70% narrowing of the proximal basilar artery. The visualized posterior cerebral arteries, superior cerebellar arteries and the anterior-inferior cerebellar arteries including the left posterior inferior cerebellar artery demonstrated focal areas of caliber narrowing with irregularity most consistent with intracranial arteriosclerosis. The left common carotid arteriogram demonstrates the left external carotid artery and its major branches to be widely patent. The left internal carotid artery at the bulb demonstrates minimal plaque without associated ulcerations. Mild tortuosity is seen of the junction of the middle and the proximal 1/3 of the left  internal carotid artery. More distally the vessel is seen to opacify to the cranial skull base. The petrous segment is widely patent. The cavernous segment demonstrates mild focal areas of caliber irregularity with approximately 50% narrowing of the distal cavernous segment. The supraclinoid segment appears widely patent. The left middle and the left anterior cerebral arteries are seen to opacify into the capillary and the venous  phases. Scattered focal areas of caliber irregularity with narrowing suggest intracranial arteriosclerosis. Also seen is simultaneous opacification via the anterior communicating artery of the right anterior cerebral A2 segment and also an attenuated distal right A1 segment. There appears to be contrast leaking into the right middle cerebral artery at the proximal A1 segment. The right A2 segment demonstrates focal areas of caliber irregularity consistent with intracranial arteriosclerosis. 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 Approximately 85% narrowing of the right internal carotid artery supraclinoid segment, and 50% narrowing of the right internal carotid artery cavernous segment. Approximately 75% stenosis of the left vertebrobasilar junction just distal to the origin of the left posterior-inferior cerebellar artery, and approximately 70% narrowing of the proximal basilar artery. Approximately 50% narrowing of the left internal carotid artery cavernous segment. 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 Electronically Signed   By: Luanne Bras M.D.   On: 10/08/2015 11:34   Ir Angio Vertebral Sel Subclavian Innominate Bilat Mod Sed  10/08/2015  CLINICAL DATA:  Left-sided weakness, leg greater than arm. Abnormal CT angiogram of the brain and neck. EXAM: BILATERAL COMMON CAROTID AND INNOMINATE ANGIOGRAPHY AND BILATERAL VERTEBRAL ARTERY ANGIOGRAMS PROCEDURE: Contrast:  60 ml  34mL OMNIPAQUE IOHEXOL 300 MG/ML  SOLN Anesthesia/Sedation:  Conscious sedation. Medications: Versed at 1 mg IV.  Fentanyl 25 mcg IV. Following a full explanation of the procedure along with the potential associated complications, an informed witnessed consent was obtained. The right groin was prepped and draped in the usual sterile fashion. Thereafter using modified Seldinger technique, transfemoral access into the right common femoral artery was obtained without difficulty. Over a 0.035 inch guidewire, a 5 French Pinnacle sheath was inserted. Through this, and also over 0.035 inch guidewire, a 5 [place] JB 1 catheter was advanced to the aortic arch region and selectively positioned in the right common carotid artery, the right subclavian artery, the left common carotid artery and the left subclavian artery. There were no acute complications. The patient tolerated the procedure well. FINDINGS: The right common carotid arteriogram demonstrates the right external carotid artery to be mildly narrowed proximal. Its branches are normally opacified otherwise. The right internal carotid artery has severe pre occlusive stenosis on the lateral, likely a string sign with mild poststenotic dilatation. There is mild tortuosity of the junction of the proximal 1/3 and middle 1/3 of the cervical right ICA. More distally, the vessel is seen to opacify normally to the cranial skull base. The petrous and the proximal cavernous segments are widely patent. There is an approximately 50% stenosis of the caval cavernous segment, with approximately 80-85% narrowing of the supraclinoid right ICA. The right middle cerebral artery distribution is seen to opacify into the capillary and the venous phases. Focal areas of caliber narrowing with irregularity represent intracranial arteriosclerotic disease. There is no angiographic visualization of the right anterior cerebral A1 segment. The right vertebral artery origin is patent. The vessel is  seen to opacify to the cranial skull base where it opacifies the left posterior-inferior cerebellar artery and the left vertebrobasilar junction. Flow is seen into the distal basilar artery, the posterior cerebral arteries and superior cerebellar arteries. Extreme attenuation of the contrast in the proximal basilar artery is seen. The left vertebral artery origin is normal. The vessel is seen to opacify to the cranial skull base. There is an approximately 50% narrowing of the left vertebrobasilar junction just proximal to the left posterior-inferior cerebellar artery. High-grade 75  plus stenosis seen of the left vertebrobasilar junction just distal to the origin of the left posterior inferior cerebellar artery. Additionally there is approximately 70% narrowing of the proximal basilar artery. The visualized posterior cerebral arteries, superior cerebellar arteries and the anterior-inferior cerebellar arteries including the left posterior inferior cerebellar artery demonstrated focal areas of caliber narrowing with irregularity most consistent with intracranial arteriosclerosis. The left common carotid arteriogram demonstrates the left external carotid artery and its major branches to be widely patent. The left internal carotid artery at the bulb demonstrates minimal plaque without associated ulcerations. Mild tortuosity is seen of the junction of the middle and the proximal 1/3 of the left internal carotid artery. More distally the vessel is seen to opacify to the cranial skull base. The petrous segment is widely patent. The cavernous segment demonstrates mild focal areas of caliber irregularity with approximately 50% narrowing of the distal cavernous segment. The supraclinoid segment appears widely patent. The left middle and the left anterior cerebral arteries are seen to opacify into the capillary and the venous phases. Scattered focal areas of caliber irregularity with narrowing suggest intracranial  arteriosclerosis. Also seen is simultaneous opacification via the anterior communicating artery of the right anterior cerebral A2 segment and also an attenuated distal right A1 segment. There appears to be contrast leaking into the right middle cerebral artery at the proximal A1 segment. The right A2 segment demonstrates focal areas of caliber irregularity consistent with intracranial arteriosclerosis. 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 Approximately 85% narrowing of the right internal carotid artery supraclinoid segment, and 50% narrowing of the right internal carotid artery cavernous segment. Approximately 75% stenosis of the left vertebrobasilar junction just distal to the origin of the left posterior-inferior cerebellar artery, and approximately 70% narrowing of the proximal basilar artery. Approximately 50% narrowing of the left internal carotid artery cavernous segment. 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 Electronically Signed   By: Luanne Bras M.D.   On: 10/08/2015 11:34    Oren Binet, MD  [facility] Pager:[phone]  If 7PM-7AM, please contact night-coverage [email or web address] Password [email or web address] 11/06/2015, 3:59 PM   LOS: 1 day

## 2015-11-07 ENCOUNTER — Encounter (HOSPITAL_COMMUNITY): Payer: Self-pay | Admitting: General Practice

## 2015-11-07 DIAGNOSIS — I482 Chronic atrial fibrillation: Secondary | ICD-10-CM | POA: Diagnosis not present

## 2015-11-07 DIAGNOSIS — D509 Iron deficiency anemia, unspecified: Secondary | ICD-10-CM | POA: Diagnosis not present

## 2015-11-07 DIAGNOSIS — M7542 Impingement syndrome of left shoulder: Secondary | ICD-10-CM | POA: Diagnosis not present

## 2015-11-07 DIAGNOSIS — I639 Cerebral infarction, unspecified: Secondary | ICD-10-CM | POA: Diagnosis not present

## 2015-11-07 DIAGNOSIS — A419 Sepsis, unspecified organism: Secondary | ICD-10-CM | POA: Diagnosis not present

## 2015-11-07 DIAGNOSIS — K219 Gastro-esophageal reflux disease without esophagitis: Secondary | ICD-10-CM | POA: Diagnosis not present

## 2015-11-07 DIAGNOSIS — I6930 Unspecified sequelae of cerebral infarction: Secondary | ICD-10-CM | POA: Diagnosis not present

## 2015-11-07 DIAGNOSIS — E785 Hyperlipidemia, unspecified: Secondary | ICD-10-CM | POA: Diagnosis not present

## 2015-11-07 DIAGNOSIS — I480 Paroxysmal atrial fibrillation: Secondary | ICD-10-CM | POA: Diagnosis not present

## 2015-11-07 DIAGNOSIS — I2510 Atherosclerotic heart disease of native coronary artery without angina pectoris: Secondary | ICD-10-CM | POA: Diagnosis not present

## 2015-11-07 DIAGNOSIS — I69354 Hemiplegia and hemiparesis following cerebral infarction affecting left non-dominant side: Secondary | ICD-10-CM | POA: Diagnosis not present

## 2015-11-07 DIAGNOSIS — R69 Illness, unspecified: Secondary | ICD-10-CM | POA: Diagnosis not present

## 2015-11-07 DIAGNOSIS — N390 Urinary tract infection, site not specified: Secondary | ICD-10-CM | POA: Diagnosis not present

## 2015-11-07 DIAGNOSIS — N183 Chronic kidney disease, stage 3 (moderate): Secondary | ICD-10-CM | POA: Diagnosis not present

## 2015-11-07 DIAGNOSIS — R262 Difficulty in walking, not elsewhere classified: Secondary | ICD-10-CM | POA: Diagnosis not present

## 2015-11-07 DIAGNOSIS — I10 Essential (primary) hypertension: Secondary | ICD-10-CM | POA: Diagnosis not present

## 2015-11-07 DIAGNOSIS — G8194 Hemiplegia, unspecified affecting left nondominant side: Secondary | ICD-10-CM | POA: Diagnosis not present

## 2015-11-07 LAB — URINE CULTURE: Culture: >=100000 — AB

## 2015-11-07 MED ORDER — CEPHALEXIN 500 MG PO CAPS: 500.0000 mg | ORAL_CAPSULE | Freq: Three times a day (TID) | ORAL | Status: DC

## 2015-11-07 NOTE — NC FL2 (Signed)
[place] LEVEL OF CARE SCREENING TOOL     IDENTIFICATION  Patient Name: [name] Birthdate: 1941-02-26 Sex: male Admission Date (Current Location): 11/05/2015  [facility] and [place] Number:  Herbalist and Address:  [facility], [place] [address] [place], [place] [address]      Provider Number: [id number]  Attending Physician Name and Address:  Jonetta Osgood, MD  Relative Name and Phone Number:       Current Level of Care: Hospital Recommended Level of Care: [place] Prior Approval Number:    Date Approved/Denied:   PASRR Number: [id number]  Discharge Plan: SNF    Current Diagnoses: Patient Active Problem List   Diagnosis Date Noted  . Sepsis secondary to UTI ([place]) 11/05/2015  . UTI (lower urinary tract infection) 11/05/2015  . CAD in native artery 11/05/2015  . 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 Sinus tachycardia ([place])   . 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 Labile blood pressure   . Essential hypertension   . Urinary retention   . Right-sided cerebrovascular accident (CVA) ([place])   . Hypokalemia   . Paroxysmal atrial fibrillation (HCC)   . Carotid artery stenosis   . S/P carotid endarterectomy   . Coronary artery disease involving native coronary artery of native heart without angina pectoris   . HLD (hyperlipidemia)   . Benign essential HTN   . Tachycardia   . Acute blood loss anemia   . Near syncope   . Prediabetes 10/08/2015  . Right cavernous carotid stenosis   . Carotid stenosis   . Acute ischemic stroke ([place]) 10/07/2015  . Chest pain 06/19/2015  . DOE (dyspnea on exertion) 06/19/2015  . Hypertensive urgency 06/19/2015  . Wheezing   . Bilateral arm pain 06/26/2014  . 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 Impingement syndrome of left shoulder 01/20/2013  . Atrial fibrillation ([place]) 04/18/2012  . Leg pain 01/28/2012  . Dizziness  and giddiness 11/19/2011  . Dehydration 11/19/2011  . CKD (chronic kidney disease), stage III 11/19/2011  . HTN (hypertension) 11/19/2011  . Dyslipidemia 11/19/2011  . PAF (paroxysmal atrial fibrillation) ([place]) 11/19/2011  . Erectile dysfunction 08/20/2011  . Nocturia 08/20/2011  . Preventative health care 08/15/2011  . Anemia, iron deficiency 08/15/2011  . History of pneumonia 08/15/2011  . CAD (coronary artery disease) 08/15/2011  . History of MI (myocardial infarction) 08/15/2011  . Dilated aortic root ([place]) 08/15/2011  . Syncope 07/16/2011  . Claudication ([place]) 07/16/2011  . BPH (benign prostatic hyperplasia) 07/16/2011  . Rash 04/29/2011  . CVA (cerebral infarction) 03/31/2011  . Inguinal hernia 03/31/2011    Orientation RESPIRATION BLADDER Height & Weight     Self, Time, Situation, Place  Normal Continent Weight: 157 lb 4.8 oz (71.351 kg) Height:  5\' 6"  (167.6 cm)  BEHAVIORAL SYMPTOMS/MOOD NEUROLOGICAL BOWEL NUTRITION STATUS      Continent  (Please see DC summary)  AMBULATORY STATUS COMMUNICATION OF NEEDS Skin   Extensive Assist Verbally Surgical wounds (Closed incision on neck and groin)                       Personal Care Assistance Level of Assistance  Bathing, Feeding, Dressing Bathing Assistance: Limited assistance Feeding assistance: Independent Dressing Assistance: Limited assistance     Functional Limitations Info  Hearing, Sight Sight Info: Impaired Hearing Info: Impaired      SPECIAL CARE FACTORS FREQUENCY  PT (By licensed PT)     PT Frequency: not yet assessed              Contractures      Additional Factors Info  Code Status, Allergies Code Status Info: Full Allergies Info: NKA           Current Medications (11/07/2015):  This is the current hospital active medication list Current Facility-Administered Medications  Medication Dose Route Frequency Provider Last Rate Last Dose  . albuterol (PROVENTIL) (2.5 MG/3ML) 0.083%  nebulizer solution 2.5 mg  2.5 mg Nebulization Q6H PRN Allie Bossier, MD      . apixaban Arne [place]) tablet 5 mg  5 mg Oral BID Allie Bossier, MD   5 mg at 11/07/15 0940  . atorvastatin (LIPITOR) tablet 80 mg  80 mg Oral Daily Allie Bossier, MD   80 mg at 11/07/15 0940  . carvedilol (COREG) tablet 3.125 mg  3.125 mg Oral BID Allie Bossier, MD   3.125 mg at 11/07/15 0940  . cephALEXin (KEFLEX) capsule 500 mg  500 mg Oral 3 times per day Jonetta Osgood, MD      . ezetimibe (ZETIA) tablet 10 mg  10 mg Oral Daily Allie Bossier, MD   10 mg at 11/07/15 0940  . pantoprazole (PROTONIX) EC tablet 40 mg  40 mg Oral Daily Allie Bossier, MD   40 mg at 11/07/15 0940  . tamsulosin (FLOMAX) capsule 0.4 mg  0.4 mg Oral QPC supper Allie Bossier, MD   0.4 mg at 11/06/15 1841     Discharge Medications: Please see discharge summary for a list of discharge medications.  Relevant Imaging Results:  Relevant Lab Results:   Additional Information SS#: [id number]  Benard Halsted, LCSWA

## 2015-11-07 NOTE — Evaluation (Signed)
Physical Therapy Evaluation Patient Details Name: [name] MRN: [id number] DOB: [birth year] Today's Date: 11/07/2015   History of Present Illness  This 75 y.o. male admitted with dizziness, weakness, chills, and hypotension.  Dx:  sepsis due to UTI.  Pt also noted to have been taking medications incorrectly.  PMH includes: recent CVA (went to CIR and d/c'd home 10/21/15), MI    Clinical Impression  Pt admitted with above diagnosis. Pt currently with functional limitations due to the deficits listed below (see PT Problem List).  [name] became dizzy and was leaning to the Rt when standing at bedside and thus it was not safe to attempt ambulation.  He required up to mod assist to steady in standing and to perform stand>sit back to bed.  Recommending SNF at d/c as pt lives alone and requires mod assist w/ mobility. Pt will benefit from skilled PT to increase their independence and safety with mobility to allow discharge to the venue listed below.      Follow Up Recommendations SNF;Supervision for mobility/OOB    Equipment Recommendations  None recommended by PT    Recommendations for Other Services       Precautions / Restrictions Precautions Precautions: Fall Restrictions Weight Bearing Restrictions: No      Mobility  Bed Mobility Overal bed mobility: Needs Assistance Bed Mobility: Supine to Sit;Sit to Supine     Supine to sit: Min guard;HOB elevated Sit to supine: Min guard   General bed mobility comments: Cues for sequencing and increased time and effort.  Pt uses bed rail.  Transfers Overall transfer level: Needs assistance Equipment used: Rolling walker (2 wheeled) Transfers: Sit to/from Stand Sit to Stand: Min assist;Mod assist Stand pivot transfers: Min assist       General transfer comment: Min assist to steady w/ sit>stand and cues for proper hand placement.  Pt unable to initate stand>sit despite verbal and tactile cues and requires mod physical assist to  sit.  Ambulation/Gait             General Gait Details: not safe to attempt at this time  Stairs            Wheelchair Mobility    Modified Rankin (Stroke Patients Only)       Balance Overall balance assessment: Needs assistance Sitting-balance support: Bilateral upper extremity supported;Feet supported Sitting balance-Leahy Scale: Fair     Standing balance support: Bilateral upper extremity supported;During functional activity Standing balance-Leahy Scale: Poor                               Pertinent Vitals/Pain Pain Assessment: No/denies pain    Home Living Family/patient expects to be discharged to:: Skilled nursing facility Living Arrangements: Alone Available Help at Discharge: Family;Available PRN/intermittently Type of Home: House Home Access: Stairs to enter Entrance Stairs-Rails: Left Entrance Stairs-Number of Steps: 4 Home Layout: One level   Additional Comments: Might stay at brother's house at d/c, he lives in [place].  Family visiting pt today and pt will begin converasation with family about assist avaialble at d/c and where he might go.    Prior Function Level of Independence: Independent with assistive device(s)         Comments: Not using AD for short distance ambulation to bathroom, otherwise using RW.  Says he has only driven x1 since his last hospital admission.  Denies fall since d/c.       Hand Dominance  Dominant Hand: Right    Extremity/Trunk Assessment   Upper Extremity Assessment: Defer to OT evaluation           Lower Extremity Assessment: Overall WFL for tasks assessed (did not formally assess coordination)         Communication   Communication: No difficulties  Cognition Arousal/Alertness: Awake/alert Behavior During Therapy: WFL for tasks assessed/performed Overall Cognitive Status: Impaired/Different from baseline Area of Impairment: Attention;Safety/judgement;Problem solving   Current  Attention Level: Sustained (with cues )     Safety/Judgement: Decreased awareness of safety   Problem Solving: Difficulty sequencing;Requires verbal cues;Decreased initiation General Comments: Pt very tangential, self distracts frequently.  Affect different than last admission.  Requires physical assist to initiate stand>sit.    General Comments General comments (skin integrity, edema, etc.): Upon standing pt reports feeling dizzy and begins to lean to the Rt, requiring mod assist to steady.  Pt says he can't explain what he is feeling but that it is "not normal and not good".  BP sitting EOB prior to standing 119/69, BP supine at end of session 146/67.  Dinamap not working properly when taking standing BP.  Neuro check completed and was negative.  HR in 70s and 80s.  RN notified.    Exercises        Assessment/Plan    PT Assessment Patient needs continued PT services  PT Diagnosis Difficulty walking   PT Problem List Decreased activity tolerance;Decreased balance;Decreased mobility;Decreased knowledge of use of DME;Decreased safety awareness  PT Treatment Interventions DME instruction;Gait training;Stair training;Functional mobility training;Therapeutic activities;Therapeutic exercise;Balance training;Cognitive remediation;Patient/family education   PT Goals (Current goals can be found in the Care Plan section) Acute Rehab PT Goals Patient Stated Goal: to feel better and go to rehab PT Goal Formulation: With patient Time For Goal Achievement: 11/21/15 Potential to Achieve Goals: Good    Frequency Min 3X/week   Barriers to discharge Decreased caregiver support;Inaccessible home environment Steps to enter home and lives alone    Co-evaluation               End of Session Equipment Utilized During Treatment: Gait belt Activity Tolerance: Patient limited by fatigue Patient left: in bed;with call bell/phone within reach;with bed alarm set Nurse Communication: Mobility  status;Other (comment) (VS;pt "not feeling right" w/ R lean but negative neuro check)         Time: [id number] PT Time Calculation (min) (ACUTE ONLY): 27 min   Charges:   PT Evaluation $PT Eval Moderate Complexity: 1 Procedure PT Treatments $Therapeutic Activity: 8-22 mins   PT G Codes:       Collie Siad PT, DPT  Pager: [phone] Phone: [phone] 11/07/2015, 2:30 PM

## 2015-11-07 NOTE — Care Management Note (Signed)
Case Management Note  Patient Details  Name: SHIGEKI FURNO MRN: [id number] Date of Birth: [birth year]  Subjective/Objective:   Pt admitted for Sepsis. Pt was previously active with [facility] RN, PT, OT.                  Action/Plan: Pt ready for d/c and PT consults- resulted in patient not being able to get up and ambulate. RN did place call to CSW and CM did text CSW in regards to disposition needs. Pt sill ready for d/c today. Pt has [employer] for insurance. No further needs from CM at this time.    Expected Discharge Date:                  Expected Discharge Plan:  Skilled Nursing Facility  In-House Referral:  Clinical Social Work  Discharge planning Services  CM Consult  Post Acute Care Choice:  NA Choice offered to:  NA  DME Arranged:  N/A DME Agency:  NA  HH Arranged:  NA HH Agency:  NA  Status of Service:  Completed, signed off  Medicare Important Message Given:    Date Medicare IM Given:    Medicare IM give by:    Date Additional Medicare IM Given:    Additional Medicare Important Message give by:     If discussed at [place] of Stay Meetings, dates discussed:    Additional Comments:  [name], RN 11/07/2015, 1:00 PM

## 2015-11-07 NOTE — Discharge Instructions (Signed)

## 2015-11-07 NOTE — Evaluation (Signed)
Occupational Therapy Evaluation Patient Details Name: [name] MRN: [id number] DOB: 25-Apr-1941 Today's Date: 11/07/2015    History of Present Illness This 75 y.o. male admitted with dizziness, weakness, chills, and hypotension.  Dx:  sepsis due to UTI.  Pt also noted to have been taking medications incorrectly.  PMH includes: recent CVA (went to CIR and d/c'd home 10/21/15), MI   Clinical Impression   Pt admitted with above.  He presents to OT with generalized weakness, impaired balance, decreased safety awareness.  He currently requires min A for ADLs and lives alone.  He was having difficulty managing his meds at home, and reports he has not been to grocery store since being home (was making pancakes for meals).  Please note Pt also noted to be pocketing food on the left.  At this time, he is unsafe to return home alone - recommend SNF, with likely transition to ALF.  All further OT needs can be met at [facility].   Acute OT will sign off at this time.     Follow Up Recommendations  SNF;Supervision/Assistance - 24 hour    Equipment Recommendations  None recommended by OT    Recommendations for Other Services       Precautions / Restrictions Precautions Precautions: Fall      Mobility Bed Mobility                  Transfers Overall transfer level: Needs assistance Equipment used: Rolling walker (2 wheeled) Transfers: Sit to/from [facility] Sit to Stand: Min assist Stand pivot transfers: Min assist       General transfer comment: min A to steady     Balance                                            ADL Overall ADL's : Needs assistance/impaired Eating/Feeding: Modified independent Eating/Feeding Details (indicate cue type and reason): Pt noted with pocketing of food Lt cheek When this pointed out, he responds, he is aware, he just eats slow  Grooming: Wash/dry hands;Wash/dry face;Oral care;Brushing hair;Min guard;Standing    Upper Body Bathing: Set up;Supervision/ safety;Sitting   Lower Body Bathing: Minimal assistance;Sit to/from stand   Upper Body Dressing : Set up;Supervision/safety;Sitting   Lower Body Dressing: Minimal assistance;Sit to/from stand   Toilet Transfer: Minimal assistance;Ambulation;Comfort height toilet;Regular Toilet;Grab bars;RW   Toileting- Clothing Manipulation and Hygiene: Minimal assistance;Sitting/lateral lean       Functional mobility during ADLs: Minimal assistance;Rolling walker General ADL Comments: requires assist for balance and safety      Vision     Perception Perception Perception Tested?: Yes   Praxis Praxis Praxis tested?: Within functional limits    Pertinent Vitals/Pain Pain Assessment: No/denies pain     Hand Dominance Right   Extremity/Trunk Assessment Upper Extremity Assessment Upper Extremity Assessment: Overall WFL for tasks assessed   Lower Extremity Assessment Lower Extremity Assessment: Defer to PT evaluation       Communication Communication Communication: No difficulties   Cognition Arousal/Alertness: Awake/alert Behavior During Therapy: WFL for tasks assessed/performed Overall Cognitive Status: Impaired/Different from baseline Area of Impairment: Attention;Safety/judgement;Problem solving   Current Attention Level: Sustained (with cues )     Safety/Judgement: Decreased awareness of safety   Problem Solving: Difficulty sequencing;Requires verbal cues General Comments: Pt very tangential, self distracts frequently.  Affect different than last admission    General Comments  Exercises       Shoulder Instructions      Home Living Family/patient expects to be discharged to:: Skilled nursing facility Living Arrangements: Alone Available Help at Discharge: Family;Available PRN/intermittently Type of Home: House Home Access: Stairs to enter [employer] of Steps: 4 Entrance Stairs-Rails: Left Home Layout:  One level     Bathroom Shower/Tub: Tub/shower unit;Curtain Shower/tub characteristics: Architectural technologist: Standard Bathroom Accessibility: Yes How Accessible: Accessible via walker     Additional Comments: Might stay at brother's house at d/c, he lives in [place].  Family visiting pt today and pt will begin converasation with family about assist avaialble at d/c and where he might go.      Prior Functioning/Environment          Comments: Pt reports he was performing ADLs mod I.  He reports he has not gone to grocery since being home, and was eating pancakes for meals.  Notes indicate pt was not taking medications correctly     OT Diagnosis: Generalized weakness;Cognitive deficits   OT Problem List: Decreased strength;Decreased activity tolerance;Impaired balance (sitting and/or standing);Decreased cognition;Decreased safety awareness   OT Treatment/Interventions:      OT Goals(Current goals can be found in the care plan section) Acute Rehab OT Goals Patient Stated Goal: to get stronger  OT Goal Formulation: All assessment and education complete, DC therapy  OT Frequency:     Barriers to D/C: Decreased caregiver support          Co-evaluation              End of Session Equipment Utilized During Treatment: Rolling walker;Gait belt Nurse Communication: Mobility status  Activity Tolerance: Patient tolerated treatment well Patient left: in bed;with call bell/phone within reach;with bed alarm set   Time: 1331-1347 OT Time Calculation (min): 16 min Charges:  OT Evaluation $OT Eval Moderate Complexity: 1 Procedure G-Codes:    Cassey Hurrell M November 26, 2015, 2:05 PM

## 2015-11-07 NOTE — Clinical Social Work Placement (Signed)
   CLINICAL SOCIAL WORK PLACEMENT  NOTE  Date:  11/07/2015  Patient Details  Name: CALEX QUIRINDONGO MRN: [id number] Date of Birth: 1940-09-17  Clinical Social Work is seeking post-discharge placement for this patient at the [place] level of care (*CSW will initial, date and re-position this form in  chart as items are completed):  No   Patient/family provided with [place] Work Department's list of facilities offering this level of care within the geographic area requested by the patient (or if unable, by the patient's family).  No   Patient/family informed of their freedom to choose among providers that offer the needed level of care, that participate in Medicare, Medicaid or managed care program needed by the patient, have an available bed and are willing to accept the patient.  No   Patient/family informed of [facility]'s ownership interest in [facility] and [facility], as well as of the fact that they are under no obligation to receive care at these facilities.  PASRR submitted to EDS on 11/07/15     PASRR number received on 11/07/15     Existing PASRR number confirmed on       FL2 transmitted to all facilities in geographic area requested by pt/family on 11/07/15     FL2 transmitted to all facilities within larger geographic area on       Patient informed that his/her managed care company has contracts with or will negotiate with certain facilities, including the following:        Yes   Patient/family informed of bed offers received.  Patient [facility] bed at [facility]     Physician recommends and patient chooses bed at      Patient to be transferred to [facility] on 11/07/15.  Patient to be transferred to facility by PTAR     Patient family notified on 11/07/15 of transfer.  Name of family member notified:  Friend     PHYSICIAN       Additional Comment:     _______________________________________________ Benard Halsted, [place] 11/07/2015, 3:17 PM

## 2015-11-07 NOTE — Progress Notes (Signed)
Patient will DC to: [facility] Anticipated DC date: 11/07/15 Family notified: Friend at bedside Transport by: PTAR  CSW signing off.  Cedric Fishman, [place] Social Worker [phone]

## 2015-11-07 NOTE — Clinical Social Work Note (Signed)
Clinical Social Work Assessment  Patient Details  Name: [name] MRN: [id number] Date of Birth: Aug 24, 1940  Date of referral:  11/07/15               Reason for consult:  Facility Placement                Permission sought to share information with:  Facility Sport and exercise psychologist, Family Supports Permission granted to share information::  Yes, Verbal Permission Granted  Name::     [name]  Agency::  [facility] SNFs  Relationship::  friend  Contact Information:  [phone]  Housing/Transportation Living arrangements for the past 2 months:  [place] of Information:  Patient, Other (Comment Required) (Friend) Patient Interpreter Needed:  None Criminal Activity/Legal Involvement Pertinent to Current Situation/Hospitalization:  No - Comment as needed Significant Relationships:  Friend Lives with:  Self Do you feel safe going back to the place where you live?  No Need for family participation in patient care:  Yes (Comment)  Care giving concerns:  CSW received referral for possible SNF placement at time of discharge. CSW met with patient and patient's friend regarding PT recommendation of SNF placement at time of discharge. Patient reports feeling unwell and is currently unable to care for patient at their home given patient's current physical needs and fall risk. Patient expressed understanding of PT recommendation and is agreeable to SNF placement at time of discharge. CSW to continue to follow and assist with discharge planning needs.   Social Worker assessment / plan:  CSW spoke with patient and patient's friend concerning possibility of rehab at [facility] before returning home.  Employment status:  Disabled (Comment on whether or not currently receiving Disability) Insurance information:  Managed Medicare PT Recommendations:  [place] / Referral to community resources:  Accident  Patient/Family's Response to care:   Patient recognizes need for rehab before returning home and is agreeable to a SNF in [place]. Patient reported preference for close to the hospital.  Patient/Family's Understanding of and Emotional Response to Diagnosis, Current Treatment, and Prognosis:  Patient is realistic regarding therapy needs. No questions/concerns about plan or treatment.    Emotional Assessment Appearance:  Appears stated age Attitude/Demeanor/Rapport:   (Appropriate) Affect (typically observed):  Accepting, Appropriate Orientation:  Oriented to Self, Oriented to Place, Oriented to  Time, Oriented to Situation Alcohol / Substance use:  Not Applicable Psych involvement (Current and /or in the community):  No (Comment)  Discharge Needs  Concerns to be addressed:  Care Coordination Readmission within the last 30 days:  Yes Current discharge risk:  None Barriers to Discharge:  No Barriers Identified   Benard Halsted, [place] 11/07/2015, 3:23 PM

## 2015-11-07 NOTE — Discharge Summary (Addendum)
PATIENT DETAILS Name: [name] Age: 75 y.o. Sex: male Date of Birth: 01-03-1941 MRN: [id number]. Admitting Physician: Allie Bossier, MD [id number] [name], MD  Admit Date: 11/05/2015 Discharge date: 11/07/2015  Recommendations for Outpatient Follow-up:  1. Complete 7 days of Keflex.  2. Please repeat CBC/BMET at next visit 3. 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 Hypertension     echocardiogram 6/10: Moderate LVH, EF 55-65%, mild, mild MR, MAC, mild LAE, PASP 32  . Dyslipidemia   . History of stroke   . CAD (coronary artery disease)     cath in 1/08: EF 60%, mild dilated Ao root, oD2 30%, LAD 20-30%.  . Paroxysmal atrial fibrillation ([place])   . Anemia, iron deficiency 08/15/2011  . History of pneumonia 08/15/2011  . History of MI (myocardial infarction) 08/15/2011  . Dilated aortic root ([place]) 08/15/2011  . Stroke ([place]) 10/2011  . Myocardial infarction ([place])   . Hyperlipidemia   . Shortness of breath dyspnea   . Prediabetes 10/08/2015  . Preoperative cardiovascular examination 10/09/2015  . Dysrhythmia     DISCHARGE MEDICATIONS: Current Discharge Medication List    START taking these medications   Details  cephALEXin (KEFLEX) 500 MG capsule Take 1 capsule (500 mg total) by mouth every 8 (eight) hours. Qty: 21 capsule, Refills: 0      CONTINUE these medications which have NOT CHANGED   Details  amLODipine (NORVASC) 10 MG tablet Take 1 tablet (10 mg total) by mouth daily. Qty: 30  tablet, Refills: 0    apixaban (ELIQUIS) 5 MG TABS tablet Take 1 tablet (5 mg total) by mouth 2 (two) times daily. Qty: 60 tablet, Refills: 0    !! atorvastatin (LIPITOR) 80 MG tablet Take 1 tablet (80 mg total) by mouth daily. Qty: 90 tablet, Refills: 3    carvedilol (COREG) 3.125 MG tablet Take 3.125 mg by mouth 2 (two) times daily.    ezetimibe (ZETIA) 10 MG tablet Take 1 tablet (10 mg total) by mouth daily. Qty: 30 tablet, Refills: 0    furosemide (LASIX) 20 MG tablet Take 1 tablet (20 mg total) by mouth daily. Qty: 30 tablet, Refills: 1    isosorbide mononitrate (IMDUR) 30 MG 24 hr tablet Take 1 tablet (30 mg total) by mouth daily. Qty: 30 tablet, Refills: 0    oxyCODONE (OXY IR/ROXICODONE) 5 MG immediate release tablet Take 1-2 tablets (5-10 mg total) by mouth every 6 (six) hours as needed for moderate pain. Qty: 60 tablet, Refills: 0    pantoprazole (PROTONIX) 40 MG tablet Take 1 tablet (40 mg total) by mouth daily. Qty: 30 tablet, Refills: 0    tamsulosin (FLOMAX) 0.4 MG CAPS capsule Take 1 capsule (0.4 mg total) by mouth daily after supper. Qty: 30 capsule, Refills: 0    albuterol (PROVENTIL HFA;VENTOLIN HFA) 108 (90 BASE) MCG/ACT inhaler Inhale 2 puffs into the lungs every 6 (six) hours as needed for wheezing or shortness of breath. Qty:  1 Inhaler, Refills: 2    aspirin EC 81 MG tablet Take 1 tablet (81 mg total) by mouth daily. Qty: 30 tablet, Refills: 0    !! atorvastatin (LIPITOR) 80 MG tablet TAKE ONE TABLET BY MOUTH ONCE DAILY Qty: 90 tablet, Refills: 0    senna-docusate (SENOKOT-S) 8.6-50 MG tablet Take 1 tablet by mouth at bedtime as needed for mild constipation. Qty: 15 tablet, Refills: 0     !! - Potential duplicate medications found. Please discuss with provider.      ALLERGIES:  No Known Allergies  BRIEF HPI:  See H&P, Labs, Consult and Test reports for all details in brief, patient Is a 75 year old male with recent hospitalization for CVA and  acute urinary retention requiring a Foley catheter presented with fever, pelvic pain and dysuria. He was found to have a UTI and admitted for further evaluation and treatment  CONSULTATIONS:   None  PERTINENT RADIOLOGIC STUDIES: Dg Chest 2 View  11/05/2015  CLINICAL DATA:  Urinary tract infection, confusion EXAM: CHEST  2 VIEW COMPARISON:  06/20/2015 FINDINGS: Uncoiling of the aorta and cardiac enlargement, mild, both stable. Vascular pattern is normal. Lungs are clear. No pleural effusions. IMPRESSION: No active cardiopulmonary disease. Electronically Signed   By: Skipper Cliche M.D.   On: 11/05/2015 17:18   Ct Head Wo Contrast  10/11/2015  CLINICAL DATA:  Code stroke.  Slurred speech. EXAM: CT HEAD WITHOUT CONTRAST TECHNIQUE: Contiguous axial images were obtained from the base of the skull through the vertex without intravenous contrast. COMPARISON:  10/06/2015 FINDINGS: Mild generalized age related volume loss. Mild chronic small-vessel ischemic change of the deep white matter. No CT evidence of acute infarction, mass lesion, hemorrhage, hydrocephalus or extra-axial collection. Question slight hyperdensity in the right MCA, not definite. Calvarium is unremarkable. Sinuses are clear. There is atherosclerotic calcification of the major vessels at the base of the brain. IMPRESSION: No acute finding by CT. These results were called by telephone at the time of interpretation on 10/11/2015 at 4:00 pm to Dr. Wallie Char , who verbally acknowledged these results. Electronically Signed   By: Nelson Chimes M.D.   On: 10/11/2015 16:00   Mr Brain Wo Contrast  10/11/2015  CLINICAL DATA:  Acute encephalopathy. Episode of altered mental status in the bathroom earlier today. EXAM: MRI HEAD WITHOUT CONTRAST TECHNIQUE: Multiplanar, multiecho pulse sequences of the brain and surrounding structures were obtained without intravenous contrast. COMPARISON:  Head CT 10/11/2015 and MRI 10/06/2015 FINDINGS: Multiple small  foci of restricted diffusion are again seen in the right cerebral hemisphere involving cortex and white matter. Some of these have decreased in conspicuity compared to the prior MRI, however there are scattered areas of acute infarction which are new or slightly larger than on the prior study. These have been marked with arrows on axial diffusion series 4 and are present in the right parietal cortex, right centrum semiovale and corona radiata, anterior right frontal lobe, right occipital cortex and white matter, and right temporal lobe. There is no evidence of intracranial hemorrhage, mass, midline shift, or extra-axial fluid collection. There is mild cerebral atrophy. Patchy T2 hyperintensities in the cerebral white matter bilaterally are compatible with moderate chronic small vessel ischemic disease. Chronic lacunar infarcts are noted in the thalami and deep cerebral white matter bilaterally. Orbits are unremarkable. Mild paranasal sinus mucosal thickening is noted. Mastoid air cells are clear. Major intracranial vascular flow voids are preserved, with evidence of distal right ICA stenosis better evaluated on recent CTA.  IMPRESSION: 1. Evolving, small acute infarcts throughout the right cerebral hemisphere with some areas of new infarction, likely watershed ischemia. 2. Moderate chronic small vessel ischemic disease. Electronically Signed   By: Logan Bores M.D.   On: 10/11/2015 20:49   Ct Coronary Morp W/cta Cor W/score W/ca W/cm &/or Wo/cm  10/10/2015  ADDENDUM REPORT: 10/10/2015 10:55 CLINICAL DATA:  Chest pain EXAM: Cardiac/Coronary  CT TECHNIQUE: The patient was scanned on a Philips 256 scanner. FINDINGS: A 120 kV prospective scan was triggered in the descending thoracic aorta at 111 HU's. Axial non-contrast 3 mm slices were carried out through the heart. The data set was analyzed on a dedicated work station and scored using the [place]. Gantry rotation speed was 270 msecs and collimation was .9 mm.  5 mg of iv Metoprolol and 0.8 mg of sl NTG was given. The 3D data set was reconstructed in 5% intervals of the 67-82 % of the R-R cycle. Diastolic phases were analyzed on a dedicated work station using MPR, MIP and VRT modes. The patient received 80 cc of contrast. Aorta:  Normal size, minimal calcifications, no dissection. Aortic Valve:  Trileaflet, minimal calcifications. Coronary Arteries: Normal origin.  Left dominance. Left main is a large artery that has mild noncalcified plaque in the proximal segment and mild calcified plaque in the distal portion with associated stenosis 25-50%. LAD is a large artery that has moderate diffuse mixed, predominantly calcified plaque in the proximal and mid segments with associated stenosis 50-69%. Distal LAD has only mild plaque. The first diagonal branch has only minimal plaque, the second diagonal branch has mild calcified ostial plaque. LCX artery has diffuse moderate predominantly calcified plaque in its mid portion with associated stenosis 50-69%. There is a focal spot at the distal LCX artery suspicious of stenosis close to 70%. PLA and PDA have minimal plaque. RCA is a very small non-dominant artery with no obvious plaque. IMPRESSION: 1. Coronary calcium score of 980. This was 50 percentile for age and sex matched control. 2. Normal coronary origin with left dominance. 3. Diffuse moderate plaque in the LAD and LCX arteries. An aggressive medical therapy is recommended. Ena Dawley Electronically Signed   By: Ena Dawley   On: 10/10/2015 10:55  10/10/2015  EXAM: OVER-READ INTERPRETATION  CT CHEST The following report is an over-read performed by radiologist Dr. Rebekah Chesterfield [facility] on 10/10/2015. This over-read does not include interpretation of cardiac or coronary anatomy or pathology. The coronary calcium score/coronary CTA interpretation by the cardiologist is attached. COMPARISON:  No priors. FINDINGS: In the medial aspect of the left  lower lobe there is a 12 x 15 mm area of nodular architectural distortion (image 56 of series 304). This is somewhat similar in retrospect to remote prior study 06/09/2007, although slightly more bulky. 6 mm left upper lobe pulmonary nodule (image 29 of series 304). 3 mm left upper lobe nodule (image 39 of series 304). 11 x 7 mm nodule in the inferior aspect of the right upper lobe abutting the minor fissure (image 24 of series 304). No acute consolidative airspace disease. Trace bilateral pleural effusions lying dependently. Areas of ground-glass attenuation and mild interlobular septal thickening are noted throughout the lungs bilaterally, suggesting a background of mild interstitial pulmonary edema. No definite lymphadenopathy within the visualized thorax. Visualized portions of the upper abdomen are remarkable for atherosclerosis. There are no aggressive appearing lytic or blastic lesions noted in the visualized portions of the skeleton. IMPRESSION: 1. Multiple pulmonary nodules  scattered throughout the lungs bilaterally. Although the largest of these has been present on prior study dating back to 2008, it does appear slightly more bulky. This largest nodule is favored to represent an area of scarring, however, there are several other nodules that warrant attention on followup studies, and further evaluation with PET-CT is suggested at this time. 2. Evidence of mild interstitial pulmonary edema. 3. Trace bilateral pleural effusions bilaterally. These results will be called to the ordering clinician or representative by the Radiologist Assistant, and communication documented in the PACS or zVision Dashboard. Electronically Signed: By: Vinnie Langton M.D. On: 10/10/2015 09:57     PERTINENT LAB RESULTS: CBC:  Recent Labs  11/05/15 1455 11/06/15 0515  WBC 8.3 8.2  HGB 9.3* 8.1*  HCT 30.7* 26.3*  PLT 219 197   CMET CMP     Component Value Date/Time   NA 141 11/06/2015 0515   K 4.0 11/06/2015  0515   CL 105 11/06/2015 0515   CO2 25 11/06/2015 0515   GLUCOSE 103* 11/06/2015 0515   BUN 14 11/06/2015 0515   CREATININE 1.21 11/06/2015 0515   CALCIUM 8.5* 11/06/2015 0515   PROT 6.9 11/06/2015 0515   ALBUMIN 2.4* 11/06/2015 0515   AST 15 11/06/2015 0515   ALT 9* 11/06/2015 0515   ALKPHOS 41 11/06/2015 0515   BILITOT 0.4 11/06/2015 0515   GFRNONAA 58* 11/06/2015 0515   GFRAA >60 11/06/2015 0515    GFR Estimated Creatinine Clearance: 49.1 mL/min (by C-G formula based on Cr of 1.21). No results for input(s): LIPASE, AMYLASE in the last 72 hours.  Recent Labs  11/05/15 1911 11/06/15 0035 11/06/15 0515  TROPONINI 0.03 0.03 0.04*   Invalid input(s): POCBNP No results for input(s): DDIMER in the last 72 hours. No results for input(s): HGBA1C in the last 72 hours. No results for input(s): CHOL, HDL, LDLCALC, TRIG, CHOLHDL, LDLDIRECT in the last 72 hours. No results for input(s): TSH, T4TOTAL, T3FREE, THYROIDAB in the last 72 hours.  Invalid input(s): FREET3 No results for input(s): VITAMINB12, FOLATE, FERRITIN, TIBC, IRON, RETICCTPCT in the last 72 hours. Coags:  Recent Labs  11/05/15 1911  INR 1.23   Microbiology: Recent Results (from the past 240 hour(s))  Culture, blood (routine x 2)     Status: None (Preliminary result)   Collection Time: 11/05/15  2:55 PM  Result Value Ref Range Status   Specimen Description BLOOD LEFT FOREARM  Final   Special Requests BOTTLES DRAWN AEROBIC AND ANAEROBIC 5CC  Final   Culture NO GROWTH < 24 HOURS  Final   Report Status PENDING  Incomplete  Urine culture     Status: Abnormal   Collection Time: 11/05/15  2:55 PM  Result Value Ref Range Status   Specimen Description URINE, RANDOM  Final   Special Requests NONE  Final   Culture >=100,000 COLONIES/mL ESCHERICHIA COLI (A)  Final   Report Status 11/07/2015 FINAL  Final   Organism ID, Bacteria ESCHERICHIA COLI (A)  Final      Susceptibility   Escherichia coli - MIC*     AMPICILLIN <=2 SENSITIVE Sensitive     CEFAZOLIN <=4 SENSITIVE Sensitive     CEFTRIAXONE <=1 SENSITIVE Sensitive     CIPROFLOXACIN <=0.25 SENSITIVE Sensitive     GENTAMICIN <=1 SENSITIVE Sensitive     IMIPENEM <=0.25 SENSITIVE Sensitive     NITROFURANTOIN <=16 SENSITIVE Sensitive     TRIMETH/SULFA <=20 SENSITIVE Sensitive     AMPICILLIN/SULBACTAM <=2 SENSITIVE Sensitive  PIP/TAZO <=4 SENSITIVE Sensitive     * >=100,000 COLONIES/mL ESCHERICHIA COLI  Culture, blood (routine x 2)     Status: None (Preliminary result)   Collection Time: 11/05/15  3:43 PM  Result Value Ref Range Status   Specimen Description BLOOD RIGHT HAND  Final   Special Requests BOTTLES DRAWN AEROBIC AND ANAEROBIC 5CC  Final   Culture NO GROWTH < 24 HOURS  Final   Report Status PENDING  Incomplete     BRIEF HOSPITAL COURSE:  Sepsis secondary to UTI: Significantly improved, sepsis pathophysiology has resolved. Reviewed most recent admission note, patient had a Foley catheter for bladder outlet obstruction. Managed initially with IV cefepime, urine cultures positive for pansensitive Escherichia coli, blood cultures negative. Since sepsis pathophysiology has resolved and is much improved, we will transition him to oral Keflex and discharge patient home. Blood cultures are negative so far, but will need to be followed until negative.   AKI: Resolved, likely prerenal azotemia secondary to above.  Anemia: Appears to be a chronic issue, suspect worsened secondary to sepsis and IV fluid dilution. Please repeat CBC at next visit.  Hypertension: Controlled-Continue Coreg  Dyslipidemia: Continue statin.  Paroxysmal atrial fibrillation: Continue Eliquis, rate controlled with Coreg.  BPH with recent bladder outlet obstruction: Patient recently was discharged from this hospital to CIR with a Foley catheter which has now been removed. Continue Flomax.  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  History of CAD: Without chest pain or shortness of breath, continue aspirin, Coreg and statin.  TODAY-DAY OF DISCHARGE:  Subjective:   Kapone Watz today has no headache,no chest abdominal pain,no new weakness tingling or numbness, feels much better wants to go home today.   Objective:   Blood pressure 149/72, pulse 75, temperature 98.2 F (36.8 C), temperature source Oral, resp. rate 16, height 5\' 6"  (1.676 m), weight 71.351 kg (157 lb 4.8 oz), SpO2 99 %.  Intake/Output Summary (Last 24 hours) at 11/07/15 1144 Last data filed at 11/07/15 0856  Gross per 24 hour  Intake    480 ml  Output   1000 ml  Net   -520 ml   Filed Weights   11/05/15 1450 11/05/15 2104 11/07/15 0539  Weight: 73.936 kg (163 lb) 71.215 kg (157 lb) 71.351 kg (157 lb 4.8 oz)    Exam Awake Alert, Oriented *3, No new F.N deficits, Normal affect [place].AT,PERRAL Supple Neck,No JVD, No cervical lymphadenopathy appriciated.  Symmetrical Chest wall movement, Good air movement bilaterally, CTAB RRR,No Gallops,Rubs or new Murmurs, No Parasternal Heave +ve B.Sounds, Abd Soft, Non tender, No organomegaly appriciated, No rebound -guarding or rigidity. No Cyanosis, Clubbing or edema, No new Rash or bruise  DISCHARGE CONDITION: Stable  DISPOSITION: SNF  DISCHARGE INSTRUCTIONS:    Activity:  As tolerated with Full fall precautions use walker/cane & assistance as needed  Get Medicines reviewed and adjusted: Please take all your medications with you for your next visit with your Primary MD  Please request your Primary MD to go over all hospital tests and procedure/radiological results at the follow up, please ask your Primary MD to get all Hospital records sent to his/her office.  If you experience worsening of your admission symptoms, develop shortness of breath, life threatening emergency, suicidal or homicidal thoughts you must seek medical attention immediately by calling 911  or calling your MD immediately  if symptoms less severe.  You must read complete instructions/literature along with all the possible adverse reactions/side  effects for all the Medicines you take and that have been prescribed to you. Take any new Medicines after you have completely understood and accpet all the possible adverse reactions/side effects.   Do not drive when taking Pain medications.   Do not take more than prescribed Pain, Sleep and Anxiety Medications  Special Instructions: If you have smoked or chewed Tobacco  in the last 2 yrs please stop smoking, stop any regular Alcohol  and or any Recreational drug use.  Wear Seat belts while driving.  Please note  You were cared for by a hospitalist during your hospital stay. Once you are discharged, your primary care physician will handle any further medical issues. Please note that NO REFILLS for any discharge medications will be authorized once you are discharged, as it is imperative that you return to your primary care physician (or establish a relationship with a primary care physician if you do not have one) for your aftercare needs so that they can reassess your need for medications and monitor your lab values.   Diet recommendation: Heart Healthy diet  Discharge Instructions    Call MD for:  persistant nausea and vomiting    Complete by:  As directed      Call MD for:  temperature >100.4    Complete by:  As directed      Diet - low sodium heart healthy    Complete by:  As directed      Increase activity slowly    Complete by:  As directed            Follow-up Information    Follow up with Cathlean Cower, MD. Schedule an appointment as soon as possible for a visit in 1 week.   Specialties:  Internal Medicine, Radiology   Why:  Hospital follow up-please ask your Primary MD to follow blood culture results   Contact information:   [address] [place] [address] [phone]      Total Time spent on discharge  equals  45 minutes.  SignedOren Binet 11/07/2015 11:44 AM

## 2015-11-08 ENCOUNTER — Telehealth: Payer: Self-pay | Admitting: *Deleted

## 2015-11-08 DIAGNOSIS — I10 Essential (primary) hypertension: Secondary | ICD-10-CM | POA: Diagnosis not present

## 2015-11-08 DIAGNOSIS — E785 Hyperlipidemia, unspecified: Secondary | ICD-10-CM | POA: Diagnosis not present

## 2015-11-08 DIAGNOSIS — I69354 Hemiplegia and hemiparesis following cerebral infarction affecting left non-dominant side: Secondary | ICD-10-CM | POA: Diagnosis not present

## 2015-11-08 DIAGNOSIS — I2510 Atherosclerotic heart disease of native coronary artery without angina pectoris: Secondary | ICD-10-CM | POA: Diagnosis not present

## 2015-11-08 NOTE — Telephone Encounter (Signed)
Called pt to get him set-up for TCM hosp f/u w/Dr.John. He stated they ended up sending him to a rehab facility. He stated he wasn't sure what was the name of the facility but it is on [place] pt once he get d/c from their they will have him to make appt w/Dr. [name] since he wasn't aware how lomg he suppose to be there, and it is not on d/c summary...[name]

## 2015-11-10 LAB — CULTURE, BLOOD (ROUTINE X 2)
Culture: NO GROWTH
Culture: NO GROWTH

## 2015-11-11 ENCOUNTER — Encounter: Payer: Self-pay | Admitting: Adult Health

## 2015-11-11 ENCOUNTER — Non-Acute Institutional Stay (SKILLED_NURSING_FACILITY): Payer: Medicare Other | Admitting: Adult Health

## 2015-11-11 DIAGNOSIS — E785 Hyperlipidemia, unspecified: Secondary | ICD-10-CM | POA: Diagnosis not present

## 2015-11-11 DIAGNOSIS — N320 Bladder-neck obstruction: Secondary | ICD-10-CM

## 2015-11-11 DIAGNOSIS — G8194 Hemiplegia, unspecified affecting left nondominant side: Secondary | ICD-10-CM | POA: Diagnosis not present

## 2015-11-11 DIAGNOSIS — N390 Urinary tract infection, site not specified: Secondary | ICD-10-CM | POA: Diagnosis not present

## 2015-11-11 DIAGNOSIS — N400 Enlarged prostate without lower urinary tract symptoms: Secondary | ICD-10-CM | POA: Diagnosis not present

## 2015-11-11 DIAGNOSIS — D509 Iron deficiency anemia, unspecified: Secondary | ICD-10-CM | POA: Diagnosis not present

## 2015-11-11 DIAGNOSIS — I639 Cerebral infarction, unspecified: Secondary | ICD-10-CM

## 2015-11-11 DIAGNOSIS — I480 Paroxysmal atrial fibrillation: Secondary | ICD-10-CM | POA: Diagnosis not present

## 2015-11-11 DIAGNOSIS — I2510 Atherosclerotic heart disease of native coronary artery without angina pectoris: Secondary | ICD-10-CM | POA: Diagnosis not present

## 2015-11-11 DIAGNOSIS — I10 Essential (primary) hypertension: Secondary | ICD-10-CM

## 2015-11-11 NOTE — Progress Notes (Signed)
Patient ID: [name], male   DOB: 02/07/1941, 75 y.o.   MRN: [id number]    Facility: [facility]       No Known Allergies  Chief Complaint  Patient presents with  . Hospitalization Follow-up    Hospital follow up    HPI:  He has been hospitalized for uti with sepsis. He had been hospitalized in March of this year for a cva. He was unable to manage his medications at home. He is here for short term rehab. His goal is to return back home. At this time his needs may be better served in an assisted level of care. There are no nursing concerns at this time.    Past Medical History  Diagnosis Date  . Hypertension     echocardiogram 6/10: Moderate LVH, EF 55-65%, mild, mild MR, MAC, mild LAE, PASP 32  . Dyslipidemia   . History of stroke   . CAD (coronary artery disease)     cath in 1/08: EF 60%, mild dilated Ao root, oD2 30%, LAD 20-30%.  . Paroxysmal atrial fibrillation ([place])   . Anemia, iron deficiency 08/15/2011  . History of pneumonia 08/15/2011  . History of MI (myocardial infarction) 08/15/2011  . Dilated aortic root ([place]) 08/15/2011  . Stroke ([place]) 10/2011  . Myocardial infarction ([place])   . Hyperlipidemia   . Shortness of breath dyspnea   . Prediabetes 10/08/2015  . Preoperative cardiovascular examination 10/09/2015  . Dysrhythmia     Past Surgical History  Procedure Laterality Date  . Cardiac catheterization    . Endarterectomy Right 10/10/2015    Procedure: ENDARTERECTOMY CAROTID-RIGHT;  Surgeon: Serafina Mitchell, MD;  Location: [facility] OR;  Service: Vascular;  Laterality: Right;    Family History  Problem Relation Age of Onset  . Colon cancer Neg Hx   . Aneurysm Sister     Social History   Social History  . Marital Status: Widowed    Spouse Name: N/A  . Number of Children: 2  . Years of Education: N/A   Occupational History  . retired    Social History Main Topics  . Smoking status: Never Smoker   . Smokeless tobacco: Never Used  . Alcohol Use: No  .  Drug Use: No  . Sexual Activity: Not on file   Other Topics Concern  . Not on file   Social History Narrative     VITAL SIGNS BP 136/66 mmHg  Pulse 74  Temp(Src) 98.5 F (36.9 C) (Oral)  Resp 16  Ht 6\' 4"  (1.93 m)  Wt 167 lb (75.751 kg)  BMI 20.34 kg/m2  Patient's Medications  New Prescriptions   No medications on file  Previous Medications   ALBUTEROL (PROVENTIL HFA;VENTOLIN HFA) 108 (90 BASE) MCG/ACT INHALER    Inhale 2 puffs into the lungs every 6 (six) hours as needed for wheezing or shortness of breath.   AMLODIPINE (NORVASC) 10 MG TABLET    Take 1 tablet (10 mg total) by mouth daily.   APIXABAN (ELIQUIS) 5 MG TABS TABLET    Take 1 tablet (5 mg total) by mouth 2 (two) times daily.   ASPIRIN EC 81 MG TABLET    Take 1 tablet (81 mg total) by mouth daily.   ATORVASTATIN (LIPITOR) 80 MG TABLET    Take 1 tablet (80 mg total) by mouth daily.   CARVEDILOL (COREG) 3.125 MG TABLET    Take 3.125 mg by mouth 2 (two) times daily.   CEPHALEXIN (KEFLEX) 500 MG  CAPSULE    Take 1 capsule (500 mg total) by mouth every 8 (eight) hours.   EZETIMIBE (ZETIA) 10 MG TABLET    Take 1 tablet (10 mg total) by mouth daily.   FUROSEMIDE (LASIX) 20 MG TABLET    Take 1 tablet (20 mg total) by mouth daily.   ISOSORBIDE MONONITRATE (IMDUR) 30 MG 24 HR TABLET    Take 1 tablet (30 mg total) by mouth daily.   OXYCODONE (OXY IR/ROXICODONE) 5 MG IMMEDIATE RELEASE TABLET    Take 1-2 tablets (5-10 mg total) by mouth every 6 (six) hours as needed for moderate pain.   PANTOPRAZOLE (PROTONIX) 40 MG TABLET    Take 1 tablet (40 mg total) by mouth daily.   SENNA-DOCUSATE (SENOKOT-S) 8.6-50 MG TABLET    Take 1 tablet by mouth at bedtime as needed for mild constipation.   TAMSULOSIN (FLOMAX) 0.4 MG CAPS CAPSULE    Take 1 capsule (0.4 mg total) by mouth daily after supper.  Modified Medications   No medications on file  Discontinued Medications     SIGNIFICANT DIAGNOSTIC EXAMS  10-07-15: 2-d echo: Left ventricle:  The cavity size was normal. Wall thickness was increased in a pattern of severe LVH. Systolic function was normal. The estimated ejection fraction was in the range of 55% to 60%. Left ventricular diastolic function parameters were normal. - Aortic valve: There was mild regurgitation. - Mitral valve: There was mild regurgitation. - Left atrium: The atrium was moderately dilated.  10-11-15: mri of brain: 1. Evolving, small acute infarcts throughout the right cerebral hemisphere with some areas of new infarction, likely watershed ischemia. 2. Moderate chronic small vessel ischemic disease.  11-05-15: chest x-ray: No active cardiopulmonary disease.    LABS REVIEWED:   10-07-15:hgb a1c 6.2; chol 262; ldl 206; trig 118; hdl 32 11-05-15: wbc 8.3; hgb 9.3; hct 30.7; mcv 66.1 ;plt 219; glucose 114; bun 19; creat 1.45; k+ 3.9; na++138; liver normal albumin 3.2; urine culture: e-coli; blood culture: no growth 11-06-15: wbc 8.2; hgb 8.1; hct 26.3; mcv 66.1; plt 197; glucose 103; bun 14; creat 1.21; k+ 4.0; na++141; liver normal albumin 2.4      Review of Systems  Constitutional: Negative for malaise/fatigue.  Respiratory: Negative for cough and shortness of breath.   Cardiovascular: Negative for chest pain, palpitations and leg swelling.  Gastrointestinal: Negative for heartburn, abdominal pain and constipation.  Musculoskeletal: Negative for myalgias, back pain and joint pain.  Skin: Negative.   Neurological: Negative for dizziness.  Psychiatric/Behavioral: The patient is not nervous/anxious.       Physical Exam  Constitutional: He is oriented to person, place, and time. No distress.  Eyes: Conjunctivae are normal.  Neck: Neck supple. No JVD present. No thyromegaly present.  Cardiovascular: Normal rate, regular rhythm and intact distal pulses.   Respiratory: Effort normal and breath sounds normal. No respiratory distress. He has no wheezes.  GI: Soft. Bowel sounds are normal. He exhibits no  distension. There is no tenderness.  Musculoskeletal: He exhibits no edema.  Able to move all extremities  Has left lower extremity weakness   Lymphadenopathy:    He has no cervical adenopathy.  Neurological: He is alert and oriented to person, place, and time.  Skin: Skin is warm and dry. He is not diaphoretic.  Psychiatric: He has a normal mood and affect.      ASSESSMENT/ PLAN:  1. Hypertension: will continue norvasc 10 mg daily; coreg 3.125 mg twice daily will monitor  2. Dyslipidemia: will  continue lipitor 80 mg daily and zetia 10 mg daily ldl is 206  3. Gerd: will continue protonix 40 mg daily   4. BPH with urine outlet obstruction. His foley was removed this hospitalization; will continue flomax 0.4 mg daily   5. CAD: no complaints of chest pain; will continue asa 81 mg daily; imdur 30 mg daily will monitor  6. CVA: with left hemiparesis; is neurologically stable will continue asa 81 mg daily and eliquis 5 mg twice daily   7. Lower extremity edema: will continue lasix 20 mg daily   8. Afib: heart rate stable; will continue eliquis 5 mg twice daily and coreg 3.125 mg twice daily for rate control   9. Constipation: will continue senna s daily as needed  10. UTI; will complete keflex and will continue to monitor his status.   11. Anemia: hbg is 9.3 will monitor  Will check cbc; bmp   Time spent with patient   50  minutes >50% time spent counseling; reviewing medical record; tests; labs; and developing future plan of care   Ok Edwards NP [facility]  Contact [phone] Monday through Friday 8am- 5pm  After hours call [phone]

## 2015-11-12 ENCOUNTER — Non-Acute Institutional Stay (SKILLED_NURSING_FACILITY): Payer: Medicare Other | Admitting: Internal Medicine

## 2015-11-12 ENCOUNTER — Encounter: Payer: Self-pay | Admitting: Internal Medicine

## 2015-11-12 DIAGNOSIS — G8194 Hemiplegia, unspecified affecting left nondominant side: Secondary | ICD-10-CM | POA: Diagnosis not present

## 2015-11-12 DIAGNOSIS — E785 Hyperlipidemia, unspecified: Secondary | ICD-10-CM | POA: Diagnosis not present

## 2015-11-12 DIAGNOSIS — I6521 Occlusion and stenosis of right carotid artery: Secondary | ICD-10-CM | POA: Diagnosis not present

## 2015-11-12 DIAGNOSIS — I480 Paroxysmal atrial fibrillation: Secondary | ICD-10-CM

## 2015-11-12 DIAGNOSIS — N390 Urinary tract infection, site not specified: Secondary | ICD-10-CM

## 2015-11-12 DIAGNOSIS — R7303 Prediabetes: Secondary | ICD-10-CM

## 2015-11-12 DIAGNOSIS — I6930 Unspecified sequelae of cerebral infarction: Secondary | ICD-10-CM

## 2015-11-12 DIAGNOSIS — D638 Anemia in other chronic diseases classified elsewhere: Secondary | ICD-10-CM

## 2015-11-12 DIAGNOSIS — I10 Essential (primary) hypertension: Secondary | ICD-10-CM

## 2015-11-12 DIAGNOSIS — N400 Enlarged prostate without lower urinary tract symptoms: Secondary | ICD-10-CM

## 2015-11-12 DIAGNOSIS — N183 Chronic kidney disease, stage 3 unspecified: Secondary | ICD-10-CM

## 2015-11-12 NOTE — Progress Notes (Signed)
Patient ID: [name], male   DOB: 1940-11-17, 75 y.o.   MRN: [id number]    HISTORY AND PHYSICAL   DATE: 11/12/15  Location:  [facility] Room Number: 145 B Place of Service: SNF (31)   Extended Emergency Contact Information Primary Emergency Contact: [place] Phone: [phone] Relation: Friend  Advanced Directive information Does patient have an advance directive?: No, Would patient like information on creating an advanced directive?: No - patient declined information  Chief Complaint  Patient presents with  . New Admit To SNF    HPI:  75 yo male seen today as a new admission into SNF following hospital stay for UTI with sepsis, recent CVA (right PCA) with left hemiplegia, PAF, A/CKD stage 3, carotid stenosis s/p recent right CEA, HTN, hyperlipidemia, AOCD, CAD, BPH. He presented to the ED with urinary retention and foley cath inserted. UA (+) UTI with cx (+) E coli pansensitive. He was rx keflex. Cr 1.45 --> 1.21. Albumin 2.4. BC neg x 5 days. H/H 8.1/26.3 at d/c. A1c 6.2%. He presents for short term rehab.  He c/o feeling lightheaded intermittently. Urinary issues resolved. He will complete keflex this week. No nursing issues. Tolerating PT. No falls. Appetite is excellent and sleeping well  Hypertension - stable on norvasc 10 mg daily; coreg 3.125 mg twice daily  Dyslipidemia - stable on lipitor 80 mg daily and zetia 10 mg daily. LDL 206  GERD- stable on protonix 40 mg daily   BPH with urine outlet obstruction - stable on flomax 0.4 mg daily   Hx CAD- currently no chest pain. Taking  asa 81 mg daily; imdur 30 mg daily   Recent CVA with left hemiparesis - stable on asa 81 mg daily and eliquis 5 mg twice daily. Pain controlled on prn oxyIR  Lower extremity edema - stable on lasix 20 mg daily   PAF - heart rate controlled on coreg. Takes eliquis 5 mg twice daily for anticoagulation  Anemia of  chronic disease/hx iron deficiency - stable. hgb 8.1 at d/c   Past Medical History  Diagnosis Date  . Hypertension     echocardiogram 6/10: Moderate LVH, EF 55-65%, mild, mild MR, MAC, mild LAE, PASP 32  . Dyslipidemia   . History of stroke   . CAD (coronary artery disease)     cath in 1/08: EF 60%, mild dilated Ao root, oD2 30%, LAD 20-30%.  . Paroxysmal atrial fibrillation ([place])   . Anemia, iron deficiency 08/15/2011  . History of pneumonia 08/15/2011  . History of MI (myocardial infarction) 08/15/2011  . Dilated aortic root ([place]) 08/15/2011  . Stroke ([place]) 10/2011  . Myocardial infarction ([place])   . Hyperlipidemia   . Shortness of breath dyspnea   . Prediabetes 10/08/2015  . Preoperative cardiovascular examination 10/09/2015  . Dysrhythmia     Past Surgical History  Procedure Laterality Date  . Cardiac catheterization    . Endarterectomy Right 10/10/2015    Procedure: ENDARTERECTOMY CAROTID-RIGHT;  Surgeon: Serafina Mitchell, MD;  Location: [facility];  Service: Vascular;  Laterality: Right;    Patient Care Team: Biagio Borg, MD as PCP - General (Internal Medicine)  Social History   Social History  . Marital Status: Widowed    Spouse Name: N/A  . Number of Children: 2  . Years of Education: N/A   Occupational History  . retired    Social History Main Topics  . Smoking status:  Never Smoker   . Smokeless tobacco: Never Used  . Alcohol Use: No  . Drug Use: No  . Sexual Activity: Not on file   Other Topics Concern  . Not on file   Social History Narrative     reports that he has never smoked. He has never used smokeless tobacco. He reports that he does not drink alcohol or use illicit drugs.  Family History  Problem Relation Age of Onset  . Colon cancer Neg Hx   . Aneurysm Sister    No family status information on file.    Immunization History  Administered Date(s) Administered  . PPD Test 11/07/2015  . Pneumococcal Conjugate-13 06/26/2014  . Pneumococcal  Polysaccharide-23 08/20/2011    No Known Allergies  Medications: Patient's Medications  New Prescriptions   No medications on file  Previous Medications   ALBUTEROL (PROVENTIL HFA;VENTOLIN HFA) 108 (90 BASE) MCG/ACT INHALER    Inhale 2 puffs into the lungs every 6 (six) hours as needed for wheezing or shortness of breath.   AMLODIPINE (NORVASC) 10 MG TABLET    Take 1 tablet (10 mg total) by mouth daily.   APIXABAN (ELIQUIS) 5 MG TABS TABLET    Take 1 tablet (5 mg total) by mouth 2 (two) times daily.   ASPIRIN EC 81 MG TABLET    Take 1 tablet (81 mg total) by mouth daily.   ATORVASTATIN (LIPITOR) 80 MG TABLET    Take 1 tablet (80 mg total) by mouth daily.   CARVEDILOL (COREG) 3.125 MG TABLET    Take 3.125 mg by mouth 2 (two) times daily.   CEPHALEXIN (KEFLEX) 500 MG CAPSULE    Take 1 capsule (500 mg total) by mouth every 8 (eight) hours.   EZETIMIBE (ZETIA) 10 MG TABLET    Take 1 tablet (10 mg total) by mouth daily.   FUROSEMIDE (LASIX) 20 MG TABLET    Take 1 tablet (20 mg total) by mouth daily.   ISOSORBIDE MONONITRATE (IMDUR) 30 MG 24 HR TABLET    Take 1 tablet (30 mg total) by mouth daily.   OXYCODONE (OXY IR/ROXICODONE) 5 MG IMMEDIATE RELEASE TABLET    Take 1-2 tablets (5-10 mg total) by mouth every 6 (six) hours as needed for moderate pain.   PANTOPRAZOLE (PROTONIX) 40 MG TABLET    Take 1 tablet (40 mg total) by mouth daily.   SENNA-DOCUSATE (SENOKOT-S) 8.6-50 MG TABLET    Take 1 tablet by mouth at bedtime as needed for mild constipation.   TAMSULOSIN (FLOMAX) 0.4 MG CAPS CAPSULE    Take 1 capsule (0.4 mg total) by mouth daily after supper.  Modified Medications   No medications on file  Discontinued Medications   No medications on file    Review of Systems  Neurological: Positive for weakness and light-headedness.  All other systems reviewed and are negative.   Filed Vitals:   11/12/15 1446  BP: 136/66  Pulse: 74  Temp: 98.5 F (36.9 C)  TempSrc: Oral  Resp: 16    Height: _0  (1.93 m)  Weight: 167 lb (75.751 kg)   Body mass index is 20.34 kg/(m^2).  Physical Exam  Constitutional: He is oriented to person, place, and time. He appears well-developed and well-nourished.  Sitting in w/c in NAD  HENT:  Mouth/Throat: Oropharynx is clear and moist.  Eyes: Pupils are equal, round, and reactive to light. No scleral icterus.  Neck: Neck supple. Carotid bruit is present (L>R). No thyromegaly present.  Cardiovascular: Normal rate,  regular rhythm and intact distal pulses.  Exam reveals no gallop and no friction rub.   Murmur (1/6 SEM) heard. no distal LE swelling. No calf TTP  Pulmonary/Chest: Effort normal and breath sounds normal. He has no wheezes. He has no rales. He exhibits no tenderness.  Abdominal: Soft. Bowel sounds are normal. He exhibits no distension, no abdominal bruit, no pulsatile midline mass and no mass. There is no tenderness. There is no rebound and no guarding.  Musculoskeletal: He exhibits edema.  Lymphadenopathy:    He has no cervical adenopathy.  Neurological: He is alert and oriented to person, place, and time. He displays no tremor. Gait abnormal.  Left leg hemiplegia  Skin: Skin is warm and dry. No rash noted.  Psychiatric: He has a normal mood and affect. His behavior is normal. Thought content normal.     Labs reviewed: Admission on 11/05/2015, Discharged on 11/07/2015  Component Date Value Ref Range Status  . Sodium 11/05/2015 138  135 - 145 mmol/L Final  . Potassium 11/05/2015 3.9  3.5 - 5.1 mmol/L Final  . Chloride 11/05/2015 100* 101 - 111 mmol/L Final  . CO2 11/05/2015 24  22 - 32 mmol/L Final  . Glucose, Bld 11/05/2015 114* 65 - 99 mg/dL Final  . BUN 11/05/2015 19  6 - 20 mg/dL Final  . Creatinine, Ser 11/05/2015 1.45* 0.61 - 1.24 mg/dL Final  . Calcium 11/05/2015 9.3  8.9 - 10.3 mg/dL Final  . Total Protein 11/05/2015 8.0  6.5 - 8.1 g/dL Final  . Albumin 11/05/2015 3.2* 3.5 - 5.0 g/dL Final  . AST 11/05/2015 19   15 - 41 U/L Final  . ALT 11/05/2015 11* 17 - 63 U/L Final  . Alkaline Phosphatase 11/05/2015 49  38 - 126 U/L Final  . Total Bilirubin 11/05/2015 0.5  0.3 - 1.2 mg/dL Final  . GFR calc non Af Amer 11/05/2015 46* >60 mL/min Final  . GFR calc Af Amer 11/05/2015 54* >60 mL/min Final   Comment: (NOTE) The eGFR has been calculated using the CKD EPI equation. This calculation has not been validated in all clinical situations. eGFR's persistently <60 mL/min signify possible Chronic Kidney Disease.   . Anion gap 11/05/2015 14  5 - 15 Final  . Lactic Acid, Venous 11/05/2015 2.17* 0.5 - 2.0 mmol/L Final  . Comment 11/05/2015 NOTIFIED PHYSICIAN   Final  . WBC 11/05/2015 8.3  4.0 - 10.5 K/uL Final  . RBC 11/05/2015 4.64  4.22 - 5.81 MIL/uL Final  . Hemoglobin 11/05/2015 9.3* 13.0 - 17.0 g/dL Final  . HCT 11/05/2015 30.7* 39.0 - 52.0 % Final  . MCV 11/05/2015 66.2* 78.0 - 100.0 fL Final  . MCH 11/05/2015 20.0* 26.0 - 34.0 pg Final  . MCHC 11/05/2015 30.3  30.0 - 36.0 g/dL Final  . RDW 11/05/2015 14.9  11.5 - 15.5 % Final  . Platelets 11/05/2015 219  150 - 400 K/uL Final  . Neutrophils Relative % 11/05/2015 80   Final  . Lymphocytes Relative 11/05/2015 10   Final  . Monocytes Relative 11/05/2015 9   Final  . Eosinophils Relative 11/05/2015 1   Final  . Basophils Relative 11/05/2015 0   Final  . Neutro Abs 11/05/2015 6.7  1.7 - 7.7 K/uL Final  . Lymphs Abs 11/05/2015 0.8  0.7 - 4.0 K/uL Final  . Monocytes Absolute 11/05/2015 0.7  0.1 - 1.0 K/uL Final  . Eosinophils Absolute 11/05/2015 0.1  0.0 - 0.7 K/uL Final  . Basophils Absolute 11/05/2015  0.0  0.0 - 0.1 K/uL Final  . Specimen Description 11/05/2015 BLOOD LEFT FOREARM   Final  . Special Requests 11/05/2015 BOTTLES DRAWN AEROBIC AND ANAEROBIC 5CC   Final  . Culture 11/05/2015 NO GROWTH 5 DAYS   Final  . Report Status 11/05/2015 11/10/2015 FINAL   Final  . Specimen Description 11/05/2015 BLOOD RIGHT HAND   Final  . Special Requests  11/05/2015 BOTTLES DRAWN AEROBIC AND ANAEROBIC 5CC   Final  . Culture 11/05/2015 NO GROWTH 5 DAYS   Final  . Report Status 11/05/2015 11/10/2015 FINAL   Final  . Color, Urine 11/05/2015 YELLOW  YELLOW Final  . APPearance 11/05/2015 TURBID* CLEAR Final  . Specific Gravity, Urine 11/05/2015 1.007  1.005 - 1.030 Final  . pH 11/05/2015 5.5  5.0 - 8.0 Final  . Glucose, UA 11/05/2015 NEGATIVE  NEGATIVE mg/dL Final  . Hgb urine dipstick 11/05/2015 LARGE* NEGATIVE Final  . Bilirubin Urine 11/05/2015 NEGATIVE  NEGATIVE Final  . Ketones, ur 11/05/2015 NEGATIVE  NEGATIVE mg/dL Final  . Protein, ur 11/05/2015 30* NEGATIVE mg/dL Final  . Nitrite 11/05/2015 NEGATIVE  NEGATIVE Final  . Leukocytes, UA 11/05/2015 LARGE* NEGATIVE Final  . Specimen Description 11/05/2015 URINE, RANDOM   Final  . Special Requests 11/05/2015 NONE   Final  . Culture 11/05/2015 >=100,000 COLONIES/mL ESCHERICHIA COLI*  Final  . Report Status 11/05/2015 11/07/2015 FINAL   Final  . Organism ID, Bacteria 11/05/2015 ESCHERICHIA COLI*  Final  . Influenza A By PCR 11/05/2015 NEGATIVE  NEGATIVE Final  . Influenza B By PCR 11/05/2015 NEGATIVE  NEGATIVE Final  . H1N1 flu by pcr 11/05/2015 NOT DETECTED  NOT DETECTED Final   Comment:        The Xpert Flu assay (FDA approved for nasal aspirates or washes and nasopharyngeal swab specimens), is intended as an aid in the diagnosis of influenza and should not be used as a sole basis for treatment.   . Squamous Epithelial / LPF 11/05/2015 0-5* NONE SEEN Final  . WBC, UA 11/05/2015 TOO NUMEROUS TO COUNT  0 - 5 WBC/hpf Final  . RBC / HPF 11/05/2015 6-30  0 - 5 RBC/hpf Final  . Bacteria, UA 11/05/2015 MANY* NONE SEEN Final  . WBC 11/06/2015 8.2  4.0 - 10.5 K/uL Final   REPEATED TO VERIFY  . RBC 11/06/2015 3.98* 4.22 - 5.81 MIL/uL Final  . Hemoglobin 11/06/2015 8.1* 13.0 - 17.0 g/dL Final   REPEATED TO VERIFY  . HCT 11/06/2015 26.3* 39.0 - 52.0 % Final  . MCV 11/06/2015 66.1* 78.0 -  100.0 fL Final  . MCH 11/06/2015 20.4* 26.0 - 34.0 pg Final  . MCHC 11/06/2015 30.8  30.0 - 36.0 g/dL Final  . RDW 11/06/2015 14.7  11.5 - 15.5 % Final  . Platelets 11/06/2015 197  150 - 400 K/uL Final   REPEATED TO VERIFY  . Neutrophils Relative % 11/06/2015 75   Final  . Lymphocytes Relative 11/06/2015 18   Final  . Monocytes Relative 11/06/2015 6   Final  . Eosinophils Relative 11/06/2015 1   Final  . Basophils Relative 11/06/2015 0   Final  . Neutro Abs 11/06/2015 6.1  1.7 - 7.7 K/uL Final  . Lymphs Abs 11/06/2015 1.5  0.7 - 4.0 K/uL Final  . Monocytes Absolute 11/06/2015 0.5  0.1 - 1.0 K/uL Final  . Eosinophils Absolute 11/06/2015 0.1  0.0 - 0.7 K/uL Final  . Basophils Absolute 11/06/2015 0.0  0.0 - 0.1 K/uL Final  . RBC Morphology  11/06/2015 TARGET CELLS   Final   ELLIPTOCYTES  . Sodium 11/06/2015 141  135 - 145 mmol/L Final  . Potassium 11/06/2015 4.0  3.5 - 5.1 mmol/L Final  . Chloride 11/06/2015 105  101 - 111 mmol/L Final  . CO2 11/06/2015 25  22 - 32 mmol/L Final  . Glucose, Bld 11/06/2015 103* 65 - 99 mg/dL Final  . BUN 11/06/2015 14  6 - 20 mg/dL Final  . Creatinine, Ser 11/06/2015 1.21  0.61 - 1.24 mg/dL Final  . Calcium 11/06/2015 8.5* 8.9 - 10.3 mg/dL Final  . Total Protein 11/06/2015 6.9  6.5 - 8.1 g/dL Final  . Albumin 11/06/2015 2.4* 3.5 - 5.0 g/dL Final  . AST 11/06/2015 15  15 - 41 U/L Final  . ALT 11/06/2015 9* 17 - 63 U/L Final  . Alkaline Phosphatase 11/06/2015 41  38 - 126 U/L Final  . Total Bilirubin 11/06/2015 0.4  0.3 - 1.2 mg/dL Final  . GFR calc non Af Amer 11/06/2015 58* >60 mL/min Final  . GFR calc Af Amer 11/06/2015 >60  >60 mL/min Final   Comment: (NOTE) The eGFR has been calculated using the CKD EPI equation. This calculation has not been validated in all clinical situations. eGFR's persistently <60 mL/min signify possible Chronic Kidney Disease.   . Anion gap 11/06/2015 11  5 - 15 Final  . Lactic Acid, Venous 11/05/2015 0.8  0.5 - 2.0  mmol/L Final  . Lactic Acid, Venous 11/05/2015 1.3  0.5 - 2.0 mmol/L Final  . Prothrombin Time 11/05/2015 15.7* 11.6 - 15.2 seconds Final  . INR 11/05/2015 1.23  0.00 - 1.49 Final  . aPTT 11/05/2015 22* 24 - 37 seconds Final  . Troponin I 11/05/2015 0.03  <0.031 ng/mL Final   Comment:        NO INDICATION OF MYOCARDIAL INJURY.   . Troponin I 11/06/2015 0.03  <0.031 ng/mL Final   Comment:        NO INDICATION OF MYOCARDIAL INJURY.   . Troponin I 11/06/2015 0.04* <0.031 ng/mL Final   Comment:        PERSISTENTLY INCREASED TROPONIN VALUES IN THE RANGE OF 0.04-0.49 ng/mL CAN BE SEEN IN:       -UNSTABLE ANGINA       -CONGESTIVE HEART FAILURE       -MYOCARDITIS       -CHEST TRAUMA       -ARRYHTHMIAS       -LATE PRESENTING MYOCARDIAL INFARCTION       -COPD   CLINICAL FOLLOW-UP RECOMMENDED.   Admission on 10/14/2015, Discharged on 10/21/2015  Component Date Value Ref Range Status  . WBC 10/15/2015 6.3  4.0 - 10.5 K/uL Final  . RBC 10/15/2015 4.57  4.22 - 5.81 MIL/uL Final  . Hemoglobin 10/15/2015 9.5* 13.0 - 17.0 g/dL Final  . HCT 10/15/2015 31.4* 39.0 - 52.0 % Final  . MCV 10/15/2015 68.7* 78.0 - 100.0 fL Final  . MCH 10/15/2015 20.8* 26.0 - 34.0 pg Final  . MCHC 10/15/2015 30.3  30.0 - 36.0 g/dL Final  . RDW 10/15/2015 15.4  11.5 - 15.5 % Final  . Platelets 10/15/2015 190  150 - 400 K/uL Final  . Neutrophils Relative % 10/15/2015 64   Final  . Lymphocytes Relative 10/15/2015 24   Final  . Monocytes Relative 10/15/2015 9   Final  . Eosinophils Relative 10/15/2015 3   Final  . Basophils Relative 10/15/2015 0   Final  . Neutro Abs 10/15/2015 4.0  1.7 - 7.7 K/uL Final  . Lymphs Abs 10/15/2015 1.5  0.7 - 4.0 K/uL Final  . Monocytes Absolute 10/15/2015 0.6  0.1 - 1.0 K/uL Final  . Eosinophils Absolute 10/15/2015 0.2  0.0 - 0.7 K/uL Final  . Basophils Absolute 10/15/2015 0.0  0.0 - 0.1 K/uL Final  . RBC Morphology 10/15/2015 POLYCHROMASIA PRESENT   Final  . Sodium 10/15/2015 138   135 - 145 mmol/L Final  . Potassium 10/15/2015 3.4* 3.5 - 5.1 mmol/L Final  . Chloride 10/15/2015 104  101 - 111 mmol/L Final  . CO2 10/15/2015 27  22 - 32 mmol/L Final  . Glucose, Bld 10/15/2015 96  65 - 99 mg/dL Final  . BUN 10/15/2015 10  6 - 20 mg/dL Final  . Creatinine, Ser 10/15/2015 0.90  0.61 - 1.24 mg/dL Final  . Calcium 10/15/2015 8.5* 8.9 - 10.3 mg/dL Final  . Total Protein 10/15/2015 7.0  6.5 - 8.1 g/dL Final  . Albumin 10/15/2015 2.9* 3.5 - 5.0 g/dL Final  . AST 10/15/2015 21  15 - 41 U/L Final  . ALT 10/15/2015 15* 17 - 63 U/L Final  . Alkaline Phosphatase 10/15/2015 46  38 - 126 U/L Final  . Total Bilirubin 10/15/2015 0.3  0.3 - 1.2 mg/dL Final  . GFR calc non Af Amer 10/15/2015 >60  >60 mL/min Final  . GFR calc Af Amer 10/15/2015 >60  >60 mL/min Final   Comment: (NOTE) The eGFR has been calculated using the CKD EPI equation. This calculation has not been validated in all clinical situations. eGFR's persistently <60 mL/min signify possible Chronic Kidney Disease.   . Anion gap 10/15/2015 7  5 - 15 Final  . WBC 10/21/2015 5.5  4.0 - 10.5 K/uL Final  . RBC 10/21/2015 4.03* 4.22 - 5.81 MIL/uL Final  . Hemoglobin 10/21/2015 8.7* 13.0 - 17.0 g/dL Final  . HCT 10/21/2015 27.2* 39.0 - 52.0 % Final  . MCV 10/21/2015 67.5* 78.0 - 100.0 fL Final  . MCH 10/21/2015 21.6* 26.0 - 34.0 pg Final  . MCHC 10/21/2015 32.0  30.0 - 36.0 g/dL Final  . RDW 10/21/2015 15.0  11.5 - 15.5 % Final  . Platelets 10/21/2015 235  150 - 400 K/uL Final  . Neutrophils Relative % 10/21/2015 63   Final  . Lymphocytes Relative 10/21/2015 27   Final  . Monocytes Relative 10/21/2015 8   Final  . Eosinophils Relative 10/21/2015 2   Final  . Basophils Relative 10/21/2015 0   Final  . Neutro Abs 10/21/2015 3.5  1.7 - 7.7 K/uL Final  . Lymphs Abs 10/21/2015 1.5  0.7 - 4.0 K/uL Final  . Monocytes Absolute 10/21/2015 0.4  0.1 - 1.0 K/uL Final  . Eosinophils Absolute 10/21/2015 0.1  0.0 - 0.7 K/uL Final  .  Basophils Absolute 10/21/2015 0.0  0.0 - 0.1 K/uL Final  . RBC Morphology 10/21/2015 ELLIPTOCYTES   Final   POLYCHROMASIA PRESENT  . Sodium 10/21/2015 138  135 - 145 mmol/L Final  . Potassium 10/21/2015 4.0  3.5 - 5.1 mmol/L Final  . Chloride 10/21/2015 102  101 - 111 mmol/L Final  . CO2 10/21/2015 25  22 - 32 mmol/L Final  . Glucose, Bld 10/21/2015 88  65 - 99 mg/dL Final  . BUN 10/21/2015 16  6 - 20 mg/dL Final  . Creatinine, Ser 10/21/2015 1.02  0.61 - 1.24 mg/dL Final  . Calcium 10/21/2015 8.7* 8.9 - 10.3 mg/dL Final  . GFR calc non Af Amer 10/21/2015 >  60  >60 mL/min Final  . GFR calc Af Amer 10/21/2015 >60  >60 mL/min Final   Comment: (NOTE) The eGFR has been calculated using the CKD EPI equation. This calculation has not been validated in all clinical situations. eGFR's persistently <60 mL/min signify possible Chronic Kidney Disease.   . Anion gap 10/21/2015 11  5 - 15 Final  Admission on 10/06/2015, Discharged on 10/14/2015  Component Date Value Ref Range Status  . Prothrombin Time 10/06/2015 14.7  11.6 - 15.2 seconds Final  . INR 10/06/2015 1.13  0.00 - 1.49 Final  . aPTT 10/06/2015 26  24 - 37 seconds Final  . WBC 10/06/2015 4.5  4.0 - 10.5 K/uL Final  . RBC 10/06/2015 5.82* 4.22 - 5.81 MIL/uL Final  . Hemoglobin 10/06/2015 12.6* 13.0 - 17.0 g/dL Final  . HCT 10/06/2015 39.8  39.0 - 52.0 % Final  . MCV 10/06/2015 68.4* 78.0 - 100.0 fL Final  . MCH 10/06/2015 21.6* 26.0 - 34.0 pg Final  . MCHC 10/06/2015 31.7  30.0 - 36.0 g/dL Final  . RDW 10/06/2015 16.0* 11.5 - 15.5 % Final  . Platelets 10/06/2015 181  150 - 400 K/uL Final  . Neutrophils Relative % 10/06/2015 62   Final  . Lymphocytes Relative 10/06/2015 32   Final  . Monocytes Relative 10/06/2015 5   Final  . Eosinophils Relative 10/06/2015 1   Final  . Basophils Relative 10/06/2015 0   Final  . Neutro Abs 10/06/2015 2.9  1.7 - 7.7 K/uL Final  . Lymphs Abs 10/06/2015 1.4  0.7 - 4.0 K/uL Final  . Monocytes Absolute  10/06/2015 0.2  0.1 - 1.0 K/uL Final  . Eosinophils Absolute 10/06/2015 0.0  0.0 - 0.7 K/uL Final  . Basophils Absolute 10/06/2015 0.0  0.0 - 0.1 K/uL Final  . Sodium 10/06/2015 138  135 - 145 mmol/L Final  . Potassium 10/06/2015 3.5  3.5 - 5.1 mmol/L Final  . Chloride 10/06/2015 102  101 - 111 mmol/L Final  . CO2 10/06/2015 26  22 - 32 mmol/L Final  . Glucose, Bld 10/06/2015 76  65 - 99 mg/dL Final  . BUN 10/06/2015 14  6 - 20 mg/dL Final  . Creatinine, Ser 10/06/2015 1.10  0.61 - 1.24 mg/dL Final  . Calcium 10/06/2015 9.1  8.9 - 10.3 mg/dL Final  . Total Protein 10/06/2015 7.6  6.5 - 8.1 g/dL Final  . Albumin 10/06/2015 3.5  3.5 - 5.0 g/dL Final  . AST 10/06/2015 22  15 - 41 U/L Final  . ALT 10/06/2015 12* 17 - 63 U/L Final  . Alkaline Phosphatase 10/06/2015 57  38 - 126 U/L Final  . Total Bilirubin 10/06/2015 0.5  0.3 - 1.2 mg/dL Final  . GFR calc non Af Amer 10/06/2015 >60  >60 mL/min Final  . GFR calc Af Amer 10/06/2015 >60  >60 mL/min Final   Comment: (NOTE) The eGFR has been calculated using the CKD EPI equation. This calculation has not been validated in all clinical situations. eGFR's persistently <60 mL/min signify possible Chronic Kidney Disease.   . Anion gap 10/06/2015 10  5 - 15 Final  . Troponin i, poc 10/06/2015 0.02  0.00 - 0.08 ng/mL Final  . Comment 3 10/06/2015          Final   Comment: Due to the release kinetics of cTnI, a negative result within the first hours of the onset of symptoms does not rule out myocardial infarction with certainty. If myocardial infarction is  still suspected, repeat the test at appropriate intervals.   . Glucose-Capillary 10/06/2015 61* 65 - 99 mg/dL Final  . Comment 1 10/06/2015 Notify RN   Final  . Comment 2 10/06/2015 Document in Chart   Final  . Sodium 10/06/2015 142  135 - 145 mmol/L Final  . Potassium 10/06/2015 3.6  3.5 - 5.1 mmol/L Final  . Chloride 10/06/2015 102  101 - 111 mmol/L Final  . BUN 10/06/2015 18  6 - 20  mg/dL Final  . Creatinine, Ser 10/06/2015 1.00  0.61 - 1.24 mg/dL Final  . Glucose, Bld 10/06/2015 78  65 - 99 mg/dL Final  . Calcium, Ion 10/06/2015 1.17  1.13 - 1.30 mmol/L Final  . TCO2 10/06/2015 27  0 - 100 mmol/L Final  . Hemoglobin 10/06/2015 15.6  13.0 - 17.0 g/dL Final  . HCT 10/06/2015 46.0  39.0 - 52.0 % Final  . Hgb A1c MFr Bld 10/07/2015 6.2* 4.8 - 5.6 % Final   Comment: (NOTE)         Pre-diabetes: 5.7 - 6.4         Diabetes: >6.4         Glycemic control for adults with diabetes: <7.0   . Mean Plasma Glucose 10/07/2015 131   Final   Comment: (NOTE) Performed At: [facility] [place], [place] [id number] Lindon Romp MD GQ:[id number]   . Cholesterol 10/07/2015 262* 0 - 200 mg/dL Final  . Triglycerides 10/07/2015 118  <150 mg/dL Final  . HDL 10/07/2015 32* >40 mg/dL Final  . Total CHOL/HDL Ratio 10/07/2015 8.2   Final  . VLDL 10/07/2015 24  0 - 40 mg/dL Final  . LDL Cholesterol 10/07/2015 206* 0 - 99 mg/dL Final   Comment:        Total Cholesterol/HDL:CHD Risk Coronary Heart Disease Risk Table                     Men   Women  1/2 Average Risk   3.4   3.3  Average Risk       5.0   4.4  2 X Average Risk   9.6   7.1  3 X Average Risk  23.4   11.0        Use the calculated Patient Ratio above and the CHD Risk Table to determine the patient's CHD Risk.        ATP III CLASSIFICATION (LDL):  <100     mg/dL   Optimal  100-129  mg/dL   Near or Above                    Optimal  130-159  mg/dL   Borderline  160-189  mg/dL   High  >190     mg/dL   Very High   . WBC 10/08/2015 5.6  4.0 - 10.5 K/uL Final  . RBC 10/08/2015 5.61  4.22 - 5.81 MIL/uL Final  . Hemoglobin 10/08/2015 12.2* 13.0 - 17.0 g/dL Final  . HCT 10/08/2015 38.4* 39.0 - 52.0 % Final  . MCV 10/08/2015 68.4* 78.0 - 100.0 fL Final  . MCH 10/08/2015 21.7* 26.0 - 34.0 pg Final  . MCHC 10/08/2015 31.8  30.0 - 36.0 g/dL Final  . RDW 10/08/2015 16.0* 11.5 - 15.5 % Final  .  Platelets 10/08/2015 178  150 - 400 K/uL Final  . Activated Clotting Time 10/08/2015 173   Final  . Opiates 10/09/2015 NONE DETECTED  NONE DETECTED Final  .  Cocaine 10/09/2015 NONE DETECTED  NONE DETECTED Final  . Benzodiazepines 10/09/2015 POSITIVE* NONE DETECTED Final  . Amphetamines 10/09/2015 NONE DETECTED  NONE DETECTED Final  . Tetrahydrocannabinol 10/09/2015 NONE DETECTED  NONE DETECTED Final  . Barbiturates 10/09/2015 NONE DETECTED  NONE DETECTED Final   Comment:        DRUG SCREEN FOR MEDICAL PURPOSES ONLY.  IF CONFIRMATION IS NEEDED FOR ANY PURPOSE, NOTIFY LAB WITHIN 5 DAYS.        LOWEST DETECTABLE LIMITS FOR URINE DRUG SCREEN Drug Class       Cutoff (ng/mL) Amphetamine      1000 Barbiturate      200 Benzodiazepine   379 Tricyclics       024 Opiates          300 Cocaine          300 THC              50   . Sodium 10/10/2015 139  135 - 145 mmol/L Final  . Potassium 10/10/2015 3.7  3.5 - 5.1 mmol/L Final  . Chloride 10/10/2015 107  101 - 111 mmol/L Final  . CO2 10/10/2015 22  22 - 32 mmol/L Final  . Glucose, Bld 10/10/2015 95  65 - 99 mg/dL Final  . BUN 10/10/2015 19  6 - 20 mg/dL Final  . Creatinine, Ser 10/10/2015 1.11  0.61 - 1.24 mg/dL Final  . Calcium 10/10/2015 8.7* 8.9 - 10.3 mg/dL Final  . GFR calc non Af Amer 10/10/2015 >60  >60 mL/min Final  . GFR calc Af Amer 10/10/2015 >60  >60 mL/min Final   Comment: (NOTE) The eGFR has been calculated using the CKD EPI equation. This calculation has not been validated in all clinical situations. eGFR's persistently <60 mL/min signify possible Chronic Kidney Disease.   . Anion gap 10/10/2015 10  5 - 15 Final  . WBC 10/10/2015 5.8  4.0 - 10.5 K/uL Final  . RBC 10/10/2015 5.28  4.22 - 5.81 MIL/uL Final  . Hemoglobin 10/10/2015 11.4* 13.0 - 17.0 g/dL Final  . HCT 10/10/2015 35.9* 39.0 - 52.0 % Final  . MCV 10/10/2015 68.0* 78.0 - 100.0 fL Final  . MCH 10/10/2015 21.6* 26.0 - 34.0 pg Final  . MCHC 10/10/2015 31.8   30.0 - 36.0 g/dL Final  . RDW 10/10/2015 16.0* 11.5 - 15.5 % Final  . Platelets 10/10/2015 165  150 - 400 K/uL Final  . MRSA, PCR 10/10/2015 NEGATIVE  NEGATIVE Final  . Staphylococcus aureus 10/10/2015 NEGATIVE  NEGATIVE Final   Comment:        The Xpert SA Assay (FDA approved for NASAL specimens in patients over 54 years of age), is one component of a comprehensive surveillance program.  Test performance has been validated by [facility] for patients greater than or equal to 65 year old. It is not intended to diagnose infection nor to guide or monitor treatment.   . ABO/RH(D) 10/10/2015 B POS   Final  . Antibody Screen 10/10/2015 NEG   Final  . Sample Expiration 10/10/2015 10/13/2015   Final  . ABO/RH(D) 10/10/2015 B POS   Final  . WBC 10/11/2015 8.7  4.0 - 10.5 K/uL Final  . RBC 10/11/2015 4.72  4.22 - 5.81 MIL/uL Final  . Hemoglobin 10/11/2015 10.1* 13.0 - 17.0 g/dL Final  . HCT 10/11/2015 32.0* 39.0 - 52.0 % Final  . MCV 10/11/2015 67.8* 78.0 - 100.0 fL Final  . MCH 10/11/2015 21.4* 26.0 - 34.0 pg Final  .  MCHC 10/11/2015 31.6  30.0 - 36.0 g/dL Final  . RDW 10/11/2015 15.9* 11.5 - 15.5 % Final  . Platelets 10/11/2015 164  150 - 400 K/uL Final  . Sodium 10/11/2015 136  135 - 145 mmol/L Final  . Potassium 10/11/2015 4.0  3.5 - 5.1 mmol/L Final  . Chloride 10/11/2015 106  101 - 111 mmol/L Final  . CO2 10/11/2015 23  22 - 32 mmol/L Final  . Glucose, Bld 10/11/2015 113* 65 - 99 mg/dL Final  . BUN 10/11/2015 15  6 - 20 mg/dL Final  . Creatinine, Ser 10/11/2015 1.08  0.61 - 1.24 mg/dL Final  . Calcium 10/11/2015 8.1* 8.9 - 10.3 mg/dL Final  . GFR calc non Af Amer 10/11/2015 >60  >60 mL/min Final  . GFR calc Af Amer 10/11/2015 >60  >60 mL/min Final   Comment: (NOTE) The eGFR has been calculated using the CKD EPI equation. This calculation has not been validated in all clinical situations. eGFR's persistently <60 mL/min signify possible Chronic Kidney Disease.   . Anion gap  10/11/2015 7  5 - 15 Final  . Glucose-Capillary 10/11/2015 138* 65 - 99 mg/dL Final  . WBC 10/12/2015 7.7  4.0 - 10.5 K/uL Final  . RBC 10/12/2015 4.62  4.22 - 5.81 MIL/uL Final  . Hemoglobin 10/12/2015 9.6* 13.0 - 17.0 g/dL Final  . HCT 10/12/2015 31.7* 39.0 - 52.0 % Final  . MCV 10/12/2015 68.6* 78.0 - 100.0 fL Final  . MCH 10/12/2015 20.8* 26.0 - 34.0 pg Final  . MCHC 10/12/2015 30.3  30.0 - 36.0 g/dL Final  . RDW 10/12/2015 15.8* 11.5 - 15.5 % Final  . Platelets 10/12/2015 171  150 - 400 K/uL Final  . Sodium 10/12/2015 141  135 - 145 mmol/L Final  . Potassium 10/12/2015 3.8  3.5 - 5.1 mmol/L Final  . Chloride 10/12/2015 106  101 - 111 mmol/L Final  . CO2 10/12/2015 27  22 - 32 mmol/L Final  . Glucose, Bld 10/12/2015 101* 65 - 99 mg/dL Final  . BUN 10/12/2015 14  6 - 20 mg/dL Final  . Creatinine, Ser 10/12/2015 0.97  0.61 - 1.24 mg/dL Final  . Calcium 10/12/2015 8.5* 8.9 - 10.3 mg/dL Final  . Total Protein 10/12/2015 6.9  6.5 - 8.1 g/dL Final  . Albumin 10/12/2015 3.0* 3.5 - 5.0 g/dL Final  . AST 10/12/2015 23  15 - 41 U/L Final  . ALT 10/12/2015 13* 17 - 63 U/L Final  . Alkaline Phosphatase 10/12/2015 42  38 - 126 U/L Final  . Total Bilirubin 10/12/2015 0.4  0.3 - 1.2 mg/dL Final  . GFR calc non Af Amer 10/12/2015 >60  >60 mL/min Final  . GFR calc Af Amer 10/12/2015 >60  >60 mL/min Final   Comment: (NOTE) The eGFR has been calculated using the CKD EPI equation. This calculation has not been validated in all clinical situations. eGFR's persistently <60 mL/min signify possible Chronic Kidney Disease.   . Anion gap 10/12/2015 8  5 - 15 Final  . Ammonia 10/12/2015 27  9 - 35 umol/L Final  . Potassium 10/14/2015 3.5  3.5 - 5.1 mmol/L Final  . Magnesium 10/14/2015 1.7  1.7 - 2.4 mg/dL Final    Dg Chest 2 View  11/05/2015  CLINICAL DATA:  Urinary tract infection, confusion EXAM: CHEST  2 VIEW COMPARISON:  06/20/2015 FINDINGS: Uncoiling of the aorta and cardiac enlargement, mild,  both stable. Vascular pattern is normal. Lungs are clear. No pleural effusions. IMPRESSION: No  active cardiopulmonary disease. Electronically Signed   By: Skipper Cliche M.D.   On: 11/05/2015 17:18     Assessment/Plan   ICD-9-CM ICD-10-CM   1. Essential hypertension 401.9 I10   2. Paroxysmal atrial fibrillation (HCC) 427.31 I48.0   3. Left hemiplegia (HCC) 342.90 G81.94   4. UTI (lower urinary tract infection) - resolving 599.0 N39.0   5. 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 BPH (benign prostatic hyperplasia) 600.00 N40.0   7. Anemia of chronic disease 285.29 D63.8   8. CKD (chronic kidney disease), stage III 585.3 N18.3   9. Carotid artery stenosis, right s/p CEA 433.10 I65.21   10. HLD (hyperlipidemia) 272.4 E78.5   11. Prediabetes - A1c 6.2% 790.29 R73.03     Check CBC and BMP  Complete keflex tx 11/14/15  PT/OT/St as ordered  F/u with specialists as scheduled  GOAL: short term rehab and d/c home when medically appropriate. Communicated with pt and nursing.  Will follow  Kiannah Grunow S. Perlie Gold  [facility] [address] [place], [place] [address] [phone] Cell (Monday-Friday 8 AM - 5 PM) [phone] After 5 PM and follow prompts

## 2015-11-27 ENCOUNTER — Encounter: Payer: Self-pay | Admitting: Adult Health

## 2015-11-27 ENCOUNTER — Non-Acute Institutional Stay (SKILLED_NURSING_FACILITY): Payer: Medicare Other | Admitting: Adult Health

## 2015-11-27 DIAGNOSIS — M7542 Impingement syndrome of left shoulder: Secondary | ICD-10-CM

## 2015-11-27 DIAGNOSIS — I480 Paroxysmal atrial fibrillation: Secondary | ICD-10-CM | POA: Diagnosis not present

## 2015-11-27 DIAGNOSIS — N183 Chronic kidney disease, stage 3 unspecified: Secondary | ICD-10-CM

## 2015-11-27 MED ORDER — OXYCODONE HCL 5 MG PO TABS: 5.0000 mg | ORAL_TABLET | Freq: Four times a day (QID) | ORAL | Status: DC | PRN

## 2015-11-27 NOTE — Progress Notes (Signed)
Patient ID: [name], male   DOB: 18-Jan-1941, 75 y.o.   MRN: [id number]    Facility: [facility]       No Known Allergies  Chief Complaint  Patient presents with  . Discharge Note    Discharge from facility    HPI:  He is being discharged to home with home health for pt/ot. He will not need dme; he has all needed equipment. He will need prescriptions to be written and will need to have his medical follow setup for him.  He had been hospitalized for sepsis with uti. He was admitted to this facility for short term rehab. He is now ready for discharge home.    Past Medical History  Diagnosis Date  . Hypertension     echocardiogram 6/10: Moderate LVH, EF 55-65%, mild, mild MR, MAC, mild LAE, PASP 32  . Dyslipidemia   . History of stroke   . CAD (coronary artery disease)     cath in 1/08: EF 60%, mild dilated Ao root, oD2 30%, LAD 20-30%.  . Paroxysmal atrial fibrillation ([place])   . Anemia, iron deficiency 08/15/2011  . History of pneumonia 08/15/2011  . History of MI (myocardial infarction) 08/15/2011  . Dilated aortic root ([place]) 08/15/2011  . Stroke ([place]) 10/2011  . Myocardial infarction ([place])   . Hyperlipidemia   . Shortness of breath dyspnea   . Prediabetes 10/08/2015  . Preoperative cardiovascular examination 10/09/2015  . Dysrhythmia     Past Surgical History  Procedure Laterality Date  . Cardiac catheterization    . Endarterectomy Right 10/10/2015    Procedure: ENDARTERECTOMY CAROTID-RIGHT;  Surgeon: Serafina Mitchell, MD;  Location: [facility] OR;  Service: Vascular;  Laterality: Right;    VITAL SIGNS BP 169/74 mmHg  Pulse 66  Temp(Src) 97.2 F (36.2 C) (Oral)  Resp 18  Ht 6\' 4"  (1.93 m)  Wt 164 lb (74.39 kg)  BMI 19.97 kg/m2  SpO2 95%  Patient's Medications  New Prescriptions   No medications on file  Previous Medications   ALBUTEROL (PROVENTIL HFA;VENTOLIN HFA) 108 (90 BASE) MCG/ACT INHALER    Inhale 2 puffs into the lungs every 6 (six) hours as needed for  wheezing or shortness of breath.   AMLODIPINE (NORVASC) 10 MG TABLET    Take 1 tablet (10 mg total) by mouth daily.   APIXABAN (ELIQUIS) 5 MG TABS TABLET    Take 1 tablet (5 mg total) by mouth 2 (two) times daily.   ASPIRIN EC 81 MG TABLET    Take 1 tablet (81 mg total) by mouth daily.   ATORVASTATIN (LIPITOR) 80 MG TABLET    Take 1 tablet (80 mg total) by mouth daily.   CARVEDILOL (COREG) 3.125 MG TABLET    Take 3.125 mg by mouth 2 (two) times daily.   EZETIMIBE (ZETIA) 10 MG TABLET    Take 1 tablet (10 mg total) by mouth daily.   FUROSEMIDE (LASIX) 20 MG TABLET    Take 1 tablet (20 mg total) by mouth daily.   ISOSORBIDE MONONITRATE (IMDUR) 30 MG 24 HR TABLET    Take 1 tablet (30 mg total) by mouth daily.   OXYCODONE (OXY IR/ROXICODONE) 5 MG IMMEDIATE RELEASE TABLET    Take 1-2 tablets (5-10 mg total) by mouth every 6 (six) hours as needed for moderate pain.   PANTOPRAZOLE (PROTONIX) 40 MG TABLET    Take 1 tablet (40 mg total) by mouth daily.   SENNA-DOCUSATE (SENOKOT-S) 8.6-50 MG TABLET  Take 1 tablet by mouth at bedtime as needed for mild constipation.   TAMSULOSIN (FLOMAX) 0.4 MG CAPS CAPSULE    Take 1 capsule (0.4 mg total) by mouth daily after supper.  Modified Medications   No medications on file  Discontinued Medications     SIGNIFICANT DIAGNOSTIC EXAMS  10-07-15: 2-d echo: Left ventricle: The cavity size was normal. Wall thickness was increased in a pattern of severe LVH. Systolic function was normal. The estimated ejection fraction was in the range of 55% to 60%. Left ventricular diastolic function parameters were normal. - Aortic valve: There was mild regurgitation. - Mitral valve: There was mild regurgitation. - Left atrium: The atrium was moderately dilated.  10-11-15: mri of brain: 1. Evolving, small acute infarcts throughout the right cerebral hemisphere with some areas of new infarction, likely watershed ischemia. 2. Moderate chronic small vessel ischemic disease.  11-05-15:  chest x-ray: No active cardiopulmonary disease.    LABS REVIEWED:   10-07-15:hgb a1c 6.2; chol 262; ldl 206; trig 118; hdl 32 11-05-15: wbc 8.3; hgb 9.3; hct 30.7; mcv 66.1 ;plt 219; glucose 114; bun 19; creat 1.45; k+ 3.9; na++138; liver normal albumin 3.2; urine culture: e-coli; blood culture: no growth 11-06-15: wbc 8.2; hgb 8.1; hct 26.3; mcv 66.1; plt 197; glucose 103; bun 14; creat 1.21; k+ 4.0; na++141; liver normal albumin 2.4      Review of Systems  Constitutional: Negative for malaise/fatigue.  Respiratory: Negative for cough and shortness of breath.   Cardiovascular: Negative for chest pain, palpitations and leg swelling.  Gastrointestinal: Negative for heartburn, abdominal pain and constipation.  Musculoskeletal: Negative for myalgias, back pain and joint pain.  Skin: Negative.   Neurological: Negative for dizziness.  Psychiatric/Behavioral: The patient is not nervous/anxious.       Physical Exam  Constitutional: He is oriented to person, place, and time. No distress.  Eyes: Conjunctivae are normal.  Neck: Neck supple. No JVD present. No thyromegaly present.  Cardiovascular: Normal rate, regular rhythm and intact distal pulses.   Respiratory: Effort normal and breath sounds normal. No respiratory distress. He has no wheezes.  GI: Soft. Bowel sounds are normal. He exhibits no distension. There is no tenderness.  Musculoskeletal: He exhibits no edema.  Able to move all extremities  Has left lower extremity weakness   Lymphadenopathy:    He has no cervical adenopathy.  Neurological: He is alert and oriented to person, place, and time.  Skin: Skin is warm and dry. He is not diaphoretic.  Psychiatric: He has a normal mood and affect.      ASSESSMENT/ PLAN:   Will discharge to home with home health for pt/ot to evaluate and treat as indicated for gait; balance; strength; adl training. He will not need dme; he has all necessary equipment. His prescriptions have been  written for a 30 day supply of his medications with #30 oxycodone 5 mg tabs. The facility is to setup his medical follow up for 1-2 weeks post discharge.    Time spent with patient   40  minutes >50% time spent counseling; reviewing medical record; tests; labs; and developing future plan of care           Ok Edwards NP [facility]  Contact [phone] Monday through Friday 8am- 5pm  After hours call [phone]

## 2015-11-28 ENCOUNTER — Telehealth: Payer: Self-pay | Admitting: Internal Medicine

## 2015-11-28 DIAGNOSIS — N183 Chronic kidney disease, stage 3 (moderate): Secondary | ICD-10-CM | POA: Diagnosis not present

## 2015-11-28 DIAGNOSIS — I69398 Other sequelae of cerebral infarction: Secondary | ICD-10-CM | POA: Diagnosis not present

## 2015-11-28 DIAGNOSIS — D509 Iron deficiency anemia, unspecified: Secondary | ICD-10-CM | POA: Diagnosis not present

## 2015-11-28 DIAGNOSIS — M6281 Muscle weakness (generalized): Secondary | ICD-10-CM | POA: Diagnosis not present

## 2015-11-28 DIAGNOSIS — I69354 Hemiplegia and hemiparesis following cerebral infarction affecting left non-dominant side: Secondary | ICD-10-CM | POA: Diagnosis not present

## 2015-11-28 DIAGNOSIS — Z87440 Personal history of urinary (tract) infections: Secondary | ICD-10-CM | POA: Diagnosis not present

## 2015-11-28 DIAGNOSIS — Z7982 Long term (current) use of aspirin: Secondary | ICD-10-CM | POA: Diagnosis not present

## 2015-11-28 DIAGNOSIS — R2689 Other abnormalities of gait and mobility: Secondary | ICD-10-CM | POA: Diagnosis not present

## 2015-11-28 DIAGNOSIS — I480 Paroxysmal atrial fibrillation: Secondary | ICD-10-CM | POA: Diagnosis not present

## 2015-11-28 DIAGNOSIS — I2510 Atherosclerotic heart disease of native coronary artery without angina pectoris: Secondary | ICD-10-CM | POA: Diagnosis not present

## 2015-11-28 DIAGNOSIS — I129 Hypertensive chronic kidney disease with stage 1 through stage 4 chronic kidney disease, or unspecified chronic kidney disease: Secondary | ICD-10-CM | POA: Diagnosis not present

## 2015-11-28 NOTE — Telephone Encounter (Signed)
Please advise 

## 2015-11-28 NOTE — Telephone Encounter (Signed)
Ok for verbal 

## 2015-11-28 NOTE — Telephone Encounter (Signed)
Monique called from [facility] home health request verbal for skill nursing for medication management, also for PT 2 times a week for 4 weeks for gait balance and strength training. Please call her back

## 2015-11-29 ENCOUNTER — Telehealth: Payer: Self-pay | Admitting: Internal Medicine

## 2015-11-29 ENCOUNTER — Ambulatory Visit: Payer: Medicare Other | Admitting: Internal Medicine

## 2015-11-29 DIAGNOSIS — Z0289 Encounter for other administrative examinations: Secondary | ICD-10-CM

## 2015-11-29 NOTE — Telephone Encounter (Signed)
Verbal given 

## 2015-11-29 NOTE — Telephone Encounter (Signed)
Requesting verbal order to move OT evaluation to next week.  Tried to reach patient today for OT visit and was not able to reach patient.

## 2015-12-02 NOTE — Telephone Encounter (Signed)
Monique called back and I let her know Dr. [name] gave the verbal ok

## 2015-12-03 ENCOUNTER — Telehealth: Payer: Self-pay | Admitting: Internal Medicine

## 2015-12-03 DIAGNOSIS — I2510 Atherosclerotic heart disease of native coronary artery without angina pectoris: Secondary | ICD-10-CM | POA: Diagnosis not present

## 2015-12-03 DIAGNOSIS — D509 Iron deficiency anemia, unspecified: Secondary | ICD-10-CM | POA: Diagnosis not present

## 2015-12-03 DIAGNOSIS — R2689 Other abnormalities of gait and mobility: Secondary | ICD-10-CM | POA: Diagnosis not present

## 2015-12-03 DIAGNOSIS — Z7982 Long term (current) use of aspirin: Secondary | ICD-10-CM | POA: Diagnosis not present

## 2015-12-03 DIAGNOSIS — N183 Chronic kidney disease, stage 3 (moderate): Secondary | ICD-10-CM | POA: Diagnosis not present

## 2015-12-03 DIAGNOSIS — Z87440 Personal history of urinary (tract) infections: Secondary | ICD-10-CM | POA: Diagnosis not present

## 2015-12-03 DIAGNOSIS — M6281 Muscle weakness (generalized): Secondary | ICD-10-CM | POA: Diagnosis not present

## 2015-12-03 DIAGNOSIS — I480 Paroxysmal atrial fibrillation: Secondary | ICD-10-CM | POA: Diagnosis not present

## 2015-12-03 DIAGNOSIS — I129 Hypertensive chronic kidney disease with stage 1 through stage 4 chronic kidney disease, or unspecified chronic kidney disease: Secondary | ICD-10-CM | POA: Diagnosis not present

## 2015-12-03 DIAGNOSIS — I69354 Hemiplegia and hemiparesis following cerebral infarction affecting left non-dominant side: Secondary | ICD-10-CM | POA: Diagnosis not present

## 2015-12-03 DIAGNOSIS — I69398 Other sequelae of cerebral infarction: Secondary | ICD-10-CM | POA: Diagnosis not present

## 2015-12-03 NOTE — Telephone Encounter (Signed)
Please advise 

## 2015-12-03 NOTE — Telephone Encounter (Signed)
Ok or verbal

## 2015-12-03 NOTE — Telephone Encounter (Signed)
Alroy Dust called request order to discharge Mr. Gerhold, she said after his OT eval, pt does not need OT anymore. Please call her back

## 2015-12-04 NOTE — Telephone Encounter (Signed)
Verbal given 

## 2015-12-05 ENCOUNTER — Telehealth: Payer: Self-pay | Admitting: Internal Medicine

## 2015-12-05 DIAGNOSIS — M6281 Muscle weakness (generalized): Secondary | ICD-10-CM | POA: Diagnosis not present

## 2015-12-05 DIAGNOSIS — D509 Iron deficiency anemia, unspecified: Secondary | ICD-10-CM | POA: Diagnosis not present

## 2015-12-05 DIAGNOSIS — Z87440 Personal history of urinary (tract) infections: Secondary | ICD-10-CM | POA: Diagnosis not present

## 2015-12-05 DIAGNOSIS — I69354 Hemiplegia and hemiparesis following cerebral infarction affecting left non-dominant side: Secondary | ICD-10-CM | POA: Diagnosis not present

## 2015-12-05 DIAGNOSIS — R2689 Other abnormalities of gait and mobility: Secondary | ICD-10-CM | POA: Diagnosis not present

## 2015-12-05 DIAGNOSIS — Z7982 Long term (current) use of aspirin: Secondary | ICD-10-CM | POA: Diagnosis not present

## 2015-12-05 DIAGNOSIS — I2510 Atherosclerotic heart disease of native coronary artery without angina pectoris: Secondary | ICD-10-CM | POA: Diagnosis not present

## 2015-12-05 DIAGNOSIS — I69398 Other sequelae of cerebral infarction: Secondary | ICD-10-CM | POA: Diagnosis not present

## 2015-12-05 DIAGNOSIS — I480 Paroxysmal atrial fibrillation: Secondary | ICD-10-CM | POA: Diagnosis not present

## 2015-12-05 DIAGNOSIS — I129 Hypertensive chronic kidney disease with stage 1 through stage 4 chronic kidney disease, or unspecified chronic kidney disease: Secondary | ICD-10-CM | POA: Diagnosis not present

## 2015-12-05 DIAGNOSIS — N183 Chronic kidney disease, stage 3 (moderate): Secondary | ICD-10-CM | POA: Diagnosis not present

## 2015-12-05 NOTE — Telephone Encounter (Signed)
Called and spoke to [place]. Patient is no longer taking eliquis due to insurance, also not able to afford his zetia. A social worker is being sent out to patients home along with nursing twice a week for four weeks. Patient was also seen in the hospital then placed into a nursing facility. Patient has been discharged from nursing home.

## 2015-12-05 NOTE — Telephone Encounter (Signed)
[place] [phone]  She is is needing a bunch of verbal order and ands to review from meds with you.  Can you call her when you get a chance

## 2015-12-06 DIAGNOSIS — D509 Iron deficiency anemia, unspecified: Secondary | ICD-10-CM | POA: Diagnosis not present

## 2015-12-06 DIAGNOSIS — I2510 Atherosclerotic heart disease of native coronary artery without angina pectoris: Secondary | ICD-10-CM | POA: Diagnosis not present

## 2015-12-06 DIAGNOSIS — I69354 Hemiplegia and hemiparesis following cerebral infarction affecting left non-dominant side: Secondary | ICD-10-CM | POA: Diagnosis not present

## 2015-12-06 DIAGNOSIS — I69398 Other sequelae of cerebral infarction: Secondary | ICD-10-CM | POA: Diagnosis not present

## 2015-12-06 DIAGNOSIS — I480 Paroxysmal atrial fibrillation: Secondary | ICD-10-CM | POA: Diagnosis not present

## 2015-12-06 DIAGNOSIS — I129 Hypertensive chronic kidney disease with stage 1 through stage 4 chronic kidney disease, or unspecified chronic kidney disease: Secondary | ICD-10-CM | POA: Diagnosis not present

## 2015-12-06 DIAGNOSIS — M6281 Muscle weakness (generalized): Secondary | ICD-10-CM | POA: Diagnosis not present

## 2015-12-06 DIAGNOSIS — Z87440 Personal history of urinary (tract) infections: Secondary | ICD-10-CM | POA: Diagnosis not present

## 2015-12-06 DIAGNOSIS — N183 Chronic kidney disease, stage 3 (moderate): Secondary | ICD-10-CM | POA: Diagnosis not present

## 2015-12-06 DIAGNOSIS — Z7982 Long term (current) use of aspirin: Secondary | ICD-10-CM | POA: Diagnosis not present

## 2015-12-06 DIAGNOSIS — R2689 Other abnormalities of gait and mobility: Secondary | ICD-10-CM | POA: Diagnosis not present

## 2015-12-09 ENCOUNTER — Telehealth: Payer: Self-pay

## 2015-12-09 DIAGNOSIS — Z87440 Personal history of urinary (tract) infections: Secondary | ICD-10-CM | POA: Diagnosis not present

## 2015-12-09 DIAGNOSIS — I2510 Atherosclerotic heart disease of native coronary artery without angina pectoris: Secondary | ICD-10-CM | POA: Diagnosis not present

## 2015-12-09 DIAGNOSIS — I69398 Other sequelae of cerebral infarction: Secondary | ICD-10-CM | POA: Diagnosis not present

## 2015-12-09 DIAGNOSIS — N183 Chronic kidney disease, stage 3 (moderate): Secondary | ICD-10-CM | POA: Diagnosis not present

## 2015-12-09 DIAGNOSIS — M6281 Muscle weakness (generalized): Secondary | ICD-10-CM | POA: Diagnosis not present

## 2015-12-09 DIAGNOSIS — D509 Iron deficiency anemia, unspecified: Secondary | ICD-10-CM | POA: Diagnosis not present

## 2015-12-09 DIAGNOSIS — I129 Hypertensive chronic kidney disease with stage 1 through stage 4 chronic kidney disease, or unspecified chronic kidney disease: Secondary | ICD-10-CM | POA: Diagnosis not present

## 2015-12-09 DIAGNOSIS — R2689 Other abnormalities of gait and mobility: Secondary | ICD-10-CM | POA: Diagnosis not present

## 2015-12-09 DIAGNOSIS — I480 Paroxysmal atrial fibrillation: Secondary | ICD-10-CM | POA: Diagnosis not present

## 2015-12-09 DIAGNOSIS — I69354 Hemiplegia and hemiparesis following cerebral infarction affecting left non-dominant side: Secondary | ICD-10-CM | POA: Diagnosis not present

## 2015-12-09 DIAGNOSIS — Z7982 Long term (current) use of aspirin: Secondary | ICD-10-CM | POA: Diagnosis not present

## 2015-12-09 NOTE — Telephone Encounter (Signed)
Called Monique no answer LMOM w/MD response../lmb 

## 2015-12-09 NOTE — Telephone Encounter (Signed)
Patient has appointment scheduled for 5/9.

## 2015-12-09 NOTE — Telephone Encounter (Signed)
Please re=check every 8 hrs for 2 days and call with results

## 2015-12-09 NOTE — Telephone Encounter (Signed)
P.T called to state that patient is having high bps of 200/90 that just wanted you to be aware.

## 2015-12-09 NOTE — Telephone Encounter (Signed)
[name] has called back.  States she needs frequency and MSW to see patient.  Patient can not be seen until she has these verbal orders.  Please call back as soon as possible at [phone].

## 2015-12-10 ENCOUNTER — Telehealth: Payer: Self-pay

## 2015-12-10 ENCOUNTER — Encounter: Payer: Self-pay | Admitting: Internal Medicine

## 2015-12-10 ENCOUNTER — Other Ambulatory Visit (INDEPENDENT_AMBULATORY_CARE_PROVIDER_SITE_OTHER): Payer: Medicare Other

## 2015-12-10 ENCOUNTER — Other Ambulatory Visit: Payer: Self-pay | Admitting: Internal Medicine

## 2015-12-10 ENCOUNTER — Ambulatory Visit (INDEPENDENT_AMBULATORY_CARE_PROVIDER_SITE_OTHER): Payer: Medicare Other | Admitting: Internal Medicine

## 2015-12-10 VITALS — BP 138/78 | HR 73 | Temp 98.1°F | Resp 20 | Wt 176.0 lb

## 2015-12-10 DIAGNOSIS — I639 Cerebral infarction, unspecified: Secondary | ICD-10-CM

## 2015-12-10 DIAGNOSIS — R030 Elevated blood-pressure reading, without diagnosis of hypertension: Secondary | ICD-10-CM | POA: Diagnosis not present

## 2015-12-10 DIAGNOSIS — R6889 Other general symptoms and signs: Secondary | ICD-10-CM | POA: Diagnosis not present

## 2015-12-10 DIAGNOSIS — Z7901 Long term (current) use of anticoagulants: Secondary | ICD-10-CM

## 2015-12-10 DIAGNOSIS — I480 Paroxysmal atrial fibrillation: Secondary | ICD-10-CM

## 2015-12-10 DIAGNOSIS — Z0001 Encounter for general adult medical examination with abnormal findings: Secondary | ICD-10-CM

## 2015-12-10 DIAGNOSIS — R0989 Other specified symptoms and signs involving the circulatory and respiratory systems: Secondary | ICD-10-CM

## 2015-12-10 LAB — HEPATIC FUNCTION PANEL
ALT: 16 U/L (ref 0–53)
AST: 19 U/L (ref 0–37)
Albumin: 3.8 g/dL (ref 3.5–5.2)
Alkaline Phosphatase: 73 U/L (ref 39–117)
BILIRUBIN DIRECT: 0.1 mg/dL (ref 0.0–0.3)
BILIRUBIN TOTAL: 0.3 mg/dL (ref 0.2–1.2)
Total Protein: 7.5 g/dL (ref 6.0–8.3)

## 2015-12-10 LAB — BASIC METABOLIC PANEL
BUN: 15 mg/dL (ref 6–23)
CALCIUM: 8.7 mg/dL (ref 8.4–10.5)
CO2: 27 mEq/L (ref 19–32)
CREATININE: 0.93 mg/dL (ref 0.40–1.50)
Chloride: 106 mEq/L (ref 96–112)
GFR: 102.17 mL/min (ref >60.00)
Glucose, Bld: 94 mg/dL (ref 70–99)
Potassium: 4 mEq/L (ref 3.5–5.1)
Sodium: 143 mEq/L (ref 135–145)

## 2015-12-10 LAB — URINALYSIS, ROUTINE W REFLEX MICROSCOPIC
BILIRUBIN URINE: NEGATIVE
Ketones, ur: NEGATIVE
Nitrite: NEGATIVE
Specific Gravity, Urine: 1.015 (ref 1.000–1.030)
TOTAL PROTEIN, URINE-UPE24: NEGATIVE
Urine Glucose: NEGATIVE
Urobilinogen, UA: 0.2 (ref 0.0–1.0)
pH: 6 (ref 5.0–8.0)

## 2015-12-10 LAB — CBC WITH DIFFERENTIAL/PLATELET
BASOS PCT: 0.2 % (ref 0.0–3.0)
Basophils Absolute: 0 10*3/uL (ref 0.0–0.1)
EOS PCT: 2.9 % (ref 0.0–5.0)
Eosinophils Absolute: 0.2 10*3/uL (ref 0.0–0.7)
HEMATOCRIT: 31.5 % — AB (ref 39.0–52.0)
Hemoglobin: 9.7 g/dL — ABNORMAL LOW (ref 13.0–17.0)
LYMPHS PCT: 27.4 % (ref 12.0–46.0)
Lymphs Abs: 1.5 10*3/uL (ref 0.7–4.0)
MCHC: 30.9 g/dL (ref 30.0–36.0)
MONOS PCT: 8.9 % (ref 3.0–12.0)
Monocytes Absolute: 0.5 10*3/uL (ref 0.1–1.0)
NEUTROS ABS: 3.4 10*3/uL (ref 1.4–7.7)
Neutrophils Relative %: 60.6 % (ref 43.0–77.0)
PLATELETS: 218 10*3/uL (ref 150.0–400.0)
RBC: 4.76 Mil/uL (ref 4.22–5.81)
RDW: 18.1 % — AB (ref 11.5–15.5)
WBC: 5.6 10*3/uL (ref 4.0–10.5)

## 2015-12-10 LAB — LIPID PANEL
CHOL/HDL RATIO: 2
Cholesterol: 121 mg/dL (ref 0–200)
HDL: 48.8 mg/dL (ref >39.00)
LDL Cholesterol: 64 mg/dL (ref 0–99)
NONHDL: 72.52
Triglycerides: 42 mg/dL (ref 0.0–149.0)
VLDL: 8.4 mg/dL (ref 0.0–40.0)

## 2015-12-10 LAB — PSA: PSA: 1.36 ng/mL (ref 0.10–4.00)

## 2015-12-10 LAB — TSH: TSH: 1.59 u[IU]/mL (ref 0.35–4.50)

## 2015-12-10 MED ORDER — CIPROFLOXACIN HCL 500 MG PO TABS: 500.0000 mg | ORAL_TABLET | Freq: Two times a day (BID) | ORAL | Status: DC

## 2015-12-10 MED ORDER — APIXABAN 5 MG PO TABS: 5.0000 mg | ORAL_TABLET | Freq: Two times a day (BID) | ORAL | Status: DC

## 2015-12-10 NOTE — Progress Notes (Signed)
Subjective:    Patient ID: [name], male    DOB: 03/10/1941, 75 y.o.   MRN: [id number]  HPI  Here for wellness and f/u;  Overall doing ok;  Pt denies Chest pain, worsening SOB, DOE, wheezing, orthopnea, PND, worsening LE edema, palpitations, dizziness or syncope.  Pt denies neurological change such as new headache, facial or extremity weakness.  Pt denies polydipsia, polyuria, or low sugar symptoms. Pt states overall good compliance with treatment and medications, good tolerability, and has been trying to follow appropriate diet.  Pt denies worsening depressive symptoms, suicidal ideation or panic. No fever, night sweats, wt loss, loss of appetite, or other constitutional symptoms.  Pt states fair ability with ADL's, has low fall risk, home safety reviewed and adequate, no other significant changes in hearing or vision, and only occasionally active with exercise. Just d/c apr 4 with sepsis/UTI, then to rehab until apr 26, with successful d/c then to home with PT.  Just after home PT noted sbp about 200. Has hx of labile HTN, improved today Pt has questions about his meds, not clear he is taking them correctly and he is confused today, has some cognitive difficulty s/p right brain CVA and left hemiplegia, now s/p right CEA   Lives by himself.  ? Not taking the eliquis, by notes due to cost, but pt states he is not sure about this today. Declines tetanus update Past Medical History  Diagnosis Date  . Hypertension     echocardiogram 6/10: Moderate LVH, EF 55-65%, mild, mild MR, MAC, mild LAE, PASP 32  . Dyslipidemia   . History of stroke   . CAD (coronary artery disease)     cath in 1/08: EF 60%, mild dilated Ao root, oD2 30%, LAD 20-30%.  . Paroxysmal atrial fibrillation ([place])   . Anemia, iron deficiency 08/15/2011  . History of pneumonia 08/15/2011  . History of MI (myocardial infarction) 08/15/2011  . Dilated aortic root ([place]) 08/15/2011  . Stroke ([place]) 10/2011  . Myocardial infarction ([place])    . Hyperlipidemia   . Shortness of breath dyspnea   . Prediabetes 10/08/2015  . Preoperative cardiovascular examination 10/09/2015  . Dysrhythmia    Past Surgical History  Procedure Laterality Date  . Cardiac catheterization    . Endarterectomy Right 10/10/2015    Procedure: ENDARTERECTOMY CAROTID-RIGHT;  Surgeon: Serafina Mitchell, MD;  Location: [facility];  Service: Vascular;  Laterality: Right;    reports that he has never smoked. He has never used smokeless tobacco. He reports that he does not drink alcohol or use illicit drugs. family history includes Aneurysm in his sister. There is no history of Colon cancer. No Known Allergies Current Outpatient Prescriptions on File Prior to Visit  Medication Sig Dispense Refill  . albuterol (PROVENTIL HFA;VENTOLIN HFA) 108 (90 BASE) MCG/ACT inhaler Inhale 2 puffs into the lungs every 6 (six) hours as needed for wheezing or shortness of breath. 1 Inhaler 2  . amLODipine (NORVASC) 10 MG tablet Take 1 tablet (10 mg total) by mouth daily. 30 tablet 0  . aspirin EC 81 MG tablet Take 1 tablet (81 mg total) by mouth daily. 30 tablet 0  . atorvastatin (LIPITOR) 80 MG tablet Take 1 tablet (80 mg total) by mouth daily. 90 tablet 3  . carvedilol (COREG) 3.125 MG tablet Take 3.125 mg by mouth 2 (two) times daily.    Marland Kitchen ezetimibe (ZETIA) 10 MG tablet Take 1 tablet (10 mg total) by mouth daily. 30 tablet 0  .  furosemide (LASIX) 20 MG tablet Take 1 tablet (20 mg total) by mouth daily. 30 tablet 1  . isosorbide mononitrate (IMDUR) 30 MG 24 hr tablet Take 1 tablet (30 mg total) by mouth daily. 30 tablet 0  . oxyCODONE (OXY IR/ROXICODONE) 5 MG immediate release tablet Take 1-2 tablets (5-10 mg total) by mouth every 6 (six) hours as needed for moderate pain. 30 tablet 0  . pantoprazole (PROTONIX) 40 MG tablet Take 1 tablet (40 mg total) by mouth daily. 30 tablet 0  . senna-docusate (SENOKOT-S) 8.6-50 MG tablet Take 1 tablet by mouth at bedtime as needed for mild constipation.  15 tablet 0  . tamsulosin (FLOMAX) 0.4 MG CAPS capsule Take 1 capsule (0.4 mg total) by mouth daily after supper. 30 capsule 0   No current facility-administered medications on file prior to visit.   Review of Systems Constitutional: Negative for increased diaphoresis, or other activity, appetite or siginficant weight change other than noted HENT: Negative for worsening hearing loss, ear pain, facial swelling, mouth sores and neck stiffness.   Eyes: Negative for other worsening pain, redness or visual disturbance.  Respiratory: Negative for choking or stridor Cardiovascular: Negative for other chest pain and palpitations.  Gastrointestinal: Negative for worsening diarrhea, blood in stool, or abdominal distention Genitourinary: Negative for hematuria, flank pain or change in urine volume.  Musculoskeletal: Negative for myalgias or other joint complaints.  Skin: Negative for other color change and wound or drainage.  Neurological: Negative for syncope and numbness. other than noted Hematological: Negative for adenopathy. or other swelling Psychiatric/Behavioral: Negative for hallucinations, SI, self-injury, decreased concentration or other worsening agitation.      Objective:   Physical Exam BP 138/78 mmHg  Pulse 73  Temp(Src) 98.1 F (36.7 C) (Oral)  Resp 20  Wt 176 lb (79.833 kg)  SpO2 97% VS noted,  Constitutional: Pt is oriented to person, place, and time. Appears well-developed and well-nourished, in no significant distress Head: Normocephalic and atraumatic  Eyes: Conjunctivae and EOM are normal. Pupils are equal, round, and reactive to light Right Ear: External ear normal.  Left Ear: External ear normal Nose: Nose normal.  Mouth/Throat: Oropharynx is clear and moist  Neck: Normal range of motion. Neck supple. No JVD present. No tracheal deviation present or significant neck LA or mass Cardiovascular: Normal rate, regular rhythm, normal heart sounds and intact distal pulses.    Pulmonary/Chest: Effort normal and breath sounds without rales or wheezing  Abdominal: Soft. Bowel sounds are normal. NT. No HSM  Musculoskeletal: Normal range of motion. Exhibits no edema Lymphadenopathy: Has no cervical adenopathy.  Neurological: Pt is alert and oriented to person, place, and time. Pt has normal reflexes. No cranial nerve deficit. Motor with 4+/5 LUE/LLE weakness (slight worse to the upper), + confused about meds , has some cognitive difficulty Skin: Skin is warm and dry. No rash noted or new ulcers Psychiatric:  Has normal mood and affect. Behavior is normal.     Assessment & Plan:

## 2015-12-10 NOTE — Assessment & Plan Note (Addendum)
Improved today,  to f/u any worsening symptoms or concerns  In addition to the time spent performing CPE, I spent an additional 40 minutes face to face,in which greater than 50% of this time was spent in counseling and coordination of care for patient's acute illness as documented.

## 2015-12-10 NOTE — Telephone Encounter (Signed)
PA initiated via CoverMyMeds key [id number]

## 2015-12-10 NOTE — Assessment & Plan Note (Signed)
Rate and volume stable, cont same tx 

## 2015-12-10 NOTE — Assessment & Plan Note (Signed)
Overall doing well, age appropriate education and counseling updated, referrals for preventative services and immunizations addressed, dietary and smoking counseling addressed, most recent labs reviewed.  I have personally reviewed and have noted:  1) the patient's medical and social history 2) The pt's use of alcohol, tobacco, and illicit drugs 3) The patient's current medications and supplements 4) Functional ability including ADL's, fall risk, home safety risk, hearing and visual impairment 5) Diet and physical activities 6) Evidence for depression or mood disorder 7) The patient's height, weight, and BMI have been recorded in the chart  I have made referrals, and provided counseling and education based on review of the above Lab Results  Component Value Date   WBC 8.2 11/06/2015   HGB 8.1* 11/06/2015   HCT 26.3* 11/06/2015   PLT 197 11/06/2015   GLUCOSE 103* 11/06/2015   CHOL 262* 10/07/2015   TRIG 118 10/07/2015   HDL 32* 10/07/2015   LDLDIRECT 230.0 01/20/2013   LDLCALC 206* 10/07/2015   ALT 9* 11/06/2015   AST 15 11/06/2015   NA 141 11/06/2015   K 4.0 11/06/2015   CL 105 11/06/2015   CREATININE 1.21 11/06/2015   BUN 14 11/06/2015   CO2 25 11/06/2015   TSH 0.97 06/27/2014   PSA 0.94 06/27/2014   INR 1.23 11/05/2015   HGBA1C 6.2* 10/07/2015   For f/u labs today

## 2015-12-10 NOTE — Assessment & Plan Note (Signed)
For paf, refill done to pharmacy, pt educated and encouraged to take, to cont as prescribed

## 2015-12-10 NOTE — Assessment & Plan Note (Signed)
With cognitive and left sided defecits, not clear if pt can successfully remain living by himself, will ask [facility] to see as well, cont statin,asa

## 2015-12-10 NOTE — Progress Notes (Signed)
Pre visit review using our clinic review tool, if applicable. No additional management support is needed unless otherwise documented below in the visit note. 

## 2015-12-10 NOTE — Patient Instructions (Signed)
Your medications were placed in your pill box today; please take them every day.  We have also sent Eliquis blood thinner to your pharmacy in case you need the refill  You will be contacted regarding the referral for: [facility] (a group in town that helps try to keep you from getting sick and back into the hospital)  Please continue all other medications as before, and refills have been done if requested.  Please have the pharmacy call with any other refills you may need.  Please continue your efforts at being more active, low cholesterol diet, and weight control.  You are otherwise up to date with prevention measures today.  Please keep your appointments with your specialists as you may have planned  Please go to the LAB in the Basement (turn left off the elevator) for the tests to be done today  You will be contacted by phone if any changes need to be made immediately.  Otherwise, you will receive a letter about your results with an explanation, but please check with MyChart first.  Please remember to sign up for MyChart if you have not done so, as this will be important to you in the future with finding out test results, communicating by private email, and scheduling acute appointments online when needed.  Please return in 3 months, or sooner if needed

## 2015-12-11 ENCOUNTER — Ambulatory Visit: Payer: Medicare Other | Admitting: Neurology

## 2015-12-11 ENCOUNTER — Telehealth: Payer: Self-pay

## 2015-12-11 DIAGNOSIS — I2510 Atherosclerotic heart disease of native coronary artery without angina pectoris: Secondary | ICD-10-CM | POA: Diagnosis not present

## 2015-12-11 DIAGNOSIS — D509 Iron deficiency anemia, unspecified: Secondary | ICD-10-CM | POA: Diagnosis not present

## 2015-12-11 DIAGNOSIS — Z87440 Personal history of urinary (tract) infections: Secondary | ICD-10-CM | POA: Diagnosis not present

## 2015-12-11 DIAGNOSIS — I69398 Other sequelae of cerebral infarction: Secondary | ICD-10-CM | POA: Diagnosis not present

## 2015-12-11 DIAGNOSIS — N183 Chronic kidney disease, stage 3 (moderate): Secondary | ICD-10-CM | POA: Diagnosis not present

## 2015-12-11 DIAGNOSIS — I129 Hypertensive chronic kidney disease with stage 1 through stage 4 chronic kidney disease, or unspecified chronic kidney disease: Secondary | ICD-10-CM | POA: Diagnosis not present

## 2015-12-11 DIAGNOSIS — I69354 Hemiplegia and hemiparesis following cerebral infarction affecting left non-dominant side: Secondary | ICD-10-CM | POA: Diagnosis not present

## 2015-12-11 DIAGNOSIS — M6281 Muscle weakness (generalized): Secondary | ICD-10-CM | POA: Diagnosis not present

## 2015-12-11 DIAGNOSIS — I480 Paroxysmal atrial fibrillation: Secondary | ICD-10-CM | POA: Diagnosis not present

## 2015-12-11 DIAGNOSIS — R2689 Other abnormalities of gait and mobility: Secondary | ICD-10-CM | POA: Diagnosis not present

## 2015-12-11 DIAGNOSIS — Z7982 Long term (current) use of aspirin: Secondary | ICD-10-CM | POA: Diagnosis not present

## 2015-12-11 NOTE — Telephone Encounter (Signed)
Monique with [facility] called with pt BP reading: 160/80 sitting (at rest).

## 2015-12-11 NOTE — Telephone Encounter (Signed)
APPROVED through 08/02/2016 

## 2015-12-12 ENCOUNTER — Telehealth: Payer: Self-pay | Admitting: Internal Medicine

## 2015-12-12 ENCOUNTER — Other Ambulatory Visit: Payer: Self-pay

## 2015-12-12 DIAGNOSIS — M6281 Muscle weakness (generalized): Secondary | ICD-10-CM | POA: Diagnosis not present

## 2015-12-12 DIAGNOSIS — I69398 Other sequelae of cerebral infarction: Secondary | ICD-10-CM | POA: Diagnosis not present

## 2015-12-12 DIAGNOSIS — Z87440 Personal history of urinary (tract) infections: Secondary | ICD-10-CM | POA: Diagnosis not present

## 2015-12-12 DIAGNOSIS — R2689 Other abnormalities of gait and mobility: Secondary | ICD-10-CM | POA: Diagnosis not present

## 2015-12-12 DIAGNOSIS — Z7982 Long term (current) use of aspirin: Secondary | ICD-10-CM | POA: Diagnosis not present

## 2015-12-12 DIAGNOSIS — I69354 Hemiplegia and hemiparesis following cerebral infarction affecting left non-dominant side: Secondary | ICD-10-CM | POA: Diagnosis not present

## 2015-12-12 DIAGNOSIS — I480 Paroxysmal atrial fibrillation: Secondary | ICD-10-CM | POA: Diagnosis not present

## 2015-12-12 DIAGNOSIS — I129 Hypertensive chronic kidney disease with stage 1 through stage 4 chronic kidney disease, or unspecified chronic kidney disease: Secondary | ICD-10-CM | POA: Diagnosis not present

## 2015-12-12 DIAGNOSIS — N183 Chronic kidney disease, stage 3 (moderate): Secondary | ICD-10-CM | POA: Diagnosis not present

## 2015-12-12 DIAGNOSIS — D509 Iron deficiency anemia, unspecified: Secondary | ICD-10-CM | POA: Diagnosis not present

## 2015-12-12 DIAGNOSIS — I2510 Atherosclerotic heart disease of native coronary artery without angina pectoris: Secondary | ICD-10-CM | POA: Diagnosis not present

## 2015-12-12 NOTE — Telephone Encounter (Signed)
Patient still does not have eliquis or zetia in the home due to financial reasons patient is unable to pick up.  Between the two copays this is over 100.00.  Would like to know if there are samples.

## 2015-12-12 NOTE — Patient Outreach (Signed)
[facility]) Care Management  12/12/2015  [name] 24-Jun-1941 [id number]   Telephone Screen  Referral Date: 12/11/15 Referral Source: MD office Referral Reason: "not taking meds well, lives alone, diagnosis of stroke"   Outreach attempt #1 to patient. Spoke with patient. He states he does not have a lot of time to talk right now. He requested a call back possibly tomorrow morning to see if he was available then.    Plan: RN CM will make outreach call to patient within a week.   [name], RN,BSN,CCM [place] Management Telephonic Care Management Coordinator Direct Phone: [phone] Toll Free: [phone] Fax: [phone]

## 2015-12-12 NOTE — Telephone Encounter (Signed)
Unable to give samples due to not having any. Left message for patient to give [place] a call back.

## 2015-12-13 ENCOUNTER — Other Ambulatory Visit: Payer: Self-pay

## 2015-12-13 NOTE — Patient Outreach (Signed)
[facility]) Care Management  12/13/2015  [name] 06/27/1941 [id number]   Telephone Screen  Referral Date: 12/11/15 Referral Source: MD office Referral Reason: "not taking meds well, lives alone, diagnosis of stroke"    Outreach attempt #2 to patient. Male answered. When asked to speak with patient no response and then call disconnected.    Plan: RN CM will make outreach attempt to patient within a week.   [name], RN,BSN,CCM [place] Management Telephonic Care Management Coordinator Direct Phone: [phone] Toll Free: [phone] Fax: [phone]

## 2015-12-16 DIAGNOSIS — I69354 Hemiplegia and hemiparesis following cerebral infarction affecting left non-dominant side: Secondary | ICD-10-CM | POA: Diagnosis not present

## 2015-12-16 DIAGNOSIS — I2510 Atherosclerotic heart disease of native coronary artery without angina pectoris: Secondary | ICD-10-CM | POA: Diagnosis not present

## 2015-12-16 DIAGNOSIS — Z87440 Personal history of urinary (tract) infections: Secondary | ICD-10-CM | POA: Diagnosis not present

## 2015-12-16 DIAGNOSIS — N183 Chronic kidney disease, stage 3 (moderate): Secondary | ICD-10-CM | POA: Diagnosis not present

## 2015-12-16 DIAGNOSIS — D509 Iron deficiency anemia, unspecified: Secondary | ICD-10-CM | POA: Diagnosis not present

## 2015-12-16 DIAGNOSIS — I69398 Other sequelae of cerebral infarction: Secondary | ICD-10-CM | POA: Diagnosis not present

## 2015-12-16 DIAGNOSIS — I129 Hypertensive chronic kidney disease with stage 1 through stage 4 chronic kidney disease, or unspecified chronic kidney disease: Secondary | ICD-10-CM | POA: Diagnosis not present

## 2015-12-16 DIAGNOSIS — I480 Paroxysmal atrial fibrillation: Secondary | ICD-10-CM | POA: Diagnosis not present

## 2015-12-16 DIAGNOSIS — Z7982 Long term (current) use of aspirin: Secondary | ICD-10-CM | POA: Diagnosis not present

## 2015-12-16 DIAGNOSIS — R2689 Other abnormalities of gait and mobility: Secondary | ICD-10-CM | POA: Diagnosis not present

## 2015-12-16 DIAGNOSIS — M6281 Muscle weakness (generalized): Secondary | ICD-10-CM | POA: Diagnosis not present

## 2015-12-17 ENCOUNTER — Other Ambulatory Visit: Payer: Self-pay

## 2015-12-17 ENCOUNTER — Telehealth: Payer: Self-pay

## 2015-12-17 NOTE — Telephone Encounter (Signed)
Patient has declined the referral to [facility]. They just wanted you to be aware.

## 2015-12-17 NOTE — Telephone Encounter (Signed)
Please advise if there is anything else that I should do, patient was having trouble keeping track of when to take medications and which ones to take.

## 2015-12-17 NOTE — Patient Outreach (Signed)
[facility]) Care Management  12/17/2015  BRAN LYU 1941-07-26 [id number]   Telephone Screen  Referral Date: 12/11/15 Referral Source: MD office Referral Reason: "not taking meds well, lives alone, diagnosis of stroke"   Outreach attempt #3 to patient. Patient states he did not have a lot of time to speak as he had company in the home. Discussed MD office referral and [facility] services. Patient reported that he does not need [facility] services. He states that he "has a nurse coming out helping with meds." Asked patient what agency nurse was with and he declined to answer. He voiced again that he was not interested in [facility] services at this time.   Plan; RN CM will notify [facility] administrative assistant of case status. RN CM will contact MD office to provide update on status of referral. RN CM will send MD case closure letter.  [name], RN,BSN,CCM [place] Management Telephonic Care Management Coordinator Direct Phone: [phone] Toll Free: [phone] Fax: [phone]

## 2015-12-17 NOTE — Patient Outreach (Signed)
[place] [facility]) Care Management  12/17/2015  [name] October 29, 1940 [id number]   Telephone Screen  Referral Date: 12/11/15 Referral Source: MD office Referral Reason: "not taking meds well, lives alone, diagnosis of stroke"   Telephone call to MD office at [phone]. Left message with [place] advising that patient had declined [facility] services. Message to be relayed to nurse/MD.     [name], RN,BSN,CCM [name] Management Telephonic Care Management Coordinator Direct Phone: [phone] Toll Free: [phone] Fax: [phone]

## 2015-12-18 DIAGNOSIS — I480 Paroxysmal atrial fibrillation: Secondary | ICD-10-CM | POA: Diagnosis not present

## 2015-12-18 DIAGNOSIS — D509 Iron deficiency anemia, unspecified: Secondary | ICD-10-CM | POA: Diagnosis not present

## 2015-12-18 DIAGNOSIS — Z7982 Long term (current) use of aspirin: Secondary | ICD-10-CM | POA: Diagnosis not present

## 2015-12-18 DIAGNOSIS — N183 Chronic kidney disease, stage 3 (moderate): Secondary | ICD-10-CM | POA: Diagnosis not present

## 2015-12-18 DIAGNOSIS — I129 Hypertensive chronic kidney disease with stage 1 through stage 4 chronic kidney disease, or unspecified chronic kidney disease: Secondary | ICD-10-CM | POA: Diagnosis not present

## 2015-12-18 DIAGNOSIS — Z87440 Personal history of urinary (tract) infections: Secondary | ICD-10-CM | POA: Diagnosis not present

## 2015-12-18 DIAGNOSIS — R2689 Other abnormalities of gait and mobility: Secondary | ICD-10-CM | POA: Diagnosis not present

## 2015-12-18 DIAGNOSIS — M6281 Muscle weakness (generalized): Secondary | ICD-10-CM | POA: Diagnosis not present

## 2015-12-18 DIAGNOSIS — I69354 Hemiplegia and hemiparesis following cerebral infarction affecting left non-dominant side: Secondary | ICD-10-CM | POA: Diagnosis not present

## 2015-12-18 DIAGNOSIS — I2510 Atherosclerotic heart disease of native coronary artery without angina pectoris: Secondary | ICD-10-CM | POA: Diagnosis not present

## 2015-12-18 DIAGNOSIS — I69398 Other sequelae of cerebral infarction: Secondary | ICD-10-CM | POA: Diagnosis not present

## 2015-12-20 DIAGNOSIS — I69354 Hemiplegia and hemiparesis following cerebral infarction affecting left non-dominant side: Secondary | ICD-10-CM | POA: Diagnosis not present

## 2015-12-20 DIAGNOSIS — R2689 Other abnormalities of gait and mobility: Secondary | ICD-10-CM | POA: Diagnosis not present

## 2015-12-20 DIAGNOSIS — D509 Iron deficiency anemia, unspecified: Secondary | ICD-10-CM | POA: Diagnosis not present

## 2015-12-20 DIAGNOSIS — I129 Hypertensive chronic kidney disease with stage 1 through stage 4 chronic kidney disease, or unspecified chronic kidney disease: Secondary | ICD-10-CM | POA: Diagnosis not present

## 2015-12-20 DIAGNOSIS — M6281 Muscle weakness (generalized): Secondary | ICD-10-CM | POA: Diagnosis not present

## 2015-12-20 DIAGNOSIS — Z7982 Long term (current) use of aspirin: Secondary | ICD-10-CM | POA: Diagnosis not present

## 2015-12-20 DIAGNOSIS — Z87440 Personal history of urinary (tract) infections: Secondary | ICD-10-CM | POA: Diagnosis not present

## 2015-12-20 DIAGNOSIS — I69398 Other sequelae of cerebral infarction: Secondary | ICD-10-CM | POA: Diagnosis not present

## 2015-12-20 DIAGNOSIS — I2510 Atherosclerotic heart disease of native coronary artery without angina pectoris: Secondary | ICD-10-CM | POA: Diagnosis not present

## 2015-12-20 DIAGNOSIS — N183 Chronic kidney disease, stage 3 (moderate): Secondary | ICD-10-CM | POA: Diagnosis not present

## 2015-12-20 DIAGNOSIS — I480 Paroxysmal atrial fibrillation: Secondary | ICD-10-CM | POA: Diagnosis not present

## 2015-12-23 ENCOUNTER — Telehealth: Payer: Self-pay | Admitting: Internal Medicine

## 2015-12-23 NOTE — Telephone Encounter (Signed)
Geni Bers is calling for verbals on eval order being moved to this week. She was unable to see the patient last week.

## 2015-12-24 DIAGNOSIS — Z87440 Personal history of urinary (tract) infections: Secondary | ICD-10-CM | POA: Diagnosis not present

## 2015-12-24 DIAGNOSIS — R2689 Other abnormalities of gait and mobility: Secondary | ICD-10-CM | POA: Diagnosis not present

## 2015-12-24 DIAGNOSIS — I69398 Other sequelae of cerebral infarction: Secondary | ICD-10-CM | POA: Diagnosis not present

## 2015-12-24 DIAGNOSIS — I129 Hypertensive chronic kidney disease with stage 1 through stage 4 chronic kidney disease, or unspecified chronic kidney disease: Secondary | ICD-10-CM | POA: Diagnosis not present

## 2015-12-24 DIAGNOSIS — N183 Chronic kidney disease, stage 3 (moderate): Secondary | ICD-10-CM | POA: Diagnosis not present

## 2015-12-24 DIAGNOSIS — M6281 Muscle weakness (generalized): Secondary | ICD-10-CM | POA: Diagnosis not present

## 2015-12-24 DIAGNOSIS — I480 Paroxysmal atrial fibrillation: Secondary | ICD-10-CM | POA: Diagnosis not present

## 2015-12-24 DIAGNOSIS — I2510 Atherosclerotic heart disease of native coronary artery without angina pectoris: Secondary | ICD-10-CM | POA: Diagnosis not present

## 2015-12-24 DIAGNOSIS — I69354 Hemiplegia and hemiparesis following cerebral infarction affecting left non-dominant side: Secondary | ICD-10-CM | POA: Diagnosis not present

## 2015-12-24 DIAGNOSIS — D509 Iron deficiency anemia, unspecified: Secondary | ICD-10-CM | POA: Diagnosis not present

## 2015-12-24 DIAGNOSIS — Z7982 Long term (current) use of aspirin: Secondary | ICD-10-CM | POA: Diagnosis not present

## 2015-12-24 NOTE — Telephone Encounter (Signed)
Verbals given  

## 2015-12-25 ENCOUNTER — Ambulatory Visit: Payer: Self-pay | Admitting: Neurology

## 2015-12-26 ENCOUNTER — Ambulatory Visit: Payer: Medicare Other | Admitting: Neurology

## 2015-12-26 DIAGNOSIS — I2510 Atherosclerotic heart disease of native coronary artery without angina pectoris: Secondary | ICD-10-CM | POA: Diagnosis not present

## 2015-12-26 DIAGNOSIS — R2689 Other abnormalities of gait and mobility: Secondary | ICD-10-CM | POA: Diagnosis not present

## 2015-12-26 DIAGNOSIS — I129 Hypertensive chronic kidney disease with stage 1 through stage 4 chronic kidney disease, or unspecified chronic kidney disease: Secondary | ICD-10-CM | POA: Diagnosis not present

## 2015-12-26 DIAGNOSIS — I69354 Hemiplegia and hemiparesis following cerebral infarction affecting left non-dominant side: Secondary | ICD-10-CM | POA: Diagnosis not present

## 2015-12-26 DIAGNOSIS — M6281 Muscle weakness (generalized): Secondary | ICD-10-CM | POA: Diagnosis not present

## 2015-12-26 DIAGNOSIS — I480 Paroxysmal atrial fibrillation: Secondary | ICD-10-CM | POA: Diagnosis not present

## 2015-12-26 DIAGNOSIS — I69398 Other sequelae of cerebral infarction: Secondary | ICD-10-CM | POA: Diagnosis not present

## 2015-12-26 DIAGNOSIS — Z7982 Long term (current) use of aspirin: Secondary | ICD-10-CM | POA: Diagnosis not present

## 2015-12-26 DIAGNOSIS — Z87440 Personal history of urinary (tract) infections: Secondary | ICD-10-CM | POA: Diagnosis not present

## 2015-12-26 DIAGNOSIS — D509 Iron deficiency anemia, unspecified: Secondary | ICD-10-CM | POA: Diagnosis not present

## 2015-12-26 DIAGNOSIS — N183 Chronic kidney disease, stage 3 (moderate): Secondary | ICD-10-CM | POA: Diagnosis not present

## 2016-01-04 ENCOUNTER — Other Ambulatory Visit: Payer: Self-pay | Admitting: Internal Medicine

## 2016-02-20 DIAGNOSIS — Z139 Encounter for screening, unspecified: Secondary | ICD-10-CM

## 2016-02-23 NOTE — Congregational Nurse Program (Signed)
Congregational Nurse Program Note  Date of Encounter: 02/20/2016  Past Medical History: Past Medical History:  Diagnosis Date  . Anemia, iron deficiency 08/15/2011  . CAD (coronary artery disease)    cath in 1/08: EF 60%, mild dilated Ao root, oD2 30%, LAD 20-30%.  . Dilated aortic root ([place]) 08/15/2011  . Dyslipidemia   . Dysrhythmia   . History of MI (myocardial infarction) 08/15/2011  . History of pneumonia 08/15/2011  . History of stroke   . Hyperlipidemia   . Hypertension    echocardiogram 6/10: Moderate LVH, EF 55-65%, mild, mild MR, MAC, mild LAE, PASP 32  . Myocardial infarction ([place])   . Paroxysmal atrial fibrillation (HCC)   . Prediabetes 10/08/2015  . Preoperative cardiovascular examination 10/09/2015  . Shortness of breath dyspnea   . Stroke [facility]) 10/2011    Encounter Details:     CNP Questionnaire - 02/20/16 1544      Patient Demographics   Is this a new or existing patient? New   Patient is considered a/an Not Applicable   Race African-American/Black     Patient Assistance   Location of Patient Assistance Not Applicable   Patient's financial/insurance status Low Income;Medicare   Uninsured Patient No   Patient referred to apply for the following financial assistance Not Applicable   Food insecurities addressed Provided food supplies   Transportation assistance No   Assistance securing medications No   Educational health offerings Hypertension;Chronic disease     Encounter Details   Primary purpose of visit Chronic Illness/Condition Visit;Education/Health Concerns   Was an Emergency Department visit averted? Not Applicable   Does patient have a medical provider? Yes   Patient referred to Not Applicable   Was a mental health screening completed? (GAINS tool) No   Does patient have dental issues? No   Does patient have vision issues? No   Does your patient have an abnormal blood pressure today? Yes   Since previous encounter, have you referred patient for  abnormal blood pressure that resulted in a new diagnosis or medication change? No   Does your patient have an abnormal blood glucose today? No   Since previous encounter, have you referred patient for abnormal blood glucose that resulted in a new diagnosis or medication change? No   Was there a life-saving intervention made? No      B/P check 160/90.  Discussed with client need to try and stay in the lobby as much as possible due to the extreme heat and his B/P and hx of stroke.  Discussed the staff the same.

## 2016-03-03 ENCOUNTER — Ambulatory Visit: Payer: Medicare Other | Admitting: Neurology

## 2016-03-04 ENCOUNTER — Encounter: Payer: Self-pay | Admitting: Neurology

## 2016-03-05 ENCOUNTER — Other Ambulatory Visit: Payer: Self-pay | Admitting: Internal Medicine

## 2016-03-11 ENCOUNTER — Ambulatory Visit (INDEPENDENT_AMBULATORY_CARE_PROVIDER_SITE_OTHER): Payer: Medicare HMO | Admitting: Internal Medicine

## 2016-03-11 VITALS — BP 184/90 | HR 87 | Temp 97.4°F | Resp 16 | Ht 76.0 in | Wt 183.0 lb

## 2016-03-11 DIAGNOSIS — E785 Hyperlipidemia, unspecified: Secondary | ICD-10-CM

## 2016-03-11 DIAGNOSIS — R7303 Prediabetes: Secondary | ICD-10-CM | POA: Diagnosis not present

## 2016-03-11 DIAGNOSIS — I63331 Cerebral infarction due to thrombosis of right posterior cerebral artery: Secondary | ICD-10-CM | POA: Diagnosis not present

## 2016-03-11 DIAGNOSIS — R269 Unspecified abnormalities of gait and mobility: Secondary | ICD-10-CM

## 2016-03-11 DIAGNOSIS — I10 Essential (primary) hypertension: Secondary | ICD-10-CM

## 2016-03-11 MED ORDER — AMLODIPINE BESYLATE 10 MG PO TABS
10.0000 mg | ORAL_TABLET | Freq: Every day | ORAL | 3 refills | Status: DC
Start: 2016-03-11 — End: 2016-12-30

## 2016-03-11 MED ORDER — AMLODIPINE BESYLATE 10 MG PO TABS: 10.0000 mg | ORAL_TABLET | Freq: Every day | ORAL | 3 refills | Status: DC

## 2016-03-11 NOTE — Progress Notes (Signed)
Subjective:    Patient ID: [name], male    DOB: [birth year], 75 y.o.   MRN: [id number]  HPI  Here to f/u; overall doing ok,  Pt denies chest pain, increasing sob or doe, wheezing, orthopnea, PND, increased LE swelling, palpitations, dizziness or syncope.  Pt denies new neurological symptoms such as new headache, or facial or extremity weakness or numbness, but getting gmore weak, off balance, seems to occas drag left leg, asks for PT referral.  Has been through rehab post stroke mar 2017.     Pt denies polydipsia, polyuria, or low sugar episode.   Pt denies new neurological symptoms such as new headache, or facial or extremity weakness or numbness.   Pt states overall good compliance with meds, mostly trying to follow appropriate diet, with wt overall stable Wt Readings from Last 3 Encounters:  03/11/16 183 lb (83 kg)  12/10/15 176 lb (79.8 kg)  11/27/15 164 lb (74.4 kg)  States has been with current insurance for the above, and does not like the rules about copay and referral, so wants to go back to [facility]. Cont's to live by himself. Has run out of his meds, thinks he takes meds from bottles better than with pill dispenser.   Past Medical History:  Diagnosis Date  . Anemia, iron deficiency 08/15/2011  . CAD (coronary artery disease)    cath in 1/08: EF 60%, mild dilated Ao root, oD2 30%, LAD 20-30%.  . Dilated aortic root ([place]) 08/15/2011  . Dyslipidemia   . Dysrhythmia   . History of MI (myocardial infarction) 08/15/2011  . History of pneumonia 08/15/2011  . History of stroke   . Hyperlipidemia   . Hypertension    echocardiogram 6/10: Moderate LVH, EF 55-65%, mild, mild MR, MAC, mild LAE, PASP 32  . Myocardial infarction ([place])   . Paroxysmal atrial fibrillation (HCC)   . Prediabetes 10/08/2015  . Preoperative cardiovascular examination 10/09/2015  . Shortness of breath dyspnea   . Stroke [facility]) 10/2011   Past Surgical History:  Procedure Laterality Date  . CARDIAC CATHETERIZATION      . ENDARTERECTOMY Right 10/10/2015   Procedure: ENDARTERECTOMY CAROTID-RIGHT;  Surgeon: Serafina Mitchell, MD;  Location: [place];  Service: Vascular;  Laterality: Right;    reports that he has never smoked. He has never used smokeless tobacco. He reports that he does not drink alcohol or use drugs. family history includes Aneurysm in his sister. No Known Allergies Current Outpatient Prescriptions on File Prior to Visit  Medication Sig Dispense Refill  . albuterol (PROVENTIL HFA;VENTOLIN HFA) 108 (90 BASE) MCG/ACT inhaler Inhale 2 puffs into the lungs every 6 (six) hours as needed for wheezing or shortness of breath. 1 Inhaler 2  . amLODipine (NORVASC) 10 MG tablet Take 1 tablet (10 mg total) by mouth daily. 30 tablet 0  . apixaban (ELIQUIS) 5 MG TABS tablet Take 1 tablet (5 mg total) by mouth 2 (two) times daily. 60 tablet 11  . aspirin EC 81 MG tablet Take 1 tablet (81 mg total) by mouth daily. 30 tablet 0  . atorvastatin (LIPITOR) 80 MG tablet Take 1 tablet (80 mg total) by mouth daily. 90 tablet 3  . carvedilol (COREG) 3.125 MG tablet Take 3.125 mg by mouth 2 (two) times daily.    . carvedilol (COREG) 3.125 MG tablet TAKE ONE TABLET BY MOUTH TWICE DAILY WITH MEALS 60 tablet 0  . ezetimibe (ZETIA) 10 MG tablet Take 1 tablet (10 mg total) by mouth  daily. 30 tablet 0  . furosemide (LASIX) 20 MG tablet Take 1 tablet (20 mg total) by mouth daily. 30 tablet 1  . isosorbide mononitrate (IMDUR) 30 MG 24 hr tablet Take 1 tablet (30 mg total) by mouth daily. 30 tablet 0  . oxyCODONE (OXY IR/ROXICODONE) 5 MG immediate release tablet Take 1-2 tablets (5-10 mg total) by mouth every 6 (six) hours as needed for moderate pain. 30 tablet 0  . pantoprazole (PROTONIX) 40 MG tablet Take 1 tablet (40 mg total) by mouth daily. 30 tablet 0  . senna-docusate (SENOKOT-S) 8.6-50 MG tablet Take 1 tablet by mouth at bedtime as needed for mild constipation. 15 tablet 0  . tamsulosin (FLOMAX) 0.4 MG CAPS capsule Take 1  capsule (0.4 mg total) by mouth daily after supper. 30 capsule 0   No current facility-administered medications on file prior to visit.     Review of Systems  Constitutional: Negative for unusual diaphoresis or night sweats HENT: Negative for ear swelling or discharge Eyes: Negative for worsening visual haziness  Respiratory: Negative for choking and stridor.   Gastrointestinal: Negative for distension or worsening eructation Genitourinary: Negative for retention or change in urine volume.  Musculoskeletal: Negative for other MSK pain or swelling Skin: Negative for color change and worsening wound Neurological: Negative for tremors and numbness other than noted  Psychiatric/Behavioral: Negative for decreased concentration or agitation other than above       Objective:   Physical Exam BP (!) 184/90   Pulse 87   Temp 97.4 F (36.3 C) (Oral)   Resp 16   Ht 6\' 4"  (1.93 m)   Wt 183 lb (83 kg)   SpO2 98%   BMI 22.28 kg/m  VS noted,  Constitutional: Pt appears in no apparent distress HENT: Head: NCAT.  Right Ear: External ear normal.  Left Ear: External ear normal.  Eyes: . Pupils are equal, round, and reactive to light. Conjunctivae and EOM are normal Neck: Normal range of motion. Neck supple.  Cardiovascular: Normal rate and regular rhythm.   Pulmonary/Chest: Effort normal and breath sounds without rales or wheezing.  Abd:  Soft, NT, ND, + BS Neurological: Pt is alert. not confused but still difficult historian , motor grossly intact, gait somewhat unsteady, with balance difficulty Skin: Skin is warm. No rash, no LE edema Psychiatric: Pt behavior is normal. No agitation.   Most recent echo summary Oct 07, 2015 Study Conclusions - Left ventricle: The cavity size was normal. Wall thickness was   increased in a pattern of severe LVH. Systolic function was   normal. The estimated ejection fraction was in the range of 55%   to 60%. Left ventricular diastolic function parameters  were   normal. - Aortic valve: There was mild regurgitation. - Mitral valve: There was mild regurgitation. - Left atrium: The atrium was moderately dilated.  Most recent cxr Nov 05, 2015 summary IMPRESSION: No active cardiopulmonary disease.  Most recent MRi Oct 11, 2015 IMPRESSION: 1. Evolving, small acute infarcts throughout the right cerebral hemisphere with some areas of new infarction, likely watershed ischemia. 2. Moderate chronic small vessel ischemic disease.    Assessment & Plan:

## 2016-03-11 NOTE — Assessment & Plan Note (Addendum)
[place] for neurology, outpt PT and [facility] referrals, cont same tx - eliquis and statin  Note:  Total time for pt hx, exam, review of record with pt in the room, determination of diagnoses and plan for further eval and tx is > 40 min, with over 50% spent in coordination and counseling of patient

## 2016-03-11 NOTE — Patient Instructions (Signed)
OK to restart the blood pressure medication  Please continue all other medications as before, and refills have been done if requested.  Please have the pharmacy call with any other refills you may need.  Please continue your efforts at being more active, low cholesterol diet, and weight control.  You are otherwise up to date with prevention measures today.  Please keep your appointments with your specialists as you may have planned  You will be contacted regarding the referral for: neurology, Physical Therapy, and [place]  Please go to the LAB in the Basement (turn left off the elevator) for the tests to be done today  You will be contacted by phone if any changes need to be made immediately.  Otherwise, you will receive a letter about your results with an explanation, but please check with MyChart first.  Please remember to sign up for MyChart if you have not done so, as this will be important to you in the future with finding out test results, communicating by private email, and scheduling acute appointments online when needed.  Please return in 3 months, or sooner if needed,

## 2016-03-11 NOTE — Assessment & Plan Note (Signed)
stable overall by history and exam, recent data reviewed with pt, and pt to continue medical treatment as before,  to f/u any worsening symptoms or concerns Lab Results  Component Value Date   LDLCALC 64 12/10/2015

## 2016-03-11 NOTE — Progress Notes (Signed)
Pre visit review using our clinic review tool, if applicable. No additional management support is needed unless otherwise documented below in the visit note. 

## 2016-03-11 NOTE — Assessment & Plan Note (Signed)
Uncontrolled, ran out of BP med, for refill today, cont to monitor at home and next visit

## 2016-03-11 NOTE — Assessment & Plan Note (Signed)
Asympt, for f/u a1c 

## 2016-03-13 ENCOUNTER — Other Ambulatory Visit: Payer: Self-pay

## 2016-03-13 DIAGNOSIS — I10 Essential (primary) hypertension: Secondary | ICD-10-CM

## 2016-03-13 NOTE — Patient Outreach (Signed)
[name]) Care Management  03/13/2016  [name] 09-26-1940 [id number]     Telephone Screen  Referral Date: 03/12/16 Referral Source: MD office Referral Reason: " post stroke med compliance"    Outreach attempt #1 to patient. Patient reached and screening completed.   Social: Patient states he resides in his home alone. He is independent with ADLs/IADLs. He drives himself to medical appts. Denies any recent falls. He does report some residual leg weakness, left side greater than right from last stroke. He has a walker in the home but voices he does not use it often.  Conditions: PMH include uncontrolled HTN, pre-diabetic, anemia, HLD, MI, PAF, CVA x2. Patient reports most recent stroke was about 5-6 months. He voices that he does not monitor BP in the home but has issues with elevated BP. Patient also reports he has been having ongoing issue with prostate and voiding. He recalls that he saw a "doctor at [facility] a year ago" regarding the matter. He states he was supposed to talk with PCP and have a procedure done but nothing ever happened. He reports he continues to have issues with prostate and needs to see specialist.   Medications: Patient voices taking about 10-12 meds. He states he tried using med planner but it was confusing and he messed up meds. He is now just taking pills directly from pill box. Patient unaware of what meds he is taking and why he is taking them. During conversation with RN CM patient voiced that doctor was supposed to put him on med for prostate but didn't. Advised patient that per med list he has Flomax ordered which is for this condition. Patient voices that because he takes so many meds he "gets confused and doesn't know what meds are for." Patient also reports that he is on fixed income and sometimes doesn't have enough money to buy all his meds monthly. He is not interested in [facility] mail order. Patient voices dissatisfaction with  insurance company and will be switching back to [facility] once open enrollment time. He states he looked into Medicaid in the past but income was too high.   Appointments: Patient saw PCP earlier this week on 03/11/16.  Consent: [name] services reviewed and discussed. Verbal consent received from patient for [facility] services.   Plan: RN CM will notify [name] administrative assistant of case status. RN CM will send [facility] community referral for further in home eval and assessment of care needs. RN CM will send [facility] pharmacy referral for medication management and adherence. RN CM provided patient with [name] contact info.  [name], RN,BSN,CCM [place] Management Telephonic Care Management Coordinator Direct Phone: [phone] Toll Free: [phone] Fax: [phone]

## 2016-03-17 ENCOUNTER — Telehealth: Payer: Self-pay

## 2016-03-17 MED ORDER — ISOSORBIDE MONONITRATE ER 30 MG PO TB24: 30.0000 mg | ORAL_TABLET | Freq: Every day | ORAL | 0 refills | Status: DC

## 2016-03-17 NOTE — Telephone Encounter (Signed)
Refill sent to pharmacy.   

## 2016-03-17 NOTE — Addendum Note (Signed)
Addended by: [name] C on: 03/17/2016 01:30 PM   Modules accepted: Orders

## 2016-03-17 NOTE — Telephone Encounter (Signed)
Ok for refill? 

## 2016-03-17 NOTE — Telephone Encounter (Signed)
Please advise patient is requesting a refill on isosorb-mono ER 30 MG tab. I don't see where this has been filled since March and this was filled by another physician please advise if this is ok to do.

## 2016-03-19 ENCOUNTER — Other Ambulatory Visit: Payer: Self-pay | Admitting: *Deleted

## 2016-03-19 NOTE — Patient Outreach (Signed)
[facility] [facility]) Care Management  03/19/2016  [name] 01/23/1941 [id number]   Referral received from telephonic care manager for concern of uncontrolled hypertension and issues with medication compliance.  According to chart, he also has a history of a stroke, hyperlipidemia, and heart attack.    Call placed to member, identity verified.  This care manager introduced self and purpose of call.  [facility] are management services explained.  He begins to discuss some of his concerns with his prostate, but then states that he is not home and is unable to elaborate.  He agrees to initial home visit next week.  He denies any urgent concerns and wish to discuss concerns more during home visit.  [name], [place], MSN [place] [phone]

## 2016-03-24 ENCOUNTER — Other Ambulatory Visit: Payer: Self-pay | Admitting: *Deleted

## 2016-03-24 NOTE — Patient Outreach (Signed)
[place]) Care Management  03/24/2016  [name] 1941/02/12 [id number]   Call placed to member approximately 0900 this am to confirm home visit for this morning as he requested during last conversation to call as a reminder, no answer.  HIPAA compliant voice message left.  This care manager proceeded with planned home visit.  Arrived at [facility] home, knocked several times with no answer.  Talking heard inside the home.  Call placed to member while outside the member's home, no answer.  Another HIPAA compliant voice message left.  Waited approximately 10 minutes prior to leaving the home, still no answer to the door.  Will await call back, will follow up next week to attempt to reschedule.  [name], [place], MSN [place] [phone]

## 2016-03-30 ENCOUNTER — Other Ambulatory Visit: Payer: Self-pay | Admitting: *Deleted

## 2016-03-30 NOTE — Patient Outreach (Signed)
[facility]) Care Management  03/30/2016  [name] 27-Aug-1940 [id number]   Call placed to member to follow up on "no show" home visit last week.  He verifies identity.  Attempt made to begin initial assessment, however member has bad reception and conversation is very broken up.  This care manager was able to schedule initial home visit for next month, will follow up again next week in effort to complete the initial assessment.    [name], [place], MSN [place] [phone]

## 2016-04-03 ENCOUNTER — Other Ambulatory Visit: Payer: Self-pay | Admitting: *Deleted

## 2016-04-03 NOTE — Patient Outreach (Signed)
[facility]) Care Management  04/03/2016  STARR ALZATE October 10, 1940 [id number]   Call placed to member in attempt to complete initial assessment and to address concerns.  He state this is not a good time, and again prefer to address concerns during home visit.  Home visit scheduled within the next 2 weeks.  [name], [place], MSN [place] [phone]

## 2016-04-08 ENCOUNTER — Encounter: Payer: Self-pay | Admitting: Internal Medicine

## 2016-04-10 ENCOUNTER — Other Ambulatory Visit: Payer: Self-pay | Admitting: Pharmacist

## 2016-04-10 NOTE — Patient Outreach (Signed)
[facility] [facility]) Care Management  04/10/2016  [name] 03-03-1941 [id number]  Patient was referred to [place] by [facility] RN Telephonic, Kandra Nicolas, due to patient report of medication confusion and cost concerns for his medications.   He is also active with [place] [place].    Unsuccessful outreach attempt made to patient.  Left a HIPAA compliant voicemail requesting a return call.   Plan:  Will make another outreach attempt to patient next week.   Karrie Meres, PharmD, [place] [phone]

## 2016-04-14 ENCOUNTER — Other Ambulatory Visit: Payer: Self-pay | Admitting: Pharmacist

## 2016-04-14 NOTE — Patient Outreach (Signed)
[facility]) Care Management  04/14/2016  [name] 1941-01-09 [id number]  Patient was referred to [place] by Kandra Nicolas [facility] RN Telephonic.  He is also receiving [facility] services from [place], [place]    Second outreach attempt to patient was successful.  He verified his name/date of birth.  Purpose of call was explained to patient, that he was referred to [facility] Pharmacist due to concerns he expressed with medication cost.   Patient expressed dissatisfaction with his current MA-PDP and states he intends to change.  Counseled patient on upcoming Medicare open enrollment beginning next month when Medicare beneficiaries are able to select a different plan for the following year.    The phone conversation was broken up intermittently due to bad phone reception---asked patient if I could call him back from a different phone, and he stated he would prefer a call back on Saturday at 1330.  Advised patient that I could call him back next week, and he accepted.    Plan:  Will attempt to reach patient again within the next week.   Karrie Meres, PharmD, [place] [phone]

## 2016-04-15 ENCOUNTER — Ambulatory Visit: Payer: Self-pay | Admitting: *Deleted

## 2016-04-15 ENCOUNTER — Other Ambulatory Visit: Payer: Self-pay | Admitting: *Deleted

## 2016-04-15 NOTE — Patient Outreach (Signed)
[place] [facility]) Care Management  04/15/2016  [name] November 19, 1940 [id number]   Call placed to member to confirm home visit for this morning.  He state that today is not a good day for him as he is "getting ready to go."  He requests to reschedule visit, which has been rescheduled for next week.  This will make the 3rd attempt to complete initial home visit.  If unable to complete next week, will discuss with member case closure due to difficulty with contact.  [name], [place], MSN [place] [phone]

## 2016-04-20 ENCOUNTER — Other Ambulatory Visit: Payer: Self-pay | Admitting: Pharmacist

## 2016-04-20 NOTE — Patient Outreach (Signed)
[place] [facility]) Care Management  04/20/2016  AUBREE LLORENS 1941/01/22 [id number]  Unsuccessful attempt to contact patient via phone today at the scheduled time he requested last week.  Phone went right to voicemail, HIPAA compliant voicemail left requesting a return call from patient.   Per review of notes, [place] has also had difficulty being able to schedule a home visit with patient.    Plan:  Will make another outreach attempt within the next week if no return call from patient.    Karrie Meres, PharmD, [place] [phone]

## 2016-04-22 ENCOUNTER — Other Ambulatory Visit: Payer: Self-pay | Admitting: *Deleted

## 2016-04-22 ENCOUNTER — Encounter: Payer: Self-pay | Admitting: *Deleted

## 2016-04-22 NOTE — Patient Outreach (Signed)
[place]) Care Management  04/22/2016  AXETON MURE 13-Aug-1940 [id number]   Call placed to member to confirm home visit for this morning, no answer, goes directly to voice mail.  HIPAA compliant voice message left.  Will await call back.  This is the 3rd scheduled home visit that member has not been available for without notice.  Will not proceed with home visit unless member provides call back and confirms that he will be home.  Will send outreach letter as member has been difficult to contact with this care manager and [facility] pharmacist.  If no response from outreach letter in 10 days, will close case.  [name], [place], MSN [place] [phone]

## 2016-04-23 ENCOUNTER — Other Ambulatory Visit: Payer: Self-pay | Admitting: Pharmacist

## 2016-04-23 NOTE — Patient Outreach (Signed)
[place] [facility]) Care Management  04/23/2016  POOKELA BOCKENSTEDT August 28, 1940 [id number]  Unsuccessful outreach attempt to reach patient.  No answer, phone went directly to voicemail.  HIPAA complaint voicemail left requesting a return call.   Review of chart indicated [place] has also had difficulty reaching patient and has sent an outreach letter to patient yesterday.    Plan:  Will await return call from patient or response to outreach letter mailed by [facility] RN, if no response, will close case.    Karrie Meres, PharmD, [place] [phone]

## 2016-05-06 ENCOUNTER — Other Ambulatory Visit: Payer: Self-pay | Admitting: *Deleted

## 2016-05-06 ENCOUNTER — Encounter: Payer: Self-pay | Admitting: *Deleted

## 2016-05-06 NOTE — Patient Outreach (Signed)
[facility]) Care Management  05/06/2016  DUDE TONA 1941-01-23 [id number]   Outreach letter sent on 9/20, no response.  Will close case.  Will send notification to member, PCP, and care management assistant.  [name], [place], MSN [place] [phone]

## 2016-05-08 ENCOUNTER — Other Ambulatory Visit: Payer: Self-pay | Admitting: Pharmacist

## 2016-05-08 NOTE — Patient Outreach (Signed)
[place] [facility]) Care Management  05/08/2016  [name] 1941-04-09 [id number]  Multiple unsuccessful phone calls to patient, with no return call from patient.  [place] sent patient an outreach letter 04/22/16 with no response from patient.   [facility] RN sent MD and patient case closure letters.   Will close case due to inability to maintain contact with patient.   Karrie Meres, PharmD, [place] [phone]

## 2016-08-04 ENCOUNTER — Ambulatory Visit: Payer: Medicare HMO | Admitting: Internal Medicine

## 2016-12-03 ENCOUNTER — Ambulatory Visit (INDEPENDENT_AMBULATORY_CARE_PROVIDER_SITE_OTHER): Payer: Medicare Other | Admitting: Internal Medicine

## 2016-12-03 ENCOUNTER — Encounter: Payer: Self-pay | Admitting: Internal Medicine

## 2016-12-03 VITALS — BP 150/90 | HR 87 | Ht 66.5 in | Wt 172.0 lb

## 2016-12-03 DIAGNOSIS — G4700 Insomnia, unspecified: Secondary | ICD-10-CM | POA: Diagnosis not present

## 2016-12-03 DIAGNOSIS — R531 Weakness: Secondary | ICD-10-CM

## 2016-12-03 DIAGNOSIS — I10 Essential (primary) hypertension: Secondary | ICD-10-CM | POA: Diagnosis not present

## 2016-12-03 MED ORDER — AZITHROMYCIN 250 MG PO TABS: ORAL_TABLET | ORAL | 1 refills | Status: DC

## 2016-12-03 MED ORDER — TRAZODONE HCL 50 MG PO TABS: 25.0000 mg | ORAL_TABLET | Freq: Every evening | ORAL | 5 refills | Status: DC | PRN

## 2016-12-03 NOTE — Patient Instructions (Signed)
Please take all new medication as prescribed - the antibiotic, and the new medication for sleep (trazodone) as needed  You will be contacted regarding the referral for: Dr Smith/sports medicine to see if there is a reason for the left leg giving out  Please always walk with your cane to help reduce then chance of falling  Please continue all other medications as before, and refills have been done if requested.  Please have the pharmacy call with any other refills you may need.  Please keep your appointments with your specialists as you may have planned  Please return in 3 months, or sooner if needed

## 2016-12-03 NOTE — Progress Notes (Signed)
Pre visit review using our clinic review tool, if applicable. No additional management support is needed unless otherwise documented below in the visit note. 

## 2016-12-03 NOTE — Progress Notes (Signed)
Subjective:    Patient ID: [name], male    DOB: 04-17-1941, 76 y.o.   MRN: [id number]  HPI   Here with 2-3 days acute onset fever, facial pain, pressure, headache, general weakness and malaise, and greenish d/c, with mild ST and cough, but pt denies chest pain, wheezing, increased sob or doe, orthopnea, PND, increased LE swelling, palpitations, dizziness or syncope.  Has intermittent LLE weakness not always assoc with back pain, but no giveaways or falls.  Pt denies new neurological symptoms such as new headache, or facial or extremity weakness or numbness   Pt denies polydipsia, polyuria.  Also with difficulty sleeping recently, just cant get to sleep well most nights, Denies worsening depressive symptoms, suicidal ideation, or panic; has ongoing anxiety Past Medical History:  Diagnosis Date  . Anemia, iron deficiency 08/15/2011  . CAD (coronary artery disease)    cath in 1/08: EF 60%, mild dilated Ao root, oD2 30%, LAD 20-30%.  . Dilated aortic root ([place]) 08/15/2011  . Dyslipidemia   . Dysrhythmia   . History of MI (myocardial infarction) 08/15/2011  . History of pneumonia 08/15/2011  . History of stroke   . Hyperlipidemia   . Hypertension    echocardiogram 6/10: Moderate LVH, EF 55-65%, mild, mild MR, MAC, mild LAE, PASP 32  . Myocardial infarction ([place])   . Paroxysmal atrial fibrillation (HCC)   . Prediabetes 10/08/2015  . Preoperative cardiovascular examination 10/09/2015  . Shortness of breath dyspnea   . Stroke [facility]) 10/2011   Past Surgical History:  Procedure Laterality Date  . CARDIAC CATHETERIZATION    . ENDARTERECTOMY Right 10/10/2015   Procedure: ENDARTERECTOMY CAROTID-RIGHT;  Surgeon: Serafina Mitchell, MD;  Location: [place];  Service: Vascular;  Laterality: Right;    reports that he has never smoked. He has never used smokeless tobacco. He reports that he does not drink alcohol or use drugs. family history includes Aneurysm in his sister. No Known Allergies Current  Outpatient Prescriptions on File Prior to Visit  Medication Sig Dispense Refill  . albuterol (PROVENTIL HFA;VENTOLIN HFA) 108 (90 BASE) MCG/ACT inhaler Inhale 2 puffs into the lungs every 6 (six) hours as needed for wheezing or shortness of breath. 1 Inhaler 2  . amLODipine (NORVASC) 10 MG tablet Take 1 tablet (10 mg total) by mouth daily. 90 tablet 3  . apixaban (ELIQUIS) 5 MG TABS tablet Take 1 tablet (5 mg total) by mouth 2 (two) times daily. 60 tablet 11  . aspirin EC 81 MG tablet Take 1 tablet (81 mg total) by mouth daily. 30 tablet 0  . atorvastatin (LIPITOR) 80 MG tablet Take 1 tablet (80 mg total) by mouth daily. 90 tablet 3  . carvedilol (COREG) 3.125 MG tablet TAKE ONE TABLET BY MOUTH TWICE DAILY WITH MEALS 60 tablet 0  . ezetimibe (ZETIA) 10 MG tablet Take 1 tablet (10 mg total) by mouth daily. 30 tablet 0  . furosemide (LASIX) 20 MG tablet Take 1 tablet (20 mg total) by mouth daily. 30 tablet 1  . isosorbide mononitrate (IMDUR) 30 MG 24 hr tablet Take 1 tablet (30 mg total) by mouth daily. 30 tablet 0  . oxyCODONE (OXY IR/ROXICODONE) 5 MG immediate release tablet Take 1-2 tablets (5-10 mg total) by mouth every 6 (six) hours as needed for moderate pain. 30 tablet 0  . pantoprazole (PROTONIX) 40 MG tablet Take 1 tablet (40 mg total) by mouth daily. 30 tablet 0  . senna-docusate (SENOKOT-S) 8.6-50 MG tablet Take 1  tablet by mouth at bedtime as needed for mild constipation. 15 tablet 0  . tamsulosin (FLOMAX) 0.4 MG CAPS capsule Take 1 capsule (0.4 mg total) by mouth daily after supper. 30 capsule 0   No current facility-administered medications on file prior to visit.    Review of Systems  Constitutional: Negative for other unusual diaphoresis or sweats HENT: Negative for ear discharge or swelling Eyes: Negative for other worsening visual disturbances Respiratory: Negative for stridor or other swelling  Gastrointestinal: Negative for worsening distension or other blood Genitourinary:  Negative for retention or other urinary change Musculoskeletal: Negative for other MSK pain or swelling Skin: Negative for color change or other new lesions Neurological: Negative for worsening tremors and other numbness  Psychiatric/Behavioral: Negative for worsening agitation or other fatigue All other system neg per pt    Objective:   Physical Exam BP (!) 150/90   Pulse 87   Ht 5' 6.5" (1.689 m)   Wt 172 lb (78 kg)   SpO2 98%   BMI 27.35 kg/m  VS noted, mild ill Constitutional: Pt appears in NAD HENT: Head: NCAT.  Right Ear: External ear normal.  Left Ear: External ear normal.  Eyes: . Pupils are equal, round, and reactive to light. Conjunctivae and EOM are normal Bilat tm's with mild erythema.  Max sinus areas mild tender.  Pharynx with mild erythema, no exudate Nose: without d/c or deformity Neck: Neck supple. Gross normal ROM Cardiovascular: Normal rate and regular rhythm.   Pulmonary/Chest: Effort normal and breath sounds without rales or wheezing.  Neurological: Pt is alert. At baseline orientation, motor grossly intact Skin: Skin is warm. No rashes, other new lesions, no LE edema Psychiatric: Pt behavior is normal without agitation , 1+ nervous No other exam findings    Assessment & Plan:

## 2016-12-06 DIAGNOSIS — G4700 Insomnia, unspecified: Secondary | ICD-10-CM | POA: Insufficient documentation

## 2016-12-06 NOTE — Assessment & Plan Note (Signed)
[place] for trial trazodone qhs prn,  to f/u any worsening symptoms or concerns

## 2016-12-06 NOTE — Assessment & Plan Note (Signed)
Improved mild elevated today, ? Some reactive element today, o/w stable overall by history and exam, recent data reviewed with pt, and pt to continue medical treatment as before,  to f/u any worsening symptoms or concerns BP Readings from Last 3 Encounters:  12/03/16 (!) 150/90  03/11/16 (!) 184/90  02/20/16 (!) 160/90

## 2016-12-06 NOTE — Assessment & Plan Note (Signed)
Intermittent LLE with near giveaways but no falls, ok for refer Sport med for further consideration

## 2016-12-19 IMAGING — MR MR HEAD W/O CM
9 of 10 series · 35 of 48 positions shown · non-contrast
Comparison: Head CT 10/11/2015 and MRI 10/06/2015

CLINICAL DATA: Acute encephalopathy. Episode of altered mental
status in the bathroom earlier today.

EXAM:
MRI HEAD WITHOUT CONTRAST
TECHNIQUE: Multiplanar, multiecho pulse sequences of the brain and surrounding
structures were obtained without intravenous contrast.

[Series 3: T1 · sagittal · 5.0mm · 0.47mm/px · 1 of 23 slices shown]
[im 1/23]
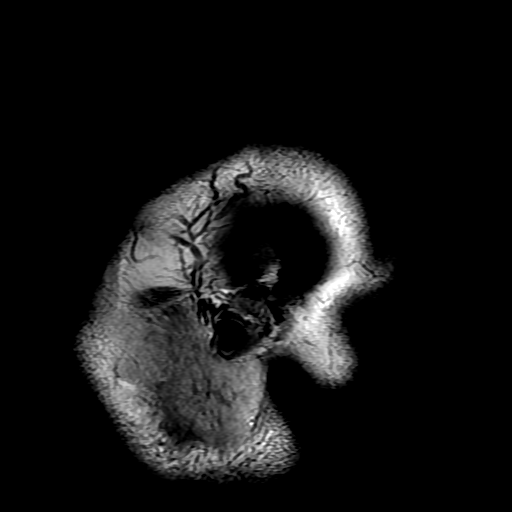

[Series 4: DWI · axial · 3.0mm · 1.09mm/px · z∈[-32,+112]mm · 9 of 98 slices shown (1 of 4)]
[im 1/98]
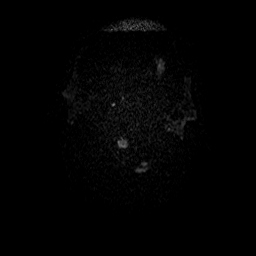
[im 13/98]
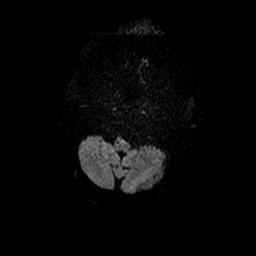
[im 25/98]
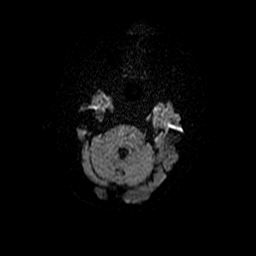
[im 37/98]
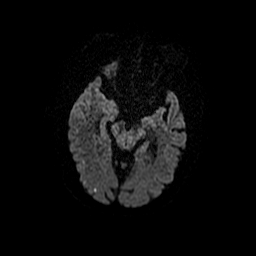
[im 49/98]
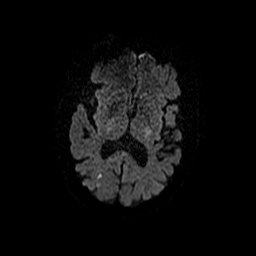
[im 61/98]
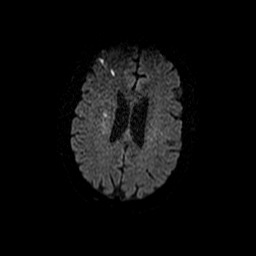
[im 73/98]
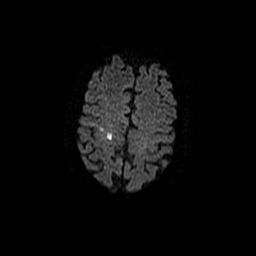
[im 85/98]
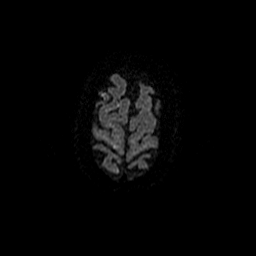
[im 98/98]
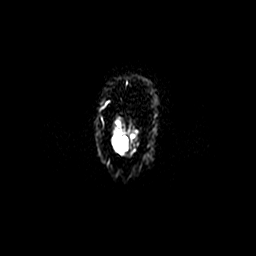

[Series 5: T2 · axial · 5.0mm · 0.43mm/px · z∈[-44,+118]mm · 3 of 28 slices shown (1 of 2)]
[im 1/28]
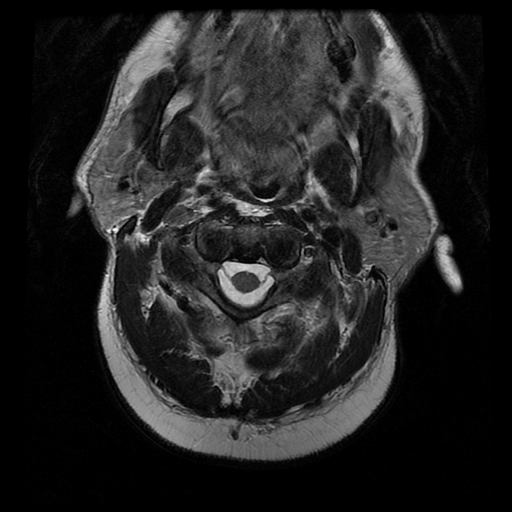
[im 14/28]
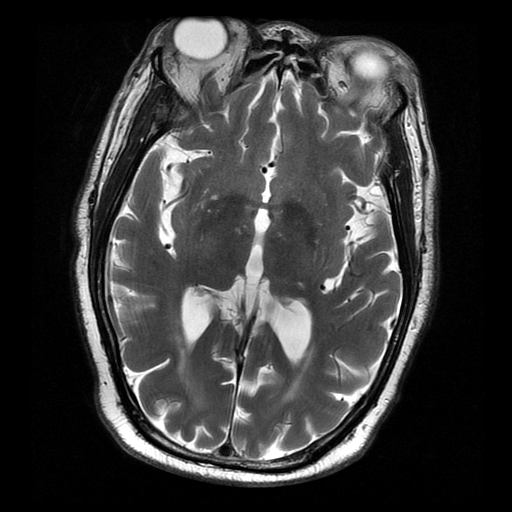
[im 28/28]
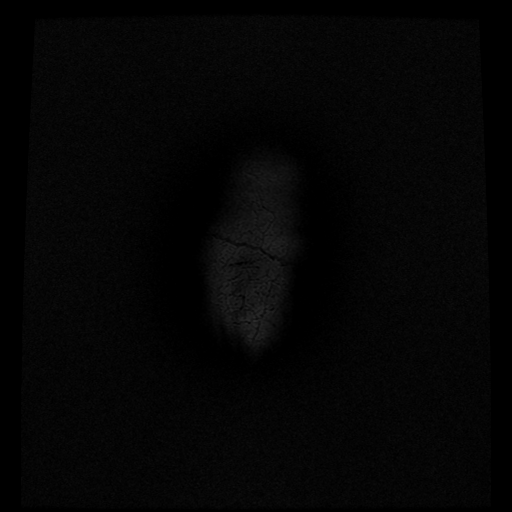

[Series 6: DWI · coronal · 5.0mm · 1.09mm/px · 7 of 68 slices shown (2 of 4)]
[im 1/68]
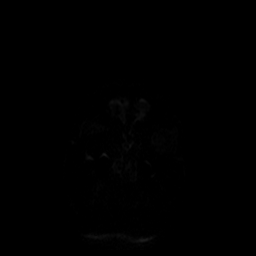
[im 12/68]
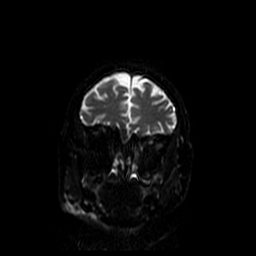
[im 23/68]
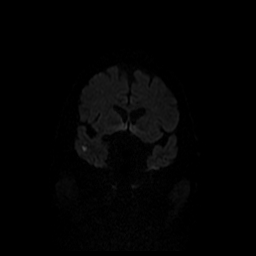
[im 34/68]
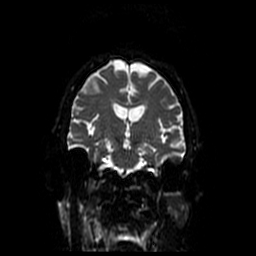
[im 45/68]
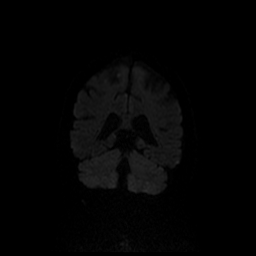
[im 56/68]
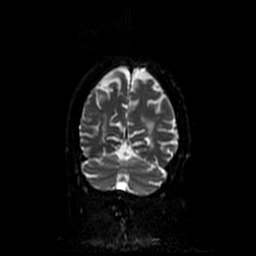
[im 68/68]
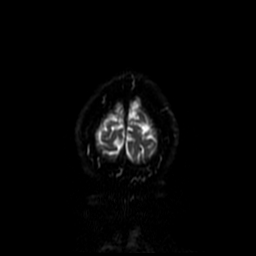

[Series 7: FLAIR · axial · 5.0mm · 0.43mm/px · z∈[-44,+118]mm · 3 of 28 slices shown]
[im 1/28]
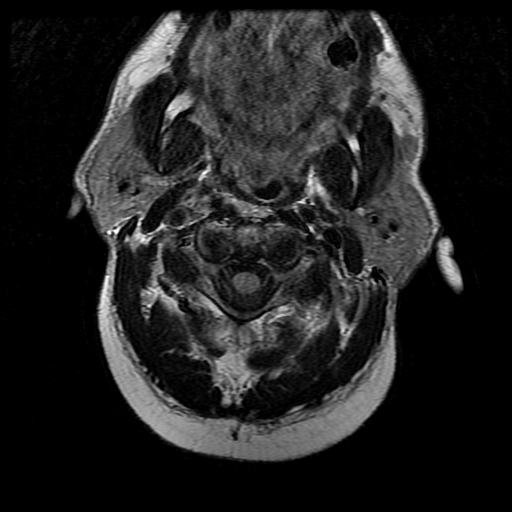
[im 14/28]
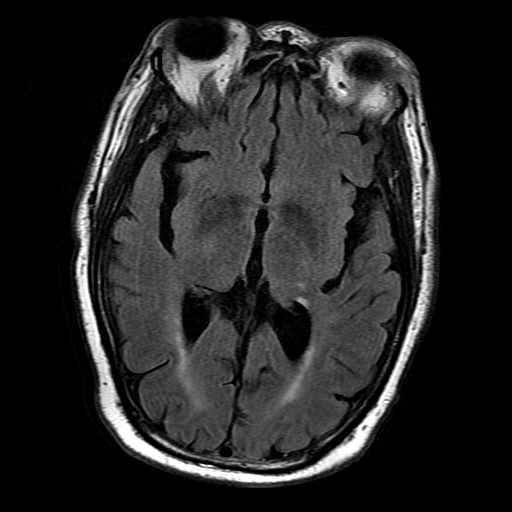
[im 28/28]
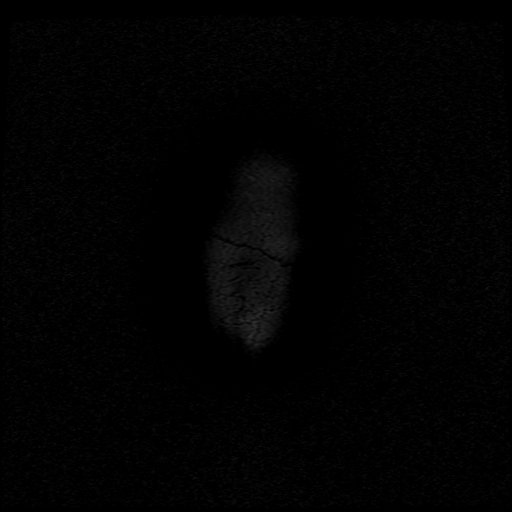

[Series 8: ax mpgr · axial · 5.0mm · 0.43mm/px · 1 of 28 slices shown]
[im 1/28]
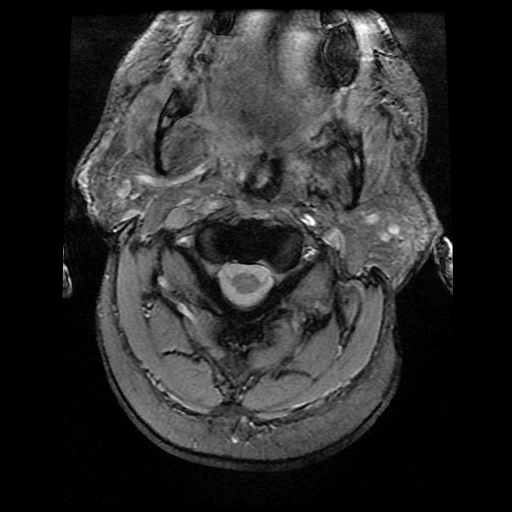

[Series 9: T2 · coronal · 5.0mm · 0.39mm/px · 3 of 26 slices shown (2 of 2)]
[im 1/26]
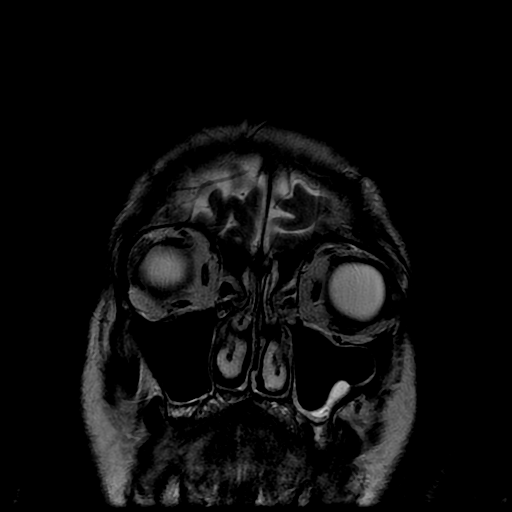
[im 13/26]
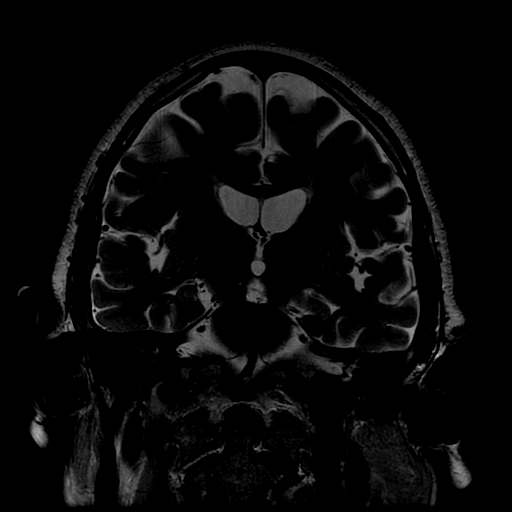
[im 26/26]
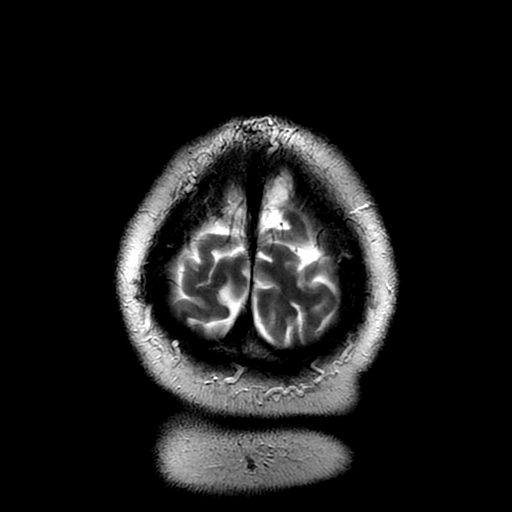

[Series 400: DWI · axial · 3.0mm · 1.09mm/px · z∈[-32,+112]mm · 5 of 49 slices shown (3 of 4)]
[im 1/49]
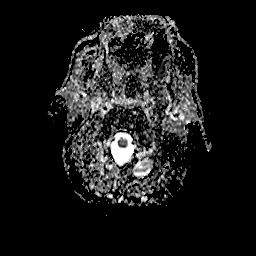
[im 13/49]
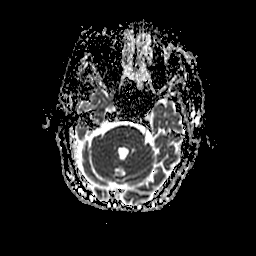
[im 25/49]
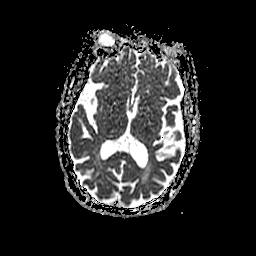
[im 37/49]
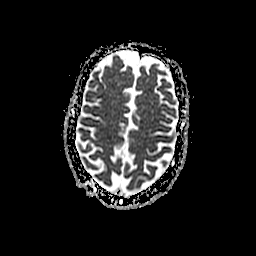
[im 49/49]
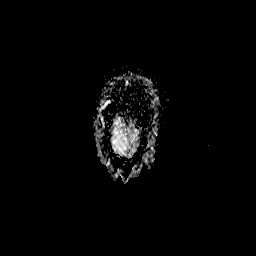

[Series 600: DWI · coronal · 5.0mm · 1.09mm/px · 3 of 34 slices shown (4 of 4)]
[im 1/34]
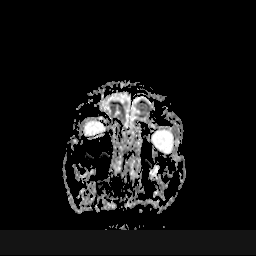
[im 17/34]
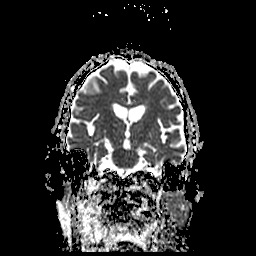
[im 34/34]
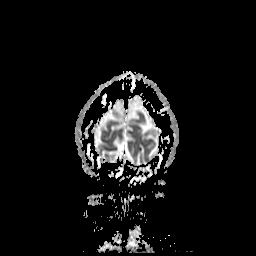

[35 of 48 positions shown; findings below may reference images not displayed]

FINDINGS: Multiple small foci of restricted diffusion are again seen in the
right cerebral hemisphere involving cortex and white matter. Some of
these have decreased in conspicuity compared to the prior MRI,
however there are scattered areas of acute infarction which are new
or slightly larger than on the prior study. These have been marked
with arrows on axial diffusion series 4 and are present in the right
parietal cortex, right centrum semiovale and corona radiata,
anterior right frontal lobe, right occipital cortex and white
matter, and right temporal lobe.

There is no evidence of intracranial hemorrhage, mass, midline
shift, or extra-axial fluid collection. There is mild cerebral
atrophy. Patchy T2 hyperintensities in the cerebral white matter
bilaterally are compatible with moderate chronic small vessel
ischemic disease. Chronic lacunar infarcts are noted in the thalami
and deep cerebral white matter bilaterally.

Orbits are unremarkable. Mild paranasal sinus mucosal thickening is
noted. Mastoid air cells are clear. Major intracranial vascular flow
voids are preserved, with evidence of distal right ICA stenosis
better evaluated on recent CTA.
IMPRESSION: 1. Evolving, small acute infarcts throughout the right cerebral
hemisphere with some areas of new infarction, likely watershed
ischemia.
2. Moderate chronic small vessel ischemic disease.

## 2016-12-22 ENCOUNTER — Encounter: Payer: Self-pay | Admitting: Gastroenterology

## 2016-12-23 ENCOUNTER — Encounter: Payer: Self-pay | Admitting: Family Medicine

## 2016-12-23 ENCOUNTER — Ambulatory Visit (INDEPENDENT_AMBULATORY_CARE_PROVIDER_SITE_OTHER): Payer: Medicare Other | Admitting: Family Medicine

## 2016-12-23 DIAGNOSIS — R531 Weakness: Secondary | ICD-10-CM

## 2016-12-23 NOTE — Patient Instructions (Signed)
Good to see you I think your leg is doing pretty well  Exercises 3 times a week.  Keep wearing good shoes.  Continue all your medicine Over the counter get iron 65mg  and 500mg  of vitamin C and take it 3 times a week. Vitamin D 2000 IU daily  Compression socks may help as well.  See me again in 4 weeks if it continues

## 2016-12-23 NOTE — Progress Notes (Signed)
[name] [place], [place] [address] Phone: [phone] Subjective:    I'm seeing this patient by the request  of:    CC: Left leg pain  [id number]  [name] is a 76 y.o. male coming in with complaint of leg pain  Past pedicle history significant for right-sided cerebrovascular accident. States that he is had this weakness intermittently for some time. Seems to be worsening. Sometimes feels like his foot and ankle don't seem to be working appropriately. Then other times seems to do fine. Does not remember any true injury. Patient denies any numbness. States though that there is a tingling sensation that can occur. Patient denies any swelling. Sometimes feels like he doesn't wear his foot isn't space.    Past Medical History:  Diagnosis Date  . Anemia, iron deficiency 08/15/2011  . CAD (coronary artery disease)    cath in 1/08: EF 60%, mild dilated Ao root, oD2 30%, LAD 20-30%.  . Dilated aortic root ([place]) 08/15/2011  . Dyslipidemia   . Dysrhythmia   . History of MI (myocardial infarction) 08/15/2011  . History of pneumonia 08/15/2011  . History of stroke   . Hyperlipidemia   . Hypertension    echocardiogram 6/10: Moderate LVH, EF 55-65%, mild, mild MR, MAC, mild LAE, PASP 32  . Myocardial infarction ([place])   . Paroxysmal atrial fibrillation (HCC)   . Prediabetes 10/08/2015  . Preoperative cardiovascular examination 10/09/2015  . Shortness of breath dyspnea   . Stroke [facility]) 10/2011   Past Surgical History:  Procedure Laterality Date  . CARDIAC CATHETERIZATION    . ENDARTERECTOMY Right 10/10/2015   Procedure: ENDARTERECTOMY CAROTID-RIGHT;  Surgeon: Serafina Mitchell, MD;  Location: [facility] OR;  Service: Vascular;  Laterality: Right;   Social History   Social History  . Marital status: Widowed    Spouse name: N/A  . Number of children: 2  . Years of education: N/A   Occupational History  . retired    Social History Main Topics    . Smoking status: Never Smoker  . Smokeless tobacco: Never Used  . Alcohol use No  . Drug use: No  . Sexual activity: Not Asked   Other Topics Concern  . None   Social History Narrative  . None   No Known Allergies Family History  Problem Relation Age of Onset  . Colon cancer Neg Hx   . Aneurysm Sister     Past medical history, social, surgical and family history all reviewed in electronic medical record.  No pertanent information unless stated regarding to the chief complaint.   Review of Systems:Review of systems updated and as accurate as of 12/23/16  No headache, visual changes, nausea, vomiting, diarrhea, constipation, dizziness, abdominal pain, skin rash, fevers, chills, night sweats, weight loss, swollen lymph nodes,  chest pain, shortness of breath, mood changes.  Positive for muscle aches body aches  Objective  Blood pressure (!) 168/90, pulse 80, temperature 97.7 F (36.5 C), temperature source Oral, height 6' (1.829 m), weight 163 lb (73.9 kg), SpO2 99 %. Systems examined below as of 12/23/16   General: No apparent distress alert But patient is not oriented. Seems to have some baseline dementia. HEENT: Pupils equal, extraocular movements intact  Respiratory: Patient's speak in full sentences and does not appear short of breath  Cardiovascular: No lower extremity edema, non tender, no erythema  Skin: Warm dry intact with no signs of infection or rash on extremities or on  axial skeleton.  Abdomen: Soft nontender  Neuro: Cranial nerves II through XII are intact, neurovascularly intact in all extremities with 2+ DTRs and 2+ pulses.  Lymph: No lymphadenopathy of posterior or anterior cervical chain or axillae bilaterally.  Gait normal with good balance and coordination.  MSK:  Non tender with full range of motion and good stability and symmetric strength and tone of shoulders, elbows, wrist, hip, knee and ankles bilaterally.  No findings of the lower extremity  today.    Impression and Recommendations:     This case required medical decision making of moderate complexity.      Note: This dictation was prepared with Dragon dictation along with smaller phrase technology. Any transcriptional errors that result from this process are unintentional.

## 2016-12-23 NOTE — Assessment & Plan Note (Signed)
Patient complains a left leg pain. Seems her left-sided weakness. With patient's past medical history of a right-sided cerebrovascular accident and believe that this is more of some post stroke difficulties. Discussed with patient about over-the-counter medications. Given exercises for any type of ankle strengthening. No significant weakness noted today. Patient denies any back pain. Discussed being conservative with any of his pain medications. Discussed with him as well to further evaluate if any of his medications seem to have any association. Patient will try to make these changes and come back and see me again in 4 weeks.

## 2016-12-25 ENCOUNTER — Ambulatory Visit: Payer: Medicare Other | Admitting: Internal Medicine

## 2016-12-30 ENCOUNTER — Ambulatory Visit (INDEPENDENT_AMBULATORY_CARE_PROVIDER_SITE_OTHER): Payer: Medicare Other | Admitting: Internal Medicine

## 2016-12-30 ENCOUNTER — Encounter: Payer: Self-pay | Admitting: Internal Medicine

## 2016-12-30 VITALS — BP 136/92 | HR 84 | Ht 66.5 in | Wt 168.0 lb

## 2016-12-30 DIAGNOSIS — I10 Essential (primary) hypertension: Secondary | ICD-10-CM

## 2016-12-30 DIAGNOSIS — R0609 Other forms of dyspnea: Secondary | ICD-10-CM | POA: Diagnosis not present

## 2016-12-30 DIAGNOSIS — Z7901 Long term (current) use of anticoagulants: Secondary | ICD-10-CM

## 2016-12-30 MED ORDER — CARVEDILOL 6.25 MG PO TABS: 6.2500 mg | ORAL_TABLET | Freq: Two times a day (BID) | ORAL | 11 refills | Status: DC

## 2016-12-30 MED ORDER — FUROSEMIDE 20 MG PO TABS: 20.0000 mg | ORAL_TABLET | Freq: Every day | ORAL | 3 refills | Status: DC

## 2016-12-30 MED ORDER — APIXABAN 5 MG PO TABS: 5.0000 mg | ORAL_TABLET | Freq: Two times a day (BID) | ORAL | 11 refills | Status: DC

## 2016-12-30 MED ORDER — ALBUTEROL SULFATE HFA 108 (90 BASE) MCG/ACT IN AERS: 2.0000 | INHALATION_SPRAY | Freq: Four times a day (QID) | RESPIRATORY_TRACT | 2 refills | Status: DC | PRN

## 2016-12-30 NOTE — Progress Notes (Signed)
Subjective:    Patient ID: [name], male    DOB: Jun 28, 1941, 76 y.o.   MRN: [id number]  HPI  Here to f/u alone, is difficult historian due to cognitive impairment s/p recent CVA, here to c/o 1 wk worsening sob/doe with wheezing and intermittent sense of tachypnea without HA, ST, cough, sinus congestion or ear symptoms, all after admits he has not taken any meds > 2 wks due to insufficient funds but plans to restart as soon as he can and wants to know his essential "most needed meds".   Has gained some wt but Pt denies chest pain, orthopnea, PND, increased LE swelling, palpitations, dizziness or syncope.  Wt Readings from Last 3 Encounters:  12/30/16 168 lb (76.2 kg)  12/23/16 163 lb (73.9 kg)  12/03/16 172 lb (78 kg)  Pt denies new neurological symptoms such as new headache, or facial or extremity weakness or numbness   Pt denies polydipsia, polyuria.  Pt denies fever, wt loss, night sweats, loss of appetite, or other constitutional symptoms Denies worsening depressive symptoms, suicidal ideation, or panic Past Medical History:  Diagnosis Date  . Anemia, iron deficiency 08/15/2011  . CAD (coronary artery disease)    cath in 1/08: EF 60%, mild dilated Ao root, oD2 30%, LAD 20-30%.  . Dilated aortic root ([place]) 08/15/2011  . Dyslipidemia   . Dysrhythmia   . History of MI (myocardial infarction) 08/15/2011  . History of pneumonia 08/15/2011  . History of stroke   . Hyperlipidemia   . Hypertension    echocardiogram 6/10: Moderate LVH, EF 55-65%, mild, mild MR, MAC, mild LAE, PASP 32  . Myocardial infarction ([place])   . Paroxysmal atrial fibrillation (HCC)   . Prediabetes 10/08/2015  . Preoperative cardiovascular examination 10/09/2015  . Shortness of breath dyspnea   . Stroke [facility]) 10/2011   Past Surgical History:  Procedure Laterality Date  . CARDIAC CATHETERIZATION    . ENDARTERECTOMY Right 10/10/2015   Procedure: ENDARTERECTOMY CAROTID-RIGHT;  Surgeon: Serafina Mitchell, MD;  Location: [facility];  Service: Vascular;  Laterality: Right;    reports that he has never smoked. He has never used smokeless tobacco. He reports that he does not drink alcohol or use drugs. family history includes Aneurysm in his sister. No Known Allergies Current Outpatient Prescriptions on File Prior to Visit  Medication Sig Dispense Refill  . aspirin EC 81 MG tablet Take 1 tablet (81 mg total) by mouth daily. 30 tablet 0  . atorvastatin (LIPITOR) 80 MG tablet Take 1 tablet (80 mg total) by mouth daily. 90 tablet 3  . ezetimibe (ZETIA) 10 MG tablet Take 1 tablet (10 mg total) by mouth daily. 30 tablet 0  . isosorbide mononitrate (IMDUR) 30 MG 24 hr tablet Take 1 tablet (30 mg total) by mouth daily. 30 tablet 0  . oxyCODONE (OXY IR/ROXICODONE) 5 MG immediate release tablet Take 1-2 tablets (5-10 mg total) by mouth every 6 (six) hours as needed for moderate pain. 30 tablet 0  . pantoprazole (PROTONIX) 40 MG tablet Take 1 tablet (40 mg total) by mouth daily. 30 tablet 0  . senna-docusate (SENOKOT-S) 8.6-50 MG tablet Take 1 tablet by mouth at bedtime as needed for mild constipation. 15 tablet 0  . tamsulosin (FLOMAX) 0.4 MG CAPS capsule Take 1 capsule (0.4 mg total) by mouth daily after supper. 30 capsule 0  . traZODone (DESYREL) 50 MG tablet Take 0.5-1 tablets (25-50 mg total) by mouth at bedtime as needed for sleep. [place]  tablet 5   No current facility-administered medications on file prior to visit.    Review of Systems  Constitutional: Negative for other unusual diaphoresis or sweats HENT: Negative for ear discharge or swelling Eyes: Negative for other worsening visual disturbances Respiratory: Negative for stridor or other swelling  Gastrointestinal: Negative for worsening distension or other blood Genitourinary: Negative for retention or other urinary change Musculoskeletal: Negative for other MSK pain or swelling Skin: Negative for color change or other new lesions Neurological: Negative for worsening  tremors and other numbness  Psychiatric/Behavioral: Negative for worsening agitation or other fatigue All other system neg per pt    Objective:   Physical Exam BP (!) 136/92   Pulse 84   Ht 5' 6.5" (1.689 m)   Wt 168 lb (76.2 kg)   SpO2 99%   BMI 26.71 kg/m  VS noted,  Constitutional: Pt appears in NAD HENT: Head: NCAT.  Right Ear: External ear normal.  Left Ear: External ear normal.  Eyes: . Pupils are equal, round, and reactive to light. Conjunctivae and EOM are normal Nose: without d/c or deformity Neck: Neck supple. Gross normal ROM Cardiovascular: Normal rate and regular rhythm.   Pulmonary/Chest: Effort normal and breath sounds decreased  without overt rales or wheezing.  Neurological: Pt is alert. At baseline orientation, motor left sided deficit no change Skin: Skin is warm. No rashes, other new lesions, no LE edema Psychiatric: Pt behavior is normal without agitation  No other exam findings    Assessment & Plan:

## 2016-12-30 NOTE — Patient Instructions (Addendum)
Please take all new medication as prescribed - the coreg was increased to 6.25 mg twice per day  OK to stop the amlodipine for blood pressure  OK to please restart your medications, with the most important today being the Inhaler, Eliquis for blood thinner, and lasix for fluid  Please continue all other medications as before as you can afford them  Please have the pharmacy call with any other refills you may need.  Please keep your appointments with your specialists as you may have planned  Please go to the XRAY Department in the Basement (go straight as you get off the elevator) for the x-ray testing - tomorrow  Please go to the LAB in the Basement (turn left off the elevator) for the tests to be done tomorrow  You will be contacted by phone if any changes need to be made immediately.  Otherwise, you will receive a letter about your results with an explanation, but please check with MyChart first.  Please remember to sign up for MyChart if you have not done so, as this will be important to you in the future with finding out test results, communicating by private email, and scheduling acute appointments online when needed.  Please return in 2 weeks, or sooner if needed

## 2016-12-30 NOTE — Assessment & Plan Note (Signed)
D/w pt , very important to reduce risk of stroke in light of Afib, but if not able to comply due to cost, he may need to consider coumadin

## 2016-12-30 NOTE — Assessment & Plan Note (Signed)
Etiology unclear and exam is nonspecific but have high suspicion for at least mild volume increase due to noncompliance with medication resulting in at least early pulmonary edema likely cause if his resp predicament; sats ok at this time and no overt rales or wheezing, will simply ask him to restart home meds asap with focus on proventil inhaler, eliquis, coreg and lasix; has lower BP and near poly pharmacy so will d/c amlodipine 10 mg, and increase coreg to 6.26 bid.  To take all other meds no change, but not clear that he will due to cost.  Also for cxr, labs tomorrow as pt declines to go to ED and lab closed at this time.  Doubt PE but also for d dimer as cannot be completly ruled out with not taking eiliquis

## 2016-12-30 NOTE — Assessment & Plan Note (Signed)
[place] for d/c amlodipine 10 mg and increased coreg 6.25 bid,  to f/u any worsening symptoms or concerns

## 2016-12-31 ENCOUNTER — Ambulatory Visit (INDEPENDENT_AMBULATORY_CARE_PROVIDER_SITE_OTHER)
Admission: RE | Admit: 2016-12-31 | Discharge: 2016-12-31 | Disposition: A | Discharge status: Home / Self Care | Payer: Medicare Other | Source: Ambulatory Visit | Attending: Internal Medicine | Admitting: Internal Medicine

## 2016-12-31 ENCOUNTER — Other Ambulatory Visit (INDEPENDENT_AMBULATORY_CARE_PROVIDER_SITE_OTHER): Payer: Medicare Other

## 2016-12-31 DIAGNOSIS — R0609 Other forms of dyspnea: Secondary | ICD-10-CM

## 2016-12-31 DIAGNOSIS — R0600 Dyspnea, unspecified: Secondary | ICD-10-CM | POA: Diagnosis not present

## 2016-12-31 LAB — CBC WITH DIFFERENTIAL/PLATELET
BASOS ABS: 0 10*3/uL (ref 0.0–0.1)
Basophils Relative: 0.5 % (ref 0.0–3.0)
Eosinophils Absolute: 0 10*3/uL (ref 0.0–0.7)
Eosinophils Relative: 0.7 % (ref 0.0–5.0)
HEMATOCRIT: 37 % — AB (ref 39.0–52.0)
HEMOGLOBIN: 11.6 g/dL — AB (ref 13.0–17.0)
LYMPHS PCT: 30.1 % (ref 12.0–46.0)
Lymphs Abs: 1.4 10*3/uL (ref 0.7–4.0)
MCHC: 31.4 g/dL (ref 30.0–36.0)
Monocytes Absolute: 0.4 10*3/uL (ref 0.1–1.0)
Monocytes Relative: 8.4 % (ref 3.0–12.0)
Neutro Abs: 2.7 10*3/uL (ref 1.4–7.7)
Neutrophils Relative %: 60.3 % (ref 43.0–77.0)
Platelets: 165 10*3/uL (ref 150.0–400.0)
RBC: 5.35 Mil/uL (ref 4.22–5.81)
RDW: 16 % — ABNORMAL HIGH (ref 11.5–15.5)
WBC: 4.5 10*3/uL (ref 4.0–10.5)

## 2016-12-31 LAB — BASIC METABOLIC PANEL
BUN: 16 mg/dL (ref 6–23)
CHLORIDE: 108 meq/L (ref 96–112)
CO2: 27 meq/L (ref 19–32)
Calcium: 8.7 mg/dL (ref 8.4–10.5)
Creatinine, Ser: 1.21 mg/dL (ref 0.40–1.50)
GFR: 75.19 mL/min (ref >60.00)
GLUCOSE: 184 mg/dL — AB (ref 70–99)
POTASSIUM: 3.7 meq/L (ref 3.5–5.1)
SODIUM: 140 meq/L (ref 135–145)

## 2016-12-31 LAB — URINALYSIS, ROUTINE W REFLEX MICROSCOPIC
Ketones, ur: NEGATIVE
LEUKOCYTES UA: NEGATIVE
NITRITE: NEGATIVE
Specific Gravity, Urine: 1.015 (ref 1.000–1.030)
TOTAL PROTEIN, URINE-UPE24: 30 — AB
Urine Glucose: NEGATIVE
Urobilinogen, UA: 4 — AB (ref 0.0–1.0)
pH: 6.5 (ref 5.0–8.0)

## 2016-12-31 LAB — HEPATIC FUNCTION PANEL
ALBUMIN: 3.6 g/dL (ref 3.5–5.2)
ALT: 31 U/L (ref 0–53)
AST: 28 U/L (ref 0–37)
Alkaline Phosphatase: 53 U/L (ref 39–117)
Bilirubin, Direct: 0.2 mg/dL (ref 0.0–0.3)
Total Bilirubin: 0.6 mg/dL (ref 0.2–1.2)
Total Protein: 7.2 g/dL (ref 6.0–8.3)

## 2017-01-01 ENCOUNTER — Ambulatory Visit (INDEPENDENT_AMBULATORY_CARE_PROVIDER_SITE_OTHER)
Admission: RE | Admit: 2017-01-01 | Discharge: 2017-01-01 | Disposition: A | Discharge status: Home / Self Care | Payer: Medicare Other | Source: Ambulatory Visit | Attending: Internal Medicine | Admitting: Internal Medicine

## 2017-01-01 ENCOUNTER — Telehealth: Payer: Self-pay | Admitting: Internal Medicine

## 2017-01-01 ENCOUNTER — Telehealth: Payer: Self-pay

## 2017-01-01 ENCOUNTER — Encounter: Payer: Self-pay | Admitting: Internal Medicine

## 2017-01-01 DIAGNOSIS — R7989 Other specified abnormal findings of blood chemistry: Secondary | ICD-10-CM

## 2017-01-01 DIAGNOSIS — R0609 Other forms of dyspnea: Principal | ICD-10-CM

## 2017-01-01 DIAGNOSIS — Z91199 Patient's noncompliance with other medical treatment and regimen due to unspecified reason: Secondary | ICD-10-CM

## 2017-01-01 DIAGNOSIS — Z9119 Patient's noncompliance with other medical treatment and regimen: Secondary | ICD-10-CM

## 2017-01-01 DIAGNOSIS — R0602 Shortness of breath: Secondary | ICD-10-CM | POA: Diagnosis not present

## 2017-01-01 LAB — D-DIMER, QUANTITATIVE: D-Dimer, Quant: 4.01 mcg/mL FEU — ABNORMAL HIGH (ref <0.50)

## 2017-01-01 MED ORDER — IOPAMIDOL (ISOVUE-370) INJECTION 76%
100.0000 mL | Freq: Once | INTRAVENOUS | Status: DC | PRN
Start: 2017-01-01 — End: 2017-01-02

## 2017-01-01 NOTE — Telephone Encounter (Signed)
error 

## 2017-01-01 NOTE — Telephone Encounter (Signed)
Spoke with [name] and gave her the results and she stated that she would inform the patient.

## 2017-01-01 NOTE — Telephone Encounter (Signed)
Pt called back returning your call. You can reach him at [phone].

## 2017-01-01 NOTE — Telephone Encounter (Signed)
Called pt, LVM.   

## 2017-01-01 NOTE — Telephone Encounter (Signed)
-----   Message from Biagio Borg, MD sent at 01/01/2017 12:25 PM EDT ----- I will send letter, but ok to call pt or pt family -   CT scan does not show blood clots (that's good), but does show mild heart failure  Please ask pt to "double up" on his lasix by taking 20 mg twice per day for 5 days, then back to one in the AM  I understand he has very limited funds, but REALLY needs to take the lasix as prescribed so this does not get worse and cause him to be in the hospital with heart failure

## 2017-01-01 NOTE — Telephone Encounter (Signed)
Called pt, left a detailed msg stating the below information.

## 2017-01-01 NOTE — Telephone Encounter (Signed)
-----   Message from Biagio Borg, MD sent at 01/01/2017  8:25 AM EDT ----- Left message on MyChart, pt to cont same tx except  The test results show that your current treatment is OK, except the D Dimer test is elevated.  This may not be serious, but sometimes can indicate the presence of possible blood clots.  We will ask you to get a CT scan of the Chest asap.  You should hear from the office about this shortly.    Nelta Caudill to please inform pt, I will do order - Ct scan stat

## 2017-01-01 NOTE — Telephone Encounter (Signed)
I spoke with his friend [name] [phone] that he was with. She then voiced how she doesn't believe that we are doing what needs to be done to help Mr. Higham as far as his speech, walking ability and leg. I informed her that we are aware of his condition and have taken the necessary steps to help him but she doesn't believe that we are doing everything that we can. I informed her that I was calling about his lab results and stressed how important it was that I discuss those with her at this time. I informed her of lab results and she expressed understanding and said that she will be able to take him to have the CT scan done today at 10:30. I gave her the address to the [place] facility as well as the phone number.

## 2017-01-05 ENCOUNTER — Telehealth: Payer: Self-pay | Admitting: Internal Medicine

## 2017-01-05 NOTE — Telephone Encounter (Signed)
Umm..i dont believe so. I spoke with him and his friend [name] about his lab results but not any medications.

## 2017-01-05 NOTE — Telephone Encounter (Signed)
Patient states he was to call back to review his medications with you?

## 2017-01-06 NOTE — Telephone Encounter (Signed)
Spoke with patient.  Told him that meds did not need to be review b/c they are reviewed at every OV. Informed him that he does have OV tomorrow 6/7.

## 2017-01-07 ENCOUNTER — Ambulatory Visit: Payer: Medicare Other | Admitting: Internal Medicine

## 2017-01-13 ENCOUNTER — Ambulatory Visit (INDEPENDENT_AMBULATORY_CARE_PROVIDER_SITE_OTHER): Payer: Medicare Other | Admitting: Internal Medicine

## 2017-01-13 ENCOUNTER — Encounter: Payer: Self-pay | Admitting: Internal Medicine

## 2017-01-13 DIAGNOSIS — N183 Chronic kidney disease, stage 3 unspecified: Secondary | ICD-10-CM

## 2017-01-13 DIAGNOSIS — I10 Essential (primary) hypertension: Secondary | ICD-10-CM | POA: Diagnosis not present

## 2017-01-13 DIAGNOSIS — I5020 Unspecified systolic (congestive) heart failure: Secondary | ICD-10-CM

## 2017-01-13 DIAGNOSIS — I509 Heart failure, unspecified: Secondary | ICD-10-CM | POA: Insufficient documentation

## 2017-01-13 HISTORY — DX: Unspecified systolic (congestive) heart failure: I50.20

## 2017-01-13 MED ORDER — PANTOPRAZOLE SODIUM 40 MG PO TBEC: 40.0000 mg | DELAYED_RELEASE_TABLET | Freq: Every day | ORAL | 3 refills | Status: DC

## 2017-01-13 MED ORDER — FUROSEMIDE 20 MG PO TABS: 20.0000 mg | ORAL_TABLET | Freq: Every day | ORAL | 3 refills | Status: DC

## 2017-01-13 MED ORDER — ISOSORBIDE MONONITRATE ER 30 MG PO TB24: 30.0000 mg | ORAL_TABLET | Freq: Every day | ORAL | 3 refills | Status: DC

## 2017-01-13 MED ORDER — CARVEDILOL 12.5 MG PO TABS: 12.5000 mg | ORAL_TABLET | Freq: Two times a day (BID) | ORAL | 3 refills | Status: DC

## 2017-01-13 MED ORDER — ALBUTEROL SULFATE HFA 108 (90 BASE) MCG/ACT IN AERS: 2.0000 | INHALATION_SPRAY | Freq: Four times a day (QID) | RESPIRATORY_TRACT | 3 refills | Status: DC | PRN

## 2017-01-13 MED ORDER — TAMSULOSIN HCL 0.4 MG PO CAPS: 0.4000 mg | ORAL_CAPSULE | Freq: Every day | ORAL | 3 refills | Status: DC

## 2017-01-13 MED ORDER — ATORVASTATIN CALCIUM 80 MG PO TABS: 80.0000 mg | ORAL_TABLET | Freq: Every day | ORAL | 3 refills | Status: DC

## 2017-01-13 NOTE — Assessment & Plan Note (Signed)
Recent echo with only mild lower EF and no diastolic dysfxn, but clearly has pulm edema by most recent CTA chest (no PE however); wt is up several lbs and still doe  - ok to increase the lasix to 20 bid, and coreg to 12.5 bid, and f/u 2 wks; encouraged to check the daily wts but I am not sure he understood

## 2017-01-13 NOTE — Assessment & Plan Note (Signed)
Mild elevation, ok for med changes as above, f/u 2 wks

## 2017-01-13 NOTE — Patient Instructions (Addendum)
Ok to increase the lasix (furosemide) to twice per day  OK to increase the carvedilol to 12.5 mg twice per day  Please continue all other medications as before, and refills have been done if requested.  Please have the pharmacy call with any other refills you may need.  Please keep your appointments with your specialists as you may have planned  Please return in 2 weeks, or sooner if needed

## 2017-01-13 NOTE — Progress Notes (Signed)
Subjective:    Patient ID: [name], male    DOB: 04-26-1941, 76 y.o.   MRN: [id number]  HPI  Here to f/u, is poor historian -  difficult s/p stroke;  Is now back to taking several of his meds;  Not clear if he is taking the lasix 20 qd or bid, as he is not sure himself. Did bring current med bottles and is taking lasix, coreg 6.25, and eliquis 5 only.  Wt is up several lbs, he thinks due to improved diet Wt Readings from Last 3 Encounters:  01/13/17 167 lb (75.8 kg)  12/30/16 168 lb (76.2 kg)  12/23/16 163 lb (73.9 kg)   BP Readings from Last 3 Encounters:  01/13/17 (!) 146/100  12/30/16 (!) 136/92  12/23/16 (!) 168/90  Pt denies chest pain, increased sob or doe but still some dyspnea with speaking/talking though he really cant say if can ambulation improved, and has no wheezing, orthopnea, PND, increased LE swelling, palpitations, dizziness or syncope.  Asks for Albuterol MDI prn refill as this helps as well.  No fever or prod cough. . Past Medical History:  Diagnosis Date  . Anemia, iron deficiency 08/15/2011  . CAD (coronary artery disease)    cath in 1/08: EF 60%, mild dilated Ao root, oD2 30%, LAD 20-30%.  . Dilated aortic root ([place]) 08/15/2011  . Dyslipidemia   . Dysrhythmia   . History of MI (myocardial infarction) 08/15/2011  . History of pneumonia 08/15/2011  . History of stroke   . Hyperlipidemia   . Hypertension    echocardiogram 6/10: Moderate LVH, EF 55-65%, mild, mild MR, MAC, mild LAE, PASP 32  . Myocardial infarction ([place])   . Paroxysmal atrial fibrillation (HCC)   . Prediabetes 10/08/2015  . Preoperative cardiovascular examination 10/09/2015  . Shortness of breath dyspnea   . Stroke ([place]) 10/2011  . 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 CARDIAC CATHETERIZATION    . ENDARTERECTOMY Right 10/10/2015   Procedure: ENDARTERECTOMY CAROTID-RIGHT;  Surgeon: Serafina Mitchell, MD;  Location: [facility];   Service: Vascular;  Laterality: Right;    reports that he has never smoked. He has never used smokeless tobacco. He reports that he does not drink alcohol or use drugs. family history includes Aneurysm in his sister. No Known Allergies Current Outpatient Prescriptions on File Prior to Visit  Medication Sig Dispense Refill  . apixaban (ELIQUIS) 5 MG TABS tablet Take 1 tablet (5 mg total) by mouth 2 (two) times daily. 60 tablet 11  . aspirin EC 81 MG tablet Take 1 tablet (81 mg total) by mouth daily. 30 tablet 0  . ezetimibe (ZETIA) 10 MG tablet Take 1 tablet (10 mg total) by mouth daily. 30 tablet 0  . senna-docusate (SENOKOT-S) 8.6-50 MG tablet Take 1 tablet by mouth at bedtime as needed for mild constipation. 15 tablet 0   No current facility-administered medications on file prior to visit.    Review of Systems  Constitutional: Negative for other unusual diaphoresis or sweats HENT: Negative for ear discharge or swelling Eyes: Negative for other worsening visual disturbances Respiratory: Negative for stridor or other swelling  Gastrointestinal: Negative for worsening distension or other blood Genitourinary: Negative for retention or other urinary change Musculoskeletal: Negative for other MSK pain or swelling Skin: Negative for color change or other new lesions Neurological: Negative for worsening tremors and other numbness  Psychiatric/Behavioral: Negative for worsening agitation or other fatigue  All other system neg per pt    Objective:   Physical Exam BP (!) 146/100 (BP Location: Left Arm, Patient Position: Sitting, Cuff Size: Large)   Pulse 74   Temp 97.9 F (36.6 C) (Oral)   Resp 16   Ht 5' 6.5" (1.689 m)   Wt 167 lb (75.8 kg)   SpO2 91%   BMI 26.55 kg/m  VS noted,  Constitutional: Pt appears in NAD HENT: Head: NCAT.  Right Ear: External ear normal.  Left Ear: External ear normal.  Eyes: . Pupils are equal, round, and reactive to light. Conjunctivae and EOM are  normal Nose: without d/c or deformity Neck: Neck supple. Gross normal ROM Cardiovascular: Normal rate and regular rhythm.   Pulmonary/Chest: Effort normal and breath sounds decreased without rales or wheezing.  Abd:  Soft, NT, ND, + BS, no organomegaly Neurological: Pt is alert. At baseline orientation, motor grossly intact Skin: Skin is warm. No rashes, other new lesions, no LE edema Psychiatric: Pt behavior is normal without agitation  No other exam findings  Lab Results  Component Value Date   WBC 4.5 12/31/2016   HGB 11.6 (L) 12/31/2016   HCT 37.0 (L) 12/31/2016   PLT 165.0 12/31/2016   GLUCOSE 184 (H) 12/31/2016   CHOL 121 12/10/2015   TRIG 42.0 12/10/2015   HDL 48.80 12/10/2015   LDLDIRECT 230.0 01/20/2013   LDLCALC 64 12/10/2015   ALT 31 12/31/2016   AST 28 12/31/2016   NA 140 12/31/2016   K 3.7 12/31/2016   CL 108 12/31/2016   CREATININE 1.21 12/31/2016   BUN 16 12/31/2016   CO2 27 12/31/2016   TSH 1.59 12/10/2015   PSA 1.36 12/10/2015   INR 1.23 11/05/2015   HGBA1C 6.2 (H) 10/07/2015         Assessment & Plan:

## 2017-01-13 NOTE — Assessment & Plan Note (Addendum)
Will need f/u BMP next visit on higher dose lasix,  to f/u any worsening symptoms or concerns

## 2017-01-20 ENCOUNTER — Ambulatory Visit: Payer: Medicare Other | Admitting: Family Medicine

## 2017-01-28 ENCOUNTER — Ambulatory Visit (INDEPENDENT_AMBULATORY_CARE_PROVIDER_SITE_OTHER): Payer: Medicare Other | Admitting: Internal Medicine

## 2017-01-28 ENCOUNTER — Encounter: Payer: Self-pay | Admitting: Internal Medicine

## 2017-01-28 ENCOUNTER — Other Ambulatory Visit (INDEPENDENT_AMBULATORY_CARE_PROVIDER_SITE_OTHER): Payer: Medicare Other

## 2017-01-28 VITALS — BP 144/90 | HR 78 | Ht 66.5 in | Wt 167.0 lb

## 2017-01-28 DIAGNOSIS — I5020 Unspecified systolic (congestive) heart failure: Secondary | ICD-10-CM | POA: Diagnosis not present

## 2017-01-28 DIAGNOSIS — N529 Male erectile dysfunction, unspecified: Secondary | ICD-10-CM | POA: Diagnosis not present

## 2017-01-28 DIAGNOSIS — R269 Unspecified abnormalities of gait and mobility: Secondary | ICD-10-CM | POA: Insufficient documentation

## 2017-01-28 DIAGNOSIS — I10 Essential (primary) hypertension: Secondary | ICD-10-CM | POA: Diagnosis not present

## 2017-01-28 LAB — BASIC METABOLIC PANEL
BUN: 13 mg/dL (ref 6–23)
CALCIUM: 9 mg/dL (ref 8.4–10.5)
CO2: 28 mEq/L (ref 19–32)
Chloride: 103 mEq/L (ref 96–112)
Creatinine, Ser: 1.23 mg/dL (ref 0.40–1.50)
GFR: 73.76 mL/min (ref >60.00)
GLUCOSE: 95 mg/dL (ref 70–99)
POTASSIUM: 3.5 meq/L (ref 3.5–5.1)
SODIUM: 137 meq/L (ref 135–145)

## 2017-01-28 LAB — BRAIN NATRIURETIC PEPTIDE: Pro B Natriuretic peptide (BNP): 1033 pg/mL — ABNORMAL HIGH (ref 0.0–100.0)

## 2017-01-28 NOTE — Patient Instructions (Signed)
You will be contacted regarding the referral for: Physical Therapy  Please continue all other medications as before, and refills have been done if requested.  Please have the pharmacy call with any other refills you may need.  Please continue your efforts at being more active, low cholesterol diet, and weight control.  Please keep your appointments with your specialists as you may have planned  Please go to the LAB in the Basement (turn left off the elevator) for the tests to be done today  You will be contacted by phone if any changes need to be made immediately.  Otherwise, you will receive a letter about your results with an explanation, but please check with MyChart first.  Please remember to sign up for MyChart if you have not done so, as this will be important to you in the future with finding out test results, communicating by private email, and scheduling acute appointments online when needed.  Please return in 3 months, or sooner if needed

## 2017-01-28 NOTE — Progress Notes (Signed)
Subjective:    Patient ID: [name], male    DOB: 06/14/1941, 76 y.o.   MRN: [id number]  HPI    Here to f/u; overall doing ok,  Pt denies chest pain, increasing sob or doe, wheezing, orthopnea, PND, increased LE swelling, palpitations, dizziness or syncope.  Pt denies new neurological symptoms such as new headache, or facial or extremity weakness or numbness.  Pt denies polydipsia, polyuria, or low sugar episode.  Pt states overall at least fair compliance with meds.  Still with significant gait disorder but no falls.  Also asks for urology referral due to worsening ED symptoms over last 6 mo.   Wt Readings from Last 3 Encounters:  01/28/17 167 lb (75.8 kg)  01/13/17 167 lb (75.8 kg)  12/30/16 168 lb (76.2 kg)   BP Readings from Last 3 Encounters:  01/28/17 (!) 144/90  01/13/17 (!) 146/100  12/30/16 (!) 136/92   Past Medical History:  Diagnosis Date  . Anemia, iron deficiency 08/15/2011  . CAD (coronary artery disease)    cath in 1/08: EF 60%, mild dilated Ao root, oD2 30%, LAD 20-30%.  . Dilated aortic root ([place]) 08/15/2011  . Dyslipidemia   . Dysrhythmia   . History of MI (myocardial infarction) 08/15/2011  . History of pneumonia 08/15/2011  . History of stroke   . Hyperlipidemia   . Hypertension    echocardiogram 6/10: Moderate LVH, EF 55-65%, mild, mild MR, MAC, mild LAE, PASP 32  . Myocardial infarction ([place])   . Paroxysmal atrial fibrillation (HCC)   . Prediabetes 10/08/2015  . Preoperative cardiovascular examination 10/09/2015  . Shortness of breath dyspnea   . Stroke ([place]) 10/2011  . 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 CARDIAC CATHETERIZATION    . ENDARTERECTOMY Right 10/10/2015   Procedure: ENDARTERECTOMY CAROTID-RIGHT;  Surgeon: Serafina Mitchell, MD;  Location: [facility];  Service: Vascular;  Laterality: Right;    reports that he has never smoked. He has never used smokeless tobacco. He reports that he  does not drink alcohol or use drugs. family history includes Aneurysm in his sister. No Known Allergies Current Outpatient Prescriptions on File Prior to Visit  Medication Sig Dispense Refill  . albuterol (PROVENTIL HFA;VENTOLIN HFA) 108 (90 Base) MCG/ACT inhaler Inhale 2 puffs into the lungs every 6 (six) hours as needed for wheezing or shortness of breath. 3 Inhaler 3  . apixaban (ELIQUIS) 5 MG TABS tablet Take 1 tablet (5 mg total) by mouth 2 (two) times daily. 60 tablet 11  . aspirin EC 81 MG tablet Take 1 tablet (81 mg total) by mouth daily. 30 tablet 0  . atorvastatin (LIPITOR) 80 MG tablet Take 1 tablet (80 mg total) by mouth daily. 90 tablet 3  . carvedilol (COREG) 12.5 MG tablet Take 1 tablet (12.5 mg total) by mouth 2 (two) times daily with a meal. 180 tablet 3  . ezetimibe (ZETIA) 10 MG tablet Take 1 tablet (10 mg total) by mouth daily. 30 tablet 0  . furosemide (LASIX) 20 MG tablet Take 1 tablet (20 mg total) by mouth daily. 180 tablet 3  . isosorbide mononitrate (IMDUR) 30 MG 24 hr tablet Take 1 tablet (30 mg total) by mouth daily. 90 tablet 3  . pantoprazole (PROTONIX) 40 MG tablet Take 1 tablet (40 mg total) by mouth daily. 90 tablet 3  . senna-docusate (SENOKOT-S) 8.6-50 MG tablet Take 1 tablet by mouth at bedtime as needed  for mild constipation. 15 tablet 0  . tamsulosin (FLOMAX) 0.4 MG CAPS capsule Take 1 capsule (0.4 mg total) by mouth daily after supper. 90 capsule 3   No current facility-administered medications on file prior to visit.    Review of Systems  Constitutional: Negative for other unusual diaphoresis or sweats HENT: Negative for ear discharge or swelling Eyes: Negative for other worsening visual disturbances Respiratory: Negative for stridor or other swelling  Gastrointestinal: Negative for worsening distension or other blood Genitourinary: Negative for retention or other urinary change Musculoskeletal: Negative for other MSK pain or swelling Skin: Negative  for color change or other new lesions Neurological: Negative for worsening tremors and other numbness  Psychiatric/Behavioral: Negative for worsening agitation or other fatigue All other system neg per pt    Objective:   Physical Exam BP (!) 144/90   Pulse 78   Ht 5' 6.5" (1.689 m)   Wt 167 lb (75.8 kg)   SpO2 100%   BMI 26.55 kg/m  VS noted,  Constitutional: Pt appears in NAD HENT: Head: NCAT.  Right Ear: External ear normal.  Left Ear: External ear normal.  Eyes: . Pupils are equal, round, and reactive to light. Conjunctivae and EOM are normal Nose: without d/c or deformity Neck: Neck supple. Gross normal ROM Cardiovascular: Normal rate and regular rhythm.   Pulmonary/Chest: Effort normal and breath sounds decreased without rales or wheezing.  Neurological: Pt is alert. At baseline orientation, motor grossly intact Skin: Skin is warm. No rashes, other new lesions, no LE edema Psychiatric: Pt behavior is normal without agitation  No other exam findings  Lab Results  Component Value Date   WBC 4.5 12/31/2016   HGB 11.6 (L) 12/31/2016   HCT 37.0 (L) 12/31/2016   PLT 165.0 12/31/2016   GLUCOSE 184 (H) 12/31/2016   CHOL 121 12/10/2015   TRIG 42.0 12/10/2015   HDL 48.80 12/10/2015   LDLDIRECT 230.0 01/20/2013   LDLCALC 64 12/10/2015   ALT 31 12/31/2016   AST 28 12/31/2016   NA 140 12/31/2016   K 3.7 12/31/2016   CL 108 12/31/2016   CREATININE 1.21 12/31/2016   BUN 16 12/31/2016   CO2 27 12/31/2016   TSH 1.59 12/10/2015   PSA 1.36 12/10/2015   INR 1.23 11/05/2015   HGBA1C 6.2 (H) 10/07/2015       Assessment & Plan:

## 2017-01-31 NOTE — Assessment & Plan Note (Signed)
[place] for urology referral as requested

## 2017-01-31 NOTE — Assessment & Plan Note (Signed)
stable overall by history and exam, and pt to continue medical treatment as before,  to f/u any worsening symptoms or concerns 

## 2017-01-31 NOTE — Assessment & Plan Note (Signed)
Mild possible worsening with general weakness , increased risk for fall, for PT eval

## 2017-01-31 NOTE — Assessment & Plan Note (Signed)
Mild persistently elevated, declines any change in med tx for now,  to f/u any worsening symptoms or concerns

## 2017-02-01 ENCOUNTER — Other Ambulatory Visit (HOSPITAL_COMMUNITY): Payer: Self-pay | Admitting: Interventional Radiology

## 2017-02-01 DIAGNOSIS — I771 Stricture of artery: Secondary | ICD-10-CM

## 2017-02-11 ENCOUNTER — Ambulatory Visit (HOSPITAL_COMMUNITY)
Admission: RE | Admit: 2017-02-11 | Discharge: 2017-02-11 | Disposition: A | Discharge status: Home / Self Care | Payer: Medicare Other | Source: Ambulatory Visit | Attending: Interventional Radiology | Admitting: Interventional Radiology

## 2017-02-11 DIAGNOSIS — I771 Stricture of artery: Secondary | ICD-10-CM

## 2017-02-11 HISTORY — PX: IR RADIOLOGIST EVAL & MGMT: IMG5224

## 2017-03-05 ENCOUNTER — Encounter (HOSPITAL_COMMUNITY): Payer: Self-pay | Admitting: Interventional Radiology

## 2017-03-16 ENCOUNTER — Other Ambulatory Visit (HOSPITAL_COMMUNITY): Payer: Self-pay | Admitting: Interventional Radiology

## 2017-03-16 ENCOUNTER — Ambulatory Visit (HOSPITAL_COMMUNITY): Admission: RE | Admit: 2017-03-16 | Payer: Medicare Other | Source: Ambulatory Visit

## 2017-03-16 DIAGNOSIS — I771 Stricture of artery: Secondary | ICD-10-CM

## 2017-03-24 ENCOUNTER — Ambulatory Visit (HOSPITAL_COMMUNITY)
Admission: RE | Admit: 2017-03-24 | Discharge: 2017-03-24 | Disposition: A | Discharge status: Home / Self Care | Payer: Medicare Other | Source: Ambulatory Visit | Attending: Interventional Radiology | Admitting: Interventional Radiology

## 2017-03-24 DIAGNOSIS — R410 Disorientation, unspecified: Secondary | ICD-10-CM | POA: Diagnosis not present

## 2017-03-24 DIAGNOSIS — I771 Stricture of artery: Secondary | ICD-10-CM

## 2017-03-24 DIAGNOSIS — M79605 Pain in left leg: Secondary | ICD-10-CM | POA: Diagnosis not present

## 2017-03-24 HISTORY — PX: IR RADIOLOGIST EVAL & MGMT: IMG5224

## 2017-03-25 ENCOUNTER — Encounter (HOSPITAL_COMMUNITY): Payer: Self-pay | Admitting: Interventional Radiology

## 2017-03-26 ENCOUNTER — Encounter: Payer: Self-pay | Admitting: Internal Medicine

## 2017-03-29 ENCOUNTER — Encounter (HOSPITAL_COMMUNITY): Payer: Self-pay

## 2017-04-03 ENCOUNTER — Inpatient Hospital Stay (HOSPITAL_COMMUNITY): Payer: Medicare Other

## 2017-04-03 ENCOUNTER — Emergency Department (HOSPITAL_COMMUNITY): Payer: Medicare Other

## 2017-04-03 ENCOUNTER — Encounter (HOSPITAL_COMMUNITY): Payer: Self-pay | Admitting: Emergency Medicine

## 2017-04-03 ENCOUNTER — Inpatient Hospital Stay (HOSPITAL_COMMUNITY)
Admission: EM | Admit: 2017-04-03 | Discharge: 2017-04-06 | DRG: 291 | Disposition: A | Discharge status: Home / Self Care | Payer: Medicare Other | Attending: Nephrology | Admitting: Nephrology

## 2017-04-03 DIAGNOSIS — I509 Heart failure, unspecified: Secondary | ICD-10-CM

## 2017-04-03 DIAGNOSIS — I110 Hypertensive heart disease with heart failure: Secondary | ICD-10-CM | POA: Diagnosis not present

## 2017-04-03 DIAGNOSIS — I517 Cardiomegaly: Secondary | ICD-10-CM | POA: Diagnosis present

## 2017-04-03 DIAGNOSIS — N183 Chronic kidney disease, stage 3 unspecified: Secondary | ICD-10-CM | POA: Diagnosis present

## 2017-04-03 DIAGNOSIS — R7989 Other specified abnormal findings of blood chemistry: Secondary | ICD-10-CM | POA: Diagnosis not present

## 2017-04-03 DIAGNOSIS — I480 Paroxysmal atrial fibrillation: Secondary | ICD-10-CM | POA: Diagnosis present

## 2017-04-03 DIAGNOSIS — I130 Hypertensive heart and chronic kidney disease with heart failure and stage 1 through stage 4 chronic kidney disease, or unspecified chronic kidney disease: Secondary | ICD-10-CM | POA: Diagnosis not present

## 2017-04-03 DIAGNOSIS — Z7901 Long term (current) use of anticoagulants: Secondary | ICD-10-CM | POA: Diagnosis not present

## 2017-04-03 DIAGNOSIS — G8194 Hemiplegia, unspecified affecting left nondominant side: Secondary | ICD-10-CM | POA: Diagnosis not present

## 2017-04-03 DIAGNOSIS — Z7982 Long term (current) use of aspirin: Secondary | ICD-10-CM

## 2017-04-03 DIAGNOSIS — R0602 Shortness of breath: Secondary | ICD-10-CM | POA: Diagnosis not present

## 2017-04-03 DIAGNOSIS — I252 Old myocardial infarction: Secondary | ICD-10-CM | POA: Diagnosis not present

## 2017-04-03 DIAGNOSIS — R079 Chest pain, unspecified: Secondary | ICD-10-CM | POA: Diagnosis not present

## 2017-04-03 DIAGNOSIS — E876 Hypokalemia: Secondary | ICD-10-CM | POA: Diagnosis not present

## 2017-04-03 DIAGNOSIS — I6930 Unspecified sequelae of cerebral infarction: Secondary | ICD-10-CM

## 2017-04-03 DIAGNOSIS — I10 Essential (primary) hypertension: Secondary | ICD-10-CM | POA: Diagnosis present

## 2017-04-03 DIAGNOSIS — I161 Hypertensive emergency: Secondary | ICD-10-CM | POA: Diagnosis present

## 2017-04-03 DIAGNOSIS — I5031 Acute diastolic (congestive) heart failure: Secondary | ICD-10-CM | POA: Diagnosis present

## 2017-04-03 DIAGNOSIS — Z79899 Other long term (current) drug therapy: Secondary | ICD-10-CM | POA: Diagnosis not present

## 2017-04-03 DIAGNOSIS — Z9114 Patient's other noncompliance with medication regimen: Secondary | ICD-10-CM | POA: Diagnosis not present

## 2017-04-03 DIAGNOSIS — I119 Hypertensive heart disease without heart failure: Secondary | ICD-10-CM | POA: Diagnosis present

## 2017-04-03 DIAGNOSIS — R7303 Prediabetes: Secondary | ICD-10-CM | POA: Diagnosis present

## 2017-04-03 DIAGNOSIS — Z8673 Personal history of transient ischemic attack (TIA), and cerebral infarction without residual deficits: Secondary | ICD-10-CM | POA: Diagnosis not present

## 2017-04-03 DIAGNOSIS — I739 Peripheral vascular disease, unspecified: Secondary | ICD-10-CM | POA: Diagnosis present

## 2017-04-03 DIAGNOSIS — I2583 Coronary atherosclerosis due to lipid rich plaque: Secondary | ICD-10-CM | POA: Diagnosis not present

## 2017-04-03 DIAGNOSIS — I160 Hypertensive urgency: Secondary | ICD-10-CM | POA: Diagnosis not present

## 2017-04-03 DIAGNOSIS — I4891 Unspecified atrial fibrillation: Secondary | ICD-10-CM | POA: Diagnosis present

## 2017-04-03 DIAGNOSIS — I5033 Acute on chronic diastolic (congestive) heart failure: Secondary | ICD-10-CM | POA: Diagnosis not present

## 2017-04-03 DIAGNOSIS — I69354 Hemiplegia and hemiparesis following cerebral infarction affecting left non-dominant side: Secondary | ICD-10-CM | POA: Diagnosis not present

## 2017-04-03 DIAGNOSIS — I77810 Thoracic aortic ectasia: Secondary | ICD-10-CM | POA: Diagnosis present

## 2017-04-03 DIAGNOSIS — E785 Hyperlipidemia, unspecified: Secondary | ICD-10-CM | POA: Diagnosis present

## 2017-04-03 DIAGNOSIS — I639 Cerebral infarction, unspecified: Secondary | ICD-10-CM | POA: Diagnosis not present

## 2017-04-03 DIAGNOSIS — R269 Unspecified abnormalities of gait and mobility: Secondary | ICD-10-CM | POA: Diagnosis not present

## 2017-04-03 DIAGNOSIS — I2510 Atherosclerotic heart disease of native coronary artery without angina pectoris: Secondary | ICD-10-CM | POA: Diagnosis not present

## 2017-04-03 LAB — URINALYSIS, ROUTINE W REFLEX MICROSCOPIC
Bilirubin Urine: NEGATIVE
Glucose, UA: NEGATIVE mg/dL
Ketones, ur: NEGATIVE mg/dL
Leukocytes, UA: NEGATIVE
Nitrite: NEGATIVE
Protein, ur: 100 mg/dL — AB
SPECIFIC GRAVITY, URINE: 1.02 (ref 1.005–1.030)
pH: 6 (ref 5.0–8.0)

## 2017-04-03 LAB — CBC
HCT: 35.1 % — ABNORMAL LOW (ref 39.0–52.0)
HEMOGLOBIN: 11.2 g/dL — AB (ref 13.0–17.0)
MCH: 20.9 pg — AB (ref 26.0–34.0)
MCHC: 31.9 g/dL (ref 30.0–36.0)
MCV: 65.4 fL — ABNORMAL LOW (ref 78.0–100.0)
Platelets: 169 10*3/uL (ref 150–400)
RBC: 5.37 MIL/uL (ref 4.22–5.81)
RDW: 17.7 % — ABNORMAL HIGH (ref 11.5–15.5)
WBC: 5.1 10*3/uL (ref 4.0–10.5)

## 2017-04-03 LAB — BASIC METABOLIC PANEL
ANION GAP: 9 (ref 5–15)
BUN: 23 mg/dL — AB (ref 6–20)
CALCIUM: 8.4 mg/dL — AB (ref 8.9–10.3)
CO2: 22 mmol/L (ref 22–32)
CREATININE: 1.49 mg/dL — AB (ref 0.61–1.24)
Chloride: 110 mmol/L (ref 101–111)
GFR calc Af Amer: 51 mL/min — ABNORMAL LOW (ref >60)
GFR calc non Af Amer: 44 mL/min — ABNORMAL LOW (ref >60)
GLUCOSE: 137 mg/dL — AB (ref 65–99)
Potassium: 3.9 mmol/L (ref 3.5–5.1)
Sodium: 141 mmol/L (ref 135–145)

## 2017-04-03 LAB — TROPONIN I: Troponin I: 0.03 ng/mL (ref <0.03)

## 2017-04-03 LAB — URINALYSIS, MICROSCOPIC (REFLEX): WBC UA: NONE SEEN WBC/hpf (ref 0–5)

## 2017-04-03 LAB — BRAIN NATRIURETIC PEPTIDE: B Natriuretic Peptide: 2236.3 pg/mL — ABNORMAL HIGH (ref 0.0–100.0)

## 2017-04-03 LAB — I-STAT TROPONIN, ED: Troponin i, poc: 0.07 ng/mL (ref 0.00–0.08)

## 2017-04-03 MED ORDER — APIXABAN 5 MG PO TABS
5.0000 mg | ORAL_TABLET | Freq: Two times a day (BID) | ORAL | Status: DC
  Administered 2017-04-03 – 2017-04-06 (×6): 5 mg via ORAL
  Filled 2017-04-03 (×7): qty 1

## 2017-04-03 MED ORDER — FUROSEMIDE 20 MG PO TABS
20.0000 mg | ORAL_TABLET | Freq: Once | ORAL | Status: AC
  Administered 2017-04-03: 20 mg via ORAL
  Filled 2017-04-03: qty 1

## 2017-04-03 MED ORDER — ISOSORBIDE MONONITRATE ER 30 MG PO TB24
30.0000 mg | ORAL_TABLET | Freq: Every day | ORAL | Status: DC
  Administered 2017-04-03 – 2017-04-04 (×2): 30 mg via ORAL
  Filled 2017-04-03 (×3): qty 1

## 2017-04-03 MED ORDER — HYDRALAZINE HCL 20 MG/ML IJ SOLN
10.0000 mg | INTRAMUSCULAR | Status: DC | PRN
  Administered 2017-04-04 (×3): 10 mg via INTRAVENOUS
  Filled 2017-04-03 (×4): qty 1

## 2017-04-03 NOTE — ED Notes (Signed)
Spoke to [name] in MRI regarding urgency or patient's MRI ref: currently severely hypertensive with management plan pending MRI. Advised MRI to call this Charge RN directly when table is available so that ED team can transport patient to expedite process.

## 2017-04-03 NOTE — ED Provider Notes (Signed)
[facility] DEPT Provider Note   CSN: [id number] Arrival date & time: 04/03/17  1554     History   Chief Complaint Chief Complaint  Patient presents with  . Chest Pain  . Shortness of Breath    HPI [name] is a 76 y.o. male presenting with acute onset chest pain shortness of breath and diaphoresis with bilateral leg swelling.  Patient states that for the past week he's had worsening leg swelling and subsequent imbalance. Today around 2 PM, he developed central chest pain, shortness of breath and diaphoresis. This has since resolved. Patient's friend encouraged patient to come to the emergency room considering his chest pain and worsening leg swelling. Patient states that he follows with cardiology, and has been taking all his medication as prescribed. Patient denies fever, chills, current chest pain, nausea, vomiting, abdominal pain, urinary symptoms, or abnormal bowel movements. He states his legs have felt strange the past week, but denies pain. Patient states he took all his medications this morning.  On further history, patient states that he cannot afford his medications, so he has not been taking them. The only medicines he's been taking his Coreg, lipitor and Lasix.  HPI  Past Medical History:  Diagnosis Date  . Anemia, iron deficiency 08/15/2011  . CAD (coronary artery disease)    cath in 1/08: EF 60%, mild dilated Ao root, oD2 30%, LAD 20-30%.  . Dilated aortic root ([place]) 08/15/2011  . Dyslipidemia   . Dysrhythmia   . History of MI (myocardial infarction) 08/15/2011  . History of pneumonia 08/15/2011  . History of stroke   . Hyperlipidemia   . Hypertension    echocardiogram 6/10: Moderate LVH, EF 55-65%, mild, mild MR, MAC, mild LAE, PASP 32  . Myocardial infarction ([place])   . Paroxysmal atrial fibrillation (HCC)   . Prediabetes 10/08/2015  . Preoperative cardiovascular examination 10/09/2015  . Shortness of breath dyspnea   . Stroke ([place]) 10/2011  . 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 Gait disorder 01/28/2017  . Systolic congestive heart failure ([place]) 01/13/2017  . Insomnia 12/06/2016  . Left-sided weakness 12/03/2016  . Chronic anticoagulation 12/10/2015  . Sepsis secondary to UTI ([place]) 11/05/2015  . UTI (lower urinary tract infection) 11/05/2015  . CAD in native artery 11/05/2015  . Left hemiplegia ([place]) 11/05/2015  . Sinus tachycardia   . 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 Labile blood pressure   . Right-sided cerebrovascular accident (CVA) ([place])   . Paroxysmal atrial fibrillation (HCC)   . Carotid artery stenosis   . S/P carotid endarterectomy   . Coronary artery disease involving native coronary artery of native heart without angina pectoris   . Acute blood loss anemia   . Near syncope   . Prediabetes 10/08/2015  . Right cavernous carotid stenosis   . Carotid stenosis   . Acute ischemic stroke ([place]) 10/07/2015  . Chest pain 06/19/2015  . DOE (dyspnea on exertion) 06/19/2015  . Hypertensive urgency 06/19/2015  . Bilateral arm pain 06/26/2014  . 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 Impingement syndrome of left shoulder 01/20/2013  . Atrial fibrillation ([place]) 04/18/2012  . Leg pain 01/28/2012  . Dizziness and giddiness 11/19/2011  . Dehydration 11/19/2011  . CKD (chronic kidney disease), stage III 11/19/2011  . HTN (hypertension) 11/19/2011  . Dyslipidemia 11/19/2011  . Erectile dysfunction 08/20/2011  . Nocturia 08/20/2011  .  Encounter for preventative adult health care exam with abnormal findings 08/15/2011  . Anemia, iron deficiency 08/15/2011  . History of pneumonia 08/15/2011  . CAD (coronary artery disease) 08/15/2011  . History of MI (myocardial infarction) 08/15/2011  . Dilated aortic root ([place]) 08/15/2011  . Syncope 07/16/2011  . Claudication ([place]) 07/16/2011  . BPH (benign prostatic  hyperplasia) 07/16/2011  . Rash 04/29/2011  . CVA (cerebral infarction) 03/31/2011  . Inguinal hernia 03/31/2011    Past Surgical History:  Procedure Laterality Date  . CARDIAC CATHETERIZATION    . ENDARTERECTOMY Right 10/10/2015   Procedure: ENDARTERECTOMY CAROTID-RIGHT;  Surgeon: Serafina Mitchell, MD;  Location: [facility] OR;  Service: Vascular;  Laterality: Right;  . IR RADIOLOGIST EVAL & MGMT  02/11/2017  . IR RADIOLOGIST EVAL & MGMT  03/24/2017       Home Medications    Prior to Admission medications   Medication Sig Start Date End Date Taking? Authorizing Provider  carvedilol (COREG) 6.25 MG tablet Take 6.25 mg by mouth 2 (two) times daily.  03/26/17  Yes [provider]  PROAIR RESPICLICK 197 (90 Base) MCG/ACT AEPB Inhale 2 puffs into the lungs every 6 (six) hours as needed (for wheezing or shortness of breath).  02/17/17  Yes [provider]  albuterol (PROVENTIL HFA;VENTOLIN HFA) 108 (90 Base) MCG/ACT inhaler Inhale 2 puffs into the lungs every 6 (six) hours as needed for wheezing or shortness of breath. Patient not taking: Reported on 04/03/2017 01/13/17   Biagio Borg, MD  apixaban (ELIQUIS) 5 MG TABS tablet Take 1 tablet (5 mg total) by mouth 2 (two) times daily. Patient not taking: Reported on 04/03/2017 12/30/16   Biagio Borg, MD  aspirin EC 81 MG tablet Take 1 tablet (81 mg total) by mouth daily. Patient not taking: Reported on 04/03/2017 06/21/15   Thurnell Lose, MD  atorvastatin (LIPITOR) 80 MG tablet Take 1 tablet (80 mg total) by mouth daily. 01/13/17   Biagio Borg, MD  carvedilol (COREG) 12.5 MG tablet Take 1 tablet (12.5 mg total) by mouth 2 (two) times daily with a meal. Patient not taking: Reported on 04/03/2017 01/13/17   Biagio Borg, MD  ezetimibe (ZETIA) 10 MG tablet Take 1 tablet (10 mg total) by mouth daily. Patient not taking: Reported on 04/03/2017 10/14/15   Dhungel, Flonnie Overman, MD  furosemide (LASIX) 20 MG tablet Take 1 tablet (20 mg total) by mouth  daily. 01/13/17   Biagio Borg, MD  isosorbide mononitrate (IMDUR) 30 MG 24 hr tablet Take 1 tablet (30 mg total) by mouth daily. Patient not taking: Reported on 04/03/2017 01/13/17   Biagio Borg, MD  pantoprazole (PROTONIX) 40 MG tablet Take 1 tablet (40 mg total) by mouth daily. Patient not taking: Reported on 04/03/2017 01/13/17   Biagio Borg, MD  senna-docusate (SENOKOT-S) 8.6-50 MG tablet Take 1 tablet by mouth at bedtime as needed for mild constipation. Patient not taking: Reported on 04/03/2017 10/14/15   Dhungel, Flonnie Overman, MD  tamsulosin (FLOMAX) 0.4 MG CAPS capsule Take 1 capsule (0.4 mg total) by mouth daily after supper. Patient not taking: Reported on 04/03/2017 01/13/17   Biagio Borg, MD    Family History Family History  Problem Relation Age of Onset  . Aneurysm Sister   . Colon cancer Neg Hx     Social History Social History  Substance Use Topics  . Smoking status: Never Smoker  . Smokeless tobacco: Never Used  . Alcohol use No  Allergies   Patient has no known allergies.   Review of Systems Review of Systems  Constitutional: Negative for chills and fever.  HENT: Negative for congestion and sore throat.   Eyes: Negative for photophobia and visual disturbance.  Respiratory: Positive for shortness of breath (Intermittent, not currently.). Negative for cough and chest tightness.   Cardiovascular: Positive for chest pain (Resolved) and leg swelling.  Gastrointestinal: Negative for abdominal pain, constipation, diarrhea, nausea and vomiting.  Genitourinary: Negative for dysuria, flank pain, frequency and hematuria.  Musculoskeletal: Negative for back pain.  Skin: Negative for rash and wound.  Neurological: Negative for dizziness, weakness, light-headedness and headaches.  Hematological: Does not bruise/bleed easily (Supposed to be on blood thinners, not currently taking.).  Psychiatric/Behavioral: Negative for confusion.     Physical Exam Updated Vital Signs BP  (!) 189/102   Pulse 90   Resp (!) 31   SpO2 92%   Physical Exam  Constitutional: He is oriented to person, place, and time. He appears well-developed and well-nourished. No distress.  HENT:  Head: Normocephalic and atraumatic.  Eyes: Pupils are equal, round, and reactive to light. Conjunctivae and EOM are normal.  Neck: Normal range of motion. Neck supple.  Cardiovascular: Normal rate, regular rhythm and intact distal pulses.   Pulmonary/Chest: Effort normal. No respiratory distress. He has no decreased breath sounds. He has no wheezes. He has rales (Bilateral bases). He exhibits no tenderness.  Abdominal: Soft. Bowel sounds are normal. He exhibits distension. There is no tenderness. There is no guarding.  Musculoskeletal: Normal range of motion. He exhibits edema.  Bilateral 2+ pitting edema to the knees. Pedal pulses intact bilaterally. Strength equal bilaterally. Color and warmth equal bilaterally. No tenderness of bilateral calves. Color and warmth equal.   Neurological: He is alert and oriented to person, place, and time.  Skin: Skin is warm and dry.  Psychiatric: He has a normal mood and affect.  Nursing note and vitals reviewed.    ED Treatments / Results  Labs (all labs ordered are listed, but only abnormal results are displayed) Labs Reviewed  BASIC METABOLIC PANEL - Abnormal; Notable for the following:       Result Value   Glucose, Bld 137 (*)    BUN 23 (*)    Creatinine, Ser 1.49 (*)    Calcium 8.4 (*)    GFR calc non Af Amer 44 (*)    GFR calc Af Amer 51 (*)    All other components within normal limits  CBC - Abnormal; Notable for the following:    Hemoglobin 11.2 (*)    HCT 35.1 (*)    MCV 65.4 (*)    MCH 20.9 (*)    RDW 17.7 (*)    All other components within normal limits  BRAIN NATRIURETIC PEPTIDE - Abnormal; Notable for the following:    B Natriuretic Peptide 2,236.3 (*)    All other components within normal limits  TROPONIN I  TROPONIN I  TROPONIN I    URINALYSIS, ROUTINE W REFLEX MICROSCOPIC  I-STAT TROPONIN, ED    EKG  EKG Interpretation  Date/Time:  Saturday April 03 2017 15:59:20 EDT Ventricular Rate:  90 PR Interval:  202 QRS Duration: 88 QT Interval:  364 QTC Calculation: 445 R Axis:   -13 Text Interpretation:  Normal sinus rhythm Left ventricular hypertrophy Non-specific ST-t changes Confirmed by Pattricia Boss [phone]) on 04/03/2017 5:34:45 PM       Radiology Dg Chest 2 View  Result Date: 04/03/2017 CLINICAL DATA:  Shortness of breath EXAM: CHEST  2 VIEW COMPARISON:  Dec 31, 2016 FINDINGS: The mediastinal contour is normal. Heart size is enlarged. There is no focal infiltrate, pulmonary edema. Minimal posterior pleural effusions are identified. The soft tissue and osseous structures are stable. IMPRESSION: Cardiomegaly. No pulmonary edema or focal pneumonia. Minimal bilateral pleural effusions. Electronically Signed   By: Abelardo Diesel M.D.   On: 04/03/2017 17:03    Procedures Procedures (including critical care time)  Medications Ordered in ED Medications - No data to display   Initial Impression / Assessment and Plan / ED Course  I have reviewed the triage vital signs and the nursing notes.  Pertinent labs & imaging results that were available during my care of the patient were reviewed by me and considered in my medical decision making (see chart for details).     Patient presenting with increased leg swelling and acute onset chest pain, shortness of breath, diaphoresis today, which has since resolved. Physical exam shows patient is hypertensive. Bilateral 2+ pitting edema of lower legs. Mild rails bilateral bases. Will order basic labs, BMP, troponin, EKG, cxr.   BNP elevated > 2000. Basic labs shows slight decrease in kidney function. CBC reassuring with no leukocytosis. Troponin negative at 0.07. EKG reassuring. Chest x-ray showed cardiomegaly with mild bibasilar fluid. Will give 1 dose of Lasix. Discussed  case with attending, Dr. Jeanell Sparrow evaluated the patient. Will call hospitalist for admission.  Patient to be admitted by hospitalist service for CHF exacerbation and CP rule out.  Final Clinical Impressions(s) / ED Diagnoses   Final diagnoses:  Acute on chronic congestive heart failure, unspecified heart failure type [facility])  Acute chest pain    New Prescriptions New Prescriptions   No medications on file     Franchot Heidelberg, PA-C 04/03/17 [id number]    Pattricia Boss, MD 04/08/17 1005

## 2017-04-03 NOTE — ED Notes (Signed)
Patient transported to MRI 

## 2017-04-03 NOTE — ED Notes (Signed)
Patient transported to X-ray 

## 2017-04-03 NOTE — H&P (Signed)
[name] IOM:[id number] DOB: 08/10/1940 DOA: 04/03/2017     PCP: Craig Borg, MD   Outpatient Specialists: none Patient coming from:   home Lives alone,     Chief Complaint: Chest pain shortness of breath  HPI: [name] is a 76 y.o. male with medical history significant of coronary disease, history of dilated aortic root, atrial D, history of CVA, HTN poor control secondary to intermittent noncompliance, paroxysmal atrial fibrillation on anticoagulation, prediabetes. Carotid artery disease    Presented with one-week history of intermittent chest pain and shortness of breath worse exertion he ran out of all his medications except Coreg. His blood pressure has been uncontrolled. Patient had been having recurrent problems of medication noncompliance secondary to insufficient funds.  His Friend states he has a history of left-sided weakness after his last stroke but has been progressively getting worse to point where he can no longer ambulate. Patient has stopped his aspirin and eliques. He denies falls but reports difficulty getting up from the commode or completing ADLS that is new and being getting worse over months.  He has chronic slurred speech since CVA and that has not changed.    Regarding pertinent Chronic problems:   Last Echo 10/2015 LV EF: 84% -   53% diastolic function parameters were   normal.  IN ER:  No data recorded.      on arrival  ED Triage Vitals [04/03/17 1600]  Enc Vitals Group     BP (!) 193/130     Pulse Rate 91     Resp 19     Temp      Temp src      SpO2 96 %     Weight      Height      Head Circumference      Peak Flow      Pain Score 6     Pain Loc      Pain Edu?      Excl. in [place]?     Latest  RR 31  92% on RA HR 90 BP 189/102 Trop Istat 0.07 NA 141 K 2.9 Cr 1.49 up from baseline WBC 5.1 Hg 11.2 MCV 65.4  CXR - cardiomegaly no edema minimal bilateral pulmonary effusions Following Medications were ordered in  ER: Medications  furosemide (LASIX) tablet 20 mg (not administered)      Hospitalist was called for admission for Chest pain evaluation setting of portal controlled hypertension secondary to medical noncompliance  Review of Systems:    Pertinent positives include: chest pain,  Bilateral lower extremity swelling  shortness of breath at rest dyspnea on exertion, Constitutional:  No weight loss, night sweats, Fevers, chills, fatigue, weight loss  HEENT:  No headaches, Difficulty swallowing,Tooth/dental problems,Sore throat,  No sneezing, itching, ear ache, nasal congestion, post nasal drip,  Cardio-vascular:  No  Orthopnea, PND, anasarca, dizziness, palpitations.noGI:  No heartburn, indigestion, abdominal pain, nausea, vomiting, diarrhea, change in bowel habits, loss of appetite, melena, blood in stool, hematemesis Resp:  no . No  No excess mucus, no productive cough, No non-productive cough, No coughing up of blood.No change in color of mucus.No wheezing. Skin:  no rash or lesions. No jaundice GU:  no dysuria, change in color of urine, no urgency or frequency. No straining to urinate.  No flank pain.  Musculoskeletal:  No joint pain or no joint swelling. No decreased range of motion. No back pain.  Psych:  No change in mood or  affect. No depression or anxiety. No memory loss.  Neuro: no localizing neurological complaints, no tingling, no weakness, no double vision, no gait abnormality, no slurred speech, no confusion  As per HPI otherwise 10 point review of systems negative.   Past Medical History: Past Medical History:  Diagnosis Date  . Anemia, iron deficiency 08/15/2011  . CAD (coronary artery disease)    cath in 1/08: EF 60%, mild dilated Ao root, oD2 30%, LAD 20-30%.  . Dilated aortic root ([place]) 08/15/2011  . Dyslipidemia   . Dysrhythmia   . History of MI (myocardial infarction) 08/15/2011  . History of pneumonia 08/15/2011  . History of stroke   . Hyperlipidemia   .  Hypertension    echocardiogram 6/10: Moderate LVH, EF 55-65%, mild, mild MR, MAC, mild LAE, PASP 32  . Myocardial infarction ([place])   . Paroxysmal atrial fibrillation (HCC)   . Prediabetes 10/08/2015  . Preoperative cardiovascular examination 10/09/2015  . Shortness of breath dyspnea   . Stroke ([place]) 10/2011  . Systolic congestive heart failure ([place]) 01/13/2017   Past Surgical History:  Procedure Laterality Date  . CARDIAC CATHETERIZATION    . ENDARTERECTOMY Right 10/10/2015   Procedure: ENDARTERECTOMY CAROTID-RIGHT;  Surgeon: Craig Mitchell, MD;  Location: [facility] OR;  Service: Vascular;  Laterality: Right;  . IR RADIOLOGIST EVAL & MGMT  02/11/2017  . IR RADIOLOGIST EVAL & MGMT  03/24/2017     Social History:  Ambulatory with great difficulty    reports that he has never smoked. He has never used smokeless tobacco. He reports that he does not drink alcohol or use drugs.  Allergies:  No Known Allergies     Family History:   Family History  Problem Relation Age of Onset  . Aneurysm Sister   . Colon cancer Neg Hx     Medications: Prior to Admission medications   Medication Sig Start Date End Date Taking? Authorizing Provider  carvedilol (COREG) 6.25 MG tablet Take 6.25 mg by mouth 2 (two) times daily.  03/26/17  Yes [provider]  PROAIR RESPICLICK 161 (90 Base) MCG/ACT AEPB Inhale 2 puffs into the lungs every 6 (six) hours as needed (for wheezing or shortness of breath).  02/17/17  Yes [provider]  albuterol (PROVENTIL HFA;VENTOLIN HFA) 108 (90 Base) MCG/ACT inhaler Inhale 2 puffs into the lungs every 6 (six) hours as needed for wheezing or shortness of breath. Patient not taking: Reported on 04/03/2017 01/13/17   Craig Borg, MD  apixaban (ELIQUIS) 5 MG TABS tablet Take 1 tablet (5 mg total) by mouth 2 (two) times daily. Patient not taking: Reported on 04/03/2017 12/30/16   Craig Borg, MD  aspirin EC 81 MG tablet Take 1 tablet (81 mg total) by mouth  daily. Patient not taking: Reported on 04/03/2017 06/21/15   Thurnell Lose, MD  atorvastatin (LIPITOR) 80 MG tablet Take 1 tablet (80 mg total) by mouth daily. 01/13/17   Craig Borg, MD  carvedilol (COREG) 12.5 MG tablet Take 1 tablet (12.5 mg total) by mouth 2 (two) times daily with a meal. Patient not taking: Reported on 04/03/2017 01/13/17   Craig Borg, MD  ezetimibe (ZETIA) 10 MG tablet Take 1 tablet (10 mg total) by mouth daily. Patient not taking: Reported on 04/03/2017 10/14/15   Dhungel, Flonnie Overman, MD  furosemide (LASIX) 20 MG tablet Take 1 tablet (20 mg total) by mouth daily. 01/13/17   Craig Borg, MD  isosorbide mononitrate (IMDUR) 30 MG 24  hr tablet Take 1 tablet (30 mg total) by mouth daily. Patient not taking: Reported on 04/03/2017 01/13/17   Craig Borg, MD  pantoprazole (PROTONIX) 40 MG tablet Take 1 tablet (40 mg total) by mouth daily. Patient not taking: Reported on 04/03/2017 01/13/17   Craig Borg, MD  senna-docusate (SENOKOT-S) 8.6-50 MG tablet Take 1 tablet by mouth at bedtime as needed for mild constipation. Patient not taking: Reported on 04/03/2017 10/14/15   Dhungel, Flonnie Overman, MD  tamsulosin (FLOMAX) 0.4 MG CAPS capsule Take 1 capsule (0.4 mg total) by mouth daily after supper. Patient not taking: Reported on 04/03/2017 01/13/17   Craig Borg, MD    Physical Exam: Patient Vitals for the past 24 hrs:  BP Pulse Resp SpO2  04/03/17 1618 (!) 189/102 90 (!) 31 92 %  04/03/17 1600 (!) 193/130 91 19 96 %    1. General:  in No Acute distress  Chronically ill   appearing 2. Psychological: Alert and  Oriented 3. Head/ENT:   Moist   Mucous Membranes                          Head Non traumatic, neck supple                            Poor Dentition 4. SKIN: normal  Skin turgor,  Skin clean Dry and intact no rash 5. Heart: Regular rate and rhythm   Murmur, no Rub or gallop 6. Lungs:  no wheezes mild crackles   7. Abdomen: Soft,  non-tender,  distended   8. Lower extremities:  no clubbing, cyanosis, 3+ edema 9. Neurologically Left side weakness per friend worse from baseline 10. MSK: Normal range of motion   body mass index is unknown because there is no height or weight on file.  Labs on Admission:   Labs on Admission: I have personally reviewed following labs and imaging studies  CBC:  Recent Labs Lab 04/03/17 1605  WBC 5.1  HGB 11.2*  HCT 35.1*  MCV 65.4*  PLT 073   Basic Metabolic Panel:  Recent Labs Lab 04/03/17 1605  NA 141  K 3.9  CL 110  CO2 22  GLUCOSE 137*  BUN 23*  CREATININE 1.49*  CALCIUM 8.4*   GFR: CrCl cannot be calculated (Unknown ideal weight.). Liver Function Tests: No results for input(s): AST, ALT, ALKPHOS, BILITOT, PROT, ALBUMIN in the last 168 hours. No results for input(s): LIPASE, AMYLASE in the last 168 hours. No results for input(s): AMMONIA in the last 168 hours. Coagulation Profile: No results for input(s): INR, PROTIME in the last 168 hours. Cardiac Enzymes: No results for input(s): CKTOTAL, CKMB, CKMBINDEX, TROPONINI in the last 168 hours. BNP (last 3 results)  Recent Labs  01/28/17 1526  PROBNP 1,033.0*   HbA1C: No results for input(s): HGBA1C in the last 72 hours. CBG: No results for input(s): GLUCAP in the last 168 hours. Lipid Profile: No results for input(s): CHOL, HDL, LDLCALC, TRIG, CHOLHDL, LDLDIRECT in the last 72 hours. Thyroid Function Tests: No results for input(s): TSH, T4TOTAL, FREET4, T3FREE, THYROIDAB in the last 72 hours. Anemia Panel: No results for input(s): VITAMINB12, FOLATE, FERRITIN, TIBC, IRON, RETICCTPCT in the last 72 hours. Urine analysis:   Sepsis Labs: _0 (procalcitonin:4,lacticidven:4) )No results found for this or any previous visit (from the past 240 hour(s)).    UA  ordered  Lab Results  Component Value Date  HGBA1C 6.2 (H) 10/07/2015    CrCl cannot be calculated (Unknown ideal weight.).  BNP (last 3 results)  Recent Labs  01/28/17 1526   PROBNP 1,033.0*     ECG REPORT  Independently reviewed Rate: 90  Rhythm: NSR with LV hypertrophy ST&T Change: No acute ischemic changes  QTC 445   There were no vitals filed for this visit.   Cultures:    Component Value Date/Time   SDES BLOOD RIGHT HAND 11/05/2015 1543   SPECREQUEST BOTTLES DRAWN AEROBIC AND ANAEROBIC 5CC 11/05/2015 1543   CULT NO GROWTH 5 DAYS 11/05/2015 1543   REPTSTATUS 11/10/2015 FINAL 11/05/2015 1543     Radiological Exams on Admission: Dg Chest 2 View  Result Date: 04/03/2017 CLINICAL DATA:  Shortness of breath EXAM: CHEST  2 VIEW COMPARISON:  Dec 31, 2016 FINDINGS: The mediastinal contour is normal. Heart size is enlarged. There is no focal infiltrate, pulmonary edema. Minimal posterior pleural effusions are identified. The soft tissue and osseous structures are stable. IMPRESSION: Cardiomegaly. No pulmonary edema or focal pneumonia. Minimal bilateral pleural effusions. Electronically Signed   By: Abelardo Diesel M.D.   On: 04/03/2017 17:03    Chart has been reviewed    Assessment/Plan   76 y.o. male with medical history significant of coronary disease, history of dilated aortic root, atrial D, history of CVA, HTN poor control secondary to intermittent noncompliance, paroxysmal atrial fibrillation on anticoagulation, prediabetes. Carotid artery disease    Admitted for Chest pain evaluation setting of portal controlled hypertension secondary to medical noncompliance    Present on Admission:    . Chest pain - setting of poorly controlled hypertension, cycle CE, obtain echo . CKD (chronic kidney disease), stage III - worsening renal function, possibly secondary to poorly controlled hypertension. There is to be fluid overload at this point we'll diuresis . HTN (hypertension) - accelerated poorly  controlled restart home medications if no evidence of acute CVA ovoid overaggressive drop, Currently chest pain free. MRI unremarkable restart home  medications if NO response will initiate Nitroglycerine Gtt and admit to stepdown Acute on chronic diastolic CHF exacerbation - repeat echogram diuresis. hOld ACE inhibitor given CKD, evaluated for possible right side heart failure . Paroxysmal atrial fibrillation (HCC) - patient has been noncompliant with home medications noncompliance of anticoagulation or aspirin.  Continue COreg GIVEN increased risk of CVA due to CHA2DS2 vasc score > 5 Would restart. Order Care coordinator consult to help with medication needs . Cardiomegaly - Order echo  . Left hemiplegia (HCC) - acute on chronic Pain MRI to evaluate for repeat CVA given patient's noncompliance for medications PCO2 evaluation patient may benefit from placement . Prediabetes order sliding scale . CAD (coronary artery disease) - restart aspirin, statin and betablocker  Other plan as per orders.  DVT prophylaxis:  SCD   Code Status:  FULL COD  as per patient    Family Communication:   Family not  at  Bedside  plan of care was discussed with Friend Disposition Plan:    likely will need placement for rehabilitation                                             Would benefit from PT/OT eval prior to DC ordered                      Nutrition  consulted                          Consults called: email cardiology  call urology if abnormal MRI  Admission status: inpatient     Level of care     tele      I have spent a total of 56 min on this admission   Taylorann Tkach 04/03/2017, 10:25 PM    [facility]  Pager [phone]   after 2 AM please page floor coverage PA If 7AM-7PM, please contact the day team taking care of the patient  [email or web address]  Password [email or web address]

## 2017-04-03 NOTE — ED Triage Notes (Signed)
Pt states for the past week he has been having chest pain and shortness of breath worse with walking, pt is diaphoretic in triage and hypertensive.

## 2017-04-04 DIAGNOSIS — I160 Hypertensive urgency: Secondary | ICD-10-CM

## 2017-04-04 DIAGNOSIS — I119 Hypertensive heart disease without heart failure: Secondary | ICD-10-CM | POA: Diagnosis present

## 2017-04-04 DIAGNOSIS — Z8673 Personal history of transient ischemic attack (TIA), and cerebral infarction without residual deficits: Secondary | ICD-10-CM

## 2017-04-04 DIAGNOSIS — N183 Chronic kidney disease, stage 3 (moderate): Secondary | ICD-10-CM

## 2017-04-04 DIAGNOSIS — I509 Heart failure, unspecified: Secondary | ICD-10-CM

## 2017-04-04 DIAGNOSIS — I480 Paroxysmal atrial fibrillation: Secondary | ICD-10-CM

## 2017-04-04 LAB — HEMOGLOBIN A1C
HEMOGLOBIN A1C: 6.5 % — AB (ref 4.8–5.6)
Mean Plasma Glucose: 139.85 mg/dL

## 2017-04-04 LAB — PHOSPHORUS: Phosphorus: 2.7 mg/dL (ref 2.5–4.6)

## 2017-04-04 LAB — VITAMIN B12: VITAMIN B 12: 776 pg/mL (ref 180–914)

## 2017-04-04 LAB — RETICULOCYTES
RBC.: 5.78 MIL/uL (ref 4.22–5.81)
RETIC CT PCT: 1.6 % (ref 0.4–3.1)
Retic Count, Absolute: 92.5 10*3/uL (ref 19.0–186.0)

## 2017-04-04 LAB — LIPID PANEL
Cholesterol: 144 mg/dL (ref 0–200)
HDL: 23 mg/dL — ABNORMAL LOW (ref >40)
LDL CALC: 111 mg/dL — AB (ref 0–99)
Total CHOL/HDL Ratio: 6.3 RATIO
Triglycerides: 50 mg/dL (ref <150)
VLDL: 10 mg/dL (ref 0–40)

## 2017-04-04 LAB — PREALBUMIN: Prealbumin: 12.9 mg/dL — ABNORMAL LOW (ref 18–38)

## 2017-04-04 LAB — GLUCOSE, CAPILLARY
Glucose-Capillary: 109 mg/dL — ABNORMAL HIGH (ref 65–99)
Glucose-Capillary: 172 mg/dL — ABNORMAL HIGH (ref 65–99)

## 2017-04-04 LAB — TROPONIN I
Troponin I: 0.03 ng/mL (ref <0.03)
Troponin I: 0.04 ng/mL (ref <0.03)

## 2017-04-04 LAB — HEPATIC FUNCTION PANEL
ALBUMIN: 2.6 g/dL — AB (ref 3.5–5.0)
ALK PHOS: 117 U/L (ref 38–126)
ALT: 70 U/L — ABNORMAL HIGH (ref 17–63)
AST: 46 U/L — AB (ref 15–41)
BILIRUBIN TOTAL: 0.8 mg/dL (ref 0.3–1.2)
Bilirubin, Direct: 0.3 mg/dL (ref 0.1–0.5)
Indirect Bilirubin: 0.5 mg/dL (ref 0.3–0.9)
Total Protein: 6.5 g/dL (ref 6.5–8.1)

## 2017-04-04 LAB — PROTIME-INR
INR: 1.35
Prothrombin Time: 16.5 seconds — ABNORMAL HIGH (ref 11.4–15.2)

## 2017-04-04 LAB — IRON AND TIBC
Iron: 66 ug/dL (ref 45–182)
SATURATION RATIOS: 17 % — AB (ref 17.9–39.5)
TIBC: 396 ug/dL (ref 250–450)
UIBC: 330 ug/dL

## 2017-04-04 LAB — MAGNESIUM: MAGNESIUM: 1.8 mg/dL (ref 1.7–2.4)

## 2017-04-04 LAB — CBG MONITORING, ED: Glucose-Capillary: 128 mg/dL — ABNORMAL HIGH (ref 65–99)

## 2017-04-04 LAB — FOLATE: Folate: 14.4 ng/mL (ref >5.9)

## 2017-04-04 LAB — FERRITIN: Ferritin: 84 ng/mL (ref 24–336)

## 2017-04-04 MED ORDER — CARVEDILOL 12.5 MG PO TABS
12.5000 mg | ORAL_TABLET | Freq: Two times a day (BID) | ORAL | Status: DC
  Administered 2017-04-04 – 2017-04-06 (×5): 12.5 mg via ORAL
  Filled 2017-04-04 (×5): qty 1

## 2017-04-04 MED ORDER — FUROSEMIDE 10 MG/ML IJ SOLN
40.0000 mg | Freq: Two times a day (BID) | INTRAMUSCULAR | Status: DC
  Administered 2017-04-04 – 2017-04-06 (×5): 40 mg via INTRAVENOUS
  Filled 2017-04-04 (×5): qty 4

## 2017-04-04 MED ORDER — ATORVASTATIN CALCIUM 80 MG PO TABS
80.0000 mg | ORAL_TABLET | Freq: Every day | ORAL | Status: DC
  Administered 2017-04-04 – 2017-04-05 (×2): 80 mg via ORAL
  Filled 2017-04-04 (×3): qty 1

## 2017-04-04 MED ORDER — ASPIRIN EC 81 MG PO TBEC
81.0000 mg | DELAYED_RELEASE_TABLET | Freq: Every day | ORAL | Status: DC
  Administered 2017-04-04 – 2017-04-06 (×3): 81 mg via ORAL
  Filled 2017-04-04 (×3): qty 1

## 2017-04-04 MED ORDER — ACETAMINOPHEN 160 MG/5ML PO SOLN
650.0000 mg | ORAL | Status: DC | PRN
  Filled 2017-04-04: qty 20.3

## 2017-04-04 MED ORDER — INSULIN ASPART 100 UNIT/ML ~~LOC~~ SOLN
0.0000 [IU] | Freq: Three times a day (TID) | SUBCUTANEOUS | Status: DC
  Administered 2017-04-05 – 2017-04-06 (×3): 1 [IU] via SUBCUTANEOUS

## 2017-04-04 MED ORDER — INSULIN ASPART 100 UNIT/ML ~~LOC~~ SOLN: 0.0000 [IU] | SUBCUTANEOUS | Status: DC

## 2017-04-04 MED ORDER — NITROGLYCERIN IN D5W 200-5 MCG/ML-% IV SOLN
0.0000 ug/min | INTRAVENOUS | Status: DC
  Filled 2017-04-04: qty 250

## 2017-04-04 MED ORDER — LISINOPRIL 2.5 MG PO TABS
2.5000 mg | ORAL_TABLET | Freq: Every day | ORAL | Status: DC
  Administered 2017-04-04 – 2017-04-06 (×3): 2.5 mg via ORAL
  Filled 2017-04-04 (×3): qty 1

## 2017-04-04 MED ORDER — SENNOSIDES-DOCUSATE SODIUM 8.6-50 MG PO TABS: 1.0000 | ORAL_TABLET | Freq: Every evening | ORAL | Status: DC | PRN

## 2017-04-04 MED ORDER — CARVEDILOL 6.25 MG PO TABS: 6.2500 mg | ORAL_TABLET | Freq: Two times a day (BID) | ORAL | Status: DC

## 2017-04-04 MED ORDER — ACETAMINOPHEN 650 MG RE SUPP: 650.0000 mg | RECTAL | Status: DC | PRN

## 2017-04-04 MED ORDER — ACETAMINOPHEN 325 MG PO TABS
650.0000 mg | ORAL_TABLET | ORAL | Status: DC | PRN
  Administered 2017-04-04: 650 mg via ORAL
  Filled 2017-04-04: qty 2

## 2017-04-04 NOTE — Progress Notes (Signed)
Patient admitted to [facility] from Emergency Room, VSS, no complaints of pain or shortness of breath.  Patient arrived to unit with 2 wallets in a bag with $300 cash money, this was taken back down by ED nurse to be locked up in security.  Patient wanted to keep his wallets, they were put in his pants pockets and bag put in room closet.

## 2017-04-04 NOTE — Consult Note (Signed)
Cardiology Consultation:   Patient ID: [name]; [id number]; 06-28-1941   Admit date: 04/03/2017 Date of Consult: 04/04/2017  Primary Care Provider: Biagio Borg, MD Primary Cardiologist:Formerly Dr. Lia Foyer     Patient Profile:   [name] is a 76 y.o. male with a hx of carotid artery stenosis,s/p right carotid endarterectomy 2017). CVA, 10/2015. difficult to control  hypertension, hyperlipidemia, CAD ( cath 1/08: EF 60%, mild dilated Ao root, oD2 30%, LAD 20-30%). , PAF (CHADS VASC Score of 6 on Eliquis), BPH, medical non-compliance, who is being seen today for the evaluation of dyspnea and chest pain at the request of Dr. Roel Cluck, Hospitalist Service.    History of Present Illness:   [name] presented to the emergency room with worsening chest pressure and dyspnea which had occurred over the last 2-3 months. The patient states that he had been running out of his medications and unable to afford to take them. He only is able to by a little at a time. The patient states he is down to 2 medications daily does not remember the names of them. He also admits to LEE at home.   He was last seen by cardiology in February of 2017 for pre-operative evaluation for right carotid endarterectomy.   On arrival to the emergency room his blood pressure was 193/130, heart rate 91, O2 sat 96% patient was afebrile. Troponin 0.03, 0.03, and 0.04 respectively.the patient was found to have elevated LFTs, (AST 46, ALT 70) and low albumin and sees 2.6). Creatinine 1.49, BUN 23, glucose 137. Hemoglobin 11.2, hematocrit 35.1, platelets 169.BNP PT 16.5, INR 1.35.  EKG revealed normal sinus rhythm, heart rate 94 bpm, with no acute ST-T wave changes, mild LVH.  MR of the brain without contrast was completed in the setting of CVA and anticoagulation therapy.he is not found have any intercranial bleeding CXR, revealed cardiomegaly no pulmonary edema or pneumonia. .Minimal bilateral effusions   He was  treated with hydralazine, po, and IV, lasix 20 mg po, isosorbide 30 mg po, and place temporarily on NTG gtt. He was also given dose of Eliquis.   The patient is currently pain-free, breathing normally, blood pressure much better controlled 153/76.   Past Medical History:  Diagnosis Date  . Anemia, iron deficiency 08/15/2011  . CAD (coronary artery disease)    cath in 1/08: EF 60%, mild dilated Ao root, oD2 30%, LAD 20-30%.  . Dilated aortic root ([place]) 08/15/2011  . Dyslipidemia   . Dysrhythmia   . History of MI (myocardial infarction) 08/15/2011  . History of pneumonia 08/15/2011  . History of stroke   . Hyperlipidemia   . Hypertension    echocardiogram 6/10: Moderate LVH, EF 55-65%, mild, mild MR, MAC, mild LAE, PASP 32  . Myocardial infarction ([place])   . Paroxysmal atrial fibrillation ([facility])   . Prediabetes 10/08/2015  . Preoperative cardiovascular examination 10/09/2015  . Shortness of breath dyspnea   . Stroke ([place]) 10/2011  . Systolic congestive heart failure ([place]) 01/13/2017    Past Surgical History:  Procedure Laterality Date  . CARDIAC CATHETERIZATION    . ENDARTERECTOMY Right 10/10/2015   Procedure: ENDARTERECTOMY CAROTID-RIGHT;  Surgeon: Serafina Mitchell, MD;  Location: [facility] OR;  Service: Vascular;  Laterality: Right;  . IR RADIOLOGIST EVAL & MGMT  02/11/2017  . IR RADIOLOGIST EVAL & MGMT  03/24/2017     Home Medications:  Prior to Admission medications   Medication Sig Start Date End Date Taking? Authorizing Provider  carvedilol (COREG) 6.25 MG tablet Take 6.25 mg by mouth 2 (two) times daily.  03/26/17  Yes [provider]  PROAIR RESPICLICK 938 (90 Base) MCG/ACT AEPB Inhale 2 puffs into the lungs every 6 (six) hours as needed (for wheezing or shortness of breath).  02/17/17  Yes [provider]  albuterol (PROVENTIL HFA;VENTOLIN HFA) 108 (90 Base) MCG/ACT inhaler Inhale 2 puffs into the lungs every 6 (six) hours as needed for wheezing or shortness of  breath. Patient not taking: Reported on 04/03/2017 01/13/17   Biagio Borg, MD  apixaban (ELIQUIS) 5 MG TABS tablet Take 1 tablet (5 mg total) by mouth 2 (two) times daily. Patient not taking: Reported on 04/03/2017 12/30/16   Biagio Borg, MD  aspirin EC 81 MG tablet Take 1 tablet (81 mg total) by mouth daily. Patient not taking: Reported on 04/03/2017 06/21/15   Thurnell Lose, MD  atorvastatin (LIPITOR) 80 MG tablet Take 1 tablet (80 mg total) by mouth daily. 01/13/17   Biagio Borg, MD  carvedilol (COREG) 12.5 MG tablet Take 1 tablet (12.5 mg total) by mouth 2 (two) times daily with a meal. Patient not taking: Reported on 04/03/2017 01/13/17   Biagio Borg, MD  ezetimibe (ZETIA) 10 MG tablet Take 1 tablet (10 mg total) by mouth daily. Patient not taking: Reported on 04/03/2017 10/14/15   Dhungel, Flonnie Overman, MD  furosemide (LASIX) 20 MG tablet Take 1 tablet (20 mg total) by mouth daily. 01/13/17   Biagio Borg, MD  isosorbide mononitrate (IMDUR) 30 MG 24 hr tablet Take 1 tablet (30 mg total) by mouth daily. Patient not taking: Reported on 04/03/2017 01/13/17   Biagio Borg, MD  pantoprazole (PROTONIX) 40 MG tablet Take 1 tablet (40 mg total) by mouth daily. Patient not taking: Reported on 04/03/2017 01/13/17   Biagio Borg, MD  senna-docusate (SENOKOT-S) 8.6-50 MG tablet Take 1 tablet by mouth at bedtime as needed for mild constipation. Patient not taking: Reported on 04/03/2017 10/14/15   Dhungel, Flonnie Overman, MD  tamsulosin (FLOMAX) 0.4 MG CAPS capsule Take 1 capsule (0.4 mg total) by mouth daily after supper. Patient not taking: Reported on 04/03/2017 01/13/17   Biagio Borg, MD    Inpatient Medications: Scheduled Meds: . apixaban  5 mg Oral BID  . aspirin EC  81 mg Oral Daily  . furosemide  40 mg Intravenous Q12H  . isosorbide mononitrate  30 mg Oral Daily   Continuous Infusions: . nitroGLYCERIN Stopped (04/04/17 0313)   PRN Meds: hydrALAZINE  Allergies:   No Known Allergies  Social History:    Social History   Social History  . Marital status: Widowed    Spouse name: N/A  . Number of children: 2  . Years of education: N/A   Occupational History  . retired    Social History Main Topics  . Smoking status: Never Smoker  . Smokeless tobacco: Never Used  . Alcohol use No  . Drug use: No  . Sexual activity: Not on file   Other Topics Concern  . Not on file   Social History Narrative  . No narrative on file    Family History:    Family History  Problem Relation Age of Onset  . Aneurysm Sister   . Colon cancer Neg Hx      ROS:  Please see the history of present illness.  ROS  All other ROS reviewed and negative.     Physical Exam/Data:   Vitals:  04/04/17 0745 04/04/17 0833 04/04/17 0915 04/04/17 0930  BP: (!) 167/94 (!) 189/100 (!) 154/95 (!) 160/84  Pulse: 85  86 87  Resp: (!) 21  (!) 27 (!) 21  Temp:      SpO2: 94%  95% 97%    Intake/Output Summary (Last 24 hours) at 04/04/17 0948 Last data filed at 04/04/17 0841  Gross per 24 hour  Intake                0 ml  Output             2500 ml  Net            -2500 ml   There were no vitals filed for this visit. There is no height or weight on file to calculate BMI.  General:  Well nourished, well developed, in no acute distress HEENT: normal Lymph: no adenopathy Neck: no JVD Endocrine:  No thryomegaly Vascular: No carotid bruits; FA pulses 2+ bilaterally without bruits  Cardiac:  normal S1, S2; RRR; soft systolic murmur.  Lungs:  Clear to auscultation bilaterally, no wheezing, rhonchi or rales  Abd: soft, nontender, no hepatomegaly  Ext: no edema Musculoskeletal:  No deformities, generalized weakness.  Skin: warm and dry  Neuro: Some dysphasia.  Psych:  Normal affect   EKG:  The EKG was personally reviewed and demonstrates:  NSR with LVH, HR 90 bpm.  Telemetry:  Telemetry was personally reviewed and demonstrates:  NSR rates in the 80-90's.   Relevant CV Studies: Echocardiogram 10/07/2015   - Left ventricle: The cavity size was normal. Wall thickness was   increased in a pattern of severe LVH. Systolic function was   normal. The estimated ejection fraction was in the range of 55%   to 60%. Left ventricular diastolic function parameters were   normal. - Aortic valve: There was mild regurgitation. - Mitral valve: There was mild regurgitation. - Left atrium: The atrium was moderately dilated.  Laboratory Data:  Chemistry  Recent Labs Lab 04/03/17 1605  NA 141  K 3.9  CL 110  CO2 22  GLUCOSE 137*  BUN 23*  CREATININE 1.49*  CALCIUM 8.4*  GFRNONAA 44*  GFRAA 51*  ANIONGAP 9     Recent Labs Lab 04/04/17 0051  PROT 6.5  ALBUMIN 2.6*  AST 46*  ALT 70*  ALKPHOS 117  BILITOT 0.8   Hematology  Recent Labs Lab 04/03/17 1605 04/04/17 0620  WBC 5.1  --   RBC 5.37 5.78  HGB 11.2*  --   HCT 35.1*  --   MCV 65.4*  --   MCH 20.9*  --   MCHC 31.9  --   RDW 17.7*  --   PLT 169  --    Cardiac Enzymes  Recent Labs Lab 04/03/17 1845 04/04/17 0051 04/04/17 0620  TROPONINI 0.03* 0.03* 0.04*     Recent Labs Lab 04/03/17 1609  TROPIPOC 0.07    BNP  Recent Labs Lab 04/03/17 1605  BNP 2,236.3*    DDimer No results for input(s): DDIMER in the last 168 hours.  Radiology/Studies:  Dg Chest 2 View  Result Date: 04/03/2017 CLINICAL DATA:  Shortness of breath EXAM: CHEST  2 VIEW COMPARISON:  Dec 31, 2016 FINDINGS: The mediastinal contour is normal. Heart size is enlarged. There is no focal infiltrate, pulmonary edema. Minimal posterior pleural effusions are identified. The soft tissue and osseous structures are stable. IMPRESSION: Cardiomegaly. No pulmonary edema or focal pneumonia. Minimal bilateral pleural effusions. Electronically Signed  By: Abelardo Diesel M.D.   On: 04/03/2017 17:03   Mr Brain Wo Contrast  Result Date: 04/04/2017 CLINICAL DATA:  76 y/o  M; chest pain and severe hypertension. EXAM: MRI HEAD WITHOUT CONTRAST TECHNIQUE: Multiplanar,  multiecho pulse sequences of the brain and surrounding structures were obtained without intravenous contrast. COMPARISON:  10/11/2015 MRI of the head. FINDINGS: Brain: No acute infarction, hemorrhage, hydrocephalus, extra-axial collection or mass lesion. Stable patchy nonspecific foci of T2 FLAIR hyperintense signal abnormality in subcortical and periventricular white matter is compatible with moderate chronic microvascular ischemic changes for age. Moderate brain parenchymal volume loss. Stable left frontal, right mid corona radiata, and left posterior corona radiata chronic lacunar infarctions. Few punctate foci of susceptibility hypointensity within the right parietal lobe compatible with hemosiderin deposition of chronic microhemorrhage. Vascular: Normal flow voids. Skull and upper cervical spine: Normal marrow signal. Sinuses/Orbits: Mild left maxillary sinus mucosal thickening. No abnormal signal of mastoid air cells. Orbits are unremarkable. Other: None. IMPRESSION: 1. No acute intracranial abnormality identified. No findings of PRES. 2. Stable moderate chronic microvascular ischemic changes and mild parenchymal volume loss of the brain. 3. Mild left maxillary sinus disease. Electronically Signed   By: Kristine Garbe M.D.   On: 04/04/2017 00:13    Assessment and Plan:   1. Chest Pain:  Likely related to hypertension, now resolved with normalization of BP. Troponin is minimally elevated in light of demand ischemia. Breathing status has improved with lasix.  Consider OP NM stress test if troponin is negative with known history of non-obstructive CAD per cath in 2008.   2. Uncontrolled Hypertension: Multifactorial due to medical non-compliance and lack of affordability per patient. He has been out of his medications intermittently for over 3 months. He is only taking two of them, but does not remember which they are. He is better controlled on hydralazine. Review of home medications.reveals that  he is on isosorbide, coreg 12.5 mg BID. Would try and simplify antihypertensives as much as possible to encourage compliance. Creatinine 1.49   Plan echocardiogram for evaluation of LV function and assistance with management.   3. PAF: No evidence of atrial fib on ER EKG or on telemetry. CHADS VASC Score of 6. Would continue Eliquis as directed. Does not appear to be taking it with normal INR.   4. Hx of CVA:  MR of brain negative for acute intracranial abnormality or bleeding.   5. Hx of Hyperlipidemia: Was on Lipitor 80 mg but not taking. Would check fasting lipids. With elevated LFT;s will need to discuss adding statin or not during hospitalization.    Signed, Jory Sims, NP  04/04/2017 9:48 AM   I have seen, examined the patient, and reviewed the above assessment and plan.  On exam, RRR.  Bibasilar rales + edema. Changes to above are made where necessary.  Pt with noncompliance complicating care.  Now presents with hypertensive urgency with CHF.  Admitted to hospitalitis.  Will update echo, cycle CMs, and optimize medicines.  If rules out for MI, would plan outpatient myoview.  Importance of compliance stressed with the patient today.  Cardiology to follow while here.  Co Sign: Thompson Grayer, MD 04/04/2017 10:25 AM

## 2017-04-04 NOTE — Plan of Care (Signed)
Problem: Cardiac: Goal: Ability to achieve and maintain adequate cardiopulmonary perfusion will improve Outcome: Progressing Maintains oxygen saturation levels in the 90's on room air   Problem: Education: Goal: Ability to verbalize understanding of medication therapies will improve Outcome: Not Progressing Patient needs assistance with medications, case management consult made

## 2017-04-04 NOTE — Progress Notes (Signed)
[facility] TEAM 1 - Stepdown/ICU TEAM  [name]  YQM:[id number] DOB: April 18, 1941 DOA: 04/03/2017 PCP: Biagio Borg, MD    Brief Narrative:  76 y.o. male with a history of CAD, dilated aortic root, CVA w/ residual L sided weakness and slurred speech, HTN, parox Afib on anticoagulation, and prediabetes who presented with a one week history of intermittent chest pain and shortness of breath coming on after he ran out of all his medications.    Subjective: Resting comfortably w/ no new complaints.  Denies current HA, CP, or SOB.    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 - follow trend - control BP   Recent Labs Lab 04/03/17 1605  CREATININE 1.49*    Malignant HTN / Hypertensive emergency (chest pain) Much improved - due to noncompliance w/ meds - avoid overaggressive rapid reduction in SBP   Acute exacerbation of chronic diastolic CHF  Appears much improved w/ improved BP - follow - TTE pending   Parox Afib CHA2DS2 VASc score is 6 - noncompliant w/ Eliquis or ASA - resume anticoag once clear intervention will not be required this hospital stay - choice of agent will be challenging given noncompliance as well as financial concerns   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 rate 18, height 5\' 6"  (1.676 m), weight 70.4 kg (155 lb 3 oz), SpO2 93 %.  Intake/Output Summary (Last 24 hours) at 04/04/17 1624 Last data filed at 04/04/17 1404  Gross per 24 hour  Intake                0 ml  Output             5000 ml  Net            -5000 ml   Filed Weights   04/04/17 1429  Weight: 70.4 kg (155 lb 3 oz)    Examination: General: No acute respiratory distress Lungs: Clear to auscultation bilaterally without wheezes or crackles Cardiovascular: Regular rate and rhythm without murmur gallop or rub normal S1 and S2 Abdomen: Nontender, nondistended, soft, bowel sounds positive, no rebound, no ascites, no appreciable mass Extremities: No significant cyanosis, clubbing, or edema bilateral lower extremities  CBC:  Recent Labs Lab 04/03/17 1605  WBC 5.1  HGB 11.2*  HCT 35.1*  MCV 65.4*  PLT 528   Basic Metabolic Panel:  Recent Labs Lab 04/03/17 1605 04/04/17 0051 04/04/17 0620  NA 141  --   --   K 3.9  --   --   CL 110  --   --   CO2 22  --   --  GLUCOSE 137*  --   --   BUN 23*  --   --   CREATININE 1.49*  --   --   CALCIUM 8.4*  --   --   MG  --  1.8  --   PHOS  --   --  2.7   GFR: Estimated Creatinine Clearance: 38.7 mL/min (A) (by C-G formula based on SCr of 1.49 mg/dL (H)).  Liver Function Tests:  Recent Labs Lab 04/04/17 0051  AST 46*  ALT 70*  ALKPHOS 117  BILITOT 0.8  PROT 6.5  ALBUMIN 2.6*    Coagulation Profile:  Recent Labs Lab 04/04/17 0620  INR 1.35    Cardiac Enzymes:  Recent Labs Lab 04/03/17 1845 04/04/17 0051 04/04/17 0620  TROPONINI 0.03* 0.03* 0.04*    HbA1C: Hgb A1c MFr Bld  Date/Time Value Ref Range Status  04/04/2017 01:06 PM 6.5 (H) 4.8 - 5.6 % Final    Comment:    (NOTE) Pre diabetes:          5.7%-6.4% Diabetes:              >6.4% Glycemic control for   <7.0% adults with  diabetes   10/07/2015 11:14 AM 6.2 (H) 4.8 - 5.6 % Final    Comment:    (NOTE)         Pre-diabetes: 5.7 - 6.4         Diabetes: >6.4         Glycemic control for adults with diabetes: <7.0     CBG:  Recent Labs Lab 04/04/17 1257  GLUCAP 128*    Scheduled Meds: . apixaban  5 mg Oral BID  . aspirin EC  81 mg Oral Daily  . atorvastatin  80 mg Oral q1800  . carvedilol  12.5 mg Oral BID WC  . furosemide  40 mg Intravenous Q12H  . insulin aspart  0-9 Units Subcutaneous TID WC  . isosorbide mononitrate  30 mg Oral Daily  . lisinopril  2.5 mg Oral Daily     LOS: 1 day   Cherene Altes, MD [facility] Office  [phone] Pager - Text Page per Amion as per below:  On-Call/Text Page:      [email or web address]      password [email or web address]  If 7PM-7AM, please contact night-coverage [email or web address] Password [facility] 04/04/2017, 4:24 PM

## 2017-04-05 ENCOUNTER — Inpatient Hospital Stay (HOSPITAL_COMMUNITY): Payer: Medicare Other

## 2017-04-05 DIAGNOSIS — I2510 Atherosclerotic heart disease of native coronary artery without angina pectoris: Secondary | ICD-10-CM

## 2017-04-05 DIAGNOSIS — I119 Hypertensive heart disease without heart failure: Secondary | ICD-10-CM

## 2017-04-05 DIAGNOSIS — Z7901 Long term (current) use of anticoagulants: Secondary | ICD-10-CM

## 2017-04-05 DIAGNOSIS — I480 Paroxysmal atrial fibrillation: Secondary | ICD-10-CM

## 2017-04-05 DIAGNOSIS — I110 Hypertensive heart disease with heart failure: Secondary | ICD-10-CM

## 2017-04-05 DIAGNOSIS — I2583 Coronary atherosclerosis due to lipid rich plaque: Secondary | ICD-10-CM

## 2017-04-05 LAB — ECHOCARDIOGRAM COMPLETE
Area-P 1/2: 2.62 cm2
CHL CUP TV REG PEAK VELOCITY: 257 cm/s
E decel time: 285 msec
FS: 14 % — AB (ref 28–44)
HEIGHTINCHES: 66 in
IV/PV OW: 1
LA ID, A-P, ES: 42 mm
LA diam end sys: 42 mm
LA vol index: 50.6 mL/m2
LA vol: 92.4 mL
LADIAMINDEX: 2.3 cm/m2
LAVOLA4C: 88.6 mL
LV PW d: 18 mm — AB (ref 0.6–1.1)
LV dias vol index: 62 mL/m2
LV sys vol: 65 mL — AB
LVDIAVOL: 113 mL (ref 62–150)
LVOT SV: 51 mL
LVOT VTI: 16.4 cm
LVOT area: 3.14 cm2
LVOT diameter: 20 mm
LVOT peak grad rest: 3 mmHg
LVOT peak vel: 93.3 cm/s
LVSYSVOLIN: 36 mL/m2
MV Dec: 285
MVPKAVEL: 45.8 m/s
MVPKEVEL: 70.7 m/s
MVSPHT: 84 ms
RV sys press: 29 mmHg
Simpson's disk: 42
Stroke v: 48 ml
TR max vel: 257 cm/s
WEIGHTICAEL: 2500.8 [oz_av]

## 2017-04-05 LAB — COMPREHENSIVE METABOLIC PANEL
ALK PHOS: 99 U/L (ref 38–126)
ALT: 59 U/L (ref 17–63)
ANION GAP: 5 (ref 5–15)
AST: 42 U/L — ABNORMAL HIGH (ref 15–41)
Albumin: 2.4 g/dL — ABNORMAL LOW (ref 3.5–5.0)
BUN: 16 mg/dL (ref 6–20)
CALCIUM: 8.4 mg/dL — AB (ref 8.9–10.3)
CO2: 29 mmol/L (ref 22–32)
CREATININE: 1.29 mg/dL — AB (ref 0.61–1.24)
Chloride: 106 mmol/L (ref 101–111)
GFR calc non Af Amer: 53 mL/min — ABNORMAL LOW (ref >60)
Glucose, Bld: 93 mg/dL (ref 65–99)
Potassium: 3.1 mmol/L — ABNORMAL LOW (ref 3.5–5.1)
SODIUM: 140 mmol/L (ref 135–145)
TOTAL PROTEIN: 6 g/dL — AB (ref 6.5–8.1)
Total Bilirubin: 1 mg/dL (ref 0.3–1.2)

## 2017-04-05 LAB — LIPID PANEL
CHOLESTEROL: 122 mg/dL (ref 0–200)
HDL: 21 mg/dL — AB (ref >40)
LDL Cholesterol: 91 mg/dL (ref 0–99)
TRIGLYCERIDES: 52 mg/dL (ref <150)
Total CHOL/HDL Ratio: 5.8 RATIO
VLDL: 10 mg/dL (ref 0–40)

## 2017-04-05 LAB — CBC
HCT: 34.9 % — ABNORMAL LOW (ref 39.0–52.0)
HEMOGLOBIN: 11.1 g/dL — AB (ref 13.0–17.0)
MCH: 20.9 pg — ABNORMAL LOW (ref 26.0–34.0)
MCHC: 31.8 g/dL (ref 30.0–36.0)
MCV: 65.7 fL — ABNORMAL LOW (ref 78.0–100.0)
PLATELETS: 125 10*3/uL — AB (ref 150–400)
RBC: 5.31 MIL/uL (ref 4.22–5.81)
RDW: 17.6 % — ABNORMAL HIGH (ref 11.5–15.5)
WBC: 6.7 10*3/uL (ref 4.0–10.5)

## 2017-04-05 LAB — GLUCOSE, CAPILLARY
GLUCOSE-CAPILLARY: 112 mg/dL — AB (ref 65–99)
GLUCOSE-CAPILLARY: 146 mg/dL — AB (ref 65–99)
GLUCOSE-CAPILLARY: 121 mg/dL — AB (ref 65–99)
Glucose-Capillary: 91 mg/dL (ref 65–99)

## 2017-04-05 LAB — TSH: TSH: 0.856 u[IU]/mL (ref 0.350–4.500)

## 2017-04-05 MED ORDER — ISOSORBIDE MONONITRATE ER 60 MG PO TB24
60.0000 mg | ORAL_TABLET | Freq: Every day | ORAL | Status: DC
  Administered 2017-04-05 – 2017-04-06 (×2): 60 mg via ORAL
  Filled 2017-04-05 (×2): qty 1

## 2017-04-05 MED ORDER — CALCIUM CARBONATE ANTACID 500 MG PO CHEW
1.0000 | CHEWABLE_TABLET | Freq: Three times a day (TID) | ORAL | Status: DC | PRN
  Filled 2017-04-05: qty 1

## 2017-04-05 MED ORDER — ENSURE ENLIVE PO LIQD
237.0000 mL | Freq: Two times a day (BID) | ORAL | Status: DC
  Administered 2017-04-06: 237 mL via ORAL

## 2017-04-05 NOTE — Evaluation (Signed)
Physical Therapy Evaluation Patient Details Name: [name] MRN: [id number] DOB: [birth year] Today's Date: 04/05/2017   History of Present Illness  Pt is a 76 y.o. male who presented to the ED with one-week history intermittent chest pain and shortness of breath. He had run out of majority of medications. PMH significant for CVA with L-sided deficits, coronary disease, history of dilated aortic root, atrial D, HTN poor control secondary to intermittent noncompliance, paroxysmal atrial fibrillation on anticoagulation, prediabetes. Carotid artery disease. Pt with worsening L sided weakness and difficulty ambulating.   Clinical Impression  Pt presenting with problem above and deficits below. PTA, pt reports he was walking with a cane and was living alone. Pt with L sided weakness at baseline secondary to CVA and presents with cognitive deficits at baseline. Upon eval, pt very unsteady and required min to mod A for basic mobility. Discussed need for SNF with pt and friend, however, pt's friend reports she is an Therapist, sports and is looking to be the caregiver for pt pending insurance approval. Feel SNF would be safest d/c location at this time, however, SNF was adamantly refused. Will need maximal HH services if d/c'ing home. Will continue to follow acutely to maximize functional mobility independence and safety.     Follow Up Recommendations Home health PT;Supervision/Assistance - 24 hour [facility])    Equipment Recommendations  Rolling walker with 5" wheels;3in1 (PT)    Recommendations for Other Services       Precautions / Restrictions Precautions Precautions: Fall Restrictions Weight Bearing Restrictions: No      Mobility  Bed Mobility Overal bed mobility: Needs Assistance Bed Mobility: Sit to Supine       Sit to supine: Min guard   General bed mobility comments: Min guard for safety   Transfers Overall transfer level: Needs assistance Equipment used: Rolling walker (2  wheeled) Transfers: Sit to/from Stand Sit to Stand: Mod assist;+2 physical assistance         General transfer comment: Mod A +2 for lift assist and steadying. Pt with posterior lean upon standing and required assist and verbal cues to stand upright.   Ambulation/Gait Ambulation/Gait assistance: Min assist;Mod assist Ambulation Distance (Feet): 15 Feet Assistive device: Rolling walker (2 wheeled) Gait Pattern/deviations: Step-through pattern;Decreased stride length;Shuffle Gait velocity: Decreased Gait velocity interpretation: Below normal speed for age/gender General Gait Details: Slow, very unsteady gait; min to mod A for steadying. Verbal cues for sequencing with RW. shuffle gait noted LLE>RLE and required cues for appropriate step height. Cues for upright posture and to look ahead as well.   Stairs            Wheelchair Mobility    Modified Rankin (Stroke Patients Only)       Balance Overall balance assessment: Needs assistance Sitting-balance support: No upper extremity supported;Feet supported Sitting balance-Leahy Scale: Fair   Postural control: Posterior lean Standing balance support: Bilateral upper extremity supported;During functional activity Standing balance-Leahy Scale: Poor Standing balance comment: Reliant on RW and external support for stability.                              Pertinent Vitals/Pain Pain Assessment: Faces Faces Pain Scale: Hurts a little bit Pain Location: stomach  Pain Descriptors / Indicators: Discomfort (reports gas pain ) Pain Intervention(s): Limited activity within patient's tolerance;Monitored during session;Repositioned    Home Living Family/patient expects to be discharged to:: Private residence Living Arrangements: Alone Available Help at Discharge: Friend(s);Available  PRN/intermittently Type of Home: House Home Access: Stairs to enter Entrance Stairs-Rails: Psychiatric nurse of Steps:  4 Home Layout: One level Home Equipment: Cane - single point;Walker - standard;Shower seat Additional Comments: Friend reports the cane is wood and doesn't have a tip.     Prior Function Level of Independence: Independent with assistive device(s)         Comments: Reports he was using cane and was able to cook and clean      Hand Dominance   Dominant Hand: Right    Extremity/Trunk Assessment   Upper Extremity Assessment Upper Extremity Assessment: Defer to OT evaluation    Lower Extremity Assessment Lower Extremity Assessment: LLE deficits/detail;Generalized weakness LLE Deficits / Details: LLE grossly 4-/5; L sided weakness at baseline secondary to CVA    Cervical / Trunk Assessment Cervical / Trunk Assessment: Kyphotic  Communication   Communication: Expressive difficulties (at baseline )  Cognition Arousal/Alertness: Awake/alert Behavior During Therapy: WFL for tasks assessed/performed Overall Cognitive Status: History of cognitive impairments - at baseline                                 General Comments: Previous CVA       General Comments General comments (skin integrity, edema, etc.): Pt's friend present during session. Educated about current needs for post acute rehab, however, friend reports she is an Therapist, sports and is trying to get insurance to approve her to be a caregiver for the pt. Pt's friend reports pt does not want to go SNF, however, pt never verbalized that.     Exercises     Assessment/Plan    PT Assessment Patient needs continued PT services  PT Problem List Decreased strength;Decreased balance;Decreased mobility;Decreased cognition;Decreased knowledge of use of DME;Decreased safety awareness;Decreased knowledge of precautions       PT Treatment Interventions DME instruction;Gait training;Stair training;Functional mobility training;Therapeutic activities;Therapeutic exercise;Balance training;Neuromuscular re-education;Cognitive  remediation;Patient/family education    PT Goals (Current goals can be found in the Care Plan section)  Acute Rehab PT Goals Patient Stated Goal: none stated  PT Goal Formulation: With patient Time For Goal Achievement: 04/19/17 Potential to Achieve Goals: Good    Frequency Min 3X/week   Barriers to discharge Decreased caregiver support Currently lives alone     Co-evaluation PT/OT/SLP Co-Evaluation/Treatment: Yes Reason for Co-Treatment: Complexity of the patient's impairments (multi-system involvement);For patient/therapist safety;To address functional/ADL transfers PT goals addressed during session: Mobility/safety with mobility;Balance;Proper use of DME         AM-PAC PT "6 Clicks" Daily Activity  Outcome Measure Difficulty turning over in bed (including adjusting bedclothes, sheets and blankets)?: A Little Difficulty moving from lying on back to sitting on the side of the bed? : Unable Difficulty sitting down on and standing up from a chair with arms (e.g., wheelchair, bedside commode, etc,.)?: Unable Help needed moving to and from a bed to chair (including a wheelchair)?: A Lot Help needed walking in hospital room?: A Lot Help needed climbing 3-5 steps with a railing? : A Lot 6 Click Score: 11    End of Session Equipment Utilized During Treatment: Gait belt Activity Tolerance: Patient tolerated treatment well Patient left: in bed;with call bell/phone within reach;with bed alarm set;with family/visitor present Nurse Communication: Mobility status PT Visit Diagnosis: Unsteadiness on feet (R26.81);Hemiplegia and hemiparesis;Difficulty in walking, not elsewhere classified (R26.2);Muscle weakness (generalized) (M62.81) Hemiplegia - Right/Left: Left Hemiplegia - dominant/non-dominant: Non-dominant Hemiplegia - caused by: Cerebral  infarction    Time: 9450-3888 PT Time Calculation (min) (ACUTE ONLY): 33 min   Charges:   PT Evaluation $PT Eval Moderate Complexity: 1  Mod     PT G Codes:        Leighton Ruff, PT, DPT  Acute Rehabilitation Services  Pager: [phone]   Rudean Hitt 04/05/2017, 2:37 PM

## 2017-04-05 NOTE — Care Management Note (Signed)
Case Management Note  Patient Details  Name: [name] MRN: [id number] Date of Birth: 1941-07-12  Subjective/Objective:      Atrial Fib             Action/Plan: Patient lives at home alone; PCP: Biagio Borg, MD; has private insurance with [facility] with prescription drug coverage; awaiting on PT/ OT eval for disposition needs. CM will continue to follow for DCP   Expected Discharge Date:     Possibly 04/08/2017             Expected Discharge Plan:  Home/Self Care  In-House Referral:   [facility]  Discharge planning Services  CM Consult  Status of Service:  In process, will continue to follow  Sherrilyn Rist [phone] 04/05/2017, 8:48 AM

## 2017-04-05 NOTE — Discharge Instructions (Signed)
Information on my medicine - ELIQUIS (apixaban)  This medication education was reviewed with me or my healthcare representative as part of my discharge preparation.  The pharmacist that spoke with me during my hospital stay was:  Tomasita Morrow, [facility]  Why was Eliquis prescribed for you? Eliquis was prescribed for you to reduce the risk of forming blood clots that can cause a stroke if you have a medical condition called atrial fibrillation (a type of irregular heartbeat) OR to reduce the risk of a blood clots forming after orthopedic surgery.  What do You need to know about Eliquis ? Take your Eliquis TWICE DAILY - one tablet in the morning and one tablet in the evening with or without food.  It would be best to take the doses about the same time each day.  If you have difficulty swallowing the tablet whole please discuss with your pharmacist how to take the medication safely.  Take Eliquis exactly as prescribed by your doctor and DO NOT stop taking Eliquis without talking to the doctor who prescribed the medication.  Stopping may increase your risk of developing a new clot or stroke.  Refill your prescription before you run out.  After discharge, you should have regular check-up appointments with your healthcare provider that is prescribing your Eliquis.  In the future your dose may need to be changed if your kidney function or weight changes by a significant amount or as you get older.  What do you do if you miss a dose? If you miss a dose, take it as soon as you remember on the same day and resume taking twice daily.  Do not take more than one dose of ELIQUIS at the same time.  Important Safety Information A possible side effect of Eliquis is bleeding. You should call your healthcare provider right away if you experience any of the following: ? Bleeding from an injury or your nose that does not stop. ? Unusual colored urine (red or dark brown) or unusual colored stools (red or  black). ? Unusual bruising for unknown reasons. ? A serious fall or if you hit your head (even if there is no bleeding).  Some medicines may interact with Eliquis and might increase your risk of bleeding or clotting while on Eliquis. To help avoid this, consult your healthcare provider or pharmacist prior to using any new prescription or non-prescription medications, including herbals, vitamins, non-steroidal anti-inflammatory drugs (NSAIDs) and supplements.  This website has more information on Eliquis (apixaban): [email or web address].

## 2017-04-05 NOTE — Evaluation (Signed)
Occupational Therapy Evaluation Patient Details Name: [name] MRN: [id number] DOB: 13-Feb-1941 Today's Date: 04/05/2017    History of Present Illness Pt is a 76 y.o. male who presented to the ED with one-week history intermittent chest pain and shortness of breath. He had run out of majority of medications. PMH significant for CVA with L-sided deficits, coronary disease, history of dilated aortic root, atrial D, HTN poor control secondary to intermittent noncompliance, paroxysmal atrial fibrillation on anticoagulation, prediabetes. Carotid artery disease. Pt with worsening L sided weakness and difficulty ambulating.    Clinical Impression   PTA, pt was living alone and reports independence with cane for ADL and functional mobility. He reports he was cooking and cleaning independently as well. Pt currently requires up to mod +2 assist for safety with simulated toilet transfers, min-mod assist for standing ADL tasks, min assist for UB ADL, and mod assist for LB ADL. He presents with generalized weakness and poor balance impacting his ability to participate with ADL and functional mobility at PLOF. Feel pt would best benefit from SNF level rehabilitation in order to maximize return to independence. However, on discussion of this pt's friend/caregiver quickly reported that pt does not want to go to SNF and she is working with insurance to be his personal caregiver as she is a Marine scientist. Despite best attempts, unable to obtain pt's opinion concerning this. If not going SNF, recommend home health OT services with home health aide post-acute D/C as well as 24 hour assistance. OT will continue to follow while admitted.     Follow Up Recommendations  Supervision/Assistance - 24 hour;Home health OT    Equipment Recommendations  3 in 1 bedside commode    Recommendations for Other Services       Precautions / Restrictions Precautions Precautions: Fall Restrictions Weight Bearing Restrictions: No       Mobility Bed Mobility Overal bed mobility: Needs Assistance Bed Mobility: Sit to Supine       Sit to supine: Min guard   General bed mobility comments: Min guard for safety   Transfers Overall transfer level: Needs assistance Equipment used: Rolling walker (2 wheeled) Transfers: Sit to/from Stand Sit to Stand: Mod assist;+2 physical assistance         General transfer comment: Mod A +2 for lift assist and steadying. Pt with posterior lean upon standing and required assist and verbal cues to stand upright.     Balance Overall balance assessment: Needs assistance Sitting-balance support: No upper extremity supported;Feet supported Sitting balance-Leahy Scale: Fair   Postural control: Posterior lean Standing balance support: Bilateral upper extremity supported;During functional activity Standing balance-Leahy Scale: Poor Standing balance comment: Reliant on RW and external support for stability.                            ADL either performed or assessed with clinical judgement   ADL Overall ADL's : Needs assistance/impaired Eating/Feeding: Supervision/ safety;Sitting   Grooming: Minimal assistance;Standing;Moderate assistance Grooming Details (indicate cue type and reason): Fluctuating min to mod assist for balance. Cues for upright positioning.  Upper Body Bathing: Minimal assistance;Sitting   Lower Body Bathing: Moderate assistance;Sit to/from stand   Upper Body Dressing : Minimal assistance;Sitting   Lower Body Dressing: Moderate assistance;Sit to/from stand   Toilet Transfer: Moderate assistance;Ambulation;RW;Cueing for safety;+2 for safety/equipment Toilet Transfer Details (indicate cue type and reason): Up to mod assist for stability when ambulating. Cues for safe use of RW.  Toileting- Clothing  Manipulation and Hygiene: Moderate assistance;Sit to/from stand       Functional mobility during ADLs: Moderate assistance;Rolling walker;+2 for  safety/equipment General ADL Comments: +2 assist for safety. Pt with decreased safety maneuvering RW at times. Pt's friend present during session and answering questions for him at times. Friend consistently reminding pt that different staff have different roles and strongly encouraging him not to ask questions about medical status of therapy staff.      Vision Baseline Vision/History: Wears glasses Wears Glasses: At all times Patient Visual Report: No change from baseline Vision Assessment?: No apparent visual deficits Additional Comments: Wearing glasses during session. Able to read self-care bottles.      Perception     Praxis      Pertinent Vitals/Pain Pain Assessment: Faces Faces Pain Scale: Hurts a little bit Pain Location: stomach  Pain Descriptors / Indicators: Discomfort (report gas pain) Pain Intervention(s): Limited activity within patient's tolerance;Monitored during session;Repositioned     Hand Dominance Right   Extremity/Trunk Assessment Upper Extremity Assessment Upper Extremity Assessment: LUE deficits/detail;Generalized weakness LUE Deficits / Details: Decreased strength as compared to R with grossly 3+/5 strength. History of L sided weakness due to CVA.   Lower Extremity Assessment Lower Extremity Assessment: Defer to PT evaluation LLE Deficits / Details: LLE grossly 4-/5; L sided weakness at baseline secondary to CVA   Cervical / Trunk Assessment Cervical / Trunk Assessment: Kyphotic   Communication Communication Communication: Expressive difficulties (at baseline)   Cognition Arousal/Alertness: Awake/alert Behavior During Therapy: WFL for tasks assessed/performed Overall Cognitive Status: History of cognitive impairments - at baseline                                 General Comments: Previous CVA   General Comments  Discussed with pt and his friend/caregiver concerning recommendation for post-acute rehabilitation. Pt's friend quickly  reports that the plan is for her to be his caregiver as she is a Marine scientist and she is working on Print production planner authorization to work as his caregiver. She did not allow pt the opportunity to state his preference but reported that pt does not want to go to SNF. Despite therapists' best efforts to discuss solely with pt, friend would not allow the opportunity for pt to respond.     Exercises     Shoulder Instructions      Home Living Family/patient expects to be discharged to:: Private residence Living Arrangements: Alone Available Help at Discharge: Friend(s);Available PRN/intermittently Type of Home: House Home Access: Stairs to enter [employer] of Steps: 4 Entrance Stairs-Rails: Right;Left Home Layout: One level     Bathroom Shower/Tub: Teacher, early years/pre: Standard     Home Equipment: Cane - single point;Walker - standard;Shower seat   Additional Comments: Friend reports the cane is wood and doesn't have a tip.       Prior Functioning/Environment Level of Independence: Independent with assistive device(s)        Comments: Reports he was using cane and was able to cook and clean         OT Problem List: Decreased strength;Decreased activity tolerance;Impaired balance (sitting and/or standing);Decreased cognition;Decreased safety awareness;Decreased knowledge of use of DME or AE;Decreased knowledge of precautions      OT Treatment/Interventions: Self-care/ADL training;Therapeutic exercise;DME and/or AE instruction;Therapeutic activities;Patient/family education;Balance training    OT Goals(Current goals can be found in the care plan section) Acute Rehab OT Goals Patient Stated Goal:  none stated  OT Goal Formulation: With patient Time For Goal Achievement: 04/19/17 Potential to Achieve Goals: Good  OT Frequency: Min 2X/week   Barriers to D/C:            Co-evaluation PT/OT/SLP Co-Evaluation/Treatment: Yes Reason for Co-Treatment:  Complexity of the patient's impairments (multi-system involvement);For patient/therapist safety;To address functional/ADL transfers PT goals addressed during session: Mobility/safety with mobility;Balance;Proper use of DME OT goals addressed during session: ADL's and self-care      AM-PAC PT "6 Clicks" Daily Activity     Outcome Measure Help from another person eating meals?: A Little Help from another person taking care of personal grooming?: A Lot Help from another person toileting, which includes using toliet, bedpan, or urinal?: A Lot Help from another person bathing (including washing, rinsing, drying)?: A Lot Help from another person to put on and taking off regular upper body clothing?: A Little Help from another person to put on and taking off regular lower body clothing?: A Lot 6 Click Score: 14   End of Session Equipment Utilized During Treatment: Gait belt;Rolling walker Nurse Communication: Mobility status  Activity Tolerance: Patient tolerated treatment well Patient left: in bed;with call bell/phone within reach  OT Visit Diagnosis: Unsteadiness on feet (R26.81);Muscle weakness (generalized) (M62.81);Hemiplegia and hemiparesis Hemiplegia - Right/Left: Left                Time: 1340-1407 OT Time Calculation (min): 27 min Charges:  OT General Charges $OT Visit: 1 Visit OT Evaluation $OT Eval Moderate Complexity: 1 Mod G-Codes:     Norman Herrlich, MS OTR/L  Pager: [place] A Sarkis Rhines 04/05/2017, 5:03 PM

## 2017-04-05 NOTE — Progress Notes (Signed)
PROGRESS NOTE    [name]  JXB:[id number] DOB: 01/27/1941 DOA: 04/03/2017 PCP: Biagio Borg, MD   Brief Narrative: 76 year old male with history of coronary artery disease, stroke with left-sided residual weakness, slurred space, hypertension, paroxysmal atrial fibrillation on anticoagulation presented with a week of intermittent chest pain, shortness of breath.  Assessment & Plan:   #Chest pain likely in the setting of hypertensive emergency and possible acute diastolic congestive heart failure: -Plan to continue IV Lasix today and switched to oral tomorrow. Follow-up echocardiogram. Patient has no chest pain. Patient may get outpatient cardiac stress test. Cardiology consult appreciated -Continue aspirin, Lipitor, Coreg, Lasix, Imdur and lisinopril. -PT OT evaluation.  #Paroxysmal atrial fibrillation: Continue Coreg  And Eliquis.  #Hypertensive emergency on admission: Likely due to noncompliance of the medication. Continue current medication. Blood pressure is better controlled. Continue to monitor.  #Hypokalemia: Replete potassium chloride. Monitor labs  #Chronic kidney disease is stage III: Serum creatinine level around baseline. Avoid nephrotoxins. On IV Lasix.  #History of a stroke with a left hemiplegia: No acute finding. PT OT evaluation.  #History of peripheral vascular disease #Chronic noncompliance of the medication: Patient was educated about the importance of medication adherence.  DVT prophylaxis:Eliquis Code Status: Full code Family Communication: No family at bedside Disposition Plan: Likely discharge home in 1-2 days    Consultants:   Cardiology  Procedures: Pending echocardiogram Antimicrobials: None  Subjective: Seen and examined at bedside. Feels weak. Denied headache, dizziness, nausea vomiting chest pain or shortness of breath. Has mild cough.  Objective: Vitals:   04/05/17 0016 04/05/17 0510 04/05/17 0751 04/05/17 1226  BP: 130/79 (!)  157/81 (!) 161/82 101/67  Pulse: 60 66 69 73  Resp: 20 20 20 20   Temp: 99.5 F (37.5 C) 98.2 F (36.8 C) 98.2 F (36.8 C) 97.9 F (36.6 C)  TempSrc: Oral Oral Oral Oral  SpO2: 96% 100% 96% 99%  Weight:  70.9 kg (156 lb 4.8 oz)    Height:        Intake/Output Summary (Last 24 hours) at 04/05/17 1315 Last data filed at 04/05/17 0800  Gross per 24 hour  Intake              240 ml  Output             2050 ml  Net            -1810 ml   Filed Weights   04/04/17 1429 04/05/17 0510  Weight: 70.4 kg (155 lb 3 oz) 70.9 kg (156 lb 4.8 oz)    Examination:  General exam: Appears calm and comfortable  Respiratory system: Bibasal crackles, respiratory effort normal Cardiovascular system: S1 & S2 heard, RRR.  Lower extremity edema present Gastrointestinal system: Abdomen is nondistended, soft and nontender. Normal bowel sounds heard. Central nervous system: Alert and oriented. No focal neurological deficits. Extremities: Left lower extremity weakness Skin: No rashes, lesions or ulcers Psychiatry: Judgement and insight appear normal. Mood & affect appropriate.     Data Reviewed: I have personally reviewed following labs and imaging studies  CBC:  Recent Labs Lab 04/03/17 1605 04/05/17 0352  WBC 5.1 6.7  HGB 11.2* 11.1*  HCT 35.1* 34.9*  MCV 65.4* 65.7*  PLT 169 130*   Basic Metabolic Panel:  Recent Labs Lab 04/03/17 1605 04/04/17 0051 04/04/17 0620 04/05/17 0352  NA 141  --   --  140  K 3.9  --   --  3.1*  CL 110  --   --  106  CO2 22  --   --  29  GLUCOSE 137*  --   --  93  BUN 23*  --   --  16  CREATININE 1.49*  --   --  1.29*  CALCIUM 8.4*  --   --  8.4*  MG  --  1.8  --   --   PHOS  --   --  2.7  --    GFR: Estimated Creatinine Clearance: 44.6 mL/min (A) (by C-G formula based on SCr of 1.29 mg/dL (H)). Liver Function Tests:  Recent Labs Lab 04/04/17 0051 04/05/17 0352  AST 46* 42*  ALT 70* 59  ALKPHOS 117 99  BILITOT 0.8 1.0  PROT 6.5 6.0*    ALBUMIN 2.6* 2.4*   No results for input(s): LIPASE, AMYLASE in the last 168 hours. No results for input(s): AMMONIA in the last 168 hours. Coagulation Profile:  Recent Labs Lab 04/04/17 0620  INR 1.35   Cardiac Enzymes:  Recent Labs Lab 04/03/17 1845 04/04/17 0051 04/04/17 0620  TROPONINI 0.03* 0.03* 0.04*   BNP (last 3 results)  Recent Labs  01/28/17 1526  PROBNP 1,033.0*   HbA1C:  Recent Labs  04/04/17 1306  HGBA1C 6.5*   CBG:  Recent Labs Lab 04/04/17 1257 04/04/17 1621 04/04/17 2113 04/05/17 0729 04/05/17 1139  GLUCAP 128* 109* 172* 112* 146*   Lipid Profile:  Recent Labs  04/04/17 1306 04/05/17 0352  CHOL 144 122  HDL 23* 21*  LDLCALC 111* 91  TRIG 50 52  CHOLHDL 6.3 5.8   Thyroid Function Tests:  Recent Labs  04/05/17 0352  TSH 0.856   Anemia Panel:  Recent Labs  04/04/17 0051 04/04/17 0620  VITAMINB12  --  776  FOLATE 14.4  --   FERRITIN  --  84  TIBC  --  396  IRON  --  66  RETICCTPCT  --  1.6   Sepsis Labs: No results for input(s): PROCALCITON, LATICACIDVEN in the last 168 hours.  No results found for this or any previous visit (from the past 240 hour(s)).       Radiology Studies: Dg Chest 2 View  Result Date: 04/03/2017 CLINICAL DATA:  Shortness of breath EXAM: CHEST  2 VIEW COMPARISON:  Dec 31, 2016 FINDINGS: The mediastinal contour is normal. Heart size is enlarged. There is no focal infiltrate, pulmonary edema. Minimal posterior pleural effusions are identified. The soft tissue and osseous structures are stable. IMPRESSION: Cardiomegaly. No pulmonary edema or focal pneumonia. Minimal bilateral pleural effusions. Electronically Signed   By: Abelardo Diesel M.D.   On: 04/03/2017 17:03   Mr Brain Wo Contrast  Result Date: 04/04/2017 CLINICAL DATA:  76 y/o  M; chest pain and severe hypertension. EXAM: MRI HEAD WITHOUT CONTRAST TECHNIQUE: Multiplanar, multiecho pulse sequences of the brain and surrounding structures  were obtained without intravenous contrast. COMPARISON:  10/11/2015 MRI of the head. FINDINGS: Brain: No acute infarction, hemorrhage, hydrocephalus, extra-axial collection or mass lesion. Stable patchy nonspecific foci of T2 FLAIR hyperintense signal abnormality in subcortical and periventricular white matter is compatible with moderate chronic microvascular ischemic changes for age. Moderate brain parenchymal volume loss. Stable left frontal, right mid corona radiata, and left posterior corona radiata chronic lacunar infarctions. Few punctate foci of susceptibility hypointensity within the right parietal lobe compatible with hemosiderin deposition of chronic microhemorrhage. Vascular: Normal flow voids. Skull and upper cervical spine: Normal marrow signal. Sinuses/Orbits: Mild left maxillary sinus mucosal thickening. No abnormal signal of mastoid air  cells. Orbits are unremarkable. Other: None. IMPRESSION: 1. No acute intracranial abnormality identified. No findings of PRES. 2. Stable moderate chronic microvascular ischemic changes and mild parenchymal volume loss of the brain. 3. Mild left maxillary sinus disease. Electronically Signed   By: Kristine Garbe M.D.   On: 04/04/2017 00:13        Scheduled Meds: . apixaban  5 mg Oral BID  . aspirin EC  81 mg Oral Daily  . atorvastatin  80 mg Oral q1800  . carvedilol  12.5 mg Oral BID WC  . furosemide  40 mg Intravenous Q12H  . insulin aspart  0-9 Units Subcutaneous TID WC  . isosorbide mononitrate  60 mg Oral Daily  . lisinopril  2.5 mg Oral Daily   Continuous Infusions:   LOS: 2 days    Kalilah Barua Tanna Furry, MD [facility] Pager [phone]  If 7PM-7AM, please contact night-coverage [email or web address] Password [email or web address] 04/05/2017, 1:15 PM

## 2017-04-05 NOTE — Progress Notes (Signed)
Pt requesting medication for indigestion, paged MD

## 2017-04-05 NOTE — Progress Notes (Signed)
*  PRELIMINARY RESULTS* Echocardiogram 2D Echocardiogram has been performed.  Samuel Germany 04/05/2017, 3:02 PM

## 2017-04-05 NOTE — Evaluation (Signed)
Speech Language Pathology Evaluation Patient Details Name: [name] MRN: [id number] DOB: 03/07/1941 Today's Date: 04/05/2017 Time: 1020-1039 SLP Time Calculation (min) (ACUTE ONLY): 19 min  Problem List:  Patient Active Problem List   Diagnosis Date Noted  . 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 Cardiomegaly 04/03/2017  . History of stroke 04/03/2017  . Hypertensive heart disease with congestive heart failure ([place]) 04/03/2017  . Gait disorder 01/28/2017  . Systolic congestive heart failure ([place]) 01/13/2017  . Insomnia 12/06/2016  . Left-sided weakness 12/03/2016  . Chronic anticoagulation 12/10/2015  . Sepsis secondary to UTI ([place]) 11/05/2015  . UTI (lower urinary tract infection) 11/05/2015  . CAD in native artery 11/05/2015  . Left hemiplegia ([place]) 11/05/2015  . Sinus tachycardia   . Cerebral infarction due to thrombosis of right posterior cerebral artery ([place])   . Labile blood pressure   . Right-sided cerebrovascular accident (CVA) ([place])   . Paroxysmal atrial fibrillation (H[facility])   . Carotid artery stenosis   . S/P carotid endarterectomy   . Coronary artery disease involving native coronary artery of native heart without angina pectoris   . Acute blood loss anemia   . Near syncope   . Prediabetes 10/08/2015  . Right cavernous carotid stenosis   . Carotid stenosis   . Acute ischemic stroke ([place]) 10/07/2015  . Chest pain 06/19/2015  . DOE (dyspnea on exertion) 06/19/2015  . Hypertensive urgency 06/19/2015  . Bilateral arm pain 06/26/2014  . 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 Impingement syndrome of left shoulder 01/20/2013  . Atrial fibrillation ([place]) 04/18/2012  . Leg pain 01/28/2012  . Dizziness and giddiness 11/19/2011  . Dehydration 11/19/2011  . CKD (chronic kidney disease), stage III 11/19/2011  . HTN (hypertension) 11/19/2011  . Dyslipidemia 11/19/2011  . Erectile dysfunction 08/20/2011  . Nocturia  08/20/2011  . Encounter for preventative adult health care exam with abnormal findings 08/15/2011  . Anemia, iron deficiency 08/15/2011  . History of pneumonia 08/15/2011  . CAD (coronary artery disease) 08/15/2011  . History of MI (myocardial infarction) 08/15/2011  . Dilated aortic root ([place]) 08/15/2011  . Syncope 07/16/2011  . Claudication ([place]) 07/16/2011  . BPH (benign prostatic hyperplasia) 07/16/2011  . Rash 04/29/2011  . CVA (cerebral infarction) 03/31/2011  . Inguinal hernia 03/31/2011   Past Medical History:  Past Medical History:  Diagnosis Date  . Anemia, iron deficiency 08/15/2011  . CAD (coronary artery disease)    cath in 1/08: EF 60%, mild dilated Ao root, oD2 30%, LAD 20-30%.  . Dilated aortic root ([place]) 08/15/2011  . Dyslipidemia   . Dysrhythmia   . History of MI (myocardial infarction) 08/15/2011  . History of pneumonia 08/15/2011  . History of stroke   . Hyperlipidemia   . Hypertension    echocardiogram 6/10: Moderate LVH, EF 55-65%, mild, mild MR, MAC, mild LAE, PASP 32  . Myocardial infarction ([place])   . Paroxysmal atrial fibrillation (HCC)   . Prediabetes 10/08/2015  . Preoperative cardiovascular examination 10/09/2015  . Shortness of breath dyspnea   . Stroke ([place]) 10/2011  . Systolic congestive heart failure ([place]) 01/13/2017   Past Surgical History:  Past Surgical History:  Procedure Laterality Date  . CARDIAC CATHETERIZATION    . ENDARTERECTOMY Right 10/10/2015   Procedure: ENDARTERECTOMY CAROTID-RIGHT;  Surgeon: Serafina Mitchell, MD;  Location: [facility] OR;  Service: Vascular;  Laterality: Right;  . IR RADIOLOGIST EVAL & MGMT  02/11/2017  . IR RADIOLOGIST EVAL &  MGMT  03/24/2017   HPI:  [name] a 76 y.o.malewith medical history significant of coronary disease, history of dilated aortic root, atrial D, history of CVA, dysarthria, residual left side weakness, HTN poor control secondary to intermittent noncompliance, paroxysmal atrial fibrillation on  anticoagulation, prediabetes, carotid artery disease. Admitted after one-week history of intermittent chest pain and shortness of breath (per chart pt ran out of all his medications except Coreg. Blood pressure has been uncontrolled) and progressive weakness and pt can no longer ambulate. MRI No acute intracranial abnormality identified. No findings of PRES, atable moderate chronic microvascular ischemic changes and mild parenchymal volume loss of the brain.   Assessment / Plan / Recommendation Clinical Impression  Pt has a history of moderate dysarthria and cognitive impairments since stroke 2017 also evident in today's cognitive assessment with portions of the Cognistat. Compensatory strategies has been offered to him such as a pill box but he prefers to use the bottles and "just remember". He has a calendar which he doens't use for appointments (sometimes friend reminds him). He could benefit from ST however unlikely that he will comply with strategies/recommendations. He states he lives with "friends". He will require supervision/assist at home however what assist does he have? Recommend home health ST if he will agree. No further ST needed in this venue of care.          SLP Assessment  SLP Recommendation/Assessment: All further Speech Lanaguage Pathology  needs can be addressed in the next venue of care SLP Visit Diagnosis: Cognitive communication deficit (R41.841)    Follow Up Recommendations   (? home health)    Frequency and Duration           SLP Evaluation Cognition  Overall Cognitive Status: History of cognitive impairments - at baseline Arousal/Alertness: Awake/alert Orientation Level: Oriented to person;Oriented to place;Oriented to time Attention: Sustained Sustained Attention: Appears intact Memory: Impaired Memory Impairment: Decreased recall of new information;Decreased short term memory Awareness: Impaired Awareness Impairment: Anticipatory impairment Problem Solving:  Impaired Problem Solving Impairment: Verbal basic;Functional basic Safety/Judgment: Impaired       Comprehension  Auditory Comprehension Overall Auditory Comprehension: Appears within functional limits for tasks assessed Visual Recognition/Discrimination Discrimination: Not tested Reading Comprehension Reading Status: Not tested    Expression Expression Primary Mode of Expression: Verbal Verbal Expression Overall Verbal Expression: Appears within functional limits for tasks assessed Written Expression Dominant Hand: Right Written Expression: Not tested   Oral / Motor  Oral Motor/Sensory Function Overall Oral Motor/Sensory Function: Within functional limits Motor Speech Overall Motor Speech: Impaired at baseline Respiration: Within functional limits Phonation: Normal Resonance: Within functional limits Articulation: Within functional limitis Intelligibility: Intelligibility reduced Word: 75-100% accurate Phrase: 75-100% accurate Sentence: 75-100% accurate Conversation: 75-100% accurate Motor Planning: Witnin functional limits Motor Speech Errors: Aware Effective Techniques: Slow rate   GO                    Houston Siren 04/05/2017, 10:57 AM   Orbie Pyo Colvin Caroli.Ed [employer] [phone]

## 2017-04-05 NOTE — Progress Notes (Addendum)
Progress Note  Patient Name: [name] Date of Encounter: 04/05/2017  Primary Cardiologist: Formerly Dr Lia Foyer  Subjective   The patient feels significantly better today.   Inpatient Medications    Scheduled Meds: . apixaban  5 mg Oral BID  . aspirin EC  81 mg Oral Daily  . atorvastatin  80 mg Oral q1800  . carvedilol  12.5 mg Oral BID WC  . furosemide  40 mg Intravenous Q12H  . insulin aspart  0-9 Units Subcutaneous TID WC  . isosorbide mononitrate  30 mg Oral Daily  . lisinopril  2.5 mg Oral Daily   Continuous Infusions:  PRN Meds: acetaminophen **OR** [DISCONTINUED] acetaminophen (TYLENOL) oral liquid 160 mg/5 mL **OR** acetaminophen, hydrALAZINE, senna-docusate   Vital Signs    Vitals:   04/04/17 1933 04/05/17 0016 04/05/17 0510 04/05/17 0751  BP: 138/80 130/79 (!) 157/81 (!) 161/82  Pulse: 66 60 66 69  Resp: 18 20 20 20   Temp: 97.6 F (36.4 C) 99.5 F (37.5 C) 98.2 F (36.8 C) 98.2 F (36.8 C)  TempSrc: Oral Oral Oral Oral  SpO2: 96% 96% 100% 96%  Weight:   156 lb 4.8 oz (70.9 kg)   Height:        Intake/Output Summary (Last 24 hours) at 04/05/17 0817 Last data filed at 04/05/17 0514  Gross per 24 hour  Intake                0 ml  Output             5250 ml  Net            -5250 ml   Filed Weights   04/04/17 1429 04/05/17 0510  Weight: 155 lb 3 oz (70.4 kg) 156 lb 4.8 oz (70.9 kg)   Telemetry    SR, a short run of atrial tachycardia - Personally Reviewed  ECG    SR, negative T waves in the inferolateral leads - Personally Reviewed  Physical Exam   GEN: No acute distress.   Neck: No JVD Cardiac: RRR, no murmurs, rubs, or gallops.  Respiratory: Clear to auscultation bilaterally. GI: Soft, nontender, non-distended  MS: Mild pitting edema B/L, No deformity. Neuro:  Nonfocal  Psych: Normal affect   Labs    Chemistry Recent Labs Lab 04/03/17 1605 04/04/17 0051 04/05/17 0352  NA 141  --  140  K 3.9  --  3.1*  CL 110  --   106  CO2 22  --  29  GLUCOSE 137*  --  93  BUN 23*  --  16  CREATININE 1.49*  --  1.29*  CALCIUM 8.4*  --  8.4*  PROT  --  6.5 6.0*  ALBUMIN  --  2.6* 2.4*  AST  --  46* 42*  ALT  --  70* 59  ALKPHOS  --  117 99  BILITOT  --  0.8 1.0  GFRNONAA 44*  --  53*  GFRAA 51*  --  >60  ANIONGAP 9  --  5    Hematology Recent Labs Lab 04/03/17 1605 04/04/17 0620 04/05/17 0352  WBC 5.1  --  6.7  RBC 5.37 5.78 5.31  HGB 11.2*  --  11.1*  HCT 35.1*  --  34.9*  MCV 65.4*  --  65.7*  MCH 20.9*  --  20.9*  MCHC 31.9  --  31.8  RDW 17.7*  --  17.6*  PLT 169  --  125*   Cardiac Enzymes  Recent Labs Lab 04/03/17 1845 04/04/17 0051 04/04/17 0620  TROPONINI 0.03* 0.03* 0.04*    Recent Labs Lab 04/03/17 1609  TROPIPOC 0.07    BNP Recent Labs Lab 04/03/17 1605  BNP 2,236.3*    DDimer No results for input(s): DDIMER in the last 168 hours.   Radiology    Dg Chest 2 View  Result Date: 04/03/2017 CLINICAL DATA:  Shortness of breath EXAM: CHEST  2 VIEW COMPARISON:  Dec 31, 2016 FINDINGS: The mediastinal contour is normal. Heart size is enlarged. There is no focal infiltrate, pulmonary edema. Minimal posterior pleural effusions are identified. The soft tissue and osseous structures are stable. IMPRESSION: Cardiomegaly. No pulmonary edema or focal pneumonia. Minimal bilateral pleural effusions. Electronically Signed   By: Abelardo Diesel M.D.   On: 04/03/2017 17:03   Mr Brain Wo Contrast  Result Date: 04/04/2017 CLINICAL DATA:  76 y/o  M; chest pain and severe hypertension. EXAM: MRI HEAD WITHOUT CONTRAST TECHNIQUE: Multiplanar, multiecho pulse sequences of the brain and surrounding structures were obtained without intravenous contrast. COMPARISON:  10/11/2015 MRI of the head. FINDINGS: Brain: No acute infarction, hemorrhage, hydrocephalus, extra-axial collection or mass lesion. Stable patchy nonspecific foci of T2 FLAIR hyperintense signal abnormality in subcortical and periventricular  white matter is compatible with moderate chronic microvascular ischemic changes for age. Moderate brain parenchymal volume loss. Stable left frontal, right mid corona radiata, and left posterior corona radiata chronic lacunar infarctions. Few punctate foci of susceptibility hypointensity within the right parietal lobe compatible with hemosiderin deposition of chronic microhemorrhage. Vascular: Normal flow voids. Skull and upper cervical spine: Normal marrow signal. Sinuses/Orbits: Mild left maxillary sinus mucosal thickening. No abnormal signal of mastoid air cells. Orbits are unremarkable. Other: None. IMPRESSION: 1. No acute intracranial abnormality identified. No findings of PRES. 2. Stable moderate chronic microvascular ischemic changes and mild parenchymal volume loss of the brain. 3. Mild left maxillary sinus disease. Electronically Signed   By: Kristine Garbe M.D.   On: 04/04/2017 00:13   Cardiac Studies      Patient Profile     76 y.o. male   Assessment & Plan    1. Chest Pain:  Likely related to hypertensive urgency with CHF, sec to non-compliance, now resolved with normalization of BP. Troponin is minimally elevated in light of demand ischemia. Breathing status has improved with lasix.   ECG has new ECG changes - negative T waves in the inferolateral leads, I would recommend to schedule an outpatient stress test. He has h/o non-obstructive CAD per cath in 2008.   2. Hypertensive heart disease with CHF Multifactorial due to medical non-compliance and lack of affordability per patient. He has been out of his medications intermittently for over 3 months. He is only taking two of them, but does not remember which they are. He is better controlled on hydralazine. Review of home medications.reveals that he is on isosorbide, coreg 12.5 mg BID. Would try and simplify antihypertensives as much as possible to encourage compliance. Creatinine 1.49 ->1.29. I will increase imdur to 60 mg po  daily. I would continue one more day of iv Lasix then switch to PO and plan for a discharge in the morning. Plan echocardiogram for evaluation of LV function and assistance with management.   3. PAF: No evidence of atrial fib on ER EKG or on telemetry. CHADS VASC Score of 6. Would continue Eliquis as directed. Does not appear to be taking it with normal INR.   4. Hx of CVA:  MR  of brain negative for acute intracranial abnormality or bleeding.   5. Hx of Hyperlipidemia: Was on Lipitor 80 mg but not taking. Would check fasting lipids. With elevated LFT;s will need to discuss adding statin or not during hospitalization.   Signed, Ena Dawley, MD  04/05/2017, 8:17 AM

## 2017-04-05 NOTE — Progress Notes (Signed)
Initial Nutrition Assessment  INTERVENTION:   Ensure Enlive po BID, each supplement provides 350 kcal and 20 grams of protein   NUTRITION DIAGNOSIS:   Inadequate oral intake related to  (decreased appetite) as evidenced by per patient/family report.  GOAL:   Patient will meet greater than or equal to 90% of their needs  MONITOR:   PO intake, Supplement acceptance  REASON FOR ASSESSMENT:   Consult Assessment of nutrition requirement/status  ASSESSMENT:   Pt with PMH of CVA, CAD, HTN, and medical noncompliance admitted with chest pain, hypertensive emergency, and CHF.    Per friend pt does not take care of himself. She reports that he can not afford his medications. He also does not eat well.  She reports that when she is staying with him that she gets his medications and makes sure that he eats. She travels and is not always there.  Breakfast 100% Lunch 25% - complains of nausea  Pt reports that he has had weight loss. Per chart review pt with 6.5% weight loss in the last 3 months. Per friend weight loss likely due to medical noncompliance and not eating well.  Pt is somewhat difficult to understand. He is unable to tell me what he is eating at home. He has had ensure before and is willing to drink them while here.   Medications reviewed and include: lasix, novolog Labs reviewed: K+ 3.1 (L) CBG's: [phone] Nutrition-Focused physical exam completed. Findings are no fat depletion, no muscle depletion, and mild edema.    Diet Order:  Diet Heart Room service appropriate? Yes; Fluid consistency: Thin  Skin:  Reviewed, no issues  Last BM:  9/3  Height:   Ht Readings from Last 1 Encounters:  04/04/17 5\' 6"  (1.676 m)    Weight:   Wt Readings from Last 1 Encounters:  04/05/17 156 lb 4.8 oz (70.9 kg)    Ideal Body Weight:     BMI:  Body mass index is 25.23 kg/m.  Estimated Nutritional Needs:   Kcal:  1700-1900  Protein:  85-100 grams  Fluid:  >/= 1.7  L/day  EDUCATION NEEDS:   Education needs addressed  Maylon Peppers RD, [place], [place] Pager [phone] After Hours Pager

## 2017-04-06 DIAGNOSIS — I10 Essential (primary) hypertension: Secondary | ICD-10-CM

## 2017-04-06 LAB — BASIC METABOLIC PANEL
ANION GAP: 6 (ref 5–15)
BUN: 23 mg/dL — ABNORMAL HIGH (ref 6–20)
CO2: 31 mmol/L (ref 22–32)
Calcium: 8.2 mg/dL — ABNORMAL LOW (ref 8.9–10.3)
Chloride: 102 mmol/L (ref 101–111)
Creatinine, Ser: 1.26 mg/dL — ABNORMAL HIGH (ref 0.61–1.24)
GFR, EST NON AFRICAN AMERICAN: 54 mL/min — AB (ref >60)
GLUCOSE: 84 mg/dL (ref 65–99)
POTASSIUM: 3.1 mmol/L — AB (ref 3.5–5.1)
Sodium: 139 mmol/L (ref 135–145)

## 2017-04-06 LAB — GLUCOSE, CAPILLARY
GLUCOSE-CAPILLARY: 86 mg/dL (ref 65–99)
GLUCOSE-CAPILLARY: 127 mg/dL — AB (ref 65–99)

## 2017-04-06 MED ORDER — ASPIRIN EC 81 MG PO TBEC: 81.0000 mg | DELAYED_RELEASE_TABLET | Freq: Every day | ORAL | 0 refills | Status: DC

## 2017-04-06 MED ORDER — CARVEDILOL 12.5 MG PO TABS: 12.5000 mg | ORAL_TABLET | Freq: Two times a day (BID) | ORAL | 0 refills | Status: DC

## 2017-04-06 MED ORDER — APIXABAN 5 MG PO TABS: 5.0000 mg | ORAL_TABLET | Freq: Two times a day (BID) | ORAL | 0 refills | Status: DC

## 2017-04-06 MED ORDER — POTASSIUM CHLORIDE CRYS ER 20 MEQ PO TBCR: 40.0000 meq | EXTENDED_RELEASE_TABLET | Freq: Every day | ORAL | 0 refills | Status: DC

## 2017-04-06 MED ORDER — LISINOPRIL 2.5 MG PO TABS: 2.5000 mg | ORAL_TABLET | Freq: Every day | ORAL | 0 refills | Status: DC

## 2017-04-06 MED ORDER — POTASSIUM CHLORIDE CRYS ER 20 MEQ PO TBCR
40.0000 meq | EXTENDED_RELEASE_TABLET | Freq: Two times a day (BID) | ORAL | Status: DC
  Administered 2017-04-06: 40 meq via ORAL
  Filled 2017-04-06: qty 2

## 2017-04-06 MED ORDER — FUROSEMIDE 40 MG PO TABS: 40.0000 mg | ORAL_TABLET | Freq: Every day | ORAL | 0 refills | Status: DC

## 2017-04-06 MED ORDER — FUROSEMIDE 40 MG PO TABS
40.0000 mg | ORAL_TABLET | Freq: Every day | ORAL | Status: DC
Start: 2017-04-07 — End: 2017-04-06

## 2017-04-06 MED ORDER — ISOSORBIDE MONONITRATE ER 60 MG PO TB24: 60.0000 mg | ORAL_TABLET | Freq: Every day | ORAL | 0 refills | Status: DC

## 2017-04-06 NOTE — Progress Notes (Signed)
Progress Note  Patient Name: [name] Date of Encounter: 04/06/2017  Primary Cardiologist: Allred/Nelson  Subjective   Patient feels well today. No chest pain or shortness of breath.  He says that he ended up here because he wasn't taking his medications right. He thought he was but he sites his low education level for his lack of understanding.   Inpatient Medications    Scheduled Meds: . apixaban  5 mg Oral BID  . aspirin EC  81 mg Oral Daily  . atorvastatin  80 mg Oral q1800  . carvedilol  12.5 mg Oral BID WC  . feeding supplement (ENSURE ENLIVE)  237 mL Oral BID BM  . furosemide  40 mg Intravenous Q12H  . insulin aspart  0-9 Units Subcutaneous TID WC  . isosorbide mononitrate  60 mg Oral Daily  . lisinopril  2.5 mg Oral Daily  . potassium chloride  40 mEq Oral BID   Continuous Infusions:  PRN Meds: acetaminophen **OR** [DISCONTINUED] acetaminophen (TYLENOL) oral liquid 160 mg/5 mL **OR** acetaminophen, calcium carbonate, hydrALAZINE, senna-docusate   Vital Signs    Vitals:   04/05/17 1654 04/05/17 2131 04/06/17 0453 04/06/17 0738  BP: (!) 147/69 (!) 148/87 (!) 161/89   Pulse: 72 77 75   Resp: 20 18    Temp: 97.8 F (36.6 C) 98 F (36.7 C) 97.9 F (36.6 C)   TempSrc: Oral Oral Oral   SpO2: 97% 94% 96%   Weight:    154 lb 3.2 oz (69.9 kg)  Height:        Intake/Output Summary (Last 24 hours) at 04/06/17 0910 Last data filed at 04/06/17 0738  Gross per 24 hour  Intake              240 ml  Output             3050 ml  Net            -2810 ml   Filed Weights   04/04/17 1429 04/05/17 0510 04/06/17 0738  Weight: 155 lb 3 oz (70.4 kg) 156 lb 4.8 oz (70.9 kg) 154 lb 3.2 oz (69.9 kg)    Telemetry    NSR in the 70's with occ PAC's - Personally Reviewed  ECG    No new tracings. - Personally Reviewed  Physical Exam   GEN: No acute distress.   Neck: Positive JVD, lying almost flat Cardiac: RRR, no murmurs, rubs, or gallops.  Respiratory: Clear to  auscultation bilaterally. GI: Soft, nontender, non-distended  MS: No edema; No deformity. Neuro:  Nonfocal  Psych: Normal affect   Labs    Chemistry Recent Labs Lab 04/03/17 1605 04/04/17 0051 04/05/17 0352 04/06/17 0630  NA 141  --  140 139  K 3.9  --  3.1* 3.1*  CL 110  --  106 102  CO2 22  --  29 31  GLUCOSE 137*  --  93 84  BUN 23*  --  16 23*  CREATININE 1.49*  --  1.29* 1.26*  CALCIUM 8.4*  --  8.4* 8.2*  PROT  --  6.5 6.0*  --   ALBUMIN  --  2.6* 2.4*  --   AST  --  46* 42*  --   ALT  --  70* 59  --   ALKPHOS  --  117 99  --   BILITOT  --  0.8 1.0  --   GFRNONAA 44*  --  53* 54*  GFRAA 51*  --  >  60 >60  ANIONGAP 9  --  5 6     Hematology Recent Labs Lab 04/03/17 1605 04/04/17 0620 04/05/17 0352  WBC 5.1  --  6.7  RBC 5.37 5.78 5.31  HGB 11.2*  --  11.1*  HCT 35.1*  --  34.9*  MCV 65.4*  --  65.7*  MCH 20.9*  --  20.9*  MCHC 31.9  --  31.8  RDW 17.7*  --  17.6*  PLT 169  --  125*    Cardiac Enzymes Recent Labs Lab 04/03/17 1845 04/04/17 0051 04/04/17 0620  TROPONINI 0.03* 0.03* 0.04*    Recent Labs Lab 04/03/17 1609  TROPIPOC 0.07     BNP Recent Labs Lab 04/03/17 1605  BNP 2,236.3*     DDimer No results for input(s): DDIMER in the last 168 hours.   Radiology    No results found.  Cardiac Studies   Echocardiogram 04/05/17 Study Conclusions  - Left ventricle: The cavity size was normal. There was severe   concentric hypertrophy. Systolic function was mildly to   moderately reduced. The estimated ejection fraction was in the   range of 40% to 45%. Moderate diffuse hypokinesis with no   identifiable regional variations. Features are consistent with a   pseudonormal left ventricular filling pattern, with concomitant   abnormal relaxation and increased filling pressure (grade 2   diastolic dysfunction). - Aortic valve: There was trivial regurgitation. - Left atrium: The atrium was mildly dilated. - Right atrium: The atrium  was mildly dilated. - Atrial septum: No defect or patent foramen ovale was identified.  Patient Profile     76 y.o. male with a hx of carotid artery stenosis,s/p right carotid endarterectomy 2017). CVA, 10/2015. difficult to control  hypertension, hyperlipidemia, CAD ( cath 1/08: EF 60%, mild dilated Ao root, oD2 30%, LAD 20-30%). , PAF (CHADS VASC Score of 6 on Eliquis), BPH, medical non-compliance, who is being seen for the evaluation of dyspnea and chest pain. Initial BP was 193/130 and as high as 225/122.  Assessment & Plan    1. Chest pain -Likely related to hypertensive urgency with CHF. No further chest pain.  -Pt has hx of non-obstructive CAD per cath in 2008 -Pt non-compliant with medications due to lack of affordability per pt along with his low education level per pt. Will re-consult CM to assist with medication management related to his understanding.  -Troponins minimally elevated at 0.04 - Breathing improved with lasix -Recommend outpatient stress test  2. Hypertensive heart disease with CHF -Pt has not been taking all of his meds due to reported inability to afford them. He has been taking his meds intermittently over the previous 3 months.  -Imdur increased to 60 mg daily -Echocardiogram done yesterday shows EF 40-45% (down from 55-60% in 10/2015) with moderate diffuse hypokinesis consistent with hypertensive heart disease and grade 2 DD.  -Pt has been receiving lasix 40 mg q12h. Will transition to oral dosing.  -Wt has been relatively unchanged 155>>156>>154 (wt at PCP office visit on 01/28/17 was 167 which was stable over the previous 2 visits) -2.3L urine output yesterday and net negative 7.9L since admission -SCr stable with mild improvements. 1.49>1.29>1.26 -Plan for discharge tomorrow with plan for  outpatient stress testing  3. Hypertension -Current meds include carvedilol 12.5 mg bid, Imdur 60 mg, lisinopril 2.5 mg daily. -BP has improved. Was a little high this  morning at 161/89. Monitor after meds.   4. Hyperlipidemia -LDL 91 on 04/05/17 -Lipitor 80  mg ordered, but pt was not taking. LFT's very mildly elevated on admission, slight improvement. -Will defer therapy to MD  5. PAF -No evidence of atrial fib on monitoring.  -CHA2DS2/VAS Stroke Risk Score is 7 (CHF, Vasc dz, HTN, Age (2), stroke (2))  -Continue Eliquis for stroke risk reduction related to afib. There is a question as to whether the patient has been taking it. Work on medication compliance.    6. Hx of CVA -MR of brain negative for acute abnormality   Signed, Daune Perch, NP  04/06/2017, 9:10 AM    Being d/c today has care giver to help with meds exam with clear lungs and S4 gallp No edema. Outpatient f/u with Dr Meda Coffee or Allred  Jenkins Rouge

## 2017-04-06 NOTE — Care Management Note (Signed)
Case Management Note  Patient Details  Name: [name] MRN: [id number] Date of Birth: 04-19-1941  Subjective/Objective:  Atrial Fib                 Action/Plan: Patient lives at home alone; has a caregiver - [name]; PCP: Biagio Borg, MD; has private insurance with [facility] with prescription drug coverage; pharmacy of choice is [facility]; patient is requesting a cane and 3:1; [place] choice offered, pt chose [employer]; Brad with [facility] called for arrangements  Expected Discharge Date:  04/06/17               Expected Discharge Plan:  Home/Self Care  In-House Referral:   [facility]  Discharge planning Services  CM Consult  Choice offered to:  Patient  DME Arranged:  3-N-1[name] DME Agency:  [place] Arranged:  RN, Disease Management, PT, Nurse's Aide [place] Agency:  [place]  Status of Service:  In process, will continue to follow  Sherrilyn Rist [phone] 04/06/2017, 11:51 AM

## 2017-04-06 NOTE — Consult Note (Addendum)
   [facility] CM Inpatient Consult   04/06/2017  [name] 05/29/1941 [id number]   Patient was assigned the EMMI HF calls for post hospital follow up by inpatient RNCM, Brenda.  Patient in the [employer] ACO.  Met with the patient at the bedside.  Patient confirms that his primary care provider is Dr. James John of [facility].  This primary care provider is listed to provide transition of care calls for their patients.  Patient accepted a brochure and information for the EMMI HF calls.  No needs at this time for community follow up per patient.  For questions, please contact:   , RN BSN CCM [facility] Liaison  [phone] business mobile phone Toll free office [phone]  ' Friend, Connie entered into the room as this writer was leaving and the patient states, "Give this information to Connie." Brochure and sheet given and explained and they both verbalized understanding.   

## 2017-04-06 NOTE — Progress Notes (Signed)
Physical Therapy Treatment Patient Details Name: [name] MRN: [id number] DOB: 1941-03-28 Today's Date: 04/06/2017    History of Present Illness Pt is a 76 y.o. male who presented to the ED with one-week history intermittent chest pain and shortness of breath. He had run out of majority of medications. PMH significant for CVA with L-sided deficits, coronary disease, history of dilated aortic root, atrial D, HTN poor control secondary to intermittent noncompliance, paroxysmal atrial fibrillation on anticoagulation, prediabetes. Carotid artery disease. Pt with worsening L sided weakness and difficulty ambulating.     PT Comments    No c/o pain, pt tolerated session well.  Pt demonstrates decreased need for assist with all mobility today.  Supervision for bed mobility and transfers and min guard for gait with RW x150'.  PT provided min verbal cues for safe use of RW and discussed d/c home with HHPT with pt and caregiver.  Feel that given pt's progress with acute PT and CLOF, d/c with HHPT and 24/7 supervision is appropriate.      Follow Up Recommendations  Home health PT;Supervision/Assistance - 24 hour     Equipment Recommendations  Other (comment);3in1 (PT) (large based quad cane)    Recommendations for Other Services       Precautions / Restrictions Precautions Precautions: Fall Restrictions Weight Bearing Restrictions: No    Mobility  Bed Mobility Overal bed mobility: Needs Assistance Bed Mobility: Supine to Sit     Supine to sit: Supervision     General bed mobility comments: pt requires increased time and supervision to come to EOB using bedrails  Transfers Overall transfer level: Needs assistance Equipment used: Rolling walker (2 wheeled) Transfers: Sit to/from Stand Sit to Stand: Supervision         General transfer comment: pt able to come to standing after multiple tries, no cues needed for hand placement, but does require min cues for safety with  DME  Ambulation/Gait Ambulation/Gait assistance: Min guard Ambulation Distance (Feet): 150 Feet Assistive device: Rolling walker (2 wheeled) Gait Pattern/deviations: Step-to pattern;Trunk flexed   Gait velocity interpretation: <1.8 ft/sec, indicative of risk for recurrent falls General Gait Details: pt with decreased need for assist with ambulation, min guard for safety and min cues for walker positioning    Stairs            Wheelchair Mobility    Modified Rankin (Stroke Patients Only)          Cognition Arousal/Alertness: Awake/alert Behavior During Therapy: WFL for tasks assessed/performed Overall Cognitive Status: History of cognitive impairments - at baseline                                 General Comments: Previous CVA         General Comments General comments (skin integrity, edema, etc.): discussed d/c home with HHPT, which is appropriate given pt's CLOF      Pertinent Vitals/Pain Pain Assessment: No/denies pain     PT Goals (current goals can now be found in the care plan section) Acute Rehab PT Goals PT Goal Formulation: With patient Time For Goal Achievement: 04/19/17 Potential to Achieve Goals: Good Progress towards PT goals: Progressing toward goals    Frequency    Min 3X/week      PT Plan Current plan remains appropriate       AM-PAC PT "6 Clicks" Daily Activity  Outcome Measure  Difficulty turning over in bed (including adjusting  bedclothes, sheets and blankets)?: A Little Difficulty moving from lying on back to sitting on the side of the bed? : A Little Difficulty sitting down on and standing up from a chair with arms (e.g., wheelchair, bedside commode, etc,.)?: A Little Help needed moving to and from a bed to chair (including a wheelchair)?: A Little Help needed walking in hospital room?: A Little Help needed climbing 3-5 steps with a railing? : A Lot 6 Click Score: 17    End of Session Equipment Utilized  During Treatment: Gait belt Activity Tolerance: Patient tolerated treatment well Patient left: in bed;with call bell/phone within reach;with nursing/sitter in room;with family/visitor present Nurse Communication: Mobility status PT Visit Diagnosis: Unsteadiness on feet (R26.81)     Time: 1025-8527 PT Time Calculation (min) (ACUTE ONLY): 20 min  Charges:  $Gait Training: 8-22 mins                    G Codes:        Michel Santee, PT, DPT 04/06/2017, 10:12 AM

## 2017-04-06 NOTE — Progress Notes (Signed)
$  340 returned to patient. Patient Valuable envelope/form receipt signed by patient, place to chart.

## 2017-04-06 NOTE — Discharge Summary (Signed)
Physician Discharge Summary  [name] QMV:[id number] DOB: [birth year] DOA: 04/03/2017  PCP: Biagio Borg, MD  Admit date: 04/03/2017 Discharge date: 04/06/2017  Admitted From:home Disposition:home  Recommendations for Outpatient Follow-up:  1. Follow up with PCP in 1-2 weeks 2. Please obtain BMP/CBC in one week  Home Health:yes Equipment/Devices:none Discharge Condition:stable CODE STATUS:full code Diet recommendation:heart healthy  Brief/Interim Summary: 76 year old male with history of coronary artery disease, stroke with left-sided residual weakness, slurred space, hypertension, paroxysmal atrial fibrillation on anticoagulation presented with a week of intermittent chest pain, shortness of breath.  #Chest pain likely in the setting of hypertensive emergency and possible acute diastolic congestive heart failure: -Treated with IV Lasix with clinical improvement. Echocardiogram with EF of 40-45% and grade 2 diastolic dysfunction. Patient has no chest pain or shortness of breath. Plan for outpatient cardiology stress test, information sent to cardiology scheduling. Plan for continuing oral Lasix and potassium supplement. Education provided to the patient regarding importance of taking medications. -Evaluated by cardiologist -Continue aspirin, Lipitor, Coreg, Lasix, Imdur and lisinopril.  #Paroxysmal atrial fibrillation: Continue Coreg  And Eliquis. Prescription provided  #Hypertensive emergency on admission: Likely due to noncompliance of the medication. Medications are listed in the hospital. Recommended to continue medication and closely monitor blood pressure.  #Hypokalemia: Repleted potassium chloride. Recommended to continue oral potassium chloride and repeat lab with PCP to monitor BMP and serum potassium level.  #Chronic kidney disease is stage III: Serum creatinine level around baseline. Avoid nephrotoxins.   #History of a stroke with a left hemiplegia: No acute  finding. PT OT evaluation done and patient is being discharged home with home care services. Discussed with the case manager.  #History of peripheral vascular disease #Chronic noncompliance of the medication: Patient was educated about the importance of medication adherence. He verbalized understanding.  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    Complete by:  As directed    Call MD for:  difficulty breathing, headache or visual disturbances    Complete by:  As directed    Call MD for:  extreme fatigue    Complete by:  As directed    Call MD for:  hives    Complete by:  As directed    Call MD for:  persistant dizziness or light-headedness    Complete by:  As directed    Call MD for:  persistant nausea and vomiting    Complete by:  As directed    Call MD for:  severe uncontrolled pain    Complete by:  As directed    Call MD for:  temperature >100.4    Complete by:  As  directed    Diet - low sodium heart healthy    Complete by:  As directed    Increase activity slowly    Complete by:  As directed      Allergies as of 04/06/2017   No Known Allergies     Medication List    STOP taking these medications    ezetimibe 10 MG tablet Commonly known as:  ZETIA   pantoprazole 40 MG tablet Commonly known as:  PROTONIX     TAKE these medications   apixaban 5 MG Tabs tablet Commonly known as:  ELIQUIS Take 1 tablet (5 mg total) by mouth 2 (two) times daily.   aspirin EC 81 MG tablet Take 1 tablet (81 mg total) by mouth daily.   atorvastatin 80 MG tablet Commonly known as:  LIPITOR Take 1 tablet (80 mg total) by mouth daily.   carvedilol 12.5 MG tablet Commonly known as:  COREG Take 1 tablet (12.5 mg total) by mouth 2 (two) times daily with a meal. What changed:  Another medication with the same name was removed. Continue taking this medication, and follow the directions you see here.   furosemide 40 MG tablet Commonly known as:  LASIX Take 1 tablet (40 mg total) by mouth daily. What changed:  medication strength  how much to take   isosorbide mononitrate 60 MG 24 hr tablet Commonly known as:  IMDUR Take 1 tablet (60 mg total) by mouth daily. What changed:  medication strength  how much to take   lisinopril 2.5 MG tablet Commonly known as:  PRINIVIL,ZESTRIL Take 1 tablet (2.5 mg total) by mouth daily.   potassium chloride SA 20 MEQ tablet Commonly known as:  K-DUR,KLOR-CON Take 2 tablets (40 mEq total) by mouth daily.   PROAIR RESPICLICK 983 (90 Base) MCG/ACT Aepb Generic drug:  Albuterol Sulfate Inhale 2 puffs into the lungs every 6 (six) hours as needed (for wheezing or shortness of breath). What changed:  Another medication with the same name was removed. Continue taking this medication, and follow the directions you see here.   senna-docusate 8.6-50 MG tablet Commonly known as:  Senokot-S Take 1 tablet by mouth at bedtime as needed for mild constipation.   tamsulosin 0.4 MG Caps capsule Commonly known as:  FLOMAX Take 1 capsule (0.4 mg total) by mouth daily after supper.            Durable Medical Equipment        Start     Ordered   04/06/17 1152   DME 3-in-1  Once     04/06/17 1151   04/06/17 1150  For home use only DME Cane  Once     04/06/17 1150   04/06/17 1149  For home use only DME 3 n 1  Once     04/06/17 1150       Discharge Care Instructions        Start     Ordered   04/07/17 0000  isosorbide mononitrate (IMDUR) 60 MG 24 hr tablet  Daily     04/06/17 1150   04/07/17 0000  furosemide (LASIX) 40 MG tablet  Daily     04/06/17 1150   04/07/17 0000  lisinopril (PRINIVIL,ZESTRIL) 2.5 MG tablet  Daily     04/06/17 1150   04/06/17 0000  aspirin EC 81 MG tablet  Daily     04/06/17 1150   04/06/17 0000  apixaban (ELIQUIS) 5 MG TABS tablet  2 times daily  04/06/17 1150   04/06/17 0000  carvedilol (COREG) 12.5 MG tablet  2 times daily with meals     04/06/17 1150   04/06/17 0000  potassium chloride SA (K-DUR,KLOR-CON) 20 MEQ tablet  Daily     04/06/17 1150   04/06/17 0000  Increase activity slowly     04/06/17 1150   04/06/17 0000  Diet - low sodium heart healthy     04/06/17 1150   04/06/17 0000  Call MD for:  temperature >100.4     04/06/17 1150   04/06/17 0000  Call MD for:  persistant nausea and vomiting     04/06/17 1150   04/06/17 0000  Call MD for:  severe uncontrolled pain     04/06/17 1150   04/06/17 0000  Call MD for:  difficulty breathing, headache or visual disturbances     04/06/17 1150   04/06/17 0000  Call MD for:  hives     04/06/17 1150   04/06/17 0000  Call MD for:  persistant dizziness or light-headedness     04/06/17 1150   04/06/17 0000  Call MD for:  extreme fatigue     04/06/17 1150   04/06/17 0000  (HEART FAILURE PATIENTS) Call MD:  Anytime you have any of the following symptoms: 1) 3 pound weight gain in 24 hours or 5 pounds in 1 week 2) shortness of breath, with or without a dry hacking cough 3) swelling in the hands, feet or stomach 4) if you have to sleep on extra pillows at night in order to breathe.     04/06/17 1150     Follow-up Information    Biagio Borg, MD. Schedule an  appointment as soon as possible for a visit in 1 week(s).   Specialties:  Internal Medicine, Radiology Contact information: [place] [address] [phone]        Dorothy Spark, MD. Schedule an appointment as soon as possible for a visit in 2 week(s).   Specialty:  Cardiology Contact information: [place] [address] [phone]          No Known Allergies  Consultations: Cardiology  Procedures/Studies: Echo  Subjective: Seen and examined at bedside. Denied headache, dizziness, nausea vomiting chest pain or shortness of breath.  Discharge Exam: Vitals:   04/05/17 2131 04/06/17 0453  BP: (!) 148/87 (!) 161/89  Pulse: 77 75  Resp: 18   Temp: 98 F (36.7 C) 97.9 F (36.6 C)  SpO2: 94% 96%   Vitals:   04/05/17 1654 04/05/17 2131 04/06/17 0453 04/06/17 0738  BP: (!) 147/69 (!) 148/87 (!) 161/89   Pulse: 72 77 75   Resp: 20 18    Temp: 97.8 F (36.6 C) 98 F (36.7 C) 97.9 F (36.6 C)   TempSrc: Oral Oral Oral   SpO2: 97% 94% 96%   Weight:    69.9 kg (154 lb 3.2 oz)  Height:        General: Pt is alert, awake, not in acute distress Cardiovascular: RRR, S1/S2 +, no rubs, no gallops Respiratory: CTA bilaterally, no wheezing, no rhonchi Abdominal: Soft, NT, ND, bowel sounds + Extremities: no edema, no cyanosis    The results of significant diagnostics from this hospitalization (including imaging, microbiology, ancillary and laboratory) are listed below for reference.     Microbiology: No results found for this or any previous visit (from the past 240 hour(s)).   Labs: BNP (last 3  results)  Recent Labs  04/03/17 1605  BNP 7,893.8*   Basic Metabolic Panel:  Recent Labs Lab 04/03/17 1605 04/04/17 0051 04/04/17 0620 04/05/17 0352 04/06/17 0630  NA 141  --   --  140 139  K 3.9  --   --  3.1* 3.1*  CL 110  --   --  106 102  CO2 22  --   --  29 31  GLUCOSE 137*  --   --  93 84   BUN 23*  --   --  16 23*  CREATININE 1.49*  --   --  1.29* 1.26*  CALCIUM 8.4*  --   --  8.4* 8.2*  MG  --  1.8  --   --   --   PHOS  --   --  2.7  --   --    Liver Function Tests:  Recent Labs Lab 04/04/17 0051 04/05/17 0352  AST 46* 42*  ALT 70* 59  ALKPHOS 117 99  BILITOT 0.8 1.0  PROT 6.5 6.0*  ALBUMIN 2.6* 2.4*   No results for input(s): LIPASE, AMYLASE in the last 168 hours. No results for input(s): AMMONIA in the last 168 hours. CBC:  Recent Labs Lab 04/03/17 1605 04/05/17 0352  WBC 5.1 6.7  HGB 11.2* 11.1*  HCT 35.1* 34.9*  MCV 65.4* 65.7*  PLT 169 125*   Cardiac Enzymes:  Recent Labs Lab 04/03/17 1845 04/04/17 0051 04/04/17 0620  TROPONINI 0.03* 0.03* 0.04*   BNP: Invalid input(s): POCBNP CBG:  Recent Labs Lab 04/05/17 0729 04/05/17 1139 04/05/17 1642 04/05/17 2131 04/06/17 0744  GLUCAP 112* 146* 121* 91 86   D-Dimer No results for input(s): DDIMER in the last 72 hours. Hgb A1c  Recent Labs  04/04/17 1306  HGBA1C 6.5*   Lipid Profile  Recent Labs  04/04/17 1306 04/05/17 0352  CHOL 144 122  HDL 23* 21*  LDLCALC 111* 91  TRIG 50 52  CHOLHDL 6.3 5.8   Thyroid function studies  Recent Labs  04/05/17 0352  TSH 0.856   Anemia work up  Recent Labs  04/04/17 0051 04/04/17 0620  VITAMINB12  --  776  FOLATE 14.4  --   FERRITIN  --  84  TIBC  --  396  IRON  --  66  RETICCTPCT  --  1.6   Urinalysis    Component Value Date/Time   COLORURINE YELLOW 04/03/2017 2108   APPEARANCEUR CLEAR 04/03/2017 2108   LABSPEC 1.020 04/03/2017 2108   PHURINE 6.0 04/03/2017 2108   GLUCOSEU NEGATIVE 04/03/2017 2108   GLUCOSEU NEGATIVE 12/31/2016 1420   HGBUR MODERATE (A) 04/03/2017 2108   BILIRUBINUR NEGATIVE 04/03/2017 2108   [place] NEGATIVE 04/03/2017 2108   PROTEINUR 100 (A) 04/03/2017 2108   UROBILINOGEN 4.0 (A) 12/31/2016 1420   NITRITE NEGATIVE 04/03/2017 2108   LEUKOCYTESUR NEGATIVE 04/03/2017 2108   Sepsis  Labs Invalid input(s): PROCALCITONIN,  WBC,  LACTICIDVEN Microbiology No results found for this or any previous visit (from the past 240 hour(s)).   Time coordinating discharge: 33 minutes  SIGNED:   Rosita Fire, MD  [facility] 04/06/2017, 11:51 AM  If 7PM-7AM, please contact night-coverage [email or web address] Password [email or web address]

## 2017-04-06 NOTE — Progress Notes (Signed)
Discharged to home, friend [name] at bedside to transport patient. Patient able to urinate prior to discharge. D/c instructions discussed with patient and reinforced in the presence of  Connie. PIV removed no s/s of swelling noted.

## 2017-04-06 NOTE — Plan of Care (Signed)
Problem: Education: Goal: Ability to demonstrate managment of disease process will improve Outcome: Progressing Patient understanding of blood pressure medication regimen is improving needs reinforcement and continued teaching

## 2017-04-07 ENCOUNTER — Telehealth: Payer: Self-pay | Admitting: *Deleted

## 2017-04-07 NOTE — Telephone Encounter (Signed)
Transition Care Management Follow-up Telephone Call   Date discharged? 04/06/17   How have you been since you were released from the hospital? Pt state he seem to be doing alright   Do you understand why you were in the hospital? YES   Do you understand the discharge instructions? YES   Where were you discharged to? Home   Items Reviewed:  Medications reviewed: YES  Allergies reviewed: YES  Dietary changes reviewed: YES, hearty healthy  Referrals reviewed: YES, cardiology appt 04/27/17   Functional Questionnaire:   Activities of Daily Living (ADLs):   He states he are independent in the following: bathing and hygiene, feeding, continence, grooming, toileting and dressing States he require assistance with the following: ambulation   Any transportation issues/concerns?: NO   Any patient concerns? NO   Confirmed importance and date/time of follow-up visits scheduled YES, 04/13/17  Provider Appointment booked with Dr. [name]  Confirmed with patient if condition begins to worsen call PCP or go to the ER.  Patient was given the office number and encouraged to call back with question or concerns.  : YES

## 2017-04-09 ENCOUNTER — Encounter (HOSPITAL_COMMUNITY): Payer: Self-pay

## 2017-04-13 ENCOUNTER — Ambulatory Visit: Payer: Medicare Other | Admitting: Internal Medicine

## 2017-04-15 DIAGNOSIS — R7303 Prediabetes: Secondary | ICD-10-CM | POA: Diagnosis not present

## 2017-04-15 DIAGNOSIS — I130 Hypertensive heart and chronic kidney disease with heart failure and stage 1 through stage 4 chronic kidney disease, or unspecified chronic kidney disease: Secondary | ICD-10-CM | POA: Diagnosis not present

## 2017-04-15 DIAGNOSIS — I252 Old myocardial infarction: Secondary | ICD-10-CM | POA: Diagnosis not present

## 2017-04-15 DIAGNOSIS — I69354 Hemiplegia and hemiparesis following cerebral infarction affecting left non-dominant side: Secondary | ICD-10-CM | POA: Diagnosis not present

## 2017-04-15 DIAGNOSIS — I5033 Acute on chronic diastolic (congestive) heart failure: Secondary | ICD-10-CM | POA: Diagnosis not present

## 2017-04-15 DIAGNOSIS — Z8679 Personal history of other diseases of the circulatory system: Secondary | ICD-10-CM | POA: Diagnosis not present

## 2017-04-15 DIAGNOSIS — Z7901 Long term (current) use of anticoagulants: Secondary | ICD-10-CM | POA: Diagnosis not present

## 2017-04-15 DIAGNOSIS — I2510 Atherosclerotic heart disease of native coronary artery without angina pectoris: Secondary | ICD-10-CM | POA: Diagnosis not present

## 2017-04-15 DIAGNOSIS — I480 Paroxysmal atrial fibrillation: Secondary | ICD-10-CM | POA: Diagnosis not present

## 2017-04-15 DIAGNOSIS — N183 Chronic kidney disease, stage 3 (moderate): Secondary | ICD-10-CM | POA: Diagnosis not present

## 2017-04-15 DIAGNOSIS — Z7951 Long term (current) use of inhaled steroids: Secondary | ICD-10-CM | POA: Diagnosis not present

## 2017-04-15 DIAGNOSIS — Z7982 Long term (current) use of aspirin: Secondary | ICD-10-CM | POA: Diagnosis not present

## 2017-04-15 DIAGNOSIS — E785 Hyperlipidemia, unspecified: Secondary | ICD-10-CM | POA: Diagnosis not present

## 2017-04-16 ENCOUNTER — Other Ambulatory Visit: Payer: Self-pay | Admitting: *Deleted

## 2017-04-16 ENCOUNTER — Other Ambulatory Visit: Payer: Self-pay

## 2017-04-16 ENCOUNTER — Encounter: Payer: Self-pay | Admitting: *Deleted

## 2017-04-16 NOTE — Patient Outreach (Signed)
[place] [facility]) Care Management  04/16/2017  [name] 1940-08-21 [id number]     EMMI-HF RED ON EMMI ALERT Day # 8 Date: 04/15/17 Red Alert Reason: "Weighed themselves today? No   New/worsening problems? Yes   Went to follow up appt? No   Why they didn't attend follow-up appt? I missed it"   Outreach attempt # 1 to patient. Spoke with patient and reviewed and addressed red alerts. Patient states that he does not have a scale in the home to monitor weight. Patient did not sound knowledgeable regarding his medical conditions. He voiced he could use some assistance managing all of his chronic conditions. He requested assistance obtaining a scale and monitoring weight in the home. He denies any new/worsening problems. He was very vague and unable to confirm with RN CM rather or not he had scheduled f/u appt. He voices that he does not drive. He relies on his friend(Connie) to take him to appts. However, he states that he does have issues getting to appts at time. Patient requested assistance with transportation resources. He also reports that he doesn't always have enough food in the home and inquired about food assistance. He voices that he currently already goes to [employer] but wanted to know about other options.  Conditions: Patient has PMH of HF, CKD stage 3, A-fib, CAD, HTN and stroke. Patient was recently discharged from the hospital on 04/06/17 for acute or chronic diastolic HF. Patient confirmed  that his primary care provider is Dr. [name].  This primary care provider is listed to provide transition of care calls for their patients.  Medications: Patient unable to recall how many meds he is taking. He voices that his friend fills med planner for him weekly.  Advance Directives: Patient reported that he has completed documents.  Consent: [name] services reviewed and discussed. Patient gave verbal consent for services.      Plan: RN CM will notify [facility]  administrative assistant of case status. RN CM will send referral to [facility] RN for further in home eval/assessment of care needs and management of chronic conditions. RN CM will send [facility] SW referral for possible assistance with community resources related to transportation and food resources.   [name], RN,BSN,CCM [place] Management Telephonic Care Management Coordinator Direct Phone: [phone] Toll Free: [phone] Fax: [phone]

## 2017-04-16 NOTE — Patient Outreach (Signed)
[facility] [facility]) Care Management  04/16/2017  JDEN WANT 1941-01-09 [id number]   CSW made an initial attempt to try and contact patient today to perform phone assessment, as well as assess and assist with social needs and services, without success.  A HIPAA compliant message was left for patient on voicemail.  CSW is currently awaiting a return call. CSW will make a second outreach attempt within the next week, if CSW does not receive a return call from patient in the meantime. [name], BSW, MSW, LCSW  Licensed Education officer, environmental Health System  Mailing [place] [address] [place], [place] [address] Physical Address-[address], [place], [place] [address] Toll Free Main # [phone] Fax # [phone] Cell # [phone]  Office # [phone] [email or web address]

## 2017-04-19 ENCOUNTER — Other Ambulatory Visit: Payer: Self-pay

## 2017-04-19 DIAGNOSIS — E785 Hyperlipidemia, unspecified: Secondary | ICD-10-CM | POA: Diagnosis not present

## 2017-04-19 DIAGNOSIS — I252 Old myocardial infarction: Secondary | ICD-10-CM | POA: Diagnosis not present

## 2017-04-19 DIAGNOSIS — I480 Paroxysmal atrial fibrillation: Secondary | ICD-10-CM | POA: Diagnosis not present

## 2017-04-19 DIAGNOSIS — Z7982 Long term (current) use of aspirin: Secondary | ICD-10-CM | POA: Diagnosis not present

## 2017-04-19 DIAGNOSIS — I5033 Acute on chronic diastolic (congestive) heart failure: Secondary | ICD-10-CM | POA: Diagnosis not present

## 2017-04-19 DIAGNOSIS — I69354 Hemiplegia and hemiparesis following cerebral infarction affecting left non-dominant side: Secondary | ICD-10-CM | POA: Diagnosis not present

## 2017-04-19 DIAGNOSIS — Z8679 Personal history of other diseases of the circulatory system: Secondary | ICD-10-CM | POA: Diagnosis not present

## 2017-04-19 DIAGNOSIS — N183 Chronic kidney disease, stage 3 (moderate): Secondary | ICD-10-CM | POA: Diagnosis not present

## 2017-04-19 DIAGNOSIS — R7303 Prediabetes: Secondary | ICD-10-CM | POA: Diagnosis not present

## 2017-04-19 DIAGNOSIS — Z7901 Long term (current) use of anticoagulants: Secondary | ICD-10-CM | POA: Diagnosis not present

## 2017-04-19 DIAGNOSIS — I2510 Atherosclerotic heart disease of native coronary artery without angina pectoris: Secondary | ICD-10-CM | POA: Diagnosis not present

## 2017-04-19 DIAGNOSIS — Z7951 Long term (current) use of inhaled steroids: Secondary | ICD-10-CM | POA: Diagnosis not present

## 2017-04-19 DIAGNOSIS — I130 Hypertensive heart and chronic kidney disease with heart failure and stage 1 through stage 4 chronic kidney disease, or unspecified chronic kidney disease: Secondary | ICD-10-CM | POA: Diagnosis not present

## 2017-04-19 NOTE — Patient Outreach (Signed)
[facility]) Care Management  04/19/17  [name] July 21, 1941 [id number]  RNCM received referral from telephonic RNCM on 04/16/2017 home eval/assessment of care needs and management of chronic conditions. Patient needs assistance with obtaining scales so that he can weigh himself. Patient would like assistance in managing all of his medical conditions.   Patient has a past medical history of HF, HF, CKD stage 3, A-fib, CAD, HTN and stroke.   Patient was recently discharged from the hospital on 04/06/17 for acute or chronic diastolic HF. Patient confirmed  that his primary care provider is Dr. [name]. [name] office performed transition of care call.   RNCM also received RED EMMI alert today for the following red alerts: 04/15/2017 Patient answered "no" to did he weigh himself today.       Patient answered "yes" to new/worsening problems.                  Patient answered "no" to went to follow-up appointment                  Patient answered "I missed it" to why they didn't attend follow up appointment. 04/16/2917 Patient answered "no" to did he weigh himself today.       Patient answered "yes" to new/worsening problems.                  Patient answered "yes" to new swelling.                  Patient answered "no" to filled new prescriptions. 04/17/2017 Patient answered "no" to did he weigh himself today.  Successful outreach completed with patient, however he stated that he was busy right now and could not talk. He requested a call back later today or tomorrow.   Plan: RNCM to follow up with patient within next 24 hours.  Craig R. Ege Muckey, RN, BSN, [place] Management Coordinator [phone]

## 2017-04-20 ENCOUNTER — Other Ambulatory Visit: Payer: Self-pay | Admitting: *Deleted

## 2017-04-20 DIAGNOSIS — I252 Old myocardial infarction: Secondary | ICD-10-CM | POA: Diagnosis not present

## 2017-04-20 DIAGNOSIS — E785 Hyperlipidemia, unspecified: Secondary | ICD-10-CM | POA: Diagnosis not present

## 2017-04-20 DIAGNOSIS — N183 Chronic kidney disease, stage 3 (moderate): Secondary | ICD-10-CM | POA: Diagnosis not present

## 2017-04-20 DIAGNOSIS — R7303 Prediabetes: Secondary | ICD-10-CM | POA: Diagnosis not present

## 2017-04-20 DIAGNOSIS — I5033 Acute on chronic diastolic (congestive) heart failure: Secondary | ICD-10-CM | POA: Diagnosis not present

## 2017-04-20 DIAGNOSIS — I480 Paroxysmal atrial fibrillation: Secondary | ICD-10-CM | POA: Diagnosis not present

## 2017-04-20 DIAGNOSIS — Z8679 Personal history of other diseases of the circulatory system: Secondary | ICD-10-CM | POA: Diagnosis not present

## 2017-04-20 DIAGNOSIS — Z7901 Long term (current) use of anticoagulants: Secondary | ICD-10-CM | POA: Diagnosis not present

## 2017-04-20 DIAGNOSIS — I130 Hypertensive heart and chronic kidney disease with heart failure and stage 1 through stage 4 chronic kidney disease, or unspecified chronic kidney disease: Secondary | ICD-10-CM | POA: Diagnosis not present

## 2017-04-20 DIAGNOSIS — I2510 Atherosclerotic heart disease of native coronary artery without angina pectoris: Secondary | ICD-10-CM | POA: Diagnosis not present

## 2017-04-20 DIAGNOSIS — I69354 Hemiplegia and hemiparesis following cerebral infarction affecting left non-dominant side: Secondary | ICD-10-CM | POA: Diagnosis not present

## 2017-04-20 DIAGNOSIS — Z7982 Long term (current) use of aspirin: Secondary | ICD-10-CM | POA: Diagnosis not present

## 2017-04-20 DIAGNOSIS — Z7951 Long term (current) use of inhaled steroids: Secondary | ICD-10-CM | POA: Diagnosis not present

## 2017-04-20 NOTE — Patient Outreach (Addendum)
[facility]) Care Management  04/20/2017  [name] 1941-04-07 [id number]   CSW made a second attempt to try and contact patient today to perform phone assessment, as well as assess and assist with social needs and services, without success.  A HIPAA compliant message was left for patient on voicemail.  CSW is currently awaiting a return call. CSW will make a third and final outreach attempt within the next week, if CSW does not receive a return call from patient in the meantime. [name], BSW, MSW, LCSW  Licensed Education officer, environmental Health System  Mailing [place] [address] [place], [place] [address] Physical Address-[address], [place], [place] [address] Toll Free Main # [phone] Fax # [phone] Cell # [phone]  Office # [phone] [email or web address]

## 2017-04-23 ENCOUNTER — Other Ambulatory Visit: Payer: Self-pay

## 2017-04-23 DIAGNOSIS — I2510 Atherosclerotic heart disease of native coronary artery without angina pectoris: Secondary | ICD-10-CM | POA: Diagnosis not present

## 2017-04-23 DIAGNOSIS — I5033 Acute on chronic diastolic (congestive) heart failure: Secondary | ICD-10-CM | POA: Diagnosis not present

## 2017-04-23 DIAGNOSIS — Z7901 Long term (current) use of anticoagulants: Secondary | ICD-10-CM | POA: Diagnosis not present

## 2017-04-23 DIAGNOSIS — I480 Paroxysmal atrial fibrillation: Secondary | ICD-10-CM | POA: Diagnosis not present

## 2017-04-23 DIAGNOSIS — Z7951 Long term (current) use of inhaled steroids: Secondary | ICD-10-CM | POA: Diagnosis not present

## 2017-04-23 DIAGNOSIS — N183 Chronic kidney disease, stage 3 (moderate): Secondary | ICD-10-CM | POA: Diagnosis not present

## 2017-04-23 DIAGNOSIS — I69354 Hemiplegia and hemiparesis following cerebral infarction affecting left non-dominant side: Secondary | ICD-10-CM | POA: Diagnosis not present

## 2017-04-23 DIAGNOSIS — I130 Hypertensive heart and chronic kidney disease with heart failure and stage 1 through stage 4 chronic kidney disease, or unspecified chronic kidney disease: Secondary | ICD-10-CM | POA: Diagnosis not present

## 2017-04-23 DIAGNOSIS — Z8679 Personal history of other diseases of the circulatory system: Secondary | ICD-10-CM | POA: Diagnosis not present

## 2017-04-23 DIAGNOSIS — I252 Old myocardial infarction: Secondary | ICD-10-CM | POA: Diagnosis not present

## 2017-04-23 DIAGNOSIS — R7303 Prediabetes: Secondary | ICD-10-CM | POA: Diagnosis not present

## 2017-04-23 DIAGNOSIS — E785 Hyperlipidemia, unspecified: Secondary | ICD-10-CM | POA: Diagnosis not present

## 2017-04-23 DIAGNOSIS — Z7982 Long term (current) use of aspirin: Secondary | ICD-10-CM | POA: Diagnosis not present

## 2017-04-23 NOTE — Patient Outreach (Signed)
[place] [facility]) Care Management  04/23/17  JOURDEN GILSON Jul 25, 1941 [id number]  Successful outreach completed with patient. Patient identification verified. RNCM provided education about [facility] Care Management and role of RNCM. Patient is agreeable with outreach.  Patient reported that he did not have a long time to talk on the phone, but he was agreeable to a home visit next week to complete assessment.  Plan: Home visit scheduled for next week.  [place] R. Mikeyla Music, RN, BSN, [place] Management Coordinator [phone]

## 2017-04-26 NOTE — Progress Notes (Signed)
Cardiology Office Note    Date:  04/27/2017   ID:  [name], DOB October 25, 1940, MRN [id number]  PCP:  Biagio Borg, MD  Cardiologist: formerly Dr. Lia Foyer, now Dr. Meda Coffee  Chief Complaint  Patient presents with  . Coronary Artery Disease    History of Present Illness:  [name] is a 76 y.o. male history of nonobstructive CAD on cath in 2008, PAF CHADSVASC=7 on Eliquis, history of CVA status post right carotid endarterectomy, difficult to control hypertension HLD was admitted to the hospital with chest pain felt secondary to hypertensive urgency with CHF secondary to noncompliance. Troponin was minimally elevated felt secondary to demand ischemia. Had negative T waves in inferior lateral leads outpatient stress test recommended. Patient had not been able to afford his medications over the past 3 months. The hospital was controlled with hydralazine, Imdur 60 mg daily and Lasix. 2-D echo in the hospital LVEF 40-45% with moderate diffuse hypokinesis and grade 2 DD.  Patient comes in today for follow-up. Blood pressure is high. He still eating out fast food. Overall feeling much better and taking his medications. He did not bring them with him today but says he's taken everything he was given at the hospital. No chest pain, dyspnea, dyspnea on exertion, dizziness or presyncope. Lives alone.  Past Medical History:  Diagnosis Date  . Anemia, iron deficiency 08/15/2011  . CAD (coronary artery disease)    cath in 1/08: EF 60%, mild dilated Ao root, oD2 30%, LAD 20-30%.  . Dilated aortic root ([place]) 08/15/2011  . Dyslipidemia   . Dysrhythmia   . History of MI (myocardial infarction) 08/15/2011  . History of pneumonia 08/15/2011  . History of stroke   . Hyperlipidemia   . Hypertension    echocardiogram 6/10: Moderate LVH, EF 55-65%, mild, mild MR, MAC, mild LAE, PASP 32  . Myocardial infarction ([place])   . Paroxysmal atrial fibrillation (HCC)   . Prediabetes 10/08/2015  . Preoperative  cardiovascular examination 10/09/2015  . Shortness of breath dyspnea   . Stroke ([place]) 10/2011  . Systolic congestive heart failure ([place]) 01/13/2017    Past Surgical History:  Procedure Laterality Date  . CARDIAC CATHETERIZATION    . ENDARTERECTOMY Right 10/10/2015   Procedure: ENDARTERECTOMY CAROTID-RIGHT;  Surgeon: Serafina Mitchell, MD;  Location: [facility] OR;  Service: Vascular;  Laterality: Right;  . IR RADIOLOGIST EVAL & MGMT  02/11/2017  . IR RADIOLOGIST EVAL & MGMT  03/24/2017    Current Medications: Current Meds  Medication Sig  . apixaban (ELIQUIS) 5 MG TABS tablet Take 1 tablet (5 mg total) by mouth 2 (two) times daily.  Marland Kitchen aspirin EC 81 MG tablet Take 1 tablet (81 mg total) by mouth daily.  Marland Kitchen atorvastatin (LIPITOR) 80 MG tablet Take 1 tablet (80 mg total) by mouth daily.  . furosemide (LASIX) 40 MG tablet Take 1 tablet (40 mg total) by mouth daily.  . isosorbide mononitrate (IMDUR) 60 MG 24 hr tablet Take 1 tablet (60 mg total) by mouth daily.  Marland Kitchen lisinopril (PRINIVIL,ZESTRIL) 2.5 MG tablet Take 1 tablet (2.5 mg total) by mouth daily.  . potassium chloride SA (K-DUR,KLOR-CON) 20 MEQ tablet Take 2 tablets (40 mEq total) by mouth daily.  Marland Kitchen PROAIR RESPICLICK 354 (90 Base) MCG/ACT AEPB Inhale 2 puffs into the lungs every 6 (six) hours as needed (for wheezing or shortness of breath).   . senna-docusate (SENOKOT-S) 8.6-50 MG tablet Take 1 tablet by mouth at bedtime as needed for mild  constipation.  . tamsulosin (FLOMAX) 0.4 MG CAPS capsule Take 1 capsule (0.4 mg total) by mouth daily after supper.  . [DISCONTINUED] carvedilol (COREG) 12.5 MG tablet Take 1 tablet (12.5 mg total) by mouth 2 (two) times daily with a meal.     Allergies:   Patient has no known allergies.   Social History   Social History  . Marital status: Widowed    Spouse name: N/A  . Number of children: 2  . Years of education: N/A   Occupational History  . retired    Social History Main Topics  . Smoking status: Never  Smoker  . Smokeless tobacco: Never Used  . Alcohol use No  . Drug use: No  . Sexual activity: Not Asked   Other Topics Concern  . None   Social History Narrative  . None     Family History:  The patient's family history includes Aneurysm in his sister.   ROS:   Please see the history of present illness.    Review of Systems  Constitution: Negative.  HENT: Negative.   Cardiovascular: Negative.   Respiratory: Negative.   Endocrine: Negative.   Hematologic/Lymphatic: Bruises/bleeds easily.  Musculoskeletal: Negative.   Gastrointestinal: Positive for constipation.  Genitourinary: Negative.   Neurological: Negative.    All other systems reviewed and are negative.   PHYSICAL EXAM:   VS:  BP (!) 172/90   Pulse 86   Resp 16   Ht 5' 6.5" (1.689 m)   Wt 162 lb 12.8 oz (73.8 kg)   SpO2 96%   BMI 25.88 kg/m   Physical Exam  GEN: Well nourished, well developed, in no acute distress  Neck: no JVD, carotid bruits, or masses Cardiac:RRR; positive S4, 2/6 systolic murmur at the left sternal border Respiratory:  Decreased breath sounds but clear to auscultation bilaterally, normal work of breathing GI: soft, nontender, nondistended, + BS Ext: without cyanosis, clubbing, or edema, Good distal pulses bilaterally Neuro:  Alert and Oriented x 3 Psych: euthymic mood, full affect  Wt Readings from Last 3 Encounters:  04/27/17 162 lb 12.8 oz (73.8 kg)  04/06/17 154 lb 3.2 oz (69.9 kg)  01/28/17 167 lb (75.8 kg)      Studies/Labs Reviewed:   EKG:  EKG is ordered today.  The ekg ordered today demonstratesNormal sinus rhythm at 86 bpm with nonspecific ST-T wave changes, no acute change  Recent Labs: 01/28/2017: Pro B Natriuretic peptide (BNP) 1,033.0 04/03/2017: B Natriuretic Peptide 2,236.3 04/04/2017: Magnesium 1.8 04/05/2017: ALT 59; Hemoglobin 11.1; Platelets 125; TSH 0.856 04/06/2017: BUN 23; Creatinine, Ser 1.26; Potassium 3.1; Sodium 139   Lipid Panel    Component Value  Date/Time   CHOL 122 04/05/2017 0352   TRIG 52 04/05/2017 0352   HDL 21 (L) 04/05/2017 0352   CHOLHDL 5.8 04/05/2017 0352   VLDL 10 04/05/2017 0352   LDLCALC 91 04/05/2017 0352   LDLDIRECT 230.0 01/20/2013 1547    Additional studies/ records that were reviewed today include:  Echocardiogram 04/05/17 Study Conclusions   - Left ventricle: The cavity size was normal. There was severe   concentric hypertrophy. Systolic function was mildly to   moderately reduced. The estimated ejection fraction was in the   range of 40% to 45%. Moderate diffuse hypokinesis with no   identifiable regional variations. Features are consistent with a   pseudonormal left ventricular filling pattern, with concomitant   abnormal relaxation and increased filling pressure (grade 2   diastolic dysfunction). - Aortic valve: There was  trivial regurgitation. - Left atrium: The atrium was mildly dilated. - Right atrium: The atrium was mildly dilated. - Atrial septum: No defect or patent foramen ovale was identified.     ASSESSMENT:    1. 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 Paroxysmal atrial fibrillation (HCC)   3. Chronic combined systolic and diastolic CHF (congestive heart failure) ([place])   4. Coronary artery disease due to lipid rich plaque   5. 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 Will increase Coreg to 25 mg twice a day 2 g sodium diet. Follow-up with me in 1-2 months and Dr. Meda Coffee after that. Isn't weighing himself daily and relies on a friend to arrange his medications. Has missed an appointment. [facility] following.  PAF CHADSVASC=7 on Eliquis in normal sinus rhythm today  Chronic combined systolic and diastolic CHF echo in the hospital EF 40-45% with grade 2 DD. 2 g sodium diarrhea and rated. Patient is  compensated today.  CAD nonobstructive lung cath in 2008  CK D stage III on low-dose lisinopril. Recheck renal function today as well as CBC    Medication Adjustments/Labs and Tests Ordered: Current medicines are reviewed at length with the patient today.  Concerns regarding medicines are outlined above.  Medication changes, Labs and Tests ordered today are listed in the Patient Instructions below. Patient Instructions  Medication Instructions:  Your physician has recommended you make the following change in your medication:  1.  INCREASE the Coreg to 25 mg taking 1 tablet twice a day   Labwork: TODAY:  BMET & CBC  Testing/Procedures: None ordered  Follow-Up: Your physician recommends that you schedule a follow-up appointment in: 1-2 MONTHS WITH Ermalinda Barrios, PA-C Your physician recommends that you schedule a follow-up appointment in: [place] 1ST AVAILABLE    Any Other Special Instructions Will Be Listed Below (If Applicable).  DASH Eating Plan DASH stands for "Dietary Approaches to Stop Hypertension." The DASH eating plan is a healthy eating plan that has been shown to reduce high blood pressure (hypertension). It may also reduce your risk for type 2 diabetes, heart disease, and stroke. The DASH eating plan may also help with weight loss. What are tips for following this plan? General guidelines  Avoid eating more than 2,000 mg (milligrams) of salt (sodium) a day. If you have hypertension, you may need to reduce your sodium intake to 1,500 mg a day.  Limit alcohol intake to no more than 1 drink a day for nonpregnant women and 2 drinks a day for men. One drink equals 12 oz of beer, 5 oz of wine, or 1 oz of hard liquor.  Work with your health care provider to maintain a healthy body weight or to lose weight. Ask what an ideal weight is for you.  Get at least 30 minutes of exercise that causes your heart to beat faster (aerobic exercise) most days  of the week. Activities may include walking, swimming, or biking.  Work with your health care provider or diet and nutrition specialist (dietitian) to adjust your eating plan to your individual calorie needs. Reading food labels  Check food labels for the amount of sodium per serving. Choose foods with less than 5 percent of the Daily Value of sodium. Generally, foods with  less than 300 mg of sodium per serving fit into this eating plan.  To find whole grains, look for the word "whole" as the first word in the ingredient list. Shopping  Buy products labeled as "low-sodium" or "no salt added."  Buy fresh foods. Avoid canned foods and premade or frozen meals. Cooking  Avoid adding salt when cooking. Use salt-free seasonings or herbs instead of table salt or sea salt. Check with your health care provider or pharmacist before using salt substitutes.  Do not fry foods. Cook foods using healthy methods such as baking, boiling, grilling, and broiling instead.  Cook with heart-healthy oils, such as olive, canola, soybean, or sunflower oil. Meal planning   Eat a balanced diet that includes: ? 5 or more servings of fruits and vegetables each day. At each meal, try to fill half of your plate with fruits and vegetables. ? Up to 6-8 servings of whole grains each day. ? Less than 6 oz of lean meat, poultry, or fish each day. A 3-oz serving of meat is about the same size as a deck of cards. One egg equals 1 oz. ? 2 servings of low-fat dairy each day. ? A serving of nuts, seeds, or beans 5 times each week. ? Heart-healthy fats. Healthy fats called Omega-3 fatty acids are found in foods such as flaxseeds and coldwater fish, like sardines, salmon, and mackerel.  Limit how much you eat of the following: ? Canned or prepackaged foods. ? Food that is high in trans fat, such as fried foods. ? Food that is high in saturated fat, such as fatty meat. ? Sweets, desserts, sugary drinks, and other foods with  added sugar. ? Full-fat dairy products.  Do not salt foods before eating.  Try to eat at least 2 vegetarian meals each week.  Eat more home-cooked food and less restaurant, buffet, and fast food.  When eating at a restaurant, ask that your food be prepared with less salt or no salt, if possible. What foods are recommended? The items listed may not be a complete list. Talk with your dietitian about what dietary choices are best for you. Grains Whole-grain or whole-wheat bread. Whole-grain or whole-wheat pasta. Brown rice. [place]. Bulgur. Whole-grain and low-sodium cereals. Pita bread. Low-fat, low-sodium crackers. Whole-wheat flour tortillas. Vegetables Fresh or frozen vegetables (raw, steamed, roasted, or grilled). Low-sodium or reduced-sodium tomato and vegetable juice. Low-sodium or reduced-sodium tomato sauce and tomato paste. Low-sodium or reduced-sodium canned vegetables. Fruits All fresh, dried, or frozen fruit. Canned fruit in natural juice (without added sugar). Meat and other protein foods Skinless chicken or [place]. Ground chicken or [place]. Pork with fat trimmed off. Fish and seafood. Egg whites. Dried beans, peas, or lentils. Unsalted nuts, nut butters, and seeds. Unsalted canned beans. Lean cuts of beef with fat trimmed off. Low-sodium, lean deli meat. Dairy Low-fat (1%) or fat-free (skim) milk. Fat-free, low-fat, or reduced-fat cheeses. Nonfat, low-sodium ricotta or cottage cheese. Low-fat or nonfat yogurt. Low-fat, low-sodium cheese. Fats and oils Soft margarine without trans fats. Vegetable oil. Low-fat, reduced-fat, or light mayonnaise and salad dressings (reduced-sodium). Canola, safflower, olive, soybean, and sunflower oils. Avocado. Seasoning and other foods Herbs. Spices. Seasoning mixes without salt. Unsalted popcorn and pretzels. Fat-free sweets. What foods are not recommended? The items listed may not be a complete list. Talk with your dietitian about what  dietary choices are best for you. Grains Baked goods made with fat, such as croissants, muffins, or some breads. Dry pasta or rice meal  packs. Vegetables Creamed or fried vegetables. Vegetables in a cheese sauce. Regular canned vegetables (not low-sodium or reduced-sodium). Regular canned tomato sauce and paste (not low-sodium or reduced-sodium). Regular tomato and vegetable juice (not low-sodium or reduced-sodium). Angie Fava. Olives. Fruits Canned fruit in a light or heavy syrup. Fried fruit. Fruit in cream or butter sauce. Meat and other protein foods Fatty cuts of meat. Ribs. Fried meat. Berniece Salines. Sausage. Bologna and other processed lunch meats. Salami. Fatback. Hotdogs. Bratwurst. Salted nuts and seeds. Canned beans with added salt. Canned or smoked fish. Whole eggs or egg yolks. Chicken or [place] with skin. Dairy Whole or 2% milk, cream, and half-and-half. Whole or full-fat cream cheese. Whole-fat or sweetened yogurt. Full-fat cheese. Nondairy creamers. Whipped toppings. Processed cheese and cheese spreads. Fats and oils Butter. Stick margarine. Lard. Shortening. Ghee. Bacon fat. Tropical oils, such as coconut, palm kernel, or palm oil. Seasoning and other foods Salted popcorn and pretzels. Onion salt, garlic salt, seasoned salt, table salt, and sea salt. Worcestershire sauce. Tartar sauce. Barbecue sauce. Teriyaki sauce. Soy sauce, including reduced-sodium. Steak sauce. Canned and packaged gravies. Fish sauce. Oyster sauce. Cocktail sauce. Horseradish that you find on the shelf. Ketchup. Mustard. Meat flavorings and tenderizers. Bouillon cubes. Hot sauce and Tabasco sauce. Premade or packaged marinades. Premade or packaged taco seasonings. Relishes. Regular salad dressings. Where to find more information:  National [facility], [facility]: https://wilson-eaton.com/  American Heart Association: www.heart.org Summary  The DASH eating plan is a healthy eating plan that has been shown to reduce  high blood pressure (hypertension). It may also reduce your risk for type 2 diabetes, heart disease, and stroke.  With the DASH eating plan, you should limit salt (sodium) intake to 2,300 mg a day. If you have hypertension, you may need to reduce your sodium intake to 1,500 mg a day.  When on the DASH eating plan, aim to eat more fresh fruits and vegetables, whole grains, lean proteins, low-fat dairy, and heart-healthy fats.  Work with your health care provider or diet and nutrition specialist (dietitian) to adjust your eating plan to your individual calorie needs. This information is not intended to replace advice given to you by your health care provider. Make sure you discuss any questions you have with your health care provider. Document Released: 07/09/2011 Document Revised: 07/13/2016 Document Reviewed: 07/13/2016 Elsevier Interactive Patient Education  2017 [employer].     If you need a refill on your cardiac medications before your next appointment, please call your pharmacy.      Sumner Boast, PA-C  04/27/2017 9:38 AM    [facility] [address], [place], [place]  [address] Phone: [phone]; Fax: [phone]

## 2017-04-27 ENCOUNTER — Encounter (INDEPENDENT_AMBULATORY_CARE_PROVIDER_SITE_OTHER): Payer: Self-pay

## 2017-04-27 ENCOUNTER — Ambulatory Visit (INDEPENDENT_AMBULATORY_CARE_PROVIDER_SITE_OTHER): Payer: Medicare Other | Admitting: Physician Assistant

## 2017-04-27 ENCOUNTER — Encounter: Payer: Self-pay | Admitting: Physician Assistant

## 2017-04-27 ENCOUNTER — Telehealth: Payer: Self-pay | Admitting: Internal Medicine

## 2017-04-27 ENCOUNTER — Other Ambulatory Visit: Payer: Self-pay

## 2017-04-27 VITALS — BP 172/90 | HR 86 | Resp 16 | Ht 66.5 in | Wt 162.8 lb

## 2017-04-27 DIAGNOSIS — I119 Hypertensive heart disease without heart failure: Secondary | ICD-10-CM | POA: Diagnosis not present

## 2017-04-27 DIAGNOSIS — I2510 Atherosclerotic heart disease of native coronary artery without angina pectoris: Secondary | ICD-10-CM | POA: Diagnosis not present

## 2017-04-27 DIAGNOSIS — I5042 Chronic combined systolic (congestive) and diastolic (congestive) heart failure: Secondary | ICD-10-CM

## 2017-04-27 DIAGNOSIS — N183 Chronic kidney disease, stage 3 unspecified: Secondary | ICD-10-CM

## 2017-04-27 DIAGNOSIS — I2583 Coronary atherosclerosis due to lipid rich plaque: Secondary | ICD-10-CM | POA: Diagnosis not present

## 2017-04-27 DIAGNOSIS — I480 Paroxysmal atrial fibrillation: Secondary | ICD-10-CM | POA: Diagnosis not present

## 2017-04-27 MED ORDER — CARVEDILOL 25 MG PO TABS: 25.0000 mg | ORAL_TABLET | Freq: Two times a day (BID) | ORAL | 3 refills | Status: DC

## 2017-04-27 NOTE — Telephone Encounter (Signed)
Notified Jeff w/MD response../[email or web address] 

## 2017-04-27 NOTE — Telephone Encounter (Signed)
Ok for verbals 

## 2017-04-27 NOTE — Patient Outreach (Signed)
[facility]) Care Management  04/27/2017  MOSI HANNOLD 15-Dec-1940 [id number]  RNCM arrived at patient's home for home visit and knocked on the door for several minutes with no answer. Attempted to call patient with no success. Left HIPAA compliant voicemail with RNCM contact information and invited patient to call back.  [place] R. Edman Lipsey, RN, BSN, [place] Management Coordinator [phone]

## 2017-04-27 NOTE — Patient Instructions (Signed)
Medication Instructions:  Your physician has recommended you make the following change in your medication:  1.  INCREASE the Coreg to 25 mg taking 1 tablet twice a day   Labwork: TODAY:  BMET & CBC  Testing/Procedures: None ordered  Follow-Up: Your physician recommends that you schedule a follow-up appointment in: 1-2 MONTHS WITH Craig Barrios, PA-C Your physician recommends that you schedule a follow-up appointment in: [place] 1ST AVAILABLE    Any Other Special Instructions Will Be Listed Below (If Applicable).  DASH Eating Plan DASH stands for "Dietary Approaches to Stop Hypertension." The DASH eating plan is a healthy eating plan that has been shown to reduce high blood pressure (hypertension). It may also reduce your risk for type 2 diabetes, heart disease, and stroke. The DASH eating plan may also help with weight loss. What are tips for following this plan? General guidelines  Avoid eating more than 2,000 mg (milligrams) of salt (sodium) a day. If you have hypertension, you may need to reduce your sodium intake to 1,500 mg a day.  Limit alcohol intake to no more than 1 drink a day for nonpregnant women and 2 drinks a day for men. One drink equals 12 oz of beer, 5 oz of wine, or 1 oz of hard liquor.  Work with your health care provider to maintain a healthy body weight or to lose weight. Ask what an ideal weight is for you.  Get at least 30 minutes of exercise that causes your heart to beat faster (aerobic exercise) most days of the week. Activities may include walking, swimming, or biking.  Work with your health care provider or diet and nutrition specialist (dietitian) to adjust your eating plan to your individual calorie needs. Reading food labels  Check food labels for the amount of sodium per serving. Choose foods with less than 5 percent of the Daily Value of sodium. Generally, foods with less than 300 mg of sodium per serving fit into this  eating plan.  To find whole grains, look for the word "whole" as the first word in the ingredient list. Shopping  Buy products labeled as "low-sodium" or "no salt added."  Buy fresh foods. Avoid canned foods and premade or frozen meals. Cooking  Avoid adding salt when cooking. Use salt-free seasonings or herbs instead of table salt or sea salt. Check with your health care provider or pharmacist before using salt substitutes.  Do not fry foods. Cook foods using healthy methods such as baking, boiling, grilling, and broiling instead.  Cook with heart-healthy oils, such as olive, canola, soybean, or sunflower oil. Meal planning   Eat a balanced diet that includes: ? 5 or more servings of fruits and vegetables each day. At each meal, try to fill half of your plate with fruits and vegetables. ? Up to 6-8 servings of whole grains each day. ? Less than 6 oz of lean meat, poultry, or fish each day. A 3-oz serving of meat is about the same size as a deck of cards. One egg equals 1 oz. ? 2 servings of low-fat dairy each day. ? A serving of nuts, seeds, or beans 5 times each week. ? Heart-healthy fats. Healthy fats called Omega-3 fatty acids are found in foods such as flaxseeds and coldwater fish, like sardines, salmon, and mackerel.  Limit how much you eat of the following: ? Canned or prepackaged foods. ? Food that is high in trans fat, such as fried foods. ? Food that  is high in saturated fat, such as fatty meat. ? Sweets, desserts, sugary drinks, and other foods with added sugar. ? Full-fat dairy products.  Do not salt foods before eating.  Try to eat at least 2 vegetarian meals each week.  Eat more home-cooked food and less restaurant, buffet, and fast food.  When eating at a restaurant, ask that your food be prepared with less salt or no salt, if possible. What foods are recommended? The items listed may not be a complete list. Talk with your dietitian about what dietary choices  are best for you. Grains Whole-grain or whole-wheat bread. Whole-grain or whole-wheat pasta. Brown rice. [place] Morrow. Bulgur. Whole-grain and low-sodium cereals. Pita bread. Low-fat, low-sodium crackers. Whole-wheat flour tortillas. Vegetables Fresh or frozen vegetables (raw, steamed, roasted, or grilled). Low-sodium or reduced-sodium tomato and vegetable juice. Low-sodium or reduced-sodium tomato sauce and tomato paste. Low-sodium or reduced-sodium canned vegetables. Fruits All fresh, dried, or frozen fruit. Canned fruit in natural juice (without added sugar). Meat and other protein foods Skinless chicken or [place]. Ground chicken or [place]. Pork with fat trimmed off. Fish and seafood. Egg whites. Dried beans, peas, or lentils. Unsalted nuts, nut butters, and seeds. Unsalted canned beans. Lean cuts of beef with fat trimmed off. Low-sodium, lean deli meat. Dairy Low-fat (1%) or fat-free (skim) milk. Fat-free, low-fat, or reduced-fat cheeses. Nonfat, low-sodium ricotta or cottage cheese. Low-fat or nonfat yogurt. Low-fat, low-sodium cheese. Fats and oils Soft margarine without trans fats. Vegetable oil. Low-fat, reduced-fat, or light mayonnaise and salad dressings (reduced-sodium). Canola, safflower, olive, soybean, and sunflower oils. Avocado. Seasoning and other foods Herbs. Spices. Seasoning mixes without salt. Unsalted popcorn and pretzels. Fat-free sweets. What foods are not recommended? The items listed may not be a complete list. Talk with your dietitian about what dietary choices are best for you. Grains Baked goods made with fat, such as croissants, muffins, or some breads. Dry pasta or rice meal packs. Vegetables Creamed or fried vegetables. Vegetables in a cheese sauce. Regular canned vegetables (not low-sodium or reduced-sodium). Regular canned tomato sauce and paste (not low-sodium or reduced-sodium). Regular tomato and vegetable juice (not low-sodium or reduced-sodium). Angie Fava.  Olives. Fruits Canned fruit in a light or heavy syrup. Fried fruit. Fruit in cream or butter sauce. Meat and other protein foods Fatty cuts of meat. Ribs. Fried meat. Berniece Salines. Sausage. Bologna and other processed lunch meats. Salami. Fatback. Hotdogs. Bratwurst. Salted nuts and seeds. Canned beans with added salt. Canned or smoked fish. Whole eggs or egg yolks. Chicken or [place] with skin. Dairy Whole or 2% milk, cream, and half-and-half. Whole or full-fat cream cheese. Whole-fat or sweetened yogurt. Full-fat cheese. Nondairy creamers. Whipped toppings. Processed cheese and cheese spreads. Fats and oils Butter. Stick margarine. Lard. Shortening. Ghee. Bacon fat. Tropical oils, such as coconut, palm kernel, or palm oil. Seasoning and other foods Salted popcorn and pretzels. Onion salt, garlic salt, seasoned salt, table salt, and sea salt. Worcestershire sauce. Tartar sauce. Barbecue sauce. Teriyaki sauce. Soy sauce, including reduced-sodium. Steak sauce. Canned and packaged gravies. Fish sauce. Oyster sauce. Cocktail sauce. Horseradish that you find on the shelf. Ketchup. Mustard. Meat flavorings and tenderizers. Bouillon cubes. Hot sauce and Tabasco sauce. Premade or packaged marinades. Premade or packaged taco seasonings. Relishes. Regular salad dressings. Where to find more information:  National Heart, Lung, and [place]: https://wilson-eaton.com/  American Heart Association: www.heart.org Summary  The DASH eating plan is a healthy eating plan that has been shown to reduce high blood pressure (hypertension).  It may also reduce your risk for type 2 diabetes, heart disease, and stroke.  With the DASH eating plan, you should limit salt (sodium) intake to 2,300 mg a day. If you have hypertension, you may need to reduce your sodium intake to 1,500 mg a day.  When on the DASH eating plan, aim to eat more fresh fruits and vegetables, whole grains, lean proteins, low-fat dairy, and heart-healthy  fats.  Work with your health care provider or diet and nutrition specialist (dietitian) to adjust your eating plan to your individual calorie needs. This information is not intended to replace advice given to you by your health care provider. Make sure you discuss any questions you have with your health care provider. Document Released: 07/09/2011 Document Revised: 07/13/2016 Document Reviewed: 07/13/2016 Elsevier Interactive Patient Education  2017 [employer].     If you need a refill on your cardiac medications before your next appointment, please call your pharmacy.

## 2017-04-27 NOTE — Telephone Encounter (Signed)
Needs verbals for physical therapy 1week1 for follow up to make sure pt is following his exercise program

## 2017-04-28 ENCOUNTER — Other Ambulatory Visit: Payer: Self-pay | Admitting: *Deleted

## 2017-04-28 ENCOUNTER — Encounter: Payer: Self-pay | Admitting: *Deleted

## 2017-04-28 ENCOUNTER — Other Ambulatory Visit: Payer: Self-pay

## 2017-04-28 DIAGNOSIS — I480 Paroxysmal atrial fibrillation: Secondary | ICD-10-CM | POA: Diagnosis not present

## 2017-04-28 DIAGNOSIS — I252 Old myocardial infarction: Secondary | ICD-10-CM | POA: Diagnosis not present

## 2017-04-28 DIAGNOSIS — Z7951 Long term (current) use of inhaled steroids: Secondary | ICD-10-CM | POA: Diagnosis not present

## 2017-04-28 DIAGNOSIS — I69354 Hemiplegia and hemiparesis following cerebral infarction affecting left non-dominant side: Secondary | ICD-10-CM | POA: Diagnosis not present

## 2017-04-28 DIAGNOSIS — Z7901 Long term (current) use of anticoagulants: Secondary | ICD-10-CM | POA: Diagnosis not present

## 2017-04-28 DIAGNOSIS — R7303 Prediabetes: Secondary | ICD-10-CM | POA: Diagnosis not present

## 2017-04-28 DIAGNOSIS — I5033 Acute on chronic diastolic (congestive) heart failure: Secondary | ICD-10-CM | POA: Diagnosis not present

## 2017-04-28 DIAGNOSIS — Z7982 Long term (current) use of aspirin: Secondary | ICD-10-CM | POA: Diagnosis not present

## 2017-04-28 DIAGNOSIS — I2510 Atherosclerotic heart disease of native coronary artery without angina pectoris: Secondary | ICD-10-CM | POA: Diagnosis not present

## 2017-04-28 DIAGNOSIS — Z8679 Personal history of other diseases of the circulatory system: Secondary | ICD-10-CM | POA: Diagnosis not present

## 2017-04-28 DIAGNOSIS — N183 Chronic kidney disease, stage 3 (moderate): Secondary | ICD-10-CM | POA: Diagnosis not present

## 2017-04-28 DIAGNOSIS — E785 Hyperlipidemia, unspecified: Secondary | ICD-10-CM | POA: Diagnosis not present

## 2017-04-28 DIAGNOSIS — I130 Hypertensive heart and chronic kidney disease with heart failure and stage 1 through stage 4 chronic kidney disease, or unspecified chronic kidney disease: Secondary | ICD-10-CM | POA: Diagnosis not present

## 2017-04-28 LAB — BASIC METABOLIC PANEL
BUN/Creatinine Ratio: 13 (ref 10–24)
BUN: 13 mg/dL (ref 8–27)
CALCIUM: 8.7 mg/dL (ref 8.6–10.2)
CHLORIDE: 102 mmol/L (ref 96–106)
CO2: 24 mmol/L (ref 20–29)
Creatinine, Ser: 1.03 mg/dL (ref 0.76–1.27)
GFR calc Af Amer: 82 mL/min/{1.73_m2} (ref >59)
GFR calc non Af Amer: 71 mL/min/{1.73_m2} (ref >59)
GLUCOSE: 87 mg/dL (ref 65–99)
POTASSIUM: 3.9 mmol/L (ref 3.5–5.2)
SODIUM: 141 mmol/L (ref 134–144)

## 2017-04-28 LAB — CBC
Hematocrit: 35 % — ABNORMAL LOW (ref 37.5–51.0)
Hemoglobin: 10.8 g/dL — ABNORMAL LOW (ref 13.0–17.7)
MCH: 21.1 pg — ABNORMAL LOW (ref 26.6–33.0)
MCHC: 30.9 g/dL — AB (ref 31.5–35.7)
MCV: 68 fL — ABNORMAL LOW (ref 79–97)
PLATELETS: 198 10*3/uL (ref 150–379)
RBC: 5.13 x10E6/uL (ref 4.14–5.80)
RDW: 18.1 % — AB (ref 12.3–15.4)
WBC: 3.9 10*3/uL (ref 3.4–10.8)

## 2017-04-28 NOTE — Patient Outreach (Signed)
[facility]) Care Management  04/28/2017  [name] Mar 28, 1941 [id number]   CSW made a third and final attempt to try and contact patient today to perform phone assessment, as well as assess and assist with social work needs and services, without success.  A HIPAA compliant message was left for patient on voicemail.  CSW  continues to await a return call.  CSW will mail an outreach letter to patient's home, encouraging patient to contact CSW at their earliest convenience, if patient is interested in receiving social work services through [place] with [facility].  If CSW does not receive a return call from patient within the next 10 business days, CSW will proceed with case closure.  Required number of phone attempts will have been made and outreach letter mailed.   [name], BSW, MSW, LCSW  Licensed Education officer, [facility]  Mailing [place] [address] [place], [place] [address] Physical Address-[address], [place], [place] [address] Toll Free Main # [phone] Fax # [phone] Cell # [phone]  Office # [phone] [email or web address]

## 2017-04-28 NOTE — Patient Outreach (Signed)
[facility]) Care Management  04/28/17  [name] Mar 25, 1941 [id number]  RNCM received RED EMMI alert for 04/27/2017 for answering "YES" to  "Lost interest in things they used to enjoy?"  RNCM attempted to reach patient without success. Left HIPAA compliant voicemail with RNCM contact information and invited patient to return call.  Plan: RNCM will attempt a final outreach call within the next 2-3 business days.  [place] R. Cambrea Kirt, RN, BSN, [place] Management Coordinator [phone]

## 2017-04-30 ENCOUNTER — Other Ambulatory Visit (INDEPENDENT_AMBULATORY_CARE_PROVIDER_SITE_OTHER): Payer: Medicare Other

## 2017-04-30 ENCOUNTER — Encounter: Payer: Self-pay | Admitting: Internal Medicine

## 2017-04-30 ENCOUNTER — Ambulatory Visit (INDEPENDENT_AMBULATORY_CARE_PROVIDER_SITE_OTHER): Payer: Medicare Other | Admitting: Internal Medicine

## 2017-04-30 ENCOUNTER — Other Ambulatory Visit: Payer: Self-pay

## 2017-04-30 VITALS — BP 142/90 | HR 72 | Temp 97.8°F | Ht 66.5 in | Wt 160.0 lb

## 2017-04-30 DIAGNOSIS — I5043 Acute on chronic combined systolic (congestive) and diastolic (congestive) heart failure: Secondary | ICD-10-CM | POA: Diagnosis not present

## 2017-04-30 DIAGNOSIS — N183 Chronic kidney disease, stage 3 unspecified: Secondary | ICD-10-CM

## 2017-04-30 DIAGNOSIS — I10 Essential (primary) hypertension: Secondary | ICD-10-CM

## 2017-04-30 LAB — HEPATIC FUNCTION PANEL
ALT: 11 U/L (ref 0–53)
AST: 16 U/L (ref 0–37)
Albumin: 3.5 g/dL (ref 3.5–5.2)
Alkaline Phosphatase: 79 U/L (ref 39–117)
BILIRUBIN DIRECT: 0.2 mg/dL (ref 0.0–0.3)
BILIRUBIN TOTAL: 0.6 mg/dL (ref 0.2–1.2)
Total Protein: 7.4 g/dL (ref 6.0–8.3)

## 2017-04-30 LAB — CBC WITH DIFFERENTIAL/PLATELET
BASOS PCT: 0.5 % (ref 0.0–3.0)
Basophils Absolute: 0 10*3/uL (ref 0.0–0.1)
EOS ABS: 0.1 10*3/uL (ref 0.0–0.7)
EOS PCT: 3.1 % (ref 0.0–5.0)
HEMATOCRIT: 35.8 % — AB (ref 39.0–52.0)
HEMOGLOBIN: 11 g/dL — AB (ref 13.0–17.0)
Lymphocytes Relative: 33.8 % (ref 12.0–46.0)
Lymphs Abs: 1.6 10*3/uL (ref 0.7–4.0)
MCHC: 30.7 g/dL (ref 30.0–36.0)
MCV: 68.8 fl — ABNORMAL LOW (ref 78.0–100.0)
Monocytes Absolute: 0.6 10*3/uL (ref 0.1–1.0)
Monocytes Relative: 12.3 % — ABNORMAL HIGH (ref 3.0–12.0)
NEUTROS ABS: 2.3 10*3/uL (ref 1.4–7.7)
Neutrophils Relative %: 50.3 % (ref 43.0–77.0)
PLATELETS: 181 10*3/uL (ref 150.0–400.0)
RBC: 5.21 Mil/uL (ref 4.22–5.81)
RDW: 18.5 % — AB (ref 11.5–15.5)
WBC: 4.6 10*3/uL (ref 4.0–10.5)

## 2017-04-30 LAB — BASIC METABOLIC PANEL
BUN: 14 mg/dL (ref 6–23)
CHLORIDE: 100 meq/L (ref 96–112)
CO2: 33 mEq/L — ABNORMAL HIGH (ref 19–32)
Calcium: 9.2 mg/dL (ref 8.4–10.5)
Creatinine, Ser: 1.08 mg/dL (ref 0.40–1.50)
GFR: 85.65 mL/min (ref >60.00)
Glucose, Bld: 88 mg/dL (ref 70–99)
POTASSIUM: 3.8 meq/L (ref 3.5–5.1)
Sodium: 138 mEq/L (ref 135–145)

## 2017-04-30 MED ORDER — FUROSEMIDE 40 MG PO TABS: 40.0000 mg | ORAL_TABLET | Freq: Every day | ORAL | 0 refills | Status: DC

## 2017-04-30 MED ORDER — APIXABAN 5 MG PO TABS: 5.0000 mg | ORAL_TABLET | Freq: Two times a day (BID) | ORAL | 2 refills | Status: DC

## 2017-04-30 MED ORDER — POTASSIUM CHLORIDE CRYS ER 20 MEQ PO TBCR: 40.0000 meq | EXTENDED_RELEASE_TABLET | Freq: Every day | ORAL | 0 refills | Status: DC

## 2017-04-30 MED ORDER — ATORVASTATIN CALCIUM 80 MG PO TABS: 80.0000 mg | ORAL_TABLET | Freq: Every day | ORAL | 3 refills | Status: DC

## 2017-04-30 MED ORDER — TAMSULOSIN HCL 0.4 MG PO CAPS: 0.4000 mg | ORAL_CAPSULE | Freq: Every day | ORAL | 3 refills | Status: DC

## 2017-04-30 MED ORDER — CARVEDILOL 25 MG PO TABS: 25.0000 mg | ORAL_TABLET | Freq: Two times a day (BID) | ORAL | 3 refills | Status: DC

## 2017-04-30 MED ORDER — ISOSORBIDE MONONITRATE ER 60 MG PO TB24: 60.0000 mg | ORAL_TABLET | Freq: Every day | ORAL | 0 refills | Status: DC

## 2017-04-30 MED ORDER — LISINOPRIL 2.5 MG PO TABS: 2.5000 mg | ORAL_TABLET | Freq: Every day | ORAL | 0 refills | Status: DC

## 2017-04-30 NOTE — Assessment & Plan Note (Signed)
stable overall by history and exam, recent data reviewed with pt, and pt to continue medical treatment as before,  to f/u any worsening symptoms or concerns Lab Results  Component Value Date   CREATININE 1.08 04/30/2017

## 2017-04-30 NOTE — Progress Notes (Signed)
Subjective:    Patient ID: [name], male    DOB: 08-Nov-1940, 76 y.o.   MRN: [id number]  HPI Here after recent hopspn with d/c 9/4, pt is a 76 year old male with history of coronary artery disease, stroke with left-sided residual weakness, slurred space, hypertension, paroxysmal atrial fibrillation on anticoagulation presented with a week of intermittent chest pain, shortness of breath. Chest pain likely in the setting of hypertensive emergency and possible acute diastolic congestive heart failure: -Evaluated by cardiologist. -Continue aspirin, Lipitor, Coreg, Lasix, Imdur and lisinopril Recommendations for Outpatient Follow-up:  1. Follow up with PCP in 1-2 weeks 2. Please obtain BMP/CBC in one week Pt denies chest pain, increased sob or doe, wheezing, orthopnea, PND, increased LE swelling, palpitations, dizziness or syncope. In fact most swelling now resolved. Usually walks with cane, no recent fall post d/c.  Wt stable Wt Readings from Last 3 Encounters:  04/30/17 160 lb (72.6 kg)  04/27/17 162 lb 12.8 oz (73.8 kg)  04/06/17 154 lb 3.2 oz (69.9 kg)  Has been seen with Home health.  Pt denies fever, wt loss, night sweats, loss of appetite, or other constitutional symptoms  Denies worsening depressive symptoms, suicidal ideation, or panic No new complaints but is asking for assistance with getting his pills in his monthly pill device as he gets confused.  Has hx of non compliance and confusion about meds in past Past Medical History:  Diagnosis Date  . Anemia, iron deficiency 08/15/2011  . CAD (coronary artery disease)    cath in 1/08: EF 60%, mild dilated Ao root, oD2 30%, LAD 20-30%.  . Dilated aortic root ([place]) 08/15/2011  . Dyslipidemia   . Dysrhythmia   . History of MI (myocardial infarction) 08/15/2011  . History of pneumonia 08/15/2011  . History of stroke   . Hyperlipidemia   . Hypertension    echocardiogram 6/10: Moderate LVH, EF 55-65%, mild, mild MR, MAC, mild LAE, PASP 32   . Myocardial infarction ([place])   . Paroxysmal atrial fibrillation ([facility])   . Prediabetes 10/08/2015  . Preoperative cardiovascular examination 10/09/2015  . Shortness of breath dyspnea   . Stroke ([place]) 10/2011  . Systolic congestive heart failure ([place]) 01/13/2017   Past Surgical History:  Procedure Laterality Date  . CARDIAC CATHETERIZATION    . ENDARTERECTOMY Right 10/10/2015   Procedure: ENDARTERECTOMY CAROTID-RIGHT;  Surgeon: Serafina Mitchell, MD;  Location: [facility] OR;  Service: Vascular;  Laterality: Right;  . IR RADIOLOGIST EVAL & MGMT  02/11/2017  . IR RADIOLOGIST EVAL & MGMT  03/24/2017    reports that he has never smoked. He has never used smokeless tobacco. He reports that he does not drink alcohol or use drugs. family history includes Aneurysm in his sister. No Known Allergies Current Outpatient Prescriptions on File Prior to Visit  Medication Sig Dispense Refill  . apixaban (ELIQUIS) 5 MG TABS tablet Take 1 tablet (5 mg total) by mouth 2 (two) times daily. 60 tablet 0  . aspirin EC 81 MG tablet Take 1 tablet (81 mg total) by mouth daily. 30 tablet 0  . atorvastatin (LIPITOR) 80 MG tablet Take 1 tablet (80 mg total) by mouth daily. 90 tablet 3  . carvedilol (COREG) 25 MG tablet Take 1 tablet (25 mg total) by mouth 2 (two) times daily. 180 tablet 3  . furosemide (LASIX) 40 MG tablet Take 1 tablet (40 mg total) by mouth daily. 30 tablet 0  . isosorbide mononitrate (IMDUR) 60 MG 24 hr tablet Take 1  tablet (60 mg total) by mouth daily. 30 tablet 0  . lisinopril (PRINIVIL,ZESTRIL) 2.5 MG tablet Take 1 tablet (2.5 mg total) by mouth daily. 30 tablet 0  . potassium chloride SA (K-DUR,KLOR-CON) 20 MEQ tablet Take 2 tablets (40 mEq total) by mouth daily. 30 tablet 0  . PROAIR RESPICLICK 902 (90 Base) MCG/ACT AEPB Inhale 2 puffs into the lungs every 6 (six) hours as needed (for wheezing or shortness of breath).     . senna-docusate (SENOKOT-S) 8.6-50 MG tablet Take 1 tablet by mouth at bedtime as  needed for mild constipation. 15 tablet 0  . tamsulosin (FLOMAX) 0.4 MG CAPS capsule Take 1 capsule (0.4 mg total) by mouth daily after supper. 90 capsule 3   No current facility-administered medications on file prior to visit.    Review of Systems  Constitutional: Negative for other unusual diaphoresis or sweats HENT: Negative for ear discharge or swelling Eyes: Negative for other worsening visual disturbances Respiratory: Negative for stridor or other swelling  Gastrointestinal: Negative for worsening distension or other blood Genitourinary: Negative for retention or other urinary change Musculoskeletal: Negative for other MSK pain or swelling Skin: Negative for color change or other new lesions Neurological: Negative for worsening tremors and other numbness  Psychiatric/Behavioral: Negative for worsening agitation or other fatigue All other system neg per pt    Objective:   Physical Exam BP (!) 142/90   Pulse 72   Temp 97.8 F (36.6 C) (Oral)   Ht 5' 6.5" (1.689 m)   Wt 160 lb (72.6 kg)   SpO2 99%   BMI 25.44 kg/m  VS noted,  Constitutional: Pt appears in NAD HENT: Head: NCAT.  Right Ear: External ear normal.  Left Ear: External ear normal.  Eyes: . Pupils are equal, round, and reactive to light. Conjunctivae and EOM are normal Nose: without d/c or deformity Neck: Neck supple. Gross normal ROM Cardiovascular: Normal rate and regular rhythm.   Pulmonary/Chest: Effort normal and breath sounds without rales or wheezing.  Neurological: Pt is alert. At baseline orientation, motor grossly intact Skin: Skin is warm. No rashes, other new lesions, no LE edema Psychiatric: Pt behavior is normal without agitation  No other exam findings Lab Results  Component Value Date   WBC 3.9 04/27/2017   HGB 10.8 (L) 04/27/2017   HCT 35.0 (L) 04/27/2017   PLT 198 04/27/2017   GLUCOSE 87 04/27/2017   CHOL 122 04/05/2017   TRIG 52 04/05/2017   HDL 21 (L) 04/05/2017   LDLDIRECT 230.0  01/20/2013   LDLCALC 91 04/05/2017   ALT 59 04/05/2017   AST 42 (H) 04/05/2017   NA 141 04/27/2017   K 3.9 04/27/2017   CL 102 04/27/2017   CREATININE 1.03 04/27/2017   BUN 13 04/27/2017   CO2 24 04/27/2017   TSH 0.856 04/05/2017   PSA 1.36 12/10/2015   INR 1.35 04/04/2017   HGBA1C 6.5 (H) 04/04/2017       Assessment & Plan:

## 2017-04-30 NOTE — Patient Outreach (Addendum)
[name]) Care Management   04/30/17  [name] Mar 04, 1941 [id number]  RNCM received RED EMMI alert today for 04/29/2017, patient answered "NO"  To the question "Know exactly which meds to take?"  RNCM also received a RED EMMI alert on 04/28/2017 for 04/27/2017 for patient "YES" to  "Lost interest in things they used to enjoy?"   Successful outreach completed with patient. Patient identification verified.   RNCM advised of missed home visit this week and patient stated that he had a doctor's appointment on Tuesday.   RNCM reviewed RED EMMI alerts with patient. Patient stated that he did not mean to answer that he has lost interest in things he used to enjoy and denies any issues with this. He did stated that he is having trouble with knowing if he is taking his medications correctly. Patient stated that when he got the EMMI call, he answered that way because he does not know the names of all of his medicines without looking at the bottles. He stated that he takes his medications by reading all of the bottles. Patient does have a doctor's appointment today with his PCP, Dr. Jenny [name]. RNCM encouraged patient to take all of his medication bottles with him to his appointment and to let Dr. Jenny [name] know he is having difficulty with his medications. Encouraged to call RNCM following his appointment if he has any additional questions or concerns related to his medications. Also advised will make a referral to [facility] pharmacist to help him with his medications.  Patient stated he would take his medications with him and stated he would like to talk with a pharmacist with [facility].  Patient denies any shortness of breath, chest pain or edema. He stated that his home health nurse came out and measured his legs. Stated that they also mentioned transportation, but have not followed up with him. Patient stated that his friend [name] can usually take him to all of his appointments, but at times, she is out  of town and he needs transportation so that he can get to all of his appointments. He stated that he has taken the bus before, but that he is usually always late when he does that and is wondering what is options are. RNCM advised that [place] has been trying to reach him, but has not been successful. Advised she has sent him a letter with her contact information, and that he will need to respond to her in order for her to be able to assist him. RNCM provided patient with [name] contact information and encouraged him to call her. Educated that she will be able to assist her to help with transportation resources, but it is important that he return her call in order to get this assistance.   Patient currently does not have any additional questions or concerns. Reminded patient before ending call to take all of his medications with him to this PCP appointment so that Dr. Jenny [name] can review them with him and make sure he is taking what he is supposed to take and patient verbalized understanding.  RNCM attempted to schedule home visit next week, but patient stated that he may be out of town. Requested call early in week to see if he will be home or out of town.   Plan: RNCM to continue to follow patient for heart failure and will attempt to reschedule home visit when patient is able.  [place] R. Chenel Wernli, Therapist, sports, BSN, CCM [facility] Care Management Coordinator (  [phone]   

## 2017-04-30 NOTE — Patient Instructions (Addendum)
Your medications were placed in the pill container today  Please continue all other medications as before, and refills have been done if requested.  Please have the pharmacy call with any other refills you may need.  Please keep your appointments with your specialists as you may have planned  Please go to the LAB in the Basement (turn left off the elevator) for the tests to be done today  You will be contacted by phone if any changes need to be made immediately.  Otherwise, you will receive a letter about your results with an explanation, but please check with MyChart first.  Please remember to sign up for MyChart if you have not done so, as this will be important to you in the future with finding out test results, communicating by private email, and scheduling acute appointments online when needed.  Please return in 4 months, or sooner if needed

## 2017-04-30 NOTE — Assessment & Plan Note (Signed)
stable overall by history and exam, recent data reviewed with pt, and pt to continue medical treatment as before,  to f/u any worsening symptoms or concerns BP Readings from Last 3 Encounters:  04/30/17 (!) 142/90  04/27/17 (!) 172/90  04/06/17 (!) 161/89

## 2017-04-30 NOTE — Assessment & Plan Note (Addendum)
With recent EF 40% and 2+ diast dysfxn, volume stable and apparently doing well with nursing support to make sure he gets his med at the right time regularly; will ask nurse today to fill his pill planner for the month as well today  Note:  Total time for pt hx, exam, review of record with pt in the room, determination of diagnoses and plan for further eval and tx is > 40 min, with over 50% spent in coordination and counseling of patient, including the further dx, tx, further evaluation and other management of CHF, CKD, HTN and med assistance efforts to promote compliance and maintenance of health status

## 2017-05-03 ENCOUNTER — Other Ambulatory Visit: Payer: Self-pay | Admitting: *Deleted

## 2017-05-03 ENCOUNTER — Other Ambulatory Visit: Payer: Self-pay

## 2017-05-03 ENCOUNTER — Encounter: Payer: Self-pay | Admitting: *Deleted

## 2017-05-03 NOTE — Patient Outreach (Signed)
[place] [facility] [facility]) Care Management  05/03/2017  [name] 1940/10/24 [id number]   Return call received from patient today, in response to the voicemail messages left for patient by CSW over the past several weeks.  CSW was able to perform the initial phone assessment, as well as assess and assist with social work needs and services.  CSW introduced self, explained role and types of services provided through [place] [facility] ([place] [facility]).  CSW further explained to patient that CSW works with patient's RNCM, also with [place] [facility], Tish Men. CSW then explained the reason for the call, indicating that Mrs. Satterfield thought that patient would benefit from social work services and resources to assist with arranging transportation to and from physician appointments, as well as providing patient with a list of food resources.  CSW obtained two HIPAA compliant identifiers from patient, which included patient's name and date of birth. In talking with patient, he admits having trouble obtaining food, as well as arranging for transportation to and from his physician appointments.  CSW agreed to mail patient the following list of resources: [employer] in [place] in [place] for [employer] through [employer] of [place] Consent for Treatment CSW also agreed to complete a [employer] Paramedic) application on [employer] and submit directly to [employer]) for processing.  CSW will then follow-up with patient in 10 days to ensure that he received the packet of resources, as well as answer any questions he may have at that time. [name], BSW, MSW, LCSW  Licensed Health visitor Health System  Mailing [place] [address] [place], [place] [address] Physical Address-[address], [place], [place] [address] Toll Free Main # [phone] Fax # [phone] Cell # [phone]  Office # [phone] [email or web address]

## 2017-05-03 NOTE — Patient Outreach (Signed)
[facility] [facility]) Care Management  05/03/17  SIDDIQ KALUZNY 04-18-1941 [id number]  RNCM attempted to reach patient without success. RNCM unable to leave a voicemail due to line being busy and no option to leave a message.  [place] R. Willoughby Doell, RN, BSN, [place] Management Coordinator [phone]

## 2017-05-04 NOTE — Patient Outreach (Signed)
Request received from Joanna Saporito, LCSW to mail patient personal care resources.  Information mailed today. 

## 2017-05-05 ENCOUNTER — Telehealth: Payer: Self-pay | Admitting: Pharmacist

## 2017-05-05 NOTE — Patient Outreach (Signed)
[facility]) Care Management  05/05/2017  [name] [birth year] [id number]  76 y.o. year old male referred to [place] for Medication Assistance  Patient states that he is having trouble buying medications but cannot tell me which medications are too expensive. He states that he is out of his home at this time and requests a call back number.   Plan:  Call back number provided, patient to return my call today.  Will follow up in 2 days if I do not hear from him.   Carlean Jews, Pharm.D. PGY2 Ambulatory Care Pharmacy Resident Phone: [phone]

## 2017-05-07 ENCOUNTER — Other Ambulatory Visit: Payer: Self-pay | Admitting: Pharmacist

## 2017-05-07 ENCOUNTER — Encounter: Payer: Self-pay | Admitting: *Deleted

## 2017-05-07 NOTE — Patient Outreach (Signed)
[facility]) Care Management  05/07/2017  [name] 23-Dec-1940 [id number]   76 y.o. year old male referred to [place] for Medication Assistance   Was unable to reach patient via telephone today and was unable to leave HIPAA compliant voicemail as mailbox was full (unsuccessful outreach #2).  Plan: Will followup in 5 days via telephone  Carlean Jews, Pharm.D. PGY2 Ambulatory Care Pharmacy Resident Phone: [phone]

## 2017-05-10 ENCOUNTER — Telehealth: Payer: Self-pay | Admitting: Internal Medicine

## 2017-05-10 NOTE — Telephone Encounter (Signed)
Ailene Ravel from [facility] called saying that she went to see the pt and he was not home and she was not able to get in touch with him by phone. She called him today and he told her that he had been in [place]. She asked for verbal orders to be able to continue to see the pt for medication management. He is still having a had time with his medications. Please advise.

## 2017-05-10 NOTE — Telephone Encounter (Signed)
Ok for verbals 

## 2017-05-11 NOTE — Telephone Encounter (Addendum)
Called [place] no answer LMOM w/MD response...[name]

## 2017-05-12 ENCOUNTER — Ambulatory Visit: Payer: Self-pay | Admitting: *Deleted

## 2017-05-12 ENCOUNTER — Other Ambulatory Visit: Payer: Self-pay | Admitting: *Deleted

## 2017-05-12 ENCOUNTER — Other Ambulatory Visit: Payer: Self-pay | Admitting: Pharmacist

## 2017-05-12 ENCOUNTER — Other Ambulatory Visit: Payer: Self-pay

## 2017-05-12 NOTE — Patient Outreach (Signed)
[facility] [facility]) Care Management  05/12/2017  [name] 03-12-1941 [id number]   76 y.o. year old male referred to [facility] for Medication Assistance [facility] telephone outreach)  Called patient to follow up on medication cost concerns. Endorses financial hardship and therefore, has not been able to pick up some medications. Buys all medications from [place].  Patient states that he cannot remember which medications he is out of because he is not at home. After questioning, patient states that he thinks he is out of some blood pressure medicines and this blood thinner. States that his doctor does not know about this. Patient was unable to confirm medication list with me over the phone at this time.  Patient denies changes in vision, balance or speech. Denies knowing signs and symptoms of stroke. Endorses swelling in legs and some difficulty walking at times. Denies shortness of breath, chest pain, dizziness, syncope.   Per [place]:  Last filled atorvastatin 30 day supply 05/05/2017  Last filled lisinopril 30 day supply 04/06/2017 Last filled carvedilol 90 day supply 04/27/2017 Has never filled furosemide or eliquis Eliquis copayment = $45  Patient states that he is unable to afford eliquis at this time with other copayments but thinks that if his other medications had $0 copayments, eliquis would be affordable at $45/month.   Plan:  #Medication Assistance - Patient has [place] and is NOT in the donut hole. Patient is eligible for mail-order pharmacy, in which case tier 1 and 2 generic medications may be available at $0 copay. Patient able to find insurance card with customer service number on back. States that he could call them tomorrow to have prescriptions transferred.  -Patient to transfer all medications to mail order pharmacy tomorrow EXCEPT eliquis as patient does not think he could afford a 90 day supply of eliquis;  instructed patient to pick up eliquis locally -Will follow up Friday to ensure successful completion of transfers -Educated on symptoms of a stroke and advised patient to call 911 and go to emergency department if experiencing stroke like symptoms.  -Asked patient to pick up prescribed furosemide at pharmacy to help with the swelling. Patient educated on purpose, proper use and potential adverse effects of furosemide. -If eliquis remains unaffordable, patient may be eligible for low income subsidy or manufacturer's assistance programs.  -Patient's primary care provider notified of the above  Carlean Jews, Pharm.D. PGY2 Ambulatory Care Pharmacy Resident Phone: [phone]

## 2017-05-12 NOTE — Patient Outreach (Signed)
[facility]) Care Management  05/12/2017  [name] April 07, 1941 [id number]   76 y.o. year old male referred to [place] for Medication Assistance  Patient states that some of his medications are expensive, however he was unable to discuss at the moment. He requested that I call back in ~1 hour.   PLAN:  Will call back later this afternoon.   Carlean Jews, Pharm.D. PGY2 Ambulatory Care Pharmacy Resident Phone: [phone]

## 2017-05-12 NOTE — Patient Outreach (Signed)
[facility] [facility]) Care Management  05/12/2017  [name] 1940-12-23 [id number]   CSW was able to make contact with patient today to follow-up regarding social work services and resources, as well as to ensure that patient received the packet of resource information mailed to his home by CSW.  Patient confirmed that he received the packet and will promptly begin using the financial resources provided to him, offered through various community agencies and resources.  However, patient reported that he has not received a call from a representative with [employer] [employer]) through [employer]) regarding his application for transportation services.  In talking with a representative from [place], [place] was told that they are still trying to process all the new applications received.  The representative is hopeful that someone will make an outreach call to patient within the next week to perform a telephone assessment.  CSW was able to report these findings to patient, agreeing to follow-up with patient next week to ensure that he has received a call from a representative with [employer].  Patient voiced understanding and was agreeable to this plan. [name], BSW, MSW, LCSW  Licensed Education officer, environmental Health System  Mailing [place] [address] [place], [place] [address] Physical Address-[address], [place], [place] [address] Toll Free Main # [phone] Fax # [phone] Cell # [phone]  Office # [phone] [email or web address]

## 2017-05-12 NOTE — Patient Outreach (Signed)
[place] [facility]) Care Management  05/12/17  OVIE CORNELIO May 29, 1941 [id number]  Attempted to reach patient without success. Left confidential voicemail with RNCM contact information requesting callback.   [place] R. Esbeidy Mclaine, RN, BSN, [place] Management Coordinator [phone]

## 2017-05-13 ENCOUNTER — Telehealth: Payer: Self-pay | Admitting: Internal Medicine

## 2017-05-13 DIAGNOSIS — N183 Chronic kidney disease, stage 3 (moderate): Secondary | ICD-10-CM | POA: Diagnosis not present

## 2017-05-13 DIAGNOSIS — Z7982 Long term (current) use of aspirin: Secondary | ICD-10-CM | POA: Diagnosis not present

## 2017-05-13 DIAGNOSIS — I480 Paroxysmal atrial fibrillation: Secondary | ICD-10-CM | POA: Diagnosis not present

## 2017-05-13 DIAGNOSIS — I252 Old myocardial infarction: Secondary | ICD-10-CM | POA: Diagnosis not present

## 2017-05-13 DIAGNOSIS — Z7951 Long term (current) use of inhaled steroids: Secondary | ICD-10-CM | POA: Diagnosis not present

## 2017-05-13 DIAGNOSIS — Z8679 Personal history of other diseases of the circulatory system: Secondary | ICD-10-CM | POA: Diagnosis not present

## 2017-05-13 DIAGNOSIS — Z7901 Long term (current) use of anticoagulants: Secondary | ICD-10-CM | POA: Diagnosis not present

## 2017-05-13 DIAGNOSIS — R7303 Prediabetes: Secondary | ICD-10-CM | POA: Diagnosis not present

## 2017-05-13 DIAGNOSIS — I5033 Acute on chronic diastolic (congestive) heart failure: Secondary | ICD-10-CM | POA: Diagnosis not present

## 2017-05-13 DIAGNOSIS — E785 Hyperlipidemia, unspecified: Secondary | ICD-10-CM | POA: Diagnosis not present

## 2017-05-13 DIAGNOSIS — I69354 Hemiplegia and hemiparesis following cerebral infarction affecting left non-dominant side: Secondary | ICD-10-CM | POA: Diagnosis not present

## 2017-05-13 DIAGNOSIS — I130 Hypertensive heart and chronic kidney disease with heart failure and stage 1 through stage 4 chronic kidney disease, or unspecified chronic kidney disease: Secondary | ICD-10-CM | POA: Diagnosis not present

## 2017-05-13 DIAGNOSIS — I2510 Atherosclerotic heart disease of native coronary artery without angina pectoris: Secondary | ICD-10-CM | POA: Diagnosis not present

## 2017-05-13 NOTE — Telephone Encounter (Signed)
Patient Name: [name]  DOB: [birth year]    Initial Comment Caller States his left shoulder started giving him some trouble down into his arm. It just started all of a sudden and he doesn't know why. It hurts mostly in his harm right above his muscle.    Nurse Assessment  Nurse: Raphael Gibney, RN, Vanita Ingles Date/Time (Eastern Time): 05/13/2017 2:34:43 PM  Confirm and document reason for call. If symptomatic, describe symptoms. ---Caller states he is having left shoulder and arm pain. Pain is mild. Pain started last night. Pain is intermittent. Pain level 1. No pain now. Pain lasts 5-6 min.  Does the patient have any new or worsening symptoms? ---Yes  Will a triage be completed? ---Yes  Related visit to physician within the last 2 weeks? ---No  Does the PT have any chronic conditions? (i.e. diabetes, asthma, etc.) ---Yes  List chronic conditions. ---HTN;  Is this a behavioral health or substance abuse call? ---No     Guidelines    Guideline Title Affirmed Question Affirmed Notes  Arm Pain [1] Age > 40 AND [2] no obvious cause AND [3] pain even when not moving the arm (Exception: pain is clearly made worse by moving arm or bending neck)    Final Disposition User   Go to ED Now Raphael Gibney, RN, Vanita Ingles    Comments  called primary number and left message. Will try secondary number  Called secondary number and left message. Will try primary number again in a few min  pt states he is not going to the ER. States he can not pay for all his medication. He can not pay for his isosobide, carvedilol; tamulosin;  called back line and spoke to [employer] and gave report that pt is having left arm pain which is mild and is lasting 5-6 min. Triage outcome of go to ER now but pt does not want to go to the ER.   Referrals  GO TO FACILITY REFUSED   Caller Disagree/Comply Disagree  Caller Understands Yes  PreDisposition Call Doctor

## 2017-05-14 ENCOUNTER — Ambulatory Visit: Payer: Self-pay | Admitting: Pharmacist

## 2017-05-14 ENCOUNTER — Telehealth: Payer: Self-pay | Admitting: Pharmacist

## 2017-05-14 ENCOUNTER — Other Ambulatory Visit: Payer: Self-pay

## 2017-05-14 NOTE — Patient Outreach (Signed)
[facility] [facility] [facility]) Care Management  05/14/17  [name] September 11, 1940 [id number]  RNCM received RED EMMI alert for patient on 05/13/2017  For 05/12/2017 and 05/11/2017 - patient had answered "no" to has patient weighed themselves today.  RNCM has made a third and final outreach attempt without success. Left HIPAA compliant voicemail with RNCM contact information and requested callback.   Plan- RNCM will send outreach barrier letter and wait 10 business days before completing case closure if no return call has been received.  [place] R. Jary Louvier, RN, BSN, [place] Management Coordinator [phone]

## 2017-05-14 NOTE — Patient Outreach (Signed)
[facility]) Care Management  05/14/2017  [name] Oct 08, 1940 [id number]   76 y.o. year old male referred to [place] for Medication Assistance [facility] telephone outreach f/u)  Called patient to follow up on transfer of medications to mail order pharmacy. No answer. Left HIPAA-compliant VM requesting he return my call.   Will follow up next week.  Carlean Jews, Pharm.D. PGY2 Ambulatory Care Pharmacy Resident Phone: [phone]

## 2017-05-19 ENCOUNTER — Other Ambulatory Visit: Payer: Self-pay | Admitting: Internal Medicine

## 2017-05-19 ENCOUNTER — Telehealth: Payer: Self-pay | Admitting: Pharmacist

## 2017-05-19 ENCOUNTER — Other Ambulatory Visit: Payer: Self-pay

## 2017-05-19 MED ORDER — CARVEDILOL 25 MG PO TABS
25.0000 mg | ORAL_TABLET | Freq: Two times a day (BID) | ORAL | 3 refills | Status: DC
Start: 2017-05-19 — End: 2018-01-27

## 2017-05-19 MED ORDER — FUROSEMIDE 40 MG PO TABS: 40.0000 mg | ORAL_TABLET | Freq: Every day | ORAL | 3 refills | Status: DC

## 2017-05-19 MED ORDER — ATORVASTATIN CALCIUM 80 MG PO TABS: 80.0000 mg | ORAL_TABLET | Freq: Every day | ORAL | 3 refills | Status: DC

## 2017-05-19 MED ORDER — LISINOPRIL 2.5 MG PO TABS: 2.5000 mg | ORAL_TABLET | Freq: Every day | ORAL | 3 refills | Status: DC

## 2017-05-19 MED ORDER — POTASSIUM CHLORIDE CRYS ER 20 MEQ PO TBCR: 40.0000 meq | EXTENDED_RELEASE_TABLET | Freq: Every day | ORAL | 3 refills | Status: DC

## 2017-05-19 MED ORDER — ISOSORBIDE MONONITRATE ER 60 MG PO TB24: 60.0000 mg | ORAL_TABLET | Freq: Every day | ORAL | 3 refills | Status: DC

## 2017-05-19 MED ORDER — TAMSULOSIN HCL 0.4 MG PO CAPS: 0.4000 mg | ORAL_CAPSULE | Freq: Every day | ORAL | 3 refills | Status: DC

## 2017-05-19 NOTE — Patient Outreach (Signed)
[facility]) Care Management  05/19/2017  [name] 1940/10/29 [id number]  76 y.o. year old male referred to [place] for Medication Assistance (Telephone Outreach)  Called patient to follow up on medication cost concerns and transfer of medication to mail order pharmacy. Patient reports that he hasn't gotten around to setting up mail order pharmacy yet. He requests assistance with this via 3-way telephone conference.   Reports that he now has someone that is going to fill his pill boxes every two weeks, which he is very appreciative of. Upon chart review, he has home visit scheduled with Craig Men, RN with [place] Management.    Still has not picked up eliquis. Asks why his "speech stutters" sometimes. Reports that this is NOT a new problem. Patient denies changes in vision, balance or speech. Reports knowing signs and symptoms of stroke. Endorses swelling in legs, improved, and some difficulty walking at times. Denies shortness of breath, chest pain, dizziness, syncope.   Patient states that he is unable to afford eliquis at this time with other copayments but thinks that if his other medications had $0 copayments, eliquis would be affordable at $45/month.   Plan:  #Medication Assistance - Patient has [name] and is NOT in the donut hole. Patient is eligible for mail-order pharmacy, in which case tier 1 and 2 generic medications may be available at $0 copay. Patient requests help with setting up mail order for his medications.  -With patient on the line, called [employer] and set up mail order account -Asked [facility] representative to transfer/obtain 90 day-supply prescriptions for the following medications for mail order: atorvastatin, carvedilol, furosemide, Lisinopril, isosorbide mononitrate, potassium chloride, tamsulosin. These medications should have a $0 copayment for the patient as long as they are 90  day-supplies.  -Will leave eliquis at local pharmacy as 90 day-supply is beyond patient's financial means, per patient. Patient states that he can afford eliquis at $45/month.  -Will follow up next week to ensure successful completion of transfers -Educated on symptoms of a stroke and advised patient to call 911 and go to emergency department if experiencing stroke like symptoms.  -If eliquis remains unaffordable, patient may be eligible for low income subsidy or manufacturer's assistance programs.  -Patient's primary care provider notified of the above  [name], Pharm.D. PGY2 Ambulatory Care Pharmacy Resident Phone: [phone]

## 2017-05-19 NOTE — Patient Outreach (Signed)
Patient triggered Red on EMMI Heart Failure Dashboard, notification sent to:  Tish Men, RN

## 2017-05-19 NOTE — Patient Outreach (Signed)
[facility] [facility]) Care Management  05/19/17  [name] 29-Dec-1940 [id number]  RNCM received RED EMMI alert on patient today for the following: Patient answered "no" to "weighed themselves" on 05/18/2017, 05/17/2017, 05/16/2017, 05/14/2017, 10/10 and 10/9 Patient also has answered "no" to "filled new prescriptions" on 10/12 RNCM sent unsuccessful contact letter to patient on 05/14/2017 due to inability to maintain contact with patient.  RNCM to make an additional outreach attempt due to RED EMMI alerts  Successful outreach completed with patient. Patient identification verified. RNCM educated patient on purpose of call and patient stated that he has not been weighing because he needs batteries for the scales. RNCM inquired if the scales had batteries when it was delivered and he stated that he did not find any. RNCM advised can bring him some batteries to his home, but he is uncertain of what kind it takes. He stated that he will look at the scales and call RNCM back with the batteries that are needed.  Patient denies any shortness of breath or swelling in his feet, hands and ankles. Then he stated that he can tell he has "just a little bit of fluid but it hasn't been bothering me." When asked how he could tell he has fluid, he stated "I can look at my foot and tell." He again stated, "but it is only a little bit and hasn't bothered me." He again denies any difficulty breathing or any other symptoms at present. Patient has no other questions/concerns at present but was agreeable to a home visit later this week. Plan: RNCM to provide patient with batteries for his scales and will complete a home visit later this week to assess patient. [place] R. Jennica Tagliaferri, RN, BSN, [place] Management Coordinator [phone]

## 2017-05-20 ENCOUNTER — Other Ambulatory Visit: Payer: Self-pay | Admitting: *Deleted

## 2017-05-20 NOTE — Patient Outreach (Signed)
Patient triggered Red on EMMI Heart Failure Dashboard, notification sent to:  Tish Men, RN

## 2017-05-20 NOTE — Patient Outreach (Signed)
[facility]) Care Management  05/20/2017  [name] March 07, 1941 [id number]   CSW was able to make contact with patient today to follow-up regarding social work services and resources, as well as to ensure that patient received a call from a representative with [employer] [employer]), through [employer] regarding recent Chief Operating Officer.  Patient reported that he has already initiated his phone interview and has an appointment scheduled for November 1st to actually meet with a representative from [employer] to complete his assessment.  Patient denied having any physician appointments between now and November 1st, for which he would require transportation assistance. CSW will perform a case closure on patient, as all goals of treatment have been met from social work standpoint and no additional social work needs have been identified at this time.  CSW will notify patient's RNCM with [place] Management, Tish Men of CSW's plans to close patient's case.  CSW will fax an update to patient's Primary Care Physician, Dr. Cathlean Cower to ensure that they are aware of CSW's involvement with patient's plan of care.  CSW will submit a case closure request to Verlon Setting, Care Management Assistant with [place] Management, in the form of an [employer].  CSW will ensure that Mrs. Comer is aware of Mrs. Satterfield's, RNCM with [employer], continued involvement with patient's care. [name], BSW, MSW, LCSW  Licensed Education officer, environmental Health System  Mailing [place] [address] [place], [place] [address] Physical Address-[address], [place], [place] [address] Toll Free Main # [phone] Fax # [phone] Cell # [phone]  Office #  [phone] [email or web address]

## 2017-05-21 ENCOUNTER — Other Ambulatory Visit: Payer: Self-pay

## 2017-05-21 DIAGNOSIS — I5033 Acute on chronic diastolic (congestive) heart failure: Secondary | ICD-10-CM | POA: Diagnosis not present

## 2017-05-21 DIAGNOSIS — Z8679 Personal history of other diseases of the circulatory system: Secondary | ICD-10-CM | POA: Diagnosis not present

## 2017-05-21 DIAGNOSIS — N183 Chronic kidney disease, stage 3 (moderate): Secondary | ICD-10-CM | POA: Diagnosis not present

## 2017-05-21 DIAGNOSIS — E785 Hyperlipidemia, unspecified: Secondary | ICD-10-CM | POA: Diagnosis not present

## 2017-05-21 DIAGNOSIS — Z7901 Long term (current) use of anticoagulants: Secondary | ICD-10-CM | POA: Diagnosis not present

## 2017-05-21 DIAGNOSIS — I252 Old myocardial infarction: Secondary | ICD-10-CM | POA: Diagnosis not present

## 2017-05-21 DIAGNOSIS — I2510 Atherosclerotic heart disease of native coronary artery without angina pectoris: Secondary | ICD-10-CM | POA: Diagnosis not present

## 2017-05-21 DIAGNOSIS — I130 Hypertensive heart and chronic kidney disease with heart failure and stage 1 through stage 4 chronic kidney disease, or unspecified chronic kidney disease: Secondary | ICD-10-CM | POA: Diagnosis not present

## 2017-05-21 DIAGNOSIS — Z7982 Long term (current) use of aspirin: Secondary | ICD-10-CM | POA: Diagnosis not present

## 2017-05-21 DIAGNOSIS — R7303 Prediabetes: Secondary | ICD-10-CM | POA: Diagnosis not present

## 2017-05-21 DIAGNOSIS — Z7951 Long term (current) use of inhaled steroids: Secondary | ICD-10-CM | POA: Diagnosis not present

## 2017-05-21 DIAGNOSIS — I69354 Hemiplegia and hemiparesis following cerebral infarction affecting left non-dominant side: Secondary | ICD-10-CM | POA: Diagnosis not present

## 2017-05-21 DIAGNOSIS — I480 Paroxysmal atrial fibrillation: Secondary | ICD-10-CM | POA: Diagnosis not present

## 2017-05-21 NOTE — Patient Outreach (Signed)
Patient triggered Red on [place] Heart Failure Dashboard, notification sent to Tish Men, RN

## 2017-05-25 ENCOUNTER — Other Ambulatory Visit: Payer: Self-pay | Admitting: Pharmacist

## 2017-05-25 NOTE — Patient Outreach (Signed)
[facility]) Care Management  05/25/2017  [name] Aug 20, 1940 [id number]  76 y.o. year old male referred to [place] for Medication Assistance [facility] telephone outreach)   PMH s/f:  Past Medical History:  Diagnosis Date  . Anemia, iron deficiency 08/15/2011  . CAD (coronary artery disease)    cath in 1/08: EF 60%, mild dilated Ao root, oD2 30%, LAD 20-30%.  . Dilated aortic root ([place]) 08/15/2011  . Dyslipidemia   . Dysrhythmia   . History of MI (myocardial infarction) 08/15/2011  . History of pneumonia 08/15/2011  . History of stroke   . Hyperlipidemia   . Hypertension    echocardiogram 6/10: Moderate LVH, EF 55-65%, mild, mild MR, MAC, mild LAE, PASP 32  . Myocardial infarction ([place])   . Paroxysmal atrial fibrillation (HCC)   . Prediabetes 10/08/2015  . Preoperative cardiovascular examination 10/09/2015  . Shortness of breath dyspnea   . Stroke ([place]) 10/2011  . 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 He reports that medications came in the mail yesterday. States that he took all medications today and does have a pill box. Also reports that he has a operational scale and his weight is down to 163 today. Reports improved breathing, denies chest pain. Has not gotten Eliquis yet from pharmacy, however gets paid tomorrow and states that he thinks he can pick up the medication at the pharmacy.  Patient denies changes in vision, balance or speech. Reports knowing signs and symptoms of stroke. Endorses swelling in legs, improved, and some difficulty walking at times. Denies shortness of breath, chest pain, dizziness, syncope.    Plan:  #Medication Assistance- Patient has [place] and is NOT in the donut hole. Patient is now set up with mail-order pharmacy, in which case tier 1 and 2 generic medications may be available at $0 copay. He will obtain Eliquis at  local pharmacy for 30-day supply.  -Will follow up with patient in no less than 3 days to determine if he was able to afford Eliquis at the pharmacy. -If eliquis remains unaffordable, patient may be eligible for low income subsidy or manufacturer's assistance programs.   Carlean Jews, Pharm.D. PGY2 Ambulatory Care Pharmacy Resident Phone: [phone]

## 2017-05-26 NOTE — Patient Outreach (Signed)
[place] [facility]) Care Management  [facility] Care Manager  Late entry for 05/21/2017  [name] Jun 10, 1941 [id number]   Home visit completed with patient.  Subjective: Patient stated he has been feeling pretty good.   Objective: Blood pressure (!) 194/98, pulse 66, height 1.689 m (5' 6.5"), weight 168 lb 12.8 oz (76.6 kg), SpO2 97 %. *Patient had not taken his medications yet at the time of visit Encounter Medications:  Outpatient Encounter Prescriptions as of 05/21/2017  Medication Sig  . aspirin EC 81 MG tablet Take 1 tablet (81 mg total) by mouth daily.  Marland Kitchen atorvastatin (LIPITOR) 80 MG tablet Take 1 tablet (80 mg total) by mouth daily.  . carvedilol (COREG) 25 MG tablet Take 1 tablet (25 mg total) by mouth 2 (two) times daily.  . isosorbide mononitrate (IMDUR) 60 MG 24 hr tablet Take 1 tablet (60 mg total) by mouth daily.  . potassium chloride SA (K-DUR,KLOR-CON) 20 MEQ tablet Take 2 tablets (40 mEq total) by mouth daily.  . tamsulosin (FLOMAX) 0.4 MG CAPS capsule Take 1 capsule (0.4 mg total) by mouth daily after supper.  Marland Kitchen apixaban (ELIQUIS) 5 MG TABS tablet Take 1 tablet (5 mg total) by mouth 2 (two) times daily. (Patient not taking: Reported on 05/12/2017)  . furosemide (LASIX) 40 MG tablet Take 1 tablet (40 mg total) by mouth daily.  Marland Kitchen lisinopril (PRINIVIL,ZESTRIL) 2.5 MG tablet Take 1 tablet (2.5 mg total) by mouth daily.  Marland Kitchen PROAIR RESPICLICK 299 (90 Base) MCG/ACT AEPB Inhale 2 puffs into the lungs every 6 (six) hours as needed (for wheezing or shortness of breath).   . senna-docusate (SENOKOT-S) 8.6-50 MG tablet Take 1 tablet by mouth at bedtime as needed for mild constipation.   No facility-administered encounter medications on file as of 05/21/2017.     Functional Status:  In your present state of health, do you have any difficulty performing the following activities: 05/03/2017 04/04/2017  Hearing? N N  Vision? N N  Difficulty concentrating or making  decisions? N N  Walking or climbing stairs? Y Y  Dressing or bathing? N N  Doing errands, shopping? N N  Preparing Food and eating ? N -  Using the Toilet? N -  In the past six months, have you accidently leaked urine? N -  Do you have problems with loss of bowel control? N -  Managing your Medications? N -  Managing your Finances? Y -  Housekeeping or managing your Housekeeping? Y -  Some recent data might be hidden    Fall/Depression Screening: Fall Risk  05/03/2017 04/16/2017 03/13/2016  Falls in the past year? No No No  Risk for fall due to : Impaired balance/gait;Impaired mobility;Medication side effect Impaired balance/gait;Medication side effect -   PHQ 2/9 Scores 05/03/2017 04/16/2017 03/13/2016 03/11/2016 06/26/2014  PHQ - 2 Score 0 0 0 0 0    Assessment:  RNCM has been receiving multiple red emmi alerts on patient due to not weighing himself. Patient had reported that the scales that were delivered to his home did not have batteries and he had not had the opportunity to get them. RNCM brought batteries and installed them today. Patient now has a set of working scales. RNCM provided education about the importance of weighing daily at the same time first thing in the morning after going to the bathroom, but before eating or drinking anything. Provided education about the heart failure zones and education about monitoring his daily weight. Provided patient with a  [facility] Calendar and instructed him to write his daily weights in there. Patient verbalized understanding.  Patient was hypertensive at home visit. Patient stated that he had not yet taken his morning medications. Patient does have pill boxes and stated that he can manage his medications on his own, however he reported that he does have a friend who used to work at the hospital who helps him at time. Patient was not symptomatic and denied any headaches, chest pain, shortness of breath, numbness/tingling, etc. RNCM instructed patient to  go ahead and take his medications. Patient had all of his medications except his furosemide and eliquis. He stated that he has been taking the furosemide and had just ordered it. He stated that he has been working with [facility] pharmacist regarding the eliquis and has found out that if he gets all of his medicines through mail order, he will not have a copay and the eliqiuis will be $45 a month. He stated that he can afford that and plans to pick it up, but has not yet done so.  RNCM provided patient with a digital blood pressure cuff during home visit and encouraged him to use it. RNCM demonstrated for patient how to use the cuff and provided instructions and patient verbalized understanding. RNCM encouraged patient to take his blood pressure daily, at least an hour or two after taking his medications and encouraged him to write them down. Encouraged him to take his blood pressure again today to make sure that it comes down after taking his medication and he verbalized understanding.  Patient reported that he does not have any swelling in his legs or feet, however, upon exam, patient does have 1+ pitting edema in bilateral lower extremities. RNCM demonstrated for patient how to look at his legs and feel them to see if he has increased swelling. Patient verbalized understanding and stated that he did not realize that was fluid in his legs.   Patient currently has no other complaints or concerns. Spent most of visit providing education and instruction on importance of daily weights, heart failure zones and symptoms to report and monitoring his blood pressure at home.  Plan: RNCM to follow up with patient in 2 weeks to discuss his blood pressure readings and weights and patient was agreeable to plan.  THN CM Care Plan Problem One     Most Recent Value  Care Plan Problem One  Knowledge deficie related to heart failure diagnosis  Role Documenting the Problem One  Care Management Coordinator  Care Plan for  Problem One  Active  THN Long Term Goal   Patient will not have a hospital admission within the next 31 days  THN Long Term Goal Start Date  05/21/17  Interventions for Problem One Long Term Goal  RNCM provided patient with a set of scales to monitor daily weights,  provided education about heart failure zones and when to call the doctor  [facility] CM Short Term Goal #1   Patient will weigh himself daily for the next 2 weeks  THN CM Short Term Goal #1 Start Date  05/21/17  Interventions for Short Term Goal #1  RNCM provided patient with a set of scales,  also provided patient with batteries and installed them and demonstrated how to use scales,  educatted patient on when to weigh, to document them daily and to watch for 3 lb weight gain in 24 hours or 5 lbs weight gain in a week and what to do if he observes  THN CM Short Term Goal #2   Patient will monitor his blood pressure daily for the next 2 weeks  THN CM Short Term Goal #2 Start Date  05/21/17  Interventions for Short Term Goal #2  RNCM provided patient with a digital blood pressure cuff and demonstrated how to use at home,  encouraged patient to monitor his blood pressure daily and write them down to take to his doctor     [place] R. Conita Amenta, RN, BSN, [place] Management Coordinator [phone]

## 2017-05-28 ENCOUNTER — Ambulatory Visit: Payer: Self-pay | Admitting: Pharmacist

## 2017-05-28 ENCOUNTER — Telehealth: Payer: Self-pay | Admitting: Pharmacist

## 2017-05-28 NOTE — Patient Outreach (Signed)
[place] [facility]) Care Management  05/28/2017  [name] 01-31-1941 [id number]   76 y.o. year old male referred to [place] for Medication Assistance [facility] telephone outreach)   Was unable to reach patient via telephone today and have left HIPAA compliant voicemail asking patient to return my call (unsuccessful outreach #1).  Plan: Will followup in no more than 5 days via telephone   Carlean Jews, Pharm.D. PGY2 Ambulatory Care Pharmacy Resident Phone: [phone]

## 2017-06-01 ENCOUNTER — Telehealth: Payer: Self-pay | Admitting: Pharmacist

## 2017-06-01 NOTE — Patient Outreach (Signed)
[place] [facility]) Care Management  06/01/2017  LUISALBERTO BEEGLE 02-10-1941 [id number]   76 y.o. year old male referred to [place] for Medication Assistance [facility] telephone outreach)  Called to follow up on eliquis affordability - patient states that he still has not purchased Eliquis yet. Gets paid this Friday and thinks he can pay for it then. Requests that I call next week to see if he was able to purchase it.   Plan Will follow up next Tuesday afternoon If patient still has not purchased Eliquis, will look into patient assistance options.  Carlean Jews, Pharm.D. PGY2 Ambulatory Care Pharmacy Resident Phone: [phone]

## 2017-06-02 ENCOUNTER — Ambulatory Visit: Payer: Self-pay | Admitting: Pharmacist

## 2017-06-02 ENCOUNTER — Ambulatory Visit: Payer: Self-pay

## 2017-06-07 ENCOUNTER — Other Ambulatory Visit: Payer: Self-pay

## 2017-06-07 NOTE — Patient Outreach (Signed)
[place] [facility]) Care Management  [place]  06/07/2017   [name] November 23, 1940 [id number]   Home visit completed with patient.   Subjective: Patient stated that he has been doing good but he does not know how to tell if his blood pressure is too high or too low.   Objective: BP (!) 171/89 (BP Location: Right Arm, Patient Position: Sitting, Cuff Size: Normal)   Pulse 71   Wt 164 lb (74.4 kg)   BMI 26.07 kg/m    Encounter Medications:  Outpatient Encounter Medications as of 06/07/2017  Medication Sig  . aspirin EC 81 MG tablet Take 1 tablet (81 mg total) by mouth daily.  Marland Kitchen atorvastatin (LIPITOR) 80 MG tablet Take 1 tablet (80 mg total) by mouth daily.  . carvedilol (COREG) 25 MG tablet Take 1 tablet (25 mg total) by mouth 2 (two) times daily.  . furosemide (LASIX) 40 MG tablet Take 1 tablet (40 mg total) by mouth daily.  . isosorbide mononitrate (IMDUR) 60 MG 24 hr tablet Take 1 tablet (60 mg total) by mouth daily.  Marland Kitchen lisinopril (PRINIVIL,ZESTRIL) 2.5 MG tablet Take 1 tablet (2.5 mg total) by mouth daily.  . potassium chloride SA (K-DUR,KLOR-CON) 20 MEQ tablet Take 2 tablets (40 mEq total) by mouth daily.  Marland Kitchen PROAIR RESPICLICK 001 (90 Base) MCG/ACT AEPB Inhale 2 puffs into the lungs every 6 (six) hours as needed (for wheezing or shortness of breath).   . senna-docusate (SENOKOT-S) 8.6-50 MG tablet Take 1 tablet by mouth at bedtime as needed for mild constipation.  . tamsulosin (FLOMAX) 0.4 MG CAPS capsule Take 1 capsule (0.4 mg total) by mouth daily after supper.  Marland Kitchen apixaban (ELIQUIS) 5 MG TABS tablet Take 1 tablet (5 mg total) by mouth 2 (two) times daily. (Patient not taking: Reported on 05/12/2017)   No facility-administered encounter medications on file as of 06/07/2017.     Functional Status:  In your present state of health, do you have any difficulty performing the following activities: 05/03/2017 04/04/2017  Hearing? N N  Vision? N N  Difficulty  concentrating or making decisions? N N  Walking or climbing stairs? Y Y  Dressing or bathing? N N  Doing errands, shopping? N N  Preparing Food and eating ? N -  Using the Toilet? N -  In the past six months, have you accidently leaked urine? N -  Do you have problems with loss of bowel control? N -  Managing your Medications? N -  Managing your Finances? Y -  Housekeeping or managing your Housekeeping? Y -  Some recent data might be hidden    Fall/Depression Screening: Fall Risk  05/03/2017 04/16/2017 03/13/2016  Falls in the past year? No No No  Risk for fall due to : Impaired balance/gait;Impaired mobility;Medication side effect Impaired balance/gait;Medication side effect -   PHQ 2/9 Scores 05/03/2017 04/16/2017 03/13/2016 03/11/2016 06/26/2014  PHQ - 2 Score 0 0 0 0 0    Assessment:  Patient continues NOT to take Eliquis. He stated that he just cannot afford to get it filled.   Patient has attempted to check his blood pressure, but stated that he is not sure how to do it correctly and also does not know how to know if it is too high RNCM provided demonstration for patient with his blood pressure cuff on how to take his blood pressure and had him return demonstrate. RNCM provided education about signs and symptoms of hypertension and discussed a goal  blood pressure of 140/90 - but will need to discuss what his doctor's goal is for him as well. For now, instructed that his blood pressure is considered high if it is over 140/90 and the goal is to get it more controlled and near that target blood pressure. Encouraged to take his medications as prescribed and discussed diet as well. After a brief discussion with patient, he mentioned that he eats a lot of high sodium foods, so we will work more closely on diet at future visits. RNCM encouraged patient to take his blood pressure daily and write it in the [facility] tool that was given to him. Demonstrated how to write down his readings and to note how he  was feeling on those days, especially if he observes that his reading is above the goal.   Also encouraged patient to weigh daily and write it down. Patient stated that he has been weighing, but has not been writing it down. Educated on the importance of writing his weights down and discussed how to monitor for 3 lbs weight gain in 24 hours and 5 lbs weight gain in a week. Explained that sometimes it is difficult to notice when to call the doctor if he is not writing the weights down since he mentioned that they are "up and down" at times. RNCM showed patient how and where to write down his weights in his [facility] tool and patient verbalized understanding.  Patient has denies any chest pain or shortness of breath. He stated he has been feeling generally well, although he does fees weakness at times. He stated that his doctor told him that it comes from his brain. He stated that they will not do surgery on him but he is concerned about the blockages.   Patient's pillboxes were empty at home visit and patient stated that they have been empty for 2-3 days. He reported that his friend had offered to fill them when it was needed, but she had been busy a lot lately and he did not want to impose. He stated that he has been taking his medications from his pill bottles and "just reads them and takes it how it says." He did express confusion about his potassium because he had 2 bottles of it and they were different colors because they came from different pharmacies. RNCM assessed if patient had taken his potassium from both bottles, but he denies despite being confused about having multiple bottles. He also stated that he thought that was his blood pressure medication. RNCM reviewed patient's medications with him and explained what each medication was and what it was for. Patient will likely need further education on this as well. RNCM filled patient's pillboxes for 2 weeks and encouraged him to only take medications from  the pillboxes as they have all of his scheduled medications in there. Patient verbalized understanding and knows where to start taking his medication from.   Patient has no other concerns at this visit.  Plan: RNCM to follow up with patient within 1-2 weeks to ensure he is understanding how to take his blood pressure and that he is writing it and his daily weights down, will begin education about diet and hypertension, and will also follow up to ensure his friend is going to continue to fill his pillbox.   [place] R. Lyndal Alamillo, RN, BSN, [place] Management Coordinator [phone]

## 2017-06-08 ENCOUNTER — Telehealth: Payer: Self-pay | Admitting: Pharmacist

## 2017-06-08 ENCOUNTER — Ambulatory Visit: Payer: Self-pay | Admitting: Pharmacist

## 2017-06-08 NOTE — Patient Outreach (Signed)
[place] [facility]) Care Management  06/08/2017  [name] 1940-08-21 [id number]   76 y.o. year old male referred to [place] for Medication Assistance [facility] telephone outreach)   Was unable to reach patient via telephone today and have left HIPAA compliant voicemail asking patient to return my call (unsuccessful outreach #1).  Plan: Will followup in 3 days via telephone

## 2017-06-11 ENCOUNTER — Ambulatory Visit: Payer: Self-pay | Admitting: Pharmacist

## 2017-06-11 ENCOUNTER — Other Ambulatory Visit: Payer: Self-pay | Admitting: Pharmacist

## 2017-06-11 NOTE — Patient Outreach (Signed)
[place] [facility]) Care Management  06/11/2017  JONTA GASTINEAU Dec 02, 1940 [id number]   76 y.o. year old male referred to [place] for Medication Assistance [facility] telephone outreach f/u)   Was unable to reach patient via telephone today and have left HIPAA compliant voicemail asking patient to return my call (unsuccessful outreach #2).  Plan: Will followup in 3 days via telephone   Carlean Jews, Pharm.D. PGY2 Ambulatory Care Pharmacy Resident Phone: [phone]

## 2017-06-14 ENCOUNTER — Ambulatory Visit: Payer: Medicare Other | Admitting: Physician Assistant

## 2017-06-14 NOTE — Progress Notes (Deleted)
Cardiology Office Note    Date:  06/14/2017   ID:  LENNYN BELLANCA, DOB 05-22-1941, MRN [id number]  PCP:  Biagio Borg, MD  Cardiologist: Formerly Dr. Lia Foyer now Dr. Meda Coffee  No chief complaint on file.   History of Present Illness:  [name] is a 76 y.o. male with history of nonobstructive CAD on cath in 2008, PAF CHADSVASC=7 on Eliquis, history of CVA status post right carotid endarterectomy, difficult to control hypertension HLD was admitted to the hospital 04/2017 with chest pain felt secondary to hypertensive urgency with CHF secondary to noncompliance and inability to afford his medication for 3 months. Troponin was minimally elevated felt secondary to demand ischemia. Had negative T waves in inferior lateral leads outpatient stress test recommended.  2D echo 04/05/17 LVEF 40-45% with moderate diffuse hypokinesis and grade 2 DD.  I saw the patient 04/27/17 and his blood pressure was still high.  Patient was eating fast foods.  He said he was taken his medications but did not bring them with him.  I increased his Coreg to 25 mg twice daily and gave him a 2 g sodium diet.    Past Medical History:  Diagnosis Date  . Anemia, iron deficiency 08/15/2011  . CAD (coronary artery disease)    cath in 1/08: EF 60%, mild dilated Ao root, oD2 30%, LAD 20-30%.  . Dilated aortic root ([place]) 08/15/2011  . Dyslipidemia   . Dysrhythmia   . History of MI (myocardial infarction) 08/15/2011  . History of pneumonia 08/15/2011  . History of stroke   . Hyperlipidemia   . Hypertension    echocardiogram 6/10: Moderate LVH, EF 55-65%, mild, mild MR, MAC, mild LAE, PASP 32  . Myocardial infarction ([place])   . Paroxysmal atrial fibrillation (HCC)   . Prediabetes 10/08/2015  . Preoperative cardiovascular examination 10/09/2015  . Shortness of breath dyspnea   . Stroke ([place]) 10/2011  . 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 CARDIAC  CATHETERIZATION    . IR RADIOLOGIST EVAL & MGMT  02/11/2017  . IR RADIOLOGIST EVAL & MGMT  03/24/2017    Current Medications: No outpatient medications have been marked as taking for the 06/14/17 encounter (Appointment) with Imogene Burn, PA-C.     Allergies:   Patient has no known allergies.   Social History   Socioeconomic History  . Marital status: Widowed    Spouse name: Not on file  . Number of children: 2  . Years of education: Not on file  . Highest education level: Not on file  Social Needs  . Financial resource strain: Not on file  . Food insecurity - worry: Not on file  . Food insecurity - inability: Not on file  . Transportation needs - medical: Not on file  . Transportation needs - non-medical: Not on file  Occupational History  . Occupation: retired  Tobacco Use  . Smoking status: Never Smoker  . Smokeless tobacco: Never Used  Substance and Sexual Activity  . Alcohol use: No  . Drug use: No  . Sexual activity: Not on file  Other Topics Concern  . Not on file  Social History Narrative  . Not on file     Family History:  The patient's ***family history includes Aneurysm in his sister.   ROS:   Please see the history of present illness.    ROS All other systems reviewed and are negative.  PHYSICAL EXAM:   VS:  There were no vitals taken for this visit.  Physical Exam  GEN: Well nourished, well developed, in no acute distress  HEENT: normal  Neck: no JVD, carotid bruits, or masses Cardiac:RRR; no murmurs, rubs, or gallops  Respiratory:  clear to auscultation bilaterally, normal work of breathing GI: soft, nontender, nondistended, + BS Ext: without cyanosis, clubbing, or edema, Good distal pulses bilaterally MS: no deformity or atrophy  Skin: warm and dry, no rash Neuro:  Alert and Oriented x 3, Strength and sensation are intact Psych: euthymic mood, full affect  Wt Readings from Last 3 Encounters:  06/07/17 164 lb (74.4 kg)  05/21/17 168 lb  12.8 oz (76.6 kg)  04/30/17 160 lb (72.6 kg)      Studies/Labs Reviewed:   EKG:  EKG is*** ordered today.  The ekg ordered today demonstrates ***  Recent Labs: 01/28/2017: Pro B Natriuretic peptide (BNP) 1,033.0 04/03/2017: B Natriuretic Peptide 2,236.3 04/04/2017: Magnesium 1.8 04/05/2017: TSH 0.856 04/30/2017: ALT 11; BUN 14; Creatinine, Ser 1.08; Hemoglobin 11.0; Platelets 181.0; Potassium 3.8; Sodium 138   Lipid Panel    Component Value Date/Time   CHOL 122 04/05/2017 0352   TRIG 52 04/05/2017 0352   HDL 21 (L) 04/05/2017 0352   CHOLHDL 5.8 04/05/2017 0352   VLDL 10 04/05/2017 0352   LDLCALC 91 04/05/2017 0352   LDLDIRECT 230.0 01/20/2013 1547    Additional studies/ records that were reviewed today include:  Echocardiogram 04/05/17 Study Conclusions   - Left ventricle: The cavity size was normal. There was severe   concentric hypertrophy. Systolic function was mildly to   moderately reduced. The estimated ejection fraction was in the   range of 40% to 45%. Moderate diffuse hypokinesis with no   identifiable regional variations. Features are consistent with a   pseudonormal left ventricular filling pattern, with concomitant   abnormal relaxation and increased filling pressure (grade 2   diastolic dysfunction). - Aortic valve: There was trivial regurgitation. - Left atrium: The atrium was mildly dilated. - Right atrium: The atrium was mildly dilated. - Atrial septum: No defect or patent foramen ovale was identified.       ASSESSMENT:    No diagnosis found.   PLAN:  In order of problems listed above:      Medication Adjustments/Labs and Tests Ordered: Current medicines are reviewed at length with the patient today.  Concerns regarding medicines are outlined above.  Medication changes, Labs and Tests ordered today are listed in the Patient Instructions below. There are no Patient Instructions on file for this visit.   Sumner Boast, PA-C  06/14/2017  12:29 PM    [facility] [place], [place], [place]  [address] Phone: [phone]; Fax: [phone]

## 2017-06-15 ENCOUNTER — Encounter: Payer: Self-pay | Admitting: Physician Assistant

## 2017-06-16 ENCOUNTER — Ambulatory Visit: Payer: Self-pay | Admitting: Pharmacist

## 2017-06-16 ENCOUNTER — Telehealth: Payer: Self-pay | Admitting: Pharmacist

## 2017-06-16 NOTE — Patient Outreach (Signed)
[place] [facility]) Care Management  06/16/2017  [name] Dec 11, 1940 [id number]   76 y.o.year old malereferred to [place] for Medication Assistance [facility] telephone outreach)  Called to follow up on eliquis affordability - patient states that he still has not purchased Eliquis yet. Patient states that he can "maybe" pick up the Eliquis next week. Patient states that he has all of his other medications. Discussed starting patient assistance applications to the drug manufacturer however patient declines at this time. Requests that I call next week to see if he was able to purchase it. When I offered my telephone number for follow up, patient politely declines.   Have    Plan:  #Medication Assistance- Patient has [place] and is NOT in the donut hole. Patient is now set up with mail-order pharmacy, in which case tier 1 and 2 generic medications are $0. He has stated that he will obtain Eliquis at local pharmacy for 30-day supply, however; patient has been trying to obtain Eliquis since 05/19/17 each week and has been unable to do so. Patient refusing manufacturer assistance applications at this time.   -Will follow up next Friday per patient request -If patient still has not purchased Eliquis, will again offer patient assistance options -If patient again refuses patient assistance applications next week and has still not obtained Eliquis, will have to close case and notify providers as attempts to obtain Eliquis have been ongoing since 05/05/2017.   Carlean Jews, Pharm.D. PGY2 Ambulatory Care Pharmacy Resident Phone: [phone]

## 2017-06-18 ENCOUNTER — Encounter: Payer: Self-pay | Admitting: Neurology

## 2017-06-25 ENCOUNTER — Telehealth: Payer: Self-pay | Admitting: Pharmacist

## 2017-06-25 ENCOUNTER — Ambulatory Visit: Payer: Self-pay | Admitting: Pharmacist

## 2017-06-25 NOTE — Patient Outreach (Signed)
[facility]) Care Management  06/25/2017  [name] 02-10-1941 [id number]  76 y.o. year old male referred to [place] for No chief complaint on file.   Was unable to reach patient via telephone today and have left HIPAA compliant voicemail asking patient to return my call (unsuccessful outreach #1).  Plan: Will followup within 7 days via telephone

## 2017-06-28 ENCOUNTER — Ambulatory Visit: Payer: Medicare Other | Admitting: Physician Assistant

## 2017-07-08 ENCOUNTER — Other Ambulatory Visit: Payer: Self-pay

## 2017-07-08 NOTE — Patient Outreach (Signed)
[facility]) Care Management  07/08/17  [name] [birth year] [id number]  Attempted to reach patient without success. Unable to leave voicemail due to message stating that "The number you are trying to reach is unreachable" with no option to leave a message.  [name], RN, BSN, [place] Management Coordinator [phone]

## 2017-07-13 ENCOUNTER — Telehealth: Payer: Self-pay | Admitting: Pharmacist

## 2017-07-13 NOTE — Patient Outreach (Signed)
[facility]) Care Management  07/13/2017  [name] Jun 08, 1941 [id number]   76 y.o. year old male referred to [place] for Medication Assistance (Pharmacy telephone follow up)   Was unable to reach patient via telephone today and was unable to leave HIPAA compliant voicemail asking patient to return my call as the number is "not reachable" (unsuccessful outreach #2).  Plan: Will followup within 5 business days via telephone   Carlean Jews, Berino.D., BCPS PGY2 Ambulatory Care Pharmacy Resident Phone: [phone]

## 2017-07-16 ENCOUNTER — Telehealth: Payer: Self-pay | Admitting: Pharmacist

## 2017-07-16 NOTE — Patient Outreach (Signed)
[facility]) Care Management  07/16/2017  [name] 07-11-1941 [id number]   76 y.o. year old male referred to [facility] for Medication Assistance [facility] telephone f/u) and Medication Adherence   Was unable to reach patient via telephone today and have left HIPAA compliant voicemail asking patient to return my call (unsuccessful outreach #3).  Plan: Will send 10 day unsuccessful outreach letter and allow patient 10 days to respond prior to closing case.    Carlean Jews, Pharm.D., BCPS PGY2 Ambulatory Care Pharmacy Resident Phone: [phone]

## 2017-07-23 ENCOUNTER — Other Ambulatory Visit: Payer: Self-pay

## 2017-07-23 NOTE — Patient Outreach (Signed)
Reassigned referral:  Patient reassigned to me from [place].  Reviewed electronic medical record.    Placed call to patient with no answer.   On 12/6 [place] attempted unsuccessful outreach.   PLAN: will attempt again. If no answer will mail outreach letter. Tomasa Rand, RN, BSN, CEN [facility] [employer] [phone]

## 2017-07-26 ENCOUNTER — Ambulatory Visit: Payer: Medicare Other

## 2017-07-30 ENCOUNTER — Other Ambulatory Visit: Payer: Self-pay

## 2017-07-30 NOTE — Patient Outreach (Signed)
Telephone assessment:  Placed call to patient with no answer. Left a message requesting a call back.  This is the second unsuccessful outreach for me. 1 previous unsuccessful outreach by [employer].  Many unsuccessful outreach from [place].  PLAN: Will Corporate treasurer. If no response will close in 10 days.  Tomasa Rand, RN, BSN, CEN [facility] [employer] [phone]

## 2017-08-04 ENCOUNTER — Telehealth: Payer: Self-pay | Admitting: Pharmacist

## 2017-08-04 NOTE — Patient Outreach (Signed)
[facility]) Care Management  08/04/2017  [name] [birth year] [id number]   77 y.o. year old male referred to [place] for Case Closure ([place])   Patient has not responded to 10day unsuccessful outreach letter sent 07/16/2017.   Plan: Will close pharmacy case  Carlean Jews, Pharm.D., BCPS PGY2 Ambulatory Care Pharmacy Resident Phone: [phone]

## 2017-08-06 ENCOUNTER — Ambulatory Visit: Payer: Medicare Other | Admitting: Cardiology

## 2017-08-09 ENCOUNTER — Other Ambulatory Visit: Payer: Self-pay

## 2017-08-09 NOTE — Patient Outreach (Signed)
Case closure: No response to outreach efforts or letter.   PLAN: will close case.  Tomasa Rand, RN, BSN, CEN [facility] [employer] [phone]

## 2017-08-11 ENCOUNTER — Encounter: Payer: Self-pay | Admitting: Cardiology

## 2017-08-24 ENCOUNTER — Telehealth (HOSPITAL_COMMUNITY): Payer: Self-pay

## 2017-08-24 NOTE — Telephone Encounter (Signed)
Called to schedule f/u cta head/neck, left message for pt to return call. AW 

## 2017-08-31 ENCOUNTER — Ambulatory Visit: Payer: Medicare Other | Admitting: Internal Medicine

## 2017-08-31 DIAGNOSIS — H2513 Age-related nuclear cataract, bilateral: Secondary | ICD-10-CM | POA: Diagnosis not present

## 2017-08-31 DIAGNOSIS — H40009 Preglaucoma, unspecified, unspecified eye: Secondary | ICD-10-CM | POA: Diagnosis not present
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
## 2017-09-09 ENCOUNTER — Ambulatory Visit (INDEPENDENT_AMBULATORY_CARE_PROVIDER_SITE_OTHER): Payer: Medicare Other | Admitting: *Deleted

## 2017-09-09 VITALS — BP 162/88 | HR 65 | Resp 18 | Ht 67.0 in | Wt 166.0 lb

## 2017-09-09 DIAGNOSIS — Z0000 Encounter for general adult medical examination without abnormal findings: Secondary | ICD-10-CM

## 2017-09-09 NOTE — Patient Instructions (Addendum)
Continue to eat heart healthy diet (full of fruits, vegetables, whole grains, lean protein, water--limit salt, fat, and sugar intake) and increase physical activity as tolerated.  Continue doing brain stimulating activities (puzzles, reading, adult coloring books, staying active) to keep memory sharp.    [name] , Thank you for taking time to come for your Medicare Wellness Visit. I appreciate your ongoing commitment to your health goals. Please review the following plan we discussed and let me know if I can assist you in the future.   These are the goals we discussed: Goals    . Patient Stated     Increase my activity by doing chair exercises, increase how much water I drink daily. Maintian my independence as long as possible.       This is a list of the screening recommended for you and due dates:  Health Maintenance  Topic Date Due  . Tetanus Vaccine  01/08/1960  . Colon Cancer Screening  02/17/2017  . Flu Shot  11/01/2017*  . Pneumonia vaccines  Completed  *Topic was postponed. The date shown is not the original due date.       Fall Prevention in the Home Falls can cause injuries. They can happen to people of all ages. There are many things you can do to make your home safe and to help prevent falls. What can I do on the outside of my home?  Regularly fix the edges of walkways and driveways and fix any cracks.  Remove anything that might make you trip as you walk through a door, such as a raised step or threshold.  Trim any bushes or trees on the path to your home.  Use bright outdoor lighting.  Clear any walking paths of anything that might make someone trip, such as rocks or tools.  Regularly check to see if handrails are loose or broken. Make sure that both sides of any steps have handrails.  Any raised decks and porches should have guardrails on the edges.  Have any leaves, snow, or ice cleared regularly.  Use sand or salt on walking paths during  winter.  Clean up any spills in your garage right away. This includes oil or grease spills. What can I do in the bathroom?  Use night lights.  Install grab bars by the toilet and in the tub and shower. Do not use towel bars as grab bars.  Use non-skid mats or decals in the tub or shower.  If you need to sit down in the shower, use a plastic, non-slip stool.  Keep the floor dry. Clean up any water that spills on the floor as soon as it happens.  Remove soap buildup in the tub or shower regularly.  Attach bath mats securely with double-sided non-slip rug tape.  Do not have throw rugs and other things on the floor that can make you trip. What can I do in the bedroom?  Use night lights.  Make sure that you have a light by your bed that is easy to reach.  Do not use any sheets or blankets that are too big for your bed. They should not hang down onto the floor.  Have a firm chair that has side arms. You can use this for support while you get dressed.  Do not have throw rugs and other things on the floor that can make you trip. What can I do in the kitchen?  Clean up any spills right away.  Avoid walking on wet floors.  Keep items that you use a lot in easy-to-reach places.  If you need to reach something above you, use a strong step stool that has a grab bar.  Keep electrical cords out of the way.  Do not use floor polish or wax that makes floors slippery. If you must use wax, use non-skid floor wax.  Do not have throw rugs and other things on the floor that can make you trip. What can I do with my stairs?  Do not leave any items on the stairs.  Make sure that there are handrails on both sides of the stairs and use them. Fix handrails that are broken or loose. Make sure that handrails are as long as the stairways.  Check any carpeting to make sure that it is firmly attached to the stairs. Fix any carpet that is loose or worn.  Avoid having throw rugs at the top or  bottom of the stairs. If you do have throw rugs, attach them to the floor with carpet tape.  Make sure that you have a light switch at the top of the stairs and the bottom of the stairs. If you do not have them, ask someone to add them for you. What else can I do to help prevent falls?  Wear shoes that: ? Do not have high heels. ? Have rubber bottoms. ? Are comfortable and fit you well. ? Are closed at the toe. Do not wear sandals.  If you use a stepladder: ? Make sure that it is fully opened. Do not climb a closed stepladder. ? Make sure that both sides of the stepladder are locked into place. ? Ask someone to hold it for you, if possible.  Clearly mark and make sure that you can see: ? Any grab bars or handrails. ? First and last steps. ? Where the edge of each step is.  Use tools that help you move around (mobility aids) if they are needed. These include: ? Canes. ? Walkers. ? Scooters. ? Crutches.  Turn on the lights when you go into a dark area. Replace any light bulbs as soon as they burn out.  Set up your furniture so you have a clear path. Avoid moving your furniture around.  If any of your floors are uneven, fix them.  If there are any pets around you, be aware of where they are.  Review your medicines with your doctor. Some medicines can make you feel dizzy. This can increase your chance of falling. Ask your doctor what other things that you can do to help prevent falls. This information is not intended to replace advice given to you by your health care provider. Make sure you discuss any questions you have with your health care provider. Document Released: 05/16/2009 Document Revised: 12/26/2015 Document Reviewed: 08/24/2014 Elsevier Interactive Patient Education  2018 [place] Maintenance, Male A healthy lifestyle and preventive care is important for your health and wellness. Ask your health care provider about what schedule of regular  examinations is right for you. What should I know about weight and diet? Eat a Healthy Diet  Eat plenty of vegetables, fruits, whole grains, low-fat dairy products, and lean protein.  Do not eat a lot of foods high in solid fats, added sugars, or salt.  Maintain a Healthy Weight Regular exercise can help you achieve or maintain a healthy weight. You should:  Do at least 150 minutes of exercise each week. The exercise should increase your heart rate and  make you sweat (moderate-intensity exercise).  Do strength-training exercises at least twice a week.  Watch Your Levels of Cholesterol and Blood Lipids  Have your blood tested for lipids and cholesterol every 5 years starting at 76 years of age. If you are at high risk for heart disease, you should start having your blood tested when you are 76 years old. You may need to have your cholesterol levels checked more often if: ? Your lipid or cholesterol levels are high. ? You are older than 76 years of age. ? You are at high risk for heart disease.  What should I know about cancer screening? Many types of cancers can be detected early and may often be prevented. Lung Cancer  You should be screened every year for lung cancer if: ? You are a current smoker who has smoked for at least 30 years. ? You are a former smoker who has quit within the past 15 years.  Talk to your health care provider about your screening options, when you should start screening, and how often you should be screened.  Colorectal Cancer  Routine colorectal cancer screening usually begins at 76 years of age and should be repeated every 5-10 years until you are 76 years old. You may need to be screened more often if early forms of precancerous polyps or small growths are found. Your health care provider may recommend screening at an earlier age if you have risk factors for colon cancer.  Your health care provider may recommend using home test kits to check for hidden  blood in the stool.  A small camera at the end of a tube can be used to examine your colon (sigmoidoscopy or colonoscopy). This checks for the earliest forms of colorectal cancer.  Prostate and Testicular Cancer  Depending on your age and overall health, your health care provider may do certain tests to screen for prostate and testicular cancer.  Talk to your health care provider about any symptoms or concerns you have about testicular or prostate cancer.  Skin Cancer  Check your skin from head to toe regularly.  Tell your health care provider about any new moles or changes in moles, especially if: ? There is a change in a mole's size, shape, or color. ? You have a mole that is larger than a pencil eraser.  Always use sunscreen. Apply sunscreen liberally and repeat throughout the day.  Protect yourself by wearing long sleeves, pants, a wide-brimmed hat, and sunglasses when outside.  What should I know about heart disease, diabetes, and high blood pressure?  If you are 55-28 years of age, have your blood pressure checked every 3-5 years. If you are 33 years of age or older, have your blood pressure checked every year. You should have your blood pressure measured twice-once when you are at a hospital or clinic, and once when you are not at a hospital or clinic. Record the average of the two measurements. To check your blood pressure when you are not at a hospital or clinic, you can use: ? An automated blood pressure machine at a pharmacy. ? A home blood pressure monitor.  Talk to your health care provider about your target blood pressure.  If you are between 75-19 years old, ask your health care provider if you should take aspirin to prevent heart disease.  Have regular diabetes screenings by checking your fasting blood sugar level. ? If you are at a normal weight and have a low risk for diabetes, have  this test once every three years after the age of 26. ? If you are overweight and  have a high risk for diabetes, consider being tested at a younger age or more often.  A one-time screening for abdominal aortic aneurysm (AAA) by ultrasound is recommended for men aged 35-75 years who are current or former smokers. What should I know about preventing infection? Hepatitis B If you have a higher risk for hepatitis B, you should be screened for this virus. Talk with your health care provider to find out if you are at risk for hepatitis B infection. Hepatitis C Blood testing is recommended for:  Everyone born from [birth year] through 1964.  Anyone with known risk factors for hepatitis C.  Sexually Transmitted Diseases (STDs)  You should be screened each year for STDs including gonorrhea and chlamydia if: ? You are sexually active and are younger than 76 years of age. ? You are older than 76 years of age and your health care provider tells you that you are at risk for this type of infection. ? Your sexual activity has changed since you were last screened and you are at an increased risk for chlamydia or gonorrhea. Ask your health care provider if you are at risk.  Talk with your health care provider about whether you are at high risk of being infected with HIV. Your health care provider may recommend a prescription medicine to help prevent HIV infection.  What else can I do?  Schedule regular health, dental, and eye exams.  Stay current with your vaccines (immunizations).  Do not use any tobacco products, such as cigarettes, chewing tobacco, and e-cigarettes. If you need help quitting, ask your health care provider.  Limit alcohol intake to no more than 2 drinks per day. One drink equals 12 ounces of beer, 5 ounces of Henreitta Spittler, or 1 ounces of hard liquor.  Do not use street drugs.  Do not share needles.  Ask your health care provider for help if you need support or information about quitting drugs.  Tell your health care provider if you often feel depressed.  Tell your  health care provider if you have ever been abused or do not feel safe at home. This information is not intended to replace advice given to you by your health care provider. Make sure you discuss any questions you have with your health care provider. Document Released: 01/16/2008 Document Revised: 03/18/2016 Document Reviewed: 04/23/2015 Elsevier Interactive Patient Education  [employer].

## 2017-09-15 ENCOUNTER — Telehealth: Payer: Self-pay | Admitting: *Deleted

## 2017-09-15 NOTE — Telephone Encounter (Signed)
Called and LVM stating this nurse and [place] would like to visit with patient after his appointment with PCP tomorrow to discuss health needs and social resource needs the patient may have.

## 2017-09-16 ENCOUNTER — Encounter: Payer: Self-pay | Admitting: Internal Medicine

## 2017-09-16 ENCOUNTER — Ambulatory Visit (INDEPENDENT_AMBULATORY_CARE_PROVIDER_SITE_OTHER): Payer: Medicare Other | Admitting: Internal Medicine

## 2017-09-16 ENCOUNTER — Telehealth: Payer: Self-pay | Admitting: *Deleted

## 2017-09-16 ENCOUNTER — Other Ambulatory Visit: Payer: Self-pay | Admitting: *Deleted

## 2017-09-16 ENCOUNTER — Other Ambulatory Visit (INDEPENDENT_AMBULATORY_CARE_PROVIDER_SITE_OTHER): Payer: Medicare Other

## 2017-09-16 VITALS — BP 122/84 | HR 81 | Temp 98.0°F | Ht 67.0 in | Wt 156.0 lb

## 2017-09-16 DIAGNOSIS — G2581 Restless legs syndrome: Secondary | ICD-10-CM

## 2017-09-16 DIAGNOSIS — E559 Vitamin D deficiency, unspecified: Secondary | ICD-10-CM | POA: Diagnosis not present

## 2017-09-16 DIAGNOSIS — E611 Iron deficiency: Secondary | ICD-10-CM

## 2017-09-16 DIAGNOSIS — R350 Frequency of micturition: Secondary | ICD-10-CM

## 2017-09-16 DIAGNOSIS — N320 Bladder-neck obstruction: Secondary | ICD-10-CM

## 2017-09-16 DIAGNOSIS — R7303 Prediabetes: Secondary | ICD-10-CM

## 2017-09-16 DIAGNOSIS — I10 Essential (primary) hypertension: Secondary | ICD-10-CM | POA: Diagnosis not present

## 2017-09-16 DIAGNOSIS — R079 Chest pain, unspecified: Secondary | ICD-10-CM

## 2017-09-16 DIAGNOSIS — I5042 Chronic combined systolic (congestive) and diastolic (congestive) heart failure: Secondary | ICD-10-CM

## 2017-09-16 DIAGNOSIS — N390 Urinary tract infection, site not specified: Secondary | ICD-10-CM

## 2017-09-16 DIAGNOSIS — E538 Deficiency of other specified B group vitamins: Secondary | ICD-10-CM

## 2017-09-16 DIAGNOSIS — G8194 Hemiplegia, unspecified affecting left nondominant side: Secondary | ICD-10-CM

## 2017-09-16 LAB — CBC WITH DIFFERENTIAL/PLATELET
BASOS ABS: 0 10*3/uL (ref 0.0–0.1)
Basophils Relative: 0.5 % (ref 0.0–3.0)
EOS PCT: 0.2 % (ref 0.0–5.0)
Eosinophils Absolute: 0 10*3/uL (ref 0.0–0.7)
HEMATOCRIT: 37.9 % — AB (ref 39.0–52.0)
HEMOGLOBIN: 12 g/dL — AB (ref 13.0–17.0)
LYMPHS PCT: 36.6 % (ref 12.0–46.0)
Lymphs Abs: 0.9 10*3/uL (ref 0.7–4.0)
MCHC: 31.7 g/dL (ref 30.0–36.0)
MONOS PCT: 15.5 % — AB (ref 3.0–12.0)
Monocytes Absolute: 0.4 10*3/uL (ref 0.1–1.0)
NEUTROS PCT: 47.2 % (ref 43.0–77.0)
Neutro Abs: 1.2 10*3/uL — ABNORMAL LOW (ref 1.4–7.7)
Platelets: 114 10*3/uL — ABNORMAL LOW (ref 150.0–400.0)
RBC: 5.48 Mil/uL (ref 4.22–5.81)
RDW: 17 % — ABNORMAL HIGH (ref 11.5–15.5)
WBC: 2.5 10*3/uL — AB (ref 4.0–10.5)

## 2017-09-16 LAB — HEMOGLOBIN A1C: Hgb A1c MFr Bld: 6.8 % — ABNORMAL HIGH (ref 4.6–6.5)

## 2017-09-16 LAB — PSA: PSA: 1.43 ng/mL (ref 0.10–4.00)

## 2017-09-16 LAB — BRAIN NATRIURETIC PEPTIDE: Pro B Natriuretic peptide (BNP): 182 pg/mL — ABNORMAL HIGH (ref 0.0–100.0)

## 2017-09-16 LAB — TSH: TSH: 1.55 u[IU]/mL (ref 0.35–4.50)

## 2017-09-16 LAB — VITAMIN B12: Vitamin B-12: 632 pg/mL (ref 211–911)

## 2017-09-16 MED ORDER — CEFTRIAXONE SODIUM 1 G IJ SOLR
1.0000 g | Freq: Once | INTRAMUSCULAR | Status: AC
Start: 2017-09-16 — End: 2017-09-16
  Administered 2017-09-16: 1 g via INTRAMUSCULAR

## 2017-09-16 MED ORDER — GABAPENTIN 300 MG PO CAPS: 300.0000 mg | ORAL_CAPSULE | Freq: Every day | ORAL | 1 refills | Status: DC

## 2017-09-16 MED ORDER — CIPROFLOXACIN HCL 500 MG PO TABS: 500.0000 mg | ORAL_TABLET | Freq: Two times a day (BID) | ORAL | 0 refills | Status: AC

## 2017-09-16 NOTE — Patient Outreach (Signed)
[facility]) Care Management  09/16/2017  NIRAJ KUDRNA 01/11/1941 [id number]  I had the privilege of meeting with Mr. Kilpatrick in person today at his PCP's office [facility]) at the request of [place], RN.   Medication Management Concerns - Mr. Critzer reported having needs around medication management today. He says he gets some of his medications via mail order and some at [facility]. He does not have a vehicle and says it would be easier for him if all his medications were delivered to him. According to Mrs. Wise, Mr. Delman has difficulty managing his medications. She helps him with his pill boxes but he does not see her regularly in the office. Today, Mr. Salvia brought in his pill bottles in a plastic grocery bag. He agrees to outreach by a [facility] pharmacist and says he would be grateful for a home visit.   Chronic Disease Management Needs - Mr. Hagerman would benefit from education around his chronic diseases including Hypertension, Dyslipidemia, and Afib. Mr. Warga agrees to intervention up to and including a home visit from a [place].   Transportation - Mr. Guedes does not have a car or reliable transportation. He takes the bus typically to his doctor appointments and sometimes his friend [name] can bring him. [name] was unable to bring him today but he was fortunate enough to run into an old friend who brought him to the office. He relates that he can take the bus but finds it tiring and difficult at times as he walks with a cane and ambulates slowly.   I explained [facility] services to Mr. Wolak today and he wholeheartedly agreed to services. He says he looks forward to hearing from our team.   Plan: Referrals made for nursing and pharmacy services.    [facility]  [phone] '

## 2017-09-16 NOTE — Telephone Encounter (Signed)
Called and spoke to patient who states he will be available to meet with nurse after his PCP visit today.

## 2017-09-16 NOTE — Patient Instructions (Addendum)
You had the antibiotic shot today (rocephin)  Please take all new medication as prescribed - the pill antibiotics  Please continue all other medications as before, and refills have been done if requested.  Please have the pharmacy call with any other refills you may need.  Please continue your efforts at being more active, low cholesterol diet, and weight control.  You are otherwise up to date with prevention measures today.  Please keep your appointments with your specialists as you may have planned  Please go to the LAB in the Basement (turn left off the elevator) for the tests to be done today  You will be contacted by phone if any changes need to be made immediately.  Otherwise, you will receive a letter about your results with an explanation, but please check with MyChart first.  Please remember to sign up for MyChart if you have not done so, as this will be important to you in the future with finding out test results, communicating by private email, and scheduling acute appointments online when needed.  Sharee Pimple with the Office will also see you today  Please return in 4 months, or sooner if needed

## 2017-09-16 NOTE — Progress Notes (Signed)
Subjective:    Patient ID: [name], male    DOB: 1941-07-09, 77 y.o.   MRN: [id number]  HPI   Here to f/u with multiple complaints including recent new onset urinary frequency with dizzienss and weakness,  intermittent loose stools, non prod cough with sharp CP and HA with cough; c/o RLS symptoms and cannot get to sleep at night, as well as low back pain with ? mild recent worsening LLE weakness, and scalp itchiness. Also c/o sharp pleuritic CPx 2-3 days with scant prod cough, seems to radiate to epigastric and lingers for a miniute, better with coughing, no radiation or Craig assoc symptoms.  No falls or injury. No Craig interval hx or complaints  [place] has low funds, may not be able to afford meds until feb 25 Past Medical History:  Diagnosis Date  . Anemia, iron deficiency 08/15/2011  . CAD (coronary artery disease)    cath in 1/08: EF 60%, mild dilated Ao root, oD2 30%, LAD 20-30%.  . Dilated aortic root ([place]) 08/15/2011  . Dyslipidemia   . Dysrhythmia   . History of MI (myocardial infarction) 08/15/2011  . History of pneumonia 08/15/2011  . History of stroke   . Hyperlipidemia   . Hypertension    echocardiogram 6/10: Moderate LVH, EF 55-65%, mild, mild MR, MAC, mild LAE, PASP 32  . Myocardial infarction ([place])   . Paroxysmal atrial fibrillation ([facility])   . Prediabetes 10/08/2015  . Preoperative cardiovascular examination 10/09/2015  . Shortness of breath dyspnea   . Stroke ([place]) 10/2011  . 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 CARDIAC CATHETERIZATION    . ENDARTERECTOMY Right 10/10/2015   Procedure: ENDARTERECTOMY CAROTID-RIGHT;  Surgeon: Serafina Mitchell, MD;  [facility];  Service: Vascular;  Laterality: Right;  . IR RADIOLOGIST EVAL & MGMT  02/11/2017  . IR RADIOLOGIST EVAL & MGMT  03/24/2017    reports that  has never smoked. he has never used smokeless tobacco. He reports that he does not drink alcohol or  use drugs. family history includes Aneurysm in his sister. No Known Allergies Current Outpatient Medications on File Prior to Visit  Medication Sig Dispense Refill  . apixaban (ELIQUIS) 5 MG TABS tablet Take 1 tablet (5 mg total) by mouth 2 (two) times daily. 60 tablet 2  . aspirin EC 81 MG tablet Take 1 tablet (81 mg total) by mouth daily. 30 tablet 0  . atorvastatin (LIPITOR) 80 MG tablet Take 1 tablet (80 mg total) by mouth daily. 90 tablet 3  . furosemide (LASIX) 40 MG tablet Take 1 tablet (40 mg total) by mouth daily. 90 tablet 3  . isosorbide mononitrate (IMDUR) 60 MG 24 hr tablet Take 1 tablet (60 mg total) by mouth daily. 90 tablet 3  . lisinopril (PRINIVIL,ZESTRIL) 2.5 MG tablet Take 1 tablet (2.5 mg total) by mouth daily. 90 tablet 3  . potassium chloride SA (K-DUR,KLOR-CON) 20 MEQ tablet Take 2 tablets (40 mEq total) by mouth daily. 90 tablet 3  . PROAIR RESPICLICK 557 (90 Base) MCG/ACT AEPB Inhale 2 puffs into the lungs every 6 (six) hours as needed (for wheezing or shortness of breath).     . senna-docusate (SENOKOT-S) 8.6-50 MG tablet Take 1 tablet by mouth at bedtime as needed for mild constipation. 15 tablet 0  . tamsulosin (FLOMAX) 0.4 MG CAPS capsule Take 1 capsule (0.4 mg total) by mouth daily after supper. 90 capsule 3  .  carvedilol (COREG) 25 MG tablet Take 1 tablet (25 mg total) by mouth 2 (two) times daily. 180 tablet 3   No current facility-administered medications on file prior to visit.    Review of Systems  Constitutional: Negative for Craig unusual diaphoresis or sweats HENT: Negative for ear discharge or swelling Eyes: Negative for Craig worsening visual disturbances Respiratory: Negative for stridor or Craig swelling  Gastrointestinal: Negative for worsening distension or Craig blood Genitourinary: Negative for retention or Craig urinary change Musculoskeletal: Negative for Craig MSK pain or swelling Skin: Negative for color change or Craig new  lesions Neurological: Negative for worsening tremors and Craig numbness  Psychiatric/Behavioral: Negative for worsening agitation or Craig fatigue All Craig system neg per pt    Objective:   Physical Exam BP 122/84   Pulse 81   Temp 98 F (36.7 C) (Oral)   Ht _0  (1.702 m)   Wt 156 lb (70.8 kg)   SpO2 97%   BMI 24.43 kg/m  VS noted, mild ill Constitutional: Pt appears in NAD HENT: Head: NCAT.  Right Ear: External ear normal.  Left Ear: External ear normal.  Eyes: . Pupils are equal, round, and reactive to light. Conjunctivae and EOM are normal Nose: without d/c or deformity Neck: Neck supple. Gross normal ROM Cardiovascular: Normal rate and regular rhythm.   Pulmonary/Chest: Effort normal and breath sounds without rales or wheezing.  Abd:  Soft, with loow mid abd tender without guarding or rebund, ND, + BS, no organomegaly Neurological: Pt is alert. At baseline orientation, motor intact except for LLE 4+/5  Skin: Skin is warm. No rashes, Craig new lesions, no LE edema Psychiatric: Pt behavior is normal without agitation  No Craig exam findings    Assessment & Plan:

## 2017-09-17 ENCOUNTER — Other Ambulatory Visit: Payer: Self-pay | Admitting: Internal Medicine

## 2017-09-17 ENCOUNTER — Encounter: Payer: Self-pay | Admitting: Internal Medicine

## 2017-09-17 ENCOUNTER — Other Ambulatory Visit: Payer: Self-pay

## 2017-09-17 DIAGNOSIS — D509 Iron deficiency anemia, unspecified: Secondary | ICD-10-CM

## 2017-09-17 LAB — LIPID PANEL
Cholesterol: 156 mg/dL (ref 0–200)
HDL: 29.4 mg/dL — ABNORMAL LOW (ref >39.00)
LDL Cholesterol: 103 mg/dL — ABNORMAL HIGH (ref 0–99)
NONHDL: 126.2
TRIGLYCERIDES: 117 mg/dL (ref 0.0–149.0)
Total CHOL/HDL Ratio: 5
VLDL: 23.4 mg/dL (ref 0.0–40.0)

## 2017-09-17 LAB — BASIC METABOLIC PANEL
BUN: 45 mg/dL — ABNORMAL HIGH (ref 6–23)
CALCIUM: 8.3 mg/dL — AB (ref 8.4–10.5)
CO2: 21 mEq/L (ref 19–32)
CREATININE: 1.58 mg/dL — AB (ref 0.40–1.50)
Chloride: 102 mEq/L (ref 96–112)
GFR: 55.16 mL/min — AB (ref >60.00)
Glucose, Bld: 91 mg/dL (ref 70–99)
Potassium: 4.6 mEq/L (ref 3.5–5.1)
SODIUM: 137 meq/L (ref 135–145)

## 2017-09-17 LAB — HEPATIC FUNCTION PANEL
ALBUMIN: 3.8 g/dL (ref 3.5–5.2)
ALK PHOS: 52 U/L (ref 39–117)
ALT: 16 U/L (ref 0–53)
AST: 30 U/L (ref 0–37)
Bilirubin, Direct: 0.1 mg/dL (ref 0.0–0.3)
Total Bilirubin: 0.4 mg/dL (ref 0.2–1.2)
Total Protein: 7.7 g/dL (ref 6.0–8.3)

## 2017-09-17 LAB — IBC PANEL
Iron: 21 ug/dL — ABNORMAL LOW (ref 42–165)
Saturation Ratios: 6.1 % — ABNORMAL LOW (ref 20.0–50.0)
TRANSFERRIN: 244 mg/dL (ref 212.0–360.0)

## 2017-09-17 LAB — VITAMIN D 25 HYDROXY (VIT D DEFICIENCY, FRACTURES): VITD: 7.09 ng/mL — ABNORMAL LOW (ref 30.00–100.00)

## 2017-09-17 MED ORDER — VITAMIN D3 1.25 MG (50000 UT) PO TABS: 1.0000 | ORAL_TABLET | ORAL | 0 refills | Status: DC

## 2017-09-17 MED ORDER — POLYSACCHARIDE IRON COMPLEX 150 MG PO CAPS: 150.0000 mg | ORAL_CAPSULE | Freq: Every day | ORAL | 5 refills | Status: DC

## 2017-09-17 NOTE — Patient Outreach (Signed)
Late Entry from 09/09/2017:  Patient called after seeing his primary MD and stated that he missed outreach calls and letter. This patient was reassigned from another case managers case load.  Patient reports to me that he is doing well. Reports that he has all his medications and is taking them as prescribed. Reports his BP is better controlled at this time.  Declined any needs at this time. I reviewed with patient that the [facility] pharmacist Glynis Smiles has also reached out to him unsuccessfully.  I provided the pharmacist contact name and phone number and encouraged patient to call for any future needs. Patient agreed.   PLAN: will leave patient closed as of 09/09/2017 as patient declined needs.  Tomasa Rand, RN, BSN, CEN [facility] [employer] [phone]

## 2017-09-17 NOTE — Progress Notes (Signed)
This encounter was created in error - please disregard.

## 2017-09-17 NOTE — Addendum Note (Signed)
Addended by: Jiles Harold on: 09/17/2017 09:12 AM   Modules accepted: Level of Service, SmartSet

## 2017-09-18 DIAGNOSIS — R079 Chest pain, unspecified: Secondary | ICD-10-CM | POA: Insufficient documentation

## 2017-09-18 NOTE — Assessment & Plan Note (Signed)
Very low suspicion for cardiac, declines ecg, for ER if recurrence or worsening

## 2017-09-18 NOTE — Assessment & Plan Note (Addendum)
High suspicion based on exam, for rocephin IM 1 gm, oral antibx, urine studies,  to f/u any worsening symptoms or concerns  Note:  Total time for pt hx, exam, review of record with pt in the room, determination of diagnoses and plan for further eval and tx is > 40 min, with over 50% spent in coordination and counseling of patient including the differential dx, tx, further evaluation and other management of urinary frequency, CP, diarrhea, RLS, left LE weakness

## 2017-09-18 NOTE — Assessment & Plan Note (Signed)
stable overall by history and exam, recent data reviewed with pt, and pt to continue medical treatment as before,  to f/u any worsening symptoms or concerns BP Readings from Last 3 Encounters:  09/16/17 122/84  09/09/17 (!) 162/88  06/07/17 (!) 171/89

## 2017-09-18 NOTE — Assessment & Plan Note (Signed)
Also for psa as he is due, asympt

## 2017-09-18 NOTE — Assessment & Plan Note (Signed)
Ok for gabapentin 300 qhs,  to f/u any worsening symptoms or concerns

## 2017-09-18 NOTE — Assessment & Plan Note (Signed)
stable overall by history and exam, recent data reviewed with pt, and pt to continue medical treatment as before,  to f/u any worsening symptoms or concerns  

## 2017-09-18 NOTE — Assessment & Plan Note (Signed)
Lab Results  Component Value Date   HGBA1C 6.8 (H) 09/16/2017  stable overall by history and exam, recent data reviewed with pt, and pt to continue medical treatment as before,  to f/u any worsening symptoms or concerns

## 2017-09-18 NOTE — Assessment & Plan Note (Signed)
Overall stable it seems, not clear is worsening, pt declines further imaging evaluation such as ls spine mri

## 2017-09-20 ENCOUNTER — Telehealth: Payer: Self-pay | Admitting: *Deleted

## 2017-09-20 NOTE — Telephone Encounter (Signed)
Called patient and gave him his lab results instructions from Dr. [name]. Mr. Alvizo wrote down the instructions and verbally repeated them back. He separated the furosemide medication and repeated he will not take it again until Friday 2/22. He also wrote down and repeated verbally that he will come and have repeat labs drawn on Friday 2/22. Nurse contracted with the patient that she will call him Friday to remind him to come to have labs drawn and to start furosemide. Patient also wrote down the medications that were prescribed and sent to his pharmacy. Patient states he is unable to afford to get these medications until 2/25, when he receives his check. Nurse explained the importance of these medications and patient wrote them down and verbalized understanding. Nurse will contact [facility] and update them as well.

## 2017-09-20 NOTE — Telephone Encounter (Signed)
Called patient back and he asked if nurse will call him back at 1600, nurse will call him at 1600.

## 2017-09-20 NOTE — Telephone Encounter (Signed)
Called to give patient result orders from PCP. Patient stated that he was not at home and could not speak to nurse. Nurse stated the importance of the results, the patient verbalized understanding and asked the nurse to call him back at 1500 today. Nurse will call patient back at 1500.

## 2017-09-21 ENCOUNTER — Encounter: Payer: Self-pay | Admitting: *Deleted

## 2017-09-21 ENCOUNTER — Other Ambulatory Visit: Payer: Self-pay | Admitting: *Deleted

## 2017-09-21 NOTE — Patient Outreach (Signed)
[name]) Care Management  09/21/2017  [name] [birth year] [id number]   Referral received from [facility] care manager after meeting with member after primary MD office visit, request to provide education and assist member with management of his chronic health conditions.  Per chart, he has history of hypertension, CAD, A-fib, heart failure, chronic kidney disease, and dyslipidemia.  Noted that [facility] has made several attempts in the past couple years to assist member however he has either declined the need for assistance or did not maintain contact (multiple no shows noted).  Call placed to member, identity verified.  This care manager introduced self and purpose of call.  [facility] care management services again explained.  He agrees to services and agrees to home visit within the next 2 weeks.  He is reminded the purpose of [facility] is to help him manage his health care and provide resources needed (such as medication assistance as he has stated he is unable to afford medications).  Also reminded of importance of maintaining contact with this care manager and being available during scheduled time of visit.  He apologizes for missed opportunities, verbalizes understanding.  Advised to contact this care manager in the meantime with concerns.  Will perform assessment and develop personalized care plan during initial home visit.  [name], [place], MSN [place] [phone]

## 2017-09-23 NOTE — Telephone Encounter (Signed)
Called to check on the patient and remind him about restarting furosemide medication and coming to [place] facility for lab draw on Friday. Nurse received a message stating that the number you are trying to reach is not reachable. Nurse will call again tomorrow (Friday).

## 2017-09-24 ENCOUNTER — Ambulatory Visit: Payer: Medicare Other | Admitting: Neurology
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
## 2017-09-24 NOTE — Telephone Encounter (Signed)
Nurse attempted to call patient again to remind him to start furosemide and to come to [place] facility to for labs. Again, nurse received a message stating that the phone you are trying to reach is not reachable.

## 2017-09-27 ENCOUNTER — Encounter: Payer: Self-pay | Admitting: Gastroenterology

## 2017-09-27 ENCOUNTER — Telehealth: Payer: Self-pay | Admitting: Pharmacist

## 2017-09-27 NOTE — Patient Outreach (Signed)
[facility]) Care Management  09/27/2017  [name] 1940-10-06 [id number]   Patient was called regarding medication management. Unfortunately, he did not answer his phone. HIPAA compliant message left on the patient's voicemail.  Plan: Call patient back tomorrow per protocol. A home visit was previously scheduled for 10/07/17.   [name], PharmD, [place] Clinical Pharmacist [phone]

## 2017-09-28 ENCOUNTER — Ambulatory Visit: Payer: Self-pay | Admitting: Pharmacist

## 2017-09-28 ENCOUNTER — Other Ambulatory Visit: Payer: Self-pay | Admitting: Pharmacist

## 2017-09-28 NOTE — Patient Outreach (Signed)
[facility]) Care Management  09/28/2017  [name] January 29, 1941 [id number]   Patient was called regarding medication management. Unfortunately, he did not answer his phone. HIPAA compliant message left on the patient's voicemail.  Plan: Send patient an unsuccessful contact letter per protocol A home visit was previously scheduled for 10/07/17.   [name], PharmD, [place] Clinical Pharmacist [phone]

## 2017-10-04 ENCOUNTER — Other Ambulatory Visit: Payer: Self-pay | Admitting: *Deleted

## 2017-10-04 ENCOUNTER — Other Ambulatory Visit: Payer: Self-pay | Admitting: Pharmacist

## 2017-10-04 ENCOUNTER — Ambulatory Visit: Payer: Self-pay | Admitting: *Deleted

## 2017-10-04 NOTE — Patient Outreach (Signed)
[facility]) Care Management  10/04/2017  JAHEL WAVRA 06-05-1941 [id number]   Call placed to member to confirm home visit for this afternoon.  He report he is not home and would like to reschedule for next week.  [facility] pharmacist, K. Luciana Axe, notified of cancellation.  Will attempt another home visit next week.  [name], [place], MSN [place] [phone]

## 2017-10-04 NOTE — Patient Outreach (Signed)
[facility]) Care Management  10/04/2017  [name] 1940/12/09 [id number]   Patient was called to be sure he would be available for a home visit today. A HIPAA compliant message was left on the patient's voicemail.  Spoke with [place] Nurse, [name] who said she spoke with the patient and he said he was not home today and today was not a good day for a visit.   Plan:  Call patient back in 2-3 business days.  [name], PharmD, [place] Clinical Pharmacist [phone]

## 2017-10-13 ENCOUNTER — Ambulatory Visit: Payer: Self-pay | Admitting: *Deleted

## 2017-10-13 ENCOUNTER — Other Ambulatory Visit: Payer: Self-pay | Admitting: Pharmacist

## 2017-10-13 ENCOUNTER — Other Ambulatory Visit: Payer: Self-pay | Admitting: *Deleted

## 2017-10-13 NOTE — Patient Outreach (Signed)
[facility]) Care Management  10/13/2017  [name] Dec 26, 1940 [id number]   Call placed to member to confirm home visit.  He report he will not be available due to another appointment.  Request to reschedule for later this afternoon, however this care manager is not available.    Call placed to K. Luciana Axe, pharmacist, to collaborate on plan of care and home home visit.  She has scheduled home visit for Monday, this care manager will accompany for joint visit.  Will provide update to primary MD office regarding difficulty with engagement.  [name], [place], MSN [place] [phone]

## 2017-10-13 NOTE — Patient Outreach (Signed)
[facility]) Care Management  10/13/2017  [name] April 15, 1941 [id number]   Called patient to follow up with him and get our home visit rescheduled.  HIPAA identifiers were obtained. Patient's home visit was rescheduled for 10/18/17 at 11:00 am.  [name], PharmD, [place] Clinical Pharmacist [phone]

## 2017-10-14 ENCOUNTER — Ambulatory Visit: Payer: Medicare Other | Admitting: Pharmacist

## 2017-10-18 ENCOUNTER — Ambulatory Visit: Payer: Self-pay | Admitting: Pharmacist

## 2017-10-18 ENCOUNTER — Other Ambulatory Visit: Payer: Self-pay | Admitting: Pharmacist

## 2017-10-18 ENCOUNTER — Other Ambulatory Visit: Payer: Self-pay | Admitting: *Deleted

## 2017-10-18 ENCOUNTER — Encounter: Payer: Self-pay | Admitting: *Deleted

## 2017-10-18 ENCOUNTER — Telehealth: Payer: Self-pay | Admitting: Internal Medicine

## 2017-10-18 ENCOUNTER — Ambulatory Visit: Payer: Self-pay

## 2017-10-18 ENCOUNTER — Ambulatory Visit: Payer: Self-pay | Admitting: *Deleted

## 2017-10-18 ENCOUNTER — Other Ambulatory Visit: Payer: Self-pay

## 2017-10-18 MED ORDER — APIXABAN 5 MG PO TABS: 5.0000 mg | ORAL_TABLET | Freq: Two times a day (BID) | ORAL | 2 refills | Status: DC

## 2017-10-18 MED ORDER — ISOSORBIDE MONONITRATE ER 60 MG PO TB24: 60.0000 mg | ORAL_TABLET | Freq: Every day | ORAL | 3 refills | Status: DC

## 2017-10-18 MED ORDER — LISINOPRIL 2.5 MG PO TABS: 2.5000 mg | ORAL_TABLET | Freq: Every day | ORAL | 3 refills | Status: DC

## 2017-10-18 NOTE — Telephone Encounter (Signed)
[name], RN with [place] case management called in with elevated BP readings.   She is still there at the house.   He had not taken any of his medications this morning.   So she got him to take the Carvedilol now for his elevated BP.   [name] is recommending [facility] for assistance for him.   This is her first visit with him.   There seems to be some repeating of statements and not sure of his ability to understand instructions when I asked her.   They have been trying to get assistance for him  For a while now.   The only help he has is a friend who is out of town right  now.   His kids all live out of town.    The pharmacist was also there this morning going over his medications with him prior to [name]'s visit.    [name], RN is going to review with him how to correctly check his BP.   She is going to check back on him in roughly an hour to see if his BP has come down after taking his medication.    He has Lasix 40 mg there too but the pt said the dr. [name] him not to take it.   He could not say which doctor told him not to take it.   His next appt with Dr. [name] isn't until June 2019.    [name] is going to call back with his follow up BP readings in about an hour.    I routed a note to [name] nurse pool making them aware of the situation.   I let [name], RN know I was making him aware of the situation too.  Reason for Disposition . [7] Systolic BP  >= 209 OR Diastolic >= 470 AND [9] missed most recent dose of blood pressure medication  Answer Assessment - Initial Assessment Questions 1. BLOOD PRESSURE: "What is the blood pressure?" "Did you take at least two measurements 5 minutes apart?"     219/123 on his machine,  210/102 manually by the [name] 2. ONSET: "When did you take your blood pressure?"     Just now 3. HOW: "How did you obtain the blood pressure?" (e.g., visiting nurse, automatic home BP monitor)     His machine then manually by the nurse 4. HISTORY: "Do you have  a history of high blood pressure?"     Yes and stroke 5. MEDICATIONS: "Are you taking any medications for blood pressure?" "Have you missed any doses recently?"     The pharmacist was there earlier.   He has not taken BP medication today but did take it yesterday.  Just has not taken it yet medication.  6. OTHER SYMPTOMS: "Do you have any symptoms?" (e.g., headache, chest pain, blurred vision, difficulty breathing, weakness)     No dizziness, blurred vision, difficulty breathing, weakness. 7. PREGNANCY: "Is there any chance you are pregnant?" "When was your last menstrual period?"     N/A  Protocols used: HIGH BLOOD PRESSURE-A-AH

## 2017-10-18 NOTE — Patient Outreach (Signed)
[name]) Care Management  [place]   10/18/2017  [name] [birth year] [id number]  Subjective: Home visit was completed at patient's home. [facility] Pharmacist, [name] was present for the visit.  Patient is a 77 year old male with multiple medical conditions including but not limited to:  History of stroke, CHF, atrial fibrillation, iron deficiency anemia, CAD, chest pain, hyperlipidemia, history of MI, history of stroke, and restless leg syndrome.  Patient manages his medications on his own. He has a pill box but said he prefers not using it due to vision difficulties.  Patient uses both Educational psychologist and Architectural technologist.   Objective:   Encounter Medications: Outpatient Encounter Medications as of 10/18/2017  Medication Sig Note  . aspirin EC 81 MG tablet Take 1 tablet (81 mg total) by mouth daily.   Marland Kitchen atorvastatin (LIPITOR) 80 MG tablet Take 1 tablet (80 mg total) by mouth daily.   . isosorbide mononitrate (IMDUR) 60 MG 24 hr tablet Take 1 tablet (60 mg total) by mouth daily. 10/18/2017: Ordered today from Mail Order  . lisinopril (PRINIVIL,ZESTRIL) 2.5 MG tablet Take 1 tablet (2.5 mg total) by mouth daily. 10/18/2017: Ordered 10/18/17 from mail order. Had not been taking  . potassium chloride SA (K-DUR,KLOR-CON) 20 MEQ tablet Take 2 tablets (40 mEq total) by mouth daily.   . tamsulosin (FLOMAX) 0.4 MG CAPS capsule Take 1 capsule (0.4 mg total) by mouth daily after supper.   Marland Kitchen apixaban (ELIQUIS) 5 MG TABS tablet Take 1 tablet (5 mg total) by mouth 2 (two) times daily. (Patient not taking: Reported on 10/18/2017) 10/18/2017: Due to cost has not had filled since 09/18  . carvedilol (COREG) 25 MG tablet Take 1 tablet (25 mg total) by mouth 2 (two) times daily.   . Cholecalciferol (VITAMIN D3) 50000 units TABS Take 1 tablet by mouth once a week. (Patient not taking: Reported on 10/18/2017) 10/18/2017: Due to cost never had filled  . furosemide (LASIX) 40  MG tablet Take 1 tablet (40 mg total) by mouth daily. (Patient not taking: Reported on 10/18/2017) 10/18/2017: Patient reported his provider discontinued this medication  . gabapentin (NEURONTIN) 300 MG capsule Take 1 capsule (300 mg total) by mouth at bedtime. (Patient not taking: Reported on 10/18/2017) 10/18/2017: Put on hold due to not picking it up  . iron polysaccharides (NU-IRON) 150 MG capsule Take 1 capsule (150 mg total) by mouth daily. (Patient not taking: Reported on 10/18/2017) 10/18/2017: Due to cost never had filled  . PROAIR RESPICLICK 782 (90 Base) MCG/ACT AEPB Inhale 2 puffs into the lungs every 6 (six) hours as needed (for wheezing or shortness of breath).    . [DISCONTINUED] senna-docusate (SENOKOT-S) 8.6-50 MG tablet Take 1 tablet by mouth at bedtime as needed for mild constipation. (Patient not taking: Reported on 10/18/2017)    No facility-administered encounter medications on file as of 10/18/2017.     Functional Status: In your present state of health, do you have any difficulty performing the following activities: 10/18/2017 09/09/2017  Hearing? Y N  Vision? Y N  Difficulty concentrating or making decisions? N Y  Walking or climbing stairs? Y Y  Dressing or bathing? N N  Doing errands, shopping? Y N  Comment - states his friend takes him to the grocery store  Preparing Food and eating ? Y N  Using the Toilet? N N  In the past six months, have you accidently leaked urine? N Y  Do you  have problems with loss of bowel control? N N  Managing your Medications? Y Y  Managing your Finances? [name]  Housekeeping or managing your Housekeeping? Y Y  Some recent data might be hidden    Fall/Depression Screening: Fall Risk  10/18/2017 09/09/2017 05/03/2017  Falls in the past year? No Yes No  Number falls in past yr: - 1 -  Risk Factor Category  - High Fall Risk -  Risk for fall due to : - - Impaired balance/gait;Impaired mobility;Medication side effect  Follow up - Falls prevention  discussed;Education provided -   [facility] 2/9 Scores 10/18/2017 09/09/2017 05/03/2017 04/16/2017 03/13/2016 03/11/2016 06/26/2014  PHQ - 2 Score 0 0 0 0 0 0 0  PHQ- 9 Score - 3 - - - - -      Assessment:  Patient's medications were reviewed while in his home.   Drugs sorted by system:  Neurologic/Psychologic: Gabapentin-took to the pharmacy but did not pick this up  Cardiovascular: Eliquis (not taking due to cost --since 9/18) Aspirin  Atorvastatin Carvedilol Furosemide (patient said his provider discontinued this) Lisinopril--did not have--reordered from [facility] Isosorbide [place]-did not have-reordered from [facility]  Pulmonary: Proair Respiclick-patient did not have this  Vitamins/Minerals: Niferex (Polysaccharide Iron)--took this to the pharmacy but did not pick this up Potassium Chloride Vitamin D 50,0000 units (took to the pharmacy but was not picked up)  Infectious Diseases: Ciprofloxacin was written at the 09/18/17 office visit but the patient had this filled but did NOT pick it up from the pharmacy.  Miscellaneous: Tamsulosin   Medication Review Findings:  Adherence- the patient's retail pharmacy and mail order pharmacies were called.  Unfortunately, the patient has been noncompliant with several medications:  Eliquis-not filled since 04/2017  Isosorbide Mononitrate & Lisinopril patient did not have bottles of either in his possession, they were ordered for refill from [facility]  Gabapentin, Ciprofloxacin, vitamin D, and Polysaccaride iron complex prescriptions were sent to [facility] (09/18/17) but the patient never picked them up.   Patient did not know the names of all of his medication or what they were for. Patient would be good candidate for pill packing but he needs to be able to get the majority of his medications at no cost from [facility].  Medication Assistance Findings:  Patient has [place] He has 50%  Partial Extra Help His deductible is $85 Eliquis will cost $140 for a 1 month supply (since he has not met his deductible). Since patient has Extra Help, it is unclear if he will be a candidate for Patient Assistance Program.   Plan:  [name] office was called and alerted about the patient's non-compliance and cost issues  Route a note to patient's PCP  Consult with [place] Nurse, [name].  Follow up with the patient in 5-7 business days to help with medication adherence issues.  [name], PharmD, [place] Clinical Pharmacist [phone]

## 2017-10-18 NOTE — Telephone Encounter (Addendum)
Talked with monica/THN---patient is currently out of lisinopril, eloquis and imdur----I [facility] per monica's request---patient has someone to pick meds up---monica is to advise patient to take meds as soon as available, and patient should go to ED if any symtoms worsen or if he is not able to get meds soon---advised monica to call Remer Couse,RN at [place] office if any further questions

## 2017-10-18 NOTE — Telephone Encounter (Signed)
Copied from [place] #[id number]. Topic: General - Other >> Oct 18, 2017  4:15 PM Carolyn Stare wrote:  [place] Bailiff with [facility] call to say pt has several meds that he is not talking or did not pick up from pharmacy on his last visit   [place] phone number [place]  he can not afford , cipro, gabapentin, vitamin D , iron tablet

## 2017-10-18 NOTE — Telephone Encounter (Signed)
[name] with [facility] states pt. Took his Coreg. Last BP 201/121. Her manuel BP 198/100. No symptoms. States he is not taking his Lisinopril or Imdur. Please advise. Can call [name] back with any instructions.[phone].

## 2017-10-18 NOTE — Telephone Encounter (Signed)
See earlier triage note. 

## 2017-10-18 NOTE — Patient Outreach (Addendum)
[facility]) Care Management   10/18/2017  ELDRIGE PITKIN 1941-01-13 [id number]  EFTON THOMLEY is an 77 y.o. male  Subjective:   Member alert and oriented x3, denies complaints of pain or discomfort.    Objective:   Review of Systems  Constitutional: Negative.   HENT: Negative.   Eyes: Negative.   Respiratory: Negative.   Cardiovascular: Negative.   Gastrointestinal: Negative.   Genitourinary: Negative.   Musculoskeletal: Negative.   Skin: Negative.   Neurological: Negative.   Endo/Heme/Allergies: Negative.   Psychiatric/Behavioral: Negative.     Physical Exam  Constitutional: He is oriented to person, place, and time. He appears well-developed and well-nourished.  Neck: Normal range of motion.  Cardiovascular: Normal rate, regular rhythm and normal heart sounds.  Respiratory: Effort normal and breath sounds normal.  GI: Soft. Bowel sounds are normal.  Musculoskeletal: Normal range of motion.  Neurological: He is alert and oriented to person, place, and time.  Skin: Skin is warm and dry.   BP (!) 210/102 (BP Location: Right Arm, Patient Position: Sitting, Cuff Size: Normal)   Pulse 76   Resp 18   SpO2 96%   Encounter Medications:   Outpatient Encounter Medications as of 10/18/2017  Medication Sig  . apixaban (ELIQUIS) 5 MG TABS tablet Take 1 tablet (5 mg total) by mouth 2 (two) times daily.  Marland Kitchen aspirin EC 81 MG tablet Take 1 tablet (81 mg total) by mouth daily.  Marland Kitchen atorvastatin (LIPITOR) 80 MG tablet Take 1 tablet (80 mg total) by mouth daily.  . carvedilol (COREG) 25 MG tablet Take 1 tablet (25 mg total) by mouth 2 (two) times daily.  . Cholecalciferol (VITAMIN D3) 50000 units TABS Take 1 tablet by mouth once a week.  . furosemide (LASIX) 40 MG tablet Take 1 tablet (40 mg total) by mouth daily.  Marland Kitchen gabapentin (NEURONTIN) 300 MG capsule Take 1 capsule (300 mg total) by mouth at bedtime.  . iron polysaccharides (NU-IRON) 150 MG capsule Take 1  capsule (150 mg total) by mouth daily.  . isosorbide mononitrate (IMDUR) 60 MG 24 hr tablet Take 1 tablet (60 mg total) by mouth daily.  Marland Kitchen lisinopril (PRINIVIL,ZESTRIL) 2.5 MG tablet Take 1 tablet (2.5 mg total) by mouth daily.  . potassium chloride SA (K-DUR,KLOR-CON) 20 MEQ tablet Take 2 tablets (40 mEq total) by mouth daily.  Marland Kitchen PROAIR RESPICLICK 876 (90 Base) MCG/ACT AEPB Inhale 2 puffs into the lungs every 6 (six) hours as needed (for wheezing or shortness of breath).   . senna-docusate (SENOKOT-S) 8.6-50 MG tablet Take 1 tablet by mouth at bedtime as needed for mild constipation.  . tamsulosin (FLOMAX) 0.4 MG CAPS capsule Take 1 capsule (0.4 mg total) by mouth daily after supper.   No facility-administered encounter medications on file as of 10/18/2017.     Functional Status:   In your present state of health, do you have any difficulty performing the following activities: 10/18/2017 09/09/2017  Hearing? Y N  Vision? Y N  Difficulty concentrating or making decisions? N Y  Walking or climbing stairs? Y Y  Dressing or bathing? N N  Doing errands, shopping? Y N  Comment - states his friend takes him to the grocery store  Preparing Food and eating ? Y N  Using the Toilet? N N  In the past six months, have you accidently leaked urine? N Y  Do you have problems with loss of bowel control? N N  Managing your Medications? [name]  Managing your Finances? [name]  Housekeeping or managing your Housekeeping? Y Y  Some recent data might be hidden    Fall/Depression Screening:    Fall Risk  10/18/2017 09/09/2017 05/03/2017  Falls in the past year? No Yes No  Number falls in past yr: - 1 -  Risk Factor Category  - High Fall Risk -  Risk for fall due to : - - Impaired balance/gait;Impaired mobility;Medication side effect  Follow up - Falls prevention discussed;Education provided -   [facility] 2/9 Scores 10/18/2017 09/09/2017 05/03/2017 04/16/2017 03/13/2016 03/11/2016 06/26/2014  PHQ - 2 Score 0 0 0 0 0 0 0  PHQ- 9  Score - 3 - - - - -    Assessment:    Met with member at scheduled time.  Abbey Chatters, [facility] pharmacist, completing visit upon arrival.  See note for thorough medication review.    Assessment complete, blood pressure elevated.  Call placed to primary office to report findings (listed below) and request advice.  Right arm:  219/123 80 Left arm: 210/108 62 Manual: 210/102  Advised to have member take Coreg and Furosemide and recheck in 1 hour.  He does not have Lisinopril and Imdur in the home.  He took Coreg, but refused to take Furosemide, stating he was told by Dr. [name] to stop taking.  He denies any symptoms.  Recheck 1 hour after medication, remain asymptomatic:  Right arm: 201/121  Manual: 198/100  Advised by MD office that prescriptions will be called in for Imdur and Lisinopril.  Member advised, state he will have someone take him to obtain.  Advised to contact this care manager, primary MD office, or 911 if he becomes symptomatic, showing any signs of stroke or heart attack.  He verbalizes understanding.  This care manager inquires about member's support in the home as it appears he has been noncompliant with medications as well as had difficulty managing chronic health conditions.  He state his friend, [name], comes over about 2-3 days/week, but he agrees that he may need increased support.  He does not want to consider assisted living at this time, but is agreeable to PACE referral.  Denies any urgent concerns, provided with contact information for this care manager.  Plan:   Will place referral to PACE. Will follow up with member within the next week to confirm he has obtained medications from pharmacy. Will follow up with routine home visit next month.    THN CM Care Plan Problem One     Most Recent Value  Care Plan Problem One  Knowledge deficit related to hypertension as evidenced by uncontrolled blood pressure  Role Documenting the Problem One  Care Management  [place] for Problem One  Active  [facility] Long Term Goal   Member will report blood pressure being at or below goal consistently within the next 31 days  [place] Term Goal Start Date  10/18/17  Interventions for Problem One Long Term Goal  Member educated on dangers of elevated blood pressure, including risk for stroke and heart attack.  Educated on signs/symptoms of both  THN CM Short Term Goal #1   Member will report taking medications as prescribed over the next 4 weeks  THN CM Short Term Goal #1 Start Date  10/18/17  Interventions for Short Term Goal #1  Call placed to primary MD office to discuss elevated blood pressure and medications missing from the home.  Requested prescriptions to be sent to pharmacy for refill.  THN CM Short Term Goal #2   Member will report taking blood pressure daily over the next 4 weeks  THN CM Short Term Goal #2 Start Date  10/18/17  Interventions for Short Term Goal #2  Member educated on correct use of blood pressure monitor.  Advised of importance of daily checks.  Provided with log for readings.     [name], [place], MSN [place] [phone]

## 2017-10-20 NOTE — Telephone Encounter (Signed)
[name] what are your thoughts on this? Is this type of help available for him also?

## 2017-10-20 NOTE — Telephone Encounter (Signed)
You might can check with Jill/AW Coach to see if he qualifies for [facility] help---sometimes they get help from them if they qualify

## 2017-10-20 NOTE — Telephone Encounter (Signed)
Is it anything we can do or refer him for medication payment assistance.

## 2017-10-20 NOTE — Telephone Encounter (Signed)
I sent in eliquis, imdur and lisinopril

## 2017-10-21 NOTE — Addendum Note (Signed)
Addended byValente David on: 10/21/2017 09:04 AM   Modules accepted: Orders

## 2017-10-22 ENCOUNTER — Encounter: Payer: Self-pay | Admitting: *Deleted

## 2017-10-22 ENCOUNTER — Other Ambulatory Visit: Payer: Self-pay | Admitting: *Deleted

## 2017-10-22 NOTE — Patient Outreach (Signed)
[facility]) Care Management  10/22/2017  THI SISEMORE [birth year] [id number]   CSW made an initial attempt to try and contact patient today to perform phone assessment, as well as assess and assist with social needs and services, without success.  A HIPAA compliant message was left for patient on voicemail.  CSW is currently awaiting a return call. CSW will make a second outreach attempt on Monday, October 25, 2017, if CSW does not receive a return call from patient in the meantime. [name], BSW, MSW, LCSW  Licensed Education officer, environmental Health System  Mailing [place] [address] [place], [place] [address] Physical Address-[address], [place], [place] [address] Toll Free Main # [phone] Fax # [phone] Cell # [phone]  Office # [phone] [email or web address]

## 2017-10-22 NOTE — Patient Outreach (Addendum)
[facility]) Care Management  10/22/2017  [name] Mar 29, 1941 [id number]   CSW received a return call from patient today in regards to the HIPAA compliant message that was left on patient's voicemail by CSW earlier in the day.  CSW was able to perform the initial phone assessment, as well as assess and assist with social work needs and services.  CSW introduced self, explained role and types of services provided through [place] [facility] ([place] [facility]).  CSW further explained to patient that CSW works with patient's RNCM, also with [place] Management, [name]. CSW then explained the reason for the call, indicating that [name] thought that patient would benefit from social work services and resources to assist with arranging for in-home care services.  CSW obtained two HIPAA compliant identifiers from patient, which included patient's name and date of birth. Patient reported that he would benefit from in-home care services and is agreeable to having CSW assist him with this process.  CSW agreed to meet with patient to perform an initial home visit to assess needs, as well as assist with completion of applications for [employer] Youth worker) through [facility], as well as CAPS Forensic scientist) through the [place].  Patient reports that he is an active Adult Medicaid recipient, so he would not have to pay for in-home care services out-of-pocket if he is eligible for PCS or CAPS. The initial home visit is scheduled for Wednesday, October 27, 2017 at 11:00 AM.  Once the applications are completed, CSW agreed to submit the applications to [employer] and the [place] for processing.  Patient is aware that his application for PACE (Program of All-Inclusive Care for the Elderly) has already been submitted and is currently  being processed for eligibility. CSW will also speak with patient about free transportation services through [employer].  Patient was unaware that he is entitled to 55 free one-way rides per fiscal year.  CSW explained the process for utilizing the service, but will review more in-depth and possibly schedule transportation arrangements while in patient's home to ensure understanding of proper use.  Patient has been given the name and contact information for [employer] and has been encouraged to utilize this services for any upcoming physician appointments. THN CM Care Plan Problem One     Most Recent Value  Care Plan Problem One  Patient is requiring additional support and services in the home.  Role Documenting the Problem One  Clinical Social Worker  Care Plan for Problem One  Active  [facility] CM Short Term Goal #1   Patient will receive assistance with completion of an application for PCS (Personal Care Services) through [facility], within the next week.  THN CM Short Term Goal #1 Start Date  10/22/17  Interventions for Short Term Goal #1  CSW will meet with patient in the home to perform an initial home visit to assist with completion of the application for PCS, as well as submit to [facility] for processing.  THN CM Short Term Goal #2   Patient will receive assistance with completion of an application for CAPS through the [place], within the next week.  THN CM Short Term Goal #2 Start Date  10/22/17  Interventions for Short Term Goal #2  CSW will meet with patient in the  home to perform an initial home visit to assist with completion of the application for CAPS, as well as submit to the [place] for processing.    THN CM Care Plan Problem Two     Most Recent Value  Care Plan Problem Two  Lack of  transportation to and from physician appointments.  Role Documenting the Problem Two  Clinical Social Worker  Care Plan for Problem Two  Active  THN CM Short Term Goal #1   Patient will contact [employer] [place] Transportation services when he needs a ride to and from his physician appointments, within the next month.  THN CM Short Term Goal #1 Start Date  10/22/17  Interventions for Short Term Goal #2   CSW provided patient with the contact information for [employer] and explained proper use.    [name], BSW, MSW, LCSW  Licensed Education officer, environmental Health System  Mailing [place] [address], [place], [place] [address] Physical Address-[address], [place], [place] [address] Toll Free Main # [phone] Fax # [phone] Cell # [phone]  Office # [phone] [email or web address]

## 2017-10-25 ENCOUNTER — Ambulatory Visit: Payer: Self-pay | Admitting: *Deleted

## 2017-10-26 ENCOUNTER — Other Ambulatory Visit: Payer: Self-pay | Admitting: Pharmacist

## 2017-10-26 ENCOUNTER — Ambulatory Visit: Payer: Self-pay | Admitting: Pharmacist

## 2017-10-26 NOTE — Patient Outreach (Signed)
[facility]) Care Management  10/26/2017  [name] August 05, 1940 [id number]   Called patient regarding medication adherence and assistance follow up. Unfortunately, he did not answer the phone. HIPAA compliant message was left on the patient's voice mail.  Also spoke with [facility] Social Worker, Di Kindle Saporito about the patient.  Plan: Call patient back in 1 business day per protocol.    [name], PharmD, [place] Clinical Pharmacist [phone]

## 2017-10-27 ENCOUNTER — Telehealth (HOSPITAL_COMMUNITY): Payer: Self-pay

## 2017-10-27 ENCOUNTER — Other Ambulatory Visit: Payer: Self-pay | Admitting: Pharmacist

## 2017-10-27 ENCOUNTER — Encounter: Payer: Self-pay | Admitting: Pharmacist

## 2017-10-27 ENCOUNTER — Other Ambulatory Visit: Payer: Self-pay | Admitting: *Deleted

## 2017-10-27 NOTE — Patient Outreach (Signed)
[facility]) Care Management  10/27/2017  LAWSEN ARNOTT 1940-08-30 [id number]   Patient was called regarding medication assistance. Unfortunately, his phone did not ring. Instead, a recording played that said:  "the number you are trying to call is not reachable".  This was the second unsuccessful call for the patient.  Plan: Send patient an unsuccessful contact letter. Patient's case will be closed if I do not hear back from him within the next 10 business days.  [name], PharmD, [place] Clinical Pharmacist [phone]

## 2017-10-27 NOTE — Patient Outreach (Signed)
[facility]) Care Management  10/27/2017  JORDEN MINCHEY 05-17-1941 [id number]   CSW was scheduled to meet with patient today to perform an initial home visit; however, patient was not available at the time of CSW's visit.  CSW waited in patient's driveway for 15 minutes, with no response.  CSW then attempted to contact patient by phone but was unsuccessful.  CSW received an automated recording stating, "The number you are trying to call is not reachable".  CSW was unable to leave a HIPAA compliant message.  CSW will make a second outreach attempt on Thursday, October 28, 2017, if a return call is not received from patient in the meantime. [name], BSW, MSW, LCSW  Licensed Education officer, environmental Health System  Mailing [place] [address], [place], [place] [address] Physical Address-[address], [place], [place] [address] Toll Free Main # [phone] Fax # [phone] Cell # [phone]  Office # [phone] [email or web address]

## 2017-10-27 NOTE — Telephone Encounter (Signed)
Called to schedule f/u cta head/neck, no answer. AW

## 2017-10-28 ENCOUNTER — Other Ambulatory Visit: Payer: Self-pay | Admitting: *Deleted

## 2017-10-28 ENCOUNTER — Encounter: Payer: Self-pay | Admitting: *Deleted

## 2017-10-28 NOTE — Patient Outreach (Signed)
[facility]) Care Management  10/28/2017  [name] [birth year] [id number]   CSW made a second attempt to try and contact patient today to perform phone assessment, as well as assess and assist with social work needs and services, without success.  A HIPAA compliant message was left for patient on voicemail.  CSW continues to await a return call.  CSW will mail an outreach letter to patient's home, encouraging patient to contact CSW at their earliest convenience, if patient is interested in receiving social work services through [facility] with [facility].  If CSW does not receive a return call from patient within the next 10 business days, CSW will make one last call attempt before proceeding with case closure.  Required number of phone attempts will have been made and outreach letter mailed.  [name], BSW, MSW, LCSW  Licensed Education officer, environmental Health System  Mailing [place] [address] [place], [place] [address] Physical Address-[address], [place], [place] [address] Toll Free Main # [phone] Fax # [phone] Cell # [phone]  Office # [phone] [name]

## 2017-11-02 ENCOUNTER — Other Ambulatory Visit: Payer: Self-pay | Admitting: *Deleted

## 2017-11-02 NOTE — Patient Outreach (Signed)
[facility]) Care Management  11/02/2017  RAFAN SANDERS 03-30-1941 [id number]   Call placed to member to follow up on elevated blood pressure and to schedule home visit.  Listed number has recording stating number is unreachable.  Unable to leave a message.  Call then placed to friend, Dayna Barker (name on consent).  Unsuccessful, HIPAA compliant voice message left.  Will await call back.  If no call back will collaborate with pharmacist and LCSW regarding case closure as unsuccessful outreach letter was sent on 3/27 & 3/28.  [name], [place], MSN [place] [phone]

## 2017-11-08 ENCOUNTER — Ambulatory Visit: Payer: Self-pay | Admitting: Pharmacist

## 2017-11-08 ENCOUNTER — Other Ambulatory Visit: Payer: Self-pay | Admitting: Pharmacist

## 2017-11-08 NOTE — Patient Outreach (Signed)
[facility]) Care Management  11/08/2017  [name] 1940-10-15 [id number]   Patient was called regarding medication assistance and adherence. Unfortunately, he did not answer the phone. I have been unsuccessful in reaching this patient.  Plan: Close patient's pharmacy case.  A phone call was placed to [facility] nurse, [facility] about case closure. In basket messages will be sent to [place], Product manager and [place] Nurse, [name] about pharmacy case closure.  [name], PharmD, [place] Clinical Pharmacist [phone]

## 2017-11-10 ENCOUNTER — Ambulatory Visit (INDEPENDENT_AMBULATORY_CARE_PROVIDER_SITE_OTHER): Payer: Medicare Other | Admitting: Gastroenterology

## 2017-11-10 ENCOUNTER — Telehealth: Payer: Self-pay

## 2017-11-10 ENCOUNTER — Encounter: Payer: Self-pay | Admitting: Gastroenterology

## 2017-11-10 VITALS — BP 112/68 | HR 76 | Ht 66.5 in | Wt 164.1 lb

## 2017-11-10 DIAGNOSIS — D509 Iron deficiency anemia, unspecified: Secondary | ICD-10-CM

## 2017-11-10 DIAGNOSIS — Z7901 Long term (current) use of anticoagulants: Secondary | ICD-10-CM | POA: Diagnosis not present

## 2017-11-10 MED ORDER — FERROUS SULFATE 325 (65 FE) MG PO TABS: 325.0000 mg | ORAL_TABLET | Freq: Two times a day (BID) | ORAL | 11 refills | Status: DC

## 2017-11-10 MED ORDER — NA SULFATE-K SULFATE-MG SULF 17.5-3.13-1.6 GM/177ML PO SOLN: 1.0000 | Freq: Once | ORAL | 0 refills | Status: AC

## 2017-11-10 NOTE — Telephone Encounter (Signed)
   [name] 08/24/1940 [id number]  Dear Dr. [name]:  We have scheduled the above named patient for a(n) Upper Endoscopy/Colonoscopy procedure. Our records show that (s)he is on anticoagulation therapy.  Please advise as to whether the patient may come off their therapy of Eliquis 2 days prior to their procedure which is scheduled for 12/14/17.  Please route your response to Marlon Pel, CMA or fax response to [phone].  Sincerely,    [facility]

## 2017-11-10 NOTE — Patient Instructions (Signed)
You have been scheduled for an endoscopy and colonoscopy. Please follow the written instructions given to you at your visit today. Please pick up your prep supplies at the pharmacy within the next 1-3 days. If you use inhalers (even only as needed), please bring them with you on the day of your procedure. Your physician has requested that you go to www.startemmi.com and enter the access code given to you at your visit today. This web site gives a general overview about your procedure. However, you should still follow specific instructions given to you by our office regarding your preparation for the procedure.  We have sent the following medications to your pharmacy for you to pick up at your convenience: ferrous sulfate.   Normal BMI (Body Mass Index- based on height and weight) is between 23 and 30. Your BMI today is Body mass index is 26.09 kg/m. [place] Kitchen Please consider follow up  regarding your BMI with your Primary Care Provider.  Thank you for choosing me and [name].  [name]. Dagoberto Ligas., MD., [facility] Regal
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
## 2017-11-11 ENCOUNTER — Other Ambulatory Visit: Payer: Self-pay | Admitting: *Deleted

## 2017-11-11 ENCOUNTER — Encounter: Payer: Self-pay | Admitting: *Deleted

## 2017-11-11 NOTE — Telephone Encounter (Signed)
Patient informed to hold Eliquis 2 days prior to his procedure on 12/12/17 and 12/13/17.  Patient verbalized understanding that he wrote it on his paper work given yesterday at office visit.

## 2017-11-11 NOTE — Patient Outreach (Signed)
[facility]) Care Management  11/11/2017  MAKYI LEDO January 14, 1941 [id number]   CSW made a third and final attempt to try and contact patient today to perform phone assessment, as well as assess and assist with social work needs and services, without success.  A HIPAA compliant message was left for patient on voicemail.  CSW will proceed with case closure, as required number of phone attempts have been made and an outreach letter was mailed to patient's home allowing 10 business days for a response.   [name], BSW, MSW, LCSW  Licensed Education officer, environmental Health System  Mailing [place] [address] [place], [place] [address] Physical Address-[address], [place], [place] [address] Toll Free Main # [phone] Fax # [phone] Cell # [phone]  Office # [phone] [email or web address]

## 2017-11-11 NOTE — Telephone Encounter (Signed)
OK to hold the Eliquis for 48 hours prior to procedure, and restart 12 hours post procedure as long as adequate hemostasis was achieved

## 2017-11-16 ENCOUNTER — Other Ambulatory Visit: Payer: Self-pay | Admitting: *Deleted

## 2017-11-16 NOTE — Patient Outreach (Signed)
[facility]) Care Management  11/16/2017  [name] [birth year] [id number]   3rd attempt made to contact member after multiple unsuccessful attempts.  He report he has been busy outside of the home, but report he is doing fine.  He denies any urgent concerns.  He is advised that LCSW and pharmacist have both been attempting to contact him to provide services, but have been unsuccessful.  Provided with contact information for both disciplines, advised to contact them today.  He agrees to home visit within the next 2 weeks.  Advised of importance of keeping this appointment and possibility of case closure if unsuccessful.  He verbalizes understanding.  [name], [place], MSN [place] [phone]

## 2017-11-30 ENCOUNTER — Other Ambulatory Visit: Payer: Self-pay | Admitting: *Deleted

## 2017-11-30 ENCOUNTER — Ambulatory Visit: Payer: Self-pay | Admitting: *Deleted

## 2017-11-30 NOTE — Patient Outreach (Signed)
[facility]) Care Management  11/30/2017  [name] [birth year] [id number]   Call placed to member this morning to confirm home visit scheduled for today.  He state the best time to come is 1130.  Arrived at [place] home at the time he requested, no answer.  This care manager knocked several times and waited approximately 10-15 minutes without success.  Will follow up with member within the next week, if remain unsuccessful will close case as member has been difficult to maintain contact.  LCSW and pharmacy have already closed due to this reason.  [name], [place], MSN [place] [phone]

## 2017-12-03 ENCOUNTER — Other Ambulatory Visit: Payer: Self-pay | Admitting: *Deleted

## 2017-12-03 ENCOUNTER — Encounter: Payer: Self-pay | Admitting: Gastroenterology

## 2017-12-03 NOTE — Patient Outreach (Signed)
[facility]) Care Management  12/03/2017  [name] July 20, 1941 [id number]   Call placed to member to follow up on missed home visit, no answer.  HIPAA compliant voice message left.  Will close case at this time.  Will notify primary MD and member of case closure.  [name], [place], MSN [place] [phone]

## 2017-12-14 ENCOUNTER — Telehealth: Payer: Self-pay | Admitting: Gastroenterology

## 2017-12-14 ENCOUNTER — Encounter: Payer: Medicare Other | Admitting: Gastroenterology

## 2017-12-14 NOTE — Telephone Encounter (Signed)
Charge for double procedure no show  Needs office appt prior to rescheduling with me or an APP

## 2018-01-13 ENCOUNTER — Ambulatory Visit: Payer: Medicare Other | Admitting: Internal Medicine

## 2018-01-13 DIAGNOSIS — Z0289 Encounter for other administrative examinations: Secondary | ICD-10-CM

## 2018-01-27 ENCOUNTER — Other Ambulatory Visit (INDEPENDENT_AMBULATORY_CARE_PROVIDER_SITE_OTHER): Payer: Medicare Other

## 2018-01-27 ENCOUNTER — Encounter: Payer: Self-pay | Admitting: Internal Medicine

## 2018-01-27 ENCOUNTER — Other Ambulatory Visit: Payer: Self-pay | Admitting: Internal Medicine

## 2018-01-27 ENCOUNTER — Ambulatory Visit (INDEPENDENT_AMBULATORY_CARE_PROVIDER_SITE_OTHER): Payer: Medicare Other | Admitting: Internal Medicine

## 2018-01-27 VITALS — BP 162/94 | HR 96 | Temp 97.6°F | Ht 66.5 in | Wt 167.0 lb

## 2018-01-27 DIAGNOSIS — E785 Hyperlipidemia, unspecified: Secondary | ICD-10-CM | POA: Diagnosis not present

## 2018-01-27 DIAGNOSIS — D509 Iron deficiency anemia, unspecified: Secondary | ICD-10-CM

## 2018-01-27 DIAGNOSIS — N183 Chronic kidney disease, stage 3 unspecified: Secondary | ICD-10-CM

## 2018-01-27 DIAGNOSIS — E559 Vitamin D deficiency, unspecified: Secondary | ICD-10-CM | POA: Insufficient documentation

## 2018-01-27 DIAGNOSIS — R7303 Prediabetes: Secondary | ICD-10-CM

## 2018-01-27 DIAGNOSIS — I10 Essential (primary) hypertension: Secondary | ICD-10-CM | POA: Diagnosis not present

## 2018-01-27 LAB — URINALYSIS, ROUTINE W REFLEX MICROSCOPIC
Bilirubin Urine: NEGATIVE
Leukocytes, UA: NEGATIVE
NITRITE: NEGATIVE
Specific Gravity, Urine: 1.025 (ref 1.000–1.030)
Urine Glucose: NEGATIVE
Urobilinogen, UA: 0.2 (ref 0.0–1.0)
WBC, UA: NONE SEEN (ref 0–?)
pH: 6 (ref 5.0–8.0)

## 2018-01-27 LAB — HEPATIC FUNCTION PANEL
ALT: 10 U/L (ref 0–53)
AST: 16 U/L (ref 0–37)
Albumin: 3.8 g/dL (ref 3.5–5.2)
Alkaline Phosphatase: 64 U/L (ref 39–117)
BILIRUBIN DIRECT: 0 mg/dL (ref 0.0–0.3)
BILIRUBIN TOTAL: 0.3 mg/dL (ref 0.2–1.2)
Total Protein: 7.5 g/dL (ref 6.0–8.3)

## 2018-01-27 LAB — IBC PANEL
IRON: 65 ug/dL (ref 42–165)
Saturation Ratios: 17 % — ABNORMAL LOW (ref 20.0–50.0)
TRANSFERRIN: 273 mg/dL (ref 212.0–360.0)

## 2018-01-27 LAB — VITAMIN D 25 HYDROXY (VIT D DEFICIENCY, FRACTURES): VITD: 11.02 ng/mL — AB (ref 30.00–100.00)

## 2018-01-27 LAB — CBC WITH DIFFERENTIAL/PLATELET
Basophils Absolute: 0 10*3/uL (ref 0.0–0.1)
Basophils Relative: 0.3 % (ref 0.0–3.0)
EOS ABS: 0.1 10*3/uL (ref 0.0–0.7)
Eosinophils Relative: 1.8 % (ref 0.0–5.0)
HCT: 38.4 % — ABNORMAL LOW (ref 39.0–52.0)
Hemoglobin: 12.2 g/dL — ABNORMAL LOW (ref 13.0–17.0)
Lymphocytes Relative: 32.7 % (ref 12.0–46.0)
Lymphs Abs: 1.3 10*3/uL (ref 0.7–4.0)
MCHC: 31.9 g/dL (ref 30.0–36.0)
MCV: 70.3 fl — ABNORMAL LOW (ref 78.0–100.0)
MONO ABS: 0.4 10*3/uL (ref 0.1–1.0)
Monocytes Relative: 9.5 % (ref 3.0–12.0)
NEUTROS ABS: 2.2 10*3/uL (ref 1.4–7.7)
Neutrophils Relative %: 55.7 % (ref 43.0–77.0)
PLATELETS: 154 10*3/uL (ref 150.0–400.0)
RBC: 5.46 Mil/uL (ref 4.22–5.81)
RDW: 15.8 % — AB (ref 11.5–15.5)
WBC: 4 10*3/uL (ref 4.0–10.5)

## 2018-01-27 LAB — BASIC METABOLIC PANEL
BUN: 22 mg/dL (ref 6–23)
CHLORIDE: 106 meq/L (ref 96–112)
CO2: 30 mEq/L (ref 19–32)
Calcium: 9.1 mg/dL (ref 8.4–10.5)
Creatinine, Ser: 1.37 mg/dL (ref 0.40–1.50)
GFR: 64.96 mL/min (ref >60.00)
GLUCOSE: 107 mg/dL — AB (ref 70–99)
POTASSIUM: 3.6 meq/L (ref 3.5–5.1)
Sodium: 142 mEq/L (ref 135–145)

## 2018-01-27 LAB — LIPID PANEL
CHOLESTEROL: 247 mg/dL — AB (ref 0–200)
HDL: 44 mg/dL (ref >39.00)
LDL CALC: 173 mg/dL — AB (ref 0–99)
NONHDL: 203.01
Total CHOL/HDL Ratio: 6
Triglycerides: 152 mg/dL — ABNORMAL HIGH (ref 0.0–149.0)
VLDL: 30.4 mg/dL (ref 0.0–40.0)

## 2018-01-27 LAB — TSH: TSH: 0.92 u[IU]/mL (ref 0.35–4.50)

## 2018-01-27 LAB — HEMOGLOBIN A1C: HEMOGLOBIN A1C: 6.5 % (ref 4.6–6.5)

## 2018-01-27 LAB — FERRITIN: Ferritin: 53.7 ng/mL (ref 22.0–322.0)

## 2018-01-27 MED ORDER — POTASSIUM CHLORIDE CRYS ER 20 MEQ PO TBCR: 40.0000 meq | EXTENDED_RELEASE_TABLET | Freq: Every day | ORAL | 3 refills | Status: DC

## 2018-01-27 MED ORDER — LISINOPRIL 2.5 MG PO TABS: 2.5000 mg | ORAL_TABLET | Freq: Every day | ORAL | 3 refills | Status: DC

## 2018-01-27 MED ORDER — CARVEDILOL 25 MG PO TABS: 25.0000 mg | ORAL_TABLET | Freq: Two times a day (BID) | ORAL | 3 refills | Status: DC

## 2018-01-27 MED ORDER — ATORVASTATIN CALCIUM 80 MG PO TABS: 80.0000 mg | ORAL_TABLET | Freq: Every day | ORAL | 3 refills | Status: DC

## 2018-01-27 MED ORDER — GABAPENTIN 300 MG PO CAPS: 300.0000 mg | ORAL_CAPSULE | Freq: Every day | ORAL | 1 refills | Status: DC

## 2018-01-27 MED ORDER — PROAIR RESPICLICK 108 (90 BASE) MCG/ACT IN AEPB: 2.0000 | INHALATION_SPRAY | Freq: Four times a day (QID) | RESPIRATORY_TRACT | 11 refills | Status: DC | PRN

## 2018-01-27 MED ORDER — APIXABAN 5 MG PO TABS: 5.0000 mg | ORAL_TABLET | Freq: Two times a day (BID) | ORAL | 3 refills | Status: DC

## 2018-01-27 MED ORDER — TAMSULOSIN HCL 0.4 MG PO CAPS: 0.4000 mg | ORAL_CAPSULE | Freq: Every day | ORAL | 3 refills | Status: DC

## 2018-01-27 MED ORDER — ISOSORBIDE MONONITRATE ER 60 MG PO TB24: 60.0000 mg | ORAL_TABLET | Freq: Every day | ORAL | 3 refills | Status: DC

## 2018-01-27 NOTE — Progress Notes (Signed)
Subjective:    Patient ID: [name], male    DOB: Dec 28, 1940, 77 y.o.   MRN: [id number]  HPI  Here to f/u, did see GI April 2019 with iron def anemia but did not f/u with egd and colonoscopy.  Also saw urology "some time ago" but Denies urinary symptoms such as dysuria, frequency, urgency, flank pain, hematuria or n/v, fever, chills. Denies worsening reflux, abd pain, dysphagia, n/v, bowel change or blood. Wt Readings from Last 3 Encounters:  01/27/18 167 lb (75.8 kg)  11/10/17 164 lb 2 oz (74.4 kg)  09/16/17 156 lb (70.8 kg)   Past Medical History:  Diagnosis Date  . Anemia, iron deficiency 08/15/2011  . CAD (coronary artery disease)    cath in 1/08: EF 60%, mild dilated Ao root, oD2 30%, LAD 20-30%.  . Dilated aortic root ([place]) 08/15/2011  . Dyslipidemia   . Dysrhythmia   . History of MI (myocardial infarction) 08/15/2011  . History of pneumonia 08/15/2011  . History of stroke   . Hyperlipidemia   . Hypertension    echocardiogram 6/10: Moderate LVH, EF 55-65%, mild, mild MR, MAC, mild LAE, PASP 32  . Myocardial infarction ([place])   . Paroxysmal atrial fibrillation (HCC)   . Prediabetes 10/08/2015  . Preoperative cardiovascular examination 10/09/2015  . Shortness of breath dyspnea   . Stroke ([place]) 10/2011  . 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 CARDIAC CATHETERIZATION    . ENDARTERECTOMY Right 10/10/2015   Procedure: ENDARTERECTOMY CAROTID-RIGHT;  Surgeon: Serafina Mitchell, MD;  Location: [facility] OR;  Service: Vascular;  Laterality: Right;  . IR RADIOLOGIST EVAL & MGMT  02/11/2017  . IR RADIOLOGIST EVAL & MGMT  03/24/2017    reports that he has never smoked. He has never used smokeless tobacco. He reports that he does not drink alcohol or use drugs. family history includes Aneurysm in his sister. No Known Allergies Current Outpatient Medications on File Prior to Visit  Medication Sig Dispense Refill  . aspirin EC 81  MG tablet Take 1 tablet (81 mg total) by mouth daily. 30 tablet 0  . ferrous sulfate 325 (65 FE) MG tablet Take 1 tablet (325 mg total) by mouth 2 (two) times daily with a meal. 60 tablet 11   No current facility-administered medications on file prior to visit.    Review of Systems  Constitutional: Negative for other unusual diaphoresis or sweats HENT: Negative for ear discharge or swelling Eyes: Negative for other worsening visual disturbances Respiratory: Negative for stridor or other swelling  Gastrointestinal: Negative for worsening distension or other blood Genitourinary: Negative for retention or other urinary change Musculoskeletal: Negative for other MSK pain or swelling Skin: Negative for color change or other new lesions Neurological: Negative for worsening tremors and other numbness  Psychiatric/Behavioral: Negative for worsening agitation or other fatigue All other system neg per pt    Objective:   Physical Exam BP (!) 162/94   Pulse 96   Temp 97.6 F (36.4 C) (Oral)   Ht 5' 6.5" (1.689 m)   Wt 167 lb (75.8 kg)   SpO2 97%   BMI 26.55 kg/m  VS noted,  Constitutional: Pt appears in NAD HENT: Head: NCAT.  Right Ear: External ear normal.  Left Ear: External ear normal.  Eyes: . Pupils are equal, round, and reactive to light. Conjunctivae and EOM are normal Nose: without d/c or deformity Neck: Neck supple. Gross normal ROM Cardiovascular:  Normal rate and regular rhythm.   Pulmonary/Chest: Effort normal and breath sounds without rales or wheezing.  Abd:  Soft, NT, ND, + BS, no organomegaly Neurological: Pt is alert. At baseline orientation, motor grossly intact Skin: Skin is warm. No rashes, other new lesions, no LE edema Psychiatric: Pt behavior is normal without agitation  No other exam findings    Assessment & Plan:

## 2018-01-27 NOTE — Assessment & Plan Note (Signed)
stable overall by history and exam, recent data reviewed with pt, and pt to continue medical treatment as before,  to f/u any worsening symptoms or concerns, for f/u lab today 

## 2018-01-27 NOTE — Assessment & Plan Note (Signed)
stable overall by history and exam, recent data reviewed with pt, and pt to continue medical treatment as before,  to f/u any worsening symptoms or concerns Lab Results  Component Value Date   HGBA1C 6.5 01/27/2018

## 2018-01-27 NOTE — Assessment & Plan Note (Signed)
Med compliance is possible, o/w stable overall by history and exam, recent data reviewed with pt, and pt to restart medical treatment as before,  to f/u any worsening symptoms or concerns BP Readings from Last 3 Encounters:  01/27/18 (!) 162/94  11/10/17 112/68  10/18/17 (!) 210/102

## 2018-01-27 NOTE — Patient Instructions (Signed)
Please continue all other medications as before, and refills have been done if requested.  Please have the pharmacy call with any other refills you may need.  Please continue your efforts at being more active, low cholesterol diet, and weight control.  You are otherwise up to date with prevention measures today.  Please keep your appointments with your specialists as you may have planned  Please remember to contact Dr Fuller Plan to have your colonoscopy and EGD as we discussed  Please go to the LAB in the Basement (turn left off the elevator) for the tests to be done today  You will be contacted by phone if any changes need to be made immediately.  Otherwise, you will receive a letter about your results with an explanation, but please check with MyChart first.  Please remember to sign up for MyChart if you have not done so, as this will be important to you in the future with finding out test results, communicating by private email, and scheduling acute appointments online when needed.

## 2018-01-27 NOTE — Assessment & Plan Note (Signed)
No overt bleeding, for lab f/u today

## 2018-01-27 NOTE — Assessment & Plan Note (Signed)
Also for vit d level with labs 

## 2018-01-28 ENCOUNTER — Telehealth: Payer: Self-pay

## 2018-01-28 NOTE — Telephone Encounter (Signed)
-----   Message from Biagio Borg, MD sent at 01/27/2018  4:36 PM EDT ----- Left message on MyChart, pt to cont same tx except  The test results show that your current treatment is OK, except the LDL cholesterol is quite high, and the Vit D is low.  Please remember to take all of your medications including the Lipitor (atorvastatin).  Please also take OTC Vit D 2000 units per day indefinitely.Redmond Baseman to please inform pt,

## 2018-01-28 NOTE — Telephone Encounter (Signed)
Pt has been informed and expressed understanding.  

## 2018-03-18 ENCOUNTER — Telehealth: Payer: Self-pay

## 2018-03-18 ENCOUNTER — Ambulatory Visit (AMBULATORY_SURGERY_CENTER): Payer: Self-pay

## 2018-03-18 ENCOUNTER — Other Ambulatory Visit: Payer: Self-pay

## 2018-03-18 VITALS — Ht 66.5 in | Wt 159.2 lb

## 2018-03-18 DIAGNOSIS — D509 Iron deficiency anemia, unspecified: Secondary | ICD-10-CM

## 2018-03-18 MED ORDER — NA SULFATE-K SULFATE-MG SULF 17.5-3.13-1.6 GM/177ML PO SOLN: 1.0000 | Freq: Once | ORAL | 0 refills | Status: AC

## 2018-03-18 NOTE — Progress Notes (Signed)
No egg or soy allergy known to patient  No issues with past sedation with any surgeries  or procedures, no intubation problems  No diet pills per patient No home 02 use per patient  Pt is taking Eliquis instructed to hold  blood thinner medication 2 days before procedure Pt denies issues with constipation  No A fib or A flutter  EMMI video sent to pt's e mail , don't have a email. Pt given permission to call Connie/ his girlfriend to go over prep instructions. Pt is taking Eliquis and want to reinforce  instructions to someone else. Not sure if pt is fully understanding how and when to take prep and to stop his Eliquis 2 days prior.[name] number is [phone]. Pt verbalize that he was told this procedure is going to help his legs.

## 2018-03-18 NOTE — Telephone Encounter (Signed)
Called and left a message with [name], pts friend. Pt seems slightly confused on procedure and prep and medication instructions. Left a message with [name] for her to make sure she is able to help him with his prep instructions and making sure to hold his Eliquis 2 days prior to the procedure.

## 2018-03-21 NOTE — Telephone Encounter (Signed)
I called [name] to see if I could clear up any instructions for [name]. No answer. I did not leave a message because I didn't know if we had permission to do so. We will try to reach her tomorrow.   Riki Sheer, LPN ( [place] )

## 2018-03-24 NOTE — Telephone Encounter (Signed)
Left a voice message for [name] to call back concerning pt procedure instructions.

## 2018-03-25 NOTE — Telephone Encounter (Signed)
Spoke with pt, he states he is in [employer] with [place]. Informed pt we were following up on any prep instructions or questions he had or if [name] had any questions.He states he would have  [name] call us back if she has any questions. Gwyndolyn Saxon

## 2018-03-30 ENCOUNTER — Encounter: Payer: Self-pay | Admitting: Gastroenterology

## 2018-04-12 ENCOUNTER — Ambulatory Visit (AMBULATORY_SURGERY_CENTER): Payer: Medicare Other | Admitting: Gastroenterology

## 2018-04-12 ENCOUNTER — Encounter: Payer: Self-pay | Admitting: Gastroenterology

## 2018-04-12 VITALS — BP 194/104 | HR 76 | Temp 98.9°F | Resp 13 | Ht 66.0 in | Wt 159.0 lb

## 2018-04-12 DIAGNOSIS — D500 Iron deficiency anemia secondary to blood loss (chronic): Secondary | ICD-10-CM

## 2018-04-12 DIAGNOSIS — B9681 Helicobacter pylori [H. pylori] as the cause of diseases classified elsewhere: Secondary | ICD-10-CM | POA: Diagnosis not present

## 2018-04-12 DIAGNOSIS — Z1211 Encounter for screening for malignant neoplasm of colon: Secondary | ICD-10-CM | POA: Diagnosis not present

## 2018-04-12 DIAGNOSIS — D122 Benign neoplasm of ascending colon: Secondary | ICD-10-CM | POA: Diagnosis not present

## 2018-04-12 DIAGNOSIS — K229 Disease of esophagus, unspecified: Secondary | ICD-10-CM

## 2018-04-12 DIAGNOSIS — K2900 Acute gastritis without bleeding: Secondary | ICD-10-CM

## 2018-04-12 DIAGNOSIS — K635 Polyp of colon: Secondary | ICD-10-CM

## 2018-04-12 DIAGNOSIS — K2270 Barrett's esophagus without dysplasia: Secondary | ICD-10-CM | POA: Diagnosis not present

## 2018-04-12 DIAGNOSIS — K2950 Unspecified chronic gastritis without bleeding: Secondary | ICD-10-CM | POA: Diagnosis not present

## 2018-04-12 MED ORDER — SODIUM CHLORIDE 0.9 % IV SOLN: 500.0000 mL | Freq: Once | INTRAVENOUS | Status: DC

## 2018-04-12 NOTE — Progress Notes (Signed)
Pt's states no medical or surgical changes since previsit or office visit. 

## 2018-04-12 NOTE — Progress Notes (Signed)
DANGER OF HIGH BP EXPLAINED TO PT & CAREPARTNER AND INSTRUCTED PT TO TAKE BP MEDICATION WHEN GETS HOME AND TO MAKE AN APPOINTMENT WITH DR [place] BP.

## 2018-04-12 NOTE — Op Note (Signed)
[place] Patient Name: [name] Procedure Date: 04/12/2018 1:21 PM MRN: [id number] Endoscopist: Ladene Artist , MD Age: 77 Referring MD:  Date of Birth: 07/12/1941 Gender: Male Account #: [id number] Procedure:                Upper GI endoscopy Indications:              Iron deficiency anemia Medicines:                Monitored Anesthesia Care Procedure:                Pre-Anesthesia Assessment:                           - Prior to the procedure, a History and Physical                            was performed, and patient medications and                            allergies were reviewed. The patient's tolerance of                            previous anesthesia was also reviewed. The risks                            and benefits of the procedure and the sedation                            options and risks were discussed with the patient.                            All questions were answered, and informed consent                            was obtained. Prior Anticoagulants: The patient has                            taken Eliquis (apixaban), last dose was 2 days                            prior to procedure. ASA Grade Assessment: III - A                            patient with severe systemic disease. After                            reviewing the risks and benefits, the patient was                            deemed in satisfactory condition to undergo the                            procedure.  After obtaining informed consent, the endoscope was                            passed under direct vision. Throughout the                            procedure, the patient's blood pressure, pulse, and                            oxygen saturations were monitored continuously. The                            Endoscope was introduced through the mouth, and                            advanced to the second part of duodenum. The upper          GI endoscopy was accomplished without difficulty.                            The patient tolerated the procedure well. Scope In: Scope Out: Findings:                 The Z-line was variable and was found at the                            gastroesophageal junction. Biopsies were taken with                            a cold forceps for histology.                           The exam of the esophagus was otherwise normal.                           Diffuse moderate inflammation characterized by                            erythema, friability and granularity was found in                            the entire examined stomach. Biopsies were taken                            with a cold forceps for histology.                           Diffuse atrophic mucosa was found in the gastric                            fundus and in the gastric body.                           The exam of the stomach was otherwise normal.  The duodenal bulb and second portion of the                            duodenum were normal. Complications:            No immediate complications. Estimated Blood Loss:     Estimated blood loss was minimal. Impression:               - Z-line variable, at the gastroesophageal                            junction. Biopsied.                           - Gastritis. Biopsied.                           - Gastric mucosal atrophy.                           - Normal duodenal bulb and second portion of the                            duodenum. Recommendation:           - Patient has a contact number available for                            emergencies. The signs and symptoms of potential                            delayed complications were discussed with the                            patient. Return to normal activities tomorrow.                            Written discharge instructions were provided to the                            patient.                            - Resume previous diet.                           - Continue present medications.                           - Await pathology results.                           - Resume Eliquis (apixaban) at prior dose tomorrow.                            Refer to managing physician for further adjustment  of therapy.                           - Consider capsule endoscopy for further evaluation                            pending pathology review. Ladene Artist, MD 04/12/2018 2:10:39 PM This report has been signed electronically.

## 2018-04-12 NOTE — Progress Notes (Signed)
A and O x3. Report to RN. Tolerated MAC anesthesia well.Teeth unchanged after procedure.

## 2018-04-12 NOTE — Op Note (Signed)
[place] Patient Name: [name] Procedure Date: 04/12/2018 1:21 PM MRN: [id number] Endoscopist: Ladene Artist , MD Age: 77 Referring MD:  Date of Birth: [birth year] Gender: Male Account #: [id number] Procedure:                Colonoscopy Indications:              Iron deficiency anemia Medicines:                Monitored Anesthesia Care Procedure:                Pre-Anesthesia Assessment:                           - Prior to the procedure, a History and Physical                            was performed, and patient medications and                            allergies were reviewed. The patient's tolerance of                            previous anesthesia was also reviewed. The risks                            and benefits of the procedure and the sedation                            options and risks were discussed with the patient.                            All questions were answered, and informed consent                            was obtained. Prior Anticoagulants: The patient has                            taken Eliquis (apixaban), last dose was 2 days                            prior to procedure. ASA Grade Assessment: III - A                            patient with severe systemic disease. After                            reviewing the risks and benefits, the patient was                            deemed in satisfactory condition to undergo the                            procedure.  After obtaining informed consent, the colonoscope                            was passed under direct vision. Throughout the                            procedure, the patient's blood pressure, pulse, and                            oxygen saturations were monitored continuously. The                            Model CF-HQ190L [id number]) scope was introduced                            through the anus and advanced to the the cecum,    identified by appendiceal orifice and ileocecal                            valve. The ileocecal valve, appendiceal orifice,                            and rectum were photographed. The quality of the                            bowel preparation was excellent. The colonoscopy                            was performed without difficulty. The patient                            tolerated the procedure well. Scope In: 1:28:36 PM Scope Out: 1:50:27 PM Scope Withdrawal Time: 0 hours 11 minutes 40 seconds  Total Procedure Duration: 0 hours 21 minutes 51 seconds  Findings:                 The perianal and digital rectal examinations were                            normal.                           A 9 mm polyp was found in the ascending colon. The                            polyp was semi-pedunculated. The polyp was removed                            with a cold snare. Resection and retrieval were                            complete.                           A few small-mouthed diverticula were found in the  left colon.                           Internal hemorrhoids were found during                            retroflexion. The hemorrhoids were small and Grade                            I (internal hemorrhoids that do not prolapse).                           The entire examined colon appeared normal on direct                            and retroflexion views. Complications:            No immediate complications. Estimated blood loss:                            None. Estimated Blood Loss:     Estimated blood loss: none. Impression:               - One 9 mm polyp in the ascending colon, removed                            with a cold snare. Resected and retrieved.                           - Mild diverticulosis in the left colon.                           - Internal hemorrhoids.                           - The entire examined colon is normal on direct and                             retroflexion views. Recommendation:           - Repeat colonoscopy in 5 years for surveillance if                            polyp is precanerous.                           - Resume Eliquis (apixaban) tomorrow at prior dose.                            Refer to managing physician for further adjustment                            of therapy.                           - Patient has a contact number available for  emergencies. The signs and symptoms of potential                            delayed complications were discussed with the                            patient. Return to normal activities tomorrow.                            Written discharge instructions were provided to the                            patient.                           - Resume previous diet.                           - Continue present medications.                           - Await pathology results.                           - No aspirin, ibuprofen, naproxen, or other                            non-steroidal anti-inflammatory drugs for 2 weeks                            after polyp removal. Ladene Artist, MD 04/12/2018 2:04:56 PM This report has been signed electronically.

## 2018-04-12 NOTE — Patient Instructions (Signed)
RESUME ELIQUIS AT PRIOR DOSE TOMORROW ,REFER TO MANAGING PHYSICIAN FOR FURTHER ADJUSTMENT   GET BACK ON BLOOD PRESSURE MEDICATION AS BLOOD PRESSURE VERY HIGH  NO ASPIRIN,NAPROXEN,OR OTHER NON STEROIDAL ANTI-INFLAMMATORY MEDICATIONS FOR 2 WEEKS AFTER POLYP REMOVAL  Information on gastritis given to you today  Await biopsy results  INFORMATION ON POLYPS ,DIVERTICULOSIS ,AND HEMORRHOIDS GIVEN TO YOU TODAY    YOU HAD AN ENDOSCOPIC PROCEDURE TODAY AT [facility]:   Refer to the procedure report that was given to you for any specific questions about what was found during the examination.  If the procedure report does not answer your questions, please call your gastroenterologist to clarify.  If you requested that your care partner not be given the details of your procedure findings, then the procedure report has been included in a sealed envelope for you to review at your convenience later.  YOU SHOULD EXPECT: Some feelings of bloating in the abdomen. Passage of more gas than usual.  Walking can help get rid of the air that was put into your GI tract during the procedure and reduce the bloating. If you had a lower endoscopy (such as a colonoscopy or flexible sigmoidoscopy) you may notice spotting of blood in your stool or on the toilet paper. If you underwent a bowel prep for your procedure, you may not have a normal bowel movement for a few days.  Please Note:  You might notice some irritation and congestion in your nose or some drainage.  This is from the oxygen used during your procedure.  There is no need for concern and it should clear up in a day or so.  SYMPTOMS TO REPORT IMMEDIATELY:   Following lower endoscopy (colonoscopy or flexible sigmoidoscopy):  Excessive amounts of blood in the stool  Significant tenderness or worsening of abdominal pains  Swelling of the abdomen that is new, acute  Fever of 100F or higher   Following upper endoscopy (EGD)  Vomiting of blood  or coffee ground material  New chest pain or pain under the shoulder blades  Painful or persistently difficult swallowing  New shortness of breath  Fever of 100F or higher  Black, tarry-looking stools  For urgent or emergent issues, a gastroenterologist can be reached at any hour by calling [phone].   DIET:  We do recommend a small meal at first, but then you may proceed to your regular diet.  Drink plenty of fluids but you should avoid alcoholic beverages for 24 hours.  ACTIVITY:  You should plan to take it easy for the rest of today and you should NOT DRIVE or use heavy machinery until tomorrow (because of the sedation medicines used during the test).    FOLLOW UP: Our staff will call the number listed on your records the next business day following your procedure to check on you and address any questions or concerns that you may have regarding the information given to you following your procedure. If we do not reach you, we will leave a message.  However, if you are feeling well and you are not experiencing any problems, there is no need to return our call.  We will assume that you have returned to your regular daily activities without incident.  If any biopsies were taken you will be contacted by phone or by letter within the next 1-3 weeks.  Please call us at [phone] if you have not heard about the biopsies in 3 weeks.    SIGNATURES/CONFIDENTIALITY: You and/or  your care partner have signed paperwork which will be entered into your electronic medical record.  These signatures attest to the fact that that the information above on your After Visit Summary has been reviewed and is understood.  Full responsibility of the confidentiality of this discharge information lies with you and/or your care-partner.

## 2018-04-12 NOTE — Progress Notes (Signed)
Called to room to assist during endoscopic procedure.  Patient ID and intended procedure confirmed with present staff. Received instructions for my participation in the procedure from the performing physician.  

## 2018-04-13 ENCOUNTER — Telehealth: Payer: Self-pay | Admitting: *Deleted

## 2018-04-13 ENCOUNTER — Telehealth: Payer: Self-pay

## 2018-04-13 NOTE — Telephone Encounter (Signed)
  Follow up Call-  Call back number 04/12/2018  Post procedure Call Back phone  # [phone]  Permission to leave phone message Yes  Some recent data might be hidden     Left message

## 2018-04-13 NOTE — Telephone Encounter (Signed)
No answer, message left for the patient. 

## 2018-04-27 ENCOUNTER — Other Ambulatory Visit: Payer: Self-pay

## 2018-04-27 MED ORDER — OMEPRAZOLE 20 MG PO CPDR: 20.0000 mg | DELAYED_RELEASE_CAPSULE | Freq: Two times a day (BID) | ORAL | 0 refills | Status: DC

## 2018-04-27 MED ORDER — BIS SUBCIT-METRONID-TETRACYC 140-125-125 MG PO CAPS: 3.0000 | ORAL_CAPSULE | Freq: Three times a day (TID) | ORAL | 0 refills | Status: DC

## 2018-04-29 ENCOUNTER — Telehealth: Payer: Self-pay

## 2018-04-29 NOTE — Telephone Encounter (Signed)
Received fax from the pharmacy stating Pylera is too expensive. Left a message for patient to return my call.

## 2018-05-03 NOTE — Telephone Encounter (Signed)
Left a message for patient to return my call. 

## 2018-05-04 NOTE — Telephone Encounter (Signed)
Left a message for patient to return my call. 

## 2018-06-23 ENCOUNTER — Encounter (HOSPITAL_COMMUNITY): Payer: Self-pay | Admitting: Pharmacy Technician

## 2018-06-23 ENCOUNTER — Other Ambulatory Visit: Payer: Self-pay

## 2018-06-23 ENCOUNTER — Ambulatory Visit (INDEPENDENT_AMBULATORY_CARE_PROVIDER_SITE_OTHER): Payer: Medicare Other | Admitting: Nurse Practitioner

## 2018-06-23 ENCOUNTER — Encounter: Payer: Self-pay | Admitting: Nurse Practitioner

## 2018-06-23 ENCOUNTER — Inpatient Hospital Stay (HOSPITAL_COMMUNITY)
Admission: EM | Admit: 2018-06-23 | Discharge: 2018-06-26 | DRG: 291 | Disposition: A | Discharge status: Home / Self Care | Payer: Medicare Other | Attending: Internal Medicine | Admitting: Internal Medicine

## 2018-06-23 VITALS — BP 210/100 | HR 71 | Ht 66.0 in | Wt 170.0 lb

## 2018-06-23 DIAGNOSIS — Z7982 Long term (current) use of aspirin: Secondary | ICD-10-CM

## 2018-06-23 DIAGNOSIS — I639 Cerebral infarction, unspecified: Secondary | ICD-10-CM

## 2018-06-23 DIAGNOSIS — R4781 Slurred speech: Secondary | ICD-10-CM

## 2018-06-23 DIAGNOSIS — D509 Iron deficiency anemia, unspecified: Secondary | ICD-10-CM | POA: Diagnosis present

## 2018-06-23 DIAGNOSIS — Z79899 Other long term (current) drug therapy: Secondary | ICD-10-CM | POA: Diagnosis not present

## 2018-06-23 DIAGNOSIS — N400 Enlarged prostate without lower urinary tract symptoms: Secondary | ICD-10-CM | POA: Diagnosis present

## 2018-06-23 DIAGNOSIS — Z8679 Personal history of other diseases of the circulatory system: Secondary | ICD-10-CM

## 2018-06-23 DIAGNOSIS — D696 Thrombocytopenia, unspecified: Secondary | ICD-10-CM | POA: Diagnosis present

## 2018-06-23 DIAGNOSIS — I5043 Acute on chronic combined systolic (congestive) and diastolic (congestive) heart failure: Secondary | ICD-10-CM | POA: Diagnosis present

## 2018-06-23 DIAGNOSIS — Z9114 Patient's other noncompliance with medication regimen: Secondary | ICD-10-CM

## 2018-06-23 DIAGNOSIS — I252 Old myocardial infarction: Secondary | ICD-10-CM

## 2018-06-23 DIAGNOSIS — G629 Polyneuropathy, unspecified: Secondary | ICD-10-CM

## 2018-06-23 DIAGNOSIS — I10 Essential (primary) hypertension: Secondary | ICD-10-CM

## 2018-06-23 DIAGNOSIS — I169 Hypertensive crisis, unspecified: Secondary | ICD-10-CM

## 2018-06-23 DIAGNOSIS — I110 Hypertensive heart disease with heart failure: Secondary | ICD-10-CM | POA: Diagnosis not present

## 2018-06-23 DIAGNOSIS — I160 Hypertensive urgency: Secondary | ICD-10-CM | POA: Diagnosis present

## 2018-06-23 DIAGNOSIS — I480 Paroxysmal atrial fibrillation: Secondary | ICD-10-CM | POA: Diagnosis not present

## 2018-06-23 DIAGNOSIS — E785 Hyperlipidemia, unspecified: Secondary | ICD-10-CM | POA: Diagnosis present

## 2018-06-23 DIAGNOSIS — I2510 Atherosclerotic heart disease of native coronary artery without angina pectoris: Secondary | ICD-10-CM | POA: Diagnosis not present

## 2018-06-23 DIAGNOSIS — R42 Dizziness and giddiness: Secondary | ICD-10-CM | POA: Diagnosis not present

## 2018-06-23 DIAGNOSIS — Z8673 Personal history of transient ischemic attack (TIA), and cerebral infarction without residual deficits: Secondary | ICD-10-CM

## 2018-06-23 DIAGNOSIS — Z743 Need for continuous supervision: Secondary | ICD-10-CM | POA: Diagnosis not present

## 2018-06-23 DIAGNOSIS — D649 Anemia, unspecified: Secondary | ICD-10-CM | POA: Diagnosis not present

## 2018-06-23 DIAGNOSIS — Z7901 Long term (current) use of anticoagulants: Secondary | ICD-10-CM | POA: Diagnosis not present

## 2018-06-23 LAB — CBC WITH DIFFERENTIAL/PLATELET
ABS IMMATURE GRANULOCYTES: 0.01 10*3/uL (ref 0.00–0.07)
BASOS ABS: 0 10*3/uL (ref 0.0–0.1)
Basophils Relative: 0 %
EOS ABS: 0.1 10*3/uL (ref 0.0–0.5)
Eosinophils Relative: 2 %
HEMATOCRIT: 41.6 % (ref 39.0–52.0)
Hemoglobin: 12 g/dL — ABNORMAL LOW (ref 13.0–17.0)
IMMATURE GRANULOCYTES: 0 %
LYMPHS ABS: 1.5 10*3/uL (ref 0.7–4.0)
Lymphocytes Relative: 29 %
MCH: 21.5 pg — ABNORMAL LOW (ref 26.0–34.0)
MCHC: 28.8 g/dL — ABNORMAL LOW (ref 30.0–36.0)
MCV: 74.7 fL — AB (ref 80.0–100.0)
MONOS PCT: 11 %
Monocytes Absolute: 0.6 10*3/uL (ref 0.1–1.0)
NEUTROS ABS: 2.9 10*3/uL (ref 1.7–7.7)
NEUTROS PCT: 58 %
NRBC: 0 % (ref 0.0–0.2)
PLATELETS: 141 10*3/uL — AB (ref 150–400)
RBC: 5.57 MIL/uL (ref 4.22–5.81)
RDW: 16.5 % — AB (ref 11.5–15.5)
WBC: 5 10*3/uL (ref 4.0–10.5)

## 2018-06-23 LAB — COMPREHENSIVE METABOLIC PANEL
ALBUMIN: 3.6 g/dL (ref 3.5–5.0)
ALK PHOS: 58 U/L (ref 38–126)
ALT: 13 U/L (ref 0–44)
ANION GAP: 5 (ref 5–15)
AST: 19 U/L (ref 15–41)
BUN: 17 mg/dL (ref 8–23)
CO2: 26 mmol/L (ref 22–32)
Calcium: 8.9 mg/dL (ref 8.9–10.3)
Chloride: 108 mmol/L (ref 98–111)
Creatinine, Ser: 1.29 mg/dL — ABNORMAL HIGH (ref 0.61–1.24)
GFR calc non Af Amer: 52 mL/min — ABNORMAL LOW (ref >60)
GLUCOSE: 75 mg/dL (ref 70–99)
Potassium: 3.9 mmol/L (ref 3.5–5.1)
Sodium: 139 mmol/L (ref 135–145)
Total Bilirubin: 0.5 mg/dL (ref 0.3–1.2)
Total Protein: 7.5 g/dL (ref 6.5–8.1)

## 2018-06-23 LAB — I-STAT TROPONIN, ED: Troponin i, poc: 0.04 ng/mL (ref 0.00–0.08)

## 2018-06-23 MED ORDER — ISOSORBIDE MONONITRATE ER 60 MG PO TB24
60.0000 mg | ORAL_TABLET | Freq: Every day | ORAL | Status: DC
  Administered 2018-06-23 – 2018-06-26 (×4): 60 mg via ORAL
  Filled 2018-06-23 (×4): qty 1

## 2018-06-23 MED ORDER — CLONIDINE HCL 0.2 MG PO TABS
0.2000 mg | ORAL_TABLET | Freq: Once | ORAL | Status: AC
  Administered 2018-06-23: 0.2 mg via ORAL
  Filled 2018-06-23: qty 1

## 2018-06-23 MED ORDER — TAMSULOSIN HCL 0.4 MG PO CAPS
0.4000 mg | ORAL_CAPSULE | Freq: Every day | ORAL | Status: DC
  Administered 2018-06-23 – 2018-06-26 (×4): 0.4 mg via ORAL
  Filled 2018-06-23 (×4): qty 1

## 2018-06-23 MED ORDER — FERROUS SULFATE 325 (65 FE) MG PO TABS
325.0000 mg | ORAL_TABLET | Freq: Two times a day (BID) | ORAL | Status: DC
  Administered 2018-06-23 – 2018-06-26 (×6): 325 mg via ORAL
  Filled 2018-06-23 (×8): qty 1

## 2018-06-23 MED ORDER — LABETALOL HCL 5 MG/ML IV SOLN
20.0000 mg | Freq: Once | INTRAVENOUS | Status: AC
  Administered 2018-06-23: 20 mg via INTRAVENOUS
  Filled 2018-06-23: qty 4

## 2018-06-23 MED ORDER — APIXABAN 5 MG PO TABS
5.0000 mg | ORAL_TABLET | Freq: Two times a day (BID) | ORAL | Status: DC
  Administered 2018-06-23 – 2018-06-26 (×7): 5 mg via ORAL
  Filled 2018-06-23 (×8): qty 1

## 2018-06-23 MED ORDER — ALBUTEROL SULFATE 108 (90 BASE) MCG/ACT IN AEPB: 2.0000 | INHALATION_SPRAY | Freq: Four times a day (QID) | RESPIRATORY_TRACT | Status: DC | PRN

## 2018-06-23 MED ORDER — LABETALOL HCL 5 MG/ML IV SOLN
0.5000 mg/min | INTRAVENOUS | Status: DC
  Administered 2018-06-23: 0.5 mg/min via INTRAVENOUS
  Filled 2018-06-23: qty 100

## 2018-06-23 MED ORDER — LABETALOL HCL 5 MG/ML IV SOLN
10.0000 mg | INTRAVENOUS | Status: DC | PRN
  Administered 2018-06-23 – 2018-06-24 (×2): 10 mg via INTRAVENOUS
  Filled 2018-06-23 (×4): qty 4

## 2018-06-23 MED ORDER — GABAPENTIN 300 MG PO CAPS
300.0000 mg | ORAL_CAPSULE | Freq: Every day | ORAL | Status: DC
  Administered 2018-06-23 – 2018-06-25 (×3): 300 mg via ORAL
  Filled 2018-06-23 (×3): qty 1

## 2018-06-23 MED ORDER — SODIUM CHLORIDE 0.9 % IV BOLUS: 1000.0000 mL | Freq: Once | INTRAVENOUS | Status: DC

## 2018-06-23 MED ORDER — ATORVASTATIN CALCIUM 80 MG PO TABS
80.0000 mg | ORAL_TABLET | Freq: Every day | ORAL | Status: DC
  Administered 2018-06-23 – 2018-06-26 (×4): 80 mg via ORAL
  Filled 2018-06-23 (×5): qty 1

## 2018-06-23 MED ORDER — ASPIRIN EC 81 MG PO TBEC
81.0000 mg | DELAYED_RELEASE_TABLET | Freq: Every day | ORAL | Status: DC
  Administered 2018-06-23 – 2018-06-26 (×4): 81 mg via ORAL
  Filled 2018-06-23 (×4): qty 1

## 2018-06-23 MED ORDER — ALBUTEROL SULFATE (2.5 MG/3ML) 0.083% IN NEBU: 2.5000 mg | INHALATION_SOLUTION | Freq: Four times a day (QID) | RESPIRATORY_TRACT | Status: DC | PRN

## 2018-06-23 NOTE — H&P (Signed)
History and Physical    [name] PTW:[id number] DOB: Aug 28, 1940 DOA: 06/23/2018  PCP: Biagio Borg, MD Patient coming from: Home  Chief Complaint: High blood pressure  HPI: [name] is a 77 y.o. male with medical history significant of hypertension, hyperlipidemia, coronary artery disease status post MI, CHF, prior CVA, paroxysmal A. fib presented to the hospital for evaluation of high blood pressure and dizziness from his PCPs office.  Patient states he ran out of all of his medications a week ago except his cholesterol medication.  He denies having any chest pain, shortness of breath, headaches, or abdominal pain.  Denies having any dizziness.  No other complaints.  ED Course: Blood pressure 261/131 on arrival.  I-STAT troponin negative.  EKG with evidence of LVH and T wave inversions in V5 and V6 (similar to prior EKG from April 04, 2017).  Patient received clonidine 0.2 mg once and IV labetalol 20 mg once in the ED. Blood pressure continue to be elevated and he was started on labetalol drip.  Review of Systems: As per HPI otherwise 10 point review of systems negative.  Past Medical History:  Diagnosis Date  . Anemia, iron deficiency 08/15/2011  . CAD (coronary artery disease)    cath in 1/08: EF 60%, mild dilated Ao root, oD2 30%, LAD 20-30%.  . 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 Dyslipidemia   . Dysrhythmia   . History of MI (myocardial infarction) 08/15/2011  . History of pneumonia 08/15/2011  . History of stroke   . Hyperlipidemia   . Hypertension    echocardiogram 6/10: Moderate LVH, EF 55-65%, mild, mild MR, MAC, mild LAE, PASP 32  . Myocardial infarction ([place])   . Paroxysmal atrial fibrillation (HCC)   . Prediabetes 10/08/2015  . Preoperative cardiovascular examination 10/09/2015  . Shortness of breath dyspnea   . Stroke ([place]) 10/2011  . 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 CARDIAC  CATHETERIZATION    . COLONOSCOPY    . ENDARTERECTOMY Right 10/10/2015   Procedure: ENDARTERECTOMY CAROTID-RIGHT;  Surgeon: Serafina Mitchell, MD;  Location: [facility];  Service: Vascular;  Laterality: Right;  . IR RADIOLOGIST EVAL & MGMT  02/11/2017  . IR RADIOLOGIST EVAL & MGMT  03/24/2017     reports that he has never smoked. He has never used smokeless tobacco. He reports that he does not drink alcohol or use drugs.  No Known Allergies  Family History  Problem Relation Age of Onset  . Aneurysm Sister   . Colon cancer Neg Hx     Prior to Admission medications   Medication Sig Start Date End Date Taking? Authorizing Provider  apixaban (ELIQUIS) 5 MG TABS tablet Take 1 tablet (5 mg total) by mouth 2 (two) times daily. 01/27/18  Yes Biagio Borg, MD  aspirin EC 81 MG tablet Take 1 tablet (81 mg total) by mouth daily. 04/06/17  Yes Rosita Fire, MD  atorvastatin (LIPITOR) 80 MG tablet Take 1 tablet (80 mg total) by mouth daily. 01/27/18  Yes Biagio Borg, MD  bismuth-metronidazole-tetracycline [facility]) [phone] MG capsule Take 3 capsules by mouth 4 (four) times daily -  before meals and at bedtime. 04/27/18  Yes Ladene Artist, MD  carvedilol (COREG) 25 MG tablet Take 1 tablet (25 mg total) by mouth 2 (two) times daily. 01/27/18 06/23/18 Yes Biagio Borg, MD  ferrous sulfate 325 (65 FE) MG tablet Take 1  tablet (325 mg total) by mouth 2 (two) times daily with a meal. 11/10/17  Yes Ladene Artist, MD  furosemide (LASIX) 20 MG tablet TAKE 1 TABLET BY MOUTH ONCE DAILY Patient taking differently: Take 20 mg by mouth daily.  01/28/18  Yes Biagio Borg, MD  gabapentin (NEURONTIN) 300 MG capsule Take 1 capsule (300 mg total) by mouth at bedtime. 01/27/18  Yes Biagio Borg, MD  isosorbide mononitrate (IMDUR) 60 MG 24 hr tablet Take 1 tablet (60 mg total) by mouth daily. 01/27/18  Yes Biagio Borg, MD  lisinopril (PRINIVIL,ZESTRIL) 2.5 MG tablet Take 1 tablet (2.5 mg total) by mouth daily.  01/27/18  Yes Biagio Borg, MD  potassium chloride SA (K-DUR,KLOR-CON) 20 MEQ tablet Take 2 tablets (40 mEq total) by mouth daily. 01/27/18  Yes Biagio Borg, MD  PROAIR RESPICLICK 628 [phone] Base) MCG/ACT AEPB Inhale 2 puffs into the lungs every 6 (six) hours as needed (for wheezing or shortness of breath). 01/27/18  Yes Biagio Borg, MD  tamsulosin (FLOMAX) 0.4 MG CAPS capsule Take 1 capsule (0.4 mg total) by mouth daily after supper. 01/27/18  Yes Biagio Borg, MD    Physical Exam: Vitals:   06/23/18 1930 06/23/18 1945 06/23/18 2000 06/23/18 2015  BP: (!) 214/98 (!) 210/102 (!) 212/104 (!) 219/105  Pulse: 63 60 (!) 58   Resp: (!) 23 (!) 23 18 (!) 22  Temp:      TempSrc:      SpO2: 96% 95% 97%   Weight:      Height:        Physical Exam  Constitutional: He is oriented to person, place, and time. He appears well-developed and well-nourished. No distress.  HENT:  Head: Normocephalic.  Mouth/Throat: Oropharynx is clear and moist.  Eyes: Pupils are equal, round, and reactive to light.  Neck: Neck supple. No tracheal deviation present.  Cardiovascular: Normal rate, regular rhythm and intact distal pulses.  Pulmonary/Chest: Effort normal and breath sounds normal. No respiratory distress. He has no wheezes. He has no rales.  Abdominal: Soft. Bowel sounds are normal. He exhibits no distension. There is no tenderness. There is no guarding.  Musculoskeletal: He exhibits no edema.  Neurological: He is alert and oriented to person, place, and time. No cranial nerve deficit.  Strength 5 out of 5 in bilateral upper and lower extremities Sensation to light touch intact throughout Speech fluent Tongue midline  Skin: Skin is warm and dry. He is not diaphoretic.     Labs on Admission: I have personally reviewed following labs and imaging studies  CBC: Recent Labs  Lab 06/23/18 1557  WBC 5.0  NEUTROABS 2.9  HGB 12.0*  HCT 41.6  MCV 74.7*  PLT 517*   Basic Metabolic Panel: Recent Labs   Lab 06/23/18 1557  NA 139  K 3.9  CL 108  CO2 26  GLUCOSE 75  BUN 17  CREATININE 1.29*  CALCIUM 8.9   GFR: Estimated Creatinine Clearance: 47.6 mL/min (A) (by C-G formula based on SCr of 1.29 mg/dL (H)). Liver Function Tests: Recent Labs  Lab 06/23/18 1557  AST 19  ALT 13  ALKPHOS 58  BILITOT 0.5  PROT 7.5  ALBUMIN 3.6   No results for input(s): LIPASE, AMYLASE in the last 168 hours. No results for input(s): AMMONIA in the last 168 hours. Coagulation Profile: No results for input(s): INR, PROTIME in the last 168 hours. Cardiac Enzymes: No results for input(s): CKTOTAL, CKMB, CKMBINDEX, TROPONINI  in the last 168 hours. BNP (last 3 results) Recent Labs    09/16/17 1637  PROBNP 182.0*   HbA1C: No results for input(s): HGBA1C in the last 72 hours. CBG: No results for input(s): GLUCAP in the last 168 hours. Lipid Profile: No results for input(s): CHOL, HDL, LDLCALC, TRIG, CHOLHDL, LDLDIRECT in the last 72 hours. Thyroid Function Tests: No results for input(s): TSH, T4TOTAL, FREET4, T3FREE, THYROIDAB in the last 72 hours. Anemia Panel: No results for input(s): VITAMINB12, FOLATE, FERRITIN, TIBC, IRON, RETICCTPCT in the last 72 hours. Urine analysis:    Component Value Date/Time   COLORURINE YELLOW 01/27/2018 [place] 01/27/2018 1523   LABSPEC 1.025 01/27/2018 1523   PHURINE 6.0 01/27/2018 1523   GLUCOSEU NEGATIVE 01/27/2018 1523   HGBUR TRACE-INTACT (A) 01/27/2018 1523   BILIRUBINUR NEGATIVE 01/27/2018 1523   KETONESUR TRACE (A) 01/27/2018 1523   PROTEINUR 100 (A) 04/03/2017 2108   UROBILINOGEN 0.2 01/27/2018 1523   NITRITE NEGATIVE 01/27/2018 1523   LEUKOCYTESUR NEGATIVE 01/27/2018 1523    Radiological Exams on Admission: No results found.  EKG: Independently reviewed.  Sinus rhythm, LVH, T wave inversions in V5 and V6 (similar to prior EKG from April 04, 2017).  Assessment/Plan Principal Problem:   Hypertensive urgency Active  Problems:   BPH (benign prostatic hyperplasia)   CAD (coronary artery disease)   Chronic anemia   AF (paroxysmal atrial fibrillation) (HCC)   HLD (hyperlipidemia)   CVA (cerebral vascular accident) ([place])   Thrombocytopenia ([place])   Neuropathy   Hypertensive urgency Patient is asymptomatic.  Blood pressure 261/131 on arrival.  I-STAT troponin negative.  EKG with evidence of LVH and T wave inversions in V5 and V6 (similar to prior EKG from April 04, 2017).  Patient received clonidine 0.2 mg once and IV labetalol 20 mg once in the ED. blood pressure continued to be elevated and he was started on labetalol drip. -Continue labetalol drip  -Monitor in the stepdown unit -Care management consult for medication assistance  Chronic anemia -Hemoglobin 12.0, stable.  Ferritin 53 in June 2019. -Continue home iron supplement  Chronic thrombocytopenia -Platelet count 141,000.  No signs of active bleeding.   Hyperlipidemia -Continue home Lipitor  CAD -Continue aspirin, beta-blocker, statin  Prior CVA -Continue home aspirin  Paroxysmal A. Fib -CHA2DS2VAsc 7.  Continue Eliquis for anticoagulation.  Currently on labetalol drip for hypertensive urgency.  Neuropathy -Continue home gabapentin  BPH -Continue home Flomax   DVT prophylaxis: Eliquis Code Status: Patient wishes to be full code. Family Communication: No family available. Disposition Plan: Anticipate discharge to home in 1 day. Consults called: None Admission status: Observation  Shela Leff MD [facility] Pager [phone]  If 7PM-7AM, please contact night-coverage [email or web address] Password [facility]  06/23/2018, 8:46 PM

## 2018-06-23 NOTE — ED Notes (Signed)
EDP aware pt BP remains elevated

## 2018-06-23 NOTE — Progress Notes (Signed)
ANTICOAGULATION CONSULT NOTE - Initial Consult  Pharmacy Consult for Apixaban Indication: atrial fibrillation  No Known Allergies  Patient Measurements: Height: _0  (167.6 cm) Weight: 170 lb (77.1 kg) IBW/kg (Calculated) : 63.8  Vital Signs: Temp: 98 F (36.7 C) (11/21 1553) Temp Source: Oral (11/21 1553) BP: 240/113 (11/21 1815) Pulse Rate: 67 (11/21 1645)  Labs: Recent Labs    06/23/18 1557  HGB 12.0*  HCT 41.6  PLT 141*  CREATININE 1.29*    Estimated Creatinine Clearance: 47.6 mL/min (A) (by C-G formula based on SCr of 1.29 mg/dL (H)).   Medical History: Past Medical History:  Diagnosis Date  . Anemia, iron deficiency 08/15/2011  . CAD (coronary artery disease)    cath in 1/08: EF 60%, mild dilated Ao root, oD2 30%, LAD 20-30%.  . 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 Dyslipidemia   . Dysrhythmia   . History of MI (myocardial infarction) 08/15/2011  . History of pneumonia 08/15/2011  . History of stroke   . Hyperlipidemia   . Hypertension    echocardiogram 6/10: Moderate LVH, EF 55-65%, mild, mild MR, MAC, mild LAE, PASP 32  . Myocardial infarction ([place])   . Paroxysmal atrial fibrillation (HCC)   . Prediabetes 10/08/2015  . Preoperative cardiovascular examination 10/09/2015  . Shortness of breath dyspnea   . Stroke ([place]) 10/2011  . 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 Pharmacy has been consulted for apixaban dosing. PTA patient taking apixaban 5 mg PO BID. Last dose approximately one week ago.  Currently, HgB wnl and PLT 141. Patient noted to have history of thrombocytopenia.    Plan: Apixaban 5 mg PO BID Monitor for s/sx of bleed  Willia Craze, Pharmacy Student

## 2018-06-23 NOTE — Progress Notes (Signed)
Patient's BP 201/82, patient is complaining of some pressure on his head.  Dr. Marlowe Sax has been notified.  I will keep monitoring patient.

## 2018-06-23 NOTE — Patient Instructions (Signed)
To er

## 2018-06-23 NOTE — ED Provider Notes (Addendum)
[facility] EMERGENCY DEPARTMENT Provider Note   CSN: [id number] Arrival date & time: 06/23/18  1539     History   Chief Complaint Chief Complaint  Patient presents with  . Hypertension    HPI KHALIK PEWITT is a 77 y.o. male.  Patient is a 77 year old male with history of hypertension, coronary artery disease, prior CVA, paroxysmal A. fib, who has been out of his medications for the past month.  He was sent from his doctor's office for evaluation of elevated blood pressure and dizziness.  He reports feeling lightheaded with ambulation.  He denies any chest pain or difficulty breathing.  He denies any fevers or chills.  The history is provided by the patient.    Past Medical History:  Diagnosis Date  . Anemia, iron deficiency 08/15/2011  . CAD (coronary artery disease)    cath in 1/08: EF 60%, mild dilated Ao root, oD2 30%, LAD 20-30%.  . Dilated aortic root ([place]) 08/15/2011  . Dyslipidemia   . Dysrhythmia   . History of MI (myocardial infarction) 08/15/2011  . History of pneumonia 08/15/2011  . History of stroke   . Hyperlipidemia   . Hypertension    echocardiogram 6/10: Moderate LVH, EF 55-65%, mild, mild MR, MAC, mild LAE, PASP 32  . Myocardial infarction ([place])   . Paroxysmal atrial fibrillation (HCC)   . Prediabetes 10/08/2015  . Preoperative cardiovascular examination 10/09/2015  . Shortness of breath dyspnea   . Stroke ([place]) 10/2011  . Systolic congestive heart failure ([place]) 01/13/2017    Patient Active Problem List   Diagnosis Date Noted  . Vitamin D deficiency 01/27/2018  . Chest pain 09/18/2017  . RLS (restless legs syndrome) 09/16/2017  . Chronic combined systolic and diastolic CHF (congestive heart failure) ([place]) 04/27/2017  . 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 Cardiomegaly 04/03/2017  . History of stroke 04/03/2017  . Hypertensive heart disease with congestive heart failure ([place]) 04/03/2017   . Gait disorder 01/28/2017  . Acute on chronic congestive heart failure ([place]) 01/13/2017  . Insomnia 12/06/2016  . Left-sided weakness 12/03/2016  . Chronic anticoagulation 12/10/2015  . Sepsis secondary to UTI ([place]) 11/05/2015  . Lower urinary tract infectious disease 11/05/2015  . CAD in native artery 11/05/2015  . 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 Sinus tachycardia   . 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 Labile blood pressure   . Right-sided cerebrovascular accident (CVA) ([place])   . Paroxysmal atrial fibrillation (HCC)   . Carotid artery stenosis   . S/P carotid endarterectomy   . Coronary artery disease involving native coronary artery of native heart without angina pectoris   . Acute blood loss anemia   . Near syncope   . Prediabetes 10/08/2015  . Right cavernous carotid stenosis   . Carotid stenosis   . Acute ischemic stroke ([place]) 10/07/2015  . Acute chest pain 06/19/2015  . DOE (dyspnea on exertion) 06/19/2015  . Hypertensive urgency 06/19/2015  . Bilateral arm pain 06/26/2014  . 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 Impingement syndrome of left shoulder 01/20/2013  . Atrial fibrillation ([place]) 04/18/2012  . Leg pain 01/28/2012  . Dizziness and giddiness 11/19/2011  . Dehydration 11/19/2011  . CKD (chronic kidney disease), stage III ([place]) 11/19/2011  . HTN (hypertension) 11/19/2011  . Dyslipidemia 11/19/2011  . Erectile dysfunction 08/20/2011  . Nocturia 08/20/2011  . Encounter for preventative adult health care exam  with abnormal findings 08/15/2011  . Anemia, iron deficiency 08/15/2011  . History of pneumonia 08/15/2011  . CAD (coronary artery disease) 08/15/2011  . History of MI (myocardial infarction) 08/15/2011  . Dilated aortic root ([place]) 08/15/2011  . Syncope 07/16/2011  . Claudication ([place]) 07/16/2011  . BPH (benign prostatic hyperplasia) 07/16/2011  . Rash 04/29/2011  . Cerebral infarction ([place])  03/31/2011  . Inguinal hernia 03/31/2011    Past Surgical History:  Procedure Laterality Date  . CARDIAC CATHETERIZATION    . COLONOSCOPY    . ENDARTERECTOMY Right 10/10/2015   Procedure: ENDARTERECTOMY CAROTID-RIGHT;  Surgeon: Serafina Mitchell, MD;  [facility];  Service: Vascular;  Laterality: Right;  . IR RADIOLOGIST EVAL & MGMT  02/11/2017  . IR RADIOLOGIST EVAL & MGMT  03/24/2017        Home Medications    Prior to Admission medications   Medication Sig Start Date End Date Taking? Authorizing Provider  apixaban (ELIQUIS) 5 MG TABS tablet Take 1 tablet (5 mg total) by mouth 2 (two) times daily. 01/27/18   Biagio Borg, MD  aspirin EC 81 MG tablet Take 1 tablet (81 mg total) by mouth daily. 04/06/17   Rosita Fire, MD  atorvastatin (LIPITOR) 80 MG tablet Take 1 tablet (80 mg total) by mouth daily. 01/27/18   Biagio Borg, MD  bismuth-metronidazole-tetracycline [facility]) [phone] MG capsule Take 3 capsules by mouth 4 (four) times daily -  before meals and at bedtime. 04/27/18   Ladene Artist, MD  carvedilol (COREG) 25 MG tablet Take 1 tablet (25 mg total) by mouth 2 (two) times daily. 01/27/18 04/27/18  Biagio Borg, MD  ferrous sulfate 325 (65 FE) MG tablet Take 1 tablet (325 mg total) by mouth 2 (two) times daily with a meal. 11/10/17   Ladene Artist, MD  furosemide (LASIX) 20 MG tablet TAKE 1 TABLET BY MOUTH ONCE DAILY 01/28/18   Biagio Borg, MD  gabapentin (NEURONTIN) 300 MG capsule Take 1 capsule (300 mg total) by mouth at bedtime. 01/27/18   Biagio Borg, MD  isosorbide mononitrate (IMDUR) 60 MG 24 hr tablet Take 1 tablet (60 mg total) by mouth daily. 01/27/18   Biagio Borg, MD  lisinopril (PRINIVIL,ZESTRIL) 2.5 MG tablet Take 1 tablet (2.5 mg total) by mouth daily. 01/27/18   Biagio Borg, MD  omeprazole (PRILOSEC) 20 MG capsule Take 1 capsule (20 mg total) by mouth 2 (two) times daily for 14 days. 04/27/18 05/11/18  Ladene Artist, MD  potassium chloride SA  (K-DUR,KLOR-CON) 20 MEQ tablet Take 2 tablets (40 mEq total) by mouth daily. 01/27/18   Biagio Borg, MD  PROAIR RESPICLICK [phone] Base) MCG/ACT AEPB Inhale 2 puffs into the lungs every 6 (six) hours as needed (for wheezing or shortness of breath). 01/27/18   Biagio Borg, MD  tamsulosin (FLOMAX) 0.4 MG CAPS capsule Take 1 capsule (0.4 mg total) by mouth daily after supper. 01/27/18   Biagio Borg, MD    Family History Family History  Problem Relation Age of Onset  . Aneurysm Sister   . Colon cancer Neg Hx     Social History Social History   Tobacco Use  . Smoking status: Never Smoker  . Smokeless tobacco: Never Used  Substance Use Topics  . Alcohol use: No  . Drug use: No     Allergies   Patient has no known allergies.   Review of Systems Review of Systems  All other systems reviewed and are negative.    Physical Exam Updated Vital Signs BP (!) 261/131 (BP Location: Right Arm)   Pulse 80   Temp 98 F (36.7 C) (Oral)   Resp (!) 23   Ht 5' 6"  (1.676 m)   Wt 77.1 kg   SpO2 99%   BMI 27.44 kg/m   Physical Exam  Constitutional: He is oriented to person, place, and time. He appears well-developed and well-nourished. No distress.  HENT:  Head: Normocephalic and atraumatic.  Mouth/Throat: Oropharynx is clear and moist.  Eyes: Pupils are equal, round, and reactive to light. EOM are normal.  Neck: Normal range of motion. Neck supple.  Cardiovascular: Normal rate and regular rhythm. Exam reveals no friction rub.  No murmur heard. Pulmonary/Chest: Effort normal and breath sounds normal. No respiratory distress. He has no wheezes. He has no rales.  Abdominal: Soft. Bowel sounds are normal. He exhibits no distension. There is no tenderness.  Musculoskeletal: Normal range of motion. He exhibits no edema.  Neurological: He is alert and oriented to person, place, and time. No cranial nerve deficit. He exhibits normal muscle tone. Coordination normal.  Skin: Skin is warm  and dry. He is not diaphoretic.  Nursing note and vitals reviewed.    ED Treatments / Results  Labs (all labs ordered are listed, but only abnormal results are displayed) Labs Reviewed - No data to display  EKG EKG Interpretation  Date/Time:  Thursday June 23 2018 15:50:41 EST Ventricular Rate:  79 PR Interval:    QRS Duration: 97 QT Interval:  406 QTC Calculation: 466 R Axis:   -13 Text Interpretation:  Sinus rhythm Prolonged PR interval LVH with secondary repolarization abnormality Confirmed by Veryl Speak [phone]) on 06/23/2018 4:03:19 PM   Radiology No results found.  Procedures Procedures (including critical care time)  Medications Ordered in ED Medications - No data to display   Initial Impression / Assessment and Plan / ED Course  I have reviewed the triage vital signs and the nursing notes.  Pertinent labs & imaging results that were available during my care of the patient were reviewed by me and considered in my medical decision making (see chart for details).  Patient sent from his primary doctor's office for evaluation of elevated blood pressure.  His blood pressures have been markedly elevated while in the emergency department.  He was given IV labetalol along with p.o. clonidine, however blood pressures have remained elevated.  His physical examination and laboratory studies show no signs of endorgan damage.  Patient will be started on a labetalol drip and admitted to the hospitalist service for management of his blood pressure.  CRITICAL CARE Performed by: Veryl Speak Total critical care time: 35 minutes Critical care time was exclusive of separately billable procedures and treating other patients. Critical care was necessary to treat or prevent imminent or life-threatening deterioration. Critical care was time spent personally by me on the following activities: development of treatment plan with patient and/or surrogate as well as nursing,  discussions with consultants, evaluation of patient's response to treatment, examination of patient, obtaining history from patient or surrogate, ordering and performing treatments and interventions, ordering and review of laboratory studies, ordering and review of radiographic studies, pulse oximetry and re-evaluation of patient's condition.   Final Clinical Impressions(s) / ED Diagnoses   Final diagnoses:  None    ED Discharge Orders    None       Veryl Speak, MD 06/23/18 1905  Veryl Speak, MD 06/23/18 [phone]

## 2018-06-23 NOTE — ED Triage Notes (Signed)
Pt bib EMS with reports of hypertension. Pt with hx CVA, MI, CHF, Paroxymal Afib, out of medications for approx 3 weeks. Pt states his head feels "off". Denies headache. No neuro deficits noted with EMS. 138/137, HR 70's, RR 20, 98% RA. 20g LAC.

## 2018-06-23 NOTE — Progress Notes (Signed)
[name] is a 77 y.o. male with the following history as recorded in EpicCare:  Patient Active Problem List   Diagnosis Date Noted  . Vitamin D deficiency 01/27/2018  . Chest pain 09/18/2017  . RLS (restless legs syndrome) 09/16/2017  . Chronic combined systolic and diastolic CHF (congestive heart failure) ([place]) 04/27/2017  . 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 Cardiomegaly 04/03/2017  . History of stroke 04/03/2017  . Hypertensive heart disease with congestive heart failure ([place]) 04/03/2017  . Gait disorder 01/28/2017  . Acute on chronic congestive heart failure ([place]) 01/13/2017  . Insomnia 12/06/2016  . Left-sided weakness 12/03/2016  . Chronic anticoagulation 12/10/2015  . Sepsis secondary to UTI ([place]) 11/05/2015  . Lower urinary tract infectious disease 11/05/2015  . CAD in native artery 11/05/2015  . Left hemiplegia ([place]) 11/05/2015  . Sinus tachycardia   . 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 Labile blood pressure   . Right-sided cerebrovascular accident (CVA) ([place])   . Paroxysmal atrial fibrillation (HCC)   . Carotid artery stenosis   . S/P carotid endarterectomy   . Coronary artery disease involving native coronary artery of native heart without angina pectoris   . Acute blood loss anemia   . Near syncope   . Prediabetes 10/08/2015  . Right cavernous carotid stenosis   . Carotid stenosis   . Acute ischemic stroke ([place]) 10/07/2015  . Acute chest pain 06/19/2015  . DOE (dyspnea on exertion) 06/19/2015  . Hypertensive urgency 06/19/2015  . Bilateral arm pain 06/26/2014  . 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 Impingement syndrome of left shoulder 01/20/2013  . Atrial fibrillation ([place]) 04/18/2012  . Leg pain 01/28/2012  . Dizziness and giddiness 11/19/2011  . Dehydration 11/19/2011  . CKD (chronic kidney disease), stage III ([place]) 11/19/2011  . HTN (hypertension)  11/19/2011  . Dyslipidemia 11/19/2011  . Erectile dysfunction 08/20/2011  . Nocturia 08/20/2011  . Encounter for preventative adult health care exam with abnormal findings 08/15/2011  . Anemia, iron deficiency 08/15/2011  . History of pneumonia 08/15/2011  . CAD (coronary artery disease) 08/15/2011  . History of MI (myocardial infarction) 08/15/2011  . Dilated aortic root ([place]) 08/15/2011  . Syncope 07/16/2011  . Claudication ([place]) 07/16/2011  . BPH (benign prostatic hyperplasia) 07/16/2011  . Rash 04/29/2011  . Cerebral infarction ([place]) 03/31/2011  . Inguinal hernia 03/31/2011    Current Outpatient Medications  Medication Sig Dispense Refill  . apixaban (ELIQUIS) 5 MG TABS tablet Take 1 tablet (5 mg total) by mouth 2 (two) times daily. 180 tablet 3  . aspirin EC 81 MG tablet Take 1 tablet (81 mg total) by mouth daily. 30 tablet 0  . atorvastatin (LIPITOR) 80 MG tablet Take 1 tablet (80 mg total) by mouth daily. 90 tablet 3  . bismuth-metronidazole-tetracycline (PYLERA) 140-125-125 MG capsule Take 3 capsules by mouth 4 (four) times daily -  before meals and at bedtime. 168 capsule 0  . ferrous sulfate 325 (65 FE) MG tablet Take 1 tablet (325 mg total) by mouth 2 (two) times daily with a meal. 60 tablet 11  . furosemide (LASIX) 20 MG tablet TAKE 1 TABLET BY MOUTH ONCE DAILY 180 tablet 3  . gabapentin (NEURONTIN) 300 MG capsule Take 1 capsule (300 mg total) by mouth at bedtime. 90 capsule 1  . isosorbide mononitrate (IMDUR) 60 MG 24 hr tablet Take 1 tablet (60 mg total) by mouth daily.  90 tablet 3  . lisinopril (PRINIVIL,ZESTRIL) 2.5 MG tablet Take 1 tablet (2.5 mg total) by mouth daily. 90 tablet 3  . potassium chloride SA (K-DUR,KLOR-CON) 20 MEQ tablet Take 2 tablets (40 mEq total) by mouth daily. 90 tablet 3  . PROAIR RESPICLICK 211 (90 Base) MCG/ACT AEPB Inhale 2 puffs into the lungs every 6 (six) hours as needed (for wheezing or shortness of breath). 1 each 11  . tamsulosin (FLOMAX)  0.4 MG CAPS capsule Take 1 capsule (0.4 mg total) by mouth daily after supper. 90 capsule 3  . carvedilol (COREG) 25 MG tablet Take 1 tablet (25 mg total) by mouth 2 (two) times daily. 180 tablet 3  . omeprazole (PRILOSEC) 20 MG capsule Take 1 capsule (20 mg total) by mouth 2 (two) times daily for 14 days. 28 capsule 0   No current facility-administered medications for this visit.     Allergies: Patient has no known allergies.  Past Medical History:  Diagnosis Date  . Anemia, iron deficiency 08/15/2011  . CAD (coronary artery disease)    cath in 1/08: EF 60%, mild dilated Ao root, oD2 30%, LAD 20-30%.  . Dilated aortic root ([place]) 08/15/2011  . Dyslipidemia   . Dysrhythmia   . History of MI (myocardial infarction) 08/15/2011  . History of pneumonia 08/15/2011  . History of stroke   . Hyperlipidemia   . Hypertension    echocardiogram 6/10: Moderate LVH, EF 55-65%, mild, mild Craig, MAC, mild LAE, PASP 32  . Myocardial infarction ([place])   . Paroxysmal atrial fibrillation (HCC)   . Prediabetes 10/08/2015  . Preoperative cardiovascular examination 10/09/2015  . Shortness of breath dyspnea   . Stroke ([place]) 10/2011  . Systolic congestive heart failure ([place]) 01/13/2017    Past Surgical History:  Procedure Laterality Date  . CARDIAC CATHETERIZATION    . COLONOSCOPY    . ENDARTERECTOMY Right 10/10/2015   Procedure: ENDARTERECTOMY CAROTID-RIGHT;  Surgeon: Craig Mitchell, MD;  Location: [facility] OR;  Service: Vascular;  Laterality: Right;  . IR RADIOLOGIST EVAL & MGMT  02/11/2017  . IR RADIOLOGIST EVAL & MGMT  03/24/2017    Family History  Problem Relation Age of Onset  . Aneurysm Sister   . Colon cancer Neg Hx     Social History   Tobacco Use  . Smoking status: Never Smoker  . Smokeless tobacco: Never Used  Substance Use Topics  . Alcohol use: No     Subjective:  [name] is here today requesting evaluation of dizziness, "funny feeling in his head" which first began around last week, symptoms  have been intermittent since onset. He's also noticed that he feels his speech has been slurred at times, and has noticed urinary frequency. He denies fevers, chills, syncope, blurred vision, chest pain, shortness of breath, abdominal pain, nausea, vomiting, diarrhea, edema. He says he has run out of his blood pressure medications, last doses last week, he does not have any money to pick up any new medications today   BP Readings from Last 3 Encounters:  06/23/18 (!) 210/100  04/12/18 (!) 194/104  01/27/18 (!) 162/94    ROS- See HPI  Objective:  Vitals:   06/23/18 1357 06/23/18 1428  BP: (!) 240/102 (!) 210/100  Pulse: 71   SpO2: 98%   Weight: 170 lb (77.1 kg)   Height: 5' 6"  (1.676 m)     General: Well developed, well nourished, in no acute distress  Skin : Warm and dry.  Head: Normocephalic and  atraumatic  Eyes: Sclera and conjunctiva clear; pupils round and reactive to light; extraocular movements intact  Ears: External normal; canals clear; tympanic membranes normal  Oropharynx: Pink, supple. No suspicious lesions  Neck: Supple  Lungs: Respirations unlabored; clear to auscultation bilaterally CVS exam: normal rate and regular rhythm, S1 and S2 normal.  Musculoskeletal: No deformities  Extremities: No edema, cyanosis, clubbing  Vessels: Symmetric bilaterally  Neurologic: Alert and oriented; speech intact;  moves all extremities well; CNII-XII intact without focal deficit  Psychiatric: Normal mood and affect.  Assessment:  1. Essential hypertension   2. Dizziness   3. Slurred speech     Plan:  Due to elevated blood pressure, symptoms, and inability to resume medications today, I am sending him to the ER for further evaluation Ambulance called to transport patient  No follow-ups on file.  No orders of the defined types were placed in this encounter.   Requested Prescriptions    No prescriptions requested or ordered in this encounter

## 2018-06-24 ENCOUNTER — Other Ambulatory Visit: Payer: Self-pay

## 2018-06-24 DIAGNOSIS — I480 Paroxysmal atrial fibrillation: Secondary | ICD-10-CM

## 2018-06-24 DIAGNOSIS — I160 Hypertensive urgency: Secondary | ICD-10-CM | POA: Diagnosis present

## 2018-06-24 DIAGNOSIS — Z7982 Long term (current) use of aspirin: Secondary | ICD-10-CM | POA: Diagnosis not present

## 2018-06-24 DIAGNOSIS — E785 Hyperlipidemia, unspecified: Secondary | ICD-10-CM | POA: Diagnosis present

## 2018-06-24 DIAGNOSIS — N400 Enlarged prostate without lower urinary tract symptoms: Secondary | ICD-10-CM | POA: Diagnosis not present

## 2018-06-24 DIAGNOSIS — I5043 Acute on chronic combined systolic (congestive) and diastolic (congestive) heart failure: Secondary | ICD-10-CM | POA: Diagnosis not present

## 2018-06-24 DIAGNOSIS — I110 Hypertensive heart disease with heart failure: Secondary | ICD-10-CM | POA: Diagnosis present

## 2018-06-24 DIAGNOSIS — I252 Old myocardial infarction: Secondary | ICD-10-CM | POA: Diagnosis not present

## 2018-06-24 DIAGNOSIS — Z8673 Personal history of transient ischemic attack (TIA), and cerebral infarction without residual deficits: Secondary | ICD-10-CM | POA: Diagnosis not present

## 2018-06-24 DIAGNOSIS — I2510 Atherosclerotic heart disease of native coronary artery without angina pectoris: Secondary | ICD-10-CM | POA: Diagnosis present

## 2018-06-24 DIAGNOSIS — D696 Thrombocytopenia, unspecified: Secondary | ICD-10-CM | POA: Diagnosis not present

## 2018-06-24 DIAGNOSIS — I639 Cerebral infarction, unspecified: Secondary | ICD-10-CM | POA: Diagnosis present

## 2018-06-24 DIAGNOSIS — Z8679 Personal history of other diseases of the circulatory system: Secondary | ICD-10-CM | POA: Diagnosis not present

## 2018-06-24 DIAGNOSIS — D649 Anemia, unspecified: Secondary | ICD-10-CM | POA: Diagnosis not present

## 2018-06-24 DIAGNOSIS — Z79899 Other long term (current) drug therapy: Secondary | ICD-10-CM | POA: Diagnosis not present

## 2018-06-24 DIAGNOSIS — Z7901 Long term (current) use of anticoagulants: Secondary | ICD-10-CM | POA: Diagnosis not present

## 2018-06-24 DIAGNOSIS — D509 Iron deficiency anemia, unspecified: Secondary | ICD-10-CM | POA: Diagnosis present

## 2018-06-24 DIAGNOSIS — Z9114 Patient's other noncompliance with medication regimen: Secondary | ICD-10-CM | POA: Diagnosis not present

## 2018-06-24 DIAGNOSIS — G629 Polyneuropathy, unspecified: Secondary | ICD-10-CM | POA: Diagnosis present

## 2018-06-24 DIAGNOSIS — I169 Hypertensive crisis, unspecified: Secondary | ICD-10-CM | POA: Diagnosis not present

## 2018-06-24 LAB — MRSA PCR SCREENING: MRSA by PCR: POSITIVE — AB

## 2018-06-24 LAB — BRAIN NATRIURETIC PEPTIDE: B NATRIURETIC PEPTIDE 5: 320.2 pg/mL — AB (ref 0.0–100.0)

## 2018-06-24 MED ORDER — CHLORHEXIDINE GLUCONATE CLOTH 2 % EX PADS
6.0000 | MEDICATED_PAD | Freq: Every day | CUTANEOUS | Status: DC
  Administered 2018-06-24 – 2018-06-25 (×2): 6 via TOPICAL

## 2018-06-24 MED ORDER — MUPIROCIN 2 % EX OINT
1.0000 "application " | TOPICAL_OINTMENT | Freq: Two times a day (BID) | CUTANEOUS | Status: DC
  Administered 2018-06-24 – 2018-06-25 (×4): 1 via NASAL
  Filled 2018-06-24 (×2): qty 22

## 2018-06-24 MED ORDER — FUROSEMIDE 10 MG/ML IJ SOLN
20.0000 mg | Freq: Two times a day (BID) | INTRAMUSCULAR | Status: DC
  Administered 2018-06-24 – 2018-06-26 (×4): 20 mg via INTRAVENOUS
  Filled 2018-06-24 (×4): qty 2

## 2018-06-24 MED ORDER — HYDRALAZINE HCL 20 MG/ML IJ SOLN
10.0000 mg | Freq: Four times a day (QID) | INTRAMUSCULAR | Status: DC | PRN
  Administered 2018-06-24 (×2): 10 mg via INTRAVENOUS
  Filled 2018-06-24 (×3): qty 1

## 2018-06-24 NOTE — Consult Note (Addendum)
   [facility] CM Inpatient Consult   06/24/2018  [name] May 09, 1941 [id number]    Referral received from inpatient [facility] for [place] Management program due to having issues with affording his medications.  Went to bedside to speak with [name] about [place] Management program services. He is agreeable to [place], [facility] Pharmacist, and [facility] Social Worker follow up. [facility] Care Management written consent obtained and [facility] folder provided.  [name] endorses that he has concerns with affording his medications. Therefore, he does not takes his medications regularly.  [name] states he lives alone.  He also states he still drives but he still needs to pay taxes on his truck so he is not currently driving. Therefore, he states transportation to MD appointments poses a problem for him at times.  Confirmed Primary Care Provider is Dr. [name]. Shelba Flake listed as doing transition of care call post discharge.  Made [name] aware that [place] Management does not assist with open enrollment. However, Probation officer provided [employer] [place] telephone number [phone] to contact for assistance. Advised [name] to press "0" for the receptionist and ask to speak with the [facility] counselor or tell them that he needs to speak with someone about Medicare enrollment assistance. Writer wrote all this information down for patient's records. He states he will follow up.  Confirmed best contact number for [name] as [phone].   [name] also asked writer to discuss the above with his "friend" [name]. He also listed [name] as an emergency contact on [facility] consent as [phone].  Will make referral to [facility] for complex case management and CHF, [facility] Pharmacist for medication affordability, [facility] Social Worker for transportation assistance.   Made inpatient RNCM aware of above notes.   Craig Rolling, MSN-Ed, RN,BSN [facility]  Liaison [phone]

## 2018-06-24 NOTE — Care Management Note (Signed)
Case Management Note  Patient Details  Name: [name] MRN: [id number] Date of Birth: 1941/01/14  Subjective/Objective: Pt presented for hypertension urgency. PTA from home alone. Patient has support of children. Pt has [place] and still drives- not homebound. Patient states he has part A&D no part B. Patient states he needs assistance with insurance since open enrollment. CM received consult for [facility] needs and noncompliance with medications.                   Action/Plan: CM did make [facility] referral for assistance with medications. [name] to consult with patient in the community. [facility] will provide community resources for open enrollment assistance. No further needs from CM @ this time.   Expected Discharge Date:                  Expected Discharge Plan:  Home/Self Care  In-House Referral:  [facility]  Discharge planning Services  CM Consult, Homebound not met per provider  Post Acute Care Choice:  NA Choice offered to:  NA  DME Arranged:  N/A DME Agency:  NA  HH Arranged:  NA HH Agency:  NA  Status of Service:  Completed, signed off  If discussed at [place] of Stay Meetings, dates discussed:    Additional Comments:  Craig Roys, RN 06/24/2018, 1:02 PM

## 2018-06-24 NOTE — Discharge Instructions (Addendum)
Information on my medicine - XARELTO (Rivaroxaban)  This medication education was reviewed with me or my healthcare representative as part of my discharge preparation.  Why was Xarelto prescribed for you? Xarelto was prescribed for you to reduce the risk of a blood clot forming that can cause a stroke if you have a medical condition called atrial fibrillation (a type of irregular heartbeat).  What do you need to know about xarelto ? Take your Xarelto ONCE DAILY at the same time every day with your evening meal. If you have difficulty swallowing the tablet whole, you may crush it and mix in applesauce just prior to taking your dose.  Take Xarelto exactly as prescribed by your doctor and DO NOT stop taking Xarelto without talking to the doctor who prescribed the medication.  Stopping without other stroke prevention medication to take the place of Xarelto may increase your risk of developing a clot that causes a stroke.  Refill your prescription before you run out.  After discharge, you should have regular check-up appointments with your healthcare provider that is prescribing your Xarelto.  In the future your dose may need to be changed if your kidney function or weight changes by a significant amount.  What do you do if you miss a dose? If you are taking Xarelto ONCE DAILY and you miss a dose, take it as soon as you remember on the same day then continue your regularly scheduled once daily regimen the next day. Do not take two doses of Xarelto at the same time or on the same day.   Important Safety Information A possible side effect of Xarelto is bleeding. You should call your healthcare provider right away if you experience any of the following: ? Bleeding from an injury or your nose that does not stop. ? Unusual colored urine (red or dark brown) or unusual colored stools (red or black). ? Unusual bruising for unknown reasons. ? A serious fall or if you hit your head (even if there  is no bleeding).  Some medicines may interact with Xarelto and might increase your risk of bleeding while on Xarelto. To help avoid this, consult your healthcare provider or pharmacist prior to using any new prescription or non-prescription medications, including herbals, vitamins, non-steroidal anti-inflammatory drugs (NSAIDs) and supplements.  This website has more information on Xarelto: [email or web address]   ========================================================================================== For medication assistance, please call [employer] [facility] telephone number [phone] to contact for assistance. Press "0" for the receptionist and ask to speak with the [facility] counselor

## 2018-06-24 NOTE — Progress Notes (Signed)
PROGRESS NOTE  [name] RDE:[id number] DOB: 07/06/1941 DOA: 06/23/2018 PCP: Biagio Borg, MD  HPI/Recap of past 9 hours: 77 year old male with past medical history of CAD status post MI, systolic and diastolic CHF, previous CVA and paroxysmal A. fib who presented to his cardiologist with complaints of dizziness and episodes of slurred speech who conveyed that his blood pressure medications had run out a week prior and he did not have any money to get new ones.  We blood pressure at that time was recorded to be 210/100.  Patient referred over to the emergency room.  In the emergency room, blood pressure even higher at 261/131.  EKG unchanged and i-STAT troponin negative.  Patient given clonidine and IV labetalol and placed on labetalol drip and admitted to the hospitalist service in the stepdown unit.  He was also restarted on his Eliquis.  By following day, patient's blood pressure somewhat better with a systolic in the 631S.  BNP checked and found to be elevated at 320.  Patient started on IV Lasix.  Today, patient states he is feeling better.  He at times seems paranoid slightly.   Assessment/Plan: Principal Problem:   Acute on chronic combined systolic and diastolic congestive heart failure ([place]): Mild.  IV Lasix. Active Problems:   BPH (benign prostatic hyperplasia)   CAD (coronary artery disease) with history of hyperlipidemia: We will check fasting lipid profile in the morning.    Chronic anemia: Stable.    Hypertensive urgency: Secondary to not being on his medications for several days.   AF (paroxysmal atrial fibrillation) ([place]): Rate controlled.  Chads 2 score of 5.  Restarted on Eliquis.  I called his pharmacy and found that his co-pay is $45.  Will discuss with patient tomorrow as his total cost for medications should be under $60 a month.  This is too much, we need to look into transitioning him to Coumadin although I am concerned about follow-up and weekly labs.    CVA  (cerebral vascular accident) [facility]): Stable at this time.   Thrombocytopenia ([place]): Mild.  Stable.  Recheck labs in the morning.  In review of patient's labs, he previously has had this before Paroxysmal A. fib: Rate controlled, needs to stay on Eliquis.   Code Status: Full code  Family Communication: No family.  Left message for patient's friend listed on medical record  Disposition Plan: Potential discharge in the next 1-2 days once fully diuresed and medication plan in place   Consultants:  None  Procedures:  None  Antimicrobials:  None  DVT prophylaxis: Eliquis   Objective: Vitals:   06/24/18 0840 06/24/18 1247  BP:  (!) 155/79  Pulse:    Resp:    Temp: (!) 97.5 F (36.4 C) 98 F (36.7 C)  SpO2:  98%    Intake/Output Summary (Last 24 hours) at 06/24/2018 1819 Last data filed at 06/24/2018 0816 Gross per 24 hour  Intake 550 ml  Output 150 ml  Net 400 ml   Filed Weights   06/23/18 1553 06/23/18 2132 06/24/18 0500  Weight: 77.1 kg 72.8 kg 72.8 kg   Body mass index is 25.53 kg/m.  Exam:   General: Alert and oriented x2, no acute distress  HEENT: Normocephalic and atraumatic, mucous members are moist  Neck: Supple, no JVD  Cardiovascular: Regular rate and rhythm, S1-S2  Respiratory: Clear to auscultation bilaterally  Abdomen: Soft, nontender, nondistended, positive bowel sounds  Musculoskeletal: No clubbing or cyanosis, trace pitting edema bilaterally  Skin:  Skin breaks, tears or lesions  Psychiatry: Appropriate, although sometimes seems a little paranoid.  No evidence of acute psychosis though   Data Reviewed: CBC: Recent Labs  Lab 06/23/18 1557  WBC 5.0  NEUTROABS 2.9  HGB 12.0*  HCT 41.6  MCV 74.7*  PLT 379*   Basic Metabolic Panel: Recent Labs  Lab 06/23/18 1557  NA 139  K 3.9  CL 108  CO2 26  GLUCOSE 75  BUN 17  CREATININE 1.29*  CALCIUM 8.9   GFR: Estimated Creatinine Clearance: 44.8 mL/min (A) (by C-G formula  based on SCr of 1.29 mg/dL (H)). Liver Function Tests: Recent Labs  Lab 06/23/18 1557  AST 19  ALT 13  ALKPHOS 58  BILITOT 0.5  PROT 7.5  ALBUMIN 3.6   No results for input(s): LIPASE, AMYLASE in the last 168 hours. No results for input(s): AMMONIA in the last 168 hours. Coagulation Profile: No results for input(s): INR, PROTIME in the last 168 hours. Cardiac Enzymes: No results for input(s): CKTOTAL, CKMB, CKMBINDEX, TROPONINI in the last 168 hours. BNP (last 3 results) Recent Labs    09/16/17 1637  PROBNP 182.0*   HbA1C: No results for input(s): HGBA1C in the last 72 hours. CBG: No results for input(s): GLUCAP in the last 168 hours. Lipid Profile: No results for input(s): CHOL, HDL, LDLCALC, TRIG, CHOLHDL, LDLDIRECT in the last 72 hours. Thyroid Function Tests: No results for input(s): TSH, T4TOTAL, FREET4, T3FREE, THYROIDAB in the last 72 hours. Anemia Panel: No results for input(s): VITAMINB12, FOLATE, FERRITIN, TIBC, IRON, RETICCTPCT in the last 72 hours. Urine analysis:    Component Value Date/Time   COLORURINE YELLOW 01/27/2018 [place] 01/27/2018 1523   LABSPEC 1.025 01/27/2018 1523   PHURINE 6.0 01/27/2018 1523   GLUCOSEU NEGATIVE 01/27/2018 1523   HGBUR TRACE-INTACT (A) 01/27/2018 1523   BILIRUBINUR NEGATIVE 01/27/2018 1523   KETONESUR TRACE (A) 01/27/2018 1523   PROTEINUR 100 (A) 04/03/2017 2108   UROBILINOGEN 0.2 01/27/2018 1523   NITRITE NEGATIVE 01/27/2018 1523   LEUKOCYTESUR NEGATIVE 01/27/2018 1523   Sepsis Labs: @LABRCNTIP (procalcitonin:4,lacticidven:4)  ) Recent Results (from the past 240 hour(s))  MRSA PCR Screening     Status: Abnormal   Collection Time: 06/23/18  9:42 PM  Result Value Ref Range Status   MRSA by PCR POSITIVE (A) NEGATIVE Final    Comment:        The GeneXpert MRSA Assay (FDA approved for NASAL specimens only), is one component of a comprehensive MRSA colonization surveillance program. It is  not intended to diagnose MRSA infection nor to guide or monitor treatment for MRSA infections. RESULT CALLED TO, READ BACK BY AND VERIFIED WITH: D.FRANCO,RN AT 0122 06/24/18 BY L.PITT       Studies: No results found.  Scheduled Meds: . apixaban  5 mg Oral BID  . aspirin EC  81 mg Oral Daily  . atorvastatin  80 mg Oral Daily  . Chlorhexidine Gluconate Cloth  6 each Topical Q0600  . ferrous sulfate  325 mg Oral BID WC  . furosemide  20 mg Intravenous BID  . gabapentin  300 mg Oral QHS  . isosorbide mononitrate  60 mg Oral Daily  . mupirocin ointment  1 application Nasal BID  . tamsulosin  0.4 mg Oral QPC supper    Continuous Infusions:   LOS: 0 days     Annita Brod, MD [facility]  To reach me or the doctor on call, go to: [email or web address] Password [email or web address]  06/24/2018, 6:19 PM

## 2018-06-24 NOTE — Care Management Obs Status (Signed)
[place] NOTIFICATION   Patient Details  Name: ZURI BRADWAY MRN: [id number] Date of Birth: [birth year]   Medicare Observation Status Notification Given:  Yes    [name], RN 06/24/2018, 12:18 PM

## 2018-06-25 DIAGNOSIS — N400 Enlarged prostate without lower urinary tract symptoms: Secondary | ICD-10-CM

## 2018-06-25 DIAGNOSIS — I5043 Acute on chronic combined systolic (congestive) and diastolic (congestive) heart failure: Secondary | ICD-10-CM

## 2018-06-25 DIAGNOSIS — D649 Anemia, unspecified: Secondary | ICD-10-CM

## 2018-06-25 LAB — CBC
HEMATOCRIT: 38.9 % — AB (ref 39.0–52.0)
Hemoglobin: 11.7 g/dL — ABNORMAL LOW (ref 13.0–17.0)
MCH: 21.5 pg — AB (ref 26.0–34.0)
MCHC: 30.1 g/dL (ref 30.0–36.0)
MCV: 71.4 fL — AB (ref 80.0–100.0)
NRBC: 0 % (ref 0.0–0.2)
PLATELETS: 137 10*3/uL — AB (ref 150–400)
RBC: 5.45 MIL/uL (ref 4.22–5.81)
RDW: 16.1 % — AB (ref 11.5–15.5)
WBC: 6.1 10*3/uL (ref 4.0–10.5)

## 2018-06-25 LAB — BASIC METABOLIC PANEL
Anion gap: 7 (ref 5–15)
BUN: 19 mg/dL (ref 8–23)
CALCIUM: 8.8 mg/dL — AB (ref 8.9–10.3)
CO2: 24 mmol/L (ref 22–32)
CREATININE: 1.32 mg/dL — AB (ref 0.61–1.24)
Chloride: 103 mmol/L (ref 98–111)
GFR calc non Af Amer: 51 mL/min — ABNORMAL LOW (ref >60)
GFR, EST AFRICAN AMERICAN: 59 mL/min — AB (ref >60)
GLUCOSE: 110 mg/dL — AB (ref 70–99)
Potassium: 4.2 mmol/L (ref 3.5–5.1)
Sodium: 134 mmol/L — ABNORMAL LOW (ref 135–145)

## 2018-06-25 MED ORDER — CARVEDILOL 25 MG PO TABS
25.0000 mg | ORAL_TABLET | Freq: Two times a day (BID) | ORAL | Status: DC
  Administered 2018-06-25 – 2018-06-26 (×4): 25 mg via ORAL
  Filled 2018-06-25 (×4): qty 1

## 2018-06-25 MED ORDER — CLONIDINE HCL 0.2 MG PO TABS
0.2000 mg | ORAL_TABLET | Freq: Once | ORAL | Status: AC
  Administered 2018-06-25: 0.2 mg via ORAL
  Filled 2018-06-25: qty 1

## 2018-06-25 MED ORDER — LISINOPRIL 2.5 MG PO TABS
2.5000 mg | ORAL_TABLET | Freq: Every day | ORAL | Status: DC
  Administered 2018-06-25 – 2018-06-26 (×2): 2.5 mg via ORAL
  Filled 2018-06-25 (×2): qty 1

## 2018-06-25 NOTE — Progress Notes (Signed)
Pt given PRN hydralazine and labetalol during the night and no blood pressure improvement. Last bp 195/92, HR  75, rr 22. Paged MD, got orders. Will continue to monitor patient.

## 2018-06-25 NOTE — Progress Notes (Signed)
PROGRESS NOTE    [name]  DPO:[id number] DOB: [birth year] DOA: 06/23/2018 PCP: Biagio Borg, MD    Brief Narrative:  77 year old male with past medical history of CAD status post MI, systolic and diastolic CHF, previous CVA and paroxysmal A. fib who presented to his cardiologist with complaints of dizziness and episodes of slurred speech who conveyed that his blood pressure medications had run out a week prior and he did not have any money to get new ones.  We blood pressure at that time was recorded to be 210/100.  Patient referred over to the emergency room.  In the emergency room, blood pressure even higher at 261/131.  EKG unchanged and i-STAT troponin negative.  Patient given clonidine and IV labetalol and placed on labetalol drip and admitted to the hospitalist service in the stepdown unit.  He was also restarted on his Eliquis.  By following day, patient's blood pressure somewhat better with a systolic in the 431V.  BNP checked and found to be elevated at 320.  Patient started on IV Lasix.  Today, patient states he is feeling better.  He at times seems paranoid slightly.  Assessment & Plan:   Principal Problem:   Acute on chronic combined systolic and diastolic congestive heart failure (H[facility]) Active Problems:   BPH (benign prostatic hyperplasia)   CAD (coronary artery disease)   Chronic anemia   Hypertensive urgency   AF (paroxysmal atrial fibrillation) ([facility])   HLD (hyperlipidemia)   CVA (cerebral vascular accident) ([place])   Thrombocytopenia ([place])   Neuropathy  Principal Problem:   Acute on chronic combined systolic and diastolic congestive heart failure ([place]):  -Continue IV lasix as tolerated -Cr starting to trend upwards -Continue to monitor I/o . Active Problems:   BPH (benign prostatic hyperplasia) -Patient reports some symptoms suggestive of bladder outlet obstruction -Will check bladder scan, I/O cath if >300cc on scan    CAD (coronary artery disease) with  history of hyperlipidemia:  -no chest pain -Stable at present    Chronic anemia:  -Stable at this time    Hypertensive urgency:  -Secondary to not being on his medications for several days. -Have resumed home meds -Cont to titrate BP meds as tolerated    AF (paroxysmal atrial fibrillation) ([place]):  -Rate controlled.  Chads 2 score of 5.   -Cont on Eliquis as tolerated.  -Dr. Sonda Rumble called patient's pharmacy and found that his co-pay is $45.  patient's total cost for medications should be under $60 a month.    CVA (cerebral vascular accident) [facility]):  -Stable at this time.    Thrombocytopenia ([place]):  -Mild.  Stable.  -No bleeding  DVT prophylaxis: Eliquis Code Status: Full Family Communication: Pt in room, family not at bedside Disposition Plan: Uncertain at this time  Consultants:     Procedures:     Antimicrobials: Anti-infectives (From admission, onward)   None       Subjective: Eager to go home  Objective: Vitals:   06/25/18 0315 06/25/18 0549 06/25/18 0551 06/25/18 1013  BP: (!) 151/85 (!) 161/80  (!) 175/95  Pulse: 73 61  69  Resp:      Temp:  (!) 97.5 F (36.4 C)    TempSrc:  Oral    SpO2: 97% 98%  99%  Weight:   73.1 kg   Height:        Intake/Output Summary (Last 24 hours) at 06/25/2018 1701 Last data filed at 06/25/2018 0606 Gross per 24 hour  Intake -  Output 960 ml  Net -960 ml   Filed Weights   06/23/18 2132 06/24/18 0500 06/25/18 0551  Weight: 72.8 kg 72.8 kg 73.1 kg    Examination: General exam: Awake, laying in bed, in nad Respiratory system: Normal respiratory effort, no wheezing Cardiovascular system: regular rate, s1, s2 Gastrointestinal system: Soft, nondistended, positive BS Central nervous system: CN2-12 grossly intact, strength intact Extremities: Perfused, no clubbing Skin: Normal skin turgor, no notable skin lesions seen Psychiatry: Mood normal // no visual hallucinations   Data Reviewed: I have personally  reviewed following labs and imaging studies  CBC: Recent Labs  Lab 06/23/18 1557 06/25/18 0507  WBC 5.0 6.1  NEUTROABS 2.9  --   HGB 12.0* 11.7*  HCT 41.6 38.9*  MCV 74.7* 71.4*  PLT 141* 191*   Basic Metabolic Panel: Recent Labs  Lab 06/23/18 1557 06/25/18 0507  NA 139 134*  K 3.9 4.2  CL 108 103  CO2 26 24  GLUCOSE 75 110*  BUN 17 19  CREATININE 1.29* 1.32*  CALCIUM 8.9 8.8*   GFR: Estimated Creatinine Clearance: 43.8 mL/min (A) (by C-G formula based on SCr of 1.32 mg/dL (H)). Liver Function Tests: Recent Labs  Lab 06/23/18 1557  AST 19  ALT 13  ALKPHOS 58  BILITOT 0.5  PROT 7.5  ALBUMIN 3.6   No results for input(s): LIPASE, AMYLASE in the last 168 hours. No results for input(s): AMMONIA in the last 168 hours. Coagulation Profile: No results for input(s): INR, PROTIME in the last 168 hours. Cardiac Enzymes: No results for input(s): CKTOTAL, CKMB, CKMBINDEX, TROPONINI in the last 168 hours. BNP (last 3 results) Recent Labs    09/16/17 1637  PROBNP 182.0*   HbA1C: No results for input(s): HGBA1C in the last 72 hours. CBG: No results for input(s): GLUCAP in the last 168 hours. Lipid Profile: No results for input(s): CHOL, HDL, LDLCALC, TRIG, CHOLHDL, LDLDIRECT in the last 72 hours. Thyroid Function Tests: No results for input(s): TSH, T4TOTAL, FREET4, T3FREE, THYROIDAB in the last 72 hours. Anemia Panel: No results for input(s): VITAMINB12, FOLATE, FERRITIN, TIBC, IRON, RETICCTPCT in the last 72 hours. Sepsis Labs: No results for input(s): PROCALCITON, LATICACIDVEN in the last 168 hours.  Recent Results (from the past 240 hour(s))  MRSA PCR Screening     Status: Abnormal   Collection Time: 06/23/18  9:42 PM  Result Value Ref Range Status   MRSA by PCR POSITIVE (A) NEGATIVE Final    Comment:        The GeneXpert MRSA Assay (FDA approved for NASAL specimens only), is one component of a comprehensive MRSA colonization surveillance program. It  is not intended to diagnose MRSA infection nor to guide or monitor treatment for MRSA infections. RESULT CALLED TO, READ BACK BY AND VERIFIED WITH: D.FRANCO,RN AT 0122 06/24/18 BY L.PITT      Radiology Studies: No results found.  Scheduled Meds: . apixaban  5 mg Oral BID  . aspirin EC  81 mg Oral Daily  . atorvastatin  80 mg Oral Daily  . carvedilol  25 mg Oral BID  . Chlorhexidine Gluconate Cloth  6 each Topical Q0600  . ferrous sulfate  325 mg Oral BID WC  . furosemide  20 mg Intravenous BID  . gabapentin  300 mg Oral QHS  . isosorbide mononitrate  60 mg Oral Daily  . lisinopril  2.5 mg Oral Daily  . mupirocin ointment  1 application Nasal BID  . tamsulosin  0.4 mg Oral QPC  supper   Continuous Infusions:   LOS: 1 day   Marylu Lund, MD [facility] Pager On Amion  If 7PM-7AM, please contact night-coverage 06/25/2018, 5:01 PM

## 2018-06-26 DIAGNOSIS — I169 Hypertensive crisis, unspecified: Secondary | ICD-10-CM

## 2018-06-26 LAB — BASIC METABOLIC PANEL
Anion gap: 7 (ref 5–15)
BUN: 24 mg/dL — AB (ref 8–23)
CO2: 25 mmol/L (ref 22–32)
CREATININE: 1.27 mg/dL — AB (ref 0.61–1.24)
Calcium: 9 mg/dL (ref 8.9–10.3)
Chloride: 104 mmol/L (ref 98–111)
GFR calc Af Amer: >60 mL/min (ref >60)
GFR, EST NON AFRICAN AMERICAN: 53 mL/min — AB (ref >60)
Glucose, Bld: 103 mg/dL — ABNORMAL HIGH (ref 70–99)
POTASSIUM: 3.9 mmol/L (ref 3.5–5.1)
SODIUM: 136 mmol/L (ref 135–145)

## 2018-06-26 MED ORDER — FERROUS SULFATE 325 (65 FE) MG PO TABS: 325.0000 mg | ORAL_TABLET | Freq: Two times a day (BID) | ORAL | 0 refills | Status: DC

## 2018-06-26 MED ORDER — APIXABAN 5 MG PO TABS: 5.0000 mg | ORAL_TABLET | Freq: Two times a day (BID) | ORAL | 0 refills | Status: DC

## 2018-06-26 MED ORDER — LISINOPRIL 5 MG PO TABS: 5.0000 mg | ORAL_TABLET | Freq: Every day | ORAL | Status: DC

## 2018-06-26 MED ORDER — PNEUMOCOCCAL VAC POLYVALENT 25 MCG/0.5ML IJ INJ: 0.5000 mL | INJECTION | INTRAMUSCULAR | Status: DC

## 2018-06-26 MED ORDER — PROAIR RESPICLICK 108 (90 BASE) MCG/ACT IN AEPB: 2.0000 | INHALATION_SPRAY | Freq: Four times a day (QID) | RESPIRATORY_TRACT | 0 refills | Status: DC | PRN

## 2018-06-26 MED ORDER — POTASSIUM CHLORIDE CRYS ER 20 MEQ PO TBCR: 40.0000 meq | EXTENDED_RELEASE_TABLET | Freq: Every day | ORAL | 0 refills | Status: DC

## 2018-06-26 MED ORDER — GABAPENTIN 300 MG PO CAPS: 300.0000 mg | ORAL_CAPSULE | Freq: Every day | ORAL | 0 refills | Status: DC

## 2018-06-26 MED ORDER — FUROSEMIDE 20 MG PO TABS: 20.0000 mg | ORAL_TABLET | Freq: Every day | ORAL | 3 refills | Status: DC

## 2018-06-26 MED ORDER — LISINOPRIL 2.5 MG PO TABS
2.5000 mg | ORAL_TABLET | Freq: Once | ORAL | Status: AC
  Administered 2018-06-26: 2.5 mg via ORAL
  Filled 2018-06-26: qty 1

## 2018-06-26 MED ORDER — ISOSORBIDE MONONITRATE ER 60 MG PO TB24: 60.0000 mg | ORAL_TABLET | Freq: Every day | ORAL | 0 refills | Status: DC

## 2018-06-26 MED ORDER — TAMSULOSIN HCL 0.4 MG PO CAPS: 0.4000 mg | ORAL_CAPSULE | Freq: Every day | ORAL | 0 refills | Status: DC

## 2018-06-26 MED ORDER — LISINOPRIL 5 MG PO TABS: 5.0000 mg | ORAL_TABLET | Freq: Every day | ORAL | 0 refills | Status: DC

## 2018-06-26 MED ORDER — CARVEDILOL 25 MG PO TABS: 25.0000 mg | ORAL_TABLET | Freq: Two times a day (BID) | ORAL | 0 refills | Status: DC

## 2018-06-26 MED ORDER — ASPIRIN EC 81 MG PO TBEC: 81.0000 mg | DELAYED_RELEASE_TABLET | Freq: Every day | ORAL | 0 refills | Status: DC

## 2018-06-26 NOTE — Progress Notes (Signed)
CSW was consulted by RN Carleene Overlie for transportation needs.  CSW gave  RN Carleene Overlie a taxi voucher for transportation needs home.  No further needs noted.  CSW signing off.  Reed Breech LCSWA [phone]

## 2018-06-26 NOTE — Discharge Summary (Signed)
Physician Discharge Summary  [name] OYD:[id number] DOB: 1941/05/01 DOA: 06/23/2018  PCP: Biagio Borg, MD  Admit date: 06/23/2018 Discharge date: 06/26/2018  Admitted From: Home Disposition:  Home  Recommendations for Outpatient Follow-up:  1. Follow up with PCP in 1-2 weeks  Discharge Condition:Stable CODE STATUS:Full Diet recommendation: Heart healthy   Brief/Interim Summary: 77 year old male with past medical history of CAD status post MI, systolic and diastolic CHF, previous CVA and paroxysmal A. fib who presented to his cardiologist with complaints of dizziness and episodes of slurred speech who conveyed that his blood pressure medications had run out a week prior and he did not have any money to get new ones. We blood pressure at that time was recorded to be 210/100. Patient referred over to the emergency room. In the emergency room, blood pressure even higher at 261/131. EKG unchanged and i-STAT troponin negative. Patient given clonidine and IV labetalol and placed on labetalol drip and admitted to the hospitalist service in the stepdown unit.He was also restarted on his Eliquis.  By following day, patient's blood pressure somewhat better with a systolic in the 672C. BNP checked and found to be elevated at 320. Patient started on IV Lasix. Today, patient states he is feeling better. He at times seems paranoid slightly.  Discharge Diagnoses:  Principal Problem:   Acute on chronic combined systolic and diastolic congestive heart failure (HCC) Active Problems:   BPH (benign prostatic hyperplasia)   CAD (coronary artery disease)   Chronic anemia   Hypertensive urgency   AF (paroxysmal atrial fibrillation) (HCC)   HLD (hyperlipidemia)   CVA (cerebral vascular accident) ([place])   Thrombocytopenia ([place])   Neuropathy  Principal Problem: Acute on chronic combined systolic and diastolic congestive heart failure ([place]):  -Patient was continued on IV lasix -Cr  remained stable -No significant LE edema . Active Problems: BPH (benign prostatic hyperplasia) -Patient reports some symptoms suggestive of bladder outlet obstruction -Bladder scan performed. About 200cc on bladder scan. Pt otherwise reported to void well -Continue flomax as tolerated  CAD (coronary artery disease)with history of hyperlipidemia:  -no chest pain -Stable at present  Chronic anemia:  -Stable at this time  Hypertensive urgency:  -Secondary to not being on his medications for several days. -Have resumed home meds -BP improved  AF (paroxysmal atrial fibrillation) ([place]):  -Rate controlled. Chads 2 score of 5.  -Cont on Eliquis as tolerated.  -Dr. [place] Pink called patient's pharmacy and found that his co-pay is $45. Patient's total cost for medications should be under $60 a month.  CVA (cerebral vascular accident) [facility]):  -Stable at this time.  Thrombocytopenia ([place]):  -Mild. Stable.  -No bleeding  Discharge Instructions  Discharge Instructions    AMB Referral to [place] Management   Complete by:  As directed    Please assign to [facility] for CHF and complex case management. [facility] Pharmacist for medication affordability and medication adherence, [facility] Social Worker for transportation assistance. PCP Shelba Flake) listed as doing toc. Written consent obtained. Hospital Liaison provided Senior Resource telephone number for patient to follow up with for Medicare open enrollment. Currently at [facility]. Please call with questions. Marthenia Rolling, [place], [facility] Liaison-[phone]   Reason for consult:  Please assign to [facility], [place] Worker, [facility] Pharmacist.   Diagnoses of:  Heart Failure   Expected date of contact:  1-3 days (reserved for hospital discharges)     Allergies as of 06/26/2018   No Known  Allergies     Medication List    STOP taking these medications    bismuth-metronidazole-tetracycline 140-125-125 MG capsule Commonly known as:  PYLERA     TAKE these medications   apixaban 5 MG Tabs tablet Commonly known as:  ELIQUIS Take 1 tablet (5 mg total) by mouth 2 (two) times daily.   aspirin EC 81 MG tablet Take 1 tablet (81 mg total) by mouth daily.   atorvastatin 80 MG tablet Commonly known as:  LIPITOR Take 1 tablet (80 mg total) by mouth daily.   carvedilol 25 MG tablet Commonly known as:  COREG Take 1 tablet (25 mg total) by mouth 2 (two) times daily.   ferrous sulfate 325 (65 FE) MG tablet Take 1 tablet (325 mg total) by mouth 2 (two) times daily with a meal.   furosemide 20 MG tablet Commonly known as:  LASIX TAKE 1 TABLET BY MOUTH ONCE DAILY   gabapentin 300 MG capsule Commonly known as:  NEURONTIN Take 1 capsule (300 mg total) by mouth at bedtime.   isosorbide mononitrate 60 MG 24 hr tablet Commonly known as:  IMDUR Take 1 tablet (60 mg total) by mouth daily.   lisinopril 5 MG tablet Commonly known as:  PRINIVIL,ZESTRIL Take 1 tablet (5 mg total) by mouth daily. Start taking on:  06/27/2018 What changed:    medication strength  how much to take   potassium chloride SA 20 MEQ tablet Commonly known as:  K-DUR,KLOR-CON Take 2 tablets (40 mEq total) by mouth daily.   PROAIR RESPICLICK 382 (90 Base) MCG/ACT Aepb Generic drug:  Albuterol Sulfate Inhale 2 puffs into the lungs every 6 (six) hours as needed (for wheezing or shortness of breath).   tamsulosin 0.4 MG Caps capsule Commonly known as:  FLOMAX Take 1 capsule (0.4 mg total) by mouth daily after supper.       No Known Allergies   Procedures/Studies:  No results found.  Subjective: Without complaints  Discharge Exam: Vitals:   06/25/18 2238 06/26/18 0608  BP: (!) 194/90 (!) 150/84  Pulse: 64 60  Resp: 19 19  Temp:  97.9 F (36.6 C)  SpO2:  100%   Vitals:   06/25/18 1716 06/25/18 1719 06/25/18 2238 06/26/18 0608  BP: (!) 125/103 (!)  152/125 (!) 194/90 (!) 150/84  Pulse:  67 64 60  Resp:   19 19  Temp:  97.8 F (36.6 C)  97.9 F (36.6 C)  TempSrc:  Oral  Oral  SpO2:  100%  100%  Weight:    73.1 kg  Height:        General: Pt is alert, awake, not in acute distress Cardiovascular: RRR, S1/S2 +, no rubs, no gallops Respiratory: CTA bilaterally, no wheezing, no rhonchi Abdominal: Soft, NT, ND, bowel sounds + Extremities: no edema, no cyanosis   The results of significant diagnostics from this hospitalization (including imaging, microbiology, ancillary and laboratory) are listed below for reference.     Microbiology: Recent Results (from the past 240 hour(s))  MRSA PCR Screening     Status: Abnormal   Collection Time: 06/23/18  9:42 PM  Result Value Ref Range Status   MRSA by PCR POSITIVE (A) NEGATIVE Final    Comment:        The GeneXpert MRSA Assay (FDA approved for NASAL specimens only), is one component of a comprehensive MRSA colonization surveillance program. It is not intended to diagnose MRSA infection nor to guide or monitor treatment for MRSA infections. RESULT CALLED TO,  READ BACK BY AND VERIFIED WITH: D.FRANCO,RN AT 0122 06/24/18 BY L.PITT      Labs: BNP (last 3 results) Recent Labs    06/24/18 0830  BNP 786.7*   Basic Metabolic Panel: Recent Labs  Lab 06/23/18 1557 06/25/18 0507 06/26/18 0317  NA 139 134* 136  K 3.9 4.2 3.9  CL 108 103 104  CO2 26 24 25   GLUCOSE 75 110* 103*  BUN 17 19 24*  CREATININE 1.29* 1.32* 1.27*  CALCIUM 8.9 8.8* 9.0   Liver Function Tests: Recent Labs  Lab 06/23/18 1557  AST 19  ALT 13  ALKPHOS 58  BILITOT 0.5  PROT 7.5  ALBUMIN 3.6   No results for input(s): LIPASE, AMYLASE in the last 168 hours. No results for input(s): AMMONIA in the last 168 hours. CBC: Recent Labs  Lab 06/23/18 1557 06/25/18 0507  WBC 5.0 6.1  NEUTROABS 2.9  --   HGB 12.0* 11.7*  HCT 41.6 38.9*  MCV 74.7* 71.4*  PLT 141* 137*   Cardiac Enzymes: No  results for input(s): CKTOTAL, CKMB, CKMBINDEX, TROPONINI in the last 168 hours. BNP: Invalid input(s): POCBNP CBG: No results for input(s): GLUCAP in the last 168 hours. D-Dimer No results for input(s): DDIMER in the last 72 hours. Hgb A1c No results for input(s): HGBA1C in the last 72 hours. Lipid Profile No results for input(s): CHOL, HDL, LDLCALC, TRIG, CHOLHDL, LDLDIRECT in the last 72 hours. Thyroid function studies No results for input(s): TSH, T4TOTAL, T3FREE, THYROIDAB in the last 72 hours.  Invalid input(s): FREET3 Anemia work up No results for input(s): VITAMINB12, FOLATE, FERRITIN, TIBC, IRON, RETICCTPCT in the last 72 hours. Urinalysis    Component Value Date/Time   COLORURINE YELLOW 01/27/2018 1523   APPEARANCEUR CLEAR 01/27/2018 1523   LABSPEC 1.025 01/27/2018 1523   PHURINE 6.0 01/27/2018 1523   GLUCOSEU NEGATIVE 01/27/2018 1523   HGBUR TRACE-INTACT (A) 01/27/2018 1523   BILIRUBINUR NEGATIVE 01/27/2018 1523   KETONESUR TRACE (A) 01/27/2018 1523   PROTEINUR 100 (A) 04/03/2017 2108   UROBILINOGEN 0.2 01/27/2018 1523   NITRITE NEGATIVE 01/27/2018 1523   LEUKOCYTESUR NEGATIVE 01/27/2018 1523   Sepsis Labs Invalid input(s): PROCALCITONIN,  WBC,  LACTICIDVEN Microbiology Recent Results (from the past 240 hour(s))  MRSA PCR Screening     Status: Abnormal   Collection Time: 06/23/18  9:42 PM  Result Value Ref Range Status   MRSA by PCR POSITIVE (A) NEGATIVE Final    Comment:        The GeneXpert MRSA Assay (FDA approved for NASAL specimens only), is one component of a comprehensive MRSA colonization surveillance program. It is not intended to diagnose MRSA infection nor to guide or monitor treatment for MRSA infections. RESULT CALLED TO, READ BACK BY AND VERIFIED WITH: D.FRANCO,RN AT 0122 06/24/18 BY L.PITT    Time spent: 30 min  SIGNED:   Marylu Lund, MD  [facility] 06/26/2018, 11:15 AM  If 7PM-7AM, please contact night-coverage

## 2018-06-26 NOTE — Care Management (Signed)
Pt reports he has no prescription medication to take at home.  Dr. Wyline Copas advised and wrote new prescriptions for patient's medication.  Pt will receive all of today's doses here prior to dc.  Since the plan was previously made for [facility] to help with medications, advised pt to call [facility] early tomorrow morning for help with Eliquis and BP meds.  Pt is not eligible for MATCH as he has prescription drug coverage.

## 2018-06-27 ENCOUNTER — Other Ambulatory Visit: Payer: Self-pay | Admitting: Pharmacist

## 2018-06-27 ENCOUNTER — Telehealth: Payer: Self-pay | Admitting: *Deleted

## 2018-06-27 ENCOUNTER — Other Ambulatory Visit: Payer: Self-pay

## 2018-06-27 ENCOUNTER — Other Ambulatory Visit: Payer: Self-pay | Admitting: Internal Medicine

## 2018-06-27 NOTE — Patient Outreach (Signed)
[facility]) Care Management  06/27/2018  [name] [birth year] [id number]  Successful outreach to the patient on today's date, HIPAA identifiers confirmed. BSW introduced self to the patient and the reason for today's call, indicating the patient had been referred for assistance with transportation. The patient confirms need for transportation resources as he is unable to afford to repair his car. The patient confirms coverage under [facility]). BSW educated the patient on the [facility] transportation benefit provided by [employer]. BSW will plan to mail the patient a handout on how to access this transportation benefit.   The patient reports interest in obtaining a SCAT pass. The patient reports he is ambulatory and walks with a cane. The patient identifies a medical history of "two strokes and a heart attack". The patient also indicates he walks slow due to a limp. BSW discussed SCAT application process and plan to assist the patient in completing. As BSW reviewed application with the patient it was reported the patient lives less than 1 block from the bus stop and is able to access and utilize.   BSW explained that given the patients ability to successfully use the fixed route, he may not qualify for SCAT services. The patient stated understanding. The patient further reported "they did turn me down one time". It is reported the patient applied for SCAT within the last three months and was denied. The patient denies experiencing a recent change in medical condition and ability to access the fixed route during today's call. BSW explained that if he has not experienced changes to limit his ability to use the fixed route he would most likely not qualify at this time. The patient stated understanding.  Plan: BSW to contact the patient in the next three weeks to confirm receipt of mailed resource.  Daneen Schick, BSW, CDP [facility] [phone]

## 2018-06-27 NOTE — Patient Outreach (Signed)
[facility]) Care Management  [place]   06/27/2018  [name] [birth year] [id number]  Reason for referral: medication assistance, medication review, medication adherence  Referral source: [facility] inpatient liaison Referral medication(s): All medications Current insurance: [employer]  PMHx includes but not limited to: CAD s/p MI, CHF, previous CVA, PAF non-compliant with anticoagulation, BPH, HTN, HLD, and history of medication non-compliance with hospitalization 11/21-24 for hypertensive emergency and acute on chronic CHF after stopping blood pressure medications.   Per review of notes, patient has had 3 previous [place] referrals and all have been closed due to non-engagement from patient.    Outreach: Successful call to [name]. HIPAA identifiers verified. Patient states he went to the hospital because "I ran out of my medicine."  He reports he only has one pillbottle of carvedilol at home and all his other medication ran out.  He states he has blood pressure cuff at home and checks blood pressure regularly.  He reports he lives by himself and manages his medications alone.  He states he has cousins that live nearby but that they will not assist him with healthcare.  He reports his friend, [name] (phone [phone]) may be able to help him consistently with his medications.  He reports she is out-of-town right now but is agreeable to call her together next week to discuss medication management.    Objective: No Known Allergies  Medications Reviewed Today    Reviewed by Serita Butcher, CPhT (Pharmacy Technician) on 06/23/18 at [place] List Status: Complete  Medication Order Taking? Sig Documenting Provider Last Dose Status Informant  apixaban (ELIQUIS) 5 MG TABS tablet [id number] Yes Take 1 tablet (5 mg total) by mouth 2 (two) times daily. Biagio Borg, MD Past Week Unknown time Active Self  aspirin EC 81 MG tablet [id number] Yes Take 1  tablet (81 mg total) by mouth daily. Rosita Fire, MD Past Week Unknown time Active Self  atorvastatin (LIPITOR) 80 MG tablet [id number] Yes Take 1 tablet (80 mg total) by mouth daily. Biagio Borg, MD 06/22/2018 Unknown time Active Self  bismuth-metronidazole-tetracycline [facility]) 905 155 7570 MG capsule [id number] Yes Take 3 capsules by mouth 4 (four) times daily -  before meals and at bedtime. Ladene Artist, MD Past Week Unknown time Active Self  carvedilol (COREG) 25 MG tablet [id number] Yes Take 1 tablet (25 mg total) by mouth 2 (two) times daily. Biagio Borg, MD Past Week Unknown time Active Self  ferrous sulfate 325 (65 FE) MG tablet [id number] Yes Take 1 tablet (325 mg total) by mouth 2 (two) times daily with a meal. Ladene Artist, MD Past Week Unknown time Active Self  furosemide (LASIX) 20 MG tablet [id number] Yes TAKE 1 TABLET BY MOUTH ONCE DAILY  Patient taking differently:  Take 20 mg by mouth daily.    Biagio Borg, MD Past Week Unknown time Active Self  gabapentin (NEURONTIN) 300 MG capsule [id number] Yes Take 1 capsule (300 mg total) by mouth at bedtime. Biagio Borg, MD Past Week Unknown time Active Self  isosorbide mononitrate (IMDUR) 60 MG 24 hr tablet [id number] Yes Take 1 tablet (60 mg total) by mouth daily. Biagio Borg, MD Past Week Unknown time Active Self  lisinopril (PRINIVIL,ZESTRIL) 2.5 MG tablet [id number] Yes Take 1 tablet (2.5 mg total) by mouth daily. Biagio Borg, MD Past Week Unknown time Active Self        Discontinued 06/23/18 1719 (Patient Preference)  potassium chloride SA (K-DUR,KLOR-CON) 20 MEQ tablet [id number] Yes Take 2 tablets (40 mEq total) by mouth daily. Biagio Borg, MD Past Week Unknown time Active Self  PROAIR RESPICLICK 621 [phone] Base) MCG/ACT AEPB [id number] Yes Inhale 2 puffs into the lungs every 6 (six) hours as needed (for wheezing or shortness of breath). Biagio Borg, MD prn Active Self  tamsulosin [facility]) 0.4 MG CAPS capsule  [id number] Yes Take 1 capsule (0.4 mg total) by mouth daily after supper. Biagio Borg, MD Past Week Unknown time Active Self          Assessment: Date Discharged from Hospital:  Date Medication Reconciliation Performed: 06/28/2018   Medications Discontinued at Discharge:   [place]  New Medications at Discharge:   None  Medications with Dose Adjustments at Discharge: . None  Patient was recently discharged from hospital and all medications have been reviewed.  Drugs sorted by system:  Neurologic/Psychologic: gabapentin  Cardiovascular: aspirin, atorvastatin, carvedilol, furosemide, isosorbide, lisinopril  Pulmonary/Allergy: albuterol inhaler  Genitourinary: tamsulosin  Vitamins/Minerals/Supplements: ferrous sulfate, potassium  Hematologic:  Apixaban  Medication Management:  Patient has very poor understanding of what medications he is taking, how to take them, and how to organize and manage refills.     He will benefit from more support from family / friends with medication management.   Only has carvedilol medication at home   3-way call placed to [facility] mail order pharmacy [facility]).  Patient previously assisted in March 2019 with setting up mail order for Tier 1 and 2 prescriptions as these are all no charge.  Per representative from [employer], all prescriptions on file expired.  Representative sent in requests for new prescriptions to Dr. Cathlean Cower for the following medications: -Lisinopril (new dose 34m) -Potassium (new dose 478m) -Furosemide (new dose 2057m-Atorvastatin 41m40may -Carvedilol 25mg59m -Isosorbide 60mg 77msulosin 0.4mg  O72m prescriptions received, medication will be shipped to patient within 24-48 hours.    Call placed to [employer] [place] program.  Verified that patient has $60 / quarter to spend on OTC items.  Catalogue ordered for patient to be sent to his home.  Patient declined needing to order any items today.  States he has a  blood pressure cuff at home already.    Other medications remaining that are at local pharmacy:  -Albuterol ([facility]Air) -Gabapentin -Apixaban -Aspirin 81mg -F75mus sulfate -Gabapentin   I reviewed the importance of taking apixaban with patient and discussed co-pay.  Patient reports he will get his paycheck today.  He states he will be able to pick up his apixaban tomorrow from the pharmacy.    Medication Assistance Findings:  Extra Help:   _0  Already receiving Full Extra Help  _1  Already receiving Partial Extra Help (per review of pharmacist notes)  _2  Eligible based on reported income and assets  _3  Not Eligible based on reported income and assets  Patient Assistance Programs: 1) Apixaban made by [employer] o Income requirement met: _4  Yes _5  No _6  Unknown o Out-of-pocket prescription expenditure met:    _7  Yes _8  No  _9  Unknown  <RemoveB<[id number]>_4<\/[id number]>_95plicable - Reviewed program with patient including requirement to spent a certain amount of money of medications before being eligible.  Patient had poor understanding of financial discussion.  Will need to review this also with patient's friend, Connie. Marlowe Kayslan: I will follow-up next week with patient regarding 1) picking up apixaban, 2) mail order medications, and 3) contacting friend, Connie,[name]  to request more support with patient's medication management  Ralene Bathe, PharmD, [place] [phone]

## 2018-06-27 NOTE — Telephone Encounter (Signed)
Tried calling pt to make hosp follow-up appt w/PCP. There was no answer LMOM RTC to make appt. (sent [facility]EC CRM w/info).Marland KitchenJohny Chess

## 2018-06-28 ENCOUNTER — Other Ambulatory Visit: Payer: Self-pay | Admitting: *Deleted

## 2018-06-28 NOTE — Telephone Encounter (Signed)
Transition Care Management Follow-up Telephone Call   Date discharged? 06/26/18   How have you been since you were released from the hospital? Pt states he is doing alright   Do you understand why you were in the hospital? YES   Do you understand the discharge instructions? YES   Where were you discharged to? Home   Items Reviewed:  Medications reviewed: YES, he states he only taking his carvedilol, and flomax   Allergies reviewed: YES  Dietary changes reviewed: YES  Referrals reviewed: YES   Functional Questionnaire:   Activities of Daily Living (ADLs):   He states he are independent in the following: ambulation, bathing and hygiene, feeding, continence, grooming, toileting and dressing States he doesn't require assistance    Any transportation issues/concerns?: NO   Any patient concerns? NO   Confirmed importance and date/time of follow-up visits scheduled YES, appt 07/06/18  Provider Appointment booked with Dr. [name]  Confirmed with patient if condition begins to worsen call PCP or go to the ER.  Patient was given the office number and encouraged to call back with question or concerns.  : YES

## 2018-06-28 NOTE — Patient Outreach (Signed)
[facility]) Care Management  06/28/2018  [name] 29-Mar-1941 [id number]    RN initial outreach attempt unsuccessful however RN able to leave  HIPAA approved voice message requesting a call back. Will further engage at that time on possible needs. Note unsuccessful outreach letter was sent and primary provider's office to due transition of care.  Raina Mina, RN Care Management Coordinator [place] Office [phone]

## 2018-07-04 ENCOUNTER — Other Ambulatory Visit: Payer: Self-pay

## 2018-07-04 ENCOUNTER — Other Ambulatory Visit: Payer: Self-pay | Admitting: *Deleted

## 2018-07-04 MED ORDER — FUROSEMIDE 20 MG PO TABS: 20.0000 mg | ORAL_TABLET | Freq: Every day | ORAL | 3 refills | Status: DC

## 2018-07-04 MED ORDER — LISINOPRIL 5 MG PO TABS: 5.0000 mg | ORAL_TABLET | Freq: Every day | ORAL | 0 refills | Status: DC

## 2018-07-04 MED ORDER — POTASSIUM CHLORIDE CRYS ER 20 MEQ PO TBCR: 40.0000 meq | EXTENDED_RELEASE_TABLET | Freq: Every day | ORAL | 0 refills | Status: DC

## 2018-07-04 NOTE — Patient Outreach (Signed)
[facility]) Care Management  07/04/2018  LADD CEN 1940/10/26 [id number]  RN 2nd outreach attempt unsuccessful and unable to leave a HIPAA approved voice message. Will follow up once again later in this week. Note outreach letter was sent to pt 2 weeks ago. Will allow pt time to respond to letter.  Raina Mina, RN Care Management Coordinator [place] Office [phone]

## 2018-07-06 ENCOUNTER — Other Ambulatory Visit: Payer: Self-pay | Admitting: Pharmacist

## 2018-07-06 ENCOUNTER — Ambulatory Visit (INDEPENDENT_AMBULATORY_CARE_PROVIDER_SITE_OTHER): Payer: Medicare Other | Admitting: Internal Medicine

## 2018-07-06 ENCOUNTER — Encounter: Payer: Self-pay | Admitting: Internal Medicine

## 2018-07-06 VITALS — BP 126/84 | HR 62 | Temp 98.0°F | Ht 66.5 in | Wt 170.0 lb

## 2018-07-06 DIAGNOSIS — I10 Essential (primary) hypertension: Secondary | ICD-10-CM

## 2018-07-06 DIAGNOSIS — I480 Paroxysmal atrial fibrillation: Secondary | ICD-10-CM

## 2018-07-06 DIAGNOSIS — I5043 Acute on chronic combined systolic (congestive) and diastolic (congestive) heart failure: Secondary | ICD-10-CM

## 2018-07-06 DIAGNOSIS — R269 Unspecified abnormalities of gait and mobility: Secondary | ICD-10-CM | POA: Diagnosis not present

## 2018-07-06 NOTE — Assessment & Plan Note (Signed)
stable overall by history and exam, recent data reviewed with pt, and pt to continue medical treatment as before,  to f/u any worsening symptoms or concerns  

## 2018-07-06 NOTE — Assessment & Plan Note (Addendum)
With left leg weakness at times (none today) and difficulty ambulating which is painless, but higher risk for fall, declines PT eval, will ask Sports med to see

## 2018-07-06 NOTE — Patient Instructions (Signed)
Please continue all other medications as before, and refills have been done if requested.  Please have the pharmacy call with any other refills you may need.  Please continue your efforts at being more active, low cholesterol diet, and weight control.  Please keep your appointments with your specialists as you may have planned  You will be contacted regarding the referral for: Sports medicine  Please call if you change your mind about the [place] with Physical Therapy  Please return in 2 months, or sooner if needed

## 2018-07-06 NOTE — Progress Notes (Signed)
Subjective:    Patient ID: [name], male    DOB: 08-21-1940, 77 y.o.   MRN: [id number]  HPI    Here to f/u recent hospn 11/21 - 11/24 with acute on chronic CHF, HTn urgency and afib but not taking anticoagulation, or other meds due to cost and out for at least a week.  Was diuresed well, all meds restarted, BP markedly improved, renal function improved, eliquis restarted, and today Pt denies chest pain, increased sob or doe, wheezing, orthopnea, PND, increased LE swelling, palpitations, dizziness or syncope.  Pt denies new neurological symptoms such as new headache, or facial or extremity weakness or numbness.  Does have ongoing difficulty with gait that may have something to do with his back, hip and/or knees, not sure.  No falls post hospn   Pt denies polydipsia, polyuria,   No new complaints.  Good compliance with current meds, most of which he was given at time of d/c I think by the new hospital d/c medication program to prevent rehospn due to lack of meds Past Medical History:  Diagnosis Date  . Anemia, iron deficiency 08/15/2011  . CAD (coronary artery disease)    cath in 1/08: EF 60%, mild dilated Ao root, oD2 30%, LAD 20-30%.  . Dilated aortic root ([place]) 08/15/2011  . Dyslipidemia   . Dysrhythmia   . History of MI (myocardial infarction) 08/15/2011  . History of pneumonia 08/15/2011  . History of stroke   . Hyperlipidemia   . Hypertension    echocardiogram 6/10: Moderate LVH, EF 55-65%, mild, mild MR, MAC, mild LAE, PASP 32  . Myocardial infarction ([place])   . Paroxysmal atrial fibrillation (HCC)   . Prediabetes 10/08/2015  . Preoperative cardiovascular examination 10/09/2015  . Shortness of breath dyspnea   . Stroke ([place]) 10/2011  . 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 CARDIAC CATHETERIZATION    . COLONOSCOPY    . ENDARTERECTOMY Right 10/10/2015   Procedure: ENDARTERECTOMY CAROTID-RIGHT;  Surgeon: Serafina Mitchell, MD;  Location: [facility] OR;  Service: Vascular;  Laterality: Right;  . IR RADIOLOGIST EVAL & MGMT  02/11/2017  . IR RADIOLOGIST EVAL & MGMT  03/24/2017    reports that he has never smoked. He has never used smokeless tobacco. He reports that he does not drink alcohol or use drugs. family history includes Aneurysm in his sister. No Known Allergies Current Outpatient Medications on File Prior to Visit  Medication Sig Dispense Refill  . apixaban (ELIQUIS) 5 MG TABS tablet Take 1 tablet (5 mg total) by mouth 2 (two) times daily. 60 tablet 0  . aspirin EC 81 MG tablet Take 1 tablet (81 mg total) by mouth daily. 30 tablet 0  . atorvastatin (LIPITOR) 80 MG tablet Take 1 tablet (80 mg total) by mouth daily. 90 tablet 3  . atorvastatin (LIPITOR) 80 MG tablet TAKE 1 TABLET BY MOUTH  DAILY 90 tablet 3  . carvedilol (COREG) 25 MG tablet Take 1 tablet (25 mg total) by mouth 2 (two) times daily. 60 tablet 0  . carvedilol (COREG) 25 MG tablet TAKE 1 TABLET BY MOUTH TWO  TIMES DAILY 180 tablet 3  . ferrous sulfate 325 (65 FE) MG tablet Take 1 tablet (325 mg total) by mouth 2 (two) times daily with a meal. 60 tablet 0  . furosemide (LASIX) 20 MG tablet Take 1 tablet (20 mg total) by mouth daily. 180 tablet 3  .  gabapentin (NEURONTIN) 300 MG capsule Take 1 capsule (300 mg total) by mouth at bedtime. 30 capsule 0  . isosorbide mononitrate (IMDUR) 60 MG 24 hr tablet Take 1 tablet (60 mg total) by mouth daily. 90 tablet 0  . isosorbide mononitrate (IMDUR) 60 MG 24 hr tablet TAKE 1 TABLET BY MOUTH  DAILY 90 tablet 3  . lisinopril (PRINIVIL,ZESTRIL) 5 MG tablet Take 1 tablet (5 mg total) by mouth daily. 30 tablet 0  . potassium chloride SA (K-DUR,KLOR-CON) 20 MEQ tablet Take 2 tablets (40 mEq total) by mouth daily. 60 tablet 0  . PROAIR RESPICLICK 654 (90 Base) MCG/ACT AEPB Inhale 2 puffs into the lungs every 6 (six) hours as needed (for wheezing or shortness of breath). 1 each 0  . tamsulosin (FLOMAX) 0.4 MG CAPS  capsule Take 1 capsule (0.4 mg total) by mouth daily after supper. 30 capsule 0  . tamsulosin (FLOMAX) 0.4 MG CAPS capsule TAKE 1 CAPSULE BY MOUTH  DAILY AFTER SUPPER 90 capsule 3   No current facility-administered medications on file prior to visit.    Review of Systems  Constitutional: Negative for other unusual diaphoresis or sweats HENT: Negative for ear discharge or swelling Eyes: Negative for other worsening visual disturbances Respiratory: Negative for stridor or other swelling  Gastrointestinal: Negative for worsening distension or other blood Genitourinary: Negative for retention or other urinary change Musculoskeletal: Negative for other MSK pain or swelling Skin: Negative for color change or other new lesions Neurological: Negative for worsening tremors and other numbness  Psychiatric/Behavioral: Negative for worsening agitation or other fatigue All other system neg per pt    Objective:   Physical Exam BP 126/84   Pulse 62   Temp 98 F (36.7 C) (Oral)   Ht 5' 6.5" (1.689 m)   Wt 170 lb (77.1 kg)   SpO2 97%   BMI 27.03 kg/m  VS noted,  Constitutional: Pt appears in NAD HENT: Head: NCAT.  Right Ear: External ear normal.  Left Ear: External ear normal.  Eyes: . Pupils are equal, round, and reactive to light. Conjunctivae and EOM are normal Nose: without d/c or deformity Neck: Neck supple. Gross normal ROM Cardiovascular: Normal rate and irregular rhythm.   Pulmonary/Chest: Effort normal and breath sounds without rales or wheezing.  Abd:  Soft, NT, ND, + BS, no organomegaly Neurological: Pt is alert. At baseline orientation, motor grossly intact Skin: Skin is warm. No rashes, other new lesions, no LE edema Psychiatric: Pt behavior is normal without agitation  No other exam findings Lab Results  Component Value Date   WBC 6.1 06/25/2018   HGB 11.7 (L) 06/25/2018   HCT 38.9 (L) 06/25/2018   PLT 137 (L) 06/25/2018   GLUCOSE 103 (H) 06/26/2018   CHOL 247 (H)  01/27/2018   TRIG 152.0 (H) 01/27/2018   HDL 44.00 01/27/2018   LDLDIRECT 230.0 01/20/2013   LDLCALC 173 (H) 01/27/2018   ALT 13 06/23/2018   AST 19 06/23/2018   NA 136 06/26/2018   K 3.9 06/26/2018   CL 104 06/26/2018   CREATININE 1.27 (H) 06/26/2018   BUN 24 (H) 06/26/2018   CO2 25 06/26/2018   TSH 0.92 01/27/2018   PSA 1.43 09/16/2017   INR 1.35 04/04/2017   HGBA1C 6.5 01/27/2018       Assessment & Plan:

## 2018-07-07 NOTE — Patient Outreach (Signed)
[place] [facility]) Care Management  [place] 07/07/2018  [name] 10-16-1940 [id number]  Reason for call: f/u on medication adherence  Successful call to [name] today.  Patient reports that he picked up several medications last week from the pharmacy but is not sure the names of the medications.  He also states he received several medications from the mail order.  He states his friend, [name], is with him right now.  He has identified [name] as a person that can assist him with medication organization and management.  [name] is agreeable to assist but requests that I come to the house to review all medications and discuss adherence issues face-to-face.   Plan: Home visit scheduled with [name] and [name] next Wednesday, December 11th  at 1:30 PM  Ralene Bathe, PharmD, [place] [phone]

## 2018-07-08 ENCOUNTER — Other Ambulatory Visit: Payer: Self-pay | Admitting: *Deleted

## 2018-07-08 NOTE — Patient Outreach (Signed)
[place] [facility]) Care Management  07/08/2018  [name] 17-Dec-1940 [id number]    3rd outreach attempt unsuccessful however able to leave a HIPAA approved voice message requesting a call back for service. RN will reach out the the other [facility] team members to inquire further on possible services needed for this case manager. Note if allow will participate in joint home visit if pt is receptive. If not plan to close this discipline out next week.  Raina Mina, RN Care Management Coordinator [place] Office [phone]

## 2018-07-13 ENCOUNTER — Other Ambulatory Visit: Payer: Self-pay | Admitting: Pharmacist

## 2018-07-13 ENCOUNTER — Telehealth: Payer: Self-pay | Admitting: Internal Medicine

## 2018-07-13 ENCOUNTER — Ambulatory Visit: Payer: Self-pay | Admitting: *Deleted

## 2018-07-13 NOTE — Patient Outreach (Addendum)
[place] [facility]) Care Management  [place]   07/13/2018  [name] Jul 25, 1941 [id number]   Reason for referral: medication management  Referral source: [name] inpatient liaison Current insurance: [employer]  PMHx includes but not limited to: CAD s/p MI, CHF, previous CVAs, PAF non-compliant with anticoagulation, BPH, HTN, HLD, and history of medication non-compliance with hospitalization 11/21-24 for hypertensive emergency and acute on chronic CHF after stopping blood pressure medications.   Outreach:  Home visit with patient to review medications and discuss adherence with patient and friend, [name], who has stated she will help [name] with managing medications.    Upon arrival, patient states that his friend, [name], is sick and cannot come to our visit anymore.    Patient has several pillboxes on his table but is not currently using any.  He reports that [name] sometimes fills it for him otherwise he takes the pills directly from the bottle.  He appears to have some vision difficulties with reading the pill bottles.   He has many off-topic questions during visit about a brain procedure to take all his health problems away and if becoming stressed will cause death.     Objective: Lab Results  Component Value Date   CREATININE 1.27 (H) 06/26/2018   CREATININE 1.32 (H) 06/25/2018   CREATININE 1.29 (H) 06/23/2018    Lab Results  Component Value Date   HGBA1C 6.5 01/27/2018    Lipid Panel     Component Value Date/Time   CHOL 247 (H) 01/27/2018 1508   TRIG 152.0 (H) 01/27/2018 1508   HDL 44.00 01/27/2018 1508   CHOLHDL 6 01/27/2018 1508   VLDL 30.4 01/27/2018 1508   LDLCALC 173 (H) 01/27/2018 1508   LDLDIRECT 230.0 01/20/2013 1547    BP Readings from Last 3 Encounters:  07/06/18 126/84  06/26/18 110/76  06/23/18 (!) 210/100   Blood pressure readings from 07/13/2018:  209/106 189/99 154/94  No Known Allergies  Medications  Reviewed Today    Reviewed by Craig Borg, MD (Physician) on 07/06/18 at 2109  Med List Status: <None>  Medication Order Taking? Sig Documenting Provider Last Dose Status Informant  apixaban (ELIQUIS) 5 MG TABS tablet [id number] Yes Take 1 tablet (5 mg total) by mouth 2 (two) times daily. Craig Hazel, MD Taking Active   aspirin EC 81 MG tablet [id number] Yes Take 1 tablet (81 mg total) by mouth daily. Craig Hazel, MD Taking Active   atorvastatin (LIPITOR) 80 MG tablet [id number] Yes Take 1 tablet (80 mg total) by mouth daily. Craig Borg, MD Taking Active Self  atorvastatin (LIPITOR) 80 MG tablet [id number] Yes TAKE 1 TABLET BY MOUTH  DAILY Craig Borg, MD Taking Active   carvedilol (COREG) 25 MG tablet [id number] Yes Take 1 tablet (25 mg total) by mouth 2 (two) times daily. Craig Hazel, MD Taking Active   carvedilol (COREG) 25 MG tablet [id number] Yes TAKE 1 TABLET BY MOUTH TWO  TIMES DAILY Craig Borg, MD Taking Active   ferrous sulfate 325 (65 FE) MG tablet [id number] Yes Take 1 tablet (325 mg total) by mouth 2 (two) times daily with a meal. Craig Hazel, MD Taking Active   furosemide (LASIX) 20 MG tablet [id number] Yes Take 1 tablet (20 mg total) by mouth daily. Craig Borg, MD Taking Active   gabapentin (NEURONTIN) 300 MG capsule [id number] Yes Take 1 capsule (300 mg total) by mouth at bedtime. Craig Hazel, MD Taking Active  isosorbide mononitrate (IMDUR) 60 MG 24 hr tablet [id number] Yes Take 1 tablet (60 mg total) by mouth daily. Craig Hazel, MD Taking Active   isosorbide mononitrate (IMDUR) 60 MG 24 hr tablet [id number] Yes TAKE 1 TABLET BY MOUTH  DAILY Craig Borg, MD Taking Active   lisinopril (PRINIVIL,ZESTRIL) 5 MG tablet [id number] Yes Take 1 tablet (5 mg total) by mouth daily. Craig Borg, MD Taking Active   potassium chloride SA (K-DUR,KLOR-CON) 20 MEQ tablet [id number] Yes Take 2 tablets (40 mEq total) by mouth daily. Craig Borg, MD Taking Active   PROAIR  RESPICLICK 263 [phone] Base) MCG/ACT AEPB [id number] Yes Inhale 2 puffs into the lungs every 6 (six) hours as needed (for wheezing or shortness of breath). Craig Hazel, MD Taking Active   tamsulosin [facility]) 0.4 MG CAPS capsule [id number] Yes Take 1 capsule (0.4 mg total) by mouth daily after supper. Craig Hazel, MD Taking Active   tamsulosin [facility]) 0.4 MG CAPS capsule [id number] Yes TAKE 1 CAPSULE BY MOUTH  DAILY AFTER SUPPER Craig Borg, MD Taking Active           ASSESSMENT: Date Discharged from [name]: 06/26/2018 Date Medication Reconciliation Performed: 07/14/2018   Medications Discontinued at Discharge:   Pylera  New Medications at Discharge:   None  Medications with Dose Adjustments at Discharge: . Lisinopril  Patient was recently discharged from [name] and all medications have been reviewed.  Drugs sorted by system:  Neurologic/Psychologic: gabapentin  Cardiovascular: aspirin 81mg , atorvastatin, carvedilol, furosemide, isosorbide mononitrate, lisinopril  Pulmonary/Allergy: albuterol inhaler  Genitourinary: tamsulosin  Vitamins/Minerals/Supplements: potassium  Hematologic: apixaban, ferrous sulfate  Medication Review Findings:  . Mail order medications arrived for: atorvastatin, carvedilol, furosemide, isosorbide mononitrate.  No charge for these medications.  . Gabapentin: not taking due to cost, states he does not need this filled today . Apixaban: not taking due to cost, has not refilled since discharge, no history of patient ever filling this medication at [place] $45 at [facility], pharmacy to fill for patient today, will keep ready x 7 days in case he can find a way to pay for it . Aspirin: not taking, possibly due to cost and lack of understanding of regimen o 4 bottles of aspirin 81mg  EC ordered from [place] program at no charge to patient.  Should arrive in 7-10 business days.       . Albuterol inhaler: not taking due to cost and unaware of  RX at pharmcacy o Co-pay $9.68 at [facility] (patient has partial Extra Help), pharmacy will fill for patient, patient states he can pay for this . Lisinopril, tamsulosin, potassium, ferrous sulfate all need refills soon.   o Refill request placed for lisinopril, tamsulosin, potassium with mail order pharmacy [facility]), all can be refilled on 07/17/2018.  No remaining refills after this.  Will need new RXs from provider to continue.  Will route note to MD to request this.   . Adherence:  Unclear how patient actually taking medications but does not appear to have good understanding of regimen.  Has pillboxes but not able to fill them accurately by himself.  Friend may occasionally assist.  Did not take AM medications until 12:30 PM.  Needs close supervision to better manage medications.   o Filled 1 week supply of medications in pillbox (AM/PM) for patient.  Reviewed how to use pillbox and patient confirmed how to take medications via teach back method several times. Counseled patient on correct timing of  medications and to take morning medications earlier in the day when he wakes up.  o Call placed to patient's friend, [name].  She requests that I call her next week to reschedule home visit.  o No other friends or relatives identified by patient who can help him manage medications.   . Cost:  Patient currently has Partial Extra Help which provides patient with either 15% co-insurance or copay, whichever is lower.   o Apixaban copay of $45 is lower than 15% of drug cost however patient still cannot afford spending $45 / month right now.  Only patient assistance program requires that he has spent 3% of gross annual income on prescriptions before applying which patient has not done.  Will need either samples, cost assistance from friends, or change in therapy however warfarin not a good option either due to frequent monitoring.   o Assisted patient with ordering several products from [facility] OTC catalogue  including:  - Aspirin, hand sanitizer, body wipes, lotion.  Patient has $60 to use every quarter.  Will need assistance with calling in orders in order to utilize this benefit.   Hypertensive urgency:    Blood pressure highly uncontrolled during visit.  Call placed to [name] office and spoke with triage RN.  Noted that patient not taking antiplatelet or anticoagulant.  Patient denied having any blurred vision, mental status changes, dizziness, headaches, or signs / symptoms of stroke currently.  He does state he has residual speech changes from prior strokes.  Appointment made with PCP for office visit tomorrow at 4:00 PM for BP check.  Patient voiced understanding and confirmed he will bring current medications to appointment.  Reviewed how to watch for any signs / symptoms of stroke and to call 911 immediately if any of these noted.     Plan: Follow-up with patient and [name] early next week to discuss medication management.   Route note to MD regarding home visit.  Follow decision with patient's anticoagulation regimen.   Notify MD regarding need for new RXs for mail order pharmacy for lisinopril, tamsulosin, potassium, ferrous sulfate  Ralene Bathe, PharmD, [place] [phone]

## 2018-07-13 NOTE — Telephone Encounter (Signed)
See triage note.

## 2018-07-13 NOTE — Telephone Encounter (Signed)
[facility] Pharmacist at home visit with patient. Called to report his b/p was 209/86 P. 60's 2nd b/p 189/99 p. 58 bpm. He denies dizziness/CP/SOB/Weakness/vision changes/HA. Last night he noticed a thumping in his head but none today. Reports to the pharmacist he takes his medication as prescribed. He is not taking the apixaban yet as it is still at the pharmacy. Neither is he taking an aspirin daily. He lives alone. Appointment made for b/p tomorrow with PCP at 4:00p. Pharmacist will review symptoms which would need him to call 911 if occurred. Last b/p was 154/94 p.58.  Reason for Disposition . Systolic BP  >= 865 OR Diastolic >= 784  Additional Information . Commented on: Answer Assessment - Initial Assessment Questions    See encounter note.  Protocols used: HIGH BLOOD PRESSURE-A-AH

## 2018-07-14 ENCOUNTER — Other Ambulatory Visit (INDEPENDENT_AMBULATORY_CARE_PROVIDER_SITE_OTHER): Payer: Medicare Other

## 2018-07-14 ENCOUNTER — Ambulatory Visit (INDEPENDENT_AMBULATORY_CARE_PROVIDER_SITE_OTHER): Payer: Medicare Other | Admitting: Internal Medicine

## 2018-07-14 ENCOUNTER — Encounter: Payer: Self-pay | Admitting: Internal Medicine

## 2018-07-14 VITALS — BP 116/78 | HR 110 | Temp 97.7°F | Ht 66.5 in | Wt 167.0 lb

## 2018-07-14 DIAGNOSIS — I639 Cerebral infarction, unspecified: Secondary | ICD-10-CM

## 2018-07-14 DIAGNOSIS — R5383 Other fatigue: Secondary | ICD-10-CM

## 2018-07-14 DIAGNOSIS — N390 Urinary tract infection, site not specified: Secondary | ICD-10-CM

## 2018-07-14 DIAGNOSIS — R0989 Other specified symptoms and signs involving the circulatory and respiratory systems: Secondary | ICD-10-CM

## 2018-07-14 LAB — CBC WITH DIFFERENTIAL/PLATELET
BASOS ABS: 0 10*3/uL (ref 0.0–0.1)
Basophils Relative: 0.3 % (ref 0.0–3.0)
EOS PCT: 0.1 % (ref 0.0–5.0)
Eosinophils Absolute: 0 10*3/uL (ref 0.0–0.7)
HEMATOCRIT: 41.8 % (ref 39.0–52.0)
HEMOGLOBIN: 13.2 g/dL (ref 13.0–17.0)
LYMPHS PCT: 6.3 % — AB (ref 12.0–46.0)
Lymphs Abs: 0.6 10*3/uL — ABNORMAL LOW (ref 0.7–4.0)
MCHC: 31.5 g/dL (ref 30.0–36.0)
MCV: 69.9 fl — ABNORMAL LOW (ref 78.0–100.0)
Monocytes Absolute: 0.6 10*3/uL (ref 0.1–1.0)
Monocytes Relative: 6.3 % (ref 3.0–12.0)
NEUTROS ABS: 8.8 10*3/uL — AB (ref 1.4–7.7)
Neutrophils Relative %: 87 % — ABNORMAL HIGH (ref 43.0–77.0)
Platelets: 155 10*3/uL (ref 150.0–400.0)
RBC: 5.99 Mil/uL — AB (ref 4.22–5.81)
RDW: 15.8 % — ABNORMAL HIGH (ref 11.5–15.5)
WBC: 10.1 10*3/uL (ref 4.0–10.5)

## 2018-07-14 LAB — URINALYSIS, ROUTINE W REFLEX MICROSCOPIC
BILIRUBIN URINE: NEGATIVE
Ketones, ur: NEGATIVE
Nitrite: POSITIVE — AB
SPECIFIC GRAVITY, URINE: 1.02 (ref 1.000–1.030)
Total Protein, Urine: 30 — AB
URINE GLUCOSE: NEGATIVE
Urobilinogen, UA: 0.2 (ref 0.0–1.0)
pH: 5.5 (ref 5.0–8.0)

## 2018-07-14 LAB — BASIC METABOLIC PANEL
BUN: 21 mg/dL (ref 6–23)
CALCIUM: 9.3 mg/dL (ref 8.4–10.5)
CO2: 25 meq/L (ref 19–32)
Chloride: 104 mEq/L (ref 96–112)
Creatinine, Ser: 1.39 mg/dL (ref 0.40–1.50)
GFR: 63.81 mL/min (ref >60.00)
GLUCOSE: 102 mg/dL — AB (ref 70–99)
Potassium: 4 mEq/L (ref 3.5–5.1)
SODIUM: 137 meq/L (ref 135–145)

## 2018-07-14 LAB — HEPATIC FUNCTION PANEL
ALK PHOS: 63 U/L (ref 39–117)
ALT: 14 U/L (ref 0–53)
AST: 20 U/L (ref 0–37)
Albumin: 4 g/dL (ref 3.5–5.2)
Bilirubin, Direct: 0.1 mg/dL (ref 0.0–0.3)
TOTAL PROTEIN: 8.6 g/dL — AB (ref 6.0–8.3)
Total Bilirubin: 0.6 mg/dL (ref 0.2–1.2)

## 2018-07-14 LAB — TSH: TSH: 0.39 u[IU]/mL (ref 0.35–4.50)

## 2018-07-14 MED ORDER — LISINOPRIL 5 MG PO TABS: 5.0000 mg | ORAL_TABLET | Freq: Every day | ORAL | 3 refills | Status: DC

## 2018-07-14 MED ORDER — TAMSULOSIN HCL 0.4 MG PO CAPS: ORAL_CAPSULE | ORAL | 3 refills | Status: DC

## 2018-07-14 MED ORDER — POTASSIUM CHLORIDE CRYS ER 20 MEQ PO TBCR: 40.0000 meq | EXTENDED_RELEASE_TABLET | Freq: Every day | ORAL | 3 refills | Status: DC

## 2018-07-14 NOTE — Patient Instructions (Addendum)
Please continue all other medications as before, and refills have been done if requested.  Please have the pharmacy call with any other refills you may need.  Please continue your efforts at being more active, low cholesterol diet, and weight control.  You are otherwise up to date with prevention measures today.  Please keep your appointments with your specialists as you may have planned  Please go to the LAB in the Basement (turn left off the elevator) for the tests to be done today  You will be contacted by phone if any changes need to be made immediately.  Otherwise, you will receive a letter about your results with an explanation, but please check with MyChart first.  You will be contacted by phone if any changes need to be made immediately.  Otherwise, you will receive a letter about your results with an explanation, but please check with MyChart first.

## 2018-07-14 NOTE — Assessment & Plan Note (Signed)
Better today, ok to cont current meds,  to f/u any worsening symptoms or concerns

## 2018-07-14 NOTE — Progress Notes (Addendum)
Subjective:    Patient ID: [name], male    DOB: 06-09-1941, 77 y.o.   MRN: [id number]  HPI  Here with family friend, pt is poor historin - "I just dont feel good" but denies pain of any kind, dizziness or other except for general weakness and mild constipation.  Here after home nurse noted elevated BP; pt states he takes his BP meds, but Not taking several medications due to cost: gabpentin, apixaban, asa, albuterol MDI, and out of several that were [facility] Pt denies chest pain, increased sob or doe, wheezing, orthopnea, PND, increased LE swelling, palpitations, dizziness or syncope. Pt denies new neurological symptoms such as new headache, or facial or extremity weakness or numbness   Pt denies polydipsia, polyuria BP Readings from Last 3 Encounters:  07/14/18 116/78  07/06/18 126/84  06/26/18 110/76  . Wt Readings from Last 3 Encounters:  07/14/18 167 lb (75.8 kg)  07/06/18 170 lb (77.1 kg)  06/26/18 161 lb 1.6 oz (73.1 kg)   Past Medical History:  Diagnosis Date  . Anemia, iron deficiency 08/15/2011  . CAD (coronary artery disease)    cath in 1/08: EF 60%, mild dilated Ao root, oD2 30%, LAD 20-30%.  . Dilated aortic root ([place]) 08/15/2011  . Dyslipidemia   . Dysrhythmia   . History of MI (myocardial infarction) 08/15/2011  . History of pneumonia 08/15/2011  . History of stroke   . Hyperlipidemia   . Hypertension    echocardiogram 6/10: Moderate LVH, EF 55-65%, mild, mild MR, MAC, mild LAE, PASP 32  . Myocardial infarction ([place])   . Paroxysmal atrial fibrillation (HCC)   . Prediabetes 10/08/2015  . Preoperative cardiovascular examination 10/09/2015  . Shortness of breath dyspnea   . Stroke ([place]) 10/2011  . 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 CARDIAC CATHETERIZATION    . COLONOSCOPY    . ENDARTERECTOMY Right 10/10/2015   Procedure: ENDARTERECTOMY CAROTID-RIGHT;  Surgeon: Serafina Mitchell, MD;  Location: [facility] OR;  Service: Vascular;  Laterality: Right;  . IR RADIOLOGIST EVAL & MGMT  02/11/2017  . IR RADIOLOGIST EVAL & MGMT  03/24/2017    reports that he has never smoked. He has never used smokeless tobacco. He reports that he does not drink alcohol or use drugs. family history includes Aneurysm in his sister. No Known Allergies Current Outpatient Medications on File Prior to Visit  Medication Sig Dispense Refill  . apixaban (ELIQUIS) 5 MG TABS tablet Take 1 tablet (5 mg total) by mouth 2 (two) times daily. 60 tablet 0  . aspirin EC 81 MG tablet Take 1 tablet (81 mg total) by mouth daily. 30 tablet 0  . atorvastatin (LIPITOR) 80 MG tablet TAKE 1 TABLET BY MOUTH  DAILY 90 tablet 3  . carvedilol (COREG) 25 MG tablet TAKE 1 TABLET BY MOUTH TWO  TIMES DAILY 180 tablet 3  . ferrous sulfate 325 (65 FE) MG tablet Take 1 tablet (325 mg total) by mouth 2 (two) times daily with a meal. 60 tablet 0  . furosemide (LASIX) 20 MG tablet Take 1 tablet (20 mg total) by mouth daily. 180 tablet 3  . gabapentin (NEURONTIN) 300 MG capsule Take 1 capsule (300 mg total) by mouth at bedtime. 30 capsule 0  . isosorbide mononitrate (IMDUR) 60 MG 24 hr tablet TAKE 1 TABLET BY MOUTH  DAILY 90 tablet 3  . lisinopril (PRINIVIL,ZESTRIL) 5 MG tablet  Take 1 tablet (5 mg total) by mouth daily. 90 tablet 3  . potassium chloride SA (K-DUR,KLOR-CON) 20 MEQ tablet Take 2 tablets (40 mEq total) by mouth daily. 180 tablet 3  . PROAIR RESPICLICK 591 (90 Base) MCG/ACT AEPB Inhale 2 puffs into the lungs every 6 (six) hours as needed (for wheezing or shortness of breath). 1 each 0  . tamsulosin (FLOMAX) 0.4 MG CAPS capsule TAKE 1 CAPSULE BY MOUTH  DAILY AFTER SUPPER 90 capsule 3   No current facility-administered medications on file prior to visit.    Review of Systems  Constitutional: Negative for other unusual diaphoresis or sweats HENT: Negative for ear discharge or swelling Eyes: Negative for other worsening  visual disturbances Respiratory: Negative for stridor or other swelling  Gastrointestinal: Negative for worsening distension or other blood Genitourinary: Negative for retention or other urinary change Musculoskeletal: Negative for other MSK pain or swelling Skin: Negative for color change or other new lesions Neurological: Negative for worsening tremors and other numbness  Psychiatric/Behavioral: Negative for worsening agitation or other fatigue All other system neg per pt    Objective:   Physical Exam BP 116/78   Pulse (!) 110   Temp 97.7 F (36.5 C) (Oral)   Ht 5' 6.5" (1.689 m)   Wt 167 lb (75.8 kg)   SpO2 94%   BMI 26.55 kg/m  VS noted, faitgued but not ill appearing, is HOH Constitutional: Pt appears in NAD HENT: Head: NCAT.  Right Ear: External ear normal.  Left Ear: External ear normal.  Eyes: . Pupils are equal, round, and reactive to light. Conjunctivae and EOM are normal Nose: without d/c or deformity Neck: Neck supple. Gross normal ROM Cardiovascular: Normal rate and regular rhythm.   Pulmonary/Chest: Effort normal and breath sounds without rales or wheezing.  Abd:  Soft, NT, ND, + BS, no organomegaly Neurological: Pt is alert. At baseline orientation, motor grossly intact Skin: Skin is warm. No rashes, other new lesions, no LE edema Psychiatric: Pt behavior is normal without agitation  No other exam findings Lab Results  Component Value Date   WBC 6.1 06/25/2018   HGB 11.7 (L) 06/25/2018   HCT 38.9 (L) 06/25/2018   PLT 137 (L) 06/25/2018   GLUCOSE 103 (H) 06/26/2018   CHOL 247 (H) 01/27/2018   TRIG 152.0 (H) 01/27/2018   HDL 44.00 01/27/2018   LDLDIRECT 230.0 01/20/2013   LDLCALC 173 (H) 01/27/2018   ALT 13 06/23/2018   AST 19 06/23/2018   NA 136 06/26/2018   K 3.9 06/26/2018   CL 104 06/26/2018   CREATININE 1.27 (H) 06/26/2018   BUN 24 (H) 06/26/2018   CO2 25 06/26/2018   TSH 0.92 01/27/2018   PSA 1.43 09/16/2017   INR 1.35 04/04/2017   HGBA1C  6.5 01/27/2018       Assessment & Plan:

## 2018-07-14 NOTE — Assessment & Plan Note (Signed)
stable overall by history and exam, recent data reviewed with pt, and pt to continue medical treatment as before,  to f/u any worsening symptoms or concerns  

## 2018-07-14 NOTE — Assessment & Plan Note (Signed)
Etiology unclear, Exam otherwise benign, to check labs as documented, follow with expectant management  

## 2018-07-14 NOTE — Addendum Note (Signed)
Addended by: Biagio Borg on: 07/14/2018 11:58 AM   Modules accepted: Orders

## 2018-07-15 ENCOUNTER — Telehealth: Payer: Self-pay

## 2018-07-15 ENCOUNTER — Other Ambulatory Visit: Payer: Self-pay | Admitting: Internal Medicine

## 2018-07-15 ENCOUNTER — Encounter: Payer: Self-pay | Admitting: Internal Medicine

## 2018-07-15 ENCOUNTER — Other Ambulatory Visit: Payer: Self-pay | Admitting: *Deleted

## 2018-07-15 MED ORDER — SULFAMETHOXAZOLE-TRIMETHOPRIM 800-160 MG PO TABS: 1.0000 | ORAL_TABLET | Freq: Two times a day (BID) | ORAL | 0 refills | Status: DC

## 2018-07-15 NOTE — Telephone Encounter (Signed)
Pt has been informed of results and expressed understanding.  °

## 2018-07-15 NOTE — Telephone Encounter (Signed)
-----   Message from Biagio Borg, MD sent at 07/15/2018  8:09 AM EST ----- Letter sent, cont same tx except  The test results show that your current treatment is OK, except there is possible urinary tract infection.  We will send an antibiotic to the pharmacy, and you should be called from the office as well.[name] to please inform pt, I will do rx

## 2018-07-15 NOTE — Patient Outreach (Signed)
[place] [facility]) Care Management  07/15/2018  CALE BETHARD 09-28-1940 [id number]    Due to unsuccessful contacts and outreach attempts this case will be closed and involved team will be notified along with the primary provider of pt's disposition with [facility] services.   Raina Mina, RN Care Management Coordinator [place] Office [phone]

## 2018-07-16 LAB — URINE CULTURE
MICRO NUMBER:: 91489654
SPECIMEN QUALITY:: ADEQUATE

## 2018-07-18 ENCOUNTER — Other Ambulatory Visit: Payer: Self-pay | Admitting: Pharmacist

## 2018-07-18 ENCOUNTER — Ambulatory Visit: Payer: Self-pay | Admitting: Pharmacist

## 2018-07-18 NOTE — Patient Outreach (Signed)
[place] [facility]) Care Management  [place]  07/18/2018  DELNO BLAISDELL March 14, 1941 [id number]   Reason for call: F/u on medication adherence and blood pressure  Unsuccessful telephone call attempt #1 to patient and friend [name].  HIPAA compliant voicemail left requesting a return call.   Noted blood pressure much improved at PCP visit last week, no medication changes made at that time.   Plan:  I will make another outreach attempt to patient within 3-4 business days.   Ralene Bathe, PharmD, [place] [phone]

## 2018-07-19 ENCOUNTER — Other Ambulatory Visit (HOSPITAL_COMMUNITY): Payer: Self-pay | Admitting: Interventional Radiology

## 2018-07-19 ENCOUNTER — Other Ambulatory Visit: Payer: Self-pay

## 2018-07-19 DIAGNOSIS — I639 Cerebral infarction, unspecified: Secondary | ICD-10-CM

## 2018-07-19 NOTE — Patient Outreach (Signed)
[place] [facility]) Care Management  07/19/2018  [name] 03-15-1941 [id number]  Successful outreach to the patient on today's date, HIPAA identifiers confirmed. The patient is unable to confirm receipt of mailed resources. BSW to resend information to the patient. BSW discussed the patients transportation benefit under his [place]. BSW educated the patient on use of this benefit. The patient stated understanding.  Prior to ending today's call the patient reports concern with frequent urination especially at night. Upon chart review it is noted the patient had a recent antibiotic ordered for a urinary tract infection. The patient is able to confirm he has received the antibiotic and is taking as prescribed. BSW encouraged the patient to contact his physicians office to discuss concerns of continued frequency and urgency. The patient stated understanding.  Plan: BSW to outreach the patient within the next three weeks to confirm receipt of mailed resource. BSW explained to the patient that if assistance is needed prior to our next scheduled call he may call this BSW directly or the main office line. The patient stated understanding.  Daneen Schick, BSW, CDP [facility] [phone]

## 2018-07-21 ENCOUNTER — Other Ambulatory Visit: Payer: Self-pay | Admitting: Pharmacist

## 2018-07-21 ENCOUNTER — Ambulatory Visit: Payer: Self-pay | Admitting: Pharmacist

## 2018-07-21 NOTE — Patient Outreach (Signed)
[place] [facility]) Care Management  [place]  07/21/2018  [name] 1940/08/17 [id number]   Reason for call: f/u on medication adherence and blood pressure  Unsuccessful telephone call attempt #2 to patient.   HIPAA compliant voicemail left requesting a return call with both [name] and his friend, [name].   Plan:  I will mail patient an unsuccessful outreach letter, I will make another outreach attempt to patient within 3-4 business days   Ralene Bathe, PharmD, [place] [phone]

## 2018-07-26 ENCOUNTER — Ambulatory Visit: Payer: Self-pay | Admitting: Pharmacist

## 2018-07-26 ENCOUNTER — Other Ambulatory Visit: Payer: Self-pay | Admitting: Pharmacist

## 2018-07-26 NOTE — Patient Outreach (Signed)
[place] [facility]) Care Management  [place] 07/26/2018  [name] 1941-06-01 [id number]  Reason for call: f/u on blood pressure and medication management  3rd call attempt to [name] who answered the phone today.    Patient reports he has still not picked up his apixaban but plans to go to the pharmacy today and "will see what I can do."    He reports he is not checking his blood pressure regularly however states that "it is doing fine." He states he has not yet taken his morning medications.    Patient asks "when are you coming back to fill my pill box."    Patient reports that [name] is out of town right now and cannot help him with his medications.   Assessment:   Very poor medication adherene due to poor understanding of medication regimen and financial difficulty.    -Patient using [place] for most Tier 1 + 2 medications due to no charge however [facility] is not able to offer patient compliance packing to help with adherence.  Patient relies on his friend [name] to help with filling his pillbox but [name] has not been doing this regularly.    -Patient would greatly benefit from compliance packaging if he can afford to pay for his medications (Tier 1 = $2 / 30 day supply at retail pharmacy).  -Patient states he does not wish to start compliance packaging yet because a pharmacist at [facility] told him she would help fill his pillboxes if he brought in his medications.  Patient states he will go today to [facility] to pick up apixaban and have pillboxes filled.     We discussed the importance of taking medications on a consistent time schedule and to check blood pressure every day.  We again discussed the importance of apixaban for him.   Plan: F/u with patient in 1 month regarding medication adherence.   Ralene Bathe, PharmD, [place] [phone]

## 2018-08-04 ENCOUNTER — Encounter: Payer: Self-pay | Admitting: Internal Medicine

## 2018-08-08 ENCOUNTER — Telehealth (HOSPITAL_COMMUNITY): Payer: Self-pay

## 2018-08-08 ENCOUNTER — Other Ambulatory Visit: Payer: Self-pay

## 2018-08-08 ENCOUNTER — Ambulatory Visit (HOSPITAL_COMMUNITY)
Admission: RE | Admit: 2018-08-08 | Discharge: 2018-08-08 | Disposition: A | Discharge status: Home / Self Care | Payer: Medicare Other | Source: Ambulatory Visit | Attending: Interventional Radiology | Admitting: Interventional Radiology

## 2018-08-08 DIAGNOSIS — I639 Cerebral infarction, unspecified: Secondary | ICD-10-CM

## 2018-08-08 DIAGNOSIS — I6623 Occlusion and stenosis of bilateral posterior cerebral arteries: Secondary | ICD-10-CM | POA: Diagnosis not present

## 2018-08-08 DIAGNOSIS — I6502 Occlusion and stenosis of left vertebral artery: Secondary | ICD-10-CM | POA: Diagnosis not present
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
## 2018-08-08 NOTE — Telephone Encounter (Signed)
Pt came for consult and was very confused. He needs to go see his PCP for current urinary issues and leg pain. He is aware and I have called his friend [name] to let her know. She didn't answer, left vm for her to return call. AW

## 2018-08-08 NOTE — Patient Outreach (Signed)
[facility]) Care Management  08/08/2018  [name] [birth year] [id number]  Successful outreach to the patient, HIPAA identifiers confirmed. The patient is unable to confirm receipt of mailed resource and requests BSW send again. BSW has sent the resource twice in the last month. BSW attempted to provide the patient with the contact number to [employer] as the mailing is to give the patient instructions on how to use his [facility] transportation benefit. The patient denies having anything to write with and requests this BSW "call back". BSW will plan to contact the patient within the next two business days.  Daneen Schick, BSW, CDP [facility] [phone]

## 2018-08-09 ENCOUNTER — Telehealth: Payer: Self-pay

## 2018-08-09 DIAGNOSIS — I509 Heart failure, unspecified: Secondary | ICD-10-CM

## 2018-08-09 NOTE — Addendum Note (Signed)
Addended by: Biagio Borg on: 08/09/2018 12:43 PM   Modules accepted: Orders

## 2018-08-09 NOTE — Telephone Encounter (Signed)
[place] for referral cardiology

## 2018-08-09 NOTE — Telephone Encounter (Signed)
Copied from [place] [phone]. Topic: Referral - Request for Referral >> Aug 08, 2018  4:29 PM Windy Kalata wrote: Has patient seen PCP for this complaint? Yes If NO, is insurance requiring patient see PCP for this issue before PCP can refer them? Referral for which specialty: Water issues with legs Preferred provider/office: N/A Reason for referral: water issues with his legs

## 2018-08-10 ENCOUNTER — Other Ambulatory Visit: Payer: Self-pay

## 2018-08-10 NOTE — Patient Outreach (Signed)
[facility]) Care Management  08/10/2018  [name] [birth year] [id number]  Successful outreach to the patient on today's date, HIPAA identifiers confirmed. BSW discussed the reason for today's call, indicating this BSW had previously outreached him on Monday to provide a number for [employer] but the patient requested a return call due to not having a pen. The patient stated "I remember the call". BSW offered to provide the patient with the contact number to [employer] in which the patient responded he would need to look for a pen. "Be right back". BSW remained on the line for twelve minutes awaiting patients return. BSW repeatedly said "Mr. Bacha are you there" to which there was no response. BSW ended the call at the twelve minute mark.  BSW to perform a discipline closure on today's date due to an inability to provide resources to the patient. BSW has been active with the patient since November 25th at which time BSW confirmed the patients mailing address and  mailed information on [employer]. BSW placed a follow up call to the patient on December 17 in which the patient denied receipt of resource. BSW then verified the patients address and resent the information. BSW placed another follow up call to the patient on January 6th and the patient again denied receiving resource stating "send it again". BSW then offered to provide the patient with the contact information to his payor benefit for scheduling. The patient reported "I don't have anything to write with" and requested BSW return call later in the week. Today's call again was unsuccessful in BSW providing requested resource to the patient after multiple attempts from this writer.  Daneen Schick, BSW, CDP [facility] [phone]

## 2018-08-19 ENCOUNTER — Ambulatory Visit: Payer: Medicare Other | Admitting: Internal Medicine

## 2018-08-23 ENCOUNTER — Other Ambulatory Visit: Payer: Self-pay | Admitting: Pharmacist

## 2018-08-23 NOTE — Patient Outreach (Signed)
[facility]) Care Management  [place]  08/23/2018  JERIAN MORAIS 11-29-1940 [id number]   Reason for call: F/u on medication adherence  Unsuccessful telephone call attempt #1 to patient.   HIPAA compliant voicemail left requesting a return call  Plan:  I will make another outreach attempt to patient within 3-4 business days   Ralene Bathe, PharmD, [place] [phone]

## 2018-08-24 ENCOUNTER — Encounter: Payer: Self-pay | Admitting: Internal Medicine

## 2018-08-24 ENCOUNTER — Ambulatory Visit (INDEPENDENT_AMBULATORY_CARE_PROVIDER_SITE_OTHER): Payer: Medicare Other | Admitting: Internal Medicine

## 2018-08-24 VITALS — BP 146/88 | HR 68 | Temp 97.9°F | Ht 66.5 in | Wt 165.0 lb

## 2018-08-24 DIAGNOSIS — R351 Nocturia: Secondary | ICD-10-CM | POA: Diagnosis not present

## 2018-08-24 DIAGNOSIS — I10 Essential (primary) hypertension: Secondary | ICD-10-CM

## 2018-08-24 DIAGNOSIS — E876 Hypokalemia: Secondary | ICD-10-CM

## 2018-08-24 DIAGNOSIS — R7303 Prediabetes: Secondary | ICD-10-CM | POA: Diagnosis not present

## 2018-08-24 MED ORDER — POTASSIUM CHLORIDE ER 10 MEQ PO TBCR: 10.0000 meq | EXTENDED_RELEASE_TABLET | Freq: Every day | ORAL | 3 refills | Status: DC

## 2018-08-24 MED ORDER — OXYBUTYNIN CHLORIDE ER 5 MG PO TB24: 5.0000 mg | ORAL_TABLET | Freq: Every day | ORAL | 3 refills | Status: AC

## 2018-08-24 MED ORDER — ATORVASTATIN CALCIUM 80 MG PO TABS: 80.0000 mg | ORAL_TABLET | Freq: Every day | ORAL | 3 refills | Status: DC

## 2018-08-24 MED ORDER — FUROSEMIDE 20 MG PO TABS: 20.0000 mg | ORAL_TABLET | Freq: Every day | ORAL | 3 refills | Status: DC

## 2018-08-24 MED ORDER — ISOSORBIDE MONONITRATE ER 60 MG PO TB24: 60.0000 mg | ORAL_TABLET | Freq: Every day | ORAL | 3 refills | Status: DC

## 2018-08-24 MED ORDER — CARVEDILOL 25 MG PO TABS: 25.0000 mg | ORAL_TABLET | Freq: Two times a day (BID) | ORAL | 3 refills | Status: DC

## 2018-08-24 MED ORDER — TAMSULOSIN HCL 0.4 MG PO CAPS: ORAL_CAPSULE | ORAL | 3 refills | Status: DC

## 2018-08-24 MED ORDER — APIXABAN 5 MG PO TABS: 5.0000 mg | ORAL_TABLET | Freq: Two times a day (BID) | ORAL | 3 refills | Status: AC

## 2018-08-24 MED ORDER — FERROUS SULFATE 325 (65 FE) MG PO TABS: 325.0000 mg | ORAL_TABLET | Freq: Two times a day (BID) | ORAL | 0 refills | Status: AC

## 2018-08-24 MED ORDER — LISINOPRIL 5 MG PO TABS: 5.0000 mg | ORAL_TABLET | Freq: Every day | ORAL | 3 refills | Status: DC

## 2018-08-24 NOTE — Assessment & Plan Note (Signed)
Suspect he has not been taking the K, will change rx to 10 meq qd only, and will need f/u at next visit

## 2018-08-24 NOTE — Progress Notes (Signed)
Subjective:    Patient ID: [name], male    DOB: 06-07-1941, 78 y.o.   MRN: [id number]  HPI  Here to f/u; overall doing ok,  Pt denies chest pain, increasing sob or doe, wheezing, orthopnea, PND, increased LE swelling, palpitations, dizziness or syncope.  Pt denies new neurological symptoms such as new headache, or facial or extremity weakness or numbness.  Pt denies polydipsia, polyuria, or low sugar episode.  Pt states overall good compliance with meds, mostly trying to follow appropriate diet, with wt overall stable,  And has significant ongoing confusion about which meds to take, asks to have his med placed in his monthly pill box, but admits he did not bring all of his medications.  He does have FIVE bottles of Kdur 20 meq qd and seems obvious he is not taking this for some reason, as pills are so big maybe.  Also with urinary freq and nocturia for several months, without fever, pain or blood.   Past Medical History:  Diagnosis Date  . Anemia, iron deficiency 08/15/2011  . CAD (coronary artery disease)    cath in 1/08: EF 60%, mild dilated Ao root, oD2 30%, LAD 20-30%.  . Dilated aortic root ([place]) 08/15/2011  . Dyslipidemia   . Dysrhythmia   . History of MI (myocardial infarction) 08/15/2011  . History of pneumonia 08/15/2011  . History of stroke   . Hyperlipidemia   . Hypertension    echocardiogram 6/10: Moderate LVH, EF 55-65%, mild, mild MR, MAC, mild LAE, PASP 32  . Myocardial infarction ([place])   . Paroxysmal atrial fibrillation (HCC)   . Prediabetes 10/08/2015  . Preoperative cardiovascular examination 10/09/2015  . Shortness of breath dyspnea   . Stroke ([place]) 10/2011  . 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 CARDIAC CATHETERIZATION    . COLONOSCOPY    . ENDARTERECTOMY Right 10/10/2015   Procedure: ENDARTERECTOMY CAROTID-RIGHT;  Surgeon: Serafina Mitchell, MD;  Location: [facility] OR;  Service: Vascular;  Laterality:  Right;  . IR RADIOLOGIST EVAL & MGMT  02/11/2017  . IR RADIOLOGIST EVAL & MGMT  03/24/2017    reports that he has never smoked. He has never used smokeless tobacco. He reports that he does not drink alcohol or use drugs. family history includes Aneurysm in his sister. No Known Allergies Current Outpatient Medications on File Prior to Visit  Medication Sig Dispense Refill  . aspirin EC 81 MG tablet Take 1 tablet (81 mg total) by mouth daily. 30 tablet 0   No current facility-administered medications on file prior to visit.    Review of Systems  Constitutional: Negative for other unusual diaphoresis or sweats HENT: Negative for ear discharge or swelling Eyes: Negative for other worsening visual disturbances Respiratory: Negative for stridor or other swelling  Gastrointestinal: Negative for worsening distension or other blood Genitourinary: Negative for retention or other urinary change Musculoskeletal: Negative for other MSK pain or swelling Skin: Negative for color change or other new lesions Neurological: Negative for worsening tremors and other numbness  Psychiatric/Behavioral: Negative for worsening agitation or other fatigue All other system neg per pt    Objective:   Physical Exam BP (!) 146/88   Pulse 68   Temp 97.9 F (36.6 C) (Oral)   Ht 5' 6.5" (1.689 m)   Wt 165 lb (74.8 kg)   SpO2 93%   BMI 26.23 kg/m  VS noted,  Constitutional: Pt appears in NAD HENT:  Head: NCAT.  Right Ear: External ear normal.  Left Ear: External ear normal.  Eyes: . Pupils are equal, round, and reactive to light. Conjunctivae and EOM are normal Nose: without d/c or deformity Neck: Neck supple. Gross normal ROM Cardiovascular: Normal rate and regular rhythm.   Pulmonary/Chest: Effort normal and breath sounds without rales or wheezing.  Abd:  Soft, NT, ND, + BS, no organomegaly Neurological: Pt is alert. At baseline orientation, motor grossly intact Skin: Skin is warm. No rashes, other new  lesions, no LE edema Psychiatric: Pt behavior is normal without agitation  No other exam findings Lab Results  Component Value Date   WBC 10.1 07/14/2018   HGB 13.2 07/14/2018   HCT 41.8 07/14/2018   PLT 155.0 07/14/2018   GLUCOSE 102 (H) 07/14/2018   CHOL 247 (H) 01/27/2018   TRIG 152.0 (H) 01/27/2018   HDL 44.00 01/27/2018   LDLDIRECT 230.0 01/20/2013   LDLCALC 173 (H) 01/27/2018   ALT 14 07/14/2018   AST 20 07/14/2018   NA 137 07/14/2018   K 4.0 07/14/2018   CL 104 07/14/2018   CREATININE 1.39 07/14/2018   BUN 21 07/14/2018   CO2 25 07/14/2018   TSH 0.39 07/14/2018   PSA 1.43 09/16/2017   INR 1.35 04/04/2017   HGBA1C 6.5 01/27/2018   Declines further labs today     Assessment & Plan:

## 2018-08-24 NOTE — Assessment & Plan Note (Signed)
Mild elevated today not clear if he is taking all meds, o/w stable overall by history and exam, recent data reviewed with pt, and pt to continue medical treatment as before,  to f/u any worsening symptoms or concerns

## 2018-08-24 NOTE — Patient Instructions (Signed)
There is No need to take the medications in your bag with the "X" on them  I have refilled all the other medications  Please take all new medication as prescribed - the lower dose potassium pill, and the bladder medication  Please continue all other medications as before, and refills have been done if requested.  Please have the pharmacy call with any other refills you may need.  Please continue your efforts at being more active, low cholesterol diet, and weight control.  Please keep your appointments with your specialists as you may have planned  Please return in 3 months, or sooner if needed

## 2018-08-24 NOTE — Assessment & Plan Note (Signed)
Mild to mod, d/w pt - to continue the flomax qd, but also add oxybutinin ER 5 qd, declines urology referral

## 2018-08-24 NOTE — Assessment & Plan Note (Signed)
stable overall by history and exam, recent data reviewed with pt, and pt to continue medical treatment as before,  to f/u any worsening symptoms or concerns  

## 2018-08-26 ENCOUNTER — Other Ambulatory Visit: Payer: Self-pay | Admitting: Pharmacist

## 2018-08-26 NOTE — Patient Outreach (Signed)
[facility]) Care Management  [place]  08/26/2018  GLOYD HAPP Sep 12, 1940 [id number]   Reason for call: f/u on medication adherence  Unsuccessful telephone call attempt #2 to patient.   HIPAA compliant voicemail left requesting a return call  Plan:  I will make another outreach attempt to patient within 3-4 business days   Ralene Bathe, PharmD, [place] [phone]

## 2018-08-29 ENCOUNTER — Other Ambulatory Visit: Payer: Self-pay | Admitting: Pharmacist

## 2018-08-29 ENCOUNTER — Ambulatory Visit: Payer: Self-pay | Admitting: Pharmacist

## 2018-08-29 NOTE — Patient Outreach (Signed)
[place] [facility]) Care Management [place]  08/29/2018  [name] 09/02/1940 [id number]  Unsuccessful 3rd call attempt to Mr. Briana Newman regarding medication management.  HIPAA compliant voicemail left requesting a return call.   [facility] pharmacy case is being closed due to the following reasons:  We have been unable to establish and/or maintain contact with the patient. and The patient is non-adherent with the plan of care.  Patient has been provided [facility] CM contact information if assistance needed in the future.    Ralene Bathe, PharmD, [place] [phone]

## 2018-09-08 ENCOUNTER — Ambulatory Visit: Payer: Medicare Other | Admitting: Internal Medicine

## 2018-09-28 ENCOUNTER — Encounter: Payer: Self-pay | Admitting: Physician Assistant

## 2018-10-13 ENCOUNTER — Telehealth: Payer: Self-pay

## 2018-10-13 NOTE — Telephone Encounter (Signed)
Pt walked in and was questioning why he had an appt 10/18/18 at 1:30pm with Estella Husk PA...   I advised him that if he needs his meds refilled then the schedulers must have made him an appt for reassessment since he has not been seen since 2018 by Estella Husk... pt declined appt and says Dr. [name] is refilling all of his meds and he is doing fine... appt cancelled.  Addendum:  Pt decided prior to leaving that he would like to keep the appt and be seen since he is unsure about Dr. [name] making med changes often.Marland Kitchen appt remade for the pt.. he was very Patent attorney.

## 2018-10-18 ENCOUNTER — Ambulatory Visit: Payer: Medicare Other | Admitting: Physician Assistant
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
## 2018-10-19 ENCOUNTER — Encounter: Payer: Self-pay | Admitting: Physician Assistant

## 2018-10-26 ENCOUNTER — Telehealth: Payer: Self-pay | Admitting: Physician Assistant

## 2018-10-26 NOTE — Telephone Encounter (Signed)
Pt was called and informed he will get a return call with app.

## 2018-10-26 NOTE — Telephone Encounter (Signed)
Pt called [place] because his PCP informed him he missed an appointment with [place] to talk about medications on 03/17. Pt was vague with other details

## 2018-10-26 NOTE — Telephone Encounter (Signed)
Pt has been seen once by Ermalinda Barrios PA 04/27/2017 , pt was seen prior by Dr Lia Foyer and will be seen by Dr Meda Coffee. Pt has failed to show for appointments. He was to be seen 10/18/2018 pt called to cancel but called back to keep it due to necessity of refills, pt no showed. I will forward to [facility] LPN for Dr Meda Coffee to see when pt can be seen.

## 2019-03-08 ENCOUNTER — Other Ambulatory Visit (HOSPITAL_COMMUNITY): Payer: Self-pay | Admitting: Interventional Radiology

## 2019-03-08 DIAGNOSIS — I639 Cerebral infarction, unspecified: Secondary | ICD-10-CM

## 2019-03-29 ENCOUNTER — Other Ambulatory Visit: Payer: Self-pay

## 2019-03-29 NOTE — Patient Outreach (Signed)
[facility]) Care Management  03/29/2019  [name] [birth year] [id number]   Medication Adherence call to [name] HIPPA Compliant Voice message left with a call back number. [name] is showing past due on Lisinopril 5 mg and Atorvastatin 80 mg under [facility].  [place] Management Direct Dial [phone]  Fax [phone] [name]

## 2019-03-30 ENCOUNTER — Encounter (HOSPITAL_COMMUNITY): Payer: Self-pay

## 2019-03-30 ENCOUNTER — Ambulatory Visit (HOSPITAL_COMMUNITY): Payer: Medicare Other

## 2019-04-14 ENCOUNTER — Other Ambulatory Visit: Payer: Self-pay

## 2019-04-14 NOTE — Patient Outreach (Signed)
[place] [facility]) Care Management  04/14/2019  ANTOINO STAPF 1941-03-26 [id number]   Medication Adherence call to [place] Compliant Voice message left with a call back number. Patient is past due on Lisinopril 5 mg and Atorvastatin 80 mg under [place].  [place] Management Direct Dial [phone]  Fax [phone] [email or web address]

## 2019-04-27 ENCOUNTER — Telehealth (HOSPITAL_COMMUNITY): Payer: Self-pay

## 2019-04-27 NOTE — Telephone Encounter (Signed)
Called to reschedule mri, no answer, no vm. AW

## 2019-05-04 ENCOUNTER — Other Ambulatory Visit: Payer: Self-pay

## 2019-05-04 NOTE — Patient Outreach (Signed)
[place] [facility]) Care Management  05/04/2019  RAYNE TALAMO [birth year] [id number]   Medication Adherence call to Mr. Yoshinori Hux Telephone call to Patient regarding Medication Adherence unable to reach patient.Mr. Brisk is showing past due on Atorvastatin 80 mg and Lisinopril 50 mg under [place].   [place] Management Direct Dial [phone]  Fax [phone] [email or web address]

## 2019-06-21 ENCOUNTER — Telehealth (HOSPITAL_COMMUNITY): Payer: Self-pay

## 2019-06-21 NOTE — Telephone Encounter (Signed)
Called to reschedule mri, no answer, left vm. AW  

## 2019-08-21 ENCOUNTER — Ambulatory Visit: Payer: Medicare Other | Admitting: Internal Medicine

## 2019-08-21 DIAGNOSIS — Z0289 Encounter for other administrative examinations: Secondary | ICD-10-CM

## 2019-08-28 ENCOUNTER — Other Ambulatory Visit: Payer: Self-pay | Admitting: Internal Medicine

## 2019-08-28 NOTE — Telephone Encounter (Signed)
Please refill as per office routine med refill policy (all routine meds refilled for 3 mo or monthly per pt preference up to one year from last visit, then month to month grace period for 3 mo, then further med refills will have to be denied)  

## 2019-09-05 DIAGNOSIS — H40023 Open angle with borderline findings, high risk, bilateral: Secondary | ICD-10-CM | POA: Diagnosis not present

## 2019-09-05 DIAGNOSIS — H25813 Combined forms of age-related cataract, bilateral: Secondary | ICD-10-CM | POA: Diagnosis not present

## 2019-09-05 DIAGNOSIS — H35371 Puckering of macula, right eye: Secondary | ICD-10-CM | POA: Diagnosis not present

## 2019-09-15 DIAGNOSIS — H40023 Open angle with borderline findings, high risk, bilateral: Secondary | ICD-10-CM | POA: Diagnosis not present

## 2019-09-15 DIAGNOSIS — H35371 Puckering of macula, right eye: Secondary | ICD-10-CM | POA: Diagnosis not present

## 2019-09-15 DIAGNOSIS — H25813 Combined forms of age-related cataract, bilateral: Secondary | ICD-10-CM | POA: Diagnosis not present

## 2019-09-15 DIAGNOSIS — H47293 Other optic atrophy, bilateral: Secondary | ICD-10-CM | POA: Diagnosis not present

## 2019-09-27 DIAGNOSIS — H25813 Combined forms of age-related cataract, bilateral: Secondary | ICD-10-CM | POA: Diagnosis not present

## 2019-10-30 ENCOUNTER — Telehealth (HOSPITAL_COMMUNITY): Payer: Self-pay

## 2019-10-30 NOTE — Telephone Encounter (Signed)
Called to schedule mri, no answer, left vm. AW 

## 2019-11-17 ENCOUNTER — Other Ambulatory Visit: Payer: Self-pay

## 2019-11-17 ENCOUNTER — Ambulatory Visit (HOSPITAL_COMMUNITY)
Admission: RE | Admit: 2019-11-17 | Discharge: 2019-11-17 | Disposition: A | Discharge status: Home / Self Care | Payer: Medicare Other | Source: Ambulatory Visit | Attending: Interventional Radiology | Admitting: Interventional Radiology

## 2019-11-17 DIAGNOSIS — I63412 Cerebral infarction due to embolism of left middle cerebral artery: Secondary | ICD-10-CM | POA: Diagnosis not present

## 2019-11-17 DIAGNOSIS — I639 Cerebral infarction, unspecified: Secondary | ICD-10-CM | POA: Insufficient documentation

## 2019-11-17 DIAGNOSIS — I63533 Cerebral infarction due to unspecified occlusion or stenosis of bilateral posterior cerebral arteries: Secondary | ICD-10-CM | POA: Diagnosis not present

## 2019-11-17 DIAGNOSIS — I651 Occlusion and stenosis of basilar artery: Secondary | ICD-10-CM | POA: Diagnosis not present

## 2019-11-21 ENCOUNTER — Inpatient Hospital Stay (HOSPITAL_COMMUNITY)
Admission: EM | Admit: 2019-11-21 | Discharge: 2019-11-28 | DRG: 304 | Disposition: A | Discharge status: Skilled Nursing Facility | Payer: Medicare Other | Attending: Internal Medicine | Admitting: Internal Medicine

## 2019-11-21 ENCOUNTER — Encounter (HOSPITAL_COMMUNITY): Payer: Self-pay | Admitting: Emergency Medicine

## 2019-11-21 ENCOUNTER — Other Ambulatory Visit: Payer: Self-pay

## 2019-11-21 ENCOUNTER — Emergency Department (HOSPITAL_COMMUNITY): Payer: Medicare Other

## 2019-11-21 DIAGNOSIS — I130 Hypertensive heart and chronic kidney disease with heart failure and stage 1 through stage 4 chronic kidney disease, or unspecified chronic kidney disease: Secondary | ICD-10-CM | POA: Diagnosis not present

## 2019-11-21 DIAGNOSIS — E785 Hyperlipidemia, unspecified: Secondary | ICD-10-CM | POA: Diagnosis present

## 2019-11-21 DIAGNOSIS — N400 Enlarged prostate without lower urinary tract symptoms: Secondary | ICD-10-CM | POA: Diagnosis present

## 2019-11-21 DIAGNOSIS — I480 Paroxysmal atrial fibrillation: Secondary | ICD-10-CM | POA: Diagnosis not present

## 2019-11-21 DIAGNOSIS — R0902 Hypoxemia: Secondary | ICD-10-CM | POA: Diagnosis present

## 2019-11-21 DIAGNOSIS — I4891 Unspecified atrial fibrillation: Secondary | ICD-10-CM | POA: Diagnosis present

## 2019-11-21 DIAGNOSIS — R739 Hyperglycemia, unspecified: Secondary | ICD-10-CM | POA: Diagnosis not present

## 2019-11-21 DIAGNOSIS — N183 Chronic kidney disease, stage 3 unspecified: Secondary | ICD-10-CM | POA: Diagnosis present

## 2019-11-21 DIAGNOSIS — I771 Stricture of artery: Secondary | ICD-10-CM

## 2019-11-21 DIAGNOSIS — Z8701 Personal history of pneumonia (recurrent): Secondary | ICD-10-CM

## 2019-11-21 DIAGNOSIS — Z7401 Bed confinement status: Secondary | ICD-10-CM | POA: Diagnosis not present

## 2019-11-21 DIAGNOSIS — R0989 Other specified symptoms and signs involving the circulatory and respiratory systems: Secondary | ICD-10-CM | POA: Diagnosis not present

## 2019-11-21 DIAGNOSIS — I6529 Occlusion and stenosis of unspecified carotid artery: Secondary | ICD-10-CM | POA: Diagnosis present

## 2019-11-21 DIAGNOSIS — N3281 Overactive bladder: Secondary | ICD-10-CM | POA: Diagnosis not present

## 2019-11-21 DIAGNOSIS — I160 Hypertensive urgency: Secondary | ICD-10-CM | POA: Diagnosis present

## 2019-11-21 DIAGNOSIS — I4430 Unspecified atrioventricular block: Secondary | ICD-10-CM | POA: Diagnosis not present

## 2019-11-21 DIAGNOSIS — K5900 Constipation, unspecified: Secondary | ICD-10-CM | POA: Diagnosis not present

## 2019-11-21 DIAGNOSIS — I5043 Acute on chronic combined systolic (congestive) and diastolic (congestive) heart failure: Secondary | ICD-10-CM | POA: Diagnosis present

## 2019-11-21 DIAGNOSIS — I351 Nonrheumatic aortic (valve) insufficiency: Secondary | ICD-10-CM | POA: Diagnosis not present

## 2019-11-21 DIAGNOSIS — I10 Essential (primary) hypertension: Secondary | ICD-10-CM | POA: Diagnosis present

## 2019-11-21 DIAGNOSIS — Z79899 Other long term (current) drug therapy: Secondary | ICD-10-CM | POA: Diagnosis not present

## 2019-11-21 DIAGNOSIS — H2010 Chronic iridocyclitis, unspecified eye: Secondary | ICD-10-CM | POA: Diagnosis not present

## 2019-11-21 DIAGNOSIS — M2550 Pain in unspecified joint: Secondary | ICD-10-CM | POA: Diagnosis not present

## 2019-11-21 DIAGNOSIS — I639 Cerebral infarction, unspecified: Secondary | ICD-10-CM

## 2019-11-21 DIAGNOSIS — H9193 Unspecified hearing loss, bilateral: Secondary | ICD-10-CM | POA: Diagnosis not present

## 2019-11-21 DIAGNOSIS — Z9114 Patient's other noncompliance with medication regimen: Secondary | ICD-10-CM

## 2019-11-21 DIAGNOSIS — I161 Hypertensive emergency: Secondary | ICD-10-CM | POA: Diagnosis not present

## 2019-11-21 DIAGNOSIS — Z7982 Long term (current) use of aspirin: Secondary | ICD-10-CM | POA: Diagnosis not present

## 2019-11-21 DIAGNOSIS — R358 Other polyuria: Secondary | ICD-10-CM | POA: Diagnosis present

## 2019-11-21 DIAGNOSIS — E1122 Type 2 diabetes mellitus with diabetic chronic kidney disease: Secondary | ICD-10-CM | POA: Diagnosis not present

## 2019-11-21 DIAGNOSIS — I491 Atrial premature depolarization: Secondary | ICD-10-CM | POA: Diagnosis not present

## 2019-11-21 DIAGNOSIS — I440 Atrioventricular block, first degree: Secondary | ICD-10-CM | POA: Diagnosis not present

## 2019-11-21 DIAGNOSIS — R42 Dizziness and giddiness: Secondary | ICD-10-CM

## 2019-11-21 DIAGNOSIS — I252 Old myocardial infarction: Secondary | ICD-10-CM | POA: Diagnosis not present

## 2019-11-21 DIAGNOSIS — I2510 Atherosclerotic heart disease of native coronary artery without angina pectoris: Secondary | ICD-10-CM | POA: Diagnosis present

## 2019-11-21 DIAGNOSIS — I469 Cardiac arrest, cause unspecified: Secondary | ICD-10-CM | POA: Diagnosis not present

## 2019-11-21 DIAGNOSIS — I110 Hypertensive heart disease with heart failure: Secondary | ICD-10-CM | POA: Diagnosis present

## 2019-11-21 DIAGNOSIS — I350 Nonrheumatic aortic (valve) stenosis: Secondary | ICD-10-CM | POA: Diagnosis present

## 2019-11-21 DIAGNOSIS — R471 Dysarthria and anarthria: Secondary | ICD-10-CM | POA: Diagnosis present

## 2019-11-21 DIAGNOSIS — D509 Iron deficiency anemia, unspecified: Secondary | ICD-10-CM | POA: Diagnosis present

## 2019-11-21 DIAGNOSIS — R519 Headache, unspecified: Secondary | ICD-10-CM

## 2019-11-21 DIAGNOSIS — I69344 Monoplegia of lower limb following cerebral infarction affecting left non-dominant side: Secondary | ICD-10-CM

## 2019-11-21 DIAGNOSIS — J810 Acute pulmonary edema: Secondary | ICD-10-CM | POA: Diagnosis not present

## 2019-11-21 DIAGNOSIS — R0602 Shortness of breath: Secondary | ICD-10-CM | POA: Diagnosis not present

## 2019-11-21 DIAGNOSIS — R7303 Prediabetes: Secondary | ICD-10-CM | POA: Diagnosis not present

## 2019-11-21 DIAGNOSIS — Z20822 Contact with and (suspected) exposure to covid-19: Secondary | ICD-10-CM | POA: Diagnosis not present

## 2019-11-21 DIAGNOSIS — R4189 Other symptoms and signs involving cognitive functions and awareness: Secondary | ICD-10-CM | POA: Diagnosis present

## 2019-11-21 DIAGNOSIS — G2581 Restless legs syndrome: Secondary | ICD-10-CM | POA: Diagnosis present

## 2019-11-21 DIAGNOSIS — Z743 Need for continuous supervision: Secondary | ICD-10-CM | POA: Diagnosis not present

## 2019-11-21 LAB — COMPREHENSIVE METABOLIC PANEL
ALT: 17 U/L (ref 0–44)
AST: 24 U/L (ref 15–41)
Albumin: 3.4 g/dL — ABNORMAL LOW (ref 3.5–5.0)
Alkaline Phosphatase: 68 U/L (ref 38–126)
Anion gap: 10 (ref 5–15)
BUN: 21 mg/dL (ref 8–23)
CO2: 26 mmol/L (ref 22–32)
Calcium: 9.2 mg/dL (ref 8.9–10.3)
Chloride: 104 mmol/L (ref 98–111)
Creatinine, Ser: 1.25 mg/dL — ABNORMAL HIGH (ref 0.61–1.24)
GFR calc Af Amer: >60 mL/min (ref >60)
GFR calc non Af Amer: 55 mL/min — ABNORMAL LOW (ref >60)
Glucose, Bld: 92 mg/dL (ref 70–99)
Potassium: 4.3 mmol/L (ref 3.5–5.1)
Sodium: 140 mmol/L (ref 135–145)
Total Bilirubin: 0.7 mg/dL (ref 0.3–1.2)
Total Protein: 7.7 g/dL (ref 6.5–8.1)

## 2019-11-21 LAB — RESPIRATORY PANEL BY RT PCR (FLU A&B, COVID)
Influenza A by PCR: NEGATIVE
Influenza B by PCR: NEGATIVE
SARS Coronavirus 2 by RT PCR: NEGATIVE

## 2019-11-21 LAB — CBC
HCT: 38 % — ABNORMAL LOW (ref 39.0–52.0)
Hemoglobin: 11.2 g/dL — ABNORMAL LOW (ref 13.0–17.0)
MCH: 21.3 pg — ABNORMAL LOW (ref 26.0–34.0)
MCHC: 29.5 g/dL — ABNORMAL LOW (ref 30.0–36.0)
MCV: 72.4 fL — ABNORMAL LOW (ref 80.0–100.0)
Platelets: 145 10*3/uL — ABNORMAL LOW (ref 150–400)
RBC: 5.25 MIL/uL (ref 4.22–5.81)
RDW: 17.5 % — ABNORMAL HIGH (ref 11.5–15.5)
WBC: 4.5 10*3/uL (ref 4.0–10.5)
nRBC: 0 % (ref 0.0–0.2)

## 2019-11-21 LAB — BRAIN NATRIURETIC PEPTIDE: B Natriuretic Peptide: 319.9 pg/mL — ABNORMAL HIGH (ref 0.0–100.0)

## 2019-11-21 LAB — TROPONIN I (HIGH SENSITIVITY): Troponin I (High Sensitivity): 38 ng/L — ABNORMAL HIGH (ref <18)

## 2019-11-21 MED ORDER — ASPIRIN EC 81 MG PO TBEC
81.0000 mg | DELAYED_RELEASE_TABLET | Freq: Every day | ORAL | Status: DC
  Administered 2019-11-22 – 2019-11-26 (×5): 81 mg via ORAL
  Filled 2019-11-21 (×5): qty 1

## 2019-11-21 MED ORDER — ATORVASTATIN CALCIUM 80 MG PO TABS
80.0000 mg | ORAL_TABLET | Freq: Every day | ORAL | Status: DC
  Administered 2019-11-22 – 2019-11-28 (×7): 80 mg via ORAL
  Filled 2019-11-21 (×7): qty 1

## 2019-11-21 MED ORDER — ISOSORBIDE MONONITRATE ER 30 MG PO TB24
60.0000 mg | ORAL_TABLET | Freq: Every day | ORAL | Status: DC
  Administered 2019-11-21 – 2019-11-22 (×2): 60 mg via ORAL
  Filled 2019-11-21 (×2): qty 2

## 2019-11-21 MED ORDER — FUROSEMIDE 10 MG/ML IJ SOLN
40.0000 mg | Freq: Once | INTRAMUSCULAR | Status: AC
  Administered 2019-11-21: 21:00:00 40 mg via INTRAVENOUS
  Filled 2019-11-21: qty 4

## 2019-11-21 MED ORDER — NITROGLYCERIN 2 % TD OINT
0.5000 [in_us] | TOPICAL_OINTMENT | Freq: Once | TRANSDERMAL | Status: AC
  Administered 2019-11-21: 19:00:00 0.5 [in_us] via TOPICAL
  Filled 2019-11-21: qty 1

## 2019-11-21 MED ORDER — ENOXAPARIN SODIUM 40 MG/0.4ML ~~LOC~~ SOLN
40.0000 mg | Freq: Every day | SUBCUTANEOUS | Status: DC
  Administered 2019-11-22: 11:00:00 40 mg via SUBCUTANEOUS
  Filled 2019-11-21: qty 0.4

## 2019-11-21 MED ORDER — LABETALOL HCL 5 MG/ML IV SOLN
20.0000 mg | Freq: Once | INTRAVENOUS | Status: AC
  Administered 2019-11-21: 19:00:00 20 mg via INTRAVENOUS
  Filled 2019-11-21: qty 4

## 2019-11-21 MED ORDER — SODIUM CHLORIDE 0.9% FLUSH
3.0000 mL | Freq: Two times a day (BID) | INTRAVENOUS | Status: DC
  Administered 2019-11-22 – 2019-11-28 (×13): 3 mL via INTRAVENOUS

## 2019-11-21 MED ORDER — NITROGLYCERIN IN D5W 200-5 MCG/ML-% IV SOLN
0.0000 ug/min | INTRAVENOUS | Status: DC
  Administered 2019-11-21: 21:00:00 10 ug/min via INTRAVENOUS
  Administered 2019-11-22: 13:00:00 60 ug/min via INTRAVENOUS
  Filled 2019-11-21 (×2): qty 250

## 2019-11-21 MED ORDER — CARVEDILOL 25 MG PO TABS
25.0000 mg | ORAL_TABLET | Freq: Two times a day (BID) | ORAL | Status: DC
  Administered 2019-11-21 – 2019-11-24 (×6): 25 mg via ORAL
  Filled 2019-11-21 (×2): qty 2
  Filled 2019-11-21 (×2): qty 1
  Filled 2019-11-21 (×2): qty 2

## 2019-11-21 MED ORDER — LISINOPRIL 5 MG PO TABS
5.0000 mg | ORAL_TABLET | Freq: Every day | ORAL | Status: DC
  Administered 2019-11-21 – 2019-11-28 (×8): 5 mg via ORAL
  Filled 2019-11-21 (×8): qty 1

## 2019-11-21 NOTE — Hospital Course (Signed)
[name] is a 79 yo AA male with PMH CVA (residual LLE weakness and dysarthria), HTN (medication noncompliance 2/2 financial constraints), CAD, CAS (s/p right CEA 2017), HLD, combined systolic diastolic CHF (EF [id number], GrII DD on 2018 echo) who presents to the ER with nausea, mild anxiety/irritation, "breathing hard", and a cough that is nonproductive. He states that he has been "out of money" for approximately 1 month and has not taken any of his medications.  He was last seen by his PCP January 2020 and states that since he "moved" he has not been able to follow-up since.  There have also been multiple patient out reaches whereby the patient has been unable to be contacted as he was due for a follow-up MRI brain in relation to his strokes in the past. On presentation in the ER, he mostly notes some mild nausea but no vomiting.  He denies any headaches, eye pain/pressure, chest pain, or change in his baseline neurologic deficits from his previous stroke which include residual left lower extremity weakness and dysarthria.  He denies any numbness or tingling.  Initial blood pressure in the ER noted to be 184/96 which then progressively climbed into the low 200s over low 100s (239/143).  Nitroglycerin paste was applied and he was also given Imdur 60 mg, labetalol IV 20 mg, and lisinopril 5 mg.  Blood pressure was unresponsive to this treatment and a nitroglycerin infusion was then ordered while in the ER. He had no neuro deficits, therefore no brain imaging was ordered, but low threshold if he does develop headache or neurologic impairment. CXR was obtained while in the ER which was notable for mild interstitial edema and small bilateral pleural effusions. Notable labs include: WBC 4.5, hemoglobin 11.2, hematocrit 38, MCV 72, platelets 145 BNP 319 Na 140, K 4.3, Creat 1.25, BUN 21 Troponin 38  He was admitted to the stepdown unit for ongoing hypertensive urgency treatment.  He will also need evaluation  by physical therapy and assistance from social work for transitioning back to PCP upon discharge.

## 2019-11-21 NOTE — ED Provider Notes (Signed)
[facility] EMERGENCY DEPARTMENT Provider Note   CSN: [id number] Arrival date & time: 11/21/19  5361     History Chief Complaint  Patient presents with  . Shortness of Breath    [name] is a 79 y.o. male.  HPI   Presents with dyspnea and fatigue.  Patient has multiple medical issues, has not been taking his medication for about 1 month.  Is unclear why the patient stopped taking it, but he does complain of difficulty with polyuria when he is taking his medication regularly. Now presents due to increasing abdominal size, dyspnea, and fatigue.  It seems as though over the past 2 or 3 days symptoms have become more pronounced, prompting today's evaluation.  He denies fever, denies swelling in his lower extremities.    Past Medical History:  Diagnosis Date  . Anemia, iron deficiency 08/15/2011  . CAD (coronary artery disease)    cath in 1/08: EF 60%, mild dilated Ao root, oD2 30%, LAD 20-30%.  . Dilated aortic root ([place]) 08/15/2011  . Dyslipidemia   . Dysrhythmia   . History of MI (myocardial infarction) 08/15/2011  . History of pneumonia 08/15/2011  . History of stroke   . Hyperlipidemia   . Hypertension    echocardiogram 6/10: Moderate LVH, EF 55-65%, mild, mild MR, MAC, mild LAE, PASP 32  . Myocardial infarction ([place])   . Paroxysmal atrial fibrillation (HCC)   . Prediabetes 10/08/2015  . Preoperative cardiovascular examination 10/09/2015  . Shortness of breath dyspnea   . Stroke ([place]) 10/2011  . Systolic congestive heart failure ([place]) 01/13/2017    Patient Active Problem List   Diagnosis Date Noted  . Hypokalemia 08/24/2018  . Nocturia more than twice per night 08/24/2018  . Fatigue 07/14/2018  . Acute on chronic combined systolic and diastolic congestive heart failure ([place]) 06/24/2018  . Thrombocytopenia ([place]) 06/23/2018  . Neuropathy 06/23/2018  . Vitamin D deficiency 01/27/2018  . Chest pain 09/18/2017  . RLS (restless legs syndrome)  09/16/2017  . Chronic combined systolic and diastolic CHF (congestive heart failure) ([place]) 04/27/2017  . 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 Cardiomegaly 04/03/2017  . History of stroke 04/03/2017  . Hypertensive heart disease with congestive heart failure ([place]) 04/03/2017  . Gait disorder 01/28/2017  . Acute on chronic congestive heart failure ([place]) 01/13/2017  . Insomnia 12/06/2016  . Left-sided weakness 12/03/2016  . Chronic anticoagulation 12/10/2015  . Sepsis secondary to UTI ([place]) 11/05/2015  . Lower urinary tract infectious disease 11/05/2015  . CAD in native artery 11/05/2015  . Left hemiplegia ([place]) 11/05/2015  . Sinus tachycardia   . 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 Labile blood pressure   . CVA (cerebral vascular accident) ([place])   . AF (paroxysmal atrial fibrillation) ([place])   . Carotid artery stenosis   . S/P carotid endarterectomy   . Coronary artery disease involving native coronary artery of native heart without angina pectoris   . HLD (hyperlipidemia)   . Acute blood loss anemia   . Near syncope   . Prediabetes 10/08/2015  . Right cavernous carotid stenosis   . Carotid stenosis   . Acute ischemic stroke ([place]) 10/07/2015  . Acute chest pain 06/19/2015  . DOE (dyspnea on exertion) 06/19/2015  . Hypertensive urgency 06/19/2015  . Bilateral arm pain 06/26/2014  . 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 Impingement syndrome of left shoulder 01/20/2013  .  Atrial fibrillation ([place]) 04/18/2012  . Leg pain 01/28/2012  . Dizziness and giddiness 11/19/2011  . Dehydration 11/19/2011  . CKD (chronic kidney disease), stage III 11/19/2011  . HTN (hypertension) 11/19/2011  . Dyslipidemia 11/19/2011  . Chronic anemia 11/19/2011  . Erectile dysfunction 08/20/2011  . Nocturia 08/20/2011  . Encounter for preventative adult health care exam with abnormal findings 08/15/2011  .  Anemia, iron deficiency 08/15/2011  . History of pneumonia 08/15/2011  . CAD (coronary artery disease) 08/15/2011  . History of MI (myocardial infarction) 08/15/2011  . Dilated aortic root ([place]) 08/15/2011  . Syncope 07/16/2011  . Claudication ([place]) 07/16/2011  . BPH (benign prostatic hyperplasia) 07/16/2011  . Rash 04/29/2011  . Cerebral infarction ([place]) 03/31/2011  . Inguinal hernia 03/31/2011    Past Surgical History:  Procedure Laterality Date  . CARDIAC CATHETERIZATION    . COLONOSCOPY    . ENDARTERECTOMY Right 10/10/2015   Procedure: ENDARTERECTOMY CAROTID-RIGHT;  Surgeon: Serafina Mitchell, MD;  Location: [facility] OR;  Service: Vascular;  Laterality: Right;  . IR RADIOLOGIST EVAL & MGMT  02/11/2017  . IR RADIOLOGIST EVAL & MGMT  03/24/2017       Family History  Problem Relation Age of Onset  . Aneurysm Sister   . Colon cancer Neg Hx     Social History   Tobacco Use  . Smoking status: Never Smoker  . Smokeless tobacco: Never Used  Substance Use Topics  . Alcohol use: No  . Drug use: No    Home Medications Prior to Admission medications   Medication Sig Start Date End Date Taking? Authorizing Provider  apixaban (ELIQUIS) 5 MG TABS tablet Take 1 tablet (5 mg total) by mouth 2 (two) times daily for 30 days. 08/24/18 11/21/19 Yes Biagio Borg, MD  aspirin EC 81 MG tablet Take 1 tablet (81 mg total) by mouth daily. 06/26/18  Yes Donne Hazel, MD  atorvastatin (LIPITOR) 80 MG tablet Take 1 tablet by mouth once daily 08/28/19  Yes Biagio Borg, MD  carvedilol (COREG) 25 MG tablet Take 1 tablet by mouth twice daily 08/28/19   Biagio Borg, MD  ferrous sulfate 325 (65 FE) MG tablet Take 1 tablet (325 mg total) by mouth 2 (two) times daily with a meal for 30 days. 08/24/18 09/23/18  Biagio Borg, MD  furosemide (LASIX) 20 MG tablet Take 1 tablet (20 mg total) by mouth daily. 08/28/19   Biagio Borg, MD  isosorbide mononitrate (IMDUR) 60 MG 24 hr tablet Take 1 tablet (60 mg total)  by mouth daily. 08/24/18   Biagio Borg, MD  lisinopril (ZESTRIL) 5 MG tablet Take 1 tablet (5 mg total) by mouth daily. 08/28/19   Biagio Borg, MD  oxybutynin (DITROPAN-XL) 5 MG 24 hr tablet Take 1 tablet (5 mg total) by mouth at bedtime. 08/24/18   Biagio Borg, MD  potassium chloride (KLOR-CON 10) 10 MEQ tablet Take 1 tablet (10 mEq total) by mouth daily. 08/24/18   Biagio Borg, MD  tamsulosin (FLOMAX) 0.4 MG CAPS capsule Take 1 capsule (0.4 mg total) by mouth daily after supper. 08/28/19   Biagio Borg, MD    Allergies    Patient has no known allergies.  Review of Systems   Review of Systems  Constitutional:       Per HPI, otherwise negative  HENT:       Per HPI, otherwise negative  Respiratory:       Per HPI, otherwise  negative  Cardiovascular:       Per HPI, otherwise negative  Gastrointestinal: Negative for vomiting.  Endocrine:       Negative aside from HPI  Genitourinary:       Neg aside from HPI   Musculoskeletal:       Per HPI, otherwise negative  Skin: Negative.   Neurological: Negative for syncope.    Physical Exam Updated Vital Signs BP (!) 191/129   Pulse 62   Temp 98.6 F (37 C) (Oral)   Resp 16   SpO2 97%   Physical Exam Vitals and nursing note reviewed.  Constitutional:      General: He is not in acute distress.    Appearance: He is well-developed.  HENT:     Head: Normocephalic and atraumatic.  Eyes:     Conjunctiva/sclera: Conjunctivae normal.  Cardiovascular:     Rate and Rhythm: Normal rate and regular rhythm.     Heart sounds: Murmur present.  Pulmonary:     Effort: Pulmonary effort is normal. No respiratory distress.     Breath sounds: No stridor. No wheezing or rhonchi.  Abdominal:     General: There is no distension.  Musculoskeletal:     Right lower leg: Edema present.     Left lower leg: Edema present.  Skin:    General: Skin is warm and dry.  Neurological:     Mental Status: He is alert and oriented to person, place, and  time.      ED Results / Procedures / Treatments   Labs (all labs ordered are listed, but only abnormal results are displayed) Labs Reviewed  CBC - Abnormal; Notable for the following components:      Result Value   Hemoglobin 11.2 (*)    HCT 38.0 (*)    MCV 72.4 (*)    MCH 21.3 (*)    MCHC 29.5 (*)    RDW 17.5 (*)    Platelets 145 (*)    All other components within normal limits  BRAIN NATRIURETIC PEPTIDE - Abnormal; Notable for the following components:   B Natriuretic Peptide 319.9 (*)    All other components within normal limits  COMPREHENSIVE METABOLIC PANEL  TROPONIN I (HIGH SENSITIVITY)  TROPONIN I (HIGH SENSITIVITY)    EKG EKG Interpretation  Date/Time:  Tuesday November 21 2019 10:08:30 EDT Ventricular Rate:  67 PR Interval:  202 QRS Duration: 96 QT Interval:  448 QTC Calculation: 473 R Axis:   29 Text Interpretation: Sinus rhythm with Premature atrial complexes Left ventricular hypertrophy with repolarization abnormality ( Sokolow-Lyon ) T wave abnormality Artifact Abnormal ECG Confirmed by Carmin Muskrat [phone]) on 11/21/2019 4:48:46 PM   Radiology DG Chest 2 View  Result Date: 11/21/2019 CLINICAL DATA:  Shortness of breath EXAM: CHEST - 2 VIEW COMPARISON:  04/03/2017 FINDINGS: Stable cardiomegaly. Aortic calcified and tortuous. Diffuse interstitial prominence. Patchy bibasilar opacities. Trace bilateral pleural effusions. No pneumothorax is seen, although lung apices are obscured. Chronic fusion of the T9 and T10 vertebral bodies. IMPRESSION: Findings suggestive of CHF with pulmonary edema and trace bilateral pleural effusions. Electronically Signed   By: Davina Poke D.O.   On: 11/21/2019 11:21    Procedures Procedures (including critical care time)  CRITICAL CARE Performed by: Carmin Muskrat Total critical care time: 35 minutes Critical care time was exclusive of separately billable procedures and treating other patients. Critical care was necessary  to treat or prevent imminent or life-threatening deterioration. Critical care was time spent personally by me  on the following activities: development of treatment plan with patient and/or surrogate as well as nursing, discussions with consultants, evaluation of patient's response to treatment, examination of patient, obtaining history from patient or surrogate, ordering and performing treatments and interventions, ordering and review of laboratory studies, ordering and review of radiographic studies, pulse oximetry and re-evaluation of patient's condition.   Medications Ordered in ED Medications  carvedilol (COREG) tablet 25 mg (has no administration in time range)  isosorbide mononitrate (IMDUR) 24 hr tablet 60 mg (has no administration in time range)  lisinopril (ZESTRIL) tablet 5 mg (has no administration in time range)  nitroGLYCERIN (NITROGLYN) 2 % ointment 0.5 inch (has no administration in time range)  labetalol (NORMODYNE) injection 20 mg (has no administration in time range)    ED Course  I have reviewed the triage vital signs and the nursing notes.  Pertinent labs & imaging results that were available during my care of the patient were reviewed by me and considered in my medical decision making (see chart for details).   On arrival the patient's blood pressure substantially elevated, with systolic greater than 601, diastolic greater than 093.  Patient provided his home medication regimen including nitrate, Coreg, lisinopril and nitroglycerin paste.   7:57 PM Patient remains hypertensive, with a diastolic greater than 235 in spite of multiple medications, Nitropaste.  With this, patient will start nitro drip, will require admission. He has received, in addition to his antihypertensives, Nitropaste, Lasix, IV.  This elderly male with history of medication noncompliance, in the context of substantial other issues including congestive heart failure, diabetes, hypertension now presents  with dyspnea, is found to be critically hypertensive on arrival.  Though there is some small reduction in his blood pressure while in the emergency department, given persistent elevated levels, pulmonary congestion, patient required IV diuretics, IV nitroglycerin drip, and admission for further monitoring, management.  MDM Number of Diagnoses or Management Options Hypertensive emergency: established, worsening Pulmonary congestion: established, worsening   Amount and/or Complexity of Data Reviewed Clinical lab tests: reviewed Tests in the radiology section of CPT: reviewed Tests in the medicine section of CPT: reviewed Discussion of test results with the performing providers: yes Decide to obtain previous medical records or to obtain history from someone other than the patient: yes Review and summarize past medical records: yes Discuss the patient with other providers: yes Independent visualization of images, tracings, or specimens: yes  Risk of Complications, Morbidity, and/or Mortality Presenting problems: high Diagnostic procedures: high Management options: high  Critical Care Total time providing critical care: 30-74 minutes  Patient Progress Patient progress: improved  Final Clinical Impression(s) / ED Diagnoses Final diagnoses:  Hypertensive emergency  Pulmonary congestion     Carmin Muskrat, MD 11/21/19 [phone]

## 2019-11-21 NOTE — ED Notes (Addendum)
x

## 2019-11-21 NOTE — ED Triage Notes (Signed)
Pt arrives via EMS from home with SOB since last night. Denies CP. Wheezing noted in right side. 200/120 but noncompliant with HTN meds.

## 2019-11-21 NOTE — ED Notes (Signed)
Pt st's he has not taken his blood pressure in over a month

## 2019-11-21 NOTE — H&P (Signed)
History and Physical    [name] YBO:[id number] DOB: 21-Aug-1940 DOA: 11/21/2019  PCP: Biagio Borg, MD Patient coming from: home  I have personally briefly reviewed patient's old medical records in [name]  Chief Complaint: nausea, cough, out of meds  HPI: [name] is a 79 yo AA male with PMH CVA (residual LLE weakness and dysarthria), HTN (medication noncompliance 2/2 financial constraints), CAD, CAS (s/p right CEA 2017), HLD, combined systolic diastolic CHF (EF 27-78%, GrII DD on 2018 echo) who presents to the ER with nausea, mild anxiety/irritation, "breathing hard", and a cough that is nonproductive. He states that he has been "out of money" for approximately 1 month and has not taken any of his medications.  He was last seen by his PCP January 2020 and states that since he "moved" he has not been able to follow-up since.  There have also been multiple patient out reaches whereby the patient has been unable to be contacted as he was due for a follow-up MRI brain in relation to his strokes in the past. On presentation in the ER, he mostly notes some mild nausea but no vomiting.  He denies any headaches, eye pain/pressure, chest pain, or change in his baseline neurologic deficits from his previous stroke which include residual left lower extremity weakness and dysarthria.  He denies any numbness or tingling.  Initial blood pressure in the ER noted to be 184/96 which then progressively climbed into the low 200s over low 100s (239/143).  Nitroglycerin paste was applied and he was also given Imdur 60 mg, labetalol IV 20 mg, and lisinopril 5 mg.  Blood pressure was unresponsive to this treatment and a nitroglycerin infusion was then ordered while in the ER. He had no neuro deficits, therefore no brain imaging was ordered, but low threshold if he does develop headache or neurologic impairment. CXR was obtained while in the ER which was notable for mild interstitial edema and small  bilateral pleural effusions. Notable labs include: WBC 4.5, hemoglobin 11.2, hematocrit 38, MCV 72, platelets 145 BNP 319 Na 140, K 4.3, Creat 1.25, BUN 21 Troponin 38  He was admitted to the stepdown unit for ongoing hypertensive urgency treatment.  He will also need evaluation by physical therapy and assistance from social work for transitioning back to PCP upon discharge.   ED Course: imdur, labetalol, lisinopril, NTG paste; then NTG drip  Review of Systems: As per HPI otherwise 10 point review of systems negative.   Past Medical History:  Diagnosis Date  . Anemia, iron deficiency 08/15/2011  . CAD (coronary artery disease)    cath in 1/08: EF 60%, mild dilated Ao root, oD2 30%, LAD 20-30%.  . Dilated aortic root ([place]) 08/15/2011  . Dyslipidemia   . Dysrhythmia   . History of MI (myocardial infarction) 08/15/2011  . History of pneumonia 08/15/2011  . History of stroke   . Hyperlipidemia   . Hypertension    echocardiogram 6/10: Moderate LVH, EF 55-65%, mild, mild MR, MAC, mild LAE, PASP 32  . Myocardial infarction ([place])   . Paroxysmal atrial fibrillation (HCC)   . Prediabetes 10/08/2015  . Preoperative cardiovascular examination 10/09/2015  . Shortness of breath dyspnea   . Stroke ([place]) 10/2011  . Systolic congestive heart failure ([place]) 01/13/2017    Past Surgical History:  Procedure Laterality Date  . CARDIAC CATHETERIZATION    . COLONOSCOPY    . ENDARTERECTOMY Right 10/10/2015   Procedure: ENDARTERECTOMY CAROTID-RIGHT;  Surgeon: Serafina Mitchell, MD;  Location: [facility] OR;  Service: Vascular;  Laterality: Right;  . IR RADIOLOGIST EVAL & MGMT  02/11/2017  . IR RADIOLOGIST EVAL & MGMT  03/24/2017     reports that he has never smoked. He has never used smokeless tobacco. He reports that he does not drink alcohol or use drugs.  No Known Allergies  Family History  Problem Relation Age of Onset  . Aneurysm Sister   . Colon cancer Neg Hx     Prior to Admission medications     Medication Sig Start Date End Date Taking? Authorizing Provider  apixaban (ELIQUIS) 5 MG TABS tablet Take 1 tablet (5 mg total) by mouth 2 (two) times daily for 30 days. 08/24/18 11/21/19 Yes Biagio Borg, MD  aspirin EC 81 MG tablet Take 1 tablet (81 mg total) by mouth daily. 06/26/18  Yes Donne Hazel, MD  atorvastatin (LIPITOR) 80 MG tablet Take 1 tablet by mouth once daily 08/28/19  Yes Biagio Borg, MD  carvedilol (COREG) 25 MG tablet Take 1 tablet by mouth twice daily 08/28/19   Biagio Borg, MD  ferrous sulfate 325 (65 FE) MG tablet Take 1 tablet (325 mg total) by mouth 2 (two) times daily with a meal for 30 days. 08/24/18 09/23/18  Biagio Borg, MD  furosemide (LASIX) 20 MG tablet Take 1 tablet (20 mg total) by mouth daily. 08/28/19   Biagio Borg, MD  isosorbide mononitrate (IMDUR) 60 MG 24 hr tablet Take 1 tablet (60 mg total) by mouth daily. 08/24/18   Biagio Borg, MD  lisinopril (ZESTRIL) 5 MG tablet Take 1 tablet (5 mg total) by mouth daily. 08/28/19   Biagio Borg, MD  oxybutynin (DITROPAN-XL) 5 MG 24 hr tablet Take 1 tablet (5 mg total) by mouth at bedtime. 08/24/18   Biagio Borg, MD  potassium chloride (KLOR-CON 10) 10 MEQ tablet Take 1 tablet (10 mEq total) by mouth daily. 08/24/18   Biagio Borg, MD  tamsulosin (FLOMAX) 0.4 MG CAPS capsule Take 1 capsule (0.4 mg total) by mouth daily after supper. 08/28/19   Biagio Borg, MD    Physical Exam: Vitals:   11/21/19 2045 11/21/19 2100 11/21/19 2115 11/21/19 2130  BP: (!) 213/162 (!) 198/102 (!) 183/123 (!) 201/109  Pulse:      Resp: _0 (!) 26  Temp:      TempSrc:      SpO2:        General appearance: alert, cooperative, no distress and dysarthia appreciated Head: Normocephalic, without obvious abnormality, atraumatic Eyes: EOMI Lungs: scattered crackles bibasilar Heart: regular rate and rhythm and S1, S2 normal Abdomen: normal findings: bowel sounds normal and soft, non-tender Extremities: no LE edema Skin:  mobility and turgor normal Neurologic: 4/5 LLE strength, 5/5 bilateral UE strength; no appreciated paresthesias; dysarthria appreciated Psych: normal affect, normal thought process  Labs on Admission: I have personally reviewed following labs and imaging studies  CBC: Recent Labs  Lab 11/21/19 1102  WBC 4.5  HGB 11.2*  HCT 38.0*  MCV 72.4*  PLT 654*   Basic Metabolic Panel: Recent Labs  Lab 11/21/19 1844  NA 140  K 4.3  CL 104  CO2 26  GLUCOSE 92  BUN 21  CREATININE 1.25*  CALCIUM 9.2   GFR: CrCl cannot be calculated (Unknown ideal weight.). Liver Function Tests: Recent Labs  Lab 11/21/19 1844  AST 24  ALT 17  ALKPHOS 68  BILITOT 0.7  PROT 7.7  ALBUMIN 3.4*   No results for input(s): LIPASE, AMYLASE in the last 168 hours. No results for input(s): AMMONIA in the last 168 hours. Coagulation Profile: No results for input(s): INR, PROTIME in the last 168 hours. Cardiac Enzymes: No results for input(s): CKTOTAL, CKMB, CKMBINDEX, TROPONINI in the last 168 hours. BNP (last 3 results) No results for input(s): PROBNP in the last 8760 hours. HbA1C: No results for input(s): HGBA1C in the last 72 hours. CBG: No results for input(s): GLUCAP in the last 168 hours. Lipid Profile: No results for input(s): CHOL, HDL, LDLCALC, TRIG, CHOLHDL, LDLDIRECT in the last 72 hours. Thyroid Function Tests: No results for input(s): TSH, T4TOTAL, FREET4, T3FREE, THYROIDAB in the last 72 hours. Anemia Panel: No results for input(s): VITAMINB12, FOLATE, FERRITIN, TIBC, IRON, RETICCTPCT in the last 72 hours. Urine analysis:    Component Value Date/Time   COLORURINE YELLOW 07/14/2018 1629   APPEARANCEUR Cloudy (A) 07/14/2018 1629   LABSPEC 1.020 07/14/2018 1629   PHURINE 5.5 07/14/2018 1629   GLUCOSEU NEGATIVE 07/14/2018 1629   HGBUR MODERATE (A) 07/14/2018 1629   BILIRUBINUR NEGATIVE 07/14/2018 1629   KETONESUR NEGATIVE 07/14/2018 1629   PROTEINUR 100 (A) 04/03/2017 2108    UROBILINOGEN 0.2 07/14/2018 1629   NITRITE POSITIVE (A) 07/14/2018 1629   LEUKOCYTESUR SMALL (A) 07/14/2018 1629    Radiological Exams on Admission: DG Chest 2 View  Result Date: 11/21/2019 CLINICAL DATA:  Shortness of breath EXAM: CHEST - 2 VIEW COMPARISON:  04/03/2017 FINDINGS: Stable cardiomegaly. Aortic calcified and tortuous. Diffuse interstitial prominence. Patchy bibasilar opacities. Trace bilateral pleural effusions. No pneumothorax is seen, although lung apices are obscured. Chronic fusion of the T9 and T10 vertebral bodies. IMPRESSION: Findings suggestive of CHF with pulmonary edema and trace bilateral pleural effusions. Electronically Signed   By: Davina Poke D.O.   On: 11/21/2019 11:21    EKG: Independently reviewed. NSR with PACs  Assessment/Plan  Hypertensive urgency -No overt neurologic deficits.  Presentation consistent with hypertensive urgency due to noncompliance for medications due to financial constraints.  He will need medication assistance at time of discharge, social work assessment in a.m., and follow-up appointment back with PCP whom he has not seen in over 1 year. -Restarted on home Coreg, lisinopril, Imdur; will need up titration as tolerated -Blood pressure was refractory to nitroglycerin paste in the ER and was transitioned to nitroglycerin infusion, continue to wean -Chest pain-free and EKG negative for ischemic changes; trend troponin.  Likely elevated due to demand and hypertensive urgency -Repeat echo given length of time from last and to reassess EF  Hypertension -Home regimen resumed, see above --Will need medication assistance at discharge  Hyperlipidemia -Check lipid panel -Continue statin  CAD -Resume on daily aspirin -Continue statin, lisinopril, Coreg  CKDIII -Baseline creatinine approximately 1.2.  Currently at baseline -Avoid nephrotoxic agents -BMP in a.m.  Atrial fibrillation -Currently in normal sinus rhythm on evaluation in  the ER.  Chart review reveals history of A. fib however patient denies being on anticoagulation in the past.  However, he is uncertain.  Although reviewing chart, office visit from 07/14/2018, he was noted to be unable to afford Eliquis nor much of his other medications at that time.  -Continue monitoring with telemetry -he is high risk CHA2DS2-VASc = 8 points and would initiate AC if affordable  - continue with lovenox for DVT ppx for now  Acute on chronic combined systolic and diastolic CHF -EF 40 to 18% with grade 2 diastolic dysfunction on 2993 echo -  BNP elevated, 319 -Repeat echo  Carotid artery stenosis -Status post right CEA 2017 -Continue aspirin   DVT prophylaxis: enoxaparin (Lovenox) 108m SQ 2 hours prior to surgery then every day Code Status: Full code Family Communication: none present Disposition Plan: home Consults called: none Admission status: Inpatient, stepdown   DDwyane DeeMD [facility] Pager [phone] If 7PM-7AM, please contact night-coverage [email or web address] Use universal [place] password for that web site. If you do not have the password, please call the hospital operator.  11/21/2019, 10:29 PM

## 2019-11-22 ENCOUNTER — Inpatient Hospital Stay (HOSPITAL_COMMUNITY): Payer: Medicare Other

## 2019-11-22 DIAGNOSIS — E785 Hyperlipidemia, unspecified: Secondary | ICD-10-CM

## 2019-11-22 DIAGNOSIS — I351 Nonrheumatic aortic (valve) insufficiency: Secondary | ICD-10-CM

## 2019-11-22 DIAGNOSIS — I5043 Acute on chronic combined systolic (congestive) and diastolic (congestive) heart failure: Secondary | ICD-10-CM

## 2019-11-22 DIAGNOSIS — I350 Nonrheumatic aortic (valve) stenosis: Secondary | ICD-10-CM

## 2019-11-22 DIAGNOSIS — I480 Paroxysmal atrial fibrillation: Secondary | ICD-10-CM

## 2019-11-22 LAB — BASIC METABOLIC PANEL
Anion gap: 11 (ref 5–15)
BUN: 20 mg/dL (ref 8–23)
CO2: 22 mmol/L (ref 22–32)
Calcium: 8.8 mg/dL — ABNORMAL LOW (ref 8.9–10.3)
Chloride: 106 mmol/L (ref 98–111)
Creatinine, Ser: 1.24 mg/dL (ref 0.61–1.24)
GFR calc Af Amer: >60 mL/min (ref >60)
GFR calc non Af Amer: 56 mL/min — ABNORMAL LOW (ref >60)
Glucose, Bld: 95 mg/dL (ref 70–99)
Potassium: 3.5 mmol/L (ref 3.5–5.1)
Sodium: 139 mmol/L (ref 135–145)

## 2019-11-22 LAB — CBC
HCT: 38.7 % — ABNORMAL LOW (ref 39.0–52.0)
Hemoglobin: 11.6 g/dL — ABNORMAL LOW (ref 13.0–17.0)
MCH: 21.5 pg — ABNORMAL LOW (ref 26.0–34.0)
MCHC: 30 g/dL (ref 30.0–36.0)
MCV: 71.8 fL — ABNORMAL LOW (ref 80.0–100.0)
Platelets: 151 10*3/uL (ref 150–400)
RBC: 5.39 MIL/uL (ref 4.22–5.81)
RDW: 16.8 % — ABNORMAL HIGH (ref 11.5–15.5)
WBC: 4.6 10*3/uL (ref 4.0–10.5)
nRBC: 0 % (ref 0.0–0.2)

## 2019-11-22 LAB — HEMOGLOBIN A1C
Hgb A1c MFr Bld: 6.2 % — ABNORMAL HIGH (ref 4.8–5.6)
Mean Plasma Glucose: 131.24 mg/dL

## 2019-11-22 LAB — LIPID PANEL
Cholesterol: 178 mg/dL (ref 0–200)
HDL: 37 mg/dL — ABNORMAL LOW (ref >40)
LDL Cholesterol: 129 mg/dL — ABNORMAL HIGH (ref 0–99)
Total CHOL/HDL Ratio: 4.8 RATIO
Triglycerides: 58 mg/dL (ref <150)
VLDL: 12 mg/dL (ref 0–40)

## 2019-11-22 LAB — TROPONIN I (HIGH SENSITIVITY)
Troponin I (High Sensitivity): 35 ng/L — ABNORMAL HIGH (ref <18)
Troponin I (High Sensitivity): 27 ng/L — ABNORMAL HIGH (ref <18)

## 2019-11-22 LAB — MAGNESIUM: Magnesium: 1.8 mg/dL (ref 1.7–2.4)

## 2019-11-22 LAB — ECHOCARDIOGRAM COMPLETE

## 2019-11-22 MED ORDER — OXYBUTYNIN CHLORIDE ER 5 MG PO TB24
5.0000 mg | ORAL_TABLET | Freq: Every day | ORAL | Status: DC
  Administered 2019-11-23 – 2019-11-27 (×6): 5 mg via ORAL
  Filled 2019-11-22 (×7): qty 1

## 2019-11-22 MED ORDER — POTASSIUM CHLORIDE CRYS ER 10 MEQ PO TBCR
10.0000 meq | EXTENDED_RELEASE_TABLET | Freq: Every day | ORAL | Status: DC
  Administered 2019-11-22 – 2019-11-24 (×3): 10 meq via ORAL
  Filled 2019-11-22 (×5): qty 1

## 2019-11-22 MED ORDER — FUROSEMIDE 10 MG/ML IJ SOLN
40.0000 mg | Freq: Two times a day (BID) | INTRAMUSCULAR | Status: AC
  Administered 2019-11-22 – 2019-11-23 (×3): 40 mg via INTRAVENOUS
  Filled 2019-11-22 (×3): qty 4

## 2019-11-22 MED ORDER — DIPHENHYDRAMINE HCL 50 MG/ML IJ SOLN: 25.0000 mg | Freq: Four times a day (QID) | INTRAMUSCULAR | Status: DC | PRN

## 2019-11-22 MED ORDER — HYDRALAZINE HCL 10 MG PO TABS
10.0000 mg | ORAL_TABLET | Freq: Four times a day (QID) | ORAL | Status: DC | PRN
  Administered 2019-11-22: 19:00:00 10 mg via ORAL
  Filled 2019-11-22: qty 1

## 2019-11-22 MED ORDER — ONDANSETRON HCL 4 MG/2ML IJ SOLN
4.0000 mg | Freq: Four times a day (QID) | INTRAMUSCULAR | Status: DC | PRN
  Administered 2019-11-24: 10:00:00 4 mg via INTRAVENOUS
  Filled 2019-11-22: qty 2

## 2019-11-22 MED ORDER — TAMSULOSIN HCL 0.4 MG PO CAPS
0.4000 mg | ORAL_CAPSULE | Freq: Every day | ORAL | Status: DC
  Administered 2019-11-22 – 2019-11-27 (×6): 0.4 mg via ORAL
  Filled 2019-11-22 (×6): qty 1

## 2019-11-22 MED ORDER — ACETAMINOPHEN 325 MG PO TABS
650.0000 mg | ORAL_TABLET | ORAL | Status: DC | PRN
  Administered 2019-11-22: 11:00:00 650 mg via ORAL
  Filled 2019-11-22 (×2): qty 2

## 2019-11-22 MED ORDER — METOCLOPRAMIDE HCL 5 MG/ML IJ SOLN: 10.0000 mg | Freq: Four times a day (QID) | INTRAMUSCULAR | Status: DC | PRN

## 2019-11-22 MED ORDER — FUROSEMIDE 20 MG PO TABS: 20.0000 mg | ORAL_TABLET | Freq: Every day | ORAL | Status: DC

## 2019-11-22 NOTE — Progress Notes (Signed)
  Echocardiogram 2D Echocardiogram has been performed.  Merrily Tegeler A Ansel Ferrall 11/22/2019, 3:36 PM

## 2019-11-22 NOTE — ED Notes (Signed)
NTG infusion stopped as per order at this time. Pt. SBP < 160

## 2019-11-22 NOTE — Progress Notes (Signed)
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  In the ED his blood pressure was found to be as high as 239/143.  Blood pressure was refractory to oral and topical treatments and therefore nitroglycerin drip was initiated in the ED.  Fortunately the patient had no focal deficits on physical exam.  Significant Events: 4/20 admit via [facility] ED hypertensive emergency  Antimicrobials:  None   Subjective: The patient was seen while still in the ED.  He is pleasant and interactive but seems confused.  He is easily distracted and his conversation is very tangential.  It is difficult to obtain a full history from him.  He does however deny chest pain or shortness of breath at the present time.  He does not appear to be uncomfortable.  Assessment & Plan:  Uncontrolled hypertension - hypertensive urgency BP now well controlled -titrate oral medications and wean nitroglycerin drip  Acute on chronic combined systolic and diastolic CHF EF [id number] with grade 2 DD via TTE 2018 -no gross volume overload on physical exam -continue maintenance diuretic  HLD  CAD No acute symptoms presently  Prediabetes A1c 6.2 this admission, though the patient actually did meet criteria for a formal diagnosis of diabetes in 2019  History of CVA  CKD stage III Baseline creatinine approximately 1.2 -creatinine stable at this time  Microcytic anemia Check anemia panel and guaiac stools  Possible history of atrial fibrillation Currently NSR -chart suggest prior history of atrial fibrillation -patient denies any history of  using anticoagulation -CHA2DS2-VASc is 8 -will not consider full anticoagulation at this time unless telemetry confirms atrial fibrillation and microcytic anemia is further explained  DVT prophylaxis: Lovenox Code Status: FULL CODE Family Communication:  Disposition Plan:   Consultants:  none  Objective: Blood pressure 135/71, pulse 66, temperature 97.9 F (36.6 C), resp. rate (!) 27, SpO2 99 %. No intake or output data in the 24 hours ending 11/22/19 0719 There were no vitals filed for this visit.  Examination: General: No acute respiratory distress Lungs: Clear to auscultation bilaterally without wheezes or crackles Cardiovascular: Regular rate and rhythm without murmur gallop or rub normal S1 and S2 Abdomen: Nontender, nondistended, soft, bowel sounds positive, no rebound, no ascites, no appreciable mass Extremities: No significant cyanosis, clubbing, or edema bilateral lower extremities  CBC: Recent Labs  Lab 11/21/19 1102 11/22/19 0209  WBC 4.5 4.6  HGB 11.2* 11.6*  HCT 38.0* 38.7*  MCV 72.4* 71.8*  PLT 145* [id number]   Basic Metabolic Panel: Recent Labs  Lab 11/21/19 1844 11/22/19 0209  NA 140 139  K 4.3 3.5  CL 104 106  CO2 26 22  GLUCOSE 92 95  BUN 21 20  CREATININE 1.25* 1.24  CALCIUM 9.2 8.8*  MG  --  1.8   GFR: CrCl cannot be calculated (Unknown ideal weight.).  Liver Function Tests: Recent Labs  Lab 11/21/19 1844  AST 24  ALT 17  ALKPHOS 68  BILITOT 0.7  PROT 7.7  ALBUMIN 3.4*   No results for input(s): LIPASE, AMYLASE in the last 168 hours. No results for input(s): AMMONIA in the last  168 hours.  Coagulation Profile: No results for input(s): INR, PROTIME in the last 168 hours.  Cardiac Enzymes: No results for input(s): CKTOTAL, CKMB, CKMBINDEX, TROPONINI in the last 168 hours.  HbA1C: Hgb A1c MFr Bld  Date/Time Value Ref Range Status  11/22/2019 02:15 AM 6.2 (H) 4.8 - 5.6 % Final    Comment:    (NOTE) Pre diabetes:           5.7%-6.4% Diabetes:              >6.4% Glycemic control for   <7.0% adults with diabetes   01/27/2018 03:08 PM 6.5 4.6 - 6.5 % Final    Comment:    Glycemic Control Guidelines for People with Diabetes:Non Diabetic:  <6%Goal of Therapy: <7%Additional Action Suggested:  >8%     CBG: No results for input(s): GLUCAP in the last 168 hours.  Recent Results (from the past 240 hour(s))  Respiratory Panel by RT PCR (Flu A&B, Covid) - Nasopharyngeal Swab     Status: None   Collection Time: 11/21/19  9:03 PM   Specimen: Nasopharyngeal Swab  Result Value Ref Range Status   SARS Coronavirus 2 by RT PCR NEGATIVE NEGATIVE Final    Comment: (NOTE) SARS-CoV-2 target nucleic acids are NOT DETECTED. The SARS-CoV-2 RNA is generally detectable in upper respiratoy specimens during the acute phase of infection. The lowest concentration of SARS-CoV-2 viral copies this assay can detect is 131 copies/mL. A negative result does not preclude SARS-Cov-2 infection and should not be used as the sole basis for treatment or other patient management decisions. A negative result may occur with  improper specimen collection/handling, submission of specimen other than nasopharyngeal swab, presence of viral mutation(s) within the areas targeted by this assay, and inadequate number of viral copies (<131 copies/mL). A negative result must be combined with clinical observations, patient history, and epidemiological information. The expected result is Negative. Fact Sheet for Patients:  PinkCheek.be Fact Sheet for Healthcare Providers:  GravelBags.it This test is not yet ap proved or cleared by the [place] FDA and  has been authorized for detection and/or diagnosis of SARS-CoV-2 by FDA under an Emergency Use Authorization (EUA). This EUA will remain  in effect (meaning this test can be used) for the duration of the COVID-19 declaration under Section  564(b)(1) of the Act, 21 U.S.C. section 360bbb-3(b)(1), unless the authorization is terminated or revoked sooner.    Influenza A by PCR NEGATIVE NEGATIVE Final   Influenza B by PCR NEGATIVE NEGATIVE Final    Comment: (NOTE) The Xpert Xpress SARS-CoV-2/FLU/RSV assay is intended as an aid in  the diagnosis of influenza from Nasopharyngeal swab specimens and  should not be used as a sole basis for treatment. Nasal washings and  aspirates are unacceptable for Xpert Xpress SARS-CoV-2/FLU/RSV  testing. Fact Sheet for Patients: PinkCheek.be Fact Sheet for Healthcare Providers: GravelBags.it This test is not yet approved or cleared by the [place] FDA and  has been authorized for detection and/or diagnosis of SARS-CoV-2 by  FDA under an Emergency Use Authorization (EUA). This EUA will remain  in effect (meaning this test can be used) for the duration of the  Covid-19 declaration under Section 564(b)(1) of the Act, 21  U.S.C. section 360bbb-3(b)(1), unless the authorization is  terminated or revoked. Performed at [facility], [place] [address] [place], [place] [address]      Scheduled Meds: . aspirin EC  81 mg Oral Daily  . atorvastatin  80 mg Oral  Daily  . carvedilol  25 mg Oral BID  . enoxaparin (LOVENOX) injection  40 mg Subcutaneous Daily  . isosorbide mononitrate  60 mg Oral Daily  . lisinopril  5 mg Oral Daily  . sodium chloride flush  3 mL Intravenous Q12H   Continuous Infusions: . nitroGLYCERIN 70 mcg/min (11/22/19 0354)     LOS: 1 day   Cherene Altes, MD [facility] Office  [phone] Pager - Text Page per Amion  If 7PM-7AM, please contact night-coverage per Amion 11/22/2019, 7:19 AM

## 2019-11-22 NOTE — ED Notes (Signed)
SDU  bfast ordered  

## 2019-11-22 NOTE — ED Notes (Signed)
Pt. SpO2 90% while sleeping. Pt. Placed on O2 via [place] at 3LPM as per orders

## 2019-11-22 NOTE — ED Notes (Addendum)
Pt is resting comfortably.

## 2019-11-23 ENCOUNTER — Encounter (HOSPITAL_COMMUNITY): Payer: Self-pay | Admitting: Internal Medicine

## 2019-11-23 DIAGNOSIS — I110 Hypertensive heart disease with heart failure: Secondary | ICD-10-CM | POA: Diagnosis present

## 2019-11-23 LAB — IRON AND TIBC
Iron: 28 ug/dL — ABNORMAL LOW (ref 45–182)
Saturation Ratios: 8 % — ABNORMAL LOW (ref 17.9–39.5)
TIBC: 337 ug/dL (ref 250–450)
UIBC: 309 ug/dL

## 2019-11-23 LAB — CBC
HCT: 35.1 % — ABNORMAL LOW (ref 39.0–52.0)
Hemoglobin: 10.6 g/dL — ABNORMAL LOW (ref 13.0–17.0)
MCH: 21.4 pg — ABNORMAL LOW (ref 26.0–34.0)
MCHC: 30.2 g/dL (ref 30.0–36.0)
MCV: 70.8 fL — ABNORMAL LOW (ref 80.0–100.0)
Platelets: 145 10*3/uL — ABNORMAL LOW (ref 150–400)
RBC: 4.96 MIL/uL (ref 4.22–5.81)
RDW: 16.5 % — ABNORMAL HIGH (ref 11.5–15.5)
WBC: 5.8 10*3/uL (ref 4.0–10.5)
nRBC: 0 % (ref 0.0–0.2)

## 2019-11-23 LAB — COMPREHENSIVE METABOLIC PANEL
ALT: 13 U/L (ref 0–44)
AST: 16 U/L (ref 15–41)
Albumin: 2.9 g/dL — ABNORMAL LOW (ref 3.5–5.0)
Alkaline Phosphatase: 55 U/L (ref 38–126)
Anion gap: 10 (ref 5–15)
BUN: 24 mg/dL — ABNORMAL HIGH (ref 8–23)
CO2: 24 mmol/L (ref 22–32)
Calcium: 8.4 mg/dL — ABNORMAL LOW (ref 8.9–10.3)
Chloride: 103 mmol/L (ref 98–111)
Creatinine, Ser: 1.41 mg/dL — ABNORMAL HIGH (ref 0.61–1.24)
GFR calc Af Amer: 55 mL/min — ABNORMAL LOW (ref >60)
GFR calc non Af Amer: 48 mL/min — ABNORMAL LOW (ref >60)
Glucose, Bld: 93 mg/dL (ref 70–99)
Potassium: 3.8 mmol/L (ref 3.5–5.1)
Sodium: 137 mmol/L (ref 135–145)
Total Bilirubin: 0.3 mg/dL (ref 0.3–1.2)
Total Protein: 6.6 g/dL (ref 6.5–8.1)

## 2019-11-23 LAB — VITAMIN B12: Vitamin B-12: 346 pg/mL (ref 180–914)

## 2019-11-23 LAB — RETICULOCYTES
Immature Retic Fract: 10.2 % (ref 2.3–15.9)
RBC.: 4.98 MIL/uL (ref 4.22–5.81)
Retic Count, Absolute: 54.3 10*3/uL (ref 19.0–186.0)
Retic Ct Pct: 1.1 % (ref 0.4–3.1)

## 2019-11-23 LAB — MRSA PCR SCREENING: MRSA by PCR: NEGATIVE

## 2019-11-23 LAB — FOLATE: Folate: 6.6 ng/mL (ref >5.9)

## 2019-11-23 LAB — FERRITIN: Ferritin: 70 ng/mL (ref 24–336)

## 2019-11-23 LAB — TSH: TSH: 0.875 u[IU]/mL (ref 0.350–4.500)

## 2019-11-23 MED ORDER — HYDRALAZINE HCL 50 MG PO TABS
50.0000 mg | ORAL_TABLET | Freq: Three times a day (TID) | ORAL | Status: DC
  Administered 2019-11-23 – 2019-11-24 (×4): 50 mg via ORAL
  Filled 2019-11-23: qty 2
  Filled 2019-11-23 (×3): qty 1

## 2019-11-23 MED ORDER — MAGNESIUM SULFATE 2 GM/50ML IV SOLN
2.0000 g | Freq: Once | INTRAVENOUS | Status: AC
  Administered 2019-11-23: 23:00:00 2 g via INTRAVENOUS
  Filled 2019-11-23: qty 50

## 2019-11-23 MED ORDER — ISOSORBIDE DINITRATE 10 MG PO TABS
20.0000 mg | ORAL_TABLET | Freq: Three times a day (TID) | ORAL | Status: DC
  Administered 2019-11-23 – 2019-11-24 (×4): 20 mg via ORAL
  Filled 2019-11-23 (×2): qty 2
  Filled 2019-11-23: qty 1
  Filled 2019-11-23: qty 2

## 2019-11-23 NOTE — Progress Notes (Signed)
Pt had a run of SVT, no s/s, pt has hx of Afib and has Bmet ordered in am, MD notified, will cont to monitor, Thanks Arvella Nigh RN.

## 2019-11-23 NOTE — ED Notes (Signed)
SDU  bfast ordered  

## 2019-11-23 NOTE — Evaluation (Signed)
Physical Therapy Evaluation Patient Details Name: [name] MRN: [id number] DOB: 11/05/1940 Today's Date: 11/23/2019   History of Present Illness  Mr. Galdi is a 79 yo AA male with PMH CVA (residual LLE weakness and dysarthria), HTN (medication noncompliance 2/2 financial constraints), CAD, CAS (s/p right CEA 2017), HLD, combined systolic diastolic CHF (EF [id number], GrII DD on 2018 echo) who presents to the ER with nausea, mild anxiety/irritation, "breathing hard", and a cough that is nonproductive.  Admitted with hypertensive urgency and bilat small pleural effusions per CXR.  Clinical Impression  Patient presents with decreased independence and safety with mobility demonstrating anterior bias and high risk for falls (reports feeling like he might stumble).  Feel he may need STSNF unless someone can stay with him at home.  No c/o or noted SOB with ambulation, but did feel sensation on R parietal area (like something walking on my head).  Patient for transport to floor directly after ambulation so did not do follow up BP measurement.  PT will follow.     Follow Up Recommendations Supervision/Assistance - 24 hour;SNF    Equipment Recommendations  None recommended by PT    Recommendations for Other Services       Precautions / Restrictions Precautions Precautions: Fall Precaution Comments: one fall week or two prior to admission      Mobility  Bed Mobility Overal bed mobility: Needs Assistance Bed Mobility: Supine to Sit     Supine to sit: Min assist     General bed mobility comments: assist to initiate moving to EOB  Transfers Overall transfer level: Needs assistance Equipment used: 1 person hand held assist;None Transfers: Sit to/from Stand Sit to Stand: Supervision         General transfer comment: off edge of high stretcher without AD, stood with S for lines, but then noted leaning back on edge of stretcher, HHA given for ambulation and back to  bed/stretcher  Ambulation/Gait Ambulation/Gait assistance: Min assist;Mod assist Gait Distance (Feet): 120 Feet Assistive device: 1 person hand held assist Gait Pattern/deviations: Decreased stride length;Trunk flexed;Wide base of support;Step-to pattern;Step-through pattern     General Gait Details: assist and cues for posture due to anterior bias and at times up to mod A for balance, but mostly min A with cue for forward gaze and upright posture  Stairs            Wheelchair Mobility    Modified Rankin (Stroke Patients Only)       Balance Overall balance assessment: Needs assistance Sitting-balance support: Feet supported Sitting balance-Leahy Scale: Fair     Standing balance support: Single extremity supported Standing balance-Leahy Scale: Poor Standing balance comment: Static standing at bedside with legs braced against bed, then with ambulation needed HHA and min to mod support cues for posture due to anterior bias                             Pertinent Vitals/Pain Pain Assessment: No/denies pain    Home Living Family/patient expects to be discharged to:: Private residence Living Arrangements: Alone Available Help at Discharge: Friend(s);Available 24 hours/day Type of Home: House Home Access: Level entry     Home Layout: One level Home Equipment: Cane - single point;Shower seat;Walker - 4 wheels      Prior Function Level of Independence: Independent with assistive device(s)         Comments: uses cane mostly in community, one fall two weeks ago,  got up unaided     Hand Dominance        Extremity/Trunk Assessment   Upper Extremity Assessment Upper Extremity Assessment: Overall WFL for tasks assessed    Lower Extremity Assessment Lower Extremity Assessment: RLE deficits/detail RLE Deficits / Details: strength/ROM WFL RLE Coordination: decreased gross motor(decreased coordination moving in the bed)    Cervical / Trunk  Assessment Cervical / Trunk Assessment: Kyphotic  Communication   Communication: Expressive difficulties(at baseline)  Cognition Arousal/Alertness: Awake/alert Behavior During Therapy: WFL for tasks assessed/performed Overall Cognitive Status: Within Functional Limits for tasks assessed                                        General Comments General comments (skin integrity, edema, etc.): Patient reports feeling like something walking over his head on L parietal area at times (may be correlated with when BP is high) and some fullness in lower abdomen    Exercises     Assessment/Plan    PT Assessment Patient needs continued PT services  PT Problem List Decreased balance;Decreased coordination;Decreased mobility;Decreased safety awareness       PT Treatment Interventions DME instruction;Therapeutic activities;Balance training;Therapeutic exercise;Functional mobility training;Gait training;Patient/family education    PT Goals (Current goals can be found in the Care Plan section)  Acute Rehab PT Goals Patient Stated Goal: to feel better PT Goal Formulation: With patient Time For Goal Achievement: 12-25-2019 Potential to Achieve Goals: Good    Frequency Min 3X/week   Barriers to discharge Decreased caregiver support      Co-evaluation               AM-PAC PT "6 Clicks" Mobility  Outcome Measure Help needed turning from your back to your side while in a flat bed without using bedrails?: None Help needed moving from lying on your back to sitting on the side of a flat bed without using bedrails?: A Little Help needed moving to and from a bed to a chair (including a wheelchair)?: A Little Help needed standing up from a chair using your arms (e.g., wheelchair or bedside chair)?: A Little Help needed to walk in hospital room?: A Lot Help needed climbing 3-5 steps with a railing? : A Lot 6 Click Score: 17    End of Session   Activity Tolerance: Patient  tolerated treatment well Patient left: in bed(with RN taking to the floor)   PT Visit Diagnosis: Other abnormalities of gait and mobility (R26.89);History of falling (Z91.81)    Time: [id number] PT Time Calculation (min) (ACUTE ONLY): 23 min   Charges:   PT Evaluation $PT Eval Moderate Complexity: 1 Mod PT Treatments $Gait Training: 8-22 mins        Magda Kiel, [place] [facility] [phone] 11/23/2019   Reginia Naas 11/23/2019, 1:03 PM

## 2019-11-23 NOTE — Progress Notes (Signed)
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  In the ED his blood pressure was found to be as high as 239/143.  Blood pressure was refractory to oral and topical treatments and therefore nitroglycerin drip was initiated in the ED.  Fortunately the patient had no focal deficits on physical exam.  Significant Events: 4/20 admit via [name] hypertensive emergency 4/21 nitroglycerin drip discontinued 4/21 TTE -EF 35-40% with grade 2 DD -mild right systolic failure -biatrial severe dilation -moderate aortic stenosis with a mean gradient of 8 mmHg and a valve area of 1.49 cm -mild aortic valve regurgitation  Antimicrobials:  None   Subjective: Mental status more appropriate today.  Patient resting comfortably in bed.  Denies chest pain shortness of breath fevers or chills.  I had the pleasure of speaking at length with the patient's sister at the bedside who is his primary caretaker.  We all discussed the likelihood that the patient would not be suitable for discharge directly home that he would likely require at minimum a rehab stay if not permanent SNF placement.  The patient agreed to consider this pending a PT/OT evaluation.  Assessment & Plan:  Uncontrolled hypertension - hypertensive urgency Blood pressure control much improved - now off nitro drip -titrate oral medications further and follow  Acute on chronic combined systolic and diastolic CHF EF [id number] with grade 2 DD via TTE 2018 -with intermittent hypoxia we will continue to diurese before settling on maintenance dosing  HLD Continue previously  prescribed Lipitor  CAD No acute symptoms - D2 30% and LAD 20-30% per cardiac cath 2008  Prediabetes A1c 6.2 this admission, though the patient actually did meet criteria for a formal diagnosis of diabetes in 2019 -counseled on diet during this hospital stay  History of thromboembolic CVA Baseline deficits include left lower extremity weakness and dysarthria  CKD stage III Baseline creatinine approximately 1.2 -creatinine creeping up this morning in presence of increased diuresis -monitor trend  Microcytic anemia Iron is significantly low at 28 with a ferritin of 70 -[id number] and folic acid are not abnormal -patient will need outpatient screening colonoscopy if this has not been previously accomplished -awaiting stool guaiac -records indicate a history of iron deficiency anemia dating back to January 2013  Paroxysmal atrial fibrillation Currently NSR -chart suggest prior history of atrial fibrillation -patient denies any history of using anticoagulation but list of old medications include Eliquis-CHA2DS2-VASc is 8 -will not consider full anticoagulation at this time unless telemetry confirms atrial fibrillation and microcytic anemia is further explained  Apparent cognitive deficit  DVT prophylaxis: Lovenox Code Status: FULL CODE Family Communication: Spoke at length with sister at bedside Disposition Plan: Patient was previously living independently at home but was frequently noncompliant with his medications and is presently displaying significant cognitive deficit making me suspect that he is no longer safe to live independently -m PT/OT are being consulted with suspicion that the patient will the very least require a rehab stay in an SNF but quite possibly permanent residence  Consultants:  none  Objective: Blood pressure (!) 170/95, pulse 66, temperature 98.4 F (36.9 C),  temperature source Oral, resp. rate 19, SpO2 100 %.  Intake/Output Summary (Last 24 hours) at 11/23/2019  0717 Last data filed at 11/22/2019 1835 Gross per 24 hour  Intake 275.2 ml  Output 1400 ml  Net -1124.8 ml   There were no vitals filed for this visit.  Examination: General: No acute respiratory distress Lungs: CTA B without wheezing Cardiovascular: RRR without murmur or rub Abdomen: NT/ND, soft, BS positive, no rebound Extremities: No significant cyanosis, clubbing, or edema bilateral lower extremities  CBC: Recent Labs  Lab 11/21/19 1102 11/22/19 0209 11/23/19 0428  WBC 4.5 4.6 5.8  HGB 11.2* 11.6* 10.6*  HCT 38.0* 38.7* 35.1*  MCV 72.4* 71.8* 70.8*  PLT 145* 151 [id number]*   Basic Metabolic Panel: Recent Labs  Lab 11/21/19 1844 11/22/19 0209 11/23/19 0428  NA 140 139 137  K 4.3 3.5 3.8  CL 104 106 103  CO2 26 22 24   GLUCOSE 92 95 93  BUN 21 20 24*  CREATININE 1.25* 1.24 1.41*  CALCIUM 9.2 8.8* 8.4*  MG  --  1.8  --    GFR: CrCl cannot be calculated (Unknown ideal weight.).  Liver Function Tests: Recent Labs  Lab 11/21/19 1844 11/23/19 0428  AST 24 16  ALT 17 13  ALKPHOS 68 55  BILITOT 0.7 0.3  PROT 7.7 6.6  ALBUMIN 3.4* 2.9*   HbA1C: Hgb A1c MFr Bld  Date/Time Value Ref Range Status  11/22/2019 02:15 AM 6.2 (H) 4.8 - 5.6 % Final    Comment:    (NOTE) Pre diabetes:          5.7%-6.4% Diabetes:              >6.4% Glycemic control for   <7.0% adults with diabetes   01/27/2018 03:08 PM 6.5 4.6 - 6.5 % Final    Comment:    Glycemic Control Guidelines for People with Diabetes:Non Diabetic:  <6%Goal of Therapy: <7%Additional Action Suggested:  >8%      Recent Results (from the past 240 hour(s))  Respiratory Panel by RT PCR (Flu A&B, Covid) - Nasopharyngeal Swab     Status: None   Collection Time: 11/21/19  9:03 PM   Specimen: Nasopharyngeal Swab  Result Value Ref Range Status   SARS Coronavirus 2 by RT PCR NEGATIVE NEGATIVE Final    Comment: (NOTE) SARS-CoV-2 target nucleic acids are NOT DETECTED. The SARS-CoV-2 RNA is generally detectable in  upper respiratoy specimens during the acute phase of infection. The lowest concentration of SARS-CoV-2 viral copies this assay can detect is 131 copies/mL. A negative result does not preclude SARS-Cov-2 infection and should not be used as the sole basis for treatment or other patient management decisions. A negative result may occur with  improper specimen collection/handling, submission of specimen other than nasopharyngeal swab, presence of viral mutation(s) within the areas targeted by this assay, and inadequate number of viral copies (<131 copies/mL). A negative result must be combined with clinical observations, patient history, and epidemiological information. The expected result is Negative. Fact Sheet for Patients:  PinkCheek.be Fact Sheet for Healthcare Providers:  GravelBags.it This test is not yet ap proved or cleared by the [place] FDA and  has been authorized for detection and/or diagnosis of SARS-CoV-2 by FDA under an Emergency Use Authorization (EUA). This EUA will remain  in effect (meaning this test can be used) for the duration of the COVID-19 declaration under Section 564(b)(1) of the Act, 21 U.S.C. section 360bbb-3(b)(1), unless the authorization is terminated or  revoked sooner.    Influenza A by PCR NEGATIVE NEGATIVE Final   Influenza B by PCR NEGATIVE NEGATIVE Final    Comment: (NOTE) The Xpert Xpress SARS-CoV-2/FLU/RSV assay is intended as an aid in  the diagnosis of influenza from Nasopharyngeal swab specimens and  should not be used as a sole basis for treatment. Nasal washings and  aspirates are unacceptable for Xpert Xpress SARS-CoV-2/FLU/RSV  testing. Fact Sheet for Patients: PinkCheek.be Fact Sheet for Healthcare Providers: GravelBags.it This test is not yet approved or cleared by the [place] FDA and  has been authorized for  detection and/or diagnosis of SARS-CoV-2 by  FDA under an Emergency Use Authorization (EUA). This EUA will remain  in effect (meaning this test can be used) for the duration of the  Covid-19 declaration under Section 564(b)(1) of the Act, 21  U.S.C. section 360bbb-3(b)(1), unless the authorization is  terminated or revoked. Performed at [facility], [place] [address], [place], [place] [address]      Scheduled Meds: . aspirin EC  81 mg Oral Daily  . atorvastatin  80 mg Oral Daily  . carvedilol  25 mg Oral BID  . furosemide  40 mg Intravenous Q12H  . hydrALAZINE  50 mg Oral Q8H  . isosorbide dinitrate  20 mg Oral TID  . lisinopril  5 mg Oral Daily  . oxybutynin  5 mg Oral QHS  . potassium chloride  10 mEq Oral Daily  . sodium chloride flush  3 mL Intravenous Q12H  . tamsulosin  0.4 mg Oral QPC supper      LOS: 2 days   Cherene Altes, MD [facility] Office  [phone] Pager - Text Page per Amion  If 7PM-7AM, please contact night-coverage per Amion 11/23/2019, 7:17 AM

## 2019-11-23 NOTE — Plan of Care (Signed)

## 2019-11-23 NOTE — Plan of Care (Signed)
  Problem: Education: Goal: Knowledge of General Education information will improve Description: Including pain rating scale, medication(s)/side effects and non-pharmacologic comfort measures 11/23/2019 2031 by Arlina Robes, RN Outcome: Progressing 11/23/2019 2031 by Arlina Robes, RN Outcome: Progressing   Problem: Education: Goal: Ability to demonstrate management of disease process will improve 11/23/2019 2031 by Arlina Robes, RN Outcome: Progressing 11/23/2019 2031 by Arlina Robes, RN Outcome: Progressing Goal: Ability to verbalize understanding of medication therapies will improve 11/23/2019 2031 by Arlina Robes, RN Outcome: Progressing 11/23/2019 2031 by Arlina Robes, RN Outcome: Progressing Goal: Individualized Educational Video(s) 11/23/2019 2031 by Arlina Robes, RN Outcome: Progressing 11/23/2019 2031 by Arlina Robes, RN Outcome: Progressing   Problem: Activity: Goal: Capacity to carry out activities will improve 11/23/2019 2031 by Arlina Robes, RN Outcome: Progressing 11/23/2019 2031 by Arlina Robes, RN Outcome: Progressing   Problem: Cardiac: Goal: Ability to achieve and maintain adequate cardiopulmonary perfusion will improve 11/23/2019 2031 by Arlina Robes, RN Outcome: Progressing 11/23/2019 2031 by Arlina Robes, RN Outcome: Progressing   Problem: Education: Goal: Knowledge of General Education information will improve Description: Including pain rating scale, medication(s)/side effects and non-pharmacologic comfort measures Outcome: Progressing   Problem: Health Behavior/Discharge Planning: Goal: Ability to manage health-related needs will improve Outcome: Progressing   Problem: Clinical Measurements: Goal: Ability to maintain clinical measurements within normal limits will improve Outcome: Progressing Goal: Will remain free from infection Outcome: Progressing Goal: Diagnostic test results  will improve Outcome: Progressing Goal: Respiratory complications will improve Outcome: Progressing Goal: Cardiovascular complication will be avoided Outcome: Progressing   Problem: Activity: Goal: Risk for activity intolerance will decrease Outcome: Progressing   Problem: Nutrition: Goal: Adequate nutrition will be maintained Outcome: Progressing   Problem: Coping: Goal: Level of anxiety will decrease Outcome: Progressing   Problem: Elimination: Goal: Will not experience complications related to bowel motility Outcome: Progressing Goal: Will not experience complications related to urinary retention Outcome: Progressing   Problem: Pain Managment: Goal: General experience of comfort will improve Outcome: Progressing   Problem: Safety: Goal: Ability to remain free from injury will improve Outcome: Progressing   Problem: Skin Integrity: Goal: Risk for impaired skin integrity will decrease Outcome: Progressing

## 2019-11-23 NOTE — ED Notes (Signed)
Ate 100% of breakfast, remains restful in bed.

## 2019-11-23 NOTE — ED Notes (Signed)
Pt attempting to get up OOB to urinate.  He has disconnected his wires, condom cath and is confused.  Able to redirect and ambulate short shuffled steps back to bed.   Condom cath reapplied and instructions repeated x 3 about call light.  SR up x 2.  Caregiver also called and will come to sit with him as well.

## 2019-11-24 LAB — BASIC METABOLIC PANEL
Anion gap: 7 (ref 5–15)
BUN: 25 mg/dL — ABNORMAL HIGH (ref 8–23)
CO2: 27 mmol/L (ref 22–32)
Calcium: 8.5 mg/dL — ABNORMAL LOW (ref 8.9–10.3)
Chloride: 102 mmol/L (ref 98–111)
Creatinine, Ser: 1.53 mg/dL — ABNORMAL HIGH (ref 0.61–1.24)
GFR calc Af Amer: 50 mL/min — ABNORMAL LOW (ref >60)
GFR calc non Af Amer: 43 mL/min — ABNORMAL LOW (ref >60)
Glucose, Bld: 109 mg/dL — ABNORMAL HIGH (ref 70–99)
Potassium: 4.3 mmol/L (ref 3.5–5.1)
Sodium: 136 mmol/L (ref 135–145)

## 2019-11-24 LAB — MAGNESIUM: Magnesium: 2.2 mg/dL (ref 1.7–2.4)

## 2019-11-24 MED ORDER — HYDRALAZINE HCL 25 MG PO TABS: 25.0000 mg | ORAL_TABLET | Freq: Three times a day (TID) | ORAL | Status: DC

## 2019-11-24 MED ORDER — HYDRALAZINE HCL 50 MG PO TABS: 50.0000 mg | ORAL_TABLET | Freq: Three times a day (TID) | ORAL | Status: DC

## 2019-11-24 MED ORDER — APIXABAN 5 MG PO TABS
5.0000 mg | ORAL_TABLET | Freq: Two times a day (BID) | ORAL | Status: DC
  Administered 2019-11-24 – 2019-11-28 (×9): 5 mg via ORAL
  Filled 2019-11-24 (×9): qty 1

## 2019-11-24 MED ORDER — CARVEDILOL 12.5 MG PO TABS
12.5000 mg | ORAL_TABLET | Freq: Two times a day (BID) | ORAL | Status: DC
  Administered 2019-11-24 – 2019-11-28 (×8): 12.5 mg via ORAL
  Filled 2019-11-24 (×8): qty 1

## 2019-11-24 MED ORDER — SODIUM CHLORIDE 0.9 % IV SOLN
510.0000 mg | INTRAVENOUS | Status: DC
  Administered 2019-11-24: 11:00:00 510 mg via INTRAVENOUS
  Filled 2019-11-24: qty 17

## 2019-11-24 MED ORDER — ISOSORBIDE DINITRATE 10 MG PO TABS
10.0000 mg | ORAL_TABLET | Freq: Three times a day (TID) | ORAL | Status: DC
  Administered 2019-11-25 – 2019-11-26 (×6): 10 mg via ORAL
  Filled 2019-11-24 (×6): qty 1

## 2019-11-24 MED ORDER — HYDRALAZINE HCL 25 MG PO TABS
25.0000 mg | ORAL_TABLET | Freq: Three times a day (TID) | ORAL | Status: DC
  Administered 2019-11-25 – 2019-11-26 (×4): 25 mg via ORAL
  Filled 2019-11-24 (×4): qty 1

## 2019-11-24 MED ORDER — ISOSORBIDE DINITRATE 10 MG PO TABS: 10.0000 mg | ORAL_TABLET | Freq: Three times a day (TID) | ORAL | Status: DC

## 2019-11-24 NOTE — Progress Notes (Signed)
Patient's friend, [name], called this RN requesting an update. [name] was updated on plan of care. She requests to be called regarding any updates. All questions and concerns were answered.

## 2019-11-24 NOTE — Progress Notes (Signed)
Per CCMD, patient had 3 beats of V-tach then immediately had 10 beat run of SVT. Patient alert and oriented, sitting in bedside chair eating lunch. Denies chest pain, shortness of breath, or dizziness. Current vitals are as follows:   11/24/19 1314  Vitals  BP 111/69  MAP (mmHg) 82  BP Location Right Arm  BP Method Automatic  Patient Position (if appropriate) Sitting  Pulse Rate 64  Pulse Rate Source Monitor  Resp 15  Level of Consciousness  Level of Consciousness Alert  Oxygen Therapy  SpO2 92 %  O2 Device Room Luna Glasgow, MD paged.

## 2019-11-24 NOTE — Progress Notes (Addendum)
Patient current vitals are as follows:   11/24/19 1414  Vitals  BP 98/69  MAP (mmHg) 77  BP Method Automatic  Pulse Rate 67  MEWS Score  MEWS Temp 0  MEWS Systolic 1  MEWS Pulse 0  MEWS RR 0  MEWS LOC 0  MEWS Score 1  MEWS Score Color Green   Patient still alert & oriented X4, but is more drowsy than morning shift assessment. No complaints from patient at this time. Thereasa Solo, MD paged regarding blood pressure medications due now and current blood pressure- medications modified.

## 2019-11-24 NOTE — Progress Notes (Addendum)
Per CCMD, patient went into a-fib with RVR with heart rate in 130's nonsustained. Current vitals are as follows:   11/24/19 1711  Vitals  Temp 98.1 F (36.7 C)  Temp Source Oral  BP 124/69  MAP (mmHg) 86  BP Location Left Arm  BP Method Automatic  Patient Position (if appropriate) Lying  Pulse Rate 65  Pulse Rate Source Monitor  Resp 16  Level of Consciousness  Level of Consciousness Alert  Oxygen Therapy  SpO2 95 %  O2 Device Room Air  MEWS Score  MEWS Temp 0  MEWS Systolic 0  MEWS Pulse 0  MEWS RR 0  MEWS LOC 0  MEWS Score 0  MEWS Score Color Green   Patient denies chest pain, shortness of breath, or dizziness. Thereasa Solo, MD paged. CCMD called this RN a second time and stated that within 35 seconds patient was in SVT/ sinus tachycardia with heart rate in 130's and previous mention of a-fib was incorrect. EKG completed and placed in patient chart.

## 2019-11-24 NOTE — Progress Notes (Signed)
Physical Therapy Treatment Patient Details Name: [name] MRN: [id number] DOB: [birth year] Today's Date: 11/24/2019    History of Present Illness [name] is a 79 yo AA male with PMH CVA (residual LLE weakness and dysarthria), HTN (medication noncompliance 2/2 financial constraints), CAD, CAS (s/p right CEA 2017), HLD, combined systolic diastolic CHF (EF [id number], GrII DD on 2018 echo) who presents to the ER with nausea, mild anxiety/irritation, "breathing hard", and a cough that is nonproductive.  Admitted with hypertensive urgency and bilat small pleural effusions per CXR.    PT Comments    Pt received in bed with c/o "my head just feels funny." Agreeable to participation in therapy. He required min assist bed mobility and min assist sit to stand with RW. Upon standing, pt became retropulsive requiring return to sitting EOB. When asked if he was dizzy, he said "no, I'm just having a hard time getting going this morning." Pt returned to supine, and then sat on opposite side of bed in preparation for SPT to recliner. He required min assist to power up and pivot bed to recliner. Pt positioned in recliner with feet elevated. Pt then began to c/o dizziness and nausea. Vitals taken, BP 94/73 and HR 68. RN arrived in room at this time due to HR decrease to 35 on monitor. Vitals again taken on her arrival, BP 140/98 and HR 68. No LOC noted during episode. Pt reporting now feeling better. Pt remained in recliner with feet elevated. RN and NT remained in room.   Follow Up Recommendations  Supervision/Assistance - 24 hour;SNF     Equipment Recommendations  None recommended by PT    Recommendations for Other Services       Precautions / Restrictions Precautions Precautions: Fall Precaution Comments: one fall a week or two prior to admission    Mobility  Bed Mobility Overal bed mobility: Needs Assistance Bed Mobility: Supine to Sit;Sit to Supine     Supine to sit: Min assist;HOB  elevated Sit to supine: Min assist   General bed mobility comments: +rail, assist to elevate trunk, assist BLE into bed, increased time  Transfers Overall transfer level: Needs assistance Equipment used: None;Rolling walker (2 wheeled) Transfers: Sit to/from [facility] Sit to Stand: Min assist Stand pivot transfers: Min assist       General transfer comment: Sit to stand from EOB with RW. Cues for hand placement. Upon standing, pt retropulsive requiring return to sit. Recliner positioned beside bed and pt performed SPT bed to recliner.  Ambulation/Gait             General Gait Details: unable to progress gait this session due to orthostatic hypotention   Stairs             Wheelchair Mobility    Modified Rankin (Stroke Patients Only)       Balance Overall balance assessment: Needs assistance Sitting-balance support: Feet supported Sitting balance-Leahy Scale: Fair     Standing balance support: Single extremity supported;During functional activity;Bilateral upper extremity supported Standing balance-Leahy Scale: Poor                              Cognition Arousal/Alertness: Awake/alert Behavior During Therapy: WFL for tasks assessed/performed Overall Cognitive Status: Within Functional Limits for tasks assessed  Exercises      General Comments General comments (skin integrity, edema, etc.): Upon initiation of mobility, pt stating "my head just feels funny," but stating 'no' when asked if he felt dizzy. After pivot transfer to recliner, pt with reports of dizziness and nausea. Pt sat in recliner with feet elevated. BP taken, 94/73. RN arrived in room at this time due to call from [place], reporting HR of 35. Vitals retaken, BP 140/98 and HR 68. No LOC noted during episode.      Pertinent Vitals/Pain Pain Assessment: No/denies pain    Home Living                       Prior Function            PT Goals (current goals can now be found in the care plan section) Acute Rehab PT Goals Patient Stated Goal: not stated Progress towards PT goals: Not progressing toward goals - comment(orthostatic)    Frequency    Min 3X/week      PT Plan Current plan remains appropriate    Co-evaluation              AM-PAC PT "6 Clicks" Mobility   Outcome Measure  Help needed turning from your back to your side while in a flat bed without using bedrails?: None Help needed moving from lying on your back to sitting on the side of a flat bed without using bedrails?: A Little Help needed moving to and from a bed to a chair (including a wheelchair)?: A Little Help needed standing up from a chair using your arms (e.g., wheelchair or bedside chair)?: A Little Help needed to walk in hospital room?: A Lot Help needed climbing 3-5 steps with a railing? : A Lot 6 Click Score: 17    End of Session Equipment Utilized During Treatment: Gait belt Activity Tolerance: Treatment limited secondary to medical complications (Comment)(orthostatic) Patient left: in chair;with chair alarm set;with call bell/phone within reach Nurse Communication: Mobility status;Other (comment)(orthostatic (dizzy, nausea)) PT Visit Diagnosis: Other abnormalities of gait and mobility (R26.89);History of falling (Z91.81)     Time: [id number] PT Time Calculation (min) (ACUTE ONLY): 33 min  Charges:  $Gait Training: 8-22 mins $Therapeutic Activity: 8-22 mins                     Lorrin Goodell, PT  Office # [phone] Pager [phone]    Lorriane Shire 11/24/2019, 11:43 AM

## 2019-11-24 NOTE — Progress Notes (Signed)
[name]  [id number] DOB: [birth year] DOA: 11/21/2019 PCP: Biagio Borg, MD    Brief Narrative:  [phone] with a history of CVA (residual left lower extremity weakness and dysarthria), HTN, CAD, R CEA 2017, HLD, and combined systolic/diastolic CHF (EF [id number] TTE) who has not been able to comply with his hypertensive medicines due to financial difficulties and presented to the ED with shortness of breath cough and anxiety.  In the ED his blood pressure was found to be as high as 239/143.  Blood pressure was refractory to oral and topical treatments and therefore nitroglycerin drip was initiated in the ED.  Fortunately the patient had no focal deficits on physical exam.  Significant Events: 4/20 admit via [facility] hypertensive emergency 4/21 nitroglycerin drip discontinued 4/21 TTE -EF 35-40% with grade 2 DD -mild right systolic failure -biatrial severe dilation -moderate aortic stenosis with a mean gradient of 8 mmHg and a valve area of 1.49 cm -mild aortic valve regurgitation  Antimicrobials:  None   Subjective: The patient had a run of SVT noted on telemetry last night.  His blood pressure is currently well controlled and at this time he is in NSR.  Oxygen saturations were stable in the mid to upper 90s.  Creatinine is slowly climbing. He had an orthostatic/precyncopal spell this morning when moving from the bed to a chair w/ PT. he denies chest pain or shortness of breath and states that overall he is feeling better.  Assessment & Plan:  Uncontrolled hypertension - hypertensive urgency Blood pressure control much improved - now off nitro drip -given orthostasis will cut back on blood pressure medicines as he is likely due to having a much higher blood pressure for protracted period of time  Acute on chronic combined systolic and diastolic CHF EF [id number] with grade 2 DD via TTE 2018 -with intermittent hypoxia we will continue to diurese before settling on maintenance dosing -net  negative approximately 1900 cc since admission -holding diuretic for now giving climbing creatinine and orthostasis  Filed Weights   11/23/19 1127 11/24/19 0100  Weight: 70.1 kg 72.8 kg    Moderate aortic stenosis Likely contributing to orthostasis in setting of overdiuresis -avoid further diuresis for now and monitor  HLD Continue previously prescribed Lipitor  CAD No acute symptoms - D2 30% and LAD 20-30% per cardiac cath 2008  Prediabetes A1c 6.2 this admission, though the patient actually did meet criteria for a formal diagnosis of diabetes in 2019 -counseled on diet during this hospital stay  History of thromboembolic CVA Baseline deficits include left lower extremity weakness and dysarthria  CKD stage III Baseline creatinine approximately 1.2 -creatinine creeping up this morning in presence of increased diuresis - hold further diuresis for now   Recent Labs  Lab 11/21/19 1844 11/22/19 0209 11/23/19 0428 11/24/19 0409  CREATININE 1.25* 1.24 1.41* 1.53*    Microcytic anemia Iron is significantly low at 28 with a ferritin of 70 -[id number] and folic acid are not abnormal -patient will need outpatient screening colonoscopy if this has not been previously accomplished -awaiting stool guaiac -records indicate a history of iron deficiency anemia dating back to January 2013 -loading with IV iron during hospital stay  Paroxysmal atrial fibrillation Currently NSR -chart suggest prior history of atrial fibrillation -patient denies any history of using anticoagulation but list of old medications include Eliquis - CHA2DS2-VASc is 8 -hemoglobin relatively stable therefore will resume Eliquis while inpatient to monitor  Apparent cognitive deficit  DVT prophylaxis: Lovenox  Code Status: FULL CODE Family Communication: Spoke at length with sister at bedside Disposition Plan: Patient was previously living independently at home but was frequently noncompliant with his medications and is  presently displaying significant cognitive deficit making me suspect that he is no longer safe to live independently -patient agrees to SNF placement for rehab and PT/OT agree therefore we are awaiting placement  Consultants:  none  Objective: Blood pressure 136/78, pulse 66, temperature 99.5 F (37.5 C), temperature source Oral, resp. rate 19, height 5\' 6"  (1.676 m), weight 72.8 kg, SpO2 99 %.  Intake/Output Summary (Last 24 hours) at 11/24/2019 0820 Last data filed at 11/24/2019 0700 Gross per 24 hour  Intake 680 ml  Output 1450 ml  Net -770 ml   Filed Weights   11/23/19 1127 11/24/19 0100  Weight: 70.1 kg 72.8 kg    Examination: General: No acute respiratory distress -alert and conversant Lungs: CTA B without wheezing Cardiovascular: RRR -no appreciable murmur Abdomen: NT/ND, soft, BS positive, no rebound Extremities: No C/C/E B LE  CBC: Recent Labs  Lab 11/21/19 1102 11/22/19 0209 11/23/19 0428  WBC 4.5 4.6 5.8  HGB 11.2* 11.6* 10.6*  HCT 38.0* 38.7* 35.1*  MCV 72.4* 71.8* 70.8*  PLT 145* 151 [id number]*   Basic Metabolic Panel: Recent Labs  Lab 11/22/19 0209 11/23/19 0428 11/24/19 0409  NA 139 137 136  K 3.5 3.8 4.3  CL 106 103 102  CO2 22 24 27   GLUCOSE 95 93 109*  BUN 20 24* 25*  CREATININE 1.24 1.41* 1.53*  CALCIUM 8.8* 8.4* 8.5*  MG 1.8  --  2.2   GFR: Estimated Creatinine Clearance: 36.5 mL/min (A) (by C-G formula based on SCr of 1.53 mg/dL (H)).  Liver Function Tests: Recent Labs  Lab 11/21/19 1844 11/23/19 0428  AST 24 16  ALT 17 13  ALKPHOS 68 55  BILITOT 0.7 0.3  PROT 7.7 6.6  ALBUMIN 3.4* 2.9*   HbA1C: Hgb A1c MFr Bld  Date/Time Value Ref Range Status  11/22/2019 02:15 AM 6.2 (H) 4.8 - 5.6 % Final    Comment:    (NOTE) Pre diabetes:          5.7%-6.4% Diabetes:              >6.4% Glycemic control for   <7.0% adults with diabetes   01/27/2018 03:08 PM 6.5 4.6 - 6.5 % Final    Comment:    Glycemic Control Guidelines for People  with Diabetes:Non Diabetic:  <6%Goal of Therapy: <7%Additional Action Suggested:  >8%      Recent Results (from the past 240 hour(s))  Respiratory Panel by RT PCR (Flu A&B, Covid) - Nasopharyngeal Swab     Status: None   Collection Time: 11/21/19  9:03 PM   Specimen: Nasopharyngeal Swab  Result Value Ref Range Status   SARS Coronavirus 2 by RT PCR NEGATIVE NEGATIVE Final    Comment: (NOTE) SARS-CoV-2 target nucleic acids are NOT DETECTED. The SARS-CoV-2 RNA is generally detectable in upper respiratoy specimens during the acute phase of infection. The lowest concentration of SARS-CoV-2 viral copies this assay can detect is 131 copies/mL. A negative result does not preclude SARS-Cov-2 infection and should not be used as the sole basis for treatment or other patient management decisions. A negative result may occur with  improper specimen collection/handling, submission of specimen other than nasopharyngeal swab, presence of viral mutation(s) within the areas targeted by this assay, and inadequate number of viral copies (<131 copies/mL). A negative  result must be combined with clinical observations, patient history, and epidemiological information. The expected result is Negative. Fact Sheet for Patients:  PinkCheek.be Fact Sheet for Healthcare Providers:  GravelBags.it This test is not yet ap proved or cleared by the [place] FDA and  has been authorized for detection and/or diagnosis of SARS-CoV-2 by FDA under an Emergency Use Authorization (EUA). This EUA will remain  in effect (meaning this test can be used) for the duration of the COVID-19 declaration under Section 564(b)(1) of the Act, 21 U.S.C. section 360bbb-3(b)(1), unless the authorization is terminated or revoked sooner.    Influenza A by PCR NEGATIVE NEGATIVE Final   Influenza B by PCR NEGATIVE NEGATIVE Final    Comment: (NOTE) The Xpert Xpress  SARS-CoV-2/FLU/RSV assay is intended as an aid in  the diagnosis of influenza from Nasopharyngeal swab specimens and  should not be used as a sole basis for treatment. Nasal washings and  aspirates are unacceptable for Xpert Xpress SARS-CoV-2/FLU/RSV  testing. Fact Sheet for Patients: PinkCheek.be Fact Sheet for Healthcare Providers: GravelBags.it This test is not yet approved or cleared by the [place] FDA and  has been authorized for detection and/or diagnosis of SARS-CoV-2 by  FDA under an Emergency Use Authorization (EUA). This EUA will remain  in effect (meaning this test can be used) for the duration of the  Covid-19 declaration under Section 564(b)(1) of the Act, 21  U.S.C. section 360bbb-3(b)(1), unless the authorization is  terminated or revoked. Performed at [facility], [place], [place], [place] [address]   MRSA PCR Screening     Status: None   Collection Time: 11/23/19  8:56 PM   Specimen: Nasal Mucosa; Nasopharyngeal  Result Value Ref Range Status   MRSA by PCR NEGATIVE NEGATIVE Final    Comment:        The GeneXpert MRSA Assay (FDA approved for NASAL specimens only), is one component of a comprehensive MRSA colonization surveillance program. It is not intended to diagnose MRSA infection nor to guide or monitor treatment for MRSA infections. Performed at [facility], [place] [address] [place], [place] [address]      Scheduled Meds: . aspirin EC  81 mg Oral Daily  . atorvastatin  80 mg Oral Daily  . carvedilol  25 mg Oral BID  . hydrALAZINE  50 mg Oral Q8H  . isosorbide dinitrate  20 mg Oral TID  . lisinopril  5 mg Oral Daily  . oxybutynin  5 mg Oral QHS  . potassium chloride  10 mEq Oral Daily  . sodium chloride flush  3 mL Intravenous Q12H  . tamsulosin  0.4 mg Oral QPC supper      LOS: 3 days   Cherene Altes, MD [facility] Office  [phone] Pager -  Text Page per Amion  If 7PM-7AM, please contact night-coverage per Amion 11/24/2019, 8:20 AM

## 2019-11-24 NOTE — TOC Initial Note (Signed)
Transition of Care [facility]) - Initial/Assessment Note    Patient Details  Name: [name] MRN: [id number] Date of Birth: April 18, 1941  [facility]) CM/SW Contact:    Zenon Mayo, RN Phone Number: 11/24/2019, 4:02 PM  Clinical Narrative:                 NCM spoke with patient, he states he does not want to go to SNF he wants to go home with [facility] services, he has hx of CVA so it is a little hard to understand him, but I heard him clearly say he does not want to go to a SNF, he states [name] his friend will transport him home at discharge. He does not have a preference for [facility] agency for [facility] services when offered choice.  NCM made referral to [place] with [facility], for med management and CHF disease management, HHPT, [place].  Awaiting call back to see if they can take referral.   Expected Discharge Plan: [place] Barriers to Discharge: Continued Medical Work up   Patient Goals and CMS Choice Patient states their goals for this hospitalization and ongoing recovery are:: get better CMS Medicare.gov Compare Post Acute Care list provided to:: Patient Choice offered to / list presented to : Patient  Expected Discharge Plan and Services Expected Discharge Plan: [place]   Discharge Planning Services: CM Consult Post Acute Care Choice: [place] arrangements for the past 2 months: Single Family Home                   DME Agency: NA       HH Arranged: RN, Disease Management, PT, OT [place] Agency: [facility] [employer]) Date [place]: 11/24/19 Time [place]: 1559 Representative spoke with at [place]: [place]  Prior Living Arrangements/Services Living arrangements for the past 2 months: [place] with:: Self Patient language and need for interpreter reviewed:: Yes Do you feel safe going back to the place where you live?: Yes        Care giver support system  in place?: No (comment)   Criminal Activity/Legal Involvement Pertinent to Current Situation/Hospitalization: No - Comment as needed  Activities of Daily Living Home Assistive Devices/Equipment: Cane (specify quad or straight) ADL Screening (condition at time of admission) Patient's cognitive ability adequate to safely complete daily activities?: Yes Is the patient deaf or have difficulty hearing?: Yes Does the patient have difficulty seeing, even when wearing glasses/contacts?: No Does the patient have difficulty concentrating, remembering, or making decisions?: Yes Patient able to express need for assistance with ADLs?: Yes Does the patient have difficulty dressing or bathing?: No Independently performs ADLs?: Yes (appropriate for developmental age) Does the patient have difficulty walking or climbing stairs?: Yes Weakness of Legs: Both Weakness of Arms/Hands: None  Permission Sought/Granted                  Emotional Assessment Appearance:: Appears stated age Attitude/Demeanor/Rapport: Engaged Affect (typically observed): Appropriate Orientation: : Oriented to Self, Oriented to Place, Oriented to  Time, Oriented to Situation Alcohol / Substance Use: Not Applicable Psych Involvement: No (comment)  Admission diagnosis:  Pulmonary congestion [R09.89] Dizziness [R42] 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 Hypertensive CHF ([place])  11/23/2019  . Hypokalemia 08/24/2018  . Nocturia more than twice per night 08/24/2018  . Fatigue 07/14/2018  . Acute on chronic combined systolic and diastolic congestive heart failure ([place]) 06/24/2018  . Thrombocytopenia ([place]) 06/23/2018  . Neuropathy 06/23/2018  . Vitamin D deficiency 01/27/2018  . Chest pain  09/18/2017  . RLS (restless legs syndrome) 09/16/2017  . Chronic combined systolic and diastolic CHF (congestive heart failure) ([place]) 04/27/2017  . 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 Cardiomegaly 04/03/2017  . History of stroke 04/03/2017  . Hypertensive heart disease with congestive heart failure ([place]) 04/03/2017  . Gait disorder 01/28/2017  . Acute on chronic congestive heart failure ([place]) 01/13/2017  . Insomnia 12/06/2016  . Left-sided weakness 12/03/2016  . Chronic anticoagulation 12/10/2015  . Sepsis secondary to UTI ([place]) 11/05/2015  . Lower urinary tract infectious disease 11/05/2015  . CAD in native artery 11/05/2015  . Left hemiplegia ([place]) 11/05/2015  . Sinus tachycardia   . 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 Labile blood pressure   . CVA (cerebral vascular accident) ([place])   . AF (paroxysmal atrial fibrillation) ([place])   . Carotid artery stenosis   . S/P carotid endarterectomy   . Coronary artery disease involving native coronary artery of native heart without angina pectoris   . HLD (hyperlipidemia)   . Acute blood loss anemia   . Near syncope   . Prediabetes 10/08/2015  . Right cavernous carotid stenosis   . Carotid stenosis   . Acute ischemic stroke ([place]) 10/07/2015  . Acute chest pain 06/19/2015  . DOE (dyspnea on exertion) 06/19/2015  . Hypertensive urgency 06/19/2015  . Bilateral arm pain 06/26/2014  . 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 Impingement syndrome of left shoulder 01/20/2013  . Atrial fibrillation ([place]) 04/18/2012  . Leg pain 01/28/2012  . Dizziness and giddiness 11/19/2011  . Dehydration 11/19/2011  . CKD (chronic kidney disease), stage III 11/19/2011  . HTN (hypertension) 11/19/2011  . Dyslipidemia 11/19/2011  . Chronic anemia 11/19/2011  . Erectile dysfunction 08/20/2011  . Nocturia 08/20/2011  . Encounter for preventative adult health care  exam with abnormal findings 08/15/2011  . Anemia, iron deficiency 08/15/2011  . History of pneumonia 08/15/2011  . CAD (coronary artery disease) 08/15/2011  . History of MI (myocardial infarction) 08/15/2011  . Dilated aortic root ([place]) 08/15/2011  . Syncope 07/16/2011  . Claudication ([place]) 07/16/2011  . BPH (benign prostatic hyperplasia) 07/16/2011  . Rash 04/29/2011  . Cerebral infarction ([place]) 03/31/2011  . Inguinal hernia 03/31/2011   PCP:  Biagio Borg, MD Pharmacy:   [facility] [address] [place]), [place] - [id number]  ([place]) [place] [address] Phone: [phone] Fax: [phone]  [place], [place] [facility] [address] [place] [address] [place] [address] Phone: [phone] Fax: [phone]     Social Determinants of Health (SDOH) Interventions    Readmission Risk Interventions No flowsheet data found.

## 2019-11-24 NOTE — Progress Notes (Addendum)
Per CCMD, patient heart rate dropped to 35. Upon entering room, patient working with physical therapist getting to bedside chair. Physical therapist states that right before patient sat in chair, he complained of being dizzy and nauseous. Patient was sat in chair and vitals were taken. Blood pressure was 94/73 heart rate was 68. PRN Zofran IV given by this RN and vitals were retaken. Current vitals are as follows:   11/24/19 1016  Vitals  BP (!) 155/79  MAP (mmHg) 101  BP Method Automatic  Pulse Rate 66  MEWS Score  MEWS Temp 0  MEWS Systolic 0  MEWS Pulse 0  MEWS RR 0  MEWS LOC 0  MEWS Score 0  MEWS Score Color Green   Patient currently sitting up in bedside chair, frequent vitals are set up, no complaints of nausea, dizziness, any pain, or shortness of breath. Thereasa Solo, MD paged- came to bedside.

## 2019-11-24 NOTE — Progress Notes (Signed)
OT Cancellation Note  Patient Details Name: [name] MRN: [id number] DOB: [birth year]   Cancelled Treatment:    Reason Eval/Treat Not Completed: Other (comment)(Pt had gotten orthostatic with HR at 35 bpm with PT durig session. Then run of SVT this PM. OT will hold until next date to initiate POC.)   Zenovia Jarred, MSOT, OTR/L [facility] Office Number: [phone] Pager: [phone]  Zenovia Jarred 11/24/2019, 4:42 PM

## 2019-11-25 LAB — CBC
HCT: 36.9 % — ABNORMAL LOW (ref 39.0–52.0)
Hemoglobin: 10.9 g/dL — ABNORMAL LOW (ref 13.0–17.0)
MCH: 21.1 pg — ABNORMAL LOW (ref 26.0–34.0)
MCHC: 29.5 g/dL — ABNORMAL LOW (ref 30.0–36.0)
MCV: 71.4 fL — ABNORMAL LOW (ref 80.0–100.0)
Platelets: 147 10*3/uL — ABNORMAL LOW (ref 150–400)
RBC: 5.17 MIL/uL (ref 4.22–5.81)
RDW: 16.5 % — ABNORMAL HIGH (ref 11.5–15.5)
WBC: 5.2 10*3/uL (ref 4.0–10.5)
nRBC: 0 % (ref 0.0–0.2)

## 2019-11-25 LAB — BASIC METABOLIC PANEL
Anion gap: 6 (ref 5–15)
BUN: 28 mg/dL — ABNORMAL HIGH (ref 8–23)
CO2: 29 mmol/L (ref 22–32)
Calcium: 8.4 mg/dL — ABNORMAL LOW (ref 8.9–10.3)
Chloride: 102 mmol/L (ref 98–111)
Creatinine, Ser: 1.5 mg/dL — ABNORMAL HIGH (ref 0.61–1.24)
GFR calc Af Amer: 51 mL/min — ABNORMAL LOW (ref >60)
GFR calc non Af Amer: 44 mL/min — ABNORMAL LOW (ref >60)
Glucose, Bld: 89 mg/dL (ref 70–99)
Potassium: 4.3 mmol/L (ref 3.5–5.1)
Sodium: 137 mmol/L (ref 135–145)

## 2019-11-25 NOTE — TOC Progression Note (Signed)
Transition of Care [facility]) - Progression Note    Patient Details  Name: [name] MRN: [id number] Date of Birth: 1941-08-02  Transition of Care [facility]) CM/SW [place], RN Phone Number: 11/25/2019, 6:02 PM  Clinical Narrative:    Patient may agree to SNF placement Fl2 form and note done.    Expected Discharge Plan: [place] Barriers to Discharge: Continued Medical Work up  Expected Discharge Plan and Services Expected Discharge Plan: [place]   Discharge Planning Services: CM Consult Post Acute Care Choice: [place] arrangements for the past 2 months: Single Family Home                   DME Agency: NA       HH Arranged: RN, Disease Management, PT, OT HH [facility]: [facility] [employer] Date [place]: 11/24/19 Time [place]: 1559 Representative spoke with at [place]: [place] ([place]) Interventions    Readmission Risk Interventions No flowsheet data found.

## 2019-11-25 NOTE — Progress Notes (Signed)
[name]  [id number] DOB: 17-Oct-1940 DOA: 11/21/2019 PCP: Biagio Borg, MD    Brief Narrative:  [phone] with a history of CVA (residual left lower extremity weakness and dysarthria), HTN, CAD, R CEA 2017, HLD, and combined systolic/diastolic CHF (EF [id number] TTE) who has not been able to comply with his hypertensive medicines due to financial difficulties and presented to the ED with shortness of breath cough and anxiety.  In the ED his blood pressure was found to be as high as 239/143.  Blood pressure was refractory to oral and topical treatments and therefore nitroglycerin drip was initiated in the ED.  Fortunately the patient had no focal deficits on physical exam.  Significant Events: 4/20 admit via [place] hypertensive emergency 4/21 nitroglycerin drip discontinued 4/21 TTE -EF 35-40% with grade 2 DD -mild right systolic failure -biatrial severe dilation -moderate aortic stenosis with a mean gradient of 8 mmHg and a valve area of 1.49 cm -mild aortic valve regurgitation  Antimicrobials:  None   Subjective: No new complaints today.  Is pleasant and conversant.  Is waxing and waning in regards to his disposition intentions.  I have counseled him at great length regarding the need for a higher level of care after discharge, even if just for a short-term rehab stay.  I have explained my concern that he will most certainly end up back in the hospital in short course if he intends to go home and live independently.  He tells me he is now willing to consider SNF placement for rehab stay with a possible decision to extend his stay longer if it goes well.  Assessment & Plan:  Uncontrolled hypertension - hypertensive urgency Blood pressure reasonably controlled at this time with the intention to be to slowly improve his blood pressure over the next 6 to 8 weeks as he did not tolerate more abrupt correction -no acute symptoms presently  Acute on chronic combined systolic and diastolic  CHF EF [id number] with grade 2 DD via TTE 2018 -appears euvolemic on exam at this time -continue to hold Lasix for now and follow closely -net negative approximately 1300 cc since admission  Filed Weights   11/23/19 1127 11/24/19 0100 11/25/19 0406  Weight: 70.1 kg 72.8 kg 69.8 kg    Moderate aortic stenosis Likely contributing to orthostasis in setting of overdiuresis -avoid further diuresis for now and monitor -very close monitoring of volume status will be important  HLD Continue previously prescribed Lipitor  CAD No acute symptoms - D2 30% and LAD 20-30% per cardiac cath 2008  Prediabetes A1c 6.2 this admission, though the patient actually did meet criteria for a formal diagnosis of diabetes in 2019 -counseled on diet during this hospital stay  History of thromboembolic CVA Baseline deficits include left lower extremity weakness and dysarthria  CKD stage III Baseline creatinine approximately 1.2 -creatinine appears to be leveling off with holding of Lasix -continue to trend  Recent Labs  Lab 11/21/19 1844 11/22/19 0209 11/23/19 0428 11/24/19 0409 11/25/19 0445  CREATININE 1.25* 1.24 1.41* 1.53* 1.50*    Microcytic anemia Iron is significantly low at 28 with a ferritin of 70 -[id number] and folic acid are not abnormal -patient will need outpatient screening colonoscopy if this has not been previously accomplished -awaiting stool guaiac -records indicate a history of iron deficiency anemia dating back to January 2013 -loading with IV iron during hospital stay  Paroxysmal atrial fibrillation Currently NSR -chart suggest prior history of atrial fibrillation -patient denies any  history of using anticoagulation but list of old medications include Eliquis - CHA2DS2-VASc is 8 -hemoglobin relatively stable therefore resumed Eliquis while inpatient to monitor  Apparent cognitive deficit At this time the patient is not yet impaired enough in my judgment to prevent him from being able to make  his own decisions, however his insight into his own illness is quite limited  DVT prophylaxis: Lovenox Code Status: FULL CODE Family Communication: Spoke at length with sister at bedside Disposition Plan: Patient was previously living independently at home but was frequently noncompliant with his medications and is presently displaying significant cognitive deficit making me suspect that he is no longer safe to live independently -patient agrees to SNF placement for rehab and PT/OT agree therefore we are awaiting placement  Consultants:  none  Objective: Blood pressure (!) 166/90, pulse 69, temperature 99.3 F (37.4 C), resp. rate 20, height 5\' 6"  (1.676 m), weight 69.8 kg, SpO2 90 %.  Intake/Output Summary (Last 24 hours) at 11/25/2019 0814 Last data filed at 11/25/2019 0554 Gross per 24 hour  Intake 897 ml  Output 875 ml  Net 22 ml   Filed Weights   11/23/19 1127 11/24/19 0100 11/25/19 0406  Weight: 70.1 kg 72.8 kg 69.8 kg    Examination: General: Alert and pleasant in no acute distress Lungs: CTA bilaterally Cardiovascular: RRR without gallop or rub Abdomen: NT/ND, soft, BS positive, no rebound Extremities: No C/C/E B lower extremities  CBC: Recent Labs  Lab 11/22/19 0209 11/23/19 0428 11/25/19 0445  WBC 4.6 5.8 5.2  HGB 11.6* 10.6* 10.9*  HCT 38.7* 35.1* 36.9*  MCV 71.8* 70.8* 71.4*  PLT 151 145* [id number]*   Basic Metabolic Panel: Recent Labs  Lab 11/22/19 0209 11/22/19 0209 11/23/19 0428 11/24/19 0409 11/25/19 0445  NA 139   < > 137 136 137  K 3.5   < > 3.8 4.3 4.3  CL 106   < > 103 102 102  CO2 22   < > 24 27 29   GLUCOSE 95   < > 93 109* 89  BUN 20   < > 24* 25* 28*  CREATININE 1.24   < > 1.41* 1.53* 1.50*  CALCIUM 8.8*   < > 8.4* 8.5* 8.4*  MG 1.8  --   --  2.2  --    < > = values in this interval not displayed.   GFR: Estimated Creatinine Clearance: 37.2 mL/min (A) (by C-G formula based on SCr of 1.5 mg/dL (H)).  Liver Function Tests: Recent Labs    Lab 11/21/19 1844 11/23/19 0428  AST 24 16  ALT 17 13  ALKPHOS 68 55  BILITOT 0.7 0.3  PROT 7.7 6.6  ALBUMIN 3.4* 2.9*   HbA1C: Hgb A1c MFr Bld  Date/Time Value Ref Range Status  11/22/2019 02:15 AM 6.2 (H) 4.8 - 5.6 % Final    Comment:    (NOTE) Pre diabetes:          5.7%-6.4% Diabetes:              >6.4% Glycemic control for   <7.0% adults with diabetes   01/27/2018 03:08 PM 6.5 4.6 - 6.5 % Final    Comment:    Glycemic Control Guidelines for People with Diabetes:Non Diabetic:  <6%Goal of Therapy: <7%Additional Action Suggested:  >8%      Recent Results (from the past 240 hour(s))  Respiratory Panel by RT PCR (Flu A&B, Covid) - Nasopharyngeal Swab     Status: None   Collection  Time: 11/21/19  9:03 PM   Specimen: Nasopharyngeal Swab  Result Value Ref Range Status   SARS Coronavirus 2 by RT PCR NEGATIVE NEGATIVE Final    Comment: (NOTE) SARS-CoV-2 target nucleic acids are NOT DETECTED. The SARS-CoV-2 RNA is generally detectable in upper respiratoy specimens during the acute phase of infection. The lowest concentration of SARS-CoV-2 viral copies this assay can detect is 131 copies/mL. A negative result does not preclude SARS-Cov-2 infection and should not be used as the sole basis for treatment or other patient management decisions. A negative result may occur with  improper specimen collection/handling, submission of specimen other than nasopharyngeal swab, presence of viral mutation(s) within the areas targeted by this assay, and inadequate number of viral copies (<131 copies/mL). A negative result must be combined with clinical observations, patient history, and epidemiological information. The expected result is Negative. Fact Sheet for Patients:  PinkCheek.be Fact Sheet for Healthcare Providers:  GravelBags.it This test is not yet ap proved or cleared by the [place] FDA and  has been authorized  for detection and/or diagnosis of SARS-CoV-2 by FDA under an Emergency Use Authorization (EUA). This EUA will remain  in effect (meaning this test can be used) for the duration of the COVID-19 declaration under Section 564(b)(1) of the Act, 21 U.S.C. section 360bbb-3(b)(1), unless the authorization is terminated or revoked sooner.    Influenza A by PCR NEGATIVE NEGATIVE Final   Influenza B by PCR NEGATIVE NEGATIVE Final    Comment: (NOTE) The Xpert Xpress SARS-CoV-2/FLU/RSV assay is intended as an aid in  the diagnosis of influenza from Nasopharyngeal swab specimens and  should not be used as a sole basis for treatment. Nasal washings and  aspirates are unacceptable for Xpert Xpress SARS-CoV-2/FLU/RSV  testing. Fact Sheet for Patients: PinkCheek.be Fact Sheet for Healthcare Providers: GravelBags.it This test is not yet approved or cleared by the [place] FDA and  has been authorized for detection and/or diagnosis of SARS-CoV-2 by  FDA under an Emergency Use Authorization (EUA). This EUA will remain  in effect (meaning this test can be used) for the duration of the  Covid-19 declaration under Section 564(b)(1) of the Act, 21  U.S.C. section 360bbb-3(b)(1), unless the authorization is  terminated or revoked. Performed at [facility], [place] [address], [place], [place] [address]   MRSA PCR Screening     Status: None   Collection Time: 11/23/19  8:56 PM   Specimen: Nasal Mucosa; Nasopharyngeal  Result Value Ref Range Status   MRSA by PCR NEGATIVE NEGATIVE Final    Comment:        The GeneXpert MRSA Assay (FDA approved for NASAL specimens only), is one component of a comprehensive MRSA colonization surveillance program. It is not intended to diagnose MRSA infection nor to guide or monitor treatment for MRSA infections. Performed at [facility], [place] [address] [place], [place] [address]      Scheduled  Meds: . apixaban  5 mg Oral BID  . aspirin EC  81 mg Oral Daily  . atorvastatin  80 mg Oral Daily  . carvedilol  12.5 mg Oral BID  . hydrALAZINE  25 mg Oral Q8H  . isosorbide dinitrate  10 mg Oral TID  . lisinopril  5 mg Oral Daily  . oxybutynin  5 mg Oral QHS  . sodium chloride flush  3 mL Intravenous Q12H  . tamsulosin  0.4 mg Oral QPC supper   . ferumoxytol 510 mg (11/24/19 1032)  LOS: 4 days   Cherene Altes, MD [facility] Office  [phone] Pager - Text Page per Amion  If 7PM-7AM, please contact night-coverage per Amion 11/25/2019, 8:14 AM

## 2019-11-25 NOTE — Progress Notes (Signed)
Spoke to [employer] at [place] at [employer]. Pateint has not seen primary doctor since Jan 2020, therefore he will not sign HH orders. Will ask cardiology if they can sign on Monday, since they will be following. Patient will need to reestablish with his PCP.

## 2019-11-25 NOTE — Evaluation (Signed)
Occupational Therapy Evaluation Patient Details Name: [name] MRN: [id number] DOB: 10-23-1940 Today's Date: 11/25/2019    History of Present Illness [name] is a 79 yo AA male with PMH CVA (residual LLE weakness and dysarthria), HTN (medication noncompliance 2/2 financial constraints), CAD, CAS (s/p right CEA 2017), HLD, combined systolic diastolic CHF (EF [id number], GrII DD on 2018 echo) who presents to the ER with nausea, mild anxiety/irritation, "breathing hard", and a cough that is nonproductive.  Admitted with hypertensive urgency and bilat small pleural effusions per CXR.   Clinical Impression   Pt PTA: Pt not able to verbalize/annunciate his PLOF. Pt currently mobilizing in room with RW with minguardA; minA overall for ADL. Pt tolerates standing x10 mins for ADL grooming/UB ADL. Pt O2 on RA, HR <115 BPM with exertion. Pt did not appear SOB or in need of rest break. Pt would benefit from continued OT skilled services. OT energy conservation technique education initiated. OT following acutely.      Follow Up Recommendations  Home health OT    Equipment Recommendations  3 in 1 bedside commode    Recommendations for Other Services       Precautions / Restrictions Precautions Precautions: Fall Precaution Comments: one fall a week or two prior to admission Restrictions Weight Bearing Restrictions: No      Mobility Bed Mobility Overal bed mobility: Needs Assistance Bed Mobility: Supine to Sit     Supine to sit: Supervision     General bed mobility comments: no physical assist required  Transfers Overall transfer level: Needs assistance Equipment used: Rolling walker (2 wheeled) Transfers: Sit to/from [facility] Sit to Stand: Min guard Stand pivot transfers: Min guard       General transfer comment: minguardA for stability    Balance Overall balance assessment: Needs assistance Sitting-balance support: Feet supported Sitting  balance-Leahy Scale: Fair     Standing balance support: Single extremity supported;During functional activity;Bilateral upper extremity supported Standing balance-Leahy Scale: Poor Standing balance comment: ADL tasks                           ADL either performed or assessed with clinical judgement   ADL Overall ADL's : At baseline                                       General ADL Comments: Pt standing at sink x10 mins for ADL and grooming routine in standing.     Vision Baseline Vision/History: Wears glasses Wears Glasses: At all times Patient Visual Report: No change from baseline Vision Assessment?: No apparent visual deficits     Perception     Praxis      Pertinent Vitals/Pain Pain Assessment: No/denies pain     Hand Dominance Right   Extremity/Trunk Assessment Upper Extremity Assessment Upper Extremity Assessment: Overall WFL for tasks assessed   Lower Extremity Assessment Lower Extremity Assessment: Overall WFL for tasks assessed   Cervical / Trunk Assessment Cervical / Trunk Assessment: Kyphotic   Communication Communication Communication: Expressive difficulties(difficult speech to understand)   Cognition Arousal/Alertness: Awake/alert Behavior During Therapy: WFL for tasks assessed/performed Overall Cognitive Status: Within Functional Limits for tasks assessed  General Comments  Pt O2 on RA, HR <115 BPM with exertion.    Exercises     Shoulder Instructions      Home Living Family/patient expects to be discharged to:: Private residence Living Arrangements: Alone Available Help at Discharge: Friend(s);Available PRN/intermittently Type of Home: House Home Access: Level entry     Home Layout: One level     Bathroom Shower/Tub: Occupational psychologist: Standard     Home Equipment: Cane - single point;Shower seat;Walker - 4 wheels          Prior  Functioning/Environment Level of Independence: Independent with assistive device(s)        Comments: uses cane mostly in community, one fall two weeks ago, got up unaided        OT Problem List: Decreased strength;Decreased activity tolerance;Impaired balance (sitting and/or standing);Decreased safety awareness;Decreased knowledge of use of DME or AE;Impaired UE functional use;Pain;Increased edema;Cardiopulmonary status limiting activity      OT Treatment/Interventions: Self-care/ADL training;Therapeutic exercise;Energy conservation;DME and/or AE instruction;Therapeutic activities;Patient/family education;Balance training    OT Goals(Current goals can be found in the care plan section) Acute Rehab OT Goals Patient Stated Goal: not stated OT Goal Formulation: With patient Time For Goal Achievement: 12/09/19 Potential to Achieve Goals: Fair ADL Goals Pt Will Perform Lower Body Dressing: with min guard assist;sitting/lateral leans;sit to/from stand Additional ADL Goal #1: Pt will complete x15 mins of OOB ADL task with 1 seated rest break in order to increase activity tolerance. Additional ADL Goal #2: Pt will verbalize 2 energy conservation techniques for education with no cues.  OT Frequency: Min 2X/week   Barriers to D/C: Decreased caregiver support  pt unable to express assist at home       Co-evaluation              AM-PAC OT "6 Clicks" Daily Activity     Outcome Measure Help from another person eating meals?: None Help from another person taking care of personal grooming?: A Little Help from another person toileting, which includes using toliet, bedpan, or urinal?: A Little Help from another person bathing (including washing, rinsing, drying)?: A Little Help from another person to put on and taking off regular upper body clothing?: None Help from another person to put on and taking off regular lower body clothing?: A Little 6 Click Score: 20   End of Session Equipment  Utilized During Treatment: Gait belt;Rolling walker Nurse Communication: Mobility status  Activity Tolerance: Patient tolerated treatment well Patient left: in chair;with call bell/phone within reach;with chair alarm set  OT Visit Diagnosis: Muscle weakness (generalized) (M62.81);Unsteadiness on feet (R26.81)                Time: [id number] OT Time Calculation (min): 39 min Charges:  OT General Charges $OT Visit: 1 Visit OT Evaluation $OT Eval Moderate Complexity: 1 Mod OT Treatments $Self Care/Home Management : 8-22 mins $Therapeutic Activity: 8-22 mins  Jefferey Pica, OTR/L Acute Rehabilitation Services Pager: [phone] Office: [phone]   Ragena Fiola C 11/25/2019, 3:31 PM

## 2019-11-25 NOTE — NC FL2 (Signed)
[place] LEVEL OF CARE SCREENING TOOL     IDENTIFICATION  Patient Name: [name] Birthdate: [birth year] Sex: male Admission Date (Current Location): 11/21/2019  [facility] and [place] Number:  Herbalist and Address:  The . [facility], [place] [address] [place], [place] [address]      Provider Number: [id number]  Attending Physician Name and Address:  Cherene Altes, MD  Relative Name and Phone Number:       Current Level of Care: Hospital Recommended Level of Care: [place] Prior Approval Number:    Date Approved/Denied:   PASRR Number: [id number]  Discharge Plan: SNF    Current Diagnoses: Patient Active Problem List   Diagnosis Date Noted  . Hypertensive CHF ([place]) 11/23/2019  . Hypokalemia 08/24/2018  . Nocturia more than twice per night 08/24/2018  . Fatigue 07/14/2018  . Acute on chronic combined systolic and diastolic congestive heart failure ([place]) 06/24/2018  . Thrombocytopenia ([place]) 06/23/2018  . Neuropathy 06/23/2018  . Vitamin D deficiency 01/27/2018  . Chest pain 09/18/2017  . RLS (restless legs syndrome) 09/16/2017  . Chronic combined systolic and diastolic CHF (congestive heart failure) ([place]) 04/27/2017  . 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 Cardiomegaly 04/03/2017  . History of stroke 04/03/2017  . Hypertensive heart disease with congestive heart failure ([place]) 04/03/2017  . Gait disorder 01/28/2017  . Acute on chronic congestive heart failure ([place]) 01/13/2017  . Insomnia 12/06/2016  . Left-sided weakness 12/03/2016  . Chronic anticoagulation 12/10/2015  . Sepsis secondary to UTI ([place]) 11/05/2015  . Lower urinary tract infectious disease 11/05/2015  . CAD in native artery 11/05/2015  . Left hemiplegia ([place]) 11/05/2015  . Sinus tachycardia   . Cerebral infarction due to thrombosis of right posterior cerebral artery ([place])   . Labile  blood pressure   . CVA (cerebral vascular accident) ([place])   . AF (paroxysmal atrial fibrillation) ([place])   . Carotid artery stenosis   . S/P carotid endarterectomy   . Coronary artery disease involving native coronary artery of native heart without angina pectoris   . HLD (hyperlipidemia)   . Acute blood loss anemia   . Near syncope   . Prediabetes 10/08/2015  . Right cavernous carotid stenosis   . Carotid stenosis   . Acute ischemic stroke ([place]) 10/07/2015  . Acute chest pain 06/19/2015  . DOE (dyspnea on exertion) 06/19/2015  . Hypertensive urgency 06/19/2015  . Bilateral arm pain 06/26/2014  . 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 Impingement syndrome of left shoulder 01/20/2013  . Atrial fibrillation ([place]) 04/18/2012  . Leg pain 01/28/2012  . Dizziness and giddiness 11/19/2011  . Dehydration 11/19/2011  . CKD (chronic kidney disease), stage III 11/19/2011  . HTN (hypertension) 11/19/2011  . Dyslipidemia 11/19/2011  . Chronic anemia 11/19/2011  . Erectile dysfunction 08/20/2011  . Nocturia 08/20/2011  . Encounter for preventative adult health care exam with abnormal findings 08/15/2011  . Anemia, iron deficiency 08/15/2011  . History of pneumonia 08/15/2011  . CAD (coronary artery disease) 08/15/2011  . History of MI (myocardial infarction) 08/15/2011  . 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 Syncope 07/16/2011  . Claudication ([place]) 07/16/2011  . BPH (benign prostatic hyperplasia) 07/16/2011  . Rash 04/29/2011  . Cerebral infarction ([place]) 03/31/2011  . Inguinal hernia 03/31/2011    Orientation RESPIRATION BLADDER Height & Weight     Self, Time, Situation, Place  Normal External catheter Weight:  69.8 kg Height:  5\' 6"  (167.6 cm)(5 ft 6 in)  BEHAVIORAL SYMPTOMS/MOOD NEUROLOGICAL BOWEL NUTRITION STATUS      Continent (See discharge summary)  AMBULATORY STATUS COMMUNICATION OF NEEDS Skin   Limited Assist Verbally Normal                       Personal Care  Assistance Level of Assistance  Dressing, Feeding, Bathing Bathing Assistance: Independent Feeding assistance: Independent Dressing Assistance: Limited assistance     Functional Limitations Info  Speech, Sight Sight Info: Adequate Hearing Info: Adequate      SPECIAL CARE FACTORS FREQUENCY  Blood pressure, PT (By licensed PT), OT (By licensed OT) Blood Pressure Frequency: weekly   PT Frequency: 5 x weekly OT Frequency: 5 x weekly            Contractures Contractures Info: Not present    Additional Factors Info                  Current Medications (11/25/2019):  This is the current hospital active medication list Current Facility-Administered Medications  Medication Dose Route Frequency Provider Last Rate Last Admin  . acetaminophen (TYLENOL) tablet 650 mg  650 mg Oral Q4H PRN Dwyane Dee, MD   650 mg at 11/22/19 1052  . apixaban (ELIQUIS) tablet 5 mg  5 mg Oral BID Cherene Altes, MD   5 mg at 11/25/19 0915  . aspirin EC tablet 81 mg  81 mg Oral Daily Dwyane Dee, MD   81 mg at 11/25/19 0915  . atorvastatin (LIPITOR) tablet 80 mg  80 mg Oral Daily Dwyane Dee, MD   80 mg at 11/25/19 0915  . carvedilol (COREG) tablet 12.5 mg  12.5 mg Oral BID Cherene Altes, MD   12.5 mg at 11/25/19 0915  . ferumoxytol (FERAHEME) 510 mg in sodium chloride 0.9 % 100 mL IVPB  510 mg Intravenous Weekly Cherene Altes, MD 468 mL/hr at 11/24/19 1032 510 mg at 11/24/19 1032  . hydrALAZINE (APRESOLINE) tablet 25 mg  25 mg Oral Q8H Cherene Altes, MD   25 mg at 11/25/19 1534  . isosorbide dinitrate (ISORDIL) tablet 10 mg  10 mg Oral TID Cherene Altes, MD   10 mg at 11/25/19 1633  . lisinopril (ZESTRIL) tablet 5 mg  5 mg Oral Daily Dwyane Dee, MD   5 mg at 11/25/19 0915  . ondansetron (ZOFRAN) injection 4 mg  4 mg Intravenous Q6H PRN Cherene Altes, MD   4 mg at 11/24/19 1014  . oxybutynin (DITROPAN-XL) 24 hr tablet 5 mg  5 mg Oral QHS Cherene Altes, MD    5 mg at 11/24/19 2204  . sodium chloride flush (NS) 0.9 % injection 3 mL  3 mL Intravenous Q12H Dwyane Dee, MD   3 mL at 11/25/19 0916  . tamsulosin (FLOMAX) capsule 0.4 mg  0.4 mg Oral QPC supper Cherene Altes, MD   0.4 mg at 11/25/19 1633     Discharge Medications: Please see discharge summary for a list of discharge medications.  Relevant Imaging Results:  Relevant Lab Results:   Additional Information [id number]  Verdell Carmine, RN

## 2019-11-26 LAB — CBC
HCT: 37.7 % — ABNORMAL LOW (ref 39.0–52.0)
Hemoglobin: 11.1 g/dL — ABNORMAL LOW (ref 13.0–17.0)
MCH: 21.2 pg — ABNORMAL LOW (ref 26.0–34.0)
MCHC: 29.4 g/dL — ABNORMAL LOW (ref 30.0–36.0)
MCV: 71.9 fL — ABNORMAL LOW (ref 80.0–100.0)
Platelets: 149 10*3/uL — ABNORMAL LOW (ref 150–400)
RBC: 5.24 MIL/uL (ref 4.22–5.81)
RDW: 16.4 % — ABNORMAL HIGH (ref 11.5–15.5)
WBC: 4.8 10*3/uL (ref 4.0–10.5)
nRBC: 0 % (ref 0.0–0.2)

## 2019-11-26 LAB — BASIC METABOLIC PANEL
Anion gap: 9 (ref 5–15)
BUN: 29 mg/dL — ABNORMAL HIGH (ref 8–23)
CO2: 23 mmol/L (ref 22–32)
Calcium: 8.6 mg/dL — ABNORMAL LOW (ref 8.9–10.3)
Chloride: 105 mmol/L (ref 98–111)
Creatinine, Ser: 1.32 mg/dL — ABNORMAL HIGH (ref 0.61–1.24)
GFR calc Af Amer: 60 mL/min — ABNORMAL LOW (ref >60)
GFR calc non Af Amer: 52 mL/min — ABNORMAL LOW (ref >60)
Glucose, Bld: 87 mg/dL (ref 70–99)
Potassium: 4.1 mmol/L (ref 3.5–5.1)
Sodium: 137 mmol/L (ref 135–145)

## 2019-11-26 MED ORDER — HYDRALAZINE HCL 50 MG PO TABS
50.0000 mg | ORAL_TABLET | Freq: Three times a day (TID) | ORAL | Status: DC
  Administered 2019-11-26 – 2019-11-28 (×6): 50 mg via ORAL
  Filled 2019-11-26 (×6): qty 1

## 2019-11-26 NOTE — Progress Notes (Addendum)
Pt is saying that he called for assistance but nobody came to help. AS per secretary, pt called the hospital operator instead. And not the nursing station  ,this happens around 5-6pm. Pt wanted his windows be  close and explained that windows has been closed and could not be open. Asked what he needs but pt stated" nothing".   Attended pt needs accordingly.

## 2019-11-26 NOTE — Progress Notes (Signed)
[name]  [id number] DOB: 27-Apr-1941 DOA: 11/21/2019 PCP: Biagio Borg, MD    Brief Narrative:  [phone] with a history of CVA (residual left lower extremity weakness and dysarthria), HTN, CAD, R CEA 2017, HLD, and combined systolic/diastolic CHF (EF [id number] TTE) who has not been able to comply with his hypertensive medicines due to financial difficulties and presented to the ED with shortness of breath cough and anxiety.  In the ED his blood pressure was found to be as high as 239/143.  Blood pressure was refractory to oral and topical treatments and therefore nitroglycerin drip was initiated in the ED.  Fortunately the patient had no focal deficits on physical exam.  Significant Events: 4/20 admit via [facility] hypertensive emergency 4/21 nitroglycerin drip discontinued 4/21 TTE -EF 35-40% with grade 2 DD -mild right systolic failure -biatrial severe dilation -moderate aortic stenosis with a mean gradient of 8 mmHg and a valve area of 1.49 cm -mild aortic valve regurgitation  Antimicrobials:  None   Subjective: The patient tells me he is now willing to go into a rehab facility, at least for a short period of time to give it a trial.  He is in good spirits today.  He denies chest pain nausea vomiting shortness of breath or abdominal pain.  Assessment & Plan:  Uncontrolled hypertension - hypertensive urgency Blood pressure reasonably controlled at this time with the intention to slowly improve his blood pressure over the next 6 to 8 weeks as he did not tolerate more abrupt correction -no acute symptoms presently  Acute on chronic combined systolic and diastolic CHF EF [id number] with grade 2 DD via TTE 2018 -appears euvolemic on exam at this time -continue to hold Lasix for now and follow closely -net negative approximately 1300 cc since admission  Filed Weights   11/24/19 0100 11/25/19 0406 11/26/19 0608  Weight: 72.8 kg 69.8 kg 67.6 kg    Moderate aortic stenosis Likely  contributing to orthostasis in setting of overdiuresis -avoid further diuresis for now and monitor -very close monitoring of volume status will be important  HLD Continue previously prescribed Lipitor  CAD No acute symptoms - D2 30% and LAD 20-30% per cardiac cath 2008  Prediabetes A1c 6.2 this admission, though the patient actually did meet criteria for a formal diagnosis of diabetes in 2019 -counseled on diet during this hospital stay though his grasp of this issue is exceedingly limited  History of thromboembolic CVA Baseline deficits include left lower extremity weakness and dysarthria -no new deficits appreciated this hospital stay  CKD stage III Baseline creatinine approximately 1.2 -creatinine appears to be leveling off with holding of Lasix -continue to trend  Recent Labs  Lab 11/22/19 0209 11/23/19 0428 11/24/19 0409 11/25/19 0445 11/26/19 0538  CREATININE 1.24 1.41* 1.53* 1.50* 1.32*    Microcytic anemia Iron is significantly low at 28 with a ferritin of 70 - [id number] and folic acid are not abnormal -patient will need outpatient screening colonoscopy if this has not been previously accomplished -awaiting stool guaiac -records indicate a history of iron deficiency anemia dating back to January 2013 - loaded with IV iron during hospital stay  Paroxysmal atrial fibrillation Currently NSR -chart suggests prior history of atrial fibrillation -patient denies any history of using anticoagulation but list of old medications includes Eliquis - CHA2DS2-VASc is 8 -hemoglobin relatively stable therefore resumed Eliquis -patient is tolerating anticoagulation thus far  Apparent cognitive deficit At this time the patient is not yet impaired enough  in my judgment to prevent him from being able to make his own decisions, however his insight into his own illness is quite limited  DVT prophylaxis: Lovenox Code Status: FULL CODE Family Communication: Spoke at length with sister at  bedside Disposition Plan: Patient was previously living independently at home but was frequently noncompliant with his medications and is presently exhibiting a significant cognitive deficit making me suspect that he is no longer safe to live independently -patient agrees to SNF placement for rehab and PT/OT agree therefore we are awaiting placement  Consultants:  none  Objective: Blood pressure (!) 178/94, pulse 64, temperature 98.6 F (37 C), temperature source Oral, resp. rate 18, height 5\' 6"  (1.676 m), weight 67.6 kg, SpO2 95 %.  Intake/Output Summary (Last 24 hours) at 11/26/2019 0904 Last data filed at 11/26/2019 0610 Gross per 24 hour  Intake 690 ml  Output 1400 ml  Net -710 ml   Filed Weights   11/24/19 0100 11/25/19 0406 11/26/19 0608  Weight: 72.8 kg 69.8 kg 67.6 kg    Examination: General: Alert and pleasant  Lungs: CTA B without wheezing Cardiovascular: RRR  Abdomen: NT/ND, soft, BS positive, no rebound Extremities: No C/C/E B LE  CBC: Recent Labs  Lab 11/23/19 0428 11/25/19 0445 11/26/19 0538  WBC 5.8 5.2 4.8  HGB 10.6* 10.9* 11.1*  HCT 35.1* 36.9* 37.7*  MCV 70.8* 71.4* 71.9*  PLT 145* 147* 123456*   Basic Metabolic Panel: Recent Labs  Lab 11/22/19 0209 11/23/19 0428 11/24/19 0409 11/25/19 0445 11/26/19 0538  NA 139   < > 136 137 137  K 3.5   < > 4.3 4.3 4.1  CL 106   < > 102 102 105  CO2 22   < > 27 29 23   GLUCOSE 95   < > 109* 89 87  BUN 20   < > 25* 28* 29*  CREATININE 1.24   < > 1.53* 1.50* 1.32*  CALCIUM 8.8*   < > 8.5* 8.4* 8.6*  MG 1.8  --  2.2  --   --    < > = values in this interval not displayed.   GFR: Estimated Creatinine Clearance: 42.3 mL/min (A) (by C-G formula based on SCr of 1.32 mg/dL (H)).  Liver Function Tests: Recent Labs  Lab 11/21/19 1844 11/23/19 0428  AST 24 16  ALT 17 13  ALKPHOS 68 55  BILITOT 0.7 0.3  PROT 7.7 6.6  ALBUMIN 3.4* 2.9*   HbA1C: Hgb A1c MFr Bld  Date/Time Value Ref Range Status   11/22/2019 02:15 AM 6.2 (H) 4.8 - 5.6 % Final    Comment:    (NOTE) Pre diabetes:          5.7%-6.4% Diabetes:              >6.4% Glycemic control for   <7.0% adults with diabetes   01/27/2018 03:08 PM 6.5 4.6 - 6.5 % Final    Comment:    Glycemic Control Guidelines for People with Diabetes:Non Diabetic:  <6%Goal of Therapy: <7%Additional Action Suggested:  >8%      Recent Results (from the past 240 hour(s))  Respiratory Panel by RT PCR (Flu A&B, Covid) - Nasopharyngeal Swab     Status: None   Collection Time: 11/21/19  9:03 PM   Specimen: Nasopharyngeal Swab  Result Value Ref Range Status   SARS Coronavirus 2 by RT PCR NEGATIVE NEGATIVE Final    Comment: (NOTE) SARS-CoV-2 target nucleic acids are NOT DETECTED. The SARS-CoV-2  RNA is generally detectable in upper respiratoy specimens during the acute phase of infection. The lowest concentration of SARS-CoV-2 viral copies this assay can detect is 131 copies/mL. A negative result does not preclude SARS-Cov-2 infection and should not be used as the sole basis for treatment or other patient management decisions. A negative result may occur with  improper specimen collection/handling, submission of specimen other than nasopharyngeal swab, presence of viral mutation(s) within the areas targeted by this assay, and inadequate number of viral copies (<131 copies/mL). A negative result must be combined with clinical observations, patient history, and epidemiological information. The expected result is Negative. Fact Sheet for Patients:  PinkCheek.be Fact Sheet for Healthcare Providers:  GravelBags.it This test is not yet ap proved or cleared by the [place] FDA and  has been authorized for detection and/or diagnosis of SARS-CoV-2 by FDA under an Emergency Use Authorization (EUA). This EUA will remain  in effect (meaning this test can be used) for the duration of  the COVID-19 declaration under Section 564(b)(1) of the Act, 21 U.S.C. section 360bbb-3(b)(1), unless the authorization is terminated or revoked sooner.    Influenza A by PCR NEGATIVE NEGATIVE Final   Influenza B by PCR NEGATIVE NEGATIVE Final    Comment: (NOTE) The Xpert Xpress SARS-CoV-2/FLU/RSV assay is intended as an aid in  the diagnosis of influenza from Nasopharyngeal swab specimens and  should not be used as a sole basis for treatment. Nasal washings and  aspirates are unacceptable for Xpert Xpress SARS-CoV-2/FLU/RSV  testing. Fact Sheet for Patients: PinkCheek.be Fact Sheet for Healthcare Providers: GravelBags.it This test is not yet approved or cleared by the [place] FDA and  has been authorized for detection and/or diagnosis of SARS-CoV-2 by  FDA under an Emergency Use Authorization (EUA). This EUA will remain  in effect (meaning this test can be used) for the duration of the  Covid-19 declaration under Section 564(b)(1) of the Act, 21  U.S.C. section 360bbb-3(b)(1), unless the authorization is  terminated or revoked. Performed at [facility], [place] [address], [place], [place] [address]   MRSA PCR Screening     Status: None   Collection Time: 11/23/19  8:56 PM   Specimen: Nasal Mucosa; Nasopharyngeal  Result Value Ref Range Status   MRSA by PCR NEGATIVE NEGATIVE Final    Comment:        The GeneXpert MRSA Assay (FDA approved for NASAL specimens only), is one component of a comprehensive MRSA colonization surveillance program. It is not intended to diagnose MRSA infection nor to guide or monitor treatment for MRSA infections. Performed at [facility], [place] [address], [place], [place] [address]      Scheduled Meds: . apixaban  5 mg Oral BID  . aspirin EC  81 mg Oral Daily  . atorvastatin  80 mg Oral Daily  . carvedilol  12.5 mg Oral BID  . hydrALAZINE  25 mg Oral Q8H  . isosorbide  dinitrate  10 mg Oral TID  . lisinopril  5 mg Oral Daily  . oxybutynin  5 mg Oral QHS  . sodium chloride flush  3 mL Intravenous Q12H  . tamsulosin  0.4 mg Oral QPC supper   . ferumoxytol 510 mg (11/24/19 1032)     LOS: 5 days   Cherene Altes, MD [facility] Office  [phone] Pager - Text Page per Amion  If 7PM-7AM, please contact night-coverage per Amion 11/26/2019, 9:04 AM

## 2019-11-26 NOTE — Discharge Instructions (Signed)

## 2019-11-27 LAB — SARS CORONAVIRUS 2 (TAT 6-24 HRS): SARS Coronavirus 2: NEGATIVE

## 2019-11-27 MED ORDER — ACETAMINOPHEN 325 MG PO TABS: 650.0000 mg | ORAL_TABLET | ORAL | Status: AC | PRN

## 2019-11-27 MED ORDER — HYDRALAZINE HCL 25 MG PO TABS
25.0000 mg | ORAL_TABLET | Freq: Once | ORAL | Status: AC
  Administered 2019-11-27: 25 mg via ORAL
  Filled 2019-11-27: qty 1

## 2019-11-27 MED ORDER — ISOSORBIDE DINITRATE 10 MG PO TABS
20.0000 mg | ORAL_TABLET | Freq: Three times a day (TID) | ORAL | Status: DC
  Administered 2019-11-27 – 2019-11-28 (×4): 20 mg via ORAL
  Filled 2019-11-27 (×4): qty 2

## 2019-11-27 MED ORDER — ISOSORBIDE DINITRATE 20 MG PO TABS: 20.0000 mg | ORAL_TABLET | Freq: Three times a day (TID) | ORAL | Status: AC

## 2019-11-27 MED ORDER — HYDRALAZINE HCL 50 MG PO TABS: 50.0000 mg | ORAL_TABLET | Freq: Three times a day (TID) | ORAL | Status: AC

## 2019-11-27 MED ORDER — CARVEDILOL 12.5 MG PO TABS: 12.5000 mg | ORAL_TABLET | Freq: Two times a day (BID) | ORAL | Status: AC

## 2019-11-27 MED ORDER — HYDRALAZINE HCL 20 MG/ML IJ SOLN
10.0000 mg | Freq: Once | INTRAMUSCULAR | Status: AC
  Administered 2019-11-27: 10 mg via INTRAVENOUS
  Filled 2019-11-27: qty 1

## 2019-11-27 NOTE — Progress Notes (Signed)
Physical Therapy Treatment Patient Details Name: [name] MRN: [id number] DOB: [birth year] Today's Date: 11/27/2019    History of Present Illness [name] is a 79 yo AA male with PMH CVA (residual LLE weakness and dysarthria), HTN (medication noncompliance 2/2 financial constraints), CAD, CAS (s/p right CEA 2017), HLD, combined systolic diastolic CHF (EF [id number], GrII DD on 2018 echo) who presents to the ER with nausea, mild anxiety/irritation, "breathing hard", and a cough that is nonproductive.  Admitted with hypertensive urgency and bilat small pleural effusions per CXR.    PT Comments    Pt required min assist bed mobility, min guard assist transfers and min guard assist ambulation 150' with RW. BP stable today. No issues with orthostatic hypotension. Pt in agreement that he needs ST SNF at d/c. Reports he is feeling weak and 'not like himself.'     Follow Up Recommendations  Supervision/Assistance - 24 hour;SNF     Equipment Recommendations  None recommended by PT    Recommendations for Other Services       Precautions / Restrictions Precautions Precautions: Fall Precaution Comments: one fall a week or two prior to admission Restrictions Weight Bearing Restrictions: No    Mobility  Bed Mobility Overal bed mobility: Needs Assistance Bed Mobility: Supine to Sit     Supine to sit: Min assist;HOB elevated     General bed mobility comments: +rail, assist to elevate trunk, use of bed pad to scoot to EOB  Transfers Overall transfer level: Needs assistance Equipment used: Rolling walker (2 wheeled) Transfers: Sit to/from Stand Sit to Stand: Min guard Stand pivot transfers: Min guard       General transfer comment: cues for hand placement, increased time to stabilize balance  Ambulation/Gait Ambulation/Gait assistance: Min assist Gait Distance (Feet): 150 Feet Assistive device: Rolling walker (2 wheeled) Gait Pattern/deviations: Step-through  pattern;Decreased stride length;Trunk flexed Gait velocity: decreased Gait velocity interpretation: <1.31 ft/sec, indicative of household ambulator General Gait Details: cues for posture and to stay close to [employer]    Modified Rankin (Stroke Patients Only)       Balance Overall balance assessment: Needs assistance Sitting-balance support: Feet supported;No upper extremity supported Sitting balance-Leahy Scale: Fair     Standing balance support: During functional activity;Bilateral upper extremity supported Standing balance-Leahy Scale: Poor Standing balance comment: reliant on UE support                            Cognition Arousal/Alertness: Awake/alert Behavior During Therapy: WFL for tasks assessed/performed Overall Cognitive Status: Impaired/Different from baseline Area of Impairment: Safety/judgement;Problem solving;Memory                     Memory: Decreased short-term memory   Safety/Judgement: Decreased awareness of safety   Problem Solving: Slow processing;Difficulty sequencing;Requires verbal cues General Comments: Pt reporting trouble with word finding.      Exercises      General Comments General comments (skin integrity, edema, etc.): Pt does not have adequate care at home to assist for ADL/IADL needs as pt fatigues easily. Pt HR <100 BPM with exertion, >95% O2 on RA; no dizziness reported.      Pertinent Vitals/Pain Pain Assessment: No/denies pain    Home Living                      Prior  Function            PT Goals (current goals can now be found in the care plan section) Acute Rehab PT Goals Patient Stated Goal: rehab then home Progress towards PT goals: Progressing toward goals    Frequency    Min 3X/week      PT Plan Current plan remains appropriate    Co-evaluation              AM-PAC PT "6 Clicks" Mobility   Outcome Measure  Help needed  turning from your back to your side while in a flat bed without using bedrails?: None Help needed moving from lying on your back to sitting on the side of a flat bed without using bedrails?: A Little Help needed moving to and from a bed to a chair (including a wheelchair)?: A Little Help needed standing up from a chair using your arms (e.g., wheelchair or bedside chair)?: A Little Help needed to walk in hospital room?: A Little Help needed climbing 3-5 steps with a railing? : A Lot 6 Click Score: 18    End of Session Equipment Utilized During Treatment: Gait belt Activity Tolerance: Patient tolerated treatment well Patient left: in chair;Other (comment)(at sink with OT) Nurse Communication: Mobility status PT Visit Diagnosis: Other abnormalities of gait and mobility (R26.89);History of falling (Z91.81)     Time: [id number] PT Time Calculation (min) (ACUTE ONLY): 20 min  Charges:  $Gait Training: 8-22 mins                     Lorrin Goodell, PT  Office # [phone] Pager [phone]    Lorriane Shire 11/27/2019, 12:15 PM

## 2019-11-27 NOTE — Progress Notes (Signed)
[name]  [id number] DOB: 1941/07/08 DOA: 11/21/2019 PCP: Biagio Borg, MD    Brief Narrative:  [phone] with a history of CVA (residual left lower extremity weakness and dysarthria), HTN, CAD, R CEA 2017, HLD, and combined systolic/diastolic CHF (EF [id number] TTE) who has not been able to comply with his hypertensive medicines due to financial difficulties and presented to the ED with shortness of breath cough and anxiety.  In the ED his blood pressure was found to be as high as 239/143.  Blood pressure was refractory to oral and topical treatments and therefore nitroglycerin drip was initiated in the ED.  Fortunately the patient had no focal deficits on physical exam.  Significant Events: 4/20 admit via [facility] hypertensive emergency 4/21 nitroglycerin drip discontinued 4/21 TTE -EF 35-40% with grade 2 DD -mild right systolic failure -biatrial severe dilation -moderate aortic stenosis with a mean gradient of 8 mmHg and a valve area of 1.49 cm -mild aortic valve regurgitation  Antimicrobials:  None   Subjective: Blood pressure has trended back up.  He is examined while standing at the sink shaving this morning.  He appears stable.  He denies shortness of breath chest pain nausea vomiting.  He tells me he now fully agrees with a trial stay in an SNF.  Assessment & Plan:  Uncontrolled hypertension - hypertensive urgency Blood pressure reasonably controlled at this time with the intention to slowly improve his blood pressure over the next 6 to 8 weeks as he did not tolerate more abrupt correction -no acute symptoms presently -titrating blood pressure meds again today -close monitoring of his blood pressure will be necessary in the outpatient setting with gradual improvement in his control over weeks time as he becomes symptomatic with abrupt correction, especially in setting of aortic stenosis  Acute on chronic combined systolic and diastolic CHF EF [id number] with grade 2 DD via TTE 2018  -appears euvolemic on exam at this time -continue to hold Lasix for now and follow closely -net negative approximately 3400 cc since admission  Filed Weights   11/25/19 0406 11/26/19 0608 11/27/19 0427  Weight: 69.8 kg 67.6 kg 74.2 kg    Moderate aortic stenosis Likely contributing to orthostasis in setting of overdiuresis -avoid further diuresis for now and monitor -very close monitoring of volume status will be important -outpatient follow-up with cardiology would be appropriate though I suspect he is not a candidate for intervention given his potential cognitive issues  HLD Continue previously prescribed Lipitor  CAD No acute symptoms - D2 30% and LAD 20-30% per cardiac cath 2008  Prediabetes A1c 6.2 this admission, though the patient actually did meet criteria for a formal diagnosis of diabetes in 2019 -counseled on diet during this hospital stay though his grasp of this issue is exceedingly limited  History of thromboembolic CVA Baseline deficits include left lower extremity weakness and dysarthria -no new deficits appreciated this hospital stay  CKD stage III Baseline creatinine approximately 1.2 -creatinine appears to be leveling off with holding of Lasix -will need to be monitored intermittently  Recent Labs  Lab 11/22/19 0209 11/23/19 0428 11/24/19 0409 11/25/19 0445 11/26/19 0538  CREATININE 1.24 1.41* 1.53* 1.50* 1.32*    Microcytic anemia Iron is significantly low at 28 with a ferritin of 70 - [id number] and folic acid are not abnormal -patient will need outpatient screening colonoscopy if this has not been previously accomplished -awaiting stool guaiac -records indicate a history of iron deficiency anemia dating back to January  2013 - loaded with IV iron during hospital stay  Paroxysmal atrial fibrillation Currently NSR -chart suggests prior history of atrial fibrillation -patient denies any history of using anticoagulation but list of old medications includes Eliquis -  CHA2DS2-VASc is 8 -hemoglobin relatively stable therefore resumed Eliquis during this hospital stay with no gross evidence of blood loss or worsening anemia  Apparent cognitive deficit At this time the patient is not yet impaired enough in my judgment to prevent him from being able to make his own decisions, however his insight into his own illness is quite limited -outpatient monitoring will be indicated  DVT prophylaxis: Lovenox Code Status: FULL CODE Family Communication: Spoke at length with sister at bedside Disposition Plan: Patient was previously living independently at home but was frequently noncompliant with his medications and is presently exhibiting a significant cognitive deficit making me suspect that he is no longer safe to live independently -patient agrees to SNF placement for rehab and PT/OT agree therefore we are awaiting placement  Consultants:  none  Objective: Blood pressure (!) 197/99, pulse 74, temperature 98.2 F (36.8 C), temperature source Oral, resp. rate 20, height 5\' 6"  (1.676 m), weight 74.2 kg, SpO2 95 %.  Intake/Output Summary (Last 24 hours) at 11/27/2019 0938 Last data filed at 11/27/2019 0522 Gross per 24 hour  Intake 938 ml  Output 1825 ml  Net -887 ml   Filed Weights   11/25/19 0406 11/26/19 0608 11/27/19 0427  Weight: 69.8 kg 67.6 kg 74.2 kg    Examination: General: Alert and pleasant  Lungs: Clear to auscultation bilaterally without wheezing Cardiovascular: RRR without murmur or rub Abdomen: NT/ND, soft, BS positive, no rebound Extremities: No C/C/E B lower extremities  CBC: Recent Labs  Lab 11/23/19 0428 11/25/19 0445 11/26/19 0538  WBC 5.8 5.2 4.8  HGB 10.6* 10.9* 11.1*  HCT 35.1* 36.9* 37.7*  MCV 70.8* 71.4* 71.9*  PLT 145* 147* 123456*   Basic Metabolic Panel: Recent Labs  Lab 11/22/19 0209 11/23/19 0428 11/24/19 0409 11/25/19 0445 11/26/19 0538  NA 139   < > 136 137 137  K 3.5   < > 4.3 4.3 4.1  CL 106   < > 102 102 105   CO2 22   < > 27 29 23   GLUCOSE 95   < > 109* 89 87  BUN 20   < > 25* 28* 29*  CREATININE 1.24   < > 1.53* 1.50* 1.32*  CALCIUM 8.8*   < > 8.5* 8.4* 8.6*  MG 1.8  --  2.2  --   --    < > = values in this interval not displayed.   GFR: Estimated Creatinine Clearance: 42.3 mL/min (A) (by C-G formula based on SCr of 1.32 mg/dL (H)).  Liver Function Tests: Recent Labs  Lab 11/21/19 1844 11/23/19 0428  AST 24 16  ALT 17 13  ALKPHOS 68 55  BILITOT 0.7 0.3  PROT 7.7 6.6  ALBUMIN 3.4* 2.9*   HbA1C: Hgb A1c MFr Bld  Date/Time Value Ref Range Status  11/22/2019 02:15 AM 6.2 (H) 4.8 - 5.6 % Final    Comment:    (NOTE) Pre diabetes:          5.7%-6.4% Diabetes:              >6.4% Glycemic control for   <7.0% adults with diabetes   01/27/2018 03:08 PM 6.5 4.6 - 6.5 % Final    Comment:    Glycemic Control Guidelines for People with  Diabetes:Non Diabetic:  <6%Goal of Therapy: <7%Additional Action Suggested:  >8%      Recent Results (from the past 240 hour(s))  Respiratory Panel by RT PCR (Flu A&B, Covid) - Nasopharyngeal Swab     Status: None   Collection Time: 11/21/19  9:03 PM   Specimen: Nasopharyngeal Swab  Result Value Ref Range Status   SARS Coronavirus 2 by RT PCR NEGATIVE NEGATIVE Final    Comment: (NOTE) SARS-CoV-2 target nucleic acids are NOT DETECTED. The SARS-CoV-2 RNA is generally detectable in upper respiratoy specimens during the acute phase of infection. The lowest concentration of SARS-CoV-2 viral copies this assay can detect is 131 copies/mL. A negative result does not preclude SARS-Cov-2 infection and should not be used as the sole basis for treatment or other patient management decisions. A negative result may occur with  improper specimen collection/handling, submission of specimen other than nasopharyngeal swab, presence of viral mutation(s) within the areas targeted by this assay, and inadequate number of viral copies (<131 copies/mL). A negative  result must be combined with clinical observations, patient history, and epidemiological information. The expected result is Negative. Fact Sheet for Patients:  PinkCheek.be Fact Sheet for Healthcare Providers:  GravelBags.it This test is not yet ap proved or cleared by the [place] FDA and  has been authorized for detection and/or diagnosis of SARS-CoV-2 by FDA under an Emergency Use Authorization (EUA). This EUA will remain  in effect (meaning this test can be used) for the duration of the COVID-19 declaration under Section 564(b)(1) of the Act, 21 U.S.C. section 360bbb-3(b)(1), unless the authorization is terminated or revoked sooner.    Influenza A by PCR NEGATIVE NEGATIVE Final   Influenza B by PCR NEGATIVE NEGATIVE Final    Comment: (NOTE) The Xpert Xpress SARS-CoV-2/FLU/RSV assay is intended as an aid in  the diagnosis of influenza from Nasopharyngeal swab specimens and  should not be used as a sole basis for treatment. Nasal washings and  aspirates are unacceptable for Xpert Xpress SARS-CoV-2/FLU/RSV  testing. Fact Sheet for Patients: PinkCheek.be Fact Sheet for Healthcare Providers: GravelBags.it This test is not yet approved or cleared by the [place] FDA and  has been authorized for detection and/or diagnosis of SARS-CoV-2 by  FDA under an Emergency Use Authorization (EUA). This EUA will remain  in effect (meaning this test can be used) for the duration of the  Covid-19 declaration under Section 564(b)(1) of the Act, 21  U.S.C. section 360bbb-3(b)(1), unless the authorization is  terminated or revoked. Performed at [facility], [place] [address] [place], [place] [address]   MRSA PCR Screening     Status: None   Collection Time: 11/23/19  8:56 PM   Specimen: Nasal Mucosa; Nasopharyngeal  Result Value Ref Range Status   MRSA by PCR NEGATIVE  NEGATIVE Final    Comment:        The GeneXpert MRSA Assay (FDA approved for NASAL specimens only), is one component of a comprehensive MRSA colonization surveillance program. It is not intended to diagnose MRSA infection nor to guide or monitor treatment for MRSA infections. Performed at [facility], [place] [address], [place], [place] [address]      Scheduled Meds: . apixaban  5 mg Oral BID  . atorvastatin  80 mg Oral Daily  . carvedilol  12.5 mg Oral BID  . hydrALAZINE  50 mg Oral Q8H  . isosorbide dinitrate  20 mg Oral TID  . lisinopril  5 mg Oral Daily  .  oxybutynin  5 mg Oral QHS  . sodium chloride flush  3 mL Intravenous Q12H  . tamsulosin  0.4 mg Oral QPC supper   . ferumoxytol 510 mg (11/24/19 1032)     LOS: 6 days   Cherene Altes, MD [facility] Office  [phone] Pager - Text Page per Amion  If 7PM-7AM, please contact night-coverage per Amion 11/27/2019, 9:38 AM

## 2019-11-27 NOTE — Care Management Important Message (Signed)
Important Message  Patient Details  Name: [name] MRN: [id number] Date of Birth: 24-Jan-1941   Medicare Important Message Given:  Yes     [name] 11/27/2019, 8:31 AM

## 2019-11-27 NOTE — TOC Progression Note (Addendum)
Transition of Care [facility]) - Progression Note    Patient Details  Name: [name] MRN: [id number] Date of Birth: 22-Feb-1941  Transition of Care [facility]) CM/SW Contact  Zenon Mayo, RN Phone Number: 11/27/2019, 9:20 AM  Clinical Narrative:    Patient is now willing to go to SNF, NCM spoke with he and friend [name] on the phone, she states he has a bsc, 3 n1 , walker and a cane at home.  Patient states he gives this NCM permission to fax information out to see what bed offers he will receive.  He has received 3 bed offers, he states he wants to go to [facility]. NCM contacted Levada Dy with [facility] and she will look over information to let this NCM know if she can take him or not.    Expected Discharge Plan: [place] Barriers to Discharge: Continued Medical Work up  Expected Discharge Plan and Services Expected Discharge Plan: [place]   Discharge Planning Services: CM Consult Post Acute Care Choice: [place] arrangements for the past 2 months: Single Family Home                   DME Agency: NA       HH Arranged: RN, Disease Management, PT, OT HH Agency: [facility] [employer] Date [place]: 11/24/19 Time [place]: 1559 Representative spoke with at [place]: [place] ([place]) Interventions    Readmission Risk Interventions No flowsheet data found.

## 2019-11-27 NOTE — Progress Notes (Addendum)
Patient BP still elevated at  196/88 after scheduled meds given.  No ordered prns.  APP notified.      0037- single dose of 10mg  IV hydralazine ordered  0120-repeat BP 160/71

## 2019-11-27 NOTE — Progress Notes (Signed)
Occupational Therapy Treatment Patient Details Name: [name] MRN: [id number] DOB: [birth year] Today's Date: 11/27/2019    History of present illness [name] is a 79 yo AA male with PMH CVA (residual LLE weakness and dysarthria), HTN (medication noncompliance 2/2 financial constraints), CAD, CAS (s/p right CEA 2017), HLD, combined systolic diastolic CHF (EF 50-09%, GrII DD on 2018 echo) who presents to the ER with nausea, mild anxiety/irritation, "breathing hard", and a cough that is nonproductive.  Admitted with hypertensive urgency and bilat small pleural effusions per CXR.   OT comments  Pt seen for updated note as pt reports increased weakness. Pt requires continues to require minguardA for safety with ADL and mobility. Pt standing at sink x13 mins for ADL bathing, shaving and brushng teeth routine. Pt fatigues easily, but does not require a seated rset break, only standing rest breaks. Pt unable to have proper care at home for ADL and mobility at this time. Pt continues to require cues to walk closer to RW and fatigues very quickly, thus pt is unsafe to be home alone and would require post acute care therapy. OT following acutely.  Pt's Pt O2 on RA, HR <100 BPM with exertion. No dizziness reported.   Follow Up Recommendations  SNF    Equipment Recommendations  Other (comment)(defer)    Recommendations for Other Services      Precautions / Restrictions Precautions Precautions: Fall Precaution Comments: one fall a week or two prior to admission Restrictions Weight Bearing Restrictions: No       Mobility Bed Mobility               General bed mobility comments: just finished walking with PT and OT met pt at sink.  Transfers Overall transfer level: Needs assistance Equipment used: Rolling walker (2 wheeled) Transfers: Sit to/from [facility] Sit to Stand: Min guard Stand pivot transfers: Min guard       General transfer comment: minguardA  for stability    Balance Overall balance assessment: Needs assistance Sitting-balance support: Feet supported Sitting balance-Leahy Scale: Fair     Standing balance support: Single extremity supported;During functional activity;Bilateral upper extremity supported Standing balance-Leahy Scale: Poor Standing balance comment: ADL tasks                           ADL either performed or assessed with clinical judgement   ADL Overall ADL's : Needs assistance/impaired Eating/Feeding: Modified independent;Sitting   Grooming: Supervision/safety;Standing Grooming Details (indicate cue type and reason): chair behind him for safety- appears steady with stooped posture and single UE supported Upper Body Bathing: Supervision/ safety;Standing   Lower Body Bathing: Min guard;Sitting/lateral leans;Sit to/from stand   Upper Body Dressing : Supervision/safety;Standing   Lower Body Dressing: Min guard;Sitting/lateral leans;Sit to/from stand   Toilet Transfer: Min guard;Ambulation;RW;Regular Toilet   Toileting- Water quality scientist and Hygiene: Min guard;Sitting/lateral lean;Sit to/from stand;Cueing for safety       Functional mobility during ADLs: Min guard;Rolling walker;Cueing for safety;Cueing for sequencing General ADL Comments: Pt requires continues to require minguardA for safety with ADL and mobility. Pt standing at sink x13 mins for ADL bathing, shaving and brushng teeth routine. Pt fatigues easily, but does not require a seated rset break, only standing rest breaks.     Vision   Vision Assessment?: No apparent visual deficits Additional Comments: wears glasses   Perception     Praxis      Cognition Arousal/Alertness: Awake/alert Behavior During Therapy: [place]  for tasks assessed/performed Overall Cognitive Status: Within Functional Limits for tasks assessed                                          Exercises     Shoulder Instructions        General Comments Pt does not have adequate care at home to assist for ADL/IADL needs as pt fatigues easily. Pt HR <100 BPM with exertion, >95% O2 on RA; no dizziness reported.    Pertinent Vitals/ Pain       Pain Assessment: No/denies pain  Home Living                                          Prior Functioning/Environment              Frequency  Min 2X/week        Progress Toward Goals  OT Goals(current goals can now be found in the care plan section)  Progress towards OT goals: Progressing toward goals  Acute Rehab OT Goals Patient Stated Goal: "to go to rehab. I don't feel strong enough for home. " OT Goal Formulation: With patient Time For Goal Achievement: 12/09/19 Potential to Achieve Goals: Fair ADL Goals Pt Will Perform Lower Body Dressing: with min guard assist;sitting/lateral leans;sit to/from stand Additional ADL Goal #1: Pt will complete x15 mins of OOB ADL task with 1 seated rest break in order to increase activity tolerance. Additional ADL Goal #2: Pt will verbalize 2 energy conservation techniques for education with no cues.  Plan Discharge plan needs to be updated    Co-evaluation                 AM-PAC OT "6 Clicks" Daily Activity     Outcome Measure   Help from another person eating meals?: None Help from another person taking care of personal grooming?: A Little Help from another person toileting, which includes using toliet, bedpan, or urinal?: A Little Help from another person bathing (including washing, rinsing, drying)?: A Little Help from another person to put on and taking off regular upper body clothing?: None Help from another person to put on and taking off regular lower body clothing?: A Little 6 Click Score: 20    End of Session Equipment Utilized During Treatment: Gait belt;Rolling walker  OT Visit Diagnosis: Muscle weakness (generalized) (M62.81);Unsteadiness on feet (R26.81)   Activity Tolerance Patient  tolerated treatment well   Patient Left in chair;with call bell/phone within reach;with chair alarm set   Nurse Communication Mobility status        Time: 1062-6948 OT Time Calculation (min): 28 min  Charges: OT General Charges $OT Visit: 1 Visit OT Treatments $Self Care/Home Management : 23-37 mins  Jefferey Pica, OTR/L Acute Rehabilitation Services Pager: [phone] Office: [phone]    [name] 11/27/2019, 11:42 AM

## 2019-11-27 NOTE — Discharge Summary (Signed)
DISCHARGE SUMMARY  [name]  MR#: [id number]  DOB:1941/01/18  Date of Admission: 11/21/2019 Date of Discharge: 11/27/2019  Attending Physician:Shaquille Janes Hennie Duos, MD  Patient's TE:[id number], Hunt Oris, MD  Consults: none   Disposition: D/C to SNF   Follow-up Appts: Follow-up Information    Biagio Borg, MD.   Specialties: Internal Medicine, Radiology Contact information: [place] [address] [phone]           Tests Needing Follow-up: -Further titration of his blood pressure medications will likely be indicated with close monitoring of his BP -Monitor ins and outs and determine if Lasix needs to be resumed -Consider outpatient follow-up with cardiology for evaluation of moderate aortic stenosis -Monitor creatinine intermittently -Consider outpatient screening colonoscopy -Monitor hemoglobin/anemia -Monitor tolerance of ongoing Eliquis dosing -Monitor control of atrial fibrillation -Follow cognitive state  Discharge Diagnoses: Uncontrolled hypertension - hypertensive urgency 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  In the ED his blood pressure was found to be as high as 239/143.  Blood pressure was refractory to oral and topical treatments and therefore nitroglycerin drip was initiated in the ED.  Fortunately the patient had no focal deficits on physical exam.  Hospital Course: 4/20 admit via [facility] ED hypertensive  emergency 4/21 nitroglycerin drip discontinued 4/21 TTE -EF 35-40% with grade 2 DD -mild right systolic failure -biatrial severe dilation -moderate aortic stenosis with a mean gradient of 8 mmHg and a valve area of 1.49 cm -mild aortic valve regurgitation 4/27 D/C to SNF  Uncontrolled hypertension - hypertensive urgency Blood pressure reasonably controlled at this time with the intention to slowly improve his blood pressure over the next 6 to 8 weeks as he did not tolerate more abrupt correction -close monitoring of his blood pressure will be necessary in the outpatient setting with gradual improvement in his control over weeks time as he becomes symptomatic with abrupt correction, especially in setting of aortic stenosis  Acute on chronic combined systolic and diastolic CHF EF [id number] with grade 2 DD via TTE 2018 -appears euvolemic on exam at this time -continue to hold Lasix for now and follow closely -net negative approximately 3400 cc since admission  Moderate aortic stenosis Likely contributing to orthostasis in setting of overdiuresis -avoid further diuresis for now and monitor -very close monitoring of volume status will be important -outpatient follow-up with cardiology would be appropriate though I suspect he is not a candidate for intervention given his potential cognitive issues  HLD Continue previously prescribed Lipitor  CAD No acute symptoms - D2 30% and LAD 20-30% per cardiac cath 2008  Prediabetes A1c 6.2 this admission, though the patient actually did meet criteria for a formal diagnosis of diabetes in 2019 -counseled on diet during this hospital stay though his grasp of this issue is exceedingly limited  History of thromboembolic CVA Baseline deficits include left lower extremity weakness and dysarthria -no new deficits appreciated this hospital stay  CKD stage III Baseline creatinine approximately 1.2 -creatinine appears to be leveling off with holding of  Lasix -will  need to be monitored intermittently  Microcytic anemia Iron is significantly low at 28 with a ferritin of 70 - [id number] and folic acid are not abnormal -patient will need outpatient screening colonoscopy if this has not been previously accomplished -awaiting stool guaiac -records indicate a history of iron deficiency anemia dating back to January 2013 - loaded with IV iron during hospital stay  Paroxysmal atrial fibrillation Currently NSR -chart suggests prior history of atrial fibrillation -patient denies any history of using anticoagulation but list of old medications includes Eliquis - CHA2DS2-VASc is 8 -hemoglobin relatively stable therefore resumed Eliquis during this hospital stay with no gross evidence of blood loss or worsening anemia  Apparent cognitive deficit At this time the patient is not yet impaired enough in my judgment to prevent him from being able to make his own decisions, however his insight into his own illness is quite limited -outpatient monitoring will be indicated    Allergies as of 11/27/2019   No Known Allergies     Medication List    STOP taking these medications   aspirin EC 81 MG tablet   furosemide 20 MG tablet Commonly known as: LASIX   isosorbide mononitrate 60 MG 24 hr tablet Commonly known as: IMDUR   potassium chloride 10 MEQ tablet Commonly known as: Klor-Con 10     TAKE these medications   acetaminophen 325 MG tablet Commonly known as: TYLENOL Take 2 tablets (650 mg total) by mouth every 4 (four) hours as needed for mild pain, moderate pain or headache.   apixaban 5 MG Tabs tablet Commonly known as: ELIQUIS Take 1 tablet (5 mg total) by mouth 2 (two) times daily for 30 days.   atorvastatin 80 MG tablet Commonly known as: LIPITOR Take 1 tablet by mouth once daily   carvedilol 12.5 MG tablet Commonly known as: COREG Take 1 tablet (12.5 mg total) by mouth 2 (two) times daily. What changed:   medication strength  how much to take     ferrous sulfate 325 (65 FE) MG tablet Take 1 tablet (325 mg total) by mouth 2 (two) times daily with a meal for 30 days.   hydrALAZINE 50 MG tablet Commonly known as: APRESOLINE Take 1 tablet (50 mg total) by mouth every 8 (eight) hours.   isosorbide dinitrate 20 MG tablet Commonly known as: ISORDIL Take 1 tablet (20 mg total) by mouth 3 (three) times daily.   lisinopril 5 MG tablet Commonly known as: ZESTRIL Take 1 tablet (5 mg total) by mouth daily.   oxybutynin 5 MG 24 hr tablet Commonly known as: DITROPAN-XL Take 1 tablet (5 mg total) by mouth at bedtime.   Prednisolon-Gatiflox-Bromfenac 1-0.5-0.075 % Susp Place 1 drop into the left eye 4 (four) times daily.   tamsulosin 0.4 MG Caps capsule Commonly known as: FLOMAX Take 1 capsule (0.4 mg total) by mouth daily after supper.       Day of Discharge BP (!) 151/69 (BP Location: Right Arm)   Pulse 68   Temp 98.1 F (36.7 C) (Oral)   Resp 18   Ht 5\' 6"  (1.676 m) Comment: 5 ft 6 in  Wt 74.2 kg Comment: scale a  SpO2 96%   BMI 26.41 kg/m   Physical Exam: General: No acute respiratory distress Lungs: Clear to auscultation bilaterally without wheezes or crackles Cardiovascular: Regular rate without murmur gallop or rub normal S1 and S2 Abdomen: Nontender, nondistended, soft, bowel sounds positive, no rebound, no ascites, no appreciable mass Extremities:  No significant cyanosis, clubbing, or edema bilateral lower extremities  Basic Metabolic Panel: Recent Labs  Lab 11/22/19 0209 11/23/19 0428 11/24/19 0409 11/25/19 0445 11/26/19 0538  NA 139 137 136 137 137  K 3.5 3.8 4.3 4.3 4.1  CL 106 103 102 102 105  CO2 22 24 27 29 23   GLUCOSE 95 93 109* 89 87  BUN 20 24* 25* 28* 29*  CREATININE 1.24 1.41* 1.53* 1.50* 1.32*  CALCIUM 8.8* 8.4* 8.5* 8.4* 8.6*  MG 1.8  --  2.2  --   --     Liver Function Tests: Recent Labs  Lab 11/21/19 1844 11/23/19 0428  AST 24 16  ALT 17 13  ALKPHOS 68 55  BILITOT 0.7 0.3   PROT 7.7 6.6  ALBUMIN 3.4* 2.9*    CBC: Recent Labs  Lab 11/21/19 1102 11/22/19 0209 11/23/19 0428 11/25/19 0445 11/26/19 0538  WBC 4.5 4.6 5.8 5.2 4.8  HGB 11.2* 11.6* 10.6* 10.9* 11.1*  HCT 38.0* 38.7* 35.1* 36.9* 37.7*  MCV 72.4* 71.8* 70.8* 71.4* 71.9*  PLT 145* 151 145* 147* 149*    BNP (last 3 results) Recent Labs    11/21/19 1559  BNP 319.9*    Recent Results (from the past 240 hour(s))  Respiratory Panel by RT PCR (Flu A&B, Covid) - Nasopharyngeal Swab     Status: None   Collection Time: 11/21/19  9:03 PM   Specimen: Nasopharyngeal Swab  Result Value Ref Range Status   SARS Coronavirus 2 by RT PCR NEGATIVE NEGATIVE Final    Comment: (NOTE) SARS-CoV-2 target nucleic acids are NOT DETECTED. The SARS-CoV-2 RNA is generally detectable in upper respiratoy specimens during the acute phase of infection. The lowest concentration of SARS-CoV-2 viral copies this assay can detect is 131 copies/mL. A negative result does not preclude SARS-Cov-2 infection and should not be used as the sole basis for treatment or other patient management decisions. A negative result may occur with  improper specimen collection/handling, submission of specimen other than nasopharyngeal swab, presence of viral mutation(s) within the areas targeted by this assay, and inadequate number of viral copies (<131 copies/mL). A negative result must be combined with clinical observations, patient history, and epidemiological information. The expected result is Negative. Fact Sheet for Patients:  PinkCheek.be Fact Sheet for Healthcare Providers:  GravelBags.it This test is not yet ap proved or cleared by the [place] FDA and  has been authorized for detection and/or diagnosis of SARS-CoV-2 by FDA under an Emergency Use Authorization (EUA). This EUA will remain  in effect (meaning this test can be used) for the duration of the COVID-19  declaration under Section 564(b)(1) of the Act, 21 U.S.C. section 360bbb-3(b)(1), unless the authorization is terminated or revoked sooner.    Influenza A by PCR NEGATIVE NEGATIVE Final   Influenza B by PCR NEGATIVE NEGATIVE Final    Comment: (NOTE) The Xpert Xpress SARS-CoV-2/FLU/RSV assay is intended as an aid in  the diagnosis of influenza from Nasopharyngeal swab specimens and  should not be used as a sole basis for treatment. Nasal washings and  aspirates are unacceptable for Xpert Xpress SARS-CoV-2/FLU/RSV  testing. Fact Sheet for Patients: PinkCheek.be Fact Sheet for Healthcare Providers: GravelBags.it This test is not yet approved or cleared by the [place] FDA and  has been authorized for detection and/or diagnosis of SARS-CoV-2 by  FDA under an Emergency Use Authorization (EUA). This EUA will remain  in effect (meaning this test can be used) for the duration of the  Covid-19 declaration under Section 564(b)(1) of the Act, 21  U.S.C. section 360bbb-3(b)(1), unless the authorization is  terminated or revoked. Performed at [facility], [place] [address] [place], [place] [address]   MRSA PCR Screening     Status: None   Collection Time: 11/23/19  8:56 PM   Specimen: Nasal Mucosa; Nasopharyngeal  Result Value Ref Range Status   MRSA by PCR NEGATIVE NEGATIVE Final    Comment:        The GeneXpert MRSA Assay (FDA approved for NASAL specimens only), is one component of a comprehensive MRSA colonization surveillance program. It is not intended to diagnose MRSA infection nor to guide or monitor treatment for MRSA infections. Performed at [facility], [place] [address], [place], [place] [address]   SARS CORONAVIRUS 2 (TAT 6-24 HRS) Nasopharyngeal Nasopharyngeal Swab     Status: None   Collection Time: 11/27/19 11:41 AM   Specimen: Nasopharyngeal Swab  Result Value Ref Range Status   SARS Coronavirus 2  NEGATIVE NEGATIVE Final    Comment: (NOTE) SARS-CoV-2 target nucleic acids are NOT DETECTED. The SARS-CoV-2 RNA is generally detectable in upper and lower respiratory specimens during the acute phase of infection. Negative results do not preclude SARS-CoV-2 infection, do not rule out co-infections with other pathogens, and should not be used as the sole basis for treatment or other patient management decisions. Negative results must be combined with clinical observations, patient history, and epidemiological information. The expected result is Negative. Fact Sheet for Patients: SugarRoll.be Fact Sheet for Healthcare Providers: https://www.woods-mathews.com/ This test is not yet approved or cleared by the [place] FDA and  has been authorized for detection and/or diagnosis of SARS-CoV-2 by FDA under an Emergency Use Authorization (EUA). This EUA will remain  in effect (meaning this test can be used) for the duration of the COVID-19 declaration under Section 56 4(b)(1) of the Act, 21 U.S.C. section 360bbb-3(b)(1), unless the authorization is terminated or revoked sooner. Performed at [facility], [place] [address], [place],  [address]      Time spent in discharge (includes decision making & examination of pt): 35 minutes  11/27/2019, 6:42 PM   Cherene Altes, MD [facility] Office  [phone]

## 2019-11-27 NOTE — TOC Progression Note (Addendum)
Transition of Care [facility]) - Progression Note    Patient Details  Name: [name] MRN: [id number] Date of Birth: 12-19-1940  Transition of Care [facility]) CM/SW [place], [place] Phone Number: 11/27/2019, 3:55 PM  Clinical Narrative:     Update: [facility] reports they can take patient tomorrow. MD notified.   Bed offer received for patient's preference [facility], updated [facility] insurance with choice.   Insurance auth obtained with [employer] auth ID # [id number].   CSW has reached out to [facility] to see if they can accept today, pending response at this time.    Expected Discharge Plan: [place] Barriers to Discharge: Continued Medical Work up  Expected Discharge Plan and Services Expected Discharge Plan: [place]   Discharge Planning Services: CM Consult Post Acute Care Choice: [place] arrangements for the past 2 months: Single Family Home                   DME Agency: NA       HH Arranged: RN, Disease Management, PT, OT HH Agency: [facility] [employer] Date [place]: 11/24/19 Time [place]: 1559 Representative spoke with at [place]: [place] ([place]) Interventions    Readmission Risk Interventions No flowsheet data found.

## 2019-11-27 NOTE — Progress Notes (Signed)
Attempted to update patient's friend [name] about patient's pending discharge for tomorrow. Will relay to the night nurse.

## 2019-11-27 NOTE — Progress Notes (Signed)
Pt BP 200/95 after PO hydralazine given.MD notified.

## 2019-11-27 NOTE — Plan of Care (Signed)
  Problem: Education: Goal: Knowledge of General Education information will improve Description: Including pain rating scale, medication(s)/side effects and non-pharmacologic comfort measures Outcome: Progressing   Problem: Education: Goal: Ability to demonstrate management of disease process will improve Outcome: Progressing Goal: Ability to verbalize understanding of medication therapies will improve Outcome: Progressing Goal: Individualized Educational Video(s) Outcome: Progressing   Problem: Activity: Goal: Capacity to carry out activities will improve Outcome: Progressing   Problem: Cardiac: Goal: Ability to achieve and maintain adequate cardiopulmonary perfusion will improve Outcome: Progressing   Problem: Education: Goal: Knowledge of General Education information will improve Description: Including pain rating scale, medication(s)/side effects and non-pharmacologic comfort measures Outcome: Progressing   Problem: Health Behavior/Discharge Planning: Goal: Ability to manage health-related needs will improve Outcome: Progressing   Problem: Clinical Measurements: Goal: Ability to maintain clinical measurements within normal limits will improve Outcome: Progressing Goal: Will remain free from infection Outcome: Progressing Goal: Diagnostic test results will improve Outcome: Progressing Goal: Respiratory complications will improve Outcome: Progressing Goal: Cardiovascular complication will be avoided Outcome: Progressing   Problem: Activity: Goal: Risk for activity intolerance will decrease Outcome: Progressing   Problem: Nutrition: Goal: Adequate nutrition will be maintained Outcome: Progressing   Problem: Coping: Goal: Level of anxiety will decrease Outcome: Progressing   Problem: Elimination: Goal: Will not experience complications related to bowel motility Outcome: Progressing Goal: Will not experience complications related to urinary retention Outcome:  Progressing   Problem: Pain Managment: Goal: General experience of comfort will improve Outcome: Progressing   Problem: Safety: Goal: Ability to remain free from injury will improve Outcome: Progressing   Problem: Skin Integrity: Goal: Risk for impaired skin integrity will decrease Outcome: Progressing

## 2019-11-27 NOTE — Progress Notes (Signed)
Attempted to call patient's friend [name] to update her on if the patient's is being discharged today or not. There is no option for voicemail. Will try again in one hour.

## 2019-11-28 ENCOUNTER — Telehealth: Payer: Self-pay | Admitting: *Deleted

## 2019-11-28 DIAGNOSIS — R7303 Prediabetes: Secondary | ICD-10-CM | POA: Diagnosis not present

## 2019-11-28 DIAGNOSIS — Z7901 Long term (current) use of anticoagulants: Secondary | ICD-10-CM | POA: Diagnosis not present

## 2019-11-28 DIAGNOSIS — N3281 Overactive bladder: Secondary | ICD-10-CM | POA: Diagnosis not present

## 2019-11-28 DIAGNOSIS — D509 Iron deficiency anemia, unspecified: Secondary | ICD-10-CM | POA: Diagnosis not present

## 2019-11-28 DIAGNOSIS — Z79899 Other long term (current) drug therapy: Secondary | ICD-10-CM | POA: Diagnosis not present

## 2019-11-28 DIAGNOSIS — H2010 Chronic iridocyclitis, unspecified eye: Secondary | ICD-10-CM | POA: Diagnosis not present

## 2019-11-28 DIAGNOSIS — I5043 Acute on chronic combined systolic (congestive) and diastolic (congestive) heart failure: Secondary | ICD-10-CM | POA: Diagnosis not present

## 2019-11-28 DIAGNOSIS — I482 Chronic atrial fibrillation, unspecified: Secondary | ICD-10-CM | POA: Diagnosis not present

## 2019-11-28 DIAGNOSIS — I2510 Atherosclerotic heart disease of native coronary artery without angina pectoris: Secondary | ICD-10-CM | POA: Diagnosis not present

## 2019-11-28 DIAGNOSIS — K5900 Constipation, unspecified: Secondary | ICD-10-CM | POA: Diagnosis not present

## 2019-11-28 DIAGNOSIS — Z66 Do not resuscitate: Secondary | ICD-10-CM | POA: Diagnosis not present

## 2019-11-28 DIAGNOSIS — Z8249 Family history of ischemic heart disease and other diseases of the circulatory system: Secondary | ICD-10-CM | POA: Diagnosis not present

## 2019-11-28 DIAGNOSIS — I6930 Unspecified sequelae of cerebral infarction: Secondary | ICD-10-CM | POA: Diagnosis not present

## 2019-11-28 DIAGNOSIS — R739 Hyperglycemia, unspecified: Secondary | ICD-10-CM | POA: Diagnosis not present

## 2019-11-28 DIAGNOSIS — I69354 Hemiplegia and hemiparesis following cerebral infarction affecting left non-dominant side: Secondary | ICD-10-CM | POA: Diagnosis not present

## 2019-11-28 DIAGNOSIS — Z20822 Contact with and (suspected) exposure to covid-19: Secondary | ICD-10-CM | POA: Diagnosis not present

## 2019-11-28 DIAGNOSIS — E559 Vitamin D deficiency, unspecified: Secondary | ICD-10-CM | POA: Diagnosis not present

## 2019-11-28 DIAGNOSIS — I469 Cardiac arrest, cause unspecified: Secondary | ICD-10-CM | POA: Diagnosis not present

## 2019-11-28 DIAGNOSIS — N183 Chronic kidney disease, stage 3 unspecified: Secondary | ICD-10-CM | POA: Diagnosis not present

## 2019-11-28 DIAGNOSIS — R030 Elevated blood-pressure reading, without diagnosis of hypertension: Secondary | ICD-10-CM | POA: Diagnosis not present

## 2019-11-28 DIAGNOSIS — Z4682 Encounter for fitting and adjustment of non-vascular catheter: Secondary | ICD-10-CM | POA: Diagnosis not present

## 2019-11-28 DIAGNOSIS — I129 Hypertensive chronic kidney disease with stage 1 through stage 4 chronic kidney disease, or unspecified chronic kidney disease: Secondary | ICD-10-CM | POA: Diagnosis not present

## 2019-11-28 DIAGNOSIS — I480 Paroxysmal atrial fibrillation: Secondary | ICD-10-CM | POA: Diagnosis not present

## 2019-11-28 DIAGNOSIS — I4891 Unspecified atrial fibrillation: Secondary | ICD-10-CM | POA: Diagnosis not present

## 2019-11-28 DIAGNOSIS — Z515 Encounter for palliative care: Secondary | ICD-10-CM | POA: Diagnosis not present

## 2019-11-28 DIAGNOSIS — I351 Nonrheumatic aortic (valve) insufficiency: Secondary | ICD-10-CM | POA: Diagnosis not present

## 2019-11-28 DIAGNOSIS — I160 Hypertensive urgency: Secondary | ICD-10-CM | POA: Diagnosis not present

## 2019-11-28 DIAGNOSIS — I609 Nontraumatic subarachnoid hemorrhage, unspecified: Secondary | ICD-10-CM | POA: Diagnosis not present

## 2019-11-28 DIAGNOSIS — N1831 Chronic kidney disease, stage 3a: Secondary | ICD-10-CM | POA: Diagnosis not present

## 2019-11-28 DIAGNOSIS — I77810 Thoracic aortic ectasia: Secondary | ICD-10-CM | POA: Diagnosis not present

## 2019-11-28 DIAGNOSIS — R69 Illness, unspecified: Secondary | ICD-10-CM | POA: Diagnosis not present

## 2019-11-28 DIAGNOSIS — G911 Obstructive hydrocephalus: Secondary | ICD-10-CM | POA: Diagnosis not present

## 2019-11-28 DIAGNOSIS — M2550 Pain in unspecified joint: Secondary | ICD-10-CM | POA: Diagnosis not present

## 2019-11-28 DIAGNOSIS — Z03818 Encounter for observation for suspected exposure to other biological agents ruled out: Secondary | ICD-10-CM | POA: Diagnosis not present

## 2019-11-28 DIAGNOSIS — D649 Anemia, unspecified: Secondary | ICD-10-CM | POA: Diagnosis not present

## 2019-11-28 DIAGNOSIS — I252 Old myocardial infarction: Secondary | ICD-10-CM | POA: Diagnosis not present

## 2019-11-28 DIAGNOSIS — I608 Other nontraumatic subarachnoid hemorrhage: Secondary | ICD-10-CM | POA: Diagnosis not present

## 2019-11-28 DIAGNOSIS — H9193 Unspecified hearing loss, bilateral: Secondary | ICD-10-CM | POA: Diagnosis not present

## 2019-11-28 DIAGNOSIS — I161 Hypertensive emergency: Secondary | ICD-10-CM | POA: Diagnosis not present

## 2019-11-28 DIAGNOSIS — R0689 Other abnormalities of breathing: Secondary | ICD-10-CM | POA: Diagnosis not present

## 2019-11-28 DIAGNOSIS — I10 Essential (primary) hypertension: Secondary | ICD-10-CM | POA: Diagnosis not present

## 2019-11-28 DIAGNOSIS — R4020 Unspecified coma: Secondary | ICD-10-CM | POA: Diagnosis not present

## 2019-11-28 DIAGNOSIS — I5042 Chronic combined systolic (congestive) and diastolic (congestive) heart failure: Secondary | ICD-10-CM | POA: Diagnosis not present

## 2019-11-28 DIAGNOSIS — Z743 Need for continuous supervision: Secondary | ICD-10-CM | POA: Diagnosis not present

## 2019-11-28 DIAGNOSIS — I130 Hypertensive heart and chronic kidney disease with heart failure and stage 1 through stage 4 chronic kidney disease, or unspecified chronic kidney disease: Secondary | ICD-10-CM | POA: Diagnosis not present

## 2019-11-28 DIAGNOSIS — R404 Transient alteration of awareness: Secondary | ICD-10-CM | POA: Diagnosis not present

## 2019-11-28 DIAGNOSIS — I69398 Other sequelae of cerebral infarction: Secondary | ICD-10-CM | POA: Diagnosis not present

## 2019-11-28 DIAGNOSIS — I350 Nonrheumatic aortic (valve) stenosis: Secondary | ICD-10-CM | POA: Diagnosis not present

## 2019-11-28 DIAGNOSIS — R531 Weakness: Secondary | ICD-10-CM | POA: Diagnosis not present

## 2019-11-28 DIAGNOSIS — I615 Nontraumatic intracerebral hemorrhage, intraventricular: Secondary | ICD-10-CM | POA: Diagnosis not present

## 2019-11-28 DIAGNOSIS — E785 Hyperlipidemia, unspecified: Secondary | ICD-10-CM | POA: Diagnosis not present

## 2019-11-28 DIAGNOSIS — R918 Other nonspecific abnormal finding of lung field: Secondary | ICD-10-CM | POA: Diagnosis not present

## 2019-11-28 DIAGNOSIS — D508 Other iron deficiency anemias: Secondary | ICD-10-CM | POA: Diagnosis not present

## 2019-11-28 DIAGNOSIS — Z7401 Bed confinement status: Secondary | ICD-10-CM | POA: Diagnosis not present

## 2019-11-28 DIAGNOSIS — I69322 Dysarthria following cerebral infarction: Secondary | ICD-10-CM | POA: Diagnosis not present

## 2019-11-28 LAB — BASIC METABOLIC PANEL
Anion gap: 7 (ref 5–15)
BUN: 26 mg/dL — ABNORMAL HIGH (ref 8–23)
CO2: 24 mmol/L (ref 22–32)
Calcium: 8.7 mg/dL — ABNORMAL LOW (ref 8.9–10.3)
Chloride: 109 mmol/L (ref 98–111)
Creatinine, Ser: 1.33 mg/dL — ABNORMAL HIGH (ref 0.61–1.24)
GFR calc Af Amer: 59 mL/min — ABNORMAL LOW (ref >60)
GFR calc non Af Amer: 51 mL/min — ABNORMAL LOW (ref >60)
Glucose, Bld: 83 mg/dL (ref 70–99)
Potassium: 4.3 mmol/L (ref 3.5–5.1)
Sodium: 140 mmol/L (ref 135–145)

## 2019-11-28 LAB — CBC
HCT: 36.3 % — ABNORMAL LOW (ref 39.0–52.0)
Hemoglobin: 11 g/dL — ABNORMAL LOW (ref 13.0–17.0)
MCH: 21.2 pg — ABNORMAL LOW (ref 26.0–34.0)
MCHC: 30.3 g/dL (ref 30.0–36.0)
MCV: 70.1 fL — ABNORMAL LOW (ref 80.0–100.0)
Platelets: 169 10*3/uL (ref 150–400)
RBC: 5.18 MIL/uL (ref 4.22–5.81)
RDW: 16.4 % — ABNORMAL HIGH (ref 11.5–15.5)
WBC: 5.4 10*3/uL (ref 4.0–10.5)
nRBC: 0 % (ref 0.0–0.2)

## 2019-11-28 MED ORDER — METOPROLOL TARTRATE 5 MG/5ML IV SOLN
2.5000 mg | INTRAVENOUS | Status: DC | PRN
  Filled 2019-11-28: qty 5

## 2019-11-28 NOTE — Progress Notes (Signed)
Patient alert and oriented x3, denies pain, VSS. IV removed per order. Report given to [facility]  RN all questions answered patient d/c via ambulance.

## 2019-11-28 NOTE — Progress Notes (Signed)
   11/28/19 0341  Vitals  Temp 98.9 F (37.2 C)  Temp Source Oral  BP (!) 178/78  MAP (mmHg) 109  BP Location Right Arm  BP Method Automatic  Patient Position (if appropriate) Lying  Pulse Rate 64  Resp 20  Oxygen Therapy  SpO2 95 %  O2 Device Room Air  MEWS Score  MEWS Temp 0  MEWS Systolic 0  MEWS Pulse 0  MEWS RR 0  MEWS LOC 0  MEWS Score 0  MEWS Score Color Green   APP notified of increased BP.  Orders placed.

## 2019-11-28 NOTE — Telephone Encounter (Signed)
Pt was on TCM report admitted 11/21/19 for hypertensive emergency. Pt BP was  found to be as high as 239/143. Blood pressure was refractory to oral and topical treatments and therefore nitroglycerin drip was initiated. On 4/21pt had  TTE -EF 35-40% with grade 2 DD -mild right systolic failure -biatrial severe dilation -moderate aortic stenosis with a mean gradient of 8 mmHg and a valve area of 1.49 cm -mild aortic valve regurgitation. Pt D/C 11/27/19 to SNF.Marland KitchenJohny Chess

## 2019-11-28 NOTE — Progress Notes (Signed)
No acute interval changes, no new complaints, vital signs are stable as listed below. He desires to go to a skilled nursing facility today.  Stable to discharge.  Please refer to discharge summary done by Dr.McClung on April 26 for details.  Blood pressure (!) 122/44, pulse 79, temperature 98.2 F (36.8 C), temperature source Oral, resp. rate 18, height 5\' 6"  (1.676 m), weight 73.1 kg, SpO2 94 %.

## 2019-11-28 NOTE — TOC Transition Note (Signed)
Transition of Care [facility]) - CM/SW Discharge Note   Patient Details  Name: [name] MRN: [id number] Date of Birth: 01/05/1941  Transition of Care [facility]) CM/SW Contact:  Alberteen Sam, LCSW Phone Number: 11/28/2019, 9:29 AM   Clinical Narrative:     Patient will DC to: [facility] Anticipated DC date: 11/28/19 Family notified:Connie [employer] [phone]  Per MD patient ready for DC to [facility] . RN, patient, patient's family, and facility notified of DC. Discharge Summary sent to facility. RN given number for report  [phone]. DC packet on chart. Ambulance transport requested for patient.  CSW signing off.  [place], [place]   Final next level of care: Skilled Nursing Facility Barriers to Discharge: No Barriers Identified   Patient Goals and CMS Choice Patient states their goals for this hospitalization and ongoing recovery are:: get better CMS Medicare.gov Compare Post Acute Care list provided to:: Patient Choice offered to / list presented to : Patient  Discharge Placement PASRR number recieved: 11/25/19            Patient chooses bed at: [place] Patient to be transferred to facility by: [place] Name of family member notified: [name] Patient and family notified of of transfer: 11/28/19  Discharge Plan and Services   Discharge Planning Services: CM Consult Post Acute Care Choice: Home Health            DME Agency: NA       HH Arranged: RN, Disease Management, PT, OT [place] Agency: [facility] ([facility] [employer] Date [place]: 11/24/19 Time [place]: 1559 Representative spoke with at [place]: [place] ([place]) Interventions     Readmission Risk Interventions No flowsheet data found.

## 2019-11-30 DIAGNOSIS — D508 Other iron deficiency anemias: Secondary | ICD-10-CM | POA: Diagnosis not present

## 2019-11-30 DIAGNOSIS — I480 Paroxysmal atrial fibrillation: Secondary | ICD-10-CM | POA: Diagnosis not present

## 2019-11-30 DIAGNOSIS — R030 Elevated blood-pressure reading, without diagnosis of hypertension: Secondary | ICD-10-CM | POA: Diagnosis not present

## 2019-11-30 DIAGNOSIS — N1831 Chronic kidney disease, stage 3a: Secondary | ICD-10-CM | POA: Diagnosis not present

## 2019-11-30 DIAGNOSIS — I129 Hypertensive chronic kidney disease with stage 1 through stage 4 chronic kidney disease, or unspecified chronic kidney disease: Secondary | ICD-10-CM | POA: Diagnosis not present

## 2019-12-04 ENCOUNTER — Telehealth (HOSPITAL_COMMUNITY): Payer: Self-pay

## 2019-12-04 NOTE — Telephone Encounter (Signed)
[name] called to cancel pt's consult with Dr. Estanislado Pandy. Pt is still in the hospital and getting ready to be discharged to a nursing home. She will call back to reschedule. AW

## 2019-12-06 ENCOUNTER — Other Ambulatory Visit: Payer: Self-pay

## 2019-12-06 ENCOUNTER — Emergency Department (HOSPITAL_COMMUNITY): Payer: Medicare Other

## 2019-12-06 ENCOUNTER — Inpatient Hospital Stay (HOSPITAL_COMMUNITY)
Admission: EM | Admit: 2019-12-06 | Discharge: 2020-01-02 | DRG: 065 | Disposition: E | Payer: Medicare Other | Source: Skilled Nursing Facility | Attending: Internal Medicine | Admitting: Internal Medicine

## 2019-12-06 DIAGNOSIS — G911 Obstructive hydrocephalus: Secondary | ICD-10-CM | POA: Diagnosis present

## 2019-12-06 DIAGNOSIS — N1831 Chronic kidney disease, stage 3a: Secondary | ICD-10-CM | POA: Diagnosis not present

## 2019-12-06 DIAGNOSIS — Z20822 Contact with and (suspected) exposure to covid-19: Secondary | ICD-10-CM | POA: Diagnosis not present

## 2019-12-06 DIAGNOSIS — Z4682 Encounter for fitting and adjustment of non-vascular catheter: Secondary | ICD-10-CM | POA: Diagnosis not present

## 2019-12-06 DIAGNOSIS — Z7901 Long term (current) use of anticoagulants: Secondary | ICD-10-CM | POA: Diagnosis not present

## 2019-12-06 DIAGNOSIS — I609 Nontraumatic subarachnoid hemorrhage, unspecified: Principal | ICD-10-CM

## 2019-12-06 DIAGNOSIS — I480 Paroxysmal atrial fibrillation: Secondary | ICD-10-CM | POA: Diagnosis not present

## 2019-12-06 DIAGNOSIS — E785 Hyperlipidemia, unspecified: Secondary | ICD-10-CM | POA: Diagnosis present

## 2019-12-06 DIAGNOSIS — R404 Transient alteration of awareness: Secondary | ICD-10-CM | POA: Diagnosis not present

## 2019-12-06 DIAGNOSIS — I5042 Chronic combined systolic (congestive) and diastolic (congestive) heart failure: Secondary | ICD-10-CM | POA: Diagnosis present

## 2019-12-06 DIAGNOSIS — I6930 Unspecified sequelae of cerebral infarction: Secondary | ICD-10-CM | POA: Diagnosis not present

## 2019-12-06 DIAGNOSIS — I130 Hypertensive heart and chronic kidney disease with heart failure and stage 1 through stage 4 chronic kidney disease, or unspecified chronic kidney disease: Secondary | ICD-10-CM | POA: Diagnosis not present

## 2019-12-06 DIAGNOSIS — I4891 Unspecified atrial fibrillation: Secondary | ICD-10-CM | POA: Diagnosis not present

## 2019-12-06 DIAGNOSIS — I608 Other nontraumatic subarachnoid hemorrhage: Principal | ICD-10-CM | POA: Diagnosis present

## 2019-12-06 DIAGNOSIS — I161 Hypertensive emergency: Secondary | ICD-10-CM | POA: Diagnosis not present

## 2019-12-06 DIAGNOSIS — R531 Weakness: Secondary | ICD-10-CM | POA: Diagnosis not present

## 2019-12-06 DIAGNOSIS — Z8249 Family history of ischemic heart disease and other diseases of the circulatory system: Secondary | ICD-10-CM | POA: Diagnosis not present

## 2019-12-06 DIAGNOSIS — I69354 Hemiplegia and hemiparesis following cerebral infarction affecting left non-dominant side: Secondary | ICD-10-CM

## 2019-12-06 DIAGNOSIS — I69322 Dysarthria following cerebral infarction: Secondary | ICD-10-CM

## 2019-12-06 DIAGNOSIS — I615 Nontraumatic intracerebral hemorrhage, intraventricular: Secondary | ICD-10-CM | POA: Diagnosis present

## 2019-12-06 DIAGNOSIS — Z66 Do not resuscitate: Secondary | ICD-10-CM | POA: Diagnosis not present

## 2019-12-06 DIAGNOSIS — R918 Other nonspecific abnormal finding of lung field: Secondary | ICD-10-CM | POA: Diagnosis not present

## 2019-12-06 DIAGNOSIS — D509 Iron deficiency anemia, unspecified: Secondary | ICD-10-CM | POA: Diagnosis present

## 2019-12-06 DIAGNOSIS — I77810 Thoracic aortic ectasia: Secondary | ICD-10-CM | POA: Diagnosis not present

## 2019-12-06 DIAGNOSIS — Z515 Encounter for palliative care: Secondary | ICD-10-CM

## 2019-12-06 DIAGNOSIS — I69398 Other sequelae of cerebral infarction: Secondary | ICD-10-CM

## 2019-12-06 DIAGNOSIS — I252 Old myocardial infarction: Secondary | ICD-10-CM

## 2019-12-06 DIAGNOSIS — I10 Essential (primary) hypertension: Secondary | ICD-10-CM | POA: Diagnosis not present

## 2019-12-06 DIAGNOSIS — I2510 Atherosclerotic heart disease of native coronary artery without angina pectoris: Secondary | ICD-10-CM | POA: Diagnosis present

## 2019-12-06 DIAGNOSIS — R4020 Unspecified coma: Secondary | ICD-10-CM | POA: Diagnosis not present

## 2019-12-06 DIAGNOSIS — Z79899 Other long term (current) drug therapy: Secondary | ICD-10-CM | POA: Diagnosis not present

## 2019-12-06 DIAGNOSIS — R0689 Other abnormalities of breathing: Secondary | ICD-10-CM | POA: Diagnosis not present

## 2019-12-06 DIAGNOSIS — Z743 Need for continuous supervision: Secondary | ICD-10-CM | POA: Diagnosis not present

## 2019-12-06 DIAGNOSIS — I482 Chronic atrial fibrillation, unspecified: Secondary | ICD-10-CM | POA: Diagnosis not present

## 2019-12-06 DIAGNOSIS — Z03818 Encounter for observation for suspected exposure to other biological agents ruled out: Secondary | ICD-10-CM | POA: Diagnosis not present

## 2019-12-06 LAB — CBC
HCT: 44.1 % (ref 39.0–52.0)
Hemoglobin: 13.2 g/dL (ref 13.0–17.0)
MCH: 21.7 pg — ABNORMAL LOW (ref 26.0–34.0)
MCHC: 29.9 g/dL — ABNORMAL LOW (ref 30.0–36.0)
MCV: 72.5 fL — ABNORMAL LOW (ref 80.0–100.0)
Platelets: 192 10*3/uL (ref 150–400)
RBC: 6.08 MIL/uL — ABNORMAL HIGH (ref 4.22–5.81)
RDW: 18.4 % — ABNORMAL HIGH (ref 11.5–15.5)
WBC: 6.7 10*3/uL (ref 4.0–10.5)
nRBC: 0 % (ref 0.0–0.2)

## 2019-12-06 LAB — POCT I-STAT 7, (LYTES, BLD GAS, ICA,H+H)
Acid-Base Excess: 1 mmol/L (ref 0.0–2.0)
Bicarbonate: 24.8 mmol/L (ref 20.0–28.0)
Calcium, Ion: 1.24 mmol/L (ref 1.15–1.40)
HCT: 39 % (ref 39.0–52.0)
Hemoglobin: 13.3 g/dL (ref 13.0–17.0)
O2 Saturation: 100 %
Patient temperature: 97.5
Potassium: 3.5 mmol/L (ref 3.5–5.1)
Sodium: 142 mmol/L (ref 135–145)
TCO2: 26 mmol/L (ref 22–32)
pCO2 arterial: 33.9 mmHg (ref 32.0–48.0)
pH, Arterial: 7.469 — ABNORMAL HIGH (ref 7.350–7.450)
pO2, Arterial: 349 mmHg — ABNORMAL HIGH (ref 83.0–108.0)

## 2019-12-06 LAB — DIFFERENTIAL
Abs Immature Granulocytes: 0.03 10*3/uL (ref 0.00–0.07)
Basophils Absolute: 0 10*3/uL (ref 0.0–0.1)
Basophils Relative: 0 %
Eosinophils Absolute: 0.1 10*3/uL (ref 0.0–0.5)
Eosinophils Relative: 2 %
Immature Granulocytes: 0 %
Lymphocytes Relative: 32 %
Lymphs Abs: 2.2 10*3/uL (ref 0.7–4.0)
Monocytes Absolute: 0.4 10*3/uL (ref 0.1–1.0)
Monocytes Relative: 6 %
Neutro Abs: 4 10*3/uL (ref 1.7–7.7)
Neutrophils Relative %: 60 %

## 2019-12-06 LAB — COMPREHENSIVE METABOLIC PANEL
ALT: 19 U/L (ref 0–44)
AST: 29 U/L (ref 15–41)
Albumin: 3.3 g/dL — ABNORMAL LOW (ref 3.5–5.0)
Alkaline Phosphatase: 118 U/L (ref 38–126)
Anion gap: 13 (ref 5–15)
BUN: 22 mg/dL (ref 8–23)
CO2: 20 mmol/L — ABNORMAL LOW (ref 22–32)
Calcium: 9 mg/dL (ref 8.9–10.3)
Chloride: 106 mmol/L (ref 98–111)
Creatinine, Ser: 1.3 mg/dL — ABNORMAL HIGH (ref 0.61–1.24)
GFR calc Af Amer: >60 mL/min (ref >60)
GFR calc non Af Amer: 53 mL/min — ABNORMAL LOW (ref >60)
Glucose, Bld: 161 mg/dL — ABNORMAL HIGH (ref 70–99)
Potassium: 3.7 mmol/L (ref 3.5–5.1)
Sodium: 139 mmol/L (ref 135–145)
Total Bilirubin: 0.5 mg/dL (ref 0.3–1.2)
Total Protein: 7.7 g/dL (ref 6.5–8.1)

## 2019-12-06 LAB — I-STAT CHEM 8, ED
BUN: 25 mg/dL — ABNORMAL HIGH (ref 8–23)
Calcium, Ion: 1.16 mmol/L (ref 1.15–1.40)
Chloride: 107 mmol/L (ref 98–111)
Creatinine, Ser: 1.2 mg/dL (ref 0.61–1.24)
Glucose, Bld: 154 mg/dL — ABNORMAL HIGH (ref 70–99)
HCT: 41 % (ref 39.0–52.0)
Hemoglobin: 13.9 g/dL (ref 13.0–17.0)
Potassium: 3.4 mmol/L — ABNORMAL LOW (ref 3.5–5.1)
Sodium: 141 mmol/L (ref 135–145)
TCO2: 24 mmol/L (ref 22–32)

## 2019-12-06 LAB — PROTIME-INR
INR: 1.1 (ref 0.8–1.2)
Prothrombin Time: 14.2 seconds (ref 11.4–15.2)

## 2019-12-06 LAB — CBG MONITORING, ED: Glucose-Capillary: 151 mg/dL — ABNORMAL HIGH (ref 70–99)

## 2019-12-06 LAB — CDS SEROLOGY

## 2019-12-06 LAB — RESPIRATORY PANEL BY RT PCR (FLU A&B, COVID)
Influenza A by PCR: NEGATIVE
Influenza B by PCR: NEGATIVE
SARS Coronavirus 2 by RT PCR: NEGATIVE

## 2019-12-06 LAB — APTT: aPTT: 27 seconds (ref 24–36)

## 2019-12-06 LAB — TRIGLYCERIDES: Triglycerides: 78 mg/dL (ref <150)

## 2019-12-06 MED ORDER — ACETAMINOPHEN 650 MG RE SUPP: 650.0000 mg | Freq: Four times a day (QID) | RECTAL | Status: DC | PRN

## 2019-12-06 MED ORDER — GLYCOPYRROLATE 0.2 MG/ML IJ SOLN
0.2000 mg | INTRAMUSCULAR | Status: DC | PRN
  Administered 2019-12-06: 0.2 mg via INTRAVENOUS
  Filled 2019-12-06: qty 1

## 2019-12-06 MED ORDER — POLYVINYL ALCOHOL 1.4 % OP SOLN
1.0000 [drp] | Freq: Four times a day (QID) | OPHTHALMIC | Status: DC | PRN
  Filled 2019-12-06: qty 15

## 2019-12-06 MED ORDER — FENTANYL CITRATE (PF) 100 MCG/2ML IJ SOLN: 100.0000 ug | INTRAMUSCULAR | Status: DC | PRN

## 2019-12-06 MED ORDER — GLYCOPYRROLATE 0.2 MG/ML IJ SOLN: 0.2000 mg | INTRAMUSCULAR | Status: DC | PRN

## 2019-12-06 MED ORDER — ACETAMINOPHEN 325 MG PO TABS: 650.0000 mg | ORAL_TABLET | Freq: Four times a day (QID) | ORAL | Status: DC | PRN

## 2019-12-06 MED ORDER — MORPHINE SULFATE (PF) 4 MG/ML IV SOLN
4.0000 mg | INTRAVENOUS | Status: DC | PRN
  Administered 2019-12-06: 4 mg via INTRAVENOUS
  Filled 2019-12-06: qty 1

## 2019-12-06 MED ORDER — BIOTENE DRY MOUTH MT LIQD: 15.0000 mL | OROMUCOSAL | Status: DC | PRN

## 2019-12-06 MED ORDER — ETOMIDATE 2 MG/ML IV SOLN
INTRAVENOUS | Status: AC | PRN
  Administered 2019-12-06: 25 mg via INTRAVENOUS

## 2019-12-06 MED ORDER — CLEVIDIPINE BUTYRATE 0.5 MG/ML IV EMUL
0.0000 mg/h | INTRAVENOUS | Status: DC
  Administered 2019-12-06: 1 mg/h via INTRAVENOUS
  Filled 2019-12-06 (×2): qty 50

## 2019-12-06 MED ORDER — PROPOFOL 1000 MG/100ML IV EMUL: 0.0000 ug/kg/min | INTRAVENOUS | Status: DC

## 2019-12-06 MED ORDER — MORPHINE 100MG IN NS 100ML (1MG/ML) PREMIX INFUSION
1.0000 mg/h | INTRAVENOUS | Status: DC
  Administered 2019-12-06: 1 mg/h via INTRAVENOUS
  Filled 2019-12-06: qty 100

## 2019-12-06 MED ORDER — ROCURONIUM BROMIDE 50 MG/5ML IV SOLN
INTRAVENOUS | Status: AC | PRN
  Administered 2019-12-06: 75 mg via INTRAVENOUS

## 2019-12-06 MED ORDER — LORAZEPAM 1 MG PO TABS: 1.0000 mg | ORAL_TABLET | ORAL | Status: DC | PRN

## 2019-12-06 MED ORDER — DIPHENHYDRAMINE HCL 50 MG/ML IJ SOLN: 12.5000 mg | INTRAMUSCULAR | Status: DC | PRN

## 2019-12-06 MED ORDER — EMPTY CONTAINERS FLEXIBLE MISC
900.0000 mg | Freq: Once | Status: AC
  Administered 2019-12-06: 900 mg via INTRAVENOUS
  Filled 2019-12-06: qty 90

## 2019-12-06 MED ORDER — HALOPERIDOL LACTATE 5 MG/ML IJ SOLN: 0.5000 mg | INTRAMUSCULAR | Status: DC | PRN

## 2019-12-06 MED ORDER — GLYCOPYRROLATE 1 MG PO TABS
1.0000 mg | ORAL_TABLET | ORAL | Status: DC | PRN
  Filled 2019-12-06: qty 1

## 2019-12-06 MED ORDER — HALOPERIDOL 0.5 MG PO TABS: 0.5000 mg | ORAL_TABLET | ORAL | Status: DC | PRN

## 2019-12-06 MED ORDER — LORAZEPAM 2 MG/ML IJ SOLN
2.0000 mg | Freq: Once | INTRAMUSCULAR | Status: AC
  Administered 2019-12-06: 2 mg via INTRAVENOUS
  Filled 2019-12-06: qty 1

## 2019-12-06 MED ORDER — ONDANSETRON HCL 4 MG/2ML IJ SOLN: 4.0000 mg | Freq: Four times a day (QID) | INTRAMUSCULAR | Status: DC | PRN

## 2019-12-06 MED ORDER — GLYCOPYRROLATE 0.2 MG/ML IJ SOLN: 0.1000 mg | INTRAMUSCULAR | Status: DC | PRN

## 2019-12-06 MED ORDER — ONDANSETRON 4 MG PO TBDP: 4.0000 mg | ORAL_TABLET | Freq: Four times a day (QID) | ORAL | Status: DC | PRN

## 2019-12-06 MED ORDER — MORPHINE BOLUS VIA INFUSION
2.0000 mg | INTRAVENOUS | Status: DC | PRN
  Filled 2019-12-06: qty 2

## 2019-12-06 MED ORDER — LORAZEPAM 2 MG/ML PO CONC
1.0000 mg | ORAL | Status: DC | PRN
  Filled 2019-12-06: qty 0.5

## 2019-12-06 MED ORDER — HALOPERIDOL LACTATE 2 MG/ML PO CONC
0.5000 mg | ORAL | Status: DC | PRN
  Filled 2019-12-06: qty 0.3

## 2019-12-06 MED ORDER — LORAZEPAM 2 MG/ML IJ SOLN: 1.0000 mg | INTRAMUSCULAR | Status: DC | PRN

## 2019-12-06 NOTE — Progress Notes (Signed)
Pharmacy: [facility] This patient met criteria for:  [ X ] Peripheral administration ICD-10-PCS Code: [id number]   '[x]'  Dose ordered / given was 900 mg (9x100 mg vials)  '[]'  Dose ordered / given was 1800 mg (18 x 100 mg vials)   Bertis Ruddy, PharmD Clinical Pharmacist ED Pharmacist Phone # [phone] 12/21/2019 8:01 AM

## 2019-12-06 NOTE — H&P (Signed)
History and Physical    [name] KGM:[id number] DOB: Mar 02, 1941 DOA: 12/29/2019  Referring MD/NP/PA: Leonette Monarch, MD PCP: Biagio Borg, MD  Patient coming from: [facility] via EMS  Chief Complaint: Unresponsive  I have personally briefly reviewed patient's old medical records in [place]   HPI: [name] is a 79 y.o. 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  History is obtained from review of records.  Patient had just recently been hospitalized from 4/20-4/26 for hypertensive urgency with acute on chronic combined systolic and diastolic CHF.  Eliquis was restarted during that hospitalization.  Ultimately he was discharged to [facility] facility.  Patient was last noted to be normal sometime yesterday.  En route with EMS patient was noted to have pinpoint pupils and agonal breathing.  ED Course: Upon admission to the emergency department patient was noted to be unresponsive to painful stimuli with no gag or corneal reflex appreciated.  He was intubated initially for airway protection.  CT imaging of the brain revealed extensive subarachnoid hemorrhage with hydrocephalus secondary to possible aneurysm.  Patient was started on anticoagulation reversal at that time.  Neurosurgery had been consulted, but based on imaging did not feel surgical intervention would change patient outcome.  Family was able to be contacted and wanted to proceed with comfort care, but before extubating wanted the patient's pastor to be present.  After patient's pastor had arrived extubation was performed.  TRH called to admit.  Review of Systems  Unable to perform ROS: Patient unresponsive    Past Medical History:  Diagnosis Date  . Anemia, iron deficiency 08/15/2011  . CAD (coronary artery disease)    cath in  1/08: EF 60%, mild dilated Ao root, oD2 30%, LAD 20-30%.  . Dilated aortic root ([place]) 08/15/2011  . Dyslipidemia   . Dysrhythmia   . History of MI (myocardial infarction) 08/15/2011  . History of pneumonia 08/15/2011  . History of stroke   . Hyperlipidemia   . Hypertension    echocardiogram 6/10: Moderate LVH, EF 55-65%, mild, mild MR, MAC, mild LAE, PASP 32  . Myocardial infarction ([place])   . Paroxysmal atrial fibrillation (H[facility])   . Prediabetes 10/08/2015  . Preoperative cardiovascular examination 10/09/2015  . Shortness of breath dyspnea   . Stroke ([place]) 10/2011  . Systolic congestive heart failure ([place]) 01/13/2017    Past Surgical History:  Procedure Laterality Date  . CARDIAC CATHETERIZATION    . COLONOSCOPY    . ENDARTERECTOMY Right 10/10/2015   Procedure: ENDARTERECTOMY CAROTID-RIGHT;  Surgeon: Serafina Mitchell, MD;  Location: [facility] OR;  Service: Vascular;  Laterality: Right;  . IR RADIOLOGIST EVAL & MGMT  02/11/2017  . IR RADIOLOGIST EVAL & MGMT  03/24/2017     reports that he has never smoked. He has never used smokeless tobacco. He reports that he does not drink alcohol or use drugs.  No Known Allergies  Family History  Problem Relation Age of Onset  . Aneurysm Sister   . Colon cancer Neg Hx     Prior to Admission medications   Medication Sig Start Date End Date Taking? Authorizing Provider  acetaminophen (TYLENOL) 325 MG tablet Take 2 tablets (650 mg total) by mouth every 4 (four) hours as needed for mild pain, moderate pain or headache. 11/27/19  Yes Cherene Altes,  MD  apixaban (ELIQUIS) 5 MG TABS tablet Take 1 tablet (5 mg total) by mouth 2 (two) times daily for 30 days. 08/24/18 12/05/28 Yes Biagio Borg, MD  atorvastatin (LIPITOR) 80 MG tablet Take 1 tablet by mouth once daily Patient taking differently: Take 80 mg by mouth daily.  08/28/19  Yes Biagio Borg, MD  carvedilol (COREG) 12.5 MG tablet Take 1 tablet (12.5 mg total) by mouth 2 (two) times daily. 11/27/19  Yes  Cherene Altes, MD  ferrous sulfate 325 (65 FE) MG tablet Take 1 tablet (325 mg total) by mouth 2 (two) times daily with a meal for 30 days. 08/24/18 12/05/28 Yes Biagio Borg, MD  hydrALAZINE (APRESOLINE) 50 MG tablet Take 1 tablet (50 mg total) by mouth every 8 (eight) hours. 11/27/19  Yes Cherene Altes, MD  isosorbide dinitrate (ISORDIL) 20 MG tablet Take 1 tablet (20 mg total) by mouth 3 (three) times daily. 11/27/19  Yes Cherene Altes, MD  lisinopril (ZESTRIL) 5 MG tablet Take 1 tablet (5 mg total) by mouth daily. 08/28/19  Yes Biagio Borg, MD  losartan-hydrochlorothiazide (HYZAAR) 100-12.5 MG tablet Take 1 tablet by mouth daily.   Yes [provider]  oxybutynin (DITROPAN-XL) 5 MG 24 hr tablet Take 1 tablet (5 mg total) by mouth at bedtime. 08/24/18  Yes Biagio Borg, MD  polyethylene glycol (MIRALAX / GLYCOLAX) 17 g packet Take 17 g by mouth daily.   Yes [provider]  tamsulosin (FLOMAX) 0.4 MG CAPS capsule Take 1 capsule (0.4 mg total) by mouth daily after supper. 08/28/19  Yes Biagio Borg, MD    Physical Exam:  Constitutional: Elderly male not responsive to noxious stimuli Vitals:   12/10/2019 0825 12/28/2019 0826 12/20/2019 0828 12/23/2019 0829  BP: 131/62 135/71 133/70 139/68  Pulse:      Resp: (!) 22 (!) 22 (!) 22 (!) 22  Temp: (!) 96.9 F (36.1 C) (!) 96.9 F (36.1 C) (!) 96.9 F (36.1 C) (!) 96.9 F (36.1 C)  SpO2:      Weight:      Height:       Eyes: Pinpoint pupils nonreactive ENMT: Mucous membranes are dry Neck: normal, supple, no masses, no thyromegaly Respiratory: Agonal respirations with O2 saturations maintained on room air.  No significant wheezes or rhonchi appreciated. Cardiovascular: Regular rate and rhythm, no murmurs / rubs / gallops. No extremity edema. 2+ pedal pulses. No carotid bruits.  Abdomen: no tenderness, no masses palpated. No hepatosplenomegaly. Bowel sounds positive.  Musculoskeletal: no clubbing / cyanosis. No joint  deformity upper and lower extremities. Good ROM, no contractures. Normal muscle tone.  Skin: no rashes, lesions, ulcers. No induration Neurologic: Poor gag reflex is appreciated. Psychiatric: Unresponsive.    Labs on Admission: I have personally reviewed following labs and imaging studies  CBC: Recent Labs  Lab 12/22/2019 0610 12/21/2019 0630 12/26/2019 0708  WBC 6.7  --   --   NEUTROABS 4.0  --   --   HGB 13.2 13.9 13.3  HCT 44.1 41.0 39.0  MCV 72.5*  --   --   PLT 192  --   --    Basic Metabolic Panel: Recent Labs  Lab 12/13/2019 0610 12/12/2019 0630 12/13/2019 0708  NA 139 141 142  K 3.7 3.4* 3.5  CL 106 107  --   CO2 20*  --   --   GLUCOSE 161* 154*  --   BUN 22 25*  --  CREATININE 1.30* 1.20  --   CALCIUM 9.0  --   --    GFR: Estimated Creatinine Clearance: 48.2 mL/min (by C-G formula based on SCr of 1.2 mg/dL). Liver Function Tests: Recent Labs  Lab 12/27/2019 0610  AST 29  ALT 19  ALKPHOS 118  BILITOT 0.5  PROT 7.7  ALBUMIN 3.3*   No results for input(s): LIPASE, AMYLASE in the last 168 hours. No results for input(s): AMMONIA in the last 168 hours. Coagulation Profile: Recent Labs  Lab 12/24/2019 0610  INR 1.1   Cardiac Enzymes: No results for input(s): CKTOTAL, CKMB, CKMBINDEX, TROPONINI in the last 168 hours. BNP (last 3 results) No results for input(s): PROBNP in the last 8760 hours. HbA1C: No results for input(s): HGBA1C in the last 72 hours. CBG: Recent Labs  Lab 12/18/2019 0615  GLUCAP 151*   Lipid Profile: Recent Labs    12/17/2019 0610  TRIG 78   Thyroid Function Tests: No results for input(s): TSH, T4TOTAL, FREET4, T3FREE, THYROIDAB in the last 72 hours. Anemia Panel: No results for input(s): VITAMINB12, FOLATE, FERRITIN, TIBC, IRON, RETICCTPCT in the last 72 hours. Urine analysis:    Component Value Date/Time   COLORURINE YELLOW 07/14/2018 1629   APPEARANCEUR Cloudy (A) 07/14/2018 1629   LABSPEC 1.020 07/14/2018 1629   PHURINE 5.5  07/14/2018 1629   GLUCOSEU NEGATIVE 07/14/2018 1629   HGBUR MODERATE (A) 07/14/2018 1629   BILIRUBINUR NEGATIVE 07/14/2018 1629   KETONESUR NEGATIVE 07/14/2018 1629   PROTEINUR 100 (A) 04/03/2017 2108   UROBILINOGEN 0.2 07/14/2018 1629   NITRITE POSITIVE (A) 07/14/2018 1629   LEUKOCYTESUR SMALL (A) 07/14/2018 1629   Sepsis Labs: Recent Results (from the past 240 hour(s))  SARS CORONAVIRUS 2 (TAT 6-24 HRS) Nasopharyngeal Nasopharyngeal Swab     Status: None   Collection Time: 11/27/19 11:41 AM   Specimen: Nasopharyngeal Swab  Result Value Ref Range Status   SARS Coronavirus 2 NEGATIVE NEGATIVE Final    Comment: (NOTE) SARS-CoV-2 target nucleic acids are NOT DETECTED. The SARS-CoV-2 RNA is generally detectable in upper and lower respiratory specimens during the acute phase of infection. Negative results do not preclude SARS-CoV-2 infection, do not rule out co-infections with other pathogens, and should not be used as the sole basis for treatment or other patient management decisions. Negative results must be combined with clinical observations, patient history, and epidemiological information. The expected result is Negative. Fact Sheet for Patients: SugarRoll.be Fact Sheet for Healthcare Providers: https://www.woods-mathews.com/ This test is not yet approved or cleared by the [place] FDA and  has been authorized for detection and/or diagnosis of SARS-CoV-2 by FDA under an Emergency Use Authorization (EUA). This EUA will remain  in effect (meaning this test can be used) for the duration of the COVID-19 declaration under Section 56 4(b)(1) of the Act, 21 U.S.C. section 360bbb-3(b)(1), unless the authorization is terminated or revoked sooner. Performed at [facility], [place] [address], [place], [place] [address]   Respiratory Panel by RT PCR (Flu A&B, Covid) - Nasopharyngeal Swab     Status: None   Collection Time: 12/30/2019   6:20 AM   Specimen: Nasopharyngeal Swab  Result Value Ref Range Status   SARS Coronavirus 2 by RT PCR NEGATIVE NEGATIVE Final    Comment: (NOTE) SARS-CoV-2 target nucleic acids are NOT DETECTED. The SARS-CoV-2 RNA is generally detectable in upper respiratoy specimens during the acute phase of infection. The lowest concentration of SARS-CoV-2 viral copies this assay can detect is 131 copies/mL.  A negative result does not preclude SARS-Cov-2 infection and should not be used as the sole basis for treatment or other patient management decisions. A negative result may occur with  improper specimen collection/handling, submission of specimen other than nasopharyngeal swab, presence of viral mutation(s) within the areas targeted by this assay, and inadequate number of viral copies (<131 copies/mL). A negative result must be combined with clinical observations, patient history, and epidemiological information. The expected result is Negative. Fact Sheet for Patients:  PinkCheek.be Fact Sheet for Healthcare Providers:  GravelBags.it This test is not yet ap proved or cleared by the [place] FDA and  has been authorized for detection and/or diagnosis of SARS-CoV-2 by FDA under an Emergency Use Authorization (EUA). This EUA will remain  in effect (meaning this test can be used) for the duration of the COVID-19 declaration under Section 564(b)(1) of the Act, 21 U.S.C. section 360bbb-3(b)(1), unless the authorization is terminated or revoked sooner.    Influenza A by PCR NEGATIVE NEGATIVE Final   Influenza B by PCR NEGATIVE NEGATIVE Final    Comment: (NOTE) The Xpert Xpress SARS-CoV-2/FLU/RSV assay is intended as an aid in  the diagnosis of influenza from Nasopharyngeal swab specimens and  should not be used as a sole basis for treatment. Nasal washings and  aspirates are unacceptable for Xpert Xpress SARS-CoV-2/FLU/RSV   testing. Fact Sheet for Patients: PinkCheek.be Fact Sheet for Healthcare Providers: GravelBags.it This test is not yet approved or cleared by the [place] FDA and  has been authorized for detection and/or diagnosis of SARS-CoV-2 by  FDA under an Emergency Use Authorization (EUA). This EUA will remain  in effect (meaning this test can be used) for the duration of the  Covid-19 declaration under Section 564(b)(1) of the Act, 21  U.S.C. section 360bbb-3(b)(1), unless the authorization is  terminated or revoked. Performed at [facility], [place] [address] [place], [place] [address]      Radiological Exams on Admission: CT HEAD WO CONTRAST  Result Date: 12/11/2019 CLINICAL DATA:  Air distended air trapping elevated crit Dom a EXAM: CT HEAD WITHOUT CONTRAST TECHNIQUE: Contiguous axial images were obtained from the base of the skull through the vertex without intravenous contrast. COMPARISON:  CT 10/11/2015, MRA 11/17/2019, MRI 10/11/2015 FINDINGS: Brain: There is extensive high volume subarachnoid hemorrhage throughout the basilar cisterns with some irregular hypodensity seen about the basilar tip. Additional hyperdense hemorrhage appears to track along the left MCA and into the sulci of the left frontal lobe. There is a large volume of intraventricular hemorrhage as well with more layering hemorrhage posteriorly. The significant interval increase in the ventricular caliber from comparison MR angiogram 11/17/2019 as well as periventricular hypoattenuation is compatible with acute obstructive hydrocephalus. Findings are on a background of advanced chronic white matter changes and diffuse parenchymal volume loss seen on comparison studies. No convincing CT evidence of acute large territory ischemic infarction Vascular: Hyperdensity along the left MCA distribution, favor subarachnoid blood. Irregular hypoattenuation at the level of the  basilar tip, does raise some suspicion for a sentinel clot sign. Skull: No calvarial fracture or suspicious osseous lesion. No scalp swelling or hematoma. Sinuses/Orbits: Mild thickening of the ethmoid sinuses. Fluid level in the left maxillary sinus with more inspissated debris, nodular mural thickening and sclerotic changes of the sinus perimeter suggesting chronicity. Mastoid air cells are well aerated. Included orbital structures are unremarkable. Other: Extensive severe periodontal disease. Patient is intubated at the time of exam. IMPRESSION: 1. Extensive high volume  subarachnoid hemorrhage throughout the basilar cisterns with some irregular hypodensity seen about the basilar tip. Additional hyperdense hemorrhage appears to track along the left MCA and into the sulci of the bilateral frontal lobes and temporal lobes as well as the cerebellum. 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 Hypodensity along the basilar tip is could suggest a possible sentinel clot sign. 2. There is a large volume of intraventricular hemorrhage as well with more layering hemorrhage posteriorly. The significant interval increase in the ventricular caliber as well as periventricular hypoattenuation is compatible with acute obstructive hydrocephalus. 3. Background of extensive chronic white matter changes and diffuse parenchymal volume loss. Critical Value/emergent results were called by telephone at the time of discovery on 12/24/2019 at 7:00 am to provider [facility] , who verbally acknowledged these results. Electronically Signed   By: Lovena Le M.D.   On: 12/30/2019 07:13   DG Chest Portable 1 View  Result Date: 12/03/2019 CLINICAL DATA:  Tube placement EXAM: PORTABLE CHEST 1 VIEW COMPARISON:  Radiograph 12/31/2016, CT 01/01/2017 FINDINGS: *Endotracheal tube in the mid trachea, 5 cm from the carina. *Transesophageal tube tip in the left upper quadrant with the side port approximating the GE junction. Could be advanced  2-3 cm for optimal function. *Telemetry leads and support devices overlie the chest. Chronically coarsened interstitial changes with more hazy basilar interstitial opacity. No focal consolidative opacity, pneumothorax or effusion. Cardiomegaly is similar to priors with a calcified, tortuous aorta. No acute osseous or soft tissue abnormality. Degenerative changes are present in the imaged spine and shoulders. IMPRESSION: 1. Endotracheal tube in the mid trachea, 5 cm from the carina. 2. Transesophageal tube tip in the left upper quadrant with the side port approximating the GE junction. Could be advanced 2-3 cm for optimal function. 3. Hazy basilar opacities could reflect atelectasis or developing edema with cardiomegaly. Electronically Signed   By: Lovena Le M.D.   On: 12/05/2019 06:33    EKG: Independently reviewed.  Sinus rhythm at 74 bpm  Assessment/Plan  Subarachnoid hemorrhage Hypertensive emergency Paroxysmal atrial fibrillation on anticoagulation History of CVA with residual weakness Combined systolic and diastolic CHF DNR  Comfort care measures only Patient had just recently been discharged from hospital after coming in for hypertensive urgency and shortness of breath related to congestive heart failure exacerbation.  This morning patient had been noted to be unresponsive.  Found to have no gag or corneal reflex.  CT imaging significant for extensive subarachnoid hemorrhage.  Neurosurgery was consulted, but damage was likely not recoverable.  Patient has 2 daughters and 1 son who were contacted and recommended making patient comfort care measures only. -Admit to a MedSurg bed -End-of-life set initiated -Discontinue cardiac monitoring -Routine vital sign checks -Discontinued home medications -N.p.o. -Okay for RN to pronounce death -Aspiration precautions -Maintain IV access -Morphine drip -Zofran IV prn nausea/vomiting -Ativan IV prn anxiety/seizure  -Haloperidol IV prn agitation  or delirium -Glycopyrrolate prn excessive secretions     DVT prophylaxis: none  Code Status: DNR Family Communication: Discussed plan of care with the patient's friend present at bedside Disposition Plan: Hospitalization we will likely end in patient's death Consults called: Neurosurgery Admission status: Inpatient  Norval Morton MD [facility] Pager [phone]   If 7PM-7AM, please contact night-coverage [email or web address] Password [email or web address]  12/16/2019, 8:32 AM    Respirations

## 2019-12-06 NOTE — Progress Notes (Signed)
Patient ID: [name], male   DOB: [birth year], 79 y.o.   MRN: [id number] CT reviewed and history form EDP.Marland KitchenPhysical Exam with pontine pupils and no corneals, no gag and no movement to stim.Marland KitchenCT-scan and exam not consistent with survival.  No neurosurgical intervention indicateed and recomment palliative support.

## 2019-12-06 NOTE — Consult Note (Signed)
Responded to page, pt not available, met with pt's best friend, Ms. Dayna Barker, and doctor obtaining pt's daughter's phone number from Ms. Lowe in consult rm A. Ms. Corinna Capra says pt is estranged from his daughter all her life because he left her when she was small. She does not think daughter in [place] would even come to pt's service if he passes. Ms. Corinna Capra says pt has lived with her since she was 79 yrs old, and she has always taken care of him. She knows pt did not put anything in writing to make her his healthcare POA,though she'd asked him to, and understands she is not legal next of kin. She says pt did begin purchasing a [place], and made her beneficiary on a small life insurance policy, so that if he didn't make it, she could handle that end of it. 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 She awaits instruction from the doctor. On the spiritual side, she declined prayer, saying her brother is a Theme park manager, and she knows his pastor too, and is fine on that end. She appreciated a listening ear to share her story.   Rev. Eloise Levels Chaplain

## 2019-12-06 NOTE — ED Provider Notes (Signed)
Procedure Name: Intubation Date/Time: 01/01/2020 6:20 AM Performed by: Lorin Glass, PA-C Pre-anesthesia Checklist: Patient identified, Emergency Drugs available, Suction available, Patient being monitored and Timeout performed Oxygen Delivery Method: Ambu bag Preoxygenation: Pre-oxygenation with 100% oxygen Induction Type: Rapid sequence Ventilation: Mask ventilation without difficulty Laryngoscope Size: Glidescope and 3 Grade View: Grade II Number of attempts: 1 Airway Equipment and Method: Video-laryngoscopy and Stylet Placement Confirmation: ETT inserted through vocal cords under direct vision,  CO2 detector and Breath sounds checked- equal and bilateral Secured at: 25 cm Tube secured with: ETT holder Dental Injury: Teeth and Oropharynx as per pre-operative assessment  Comments: Direct supervision by Dr. Leonette Monarch.          Lorin Glass, PA-C 12/14/2019 0650    Fatima Blank, MD 12/27/2019 [phone]

## 2019-12-06 NOTE — ED Triage Notes (Addendum)
Pt arrived from nursing home after being found unresponsive. Last seen normal maybe sometime yesterday. Per EMS - pt pupils pinpoint with agonal breathing. Pt given Narcan 2mg  with no response.

## 2019-12-06 NOTE — ED Notes (Signed)
Pt extubated per Dr. Sedonia Small-- pt is having twitching type movements

## 2019-12-06 NOTE — ED Provider Notes (Signed)
[facility] EMERGENCY DEPARTMENT Provider Note  CSN: [id number] Arrival date & time: 12/19/2019 0608  Chief Complaint(s) Unresponsive  Pt arrived from nursing home after being found unresponsive. Last seen normal maybe sometime yesterday.  HPI [name] is a 79 y.o. male with hx listed below on Eliquis here after being found unresponsive and hypertensive at sNF. Given narcan by EMS. Required BVM for agonal breathing. Full Code per EMS  Remainder of history, ROS, and physical exam limited due to patient's condition (unresponsive). Additional information was obtained from EMS.   Level V Caveat.    HPI  Past Medical History Past Medical History:  Diagnosis Date  . Anemia, iron deficiency 08/15/2011  . CAD (coronary artery disease)    cath in 1/08: EF 60%, mild dilated Ao root, oD2 30%, LAD 20-30%.  . Dilated aortic root ([place]) 08/15/2011  . Dyslipidemia   . Dysrhythmia   . History of MI (myocardial infarction) 08/15/2011  . History of pneumonia 08/15/2011  . History of stroke   . Hyperlipidemia   . Hypertension    echocardiogram 6/10: Moderate LVH, EF 55-65%, mild, mild MR, MAC, mild LAE, PASP 32  . Myocardial infarction ([place])   . Paroxysmal atrial fibrillation (HCC)   . Prediabetes 10/08/2015  . Preoperative cardiovascular examination 10/09/2015  . Shortness of breath dyspnea   . Stroke ([place]) 10/2011  . Systolic congestive heart failure ([place]) 01/13/2017   Patient Active Problem List   Diagnosis Date Noted  . Hypertensive CHF ([place]) 11/23/2019  . Hypokalemia 08/24/2018  . Nocturia more than twice per night 08/24/2018  . Fatigue 07/14/2018  . Acute on chronic combined systolic and diastolic congestive heart failure ([place]) 06/24/2018  . Thrombocytopenia ([place]) 06/23/2018  . Neuropathy 06/23/2018  . Vitamin D deficiency 01/27/2018  . Chest pain 09/18/2017  . RLS (restless legs syndrome) 09/16/2017  . Chronic combined systolic and diastolic CHF (congestive  heart failure) ([place]) 04/27/2017  . 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 Cardiomegaly 04/03/2017  . History of stroke 04/03/2017  . Hypertensive heart disease with congestive heart failure ([place]) 04/03/2017  . Gait disorder 01/28/2017  . Acute on chronic congestive heart failure ([place]) 01/13/2017  . Insomnia 12/06/2016  . Left-sided weakness 12/03/2016  . Chronic anticoagulation 12/10/2015  . Sepsis secondary to UTI ([place]) 11/05/2015  . Lower urinary tract infectious disease 11/05/2015  . CAD in native artery 11/05/2015  . Left hemiplegia ([place]) 11/05/2015  . Sinus tachycardia   . 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 Labile blood pressure   . CVA (cerebral vascular accident) ([place])   . AF (paroxysmal atrial fibrillation) ([place])   . Carotid artery stenosis   . S/P carotid endarterectomy   . Coronary artery disease involving native coronary artery of native heart without angina pectoris   . HLD (hyperlipidemia)   . Acute blood loss anemia   . Near syncope   . Prediabetes 10/08/2015  . Right cavernous carotid stenosis   . Carotid stenosis   . Acute ischemic stroke ([place]) 10/07/2015  . Acute chest pain 06/19/2015  . DOE (dyspnea on exertion) 06/19/2015  . Hypertensive urgency 06/19/2015  . Bilateral arm pain 06/26/2014  . 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 Impingement syndrome of left shoulder 01/20/2013  . Atrial fibrillation ([place]) 04/18/2012  . Leg pain 01/28/2012  . Dizziness and giddiness 11/19/2011  . Dehydration 11/19/2011  . CKD (chronic kidney disease),  stage III 11/19/2011  . HTN (hypertension) 11/19/2011  . Dyslipidemia 11/19/2011  . Chronic anemia 11/19/2011  . Erectile dysfunction 08/20/2011  . Nocturia 08/20/2011  . Encounter for preventative adult health care exam with abnormal findings 08/15/2011  . Anemia, iron deficiency 08/15/2011  . History of pneumonia 08/15/2011  .  CAD (coronary artery disease) 08/15/2011  . History of MI (myocardial infarction) 08/15/2011  . Dilated aortic root ([place]) 08/15/2011  . Syncope 07/16/2011  . Claudication ([place]) 07/16/2011  . BPH (benign prostatic hyperplasia) 07/16/2011  . Rash 04/29/2011  . Cerebral infarction ([place]) 03/31/2011  . Inguinal hernia 03/31/2011   Home Medication(s) Prior to Admission medications   Medication Sig Start Date End Date Taking? Authorizing Provider  acetaminophen (TYLENOL) 325 MG tablet Take 2 tablets (650 mg total) by mouth every 4 (four) hours as needed for mild pain, moderate pain or headache. 11/27/19  Yes Cherene Altes, MD  apixaban (ELIQUIS) 5 MG TABS tablet Take 1 tablet (5 mg total) by mouth 2 (two) times daily for 30 days. 08/24/18 12/05/28 Yes Biagio Borg, MD  atorvastatin (LIPITOR) 80 MG tablet Take 1 tablet by mouth once daily Patient taking differently: Take 80 mg by mouth daily.  08/28/19  Yes Biagio Borg, MD  carvedilol (COREG) 12.5 MG tablet Take 1 tablet (12.5 mg total) by mouth 2 (two) times daily. 11/27/19  Yes Cherene Altes, MD  ferrous sulfate 325 (65 FE) MG tablet Take 1 tablet (325 mg total) by mouth 2 (two) times daily with a meal for 30 days. 08/24/18 12/05/28 Yes Biagio Borg, MD  hydrALAZINE (APRESOLINE) 50 MG tablet Take 1 tablet (50 mg total) by mouth every 8 (eight) hours. 11/27/19  Yes Cherene Altes, MD  isosorbide dinitrate (ISORDIL) 20 MG tablet Take 1 tablet (20 mg total) by mouth 3 (three) times daily. 11/27/19  Yes Cherene Altes, MD  lisinopril (ZESTRIL) 5 MG tablet Take 1 tablet (5 mg total) by mouth daily. 08/28/19  Yes Biagio Borg, MD  losartan-hydrochlorothiazide (HYZAAR) 100-12.5 MG tablet Take 1 tablet by mouth daily.   Yes [provider]  oxybutynin (DITROPAN-XL) 5 MG 24 hr tablet Take 1 tablet (5 mg total) by mouth at bedtime. 08/24/18  Yes Biagio Borg, MD  polyethylene glycol (MIRALAX / GLYCOLAX) 17 g packet Take 17 g by mouth  daily.   Yes [provider]  tamsulosin (FLOMAX) 0.4 MG CAPS capsule Take 1 capsule (0.4 mg total) by mouth daily after supper. 08/28/19  Yes Biagio Borg, MD                                                                                                                                    Past Surgical History Past Surgical History:  Procedure Laterality Date  . CARDIAC CATHETERIZATION    . COLONOSCOPY    . ENDARTERECTOMY Right 10/10/2015   Procedure: ENDARTERECTOMY CAROTID-RIGHT;  Surgeon: Serafina Mitchell, MD;  Location: [facility] OR;  Service: Vascular;  Laterality: Right;  . IR RADIOLOGIST EVAL & MGMT  02/11/2017  . IR RADIOLOGIST EVAL & MGMT  03/24/2017   Family History Family History  Problem Relation Age of Onset  . Aneurysm Sister   . Colon cancer Neg Hx     Social History Social History   Tobacco Use  . Smoking status: Never Smoker  . Smokeless tobacco: Never Used  Substance Use Topics  . Alcohol use: No  . Drug use: No   Allergies Patient has no known allergies.  Review of Systems Review of Systems  Unable to perform ROS: Patient unresponsive    Physical Exam Vital Signs  I have reviewed the triage vital signs BP 138/61   Pulse (!) 58   Temp (!) 96.9 F (36.1 C)   Resp (!) 22   Ht _0  (1.702 m)   Wt 73.1 kg   SpO2 99%   BMI 25.25 kg/m   Physical Exam Vitals reviewed.  Constitutional:      General: He is not in acute distress.    Appearance: He is well-developed. He is not diaphoretic.  HENT:     Head: Normocephalic and atraumatic.     Nose: Nose normal.  Eyes:     General: No scleral icterus.       Right eye: No discharge.        Left eye: No discharge.     Conjunctiva/sclera: Conjunctivae normal.     Pupils: Pupils are equal, round, and reactive to light.     Comments: Pinpoint pupils  Cardiovascular:     Rate and Rhythm: Normal rate and regular rhythm.     Heart sounds: No murmur. No friction rub. No gallop.   Pulmonary:     Effort:  Pulmonary effort is normal. No respiratory distress.     Breath sounds: Normal breath sounds. No stridor. No rales.  Abdominal:     General: There is no distension.     Palpations: Abdomen is soft.     Tenderness: There is no abdominal tenderness.  Musculoskeletal:        General: No tenderness.     Cervical back: Normal range of motion and neck supple.  Skin:    General: Skin is warm and dry.     Findings: No erythema or rash.  Neurological:     Mental Status: He is unresponsive.     GCS: GCS eye subscore is 1. GCS verbal subscore is 1. GCS motor subscore is 1.     Comments: No corneal or gag reflex.      ED Results and Treatments Labs (all labs ordered are listed, but only abnormal results are displayed) Labs Reviewed  CBC - Abnormal; Notable for the following components:      Result Value   RBC 6.08 (*)    MCV 72.5 (*)    MCH 21.7 (*)    MCHC 29.9 (*)    RDW 18.4 (*)    All other components within normal limits  COMPREHENSIVE METABOLIC PANEL - Abnormal; Notable for the following components:   CO2 20 (*)    Glucose, Bld 161 (*)    Creatinine, Ser 1.30 (*)    Albumin 3.3 (*)    GFR calc non Af Amer 53 (*)    All other components within normal limits  I-STAT CHEM 8, ED - Abnormal; Notable for the following components:   Potassium 3.4 (*)  BUN 25 (*)    Glucose, Bld 154 (*)    All other components within normal limits  CBG MONITORING, ED - Abnormal; Notable for the following components:   Glucose-Capillary 151 (*)    All other components within normal limits  POCT I-STAT 7, (LYTES, BLD GAS, ICA,H+H) - Abnormal; Notable for the following components:   pH, Arterial 7.469 (*)    pO2, Arterial 349 (*)    All other components within normal limits  RESPIRATORY PANEL BY RT PCR (FLU A&B, COVID)  PROTIME-INR  APTT  DIFFERENTIAL  TRIGLYCERIDES  CDS SEROLOGY  BLOOD GAS, ARTERIAL                                                                                                                          EKG  EKG Interpretation  Date/Time:  Wednesday Dec 06 2019 06:11:11 EDT Ventricular Rate:  74 PR Interval:    QRS Duration: 105 QT Interval:  408 QTC Calculation: 453 R Axis:   5 Text Interpretation: Sinus or ectopic atrial rhythm Atrial premature complex Borderline prolonged PR interval LVH with secondary repolarization abnormality Confirmed by Addison Lank [id number]) on 12/23/2019 6:19:55 AM Also confirmed by Addison Lank [phone]), editor Lynder Parents [phone])  on 12/09/2019 8:36:15 AM      Radiology CT HEAD WO CONTRAST  Result Date: 12/22/2019 CLINICAL DATA:  Air distended air trapping elevated crit Dom a EXAM: CT HEAD WITHOUT CONTRAST TECHNIQUE: Contiguous axial images were obtained from the base of the skull through the vertex without intravenous contrast. COMPARISON:  CT 10/11/2015, MRA 11/17/2019, MRI 10/11/2015 FINDINGS: Brain: There is extensive high volume subarachnoid hemorrhage throughout the basilar cisterns with some irregular hypodensity seen about the basilar tip. Additional hyperdense hemorrhage appears to track along the left MCA and into the sulci of the left frontal lobe. There is a large volume of intraventricular hemorrhage as well with more layering hemorrhage posteriorly. The significant interval increase in the ventricular caliber from comparison MR angiogram 11/17/2019 as well as periventricular hypoattenuation is compatible with acute obstructive hydrocephalus. Findings are on a background of advanced chronic white matter changes and diffuse parenchymal volume loss seen on comparison studies. No convincing CT evidence of acute large territory ischemic infarction Vascular: Hyperdensity along the left MCA distribution, favor subarachnoid blood. Irregular hypoattenuation at the level of the basilar tip, does raise some suspicion for a sentinel clot sign. Skull: No calvarial fracture or suspicious osseous lesion. No scalp swelling or hematoma.  Sinuses/Orbits: Mild thickening of the ethmoid sinuses. Fluid level in the left maxillary sinus with more inspissated debris, nodular mural thickening and sclerotic changes of the sinus perimeter suggesting chronicity. Mastoid air cells are well aerated. Included orbital structures are unremarkable. Other: Extensive severe periodontal disease. Patient is intubated at the time of exam. IMPRESSION: 1. Extensive high volume subarachnoid hemorrhage throughout the basilar cisterns with some irregular hypodensity seen about the basilar tip. Additional hyperdense hemorrhage appears to track along the left MCA and into  the sulci of the bilateral frontal lobes and temporal lobes as well as the cerebellum. 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 Hypodensity along the basilar tip is could suggest a possible sentinel clot sign. 2. There is a large volume of intraventricular hemorrhage as well with more layering hemorrhage posteriorly. The significant interval increase in the ventricular caliber as well as periventricular hypoattenuation is compatible with acute obstructive hydrocephalus. 3. Background of extensive chronic white matter changes and diffuse parenchymal volume loss. Critical Value/emergent results were called by telephone at the time of discovery on 12/16/2019 at 7:00 am to provider [facility] , who verbally acknowledged these results. Electronically Signed   By: Lovena Le M.D.   On: 12/14/2019 07:13   DG Chest Portable 1 View  Result Date: 12/18/2019 CLINICAL DATA:  Tube placement EXAM: PORTABLE CHEST 1 VIEW COMPARISON:  Radiograph 12/31/2016, CT 01/01/2017 FINDINGS: *Endotracheal tube in the mid trachea, 5 cm from the carina. *Transesophageal tube tip in the left upper quadrant with the side port approximating the GE junction. Could be advanced 2-3 cm for optimal function. *Telemetry leads and support devices overlie the chest. Chronically coarsened interstitial changes with more hazy basilar  interstitial opacity. No focal consolidative opacity, pneumothorax or effusion. Cardiomegaly is similar to priors with a calcified, tortuous aorta. No acute osseous or soft tissue abnormality. Degenerative changes are present in the imaged spine and shoulders. IMPRESSION: 1. Endotracheal tube in the mid trachea, 5 cm from the carina. 2. Transesophageal tube tip in the left upper quadrant with the side port approximating the GE junction. Could be advanced 2-3 cm for optimal function. 3. Hazy basilar opacities could reflect atelectasis or developing edema with cardiomegaly. Electronically Signed   By: Lovena Le M.D.   On: 12/29/2019 06:33    Pertinent labs & imaging results that were available during my care of the patient were reviewed by me and considered in my medical decision making (see chart for details).  Medications Ordered in ED Medications  clevidipine (CLEVIPREX) infusion 0.5 mg/mL (8 mg/hr Intravenous Rate/Dose Change 12/29/2019 0744)  fentaNYL (SUBLIMAZE) injection 100 mcg (has no administration in time range)  fentaNYL (SUBLIMAZE) injection 100 mcg (has no administration in time range)  propofol (DIPRIVAN) 1000 MG/100ML infusion (has no administration in time range)  etomidate (AMIDATE) injection (25 mg Intravenous Given 12/03/2019 0614)  rocuronium (ZEMURON) injection (75 mg Intravenous Given 12/28/2019 0614)  coag fact Xa recombinant (ANDEXXA) low dose infusion 900 mg (900 mg Intravenous New Bag/Given 12/26/2019 0734)                                                                                                                                    Procedures .Critical Care Performed by: Fatima Blank, MD Authorized by: Fatima Blank, MD    CRITICAL CARE Performed by: Grayce Sessions Mical Kicklighter Total critical care time: 160 minutes Critical care time was exclusive of separately billable procedures and  treating other patients. Critical care was necessary to treat or prevent  imminent or life-threatening deterioration. Critical care was time spent personally by me on the following activities: development of treatment plan with patient and/or surrogate as well as nursing, discussions with consultants, evaluation of patient's response to treatment, examination of patient, obtaining history from patient or surrogate, ordering and performing treatments and interventions, ordering and review of laboratory studies, ordering and review of radiographic studies, pulse oximetry and re-evaluation of patient's condition.    (including critical care time)  Medical Decision Making / ED Course I have reviewed the nursing notes for this encounter and the patient's prior records (if available in EHR or on provided paperwork).   AZEEZ DUNKER was evaluated in Emergency Department on 12/22/2019 for the symptoms described in the history of present illness. He was evaluated in the context of the global COVID-19 pandemic, which necessitated consideration that the patient might be at risk for infection with the SARS-CoV-2 virus that causes COVID-19. Institutional protocols and algorithms that pertain to the evaluation of patients at risk for COVID-19 are in a state of rapid change based on information released by regulatory bodies including the CDC and federal and state organizations. These policies and algorithms were followed during the patient's care in the ED.  Unresponsive. No response to painful stimuli. No gag or corneal reflex. Intubated for airway protection. Severely hypertensive. Concerning for ICH. Started on cleviprex.  CT head with large SAH with hydrocephalus. Possible aneurysm. Anticoagulation reversal started.  [facility] consulted, who based on the exam and imaging feel this is not survivable.    Emergent contact [name] Lowe-Williams hip alcohol you ingested lobe) is here and requesting update. She is not the legal power of attorney, thus limited information was shared and  was informed that we would not be able to relay any additional information nor could allow her to see the patient until we discuss his status with family and they have agreed for [place] to share additional information. Since we do not have family contact, we asked her for assistance reaching them.   We were able to reach family who verified the patient's information.  They were able to verify patient's information and verified that we could share information with the caregiver.  They were all made aware of the patient's condition and poor prognosis.   After several discussions, family chose to proceed with comfort care.  However before extubating they would like the patient's pastor to be here.  Ms. Jimmye Norman will coordinate and return with the pastor.   I have consulted hospitalist team, Dr. Tamala Julian, who is aware of the situation and timeline.  If after extubation patient has prolonged stay, he will be admitted to the hospital for continued comfort measures.       Final Clinical Impression(s) / ED Diagnoses Final diagnoses:  Subarachnoid hemorrhage ([place])      This chart was dictated using voice recognition software.  Despite best efforts to proofread,  errors can occur which can change the documentation meaning.   Fatima Blank, MD 12/21/2019 [phone]

## 2019-12-06 NOTE — Progress Notes (Signed)
Patient transported to CT scan and back to Resuscitation room.

## 2019-12-07 DIAGNOSIS — Z515 Encounter for palliative care: Secondary | ICD-10-CM

## 2019-12-07 MED ORDER — CHLORHEXIDINE GLUCONATE CLOTH 2 % EX PADS
6.0000 | MEDICATED_PAD | Freq: Every day | CUTANEOUS | Status: DC
  Administered 2019-12-07: 6 via TOPICAL

## 2019-12-08 NOTE — Progress Notes (Signed)
The [facility] was able to get the phone numbers of pt's daughters and chart was updated, Both daughters were called and informed about patient's passing by this RN, one daughter (Ms Priscille Loveless) verbally said she gave permission to the Admitting Doctor to speak and allow Ms Corinna Capra to make decisions on their behalf in the events of their Dad's passing and funeral. The phone call was then forwarded to Ms Theadora Rama in patient placement. Obasogie-Asidi, Rochell Puett Efe

## 2020-01-02 NOTE — Progress Notes (Signed)
Morphine drips stopped @2042 . 45ml was wasted in the stericycle and witnessed by [employer].

## 2020-01-02 NOTE — Progress Notes (Addendum)
The urethral catheter and the two peripheral IV on the right and left hands were completely removed.

## 2020-01-02 NOTE — Progress Notes (Addendum)
The patient stopped breathing @2040 . The charge nurse was notified. Two nurses certified the patient passed away. MD was paged and the family was called. Awaiting the family at the hospital.

## 2020-01-02 NOTE — Progress Notes (Signed)
Pt passed away at 2040, family on record (Ms Corinna Capra) called and notified by pt's RN, orders in place to allow body to be taken by the [facility] as requested by Ms Corinna Capra, however according to patient placement, Ms Corinna Capra is listed as a friend and cannot make decisions since the deceased pt had children. Attempted to call Ms Corinna Capra to get any of pt's children's phone numbers, no answer, voice message dropped to call back. The [facility] was notified to decide on how to proceed, said to clean and prepare the body to be taken to the morgue since the immediate family cannot be reached at this time. Obasogie-Asidi, Melanye Hiraldo Efe

## 2020-01-02 NOTE — Progress Notes (Signed)
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  Upon arrival to emergency department, patient was unresponsive to painful stimuli, no gag or corneal reflexes.  Initially intubated for airway protection.  CT scan of the head showed extensive subarachnoid and intraventricular bleeding.  Anticoagulation reversal was attempted.  Neurosurgery consulted and they recommended that this is irreversible and no intervention possible.  Family was consulted and patient started on comfort care measures with imminent death.   Assessment & Plan:   Principal Problem:   SAH (subarachnoid hemorrhage) (HCC) Active Problems:   AF (paroxysmal atrial fibrillation) (HCC)   Chronic anticoagulation   History of CVA with residual deficit   Comfort measures only status   DNR (do not resuscitate)   Hypertensive emergency   Admission for end of life care  Subarachnoid hemorrhage/intraventricular hemorrhage History of thrombotic stroke Paroxysmal atrial fibrillation End-of-life care Comfort care measures  Plan: Patient is actively dying. All symptom control medications to relieve his stress and discomfort at end of life. Continue Foley catheter for comfort. Patient is actively dying, unable to transfer to hospice.  Anticipate hospital death. Will allow visitors as per hospital policy. RNs to pronounce death if happens in the hospital.   DVT prophylaxis: Comfort care Code Status: DNR comfort Family Communication: None at the bedside Disposition Plan: Status is: Inpatient  Remains inpatient appropriate because:Hemodynamically unstable,  Altered mental status and Actively dying.   Dispo: The patient is from: SNF              Anticipated d/c is to: Unknown.              Anticipated d/c date is: 2 days              Patient currently is not medically stable to d/c.          Consultants:   Neurosurgery  Procedures:   None  Antimicrobials:   None   Subjective: Patient seen and examined.  Unresponsive.  Remains comfortable on morphine drip.  Objective: Vitals:   12/20/2019 1945 12/27/2019 2043 12/18/2019 2342 2019/12/12 0839  BP:  131/72 (!) 154/72 (!) 183/78  Pulse: 100 100 76 88  Resp: (!) 8 12 15 20   Temp:  100.3 F (37.9 C) 98.5 F (36.9 C) 97.8 F (36.6 C)  TempSrc:  Axillary Oral Axillary  SpO2: (!) 84% (!) 80% 93% 95%  Weight:      Height:        Intake/Output Summary (Last 24 hours) at 12/12/2019 1103 Last data filed at 12/12/2019 0322 Gross per 24 hour  Intake 100 ml  Output 2250 ml  Net -2150 ml   Filed Weights   12/31/2019 0735  Weight: 73.1 kg    Examination:  Physical Exam  Constitutional:  Patient is obtunded with some posturing.  Looks comfortable on morphine infusion. Pupils are bilateral pinpoint and nonreactive. Foley catheter with clear urine.     Data Reviewed: I have personally reviewed following labs and imaging studies  CBC: Recent Labs  Lab 12/26/2019 0610 12/09/2019 0630 12/05/2019 0708  WBC 6.7  --   --   NEUTROABS 4.0  --   --   HGB 13.2 13.9 13.3  HCT  44.1 41.0 39.0  MCV 72.5*  --   --   PLT 192  --   --    Basic Metabolic Panel: Recent Labs  Lab 12/25/2019 0610 12/17/2019 0630 12/25/2019 0708  NA 139 141 142  K 3.7 3.4* 3.5  CL 106 107  --   CO2 20*  --   --   GLUCOSE 161* 154*  --   BUN 22 25*  --   CREATININE 1.30* 1.20  --   CALCIUM 9.0  --   --    GFR: Estimated Creatinine Clearance: 48.2 mL/min (by C-G formula based on SCr of 1.2 mg/dL). Liver Function Tests: Recent Labs  Lab 12/25/2019 0610  AST 29  ALT 19  ALKPHOS 118  BILITOT 0.5  PROT 7.7   ALBUMIN 3.3*   No results for input(s): LIPASE, AMYLASE in the last 168 hours. No results for input(s): AMMONIA in the last 168 hours. Coagulation Profile: Recent Labs  Lab 12/20/2019 0610  INR 1.1   Cardiac Enzymes: No results for input(s): CKTOTAL, CKMB, CKMBINDEX, TROPONINI in the last 168 hours. BNP (last 3 results) No results for input(s): PROBNP in the last 8760 hours. HbA1C: No results for input(s): HGBA1C in the last 72 hours. CBG: Recent Labs  Lab 12/03/2019 0615  GLUCAP 151*   Lipid Profile: Recent Labs    12/29/2019 0610  TRIG 78   Thyroid Function Tests: No results for input(s): TSH, T4TOTAL, FREET4, T3FREE, THYROIDAB in the last 72 hours. Anemia Panel: No results for input(s): VITAMINB12, FOLATE, FERRITIN, TIBC, IRON, RETICCTPCT in the last 72 hours. Sepsis Labs: No results for input(s): PROCALCITON, LATICACIDVEN in the last 168 hours.  Recent Results (from the past 240 hour(s))  SARS CORONAVIRUS 2 (TAT 6-24 HRS) Nasopharyngeal Nasopharyngeal Swab     Status: None   Collection Time: 11/27/19 11:41 AM   Specimen: Nasopharyngeal Swab  Result Value Ref Range Status   SARS Coronavirus 2 NEGATIVE NEGATIVE Final    Comment: (NOTE) SARS-CoV-2 target nucleic acids are NOT DETECTED. The SARS-CoV-2 RNA is generally detectable in upper and lower respiratory specimens during the acute phase of infection. Negative results do not preclude SARS-CoV-2 infection, do not rule out co-infections with other pathogens, and should not be used as the sole basis for treatment or other patient management decisions. Negative results must be combined with clinical observations, patient history, and epidemiological information. The expected result is Negative. Fact Sheet for Patients: SugarRoll.be Fact Sheet for Healthcare Providers: https://www.woods-mathews.com/ This test is not yet approved or cleared by the [place] FDA and  has  been authorized for detection and/or diagnosis of SARS-CoV-2 by FDA under an Emergency Use Authorization (EUA). This EUA will remain  in effect (meaning this test can be used) for the duration of the COVID-19 declaration under Section 56 4(b)(1) of the Act, 21 U.S.C. section 360bbb-3(b)(1), unless the authorization is terminated or revoked sooner. Performed at [facility], [place] [address], [place], [place] [address]   Respiratory Panel by RT PCR (Flu A&B, Covid) - Nasopharyngeal Swab     Status: None   Collection Time: 12/22/2019  6:20 AM   Specimen: Nasopharyngeal Swab  Result Value Ref Range Status   SARS Coronavirus 2 by RT PCR NEGATIVE NEGATIVE Final    Comment: (NOTE) SARS-CoV-2 target nucleic acids are NOT DETECTED. The SARS-CoV-2 RNA is generally detectable in upper respiratoy specimens during the acute phase of infection. The lowest concentration of SARS-CoV-2 viral copies this assay can detect is  131 copies/mL. A negative result does not preclude SARS-Cov-2 infection and should not be used as the sole basis for treatment or other patient management decisions. A negative result may occur with  improper specimen collection/handling, submission of specimen other than nasopharyngeal swab, presence of viral mutation(s) within the areas targeted by this assay, and inadequate number of viral copies (<131 copies/mL). A negative result must be combined with clinical observations, patient history, and epidemiological information. The expected result is Negative. Fact Sheet for Patients:  PinkCheek.be Fact Sheet for Healthcare Providers:  GravelBags.it This test is not yet ap proved or cleared by the [place] FDA and  has been authorized for detection and/or diagnosis of SARS-CoV-2 by FDA under an Emergency Use Authorization (EUA). This EUA will remain  in effect (meaning this test can be used) for the duration of  the COVID-19 declaration under Section 564(b)(1) of the Act, 21 U.S.C. section 360bbb-3(b)(1), unless the authorization is terminated or revoked sooner.    Influenza A by PCR NEGATIVE NEGATIVE Final   Influenza B by PCR NEGATIVE NEGATIVE Final    Comment: (NOTE) The Xpert Xpress SARS-CoV-2/FLU/RSV assay is intended as an aid in  the diagnosis of influenza from Nasopharyngeal swab specimens and  should not be used as a sole basis for treatment. Nasal washings and  aspirates are unacceptable for Xpert Xpress SARS-CoV-2/FLU/RSV  testing. Fact Sheet for Patients: PinkCheek.be Fact Sheet for Healthcare Providers: GravelBags.it This test is not yet approved or cleared by the [place] FDA and  has been authorized for detection and/or diagnosis of SARS-CoV-2 by  FDA under an Emergency Use Authorization (EUA). This EUA will remain  in effect (meaning this test can be used) for the duration of the  Covid-19 declaration under Section 564(b)(1) of the Act, 21  U.S.C. section 360bbb-3(b)(1), unless the authorization is  terminated or revoked. Performed at [facility], [place] [address] [place], [place] [address]          Radiology Studies: CT HEAD WO CONTRAST  Result Date: 12/05/2019 CLINICAL DATA:  Air distended air trapping elevated crit Dom a EXAM: CT HEAD WITHOUT CONTRAST TECHNIQUE: Contiguous axial images were obtained from the base of the skull through the vertex without intravenous contrast. COMPARISON:  CT 10/11/2015, MRA 11/17/2019, MRI 10/11/2015 FINDINGS: Brain: There is extensive high volume subarachnoid hemorrhage throughout the basilar cisterns with some irregular hypodensity seen about the basilar tip. Additional hyperdense hemorrhage appears to track along the left MCA and into the sulci of the left frontal lobe. There is a large volume of intraventricular hemorrhage as well with more layering hemorrhage  posteriorly. The significant interval increase in the ventricular caliber from comparison MR angiogram 11/17/2019 as well as periventricular hypoattenuation is compatible with acute obstructive hydrocephalus. Findings are on a background of advanced chronic white matter changes and diffuse parenchymal volume loss seen on comparison studies. No convincing CT evidence of acute large territory ischemic infarction Vascular: Hyperdensity along the left MCA distribution, favor subarachnoid blood. Irregular hypoattenuation at the level of the basilar tip, does raise some suspicion for a sentinel clot sign. Skull: No calvarial fracture or suspicious osseous lesion. No scalp swelling or hematoma. Sinuses/Orbits: Mild thickening of the ethmoid sinuses. Fluid level in the left maxillary sinus with more inspissated debris, nodular mural thickening and sclerotic changes of the sinus perimeter suggesting chronicity. Mastoid air cells are well aerated. Included orbital structures are unremarkable. Other: Extensive severe periodontal disease. Patient is intubated at the time of exam. IMPRESSION:  1. Extensive high volume subarachnoid hemorrhage throughout the basilar cisterns with some irregular hypodensity seen about the basilar tip. Additional hyperdense hemorrhage appears to track along the left MCA and into the sulci of the bilateral frontal lobes and temporal lobes as well as the cerebellum. 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 Hypodensity along the basilar tip is could suggest a possible sentinel clot sign. 2. There is a large volume of intraventricular hemorrhage as well with more layering hemorrhage posteriorly. The significant interval increase in the ventricular caliber as well as periventricular hypoattenuation is compatible with acute obstructive hydrocephalus. 3. Background of extensive chronic white matter changes and diffuse parenchymal volume loss. Critical Value/emergent results were called by telephone at  the time of discovery on 01/01/2020 at 7:00 am to provider [facility] , who verbally acknowledged these results. Electronically Signed   By: Lovena Le M.D.   On: 12/29/2019 07:13   DG Chest Portable 1 View  Result Date: 12/12/2019 CLINICAL DATA:  Tube placement EXAM: PORTABLE CHEST 1 VIEW COMPARISON:  Radiograph 12/31/2016, CT 01/01/2017 FINDINGS: *Endotracheal tube in the mid trachea, 5 cm from the carina. *Transesophageal tube tip in the left upper quadrant with the side port approximating the GE junction. Could be advanced 2-3 cm for optimal function. *Telemetry leads and support devices overlie the chest. Chronically coarsened interstitial changes with more hazy basilar interstitial opacity. No focal consolidative opacity, pneumothorax or effusion. Cardiomegaly is similar to priors with a calcified, tortuous aorta. No acute osseous or soft tissue abnormality. Degenerative changes are present in the imaged spine and shoulders. IMPRESSION: 1. Endotracheal tube in the mid trachea, 5 cm from the carina. 2. Transesophageal tube tip in the left upper quadrant with the side port approximating the GE junction. Could be advanced 2-3 cm for optimal function. 3. Hazy basilar opacities could reflect atelectasis or developing edema with cardiomegaly. Electronically Signed   By: Lovena Le M.D.   On: 12/28/2019 06:33        Scheduled Meds: . Chlorhexidine Gluconate Cloth  6 each Topical Daily   Continuous Infusions: . morphine 1 mg/hr (12/22/2019 1537)     LOS: 1 day    Time spent: 30 minutes    Barb Merino, MD [facility] Pager [phone]

## 2020-01-02 NOTE — Death Summary Note (Signed)
DEATH SUMMARY   Patient Details  Name: [name] MRN: [id number] DOB: 1941-03-04  Admission/Discharge Information   Admit Date:  12-16-2019  Date of Death: Date of Death: 2019/12/17  Time of Death: Time of Death: 11-04-[birth year]  Length of Stay: 2  Referring Physician: Biagio Borg, MD   Reason(s) for Hospitalization  Intracranial hemorrhage  Diagnoses  Preliminary cause of death:  Secondary Diagnoses (including complications and co-morbidities):  Principal Problem:   SAH (subarachnoid hemorrhage) ([place]) Active Problems:   AF (paroxysmal atrial fibrillation) ([place])   Chronic anticoagulation   History of CVA with residual deficit   Comfort measures only status   DNR (do not resuscitate)   Hypertensive emergency   Admission for end of life care   [place] [facility] Course (including significant findings, care, treatment, and services provided and events leading to death)  [name] is a 79 y.o. year old male who has extensive medical issues including stroke with residual paralysis, hypertension, coronary artery disease, paroxysmal atrial fibrillation, chronic combined heart failure, on long-term anticoagulation with Eliquis who was recently at nursing home and brought to the emergency room with unresponsiveness.  Patient was found unresponsive to painful stimuli with no gag and corneal reflexes.  He was initially intubated for airway protection.  CT scan of the head showed extensive subarachnoid and intraventricular bleeding.  Anticoagulation reversal was done.  Patient had irreversible brain damage and there was no intervention possible.  Family was consulted and patient was started on comfort care measures. Patient was provided end-of-life care in the hospital and he ultimately died on 12-17-2019 at 2038-11-04.    Pertinent Labs and Studies  Significant Diagnostic Studies DG Chest 2 View  Result Date: 11/21/2019 CLINICAL DATA:  Shortness of breath EXAM: CHEST - 2 VIEW COMPARISON:   04/03/2017 FINDINGS: Stable cardiomegaly. Aortic calcified and tortuous. Diffuse interstitial prominence. Patchy bibasilar opacities. Trace bilateral pleural effusions. No pneumothorax is seen, although lung apices are obscured. Chronic fusion of the T9 and T10 vertebral bodies. IMPRESSION: Findings suggestive of CHF with pulmonary edema and trace bilateral pleural effusions. Electronically Signed   By: Davina Poke D.O.   On: 11/21/2019 11:21   CT HEAD WO CONTRAST  Result Date: 12-16-2019 CLINICAL DATA:  Air distended air trapping elevated crit Dom a EXAM: CT HEAD WITHOUT CONTRAST TECHNIQUE: Contiguous axial images were obtained from the base of the skull through the vertex without intravenous contrast. COMPARISON:  CT 10/11/2015, MRA 11/17/2019, MRI 10/11/2015 FINDINGS: Brain: There is extensive high volume subarachnoid hemorrhage throughout the basilar cisterns with some irregular hypodensity seen about the basilar tip. Additional hyperdense hemorrhage appears to track along the left MCA and into the sulci of the left frontal lobe. There is a large volume of intraventricular hemorrhage as well with more layering hemorrhage posteriorly. The significant interval increase in the ventricular caliber from comparison MR angiogram 11/17/2019 as well as periventricular hypoattenuation is compatible with acute obstructive hydrocephalus. Findings are on a background of advanced chronic white matter changes and diffuse parenchymal volume loss seen on comparison studies. No convincing CT evidence of acute large territory ischemic infarction Vascular: Hyperdensity along the left MCA distribution, favor subarachnoid blood. Irregular hypoattenuation at the level of the basilar tip, does raise some suspicion for a sentinel clot sign. Skull: No calvarial fracture or suspicious osseous lesion. No scalp swelling or hematoma. Sinuses/Orbits: Mild thickening of the ethmoid sinuses. Fluid level in the left maxillary sinus with  more inspissated debris, nodular mural thickening and sclerotic changes  of the sinus perimeter suggesting chronicity. Mastoid air cells are well aerated. Included orbital structures are unremarkable. Other: Extensive severe periodontal disease. Patient is intubated at the time of exam. IMPRESSION: 1. Extensive high volume subarachnoid hemorrhage throughout the basilar cisterns with some irregular hypodensity seen about the basilar tip. Additional hyperdense hemorrhage appears to track along the left MCA and into the sulci of the bilateral frontal lobes and temporal lobes as well as the cerebellum. 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 Hypodensity along the basilar tip is could suggest a possible sentinel clot sign. 2. There is a large volume of intraventricular hemorrhage as well with more layering hemorrhage posteriorly. The significant interval increase in the ventricular caliber as well as periventricular hypoattenuation is compatible with acute obstructive hydrocephalus. 3. Background of extensive chronic white matter changes and diffuse parenchymal volume loss. Critical Value/emergent results were called by telephone at the time of discovery on 12/03/2019 at 7:00 am to provider [facility] , who verbally acknowledged these results. Electronically Signed   By: Lovena Le M.D.   On: 12/23/2019 07:13   MR ANGIO HEAD WO CONTRAST  Result Date: 11/18/2019 CLINICAL DATA:  CVA.  Intracranial stenoses. EXAM: MRA HEAD WITHOUT CONTRAST TECHNIQUE: Angiographic images of the Circle of Willis were obtained using MRA technique without intravenous contrast. COMPARISON:  Cerebral arteriogram 10/08/2015. FINDINGS: Right supraclinoid stenosis is again seen. Mild narrowing of the cavernous left ICA is evident. The non dominant right A1 segment demonstrates a high-grade stenosis. M1 segments are normal bilaterally. MCA bifurcations are within normal limits. Proximal M2 segment stenoses are present bilaterally left  greater than right. Severe stenosis is in the superior left M2 segment. Segmental narrowing is present in the distal ACA branch vessels. Severe bilateral V4 segment stenoses are present, right greater than left. High-grade stenosis of the proximal basilar artery is again seen. Moderate proximal PCA stenoses are present bilaterally with multiple more distal segmental stenoses. IMPRESSION: 1. Right greater than left cavernous internal carotid artery stenosis. 2. High-grade stenosis of the non dominant right A1 segment. 3. Severe stenosis in the superior left M2 segment. 4. Severe bilateral V4 segment stenoses. 5. High-grade stenosis of the proximal basilar artery. 6. Moderate proximal PCA stenoses bilaterally. Electronically Signed   By: San Morelle M.D.   On: 11/18/2019 16:41   DG Chest Portable 1 View  Result Date:  CLINICAL DATA:  Tube placement EXAM: PORTABLE CHEST 1 VIEW COMPARISON:  Radiograph 12/31/2016, CT 01/01/2017 FINDINGS: *Endotracheal tube in the mid trachea, 5 cm from the carina. *Transesophageal tube tip in the left upper quadrant with the side port approximating the GE junction. Could be advanced 2-3 cm for optimal function. *Telemetry leads and support devices overlie the chest. Chronically coarsened interstitial changes with more hazy basilar interstitial opacity. No focal consolidative opacity, pneumothorax or effusion. Cardiomegaly is similar to priors with a calcified, tortuous aorta. No acute osseous or soft tissue abnormality. Degenerative changes are present in the imaged spine and shoulders. IMPRESSION: 1. Endotracheal tube in the mid trachea, 5 cm from the carina. 2. Transesophageal tube tip in the left upper quadrant with the side port approximating the GE junction. Could be advanced 2-3 cm for optimal function. 3. Hazy basilar opacities could reflect atelectasis or developing edema with cardiomegaly. Electronically Signed   By: Lovena Le M.D.   On: 12/06/2019 06:33    ECHOCARDIOGRAM COMPLETE  Result Date: 11/22/2019    ECHOCARDIOGRAM REPORT   Patient Name:   [name] Date of Exam:  11/22/2019 Medical Rec #:  [id number]        Height:       66.5 in Accession #:    [id number]       Weight:       165.0 lb Date of Birth:  11/29/1940         BSA:          1.853 m Patient Age:    42 years         BP:           152/101 mmHg Patient Gender: M                HR:           69 bpm. Exam Location:  Inpatient Procedure: 2D Echo Indications:    Congestive Heart Failure 428.0 / I50.9  History:        Patient has prior history of Echocardiogram examinations, most                 recent 04/05/2017. CAD, Stroke, Mitral valve regurgitation,                 Signs/Symptoms:Syncope and Chest Pain; Risk Factors:Hypertension                 and Dyslipidemia. CKD.  Sonographer:    Vikki Ports Turrentine Referring Phys: [place]  1. Left ventricular ejection fraction, by estimation, is 35 to 40%. The left ventricle has moderately decreased function. The left ventricle demonstrates global hypokinesis. There is moderate concentric left ventricular hypertrophy. Left ventricular diastolic parameters are consistent with Grade II diastolic dysfunction (pseudonormalization). Elevated left atrial pressure.  2. Right ventricular systolic function is mildly reduced. The right ventricular size is moderately enlarged. There is normal pulmonary artery systolic pressure. The estimated right ventricular systolic pressure is 123456 mmHg.  3. Left atrial size was severely dilated.  4. Right atrial size was severely dilated.  5. The mitral valve is grossly normal. Mild mitral valve regurgitation. No evidence of mitral stenosis.  6. Severely calcified aortic valve with restricted movement. V max 1.8 m/s, mean gradient 8 mmHG, AVA 1.49 cm2, DI 0.48. Overall, there is likely at least moderate aortic stenosis. The gradients are underestimated due to low stroke volume (SV=54 cc, SVI=29 cc/m2). The aortic  valve is tricuspid. Aortic valve regurgitation is mild. Moderate aortic valve stenosis. Aortic valve area, by VTI measures 1.49 cm. Aortic valve mean gradient measures 8.0 mmHg. Aortic valve Vmax measures 1.80 m/s.  7. The inferior vena cava is dilated in size with >50% respiratory variability, suggesting right atrial pressure of 8 mmHg. Comparison(s): Changes from prior study are noted. EF 35-40%. At least moderate aortic stenosis. FINDINGS  Left Ventricle: Left ventricular ejection fraction, by estimation, is 35 to 40%. The left ventricle has moderately decreased function. The left ventricle demonstrates global hypokinesis. The left ventricular internal cavity size was normal in size. There is moderate concentric left ventricular hypertrophy. Left ventricular diastolic parameters are consistent with Grade II diastolic dysfunction (pseudonormalization). Elevated left atrial pressure. Right Ventricle: The right ventricular size is moderately enlarged. No increase in right ventricular wall thickness. Right ventricular systolic function is mildly reduced. There is normal pulmonary artery systolic pressure. The tricuspid regurgitant velocity is 1.68 m/s, and with an assumed right atrial pressure of 8 mmHg, the estimated right ventricular systolic pressure is 123456 mmHg. Left Atrium: Left atrial size was severely dilated. Right Atrium: Right atrial size was severely dilated. Pericardium: Trivial  pericardial effusion is present. Mitral Valve: The mitral valve is grossly normal. There is mild calcification of the anterior mitral valve leaflet(s). Mild mitral valve regurgitation. No evidence of mitral valve stenosis. Tricuspid Valve: The tricuspid valve is grossly normal. Tricuspid valve regurgitation is trivial. No evidence of tricuspid stenosis. Aortic Valve: Severely calcified aortic valve with restricted movement. V max 1.8 m/s, mean gradient 8 mmHG, AVA 1.49 cm2, DI 0.48. Overall, there is likely at least moderate  aortic stenosis. The gradients are underestimated due to low stroke volume (SV=54 cc, SVI=29 cc/m2). The aortic valve is tricuspid. . There is severe thickening and severe calcifcation of the aortic valve. Aortic valve regurgitation is mild. Moderate aortic stenosis is present. There is severe thickening of the aortic valve. There is severe calcifcation of the aortic valve. Aortic valve mean gradient measures 8.0 mmHg. Aortic valve peak gradient measures 13.0 mmHg. Aortic valve area, by VTI measures 1.49 cm. Pulmonic Valve: The pulmonic valve was grossly normal. Pulmonic valve regurgitation is mild. No evidence of pulmonic stenosis. Aorta: The aortic root is normal in size and structure. Venous: The inferior vena cava is dilated in size with greater than 50% respiratory variability, suggesting right atrial pressure of 8 mmHg. IAS/Shunts: The atrial septum is grossly normal.  LEFT VENTRICLE PLAX 2D LVIDd:         4.70 cm  Diastology LVIDs:         3.33 cm  LV e' lateral:   5.70 cm/s LV PW:         1.48 cm  LV E/e' lateral: 13.9 LV IVS:        1.46 cm  LV e' medial:    3.44 cm/s LVOT diam:     2.00 cm  LV E/e' medial:  23.1 LV SV:         54 LV SV Index:   29 LVOT Area:     3.14 cm  RIGHT VENTRICLE RV S prime:     9.89 cm/s TAPSE (M-mode): 1.4 cm LEFT ATRIUM              Index       RIGHT ATRIUM           Index LA diam:        4.80 cm  2.59 cm/m  RA Area:     22.40 cm LA Vol (A2C):   101.0 ml 54.50 ml/m RA Volume:   72.70 ml  39.23 ml/m LA Vol (A4C):   136.0 ml 73.39 ml/m LA Biplane Vol: 123.0 ml 66.37 ml/m  AORTIC VALVE AV Area (Vmax):    1.48 cm AV Area (Vmean):   1.46 cm AV Area (VTI):     1.49 cm AV Vmax:           180.00 cm/s AV Vmean:          132.000 cm/s AV VTI:            0.362 m AV Peak Grad:      13.0 mmHg AV Mean Grad:      8.0 mmHg LVOT Vmax:         84.90 cm/s LVOT Vmean:        61.400 cm/s LVOT VTI:          0.172 m LVOT/AV VTI ratio: 0.48  AORTA Ao Root diam: 3.90 cm MITRAL VALVE                TRICUSPID VALVE MV Area (PHT): 3.37 cm  TR Peak grad:   11.3 mmHg MV Decel Time: 225 msec    TR Vmax:        168.00 cm/s MV E velocity: 79.30 cm/s MV A velocity: 49.30 cm/s  SHUNTS MV E/A ratio:  1.61        Systemic VTI:  0.17 m                            Systemic Diam: 2.00 cm Eleonore Chiquito MD Electronically signed by Eleonore Chiquito MD Signature Date/Time: 11/22/2019/5:49:13 PM    Final     Microbiology Recent Results (from the past 240 hour(s))  Respiratory Panel by RT PCR (Flu A&B, Covid) - Nasopharyngeal Swab     Status: None   Collection Time: 12/10/2019  6:20 AM   Specimen: Nasopharyngeal Swab  Result Value Ref Range Status   SARS Coronavirus 2 by RT PCR NEGATIVE NEGATIVE Final    Comment: (NOTE) SARS-CoV-2 target nucleic acids are NOT DETECTED. The SARS-CoV-2 RNA is generally detectable in upper respiratoy specimens during the acute phase of infection. The lowest concentration of SARS-CoV-2 viral copies this assay can detect is 131 copies/mL. A negative result does not preclude SARS-Cov-2 infection and should not be used as the sole basis for treatment or other patient management decisions. A negative result may occur with  improper specimen collection/handling, submission of specimen other than nasopharyngeal swab, presence of viral mutation(s) within the areas targeted by this assay, and inadequate number of viral copies (<131 copies/mL). A negative result must be combined with clinical observations, patient history, and epidemiological information. The expected result is Negative. Fact Sheet for Patients:  PinkCheek.be Fact Sheet for Healthcare Providers:  GravelBags.it This test is not yet ap proved or cleared by the [place] FDA and  has been authorized for detection and/or diagnosis of SARS-CoV-2 by FDA under an Emergency Use Authorization (EUA). This EUA will remain  in effect (meaning this test can be used)  for the duration of the COVID-19 declaration under Section 564(b)(1) of the Act, 21 U.S.C. section 360bbb-3(b)(1), unless the authorization is terminated or revoked sooner.    Influenza A by PCR NEGATIVE NEGATIVE Final   Influenza B by PCR NEGATIVE NEGATIVE Final    Comment: (NOTE) The Xpert Xpress SARS-CoV-2/FLU/RSV assay is intended as an aid in  the diagnosis of influenza from Nasopharyngeal swab specimens and  should not be used as a sole basis for treatment. Nasal washings and  aspirates are unacceptable for Xpert Xpress SARS-CoV-2/FLU/RSV  testing. Fact Sheet for Patients: PinkCheek.be Fact Sheet for Healthcare Providers: GravelBags.it This test is not yet approved or cleared by the [place] FDA and  has been authorized for detection and/or diagnosis of SARS-CoV-2 by  FDA under an Emergency Use Authorization (EUA). This EUA will remain  in effect (meaning this test can be used) for the duration of the  Covid-19 declaration under Section 564(b)(1) of the Act, 21  U.S.C. section 360bbb-3(b)(1), unless the authorization is  terminated or revoked. Performed at [facility], [place] [address], [place], [place] [address]     Lab Basic Metabolic Panel: Recent Labs  Lab 12/25/2019 0610 12/03/2019 0630 12/27/2019 0708  NA 139 141 142  K 3.7 3.4* 3.5  CL 106 107  --   CO2 20*  --   --   GLUCOSE 161* 154*  --   BUN 22 25*  --   CREATININE 1.30* 1.20  --  CALCIUM 9.0  --   --    Liver Function Tests: Recent Labs  Lab 12/04/2019 0610  AST 29  ALT 19  ALKPHOS 118  BILITOT 0.5  PROT 7.7  ALBUMIN 3.3*   No results for input(s): LIPASE, AMYLASE in the last 168 hours. No results for input(s): AMMONIA in the last 168 hours. CBC: Recent Labs  Lab 12/25/2019 0610 12/12/2019 0630 12/03/2019 0708  WBC 6.7  --   --   NEUTROABS 4.0  --   --   HGB 13.2 13.9 13.3  HCT 44.1 41.0 39.0  MCV 72.5*  --   --   PLT 192  --    --    Cardiac Enzymes: No results for input(s): CKTOTAL, CKMB, CKMBINDEX, TROPONINI in the last 168 hours. Sepsis Labs: Recent Labs  Lab 12/24/2019 0610  WBC 6.7    Procedures/Operations     Barb Merino 12/12/2019, 2:43 PM

## 2020-01-02 DEATH — deceased
